# Patient Record
Sex: Female | Born: 1945 | Race: White | Hispanic: No | Smoking: Former smoker
Health system: Southern US, Community
[De-identification: ages and names within clinical notes are randomized; demographics above are authoritative.]

## PROBLEM LIST (undated history)

## (undated) DIAGNOSIS — M1990 Unspecified osteoarthritis, unspecified site: Secondary | ICD-10-CM

## (undated) DIAGNOSIS — I10 Essential (primary) hypertension: Secondary | ICD-10-CM

## (undated) DIAGNOSIS — K589 Irritable bowel syndrome without diarrhea: Secondary | ICD-10-CM

## (undated) DIAGNOSIS — K5790 Diverticulosis of intestine, part unspecified, without perforation or abscess without bleeding: Secondary | ICD-10-CM

## (undated) DIAGNOSIS — F32A Depression, unspecified: Secondary | ICD-10-CM

## (undated) DIAGNOSIS — D126 Benign neoplasm of colon, unspecified: Secondary | ICD-10-CM

## (undated) DIAGNOSIS — F419 Anxiety disorder, unspecified: Secondary | ICD-10-CM

## (undated) DIAGNOSIS — H269 Unspecified cataract: Secondary | ICD-10-CM

## (undated) DIAGNOSIS — E785 Hyperlipidemia, unspecified: Secondary | ICD-10-CM

## (undated) DIAGNOSIS — K219 Gastro-esophageal reflux disease without esophagitis: Secondary | ICD-10-CM

## (undated) DIAGNOSIS — E041 Nontoxic single thyroid nodule: Secondary | ICD-10-CM

## (undated) DIAGNOSIS — D649 Anemia, unspecified: Secondary | ICD-10-CM

## (undated) DIAGNOSIS — K760 Fatty (change of) liver, not elsewhere classified: Secondary | ICD-10-CM

## (undated) DIAGNOSIS — G4730 Sleep apnea, unspecified: Secondary | ICD-10-CM

## (undated) DIAGNOSIS — G2581 Restless legs syndrome: Secondary | ICD-10-CM

## (undated) DIAGNOSIS — R413 Other amnesia: Secondary | ICD-10-CM

## (undated) DIAGNOSIS — F329 Major depressive disorder, single episode, unspecified: Secondary | ICD-10-CM

## (undated) DIAGNOSIS — H18519 Endothelial corneal dystrophy, unspecified eye: Secondary | ICD-10-CM

## (undated) DIAGNOSIS — Z8719 Personal history of other diseases of the digestive system: Secondary | ICD-10-CM

## (undated) DIAGNOSIS — M8580 Other specified disorders of bone density and structure, unspecified site: Secondary | ICD-10-CM

## (undated) HISTORY — DX: Irritable bowel syndrome, unspecified: K58.9

## (undated) HISTORY — DX: Depression, unspecified: F32.A

## (undated) HISTORY — DX: Major depressive disorder, single episode, unspecified: F32.9

## (undated) HISTORY — DX: Nontoxic single thyroid nodule: E04.1

## (undated) HISTORY — PX: POLYPECTOMY: SHX149

## (undated) HISTORY — DX: Other amnesia: R41.3

## (undated) HISTORY — DX: Endothelial corneal dystrophy, unspecified eye: H18.519

## (undated) HISTORY — DX: Gastro-esophageal reflux disease without esophagitis: K21.9

## (undated) HISTORY — DX: Anxiety disorder, unspecified: F41.9

## (undated) HISTORY — DX: Hyperlipidemia, unspecified: E78.5

## (undated) HISTORY — DX: Anemia, unspecified: D64.9

## (undated) HISTORY — DX: Restless legs syndrome: G25.81

## (undated) HISTORY — PX: UTERINE FIBROID EMBOLIZATION: SHX825

## (undated) HISTORY — PX: JOINT REPLACEMENT: SHX530

## (undated) HISTORY — DX: Sleep apnea, unspecified: G47.30

## (undated) HISTORY — DX: Unspecified cataract: H26.9

## (undated) HISTORY — DX: Other specified disorders of bone density and structure, unspecified site: M85.80

## (undated) HISTORY — PX: TONSILLECTOMY: SUR1361

## (undated) HISTORY — DX: Diverticulosis of intestine, part unspecified, without perforation or abscess without bleeding: K57.90

## (undated) HISTORY — DX: Essential (primary) hypertension: I10

## (undated) HISTORY — DX: Fatty (change of) liver, not elsewhere classified: K76.0

## (undated) HISTORY — DX: Benign neoplasm of colon, unspecified: D12.6

---

## 1988-01-08 DIAGNOSIS — D126 Benign neoplasm of colon, unspecified: Secondary | ICD-10-CM

## 1988-01-08 HISTORY — DX: Benign neoplasm of colon, unspecified: D12.6

## 1997-11-03 ENCOUNTER — Encounter: Payer: Self-pay | Admitting: Emergency Medicine

## 1997-11-03 ENCOUNTER — Emergency Department (HOSPITAL_COMMUNITY): Admission: EM | Admit: 1997-11-03 | Discharge: 1997-11-03 | Payer: Self-pay | Admitting: Emergency Medicine

## 1998-08-18 ENCOUNTER — Ambulatory Visit (HOSPITAL_COMMUNITY): Admission: RE | Admit: 1998-08-18 | Discharge: 1998-08-18 | Payer: Self-pay | Admitting: Gastroenterology

## 1998-08-18 ENCOUNTER — Encounter: Payer: Self-pay | Admitting: Gastroenterology

## 1999-05-03 ENCOUNTER — Other Ambulatory Visit: Admission: RE | Admit: 1999-05-03 | Discharge: 1999-05-03 | Payer: Self-pay | Admitting: *Deleted

## 2000-11-04 ENCOUNTER — Encounter (INDEPENDENT_AMBULATORY_CARE_PROVIDER_SITE_OTHER): Payer: Self-pay | Admitting: Specialist

## 2000-11-04 ENCOUNTER — Other Ambulatory Visit: Admission: RE | Admit: 2000-11-04 | Discharge: 2000-11-04 | Payer: Self-pay | Admitting: Gastroenterology

## 2000-11-20 ENCOUNTER — Encounter: Payer: Self-pay | Admitting: Gastroenterology

## 2000-11-20 ENCOUNTER — Ambulatory Visit (HOSPITAL_COMMUNITY): Admission: RE | Admit: 2000-11-20 | Discharge: 2000-11-20 | Payer: Self-pay | Admitting: Gastroenterology

## 2002-03-25 ENCOUNTER — Other Ambulatory Visit: Admission: RE | Admit: 2002-03-25 | Discharge: 2002-03-25 | Payer: Self-pay | Admitting: *Deleted

## 2004-02-02 ENCOUNTER — Ambulatory Visit: Payer: Self-pay | Admitting: Cardiology

## 2004-02-14 ENCOUNTER — Ambulatory Visit: Payer: Self-pay

## 2004-02-14 ENCOUNTER — Ambulatory Visit: Payer: Self-pay | Admitting: Cardiology

## 2004-02-14 ENCOUNTER — Ambulatory Visit: Payer: Self-pay | Admitting: Gastroenterology

## 2004-03-14 ENCOUNTER — Ambulatory Visit: Payer: Self-pay | Admitting: Gastroenterology

## 2004-03-19 ENCOUNTER — Ambulatory Visit: Payer: Self-pay | Admitting: Cardiology

## 2004-03-23 ENCOUNTER — Ambulatory Visit: Payer: Self-pay | Admitting: Cardiology

## 2004-04-06 ENCOUNTER — Ambulatory Visit: Payer: Self-pay | Admitting: Cardiology

## 2004-05-22 ENCOUNTER — Ambulatory Visit: Payer: Self-pay | Admitting: Cardiology

## 2004-09-27 ENCOUNTER — Ambulatory Visit: Payer: Self-pay | Admitting: Pulmonary Disease

## 2004-09-27 ENCOUNTER — Ambulatory Visit: Payer: Self-pay | Admitting: Cardiology

## 2004-10-08 ENCOUNTER — Ambulatory Visit: Payer: Self-pay | Admitting: Cardiology

## 2004-11-03 ENCOUNTER — Ambulatory Visit (HOSPITAL_BASED_OUTPATIENT_CLINIC_OR_DEPARTMENT_OTHER): Admission: RE | Admit: 2004-11-03 | Discharge: 2004-11-03 | Payer: Self-pay | Admitting: Pulmonary Disease

## 2004-11-08 ENCOUNTER — Ambulatory Visit: Payer: Self-pay | Admitting: Pulmonary Disease

## 2004-11-21 ENCOUNTER — Ambulatory Visit: Payer: Self-pay | Admitting: Pulmonary Disease

## 2004-12-10 ENCOUNTER — Ambulatory Visit (HOSPITAL_BASED_OUTPATIENT_CLINIC_OR_DEPARTMENT_OTHER): Admission: RE | Admit: 2004-12-10 | Discharge: 2004-12-10 | Payer: Self-pay | Admitting: Surgery

## 2004-12-10 ENCOUNTER — Encounter (INDEPENDENT_AMBULATORY_CARE_PROVIDER_SITE_OTHER): Payer: Self-pay | Admitting: *Deleted

## 2005-07-04 ENCOUNTER — Ambulatory Visit: Payer: Self-pay | Admitting: Gastroenterology

## 2005-09-04 ENCOUNTER — Encounter: Admission: RE | Admit: 2005-09-04 | Discharge: 2005-09-04 | Payer: Self-pay | Admitting: Family Medicine

## 2005-09-30 ENCOUNTER — Ambulatory Visit: Payer: Self-pay | Admitting: Internal Medicine

## 2005-10-23 ENCOUNTER — Ambulatory Visit: Payer: Self-pay | Admitting: Gastroenterology

## 2005-11-04 ENCOUNTER — Ambulatory Visit: Payer: Self-pay | Admitting: Internal Medicine

## 2006-01-07 LAB — CONVERTED CEMR LAB: Pap Smear: NORMAL

## 2006-01-29 LAB — HM MAMMOGRAPHY: HM Mammogram: NORMAL

## 2006-01-31 ENCOUNTER — Ambulatory Visit: Payer: Self-pay | Admitting: Internal Medicine

## 2006-02-17 ENCOUNTER — Ambulatory Visit: Payer: Self-pay | Admitting: Internal Medicine

## 2006-02-17 LAB — CONVERTED CEMR LAB
ALT: 35 units/L (ref 0–40)
AST: 31 units/L (ref 0–37)
Albumin: 3.8 g/dL (ref 3.5–5.2)
Alkaline Phosphatase: 63 units/L (ref 39–117)
BUN: 7 mg/dL (ref 6–23)
Basophils Absolute: 0 10*3/uL (ref 0.0–0.1)
Basophils Relative: 0.5 % (ref 0.0–1.0)
CO2: 28 meq/L (ref 19–32)
Calcium: 9.1 mg/dL (ref 8.4–10.5)
Chloride: 101 meq/L (ref 96–112)
Cholesterol: 207 mg/dL (ref 0–200)
Creatinine, Ser: 0.7 mg/dL (ref 0.4–1.2)
Direct LDL: 134.8 mg/dL
Eosinophils Absolute: 0.2 10*3/uL (ref 0.0–0.6)
Eosinophils Relative: 3.4 % (ref 0.0–5.0)
GFR calc Af Amer: 109 mL/min
GFR calc non Af Amer: 90 mL/min
Glucose, Bld: 155 mg/dL — ABNORMAL HIGH (ref 70–99)
HCT: 39.7 % (ref 36.0–46.0)
HDL: 42.7 mg/dL (ref >39.0)
Hemoglobin: 13.7 g/dL (ref 12.0–15.0)
Lymphocytes Relative: 36.1 % (ref 12.0–46.0)
MCHC: 34.5 g/dL (ref 30.0–36.0)
MCV: 83 fL (ref 78.0–100.0)
Monocytes Absolute: 0.4 10*3/uL (ref 0.2–0.7)
Monocytes Relative: 5.6 % (ref 3.0–11.0)
Neutro Abs: 3.4 10*3/uL (ref 1.4–7.7)
Neutrophils Relative %: 54.4 % (ref 43.0–77.0)
Platelets: 228 10*3/uL (ref 150–400)
Potassium: 4.4 meq/L (ref 3.5–5.1)
RBC: 4.78 M/uL (ref 3.87–5.11)
RDW: 13.3 % (ref 11.5–14.6)
Sodium: 136 meq/L (ref 135–145)
Total Bilirubin: 0.7 mg/dL (ref 0.3–1.2)
Total CHOL/HDL Ratio: 4.8
Total Protein: 7.4 g/dL (ref 6.0–8.3)
Triglycerides: 196 mg/dL — ABNORMAL HIGH (ref 0–149)
VLDL: 39 mg/dL (ref 0–40)
WBC: 6.3 10*3/uL (ref 4.5–10.5)

## 2006-03-03 ENCOUNTER — Ambulatory Visit: Payer: Self-pay | Admitting: Internal Medicine

## 2006-03-03 LAB — CONVERTED CEMR LAB: Hgb A1c MFr Bld: 7.9 % — ABNORMAL HIGH (ref 4.6–6.0)

## 2006-05-01 ENCOUNTER — Ambulatory Visit: Payer: Self-pay | Admitting: Internal Medicine

## 2006-05-19 DIAGNOSIS — F32A Depression, unspecified: Secondary | ICD-10-CM | POA: Insufficient documentation

## 2006-05-19 DIAGNOSIS — E119 Type 2 diabetes mellitus without complications: Secondary | ICD-10-CM | POA: Insufficient documentation

## 2006-05-19 DIAGNOSIS — G2581 Restless legs syndrome: Secondary | ICD-10-CM | POA: Insufficient documentation

## 2006-05-19 DIAGNOSIS — I10 Essential (primary) hypertension: Secondary | ICD-10-CM | POA: Insufficient documentation

## 2006-05-19 DIAGNOSIS — F329 Major depressive disorder, single episode, unspecified: Secondary | ICD-10-CM

## 2006-05-19 DIAGNOSIS — E785 Hyperlipidemia, unspecified: Secondary | ICD-10-CM | POA: Insufficient documentation

## 2006-05-19 DIAGNOSIS — K589 Irritable bowel syndrome without diarrhea: Secondary | ICD-10-CM | POA: Insufficient documentation

## 2006-05-29 ENCOUNTER — Ambulatory Visit: Payer: Self-pay | Admitting: Internal Medicine

## 2006-05-29 LAB — CONVERTED CEMR LAB: Hgb A1c MFr Bld: 7.2 % — ABNORMAL HIGH (ref 4.6–6.0)

## 2006-06-04 ENCOUNTER — Encounter: Payer: Self-pay | Admitting: Internal Medicine

## 2006-08-22 ENCOUNTER — Ambulatory Visit: Payer: Self-pay | Admitting: Internal Medicine

## 2006-08-22 DIAGNOSIS — D126 Benign neoplasm of colon, unspecified: Secondary | ICD-10-CM | POA: Insufficient documentation

## 2006-10-16 ENCOUNTER — Telehealth (INDEPENDENT_AMBULATORY_CARE_PROVIDER_SITE_OTHER): Payer: Self-pay | Admitting: *Deleted

## 2006-10-17 ENCOUNTER — Ambulatory Visit: Payer: Self-pay | Admitting: Internal Medicine

## 2006-10-17 DIAGNOSIS — B9789 Other viral agents as the cause of diseases classified elsewhere: Secondary | ICD-10-CM | POA: Insufficient documentation

## 2006-10-17 LAB — CONVERTED CEMR LAB
Bilirubin Urine: NEGATIVE
Blood in Urine, dipstick: NEGATIVE
Glucose, Urine, Semiquant: NEGATIVE
Ketones, urine, test strip: NEGATIVE
Nitrite: NEGATIVE
Protein, U semiquant: NEGATIVE
Specific Gravity, Urine: 1.015
Urobilinogen, UA: NEGATIVE
WBC Urine, dipstick: NEGATIVE
pH: 5

## 2006-10-20 ENCOUNTER — Encounter (INDEPENDENT_AMBULATORY_CARE_PROVIDER_SITE_OTHER): Payer: Self-pay | Admitting: *Deleted

## 2006-10-21 ENCOUNTER — Telehealth (INDEPENDENT_AMBULATORY_CARE_PROVIDER_SITE_OTHER): Payer: Self-pay | Admitting: *Deleted

## 2006-10-24 ENCOUNTER — Ambulatory Visit: Payer: Self-pay | Admitting: Internal Medicine

## 2006-11-04 ENCOUNTER — Encounter: Payer: Self-pay | Admitting: Internal Medicine

## 2006-11-10 ENCOUNTER — Telehealth (INDEPENDENT_AMBULATORY_CARE_PROVIDER_SITE_OTHER): Payer: Self-pay | Admitting: *Deleted

## 2006-11-10 LAB — CONVERTED CEMR LAB
ALT: 39 units/L — ABNORMAL HIGH (ref 0–35)
AST: 29 units/L (ref 0–37)
Albumin: 4.5 g/dL (ref 3.5–5.2)
Alkaline Phosphatase: 59 units/L (ref 39–117)
BUN: 8 mg/dL (ref 6–23)
Basophils Absolute: 0 10*3/uL (ref 0.0–0.1)
Basophils Relative: 0 % (ref 0–1)
Bilirubin, Direct: 0.1 mg/dL (ref 0.0–0.3)
Creatinine, Ser: 0.73 mg/dL (ref 0.40–1.20)
Eosinophils Absolute: 0.2 10*3/uL (ref 0.0–0.7)
Eosinophils Relative: 3 % (ref 0–5)
HCT: 42 % (ref 36.0–46.0)
Hemoglobin: 14 g/dL (ref 12.0–15.0)
Hgb A1c MFr Bld: 6.6 % — ABNORMAL HIGH (ref 4.6–6.1)
Indirect Bilirubin: 0.4 mg/dL (ref 0.0–0.9)
Lymphocytes Relative: 40 % (ref 12–46)
Lymphs Abs: 2.8 10*3/uL (ref 0.7–3.3)
MCHC: 33.3 g/dL (ref 30.0–36.0)
MCV: 84.5 fL (ref 78.0–100.0)
Monocytes Absolute: 0.3 10*3/uL (ref 0.2–0.7)
Monocytes Relative: 5 % (ref 3–11)
Neutro Abs: 3.6 10*3/uL (ref 1.7–7.7)
Neutrophils Relative %: 52 % (ref 43–77)
Platelets: 252 10*3/uL (ref 150–400)
RBC: 4.97 M/uL (ref 3.87–5.11)
RDW: 13.5 % (ref 11.5–14.0)
Sed Rate: 22 mm/hr (ref 0–22)
Total Bilirubin: 0.5 mg/dL (ref 0.3–1.2)
Total CK: 55 units/L (ref 7–177)
Total Protein: 7.7 g/dL (ref 6.0–8.3)
WBC: 6.9 10*3/uL (ref 4.0–10.5)

## 2006-12-01 ENCOUNTER — Telehealth (INDEPENDENT_AMBULATORY_CARE_PROVIDER_SITE_OTHER): Payer: Self-pay | Admitting: *Deleted

## 2007-03-10 ENCOUNTER — Telehealth (INDEPENDENT_AMBULATORY_CARE_PROVIDER_SITE_OTHER): Payer: Self-pay | Admitting: *Deleted

## 2007-04-06 ENCOUNTER — Ambulatory Visit: Payer: Self-pay | Admitting: Internal Medicine

## 2007-04-13 ENCOUNTER — Encounter (INDEPENDENT_AMBULATORY_CARE_PROVIDER_SITE_OTHER): Payer: Self-pay | Admitting: *Deleted

## 2007-04-13 LAB — CONVERTED CEMR LAB
ALT: 45 units/L — ABNORMAL HIGH (ref 0–35)
AST: 35 units/L (ref 0–37)
BUN: 7 mg/dL (ref 6–23)
CO2: 29 meq/L (ref 19–32)
Calcium: 9.2 mg/dL (ref 8.4–10.5)
Chloride: 104 meq/L (ref 96–112)
Creatinine, Ser: 0.7 mg/dL (ref 0.4–1.2)
Creatinine,U: 32.6 mg/dL
GFR calc Af Amer: 109 mL/min
GFR calc non Af Amer: 90 mL/min
Glucose, Bld: 119 mg/dL — ABNORMAL HIGH (ref 70–99)
Hgb A1c MFr Bld: 7.2 % — ABNORMAL HIGH (ref 4.6–6.0)
Microalb Creat Ratio: 46 mg/g — ABNORMAL HIGH (ref 0.0–30.0)
Microalb, Ur: 1.5 mg/dL (ref 0.0–1.9)
Potassium: 4.2 meq/L (ref 3.5–5.1)
Sodium: 140 meq/L (ref 135–145)

## 2007-05-20 ENCOUNTER — Telehealth (INDEPENDENT_AMBULATORY_CARE_PROVIDER_SITE_OTHER): Payer: Self-pay | Admitting: *Deleted

## 2007-06-05 ENCOUNTER — Encounter: Payer: Self-pay | Admitting: Internal Medicine

## 2007-06-09 ENCOUNTER — Ambulatory Visit: Payer: Self-pay | Admitting: Internal Medicine

## 2007-06-10 ENCOUNTER — Telehealth (INDEPENDENT_AMBULATORY_CARE_PROVIDER_SITE_OTHER): Payer: Self-pay | Admitting: *Deleted

## 2007-06-11 ENCOUNTER — Encounter (INDEPENDENT_AMBULATORY_CARE_PROVIDER_SITE_OTHER): Payer: Self-pay | Admitting: *Deleted

## 2007-06-11 LAB — CONVERTED CEMR LAB
ALT: 37 units/L — ABNORMAL HIGH (ref 0–35)
AST: 34 units/L (ref 0–37)
Albumin: 3.8 g/dL (ref 3.5–5.2)
Alkaline Phosphatase: 58 units/L (ref 39–117)
Bilirubin, Direct: 0.1 mg/dL (ref 0.0–0.3)
Cholesterol: 215 mg/dL (ref 0–200)
Direct LDL: 132.3 mg/dL
HDL: 43.4 mg/dL (ref >39.0)
Hgb A1c MFr Bld: 6.8 % — ABNORMAL HIGH (ref 4.6–6.0)
TSH: 0.63 microintl units/mL (ref 0.35–5.50)
Total Bilirubin: 1.1 mg/dL (ref 0.3–1.2)
Total CHOL/HDL Ratio: 5
Total Protein: 7.5 g/dL (ref 6.0–8.3)
Triglycerides: 186 mg/dL — ABNORMAL HIGH (ref 0–149)
VLDL: 37 mg/dL (ref 0–40)

## 2007-06-26 ENCOUNTER — Ambulatory Visit: Payer: Self-pay | Admitting: Internal Medicine

## 2007-06-26 ENCOUNTER — Encounter: Payer: Self-pay | Admitting: Internal Medicine

## 2007-07-27 ENCOUNTER — Telehealth (INDEPENDENT_AMBULATORY_CARE_PROVIDER_SITE_OTHER): Payer: Self-pay | Admitting: *Deleted

## 2007-08-21 ENCOUNTER — Telehealth (INDEPENDENT_AMBULATORY_CARE_PROVIDER_SITE_OTHER): Payer: Self-pay | Admitting: *Deleted

## 2007-08-24 ENCOUNTER — Encounter: Payer: Self-pay | Admitting: Internal Medicine

## 2007-10-09 ENCOUNTER — Ambulatory Visit: Payer: Self-pay | Admitting: Internal Medicine

## 2007-10-09 DIAGNOSIS — M8580 Other specified disorders of bone density and structure, unspecified site: Secondary | ICD-10-CM | POA: Insufficient documentation

## 2007-10-15 ENCOUNTER — Encounter (INDEPENDENT_AMBULATORY_CARE_PROVIDER_SITE_OTHER): Payer: Self-pay | Admitting: *Deleted

## 2007-10-15 LAB — CONVERTED CEMR LAB
ALT: 42 units/L — ABNORMAL HIGH (ref 0–35)
AST: 36 units/L (ref 0–37)
BUN: 10 mg/dL (ref 6–23)
CO2: 29 meq/L (ref 19–32)
Calcium: 8.8 mg/dL (ref 8.4–10.5)
Chloride: 103 meq/L (ref 96–112)
Cholesterol: 135 mg/dL (ref 0–200)
Creatinine, Ser: 0.8 mg/dL (ref 0.4–1.2)
GFR calc Af Amer: 93 mL/min
GFR calc non Af Amer: 77 mL/min
Glucose, Bld: 135 mg/dL — ABNORMAL HIGH (ref 70–99)
HDL: 42 mg/dL (ref >39.0)
Hgb A1c MFr Bld: 7 % — ABNORMAL HIGH (ref 4.6–6.0)
LDL Cholesterol: 69 mg/dL (ref 0–99)
Potassium: 4.4 meq/L (ref 3.5–5.1)
Sodium: 139 meq/L (ref 135–145)
Total CHOL/HDL Ratio: 3.2
Triglycerides: 118 mg/dL (ref 0–149)
VLDL: 24 mg/dL (ref 0–40)

## 2007-11-24 ENCOUNTER — Ambulatory Visit: Payer: Self-pay | Admitting: Internal Medicine

## 2007-11-24 LAB — CONVERTED CEMR LAB
Bilirubin Urine: NEGATIVE
Glucose, Urine, Semiquant: NEGATIVE
Ketones, urine, test strip: NEGATIVE
Nitrite: NEGATIVE
Protein, U semiquant: NEGATIVE
Specific Gravity, Urine: 1.01
Urobilinogen, UA: 0.2
pH: 5

## 2007-11-25 ENCOUNTER — Encounter: Payer: Self-pay | Admitting: Internal Medicine

## 2007-11-25 LAB — CONVERTED CEMR LAB: RBC / HPF: NONE SEEN (ref <3)

## 2007-11-26 ENCOUNTER — Encounter: Payer: Self-pay | Admitting: Internal Medicine

## 2007-12-01 ENCOUNTER — Telehealth (INDEPENDENT_AMBULATORY_CARE_PROVIDER_SITE_OTHER): Payer: Self-pay | Admitting: *Deleted

## 2008-02-16 ENCOUNTER — Ambulatory Visit: Payer: Self-pay | Admitting: Internal Medicine

## 2008-02-19 ENCOUNTER — Telehealth (INDEPENDENT_AMBULATORY_CARE_PROVIDER_SITE_OTHER): Payer: Self-pay | Admitting: *Deleted

## 2008-02-19 LAB — CONVERTED CEMR LAB
ALT: 39 units/L — ABNORMAL HIGH (ref 0–35)
AST: 40 units/L — ABNORMAL HIGH (ref 0–37)
Albumin: 4 g/dL (ref 3.5–5.2)
Alkaline Phosphatase: 48 units/L (ref 39–117)
BUN: 8 mg/dL (ref 6–23)
Basophils Absolute: 0.1 10*3/uL (ref 0.0–0.1)
Basophils Relative: 0.9 % (ref 0.0–3.0)
Bilirubin, Direct: 0.1 mg/dL (ref 0.0–0.3)
CO2: 30 meq/L (ref 19–32)
Calcium: 9.5 mg/dL (ref 8.4–10.5)
Chloride: 101 meq/L (ref 96–112)
Creatinine, Ser: 0.8 mg/dL (ref 0.4–1.2)
Creatinine,U: 52.7 mg/dL
Eosinophils Absolute: 0.3 10*3/uL (ref 0.0–0.7)
Eosinophils Relative: 3.3 % (ref 0.0–5.0)
GFR calc Af Amer: 93 mL/min
GFR calc non Af Amer: 77 mL/min
Glucose, Bld: 132 mg/dL — ABNORMAL HIGH (ref 70–99)
HCT: 37.1 % (ref 36.0–46.0)
Hemoglobin: 12.7 g/dL (ref 12.0–15.0)
Hgb A1c MFr Bld: 7.2 % — ABNORMAL HIGH (ref 4.6–6.0)
Lymphocytes Relative: 31.9 % (ref 12.0–46.0)
MCHC: 34.1 g/dL (ref 30.0–36.0)
MCV: 84.1 fL (ref 78.0–100.0)
Microalb Creat Ratio: 7.6 mg/g (ref 0.0–30.0)
Microalb, Ur: 0.4 mg/dL (ref 0.0–1.9)
Monocytes Absolute: 0.4 10*3/uL (ref 0.1–1.0)
Monocytes Relative: 5 % (ref 3.0–12.0)
Neutro Abs: 4.5 10*3/uL (ref 1.4–7.7)
Neutrophils Relative %: 58.9 % (ref 43.0–77.0)
Platelets: 215 10*3/uL (ref 150–400)
Potassium: 4.7 meq/L (ref 3.5–5.1)
RBC: 4.41 M/uL (ref 3.87–5.11)
RDW: 13 % (ref 11.5–14.6)
Sodium: 136 meq/L (ref 135–145)
Total Bilirubin: 0.8 mg/dL (ref 0.3–1.2)
Total Protein: 7.6 g/dL (ref 6.0–8.3)
WBC: 7.8 10*3/uL (ref 4.5–10.5)

## 2008-03-07 ENCOUNTER — Telehealth (INDEPENDENT_AMBULATORY_CARE_PROVIDER_SITE_OTHER): Payer: Self-pay | Admitting: *Deleted

## 2008-03-24 ENCOUNTER — Ambulatory Visit: Payer: Self-pay | Admitting: Internal Medicine

## 2008-06-17 ENCOUNTER — Ambulatory Visit: Payer: Self-pay | Admitting: Internal Medicine

## 2008-06-23 ENCOUNTER — Encounter (INDEPENDENT_AMBULATORY_CARE_PROVIDER_SITE_OTHER): Payer: Self-pay | Admitting: *Deleted

## 2008-06-23 LAB — CONVERTED CEMR LAB
ALT: 36 units/L — ABNORMAL HIGH (ref 0–35)
AST: 30 units/L (ref 0–37)
Hgb A1c MFr Bld: 6.9 % — ABNORMAL HIGH (ref 4.6–6.1)

## 2008-08-25 ENCOUNTER — Encounter: Payer: Self-pay | Admitting: Internal Medicine

## 2008-09-05 ENCOUNTER — Telehealth (INDEPENDENT_AMBULATORY_CARE_PROVIDER_SITE_OTHER): Payer: Self-pay | Admitting: *Deleted

## 2008-10-03 ENCOUNTER — Encounter: Payer: Self-pay | Admitting: Internal Medicine

## 2008-10-21 ENCOUNTER — Ambulatory Visit: Payer: Self-pay | Admitting: Family Medicine

## 2008-11-28 ENCOUNTER — Encounter (INDEPENDENT_AMBULATORY_CARE_PROVIDER_SITE_OTHER): Payer: Self-pay | Admitting: *Deleted

## 2008-12-07 ENCOUNTER — Ambulatory Visit: Payer: Self-pay | Admitting: Internal Medicine

## 2008-12-07 LAB — CONVERTED CEMR LAB
Bilirubin Urine: NEGATIVE
Glucose, Urine, Semiquant: NEGATIVE
Ketones, urine, test strip: NEGATIVE
Nitrite: POSITIVE
Protein, U semiquant: NEGATIVE
Urobilinogen, UA: 0.2
pH: 7

## 2008-12-26 ENCOUNTER — Telehealth (INDEPENDENT_AMBULATORY_CARE_PROVIDER_SITE_OTHER): Payer: Self-pay | Admitting: *Deleted

## 2009-01-24 ENCOUNTER — Ambulatory Visit: Payer: Self-pay | Admitting: Internal Medicine

## 2009-02-01 ENCOUNTER — Ambulatory Visit: Payer: Self-pay | Admitting: Internal Medicine

## 2009-02-01 LAB — CONVERTED CEMR LAB
Bilirubin Urine: NEGATIVE
Blood in Urine, dipstick: NEGATIVE
Glucose, Urine, Semiquant: NEGATIVE
Ketones, urine, test strip: NEGATIVE
Nitrite: NEGATIVE
Protein, U semiquant: NEGATIVE
Specific Gravity, Urine: 1.01
Urobilinogen, UA: 0.2
WBC Urine, dipstick: NEGATIVE
pH: 6

## 2009-02-03 LAB — CONVERTED CEMR LAB
ALT: 29 units/L (ref 0–35)
AST: 27 units/L (ref 0–37)
BUN: 14 mg/dL (ref 6–23)
CO2: 27 meq/L (ref 19–32)
Calcium: 9.4 mg/dL (ref 8.4–10.5)
Chloride: 102 meq/L (ref 96–112)
Cholesterol: 145 mg/dL (ref 0–200)
Creatinine, Ser: 0.77 mg/dL (ref 0.40–1.20)
Glucose, Bld: 168 mg/dL — ABNORMAL HIGH (ref 70–99)
HDL: 47 mg/dL (ref >39)
Hgb A1c MFr Bld: 6.8 % — ABNORMAL HIGH (ref 4.6–6.1)
LDL Cholesterol: 62 mg/dL (ref 0–99)
Microalb, Ur: 0.5 mg/dL (ref 0.00–1.89)
Potassium: 4.7 meq/L (ref 3.5–5.3)
Sodium: 139 meq/L (ref 135–145)
Total CHOL/HDL Ratio: 3.1
Triglycerides: 182 mg/dL — ABNORMAL HIGH (ref <150)
VLDL: 36 mg/dL (ref 0–40)

## 2009-02-16 ENCOUNTER — Encounter (INDEPENDENT_AMBULATORY_CARE_PROVIDER_SITE_OTHER): Payer: Self-pay | Admitting: *Deleted

## 2009-04-03 ENCOUNTER — Encounter (INDEPENDENT_AMBULATORY_CARE_PROVIDER_SITE_OTHER): Payer: Self-pay | Admitting: *Deleted

## 2009-04-04 ENCOUNTER — Ambulatory Visit: Payer: Self-pay | Admitting: Gastroenterology

## 2009-04-04 ENCOUNTER — Encounter (INDEPENDENT_AMBULATORY_CARE_PROVIDER_SITE_OTHER): Payer: Self-pay | Admitting: *Deleted

## 2009-04-05 ENCOUNTER — Telehealth (INDEPENDENT_AMBULATORY_CARE_PROVIDER_SITE_OTHER): Payer: Self-pay | Admitting: *Deleted

## 2009-04-18 ENCOUNTER — Ambulatory Visit: Payer: Self-pay | Admitting: Gastroenterology

## 2009-04-18 LAB — HM COLONOSCOPY

## 2009-04-19 ENCOUNTER — Telehealth: Payer: Self-pay | Admitting: Internal Medicine

## 2009-04-20 ENCOUNTER — Encounter: Payer: Self-pay | Admitting: Gastroenterology

## 2009-05-24 ENCOUNTER — Ambulatory Visit: Payer: Self-pay | Admitting: Internal Medicine

## 2009-05-24 LAB — CONVERTED CEMR LAB: Vit D, 25-Hydroxy: 56 ng/mL (ref 30–89)

## 2009-05-26 LAB — CONVERTED CEMR LAB
ALT: 27 units/L (ref 0–35)
AST: 24 units/L (ref 0–37)
Albumin: 4.1 g/dL (ref 3.5–5.2)
Alkaline Phosphatase: 58 units/L (ref 39–117)
BUN: 15 mg/dL (ref 6–23)
Basophils Absolute: 0 10*3/uL (ref 0.0–0.1)
Basophils Relative: 0.3 % (ref 0.0–3.0)
Bilirubin, Direct: 0.1 mg/dL (ref 0.0–0.3)
CO2: 27 meq/L (ref 19–32)
Calcium: 9.3 mg/dL (ref 8.4–10.5)
Chloride: 103 meq/L (ref 96–112)
Creatinine, Ser: 0.8 mg/dL (ref 0.4–1.2)
Eosinophils Absolute: 0.2 10*3/uL (ref 0.0–0.7)
Eosinophils Relative: 2.5 % (ref 0.0–5.0)
GFR calc non Af Amer: 81.37 mL/min (ref >60)
Glucose, Bld: 163 mg/dL — ABNORMAL HIGH (ref 70–99)
HCT: 36.9 % (ref 36.0–46.0)
Hemoglobin: 12.7 g/dL (ref 12.0–15.0)
Hgb A1c MFr Bld: 6.9 % — ABNORMAL HIGH (ref 4.6–6.5)
Lymphocytes Relative: 23.1 % (ref 12.0–46.0)
Lymphs Abs: 1.9 10*3/uL (ref 0.7–4.0)
MCHC: 34.4 g/dL (ref 30.0–36.0)
MCV: 80.9 fL (ref 78.0–100.0)
Monocytes Absolute: 0.4 10*3/uL (ref 0.1–1.0)
Monocytes Relative: 4.4 % (ref 3.0–12.0)
Neutro Abs: 5.6 10*3/uL (ref 1.4–7.7)
Neutrophils Relative %: 69.7 % (ref 43.0–77.0)
Platelets: 225 10*3/uL (ref 150.0–400.0)
Potassium: 4.6 meq/L (ref 3.5–5.1)
RBC: 4.56 M/uL (ref 3.87–5.11)
RDW: 14.5 % (ref 11.5–14.6)
Sodium: 140 meq/L (ref 135–145)
TSH: 0.45 microintl units/mL (ref 0.35–5.50)
Total Bilirubin: 0.6 mg/dL (ref 0.3–1.2)
Total Protein: 7.6 g/dL (ref 6.0–8.3)
WBC: 8 10*3/uL (ref 4.5–10.5)

## 2009-09-25 ENCOUNTER — Ambulatory Visit: Payer: Self-pay | Admitting: Internal Medicine

## 2009-09-27 ENCOUNTER — Telehealth: Payer: Self-pay | Admitting: Internal Medicine

## 2009-09-28 LAB — CONVERTED CEMR LAB
BUN: 11 mg/dL (ref 6–23)
CO2: 28 meq/L (ref 19–32)
Calcium: 9 mg/dL (ref 8.4–10.5)
Chloride: 102 meq/L (ref 96–112)
Cholesterol: 138 mg/dL (ref 0–200)
Creatinine, Ser: 0.6 mg/dL (ref 0.4–1.2)
GFR calc non Af Amer: 100.92 mL/min (ref >60)
Glucose, Bld: 112 mg/dL — ABNORMAL HIGH (ref 70–99)
HDL: 42.7 mg/dL (ref >39.00)
Hemoglobin: 12.1 g/dL (ref 12.0–15.0)
Hgb A1c MFr Bld: 7.1 % — ABNORMAL HIGH (ref 4.6–6.5)
LDL Cholesterol: 76 mg/dL (ref 0–99)
Potassium: 4.7 meq/L (ref 3.5–5.1)
Sodium: 139 meq/L (ref 135–145)
Total CHOL/HDL Ratio: 3
Triglycerides: 95 mg/dL (ref 0.0–149.0)
VLDL: 19 mg/dL (ref 0.0–40.0)

## 2009-10-03 ENCOUNTER — Telehealth (INDEPENDENT_AMBULATORY_CARE_PROVIDER_SITE_OTHER): Payer: Self-pay | Admitting: *Deleted

## 2009-10-04 ENCOUNTER — Ambulatory Visit: Payer: Self-pay

## 2009-10-04 ENCOUNTER — Encounter (HOSPITAL_COMMUNITY): Admission: RE | Admit: 2009-10-04 | Discharge: 2009-10-13 | Payer: Self-pay | Admitting: Internal Medicine

## 2009-10-04 ENCOUNTER — Encounter: Payer: Self-pay | Admitting: Cardiology

## 2009-10-04 ENCOUNTER — Ambulatory Visit: Payer: Self-pay | Admitting: Cardiology

## 2009-10-19 ENCOUNTER — Encounter: Payer: Self-pay | Admitting: Internal Medicine

## 2009-10-19 ENCOUNTER — Ambulatory Visit: Payer: Self-pay | Admitting: Internal Medicine

## 2009-11-20 ENCOUNTER — Ambulatory Visit: Payer: Self-pay | Admitting: Internal Medicine

## 2010-01-23 ENCOUNTER — Other Ambulatory Visit: Payer: Self-pay | Admitting: Internal Medicine

## 2010-01-23 ENCOUNTER — Ambulatory Visit
Admission: RE | Admit: 2010-01-23 | Discharge: 2010-01-23 | Payer: Self-pay | Source: Home / Self Care | Attending: Internal Medicine | Admitting: Internal Medicine

## 2010-01-24 ENCOUNTER — Telehealth: Payer: Self-pay | Admitting: Internal Medicine

## 2010-01-24 LAB — HEMOGLOBIN A1C: Hgb A1c MFr Bld: 7.2 % — ABNORMAL HIGH (ref 4.6–6.5)

## 2010-02-06 NOTE — Assessment & Plan Note (Signed)
Summary: rto 4 months fasting/cbs   Vital Signs:  Patient profile:   65 year old female Weight:      201.13 pounds Pulse rate:   77 / minute Pulse rhythm:   regular BP sitting:   126 / 76  (left arm) Cuff size:   large  Vitals Entered By: Army Fossa CMA (September 25, 2009 9:17 AM) CC: 4 month f/u- fasting Comments no refills needed will get flu shot at work.   History of Present Illness: ROV CP reports that for the last few weeks  she is having episodes of chest pain,  located in the mid anterior chest, no radiation, at rest, described as pressure/ sharp, lasts few minutes  DIABETES -- no recent ambulatory CBGs , needs Rx for strips    HYPERLIPIDEMIA -- good medication compliance   HYPERTENSION -- no recent ambulatory BPs , but when checked was WNL   ROS No fever or cough No nausea, vomiting, diarrhea, abdominal pain No GERD symptoms No recent upper intrigues No leg pain or swelling reports a negative stress test and echocardiogram few  years ago when she was having palpitations.  Current Medications (verified): 1)  Zocor 20 Mg  Tabs (Simvastatin) .Marland Kitchen.. 1 By Mouth Once Daily 2)  Altace 5 Mg Caps (Ramipril) .... Take 1 Capsule By Mouth Once A Day 3)  Atenolol 50 Mg Tabs (Atenolol) .... Take 1 Tablet Once A Day 4)  Hydrochlorothiazide 25 Mg  Tabs (Hydrochlorothiazide) .... As Needed For Edema (Takes Rarely) 5)  Glucophage 500 Mg  Tabs (Metformin Hcl) .... 2 By Mouth With Breakfast; 2 Tabs By Mouth With Dinner 6)  Cymbalta 60 Mg Cpep (Duloxetine Hcl) .... Take 1 Capsule By Mouth Once A Day 7)  Concerta 54 Mg Cr-Tabs (Methylphenidate Hcl) .Marland Kitchen.. 1 By Mouth Once Daily (Prescribed By Psych) 8)  Requip 1 Mg  Tabs (Ropinirole Hcl) .Marland Kitchen.. 1 By Mouth Qhs 9)  Prilosec 20 Mg  Cpdr (Omeprazole) .Marland Kitchen.. 1 By Mouth Qd 10)  Onetouch Ultra Test  Strp (Glucose Blood) .... Use As Directed 11)  Hyoscyamine Sulfate 0.125 Mg Tbdp (Hyoscyamine Sulfate) .Marland Kitchen.. 1 By Mouth Q 4 Hours As  Needed  Allergies (verified): 1)  * Penicillins Group  Past History:  Past Medical History: Reviewed history from 05/24/2009 and no changes required. DIABETES  DEPRESSION -- sees Dr Benjie Karvonen  HYPERLIPIDEMIA  HYPERTENSION  IBS and dyspepsia SYNDROME, RESTLESS LEGS COLONIC POLYPS  Osteopenia DEXA 06-2007, Rx Ca Vit D  Past Surgical History: Reviewed history from 06/09/2007 and no changes required. fibroid removal (uterus) in the 90s Tonsillectomy  Social History: Reviewed history from 05/24/2009 and no changes required. 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   Chest Wall:  chest wall nontender to palpation Lungs:  normal respiratory effort, no intercostal retractions, no accessory muscle use, and normal breath sounds.   Heart:  normal rate, regular rhythm, no murmur, and no gallop.   Abdomen:  soft, non-tender, no distention, no masses, no guarding, and no rigidity.   Extremities:  no lower extremity edema calves  are symmetric Psych:  Oriented X3, memory intact for recent and remote, normally interactive, good eye contact, not anxious appearing, and not depressed appearing.     Impression & Recommendations:  Problem # 1:  CHEST PAIN (ICD-786.50) Assessment New new onset of chest  pain in a patient with diabetes, high cholesterol, family history heart disease EKG today within normal get a Chest x-ray order a Stress test ER if symptoms severe   Orders: TLB-Hemoglobin (Hgb) (85018-HGB) EKG w/ Interpretation (93000) Specimen Handling (41324) T-2 View CXR (71020TC) Cardiology Referral (Cardiology)  Problem # 2:  DIABETES MELLITUS, TYPE II (ICD-250.00) due for an eye checked  labs Rx for new strips provided  Her updated medication list for this problem includes:    Altace 5 Mg Caps  (Ramipril) .Marland Kitchen... Take 1 capsule by mouth once a day    Glucophage 500 Mg Tabs (Metformin hcl) .Marland Kitchen... 2 by mouth with breakfast; 2 tabs by mouth with dinner    Aspirin 81 Mg Tbec (Aspirin) ..... One daily  Orders: TLB-A1C / Hgb A1C (Glycohemoglobin) (83036-A1C) Specimen Handling (40102)  Problem # 3:  HYPERLIPIDEMIA (ICD-272.4) labs Her updated medication list for this problem includes:    Zocor 20 Mg Tabs (Simvastatin) .Marland Kitchen... 1 by mouth once daily    Labs Reviewed: SGOT: 24 (05/24/2009)   SGPT: 27 (05/24/2009)   HDL:47 (02/01/2009), 42.0 (10/09/2007)  LDL:62 (02/01/2009), 69 (10/09/2007)  Chol:145 (02/01/2009), 135 (10/09/2007)  Trig:182 (02/01/2009), 118 (10/09/2007)  Orders: TLB-Lipid Panel (80061-LIPID)  Problem # 4:  HYPERTENSION (ICD-401.9) at goal  Her updated medication list for this problem includes:    Altace 5 Mg Caps (Ramipril) .Marland Kitchen... Take 1 capsule by mouth once a day    Atenolol 50 Mg Tabs (Atenolol) .Marland Kitchen... Take 1 tablet once a day    Hydrochlorothiazide 25 Mg Tabs (Hydrochlorothiazide) .Marland Kitchen... As needed for edema (takes rarely)    BP today: 126/76 Prior BP: 110/60 (05/24/2009)  Labs Reviewed: K+: 4.6 (05/24/2009) Creat: : 0.8 (05/24/2009)   Chol: 145 (02/01/2009)   HDL: 47 (02/01/2009)   LDL: 62 (02/01/2009)   TG: 182 (02/01/2009)  Orders: Venipuncture (72536) TLB-BMP (Basic Metabolic Panel-BMET) (80048-METABOL) Specimen Handling (64403)  Complete Medication List: 1)  Zocor 20 Mg Tabs (Simvastatin) .Marland Kitchen.. 1 by mouth once daily 2)  Altace 5 Mg Caps (Ramipril) .... Take 1 capsule by mouth once a day 3)  Atenolol 50 Mg Tabs (Atenolol) .... Take 1 tablet once a day 4)  Hydrochlorothiazide 25 Mg Tabs (Hydrochlorothiazide) .... As needed for edema (takes rarely) 5)  Glucophage 500 Mg Tabs (Metformin hcl) .... 2 by mouth with breakfast; 2 tabs by mouth with dinner 6)  Cymbalta 60 Mg Cpep (Duloxetine hcl) .... Take 1 capsule by mouth once a day 7)  Concerta 54 Mg  Cr-tabs (Methylphenidate hcl) .Marland Kitchen.. 1 by mouth once daily (prescribed by psych) 8)  Requip 1 Mg Tabs (Ropinirole hcl) .Marland Kitchen.. 1 by mouth qhs 9)  Prilosec 20 Mg Cpdr (Omeprazole) .Marland Kitchen.. 1 by mouth qd 10)  Onetouch Ultra Test Strp (Glucose blood) .... Use as directed 11)  Hyoscyamine Sulfate 0.125 Mg Tbdp (Hyoscyamine sulfate) .Marland Kitchen.. 1 by mouth q 4 hours as needed 12)  Aspirin 81 Mg Tbec (Aspirin) .... One daily  Patient Instructions: 1)  if you have severe chest pain, go to the ER 2)  Aspirin daily 3)  Please schedule a follow-up appointment in 4 months .  Prescriptions: ONETOUCH ULTRA TEST  STRP (GLUCOSE BLOOD) use as directed  #100 x 2   Entered and Authorized by:   Elita Quick E. Paz MD   Signed by:   [name] MD on 09/25/2009   Method used:   Print then Give to Patient   RxID:   [id number]

## 2010-02-06 NOTE — Progress Notes (Signed)
Summary: Paz--leg crams and potassium/DR PAZ SEE PLEASE  Phone Note Call from Patient Call back at Work Phone [phone]   Caller: Patient Summary of Call: Pt is calling to say she forgot to tell the Dr. that she is having a lot of leg cramp and when they did bloodwork did they check for her potassiumwhen she was in the office yesterday. Initial call taken by: Freddy Jaksch,  June 10, 2007 3:20 PM  Follow-up for Phone Call        Spoke with pt  who had CPX yesterday 6/2/09informed her potassium was not checked with labwork, pt said she forgot to mention she has leg cramps at night occasionally, mentiond to pt can eat bananas, orange juice which are good things for potassium, but will let Dr Drue Novel know about leg cramps and if he has any additional recommendations?--please advise.Kandice Hams  June 10, 2007 3:49 PM  Follow-up by: Kandice Hams,  June 10, 2007 3:49 PM  Additional Follow-up for Phone Call Additional follow up Details #1::        if so desire can incrfease requip to 2 or 3 mgs at night...Marland KitchenMarland KitchenJose E. Paz MD  June 10, 2007 4:48 PM     Additional Follow-up for Phone Call Additional follow up Details #2::    Spoke with pt informed per Dr Drue Novel is she desires can icnreas requip to 2 or 3 mg at night pt says thanks she will try.Kandice Hams  June 10, 2007 5:06 PM  Follow-up by: Kandice Hams,  June 10, 2007 5:06 PM

## 2010-02-06 NOTE — Progress Notes (Signed)
  Phone Note Call from Patient   Caller: Patient Summary of Call: Pt called and left msg has a episode yesterday heart rate went up to 100 with some palpitations which lasted about an hour yesterday.  Feels better now only lasted an hour, pt has an appt sched for January.  Recommend to pt if signs recurs call office also keep a reading of her  blood sugars and bring wit her to appt pt agreed Initial call taken by: Kandice Hams,  December 26, 2008 3:39 PM

## 2010-02-06 NOTE — Progress Notes (Signed)
Summary: [name]  Phone Note Refill Request   Refills Requested: Medication #1:  ALTACE 5 MG CAPS Take 1 capsule by mouth once a day   Notes: Rampiril Generic  Medication #2:  ATENOLOL 50 MG TABS Take 1 tablet once a day [facility]--[id number]  Initial call taken by: [name],  March 07, 2008 8:23 AM      Prescriptions: ATENOLOL 50 MG TABS (ATENOLOL) Take 1 tablet once a day  #90 x 1   Entered by:   [name]   Authorized by:   [name] MD   Signed by:   [name] on 03/07/2008   Method used:   Electronically to        [facility]* (mail-order)             ,          Ph: [phone]       Fax: [phone]   RxID:   [id number] ALTACE 5 MG CAPS (RAMIPRIL) Take 1 capsule by mouth once a day  #90 x 1   Entered by:   [name]   Authorized by:   [name] MD   Signed by:   [name] on 03/07/2008   Method used:   Electronically to        [facility]* (mail-order)             ,          Ph: [phone]       Fax: [phone]   RxID:   [id number]

## 2010-02-06 NOTE — Progress Notes (Signed)
Summary: PAZ-NEW PRSCRIPT FOR PRILOSEC  Medications Added PRILOSEC 20 MG  CPDR (OMEPRAZOLE) 1 by mouth qd       Phone Note Refill Request   Refills Requested: Medication #1:  PRILOSEC 20MG  ONCE A DAY [facility]--[id number]  Initial call taken by: Freddy Jaksch,  December 01, 2006 4:04 PM  Follow-up for Phone Call        Left message with husband to have pt return call.  Prilosec is not on med list or in paper chart ..................................................................Marland KitchenShary Decamp  December 01, 2006 4:46 PM s/w with pt -- pt has been taking otc prilosec - now insurance will pay for rx ..................................................................Marland KitchenShary Decamp  December 01, 2006 4:53 PM     New/Updated Medications: PRILOSEC 20 MG  CPDR (OMEPRAZOLE) 1 by mouth qd   Prescriptions: PRILOSEC 20 MG  CPDR (OMEPRAZOLE) 1 by mouth qd  #30 x 5   Entered by:   Shary Decamp   Authorized by:   [name] MD   Signed by:   Shary Decamp on 12/01/2006   Method used:   Electronically sent to ...       [facility] [address]  [id number]       [address]       [place], [place]  [address]       Ph: [phone] or [phone]       Fax: [phone]   RxID:   [id number]

## 2010-02-06 NOTE — Assessment & Plan Note (Signed)
Summary: 4 MONTH ROA///SPH   Vital Signs:  Patient profile:   65 year old female Height:      66.75 inches Weight:      215.2 pounds BMI:     34.08 Pulse rate:   74 / minute Pulse rhythm:   regular BP sitting:   110 / 70  (left arm) Cuff size:   large  Vitals Entered By: Shary Decamp (June 17, 2008 3:25 PM) CC: ROV   History of Present Illness: ROV --still has a tremor on-off in the hands (R>L) x 1 year, at times interferes w/ her functions; denies > than 1 caffeine drink a day , no herbal suplements. No apparent time relationship w/ the multiple meds she uses  --has "a place"  in the back ( ++ itching) DIABETES -- ambulatory CBGs checked rarely, but usually betwen 100 to 150 HYPERTENSION -- ambulatory BPs  in the 140s    Habits & Providers     Psychiatrist: COTTLE  Current Medications (verified): 1)  Altace 5 Mg Caps (Ramipril) .... Take 1 Capsule By Mouth Once A Day 2)  Atenolol 50 Mg Tabs (Atenolol) .... Take 1 Tablet Once A Day 3)  Cymbalta 60 Mg Cpep (Duloxetine Hcl) .... Take 1 Capsule By Mouth Once A Day 4)  Hydrochlorothiazide 25 Mg  Tabs (Hydrochlorothiazide) .Marland Kitchen.. 1 By Mouth Once Daily 5)  Glucophage 500 Mg  Tabs (Metformin Hcl) .... 2 By Mouth With Breakfast; 2 Tabs By Mouth With Dinner 6)  Requip 1 Mg  Tabs (Ropinirole Hcl) .Marland Kitchen.. 1 By Mouth Qhs 7)  Levsin 0.125 Mg  Tabs (Hyoscyamine Sulfate) .... Prn 8)  Concerta 54 Mg Cr-Tabs (Methylphenidate Hcl) .Marland Kitchen.. 1 By Mouth Once Daily (Prescribed By Psych) 9)  Prilosec 20 Mg  Cpdr (Omeprazole) .Marland Kitchen.. 1 By Mouth Qd 10)  Abilify 15 Mg Tabs (Aripiprazole) .... 1/2 By Mouth Once Daily (Prescribed By Psych) 11)  Grape Seed Extract 100mg  .... Bid 12)  Zocor 20 Mg  Tabs (Simvastatin) .Marland Kitchen.. 1 By Mouth Once Daily 13)  Onetouch Ultra Test  Strp (Glucose Blood) .... Use As Directed 14)  Hyoscyamine Sulfate 0.125 Mg Tbdp (Hyoscyamine Sulfate) .Marland Kitchen.. 1 By Mouth Q 4 Hours As Needed 15)  Vicodin 5-500 Mg Tabs (Hydrocodone-Acetaminophen) .Marland Kitchen..  1 By Mouth Every 6 Hopurs As Needed Pain  Allergies (verified): 1)  * Penicillins Group  Past History:  Past Medical History: DIABETES MELLITUS, TYPE II (ICD-250.00) DEPRESSION -- sees Dr Benjie Karvonen  HYPERLIPIDEMIA  HYPERTENSION  IBS and dyspepsia SYNDROME, RESTLESS LEGS COLONIC POLYPS  Osteopenia DEXA 06-2007, Rx Ca Vit D  Past Surgical History: Reviewed history from 06/09/2007 and no changes required. fibroid removal (uterus) in the 90s Tonsillectomy  Social History: Reviewed history from 06/09/2007 and no changes required. 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   Neurologic:  alert & oriented X3.   no tremor on either hand  Skin:  area of itching in the L upper  back : no mole but  has a skin colored 2 mm elevated lesion (unchanged x years per patient)  Diabetes Management Exam:    Foot Exam (with socks and/or shoes not present):       Sensory-Pinprick/Light touch:  Left medial foot (L-4): normal          Left dorsal foot (L-5): normal          Left lateral foot (S-1): normal          Right medial foot (L-4): normal          Right dorsal foot (L-5): normal          Right lateral foot (S-1): normal       Sensory-Monofilament:          Left foot: normal          Right foot: normal       Inspection:          Left foot: normal          Right foot: normal       Nails:          Left foot: thickened          Right foot: thickened   Impression & Recommendations:  Problem # 1:  TREMOR (ICD-781.0) unclear eyiology, exam today (-) observe  Problem # 2:  skin lesion asked patient tomonitor area and call if changes   Problem # 3:  DIABETES MELLITUS, TYPE II (ICD-250.00)  Her updated medication list for this problem includes:    Altace 5 Mg  Caps (Ramipril) .Marland Kitchen... Take 1 capsule by mouth once a day    Glucophage 500 Mg Tabs (Metformin hcl) .Marland Kitchen... 2 by mouth with breakfast; 2 tabs by mouth with dinner  Orders: Venipuncture ([address])  Labs Reviewed: Creat: 0.8 (02/16/2008)    Reviewed HgBA1c results: 7.2 (02/16/2008)  7.0 (10/09/2007)  Problem # 4:  HYPERLIPIDEMIA (ICD-272.4)  Her updated medication list for this problem includes:    Zocor 20 Mg Tabs (Simvastatin) .Marland Kitchen... 1 by mouth once daily  Labs Reviewed: SGOT: 40 (02/16/2008)   SGPT: 39 (02/16/2008)   HDL:42.0 (10/09/2007), 43.4 (06/09/2007)  LDL:69 (10/09/2007), DEL (60/45/4098)  Chol:135 (10/09/2007), 215 (06/09/2007)  Trig:118 (10/09/2007), 186 (06/09/2007)  Problem # 5:  HYPERTENSION (ICD-401.9) at goal  Her updated medication list for this problem includes:    Altace 5 Mg Caps (Ramipril) .Marland Kitchen... Take 1 capsule by mouth once a day    Atenolol 50 Mg Tabs (Atenolol) .Marland Kitchen... Take 1 tablet once a day    Hydrochlorothiazide 25 Mg Tabs (Hydrochlorothiazide) .Marland Kitchen... As needed for edema (takes rarely)  BP today: 110/70 Prior BP: 120/82 (02/16/2008)  Labs Reviewed: K+: 4.7 (02/16/2008) Creat: : 0.8 (02/16/2008)   Chol: 135 (10/09/2007)   HDL: 42.0 (10/09/2007)   LDL: 69 (10/09/2007)   TG: 118 (10/09/2007)  Complete Medication List: 1)  Zocor 20 Mg Tabs (Simvastatin) .Marland Kitchen.. 1 by mouth once daily 2)  Altace 5 Mg Caps (Ramipril) .... Take 1 capsule by mouth once a day 3)  Atenolol 50 Mg Tabs (Atenolol) .... Take 1 tablet once a day 4)  Hydrochlorothiazide 25 Mg Tabs (Hydrochlorothiazide) .... As needed for edema (takes rarely) 5)  Glucophage 500 Mg Tabs (Metformin hcl) .... 2 by mouth with breakfast; 2 tabs by mouth with dinner 6)  Cymbalta 60 Mg Cpep (Duloxetine hcl) .... Take 1 capsule by mouth once a day 7)  Concerta 54 Mg Cr-tabs (Methylphenidate hcl) .Marland Kitchen.. 1 by mouth once daily (prescribed by psych) 8)  Abilify 15 Mg Tabs (Aripiprazole) .... 1/2 by mouth once daily (prescribed  by psych) 9)  Requip 1 Mg Tabs (Ropinirole hcl) .Marland Kitchen.. 1 by mouth qhs 10)  Levsin 0.125 Mg Tabs (Hyoscyamine sulfate) .Marland KitchenMarland KitchenMarland Kitchen  Prn 11)  Prilosec 20 Mg Cpdr (Omeprazole) .Marland Kitchen.. 1 by mouth qd 12)  Grape Seed Extract 100mg   .... Bid 13)  Onetouch Ultra Test Strp (Glucose blood) .... Use as directed 14)  Hyoscyamine Sulfate 0.125 Mg Tbdp (Hyoscyamine sulfate) .Marland Kitchen.. 1 by mouth q 4 hours as needed 15)  Vicodin 5-500 Mg Tabs (Hydrocodone-acetaminophen) .Marland Kitchen.. 1 by mouth every 6 hopurs as needed pain  Patient Instructions: 1)  Please schedule a follow-up appointment in 3 to 4  months .    Habits & Providers     Psychiatrist: COTTLE

## 2010-02-06 NOTE — Assessment & Plan Note (Signed)
Summary: MED REFILL/CBS   Vital Signs:  Patient Profile:   65 Years Old Female Weight:      208.4 pounds Pulse rate:   70 / minute BP sitting:   140 / 88  Vitals Entered By: Shary Decamp (April 06, 2007 11:46 AM)                 Chief Complaint:  rov .  History of Present Illness: ROV non fasting  DIABETES MELLITUS-- not checking CBGs lately DEPRESSION --stable, sees psych HYPERLIPIDEMIA--on no med HYPERTENSION--occ checks <140/80     Updated Prior Medication List: ALTACE 5 MG CAPS (RAMIPRIL) Take 1 capsule by mouth once a day ATENOLOL 50 MG TABS (ATENOLOL) Take 1 tablet once a day CYMBALTA 60 MG CPEP (DULOXETINE HCL) Take 1 capsule by mouth once a day HYDROCHLOROTHIAZIDE 25 MG  TABS (HYDROCHLOROTHIAZIDE) prn GLUCOPHAGE 500 MG  TABS (METFORMIN HCL) 1 by mouth with breakfast; 2 tabs by mouth with dinner REQUIP 1 MG  TABS (ROPINIROLE HCL) 1 by mouth qhs LEVSIN 0.125 MG  TABS (HYOSCYAMINE SULFATE) prn CONCERTA 36 MG  TBCR (METHYLPHENIDATE HCL) 1 by mouth qd PRILOSEC 20 MG  CPDR (OMEPRAZOLE) 1 by mouth qd ABILIFY 5 MG  TABS (ARIPIPRAZOLE) 1 by mouth qd * GRAPE SEED EXTRACT 100MG  bid  Current Allergies (reviewed today): * PENICILLINS GROUP  Past Medical History:        DIABETES MELLITUS, TYPE II (ICD-250.00)    DEPRESSION (ICD-311)    HYPERLIPIDEMIA (ICD-272.4)    HYPERTENSION (ICD-401.9)    IBS (ICD-564.1)    SYNDROME, RESTLESS LEGS (ICD-333.94)    DYSPEPSIA (ICD-536.8)    FAMILY HISTORY OF COLON CA 1ST DEGREE RELATIVE <60 (ICD-V16.0)    COLONIC POLYPS (ICD-211.3)       Family History:    mother had lung cancer.  She was a smoker.    + ovarian cancer.    Father had colon cancer    no history of breast cancer.    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      occ edema, takes HCTZ PRN  Resp      Denies shortness of breath.      occ cough   Physical Exam  General:     alert, well-developed, and well-nourished.   Lungs:     normal respiratory effort, no intercostal retractions, no accessory muscle use, and normal breath sounds.   Heart:     normal rate, regular rhythm, and no murmur.   Extremities:     no edema    Impression & Recommendations:  Problem # 1:  DIABETES MELLITUS, TYPE II (ICD-250.00)  Her updated medication list for this problem includes:    Altace 5 Mg Caps (Ramipril) .Marland Kitchen... Take 1 capsule by mouth once a day    Glucophage 500 Mg Tabs (Metformin hcl) .Marland Kitchen... 1 by mouth with breakfast; 2 tabs by mouth with dinner  Labs Reviewed: HgBA1c: 6.6 (10/24/2006)   Creat: 0.73 (10/24/2006)     Orders: TLB-A1C / Hgb A1C (Glycohemoglobin) (83036-A1C) TLB-Microalbumin/Creat Ratio, Urine (82043-MALB) TLB-ALT (SGPT) (84460-ALT) TLB-AST (SGOT) (84450-SGOT)   Problem # 2:  HYPERLIPIDEMIA (ICD-272.4) on no meds Labs Reviewed: Chol: 207 (02/17/2006)  HDL: 42.7 (02/17/2006)   LDL: DEL (02/17/2006)   TG: 196 (02/17/2006) SGOT: 29 (10/24/2006)   SGPT: 39 (10/24/2006)   Problem # 3:  HYPERTENSION (ICD-401.9) likes to change HCTZ to once daily --ok Her updated medication list for this problem includes:    Altace 5 Mg Caps (Ramipril) .Marland Kitchen... Take 1 capsule by mouth once a day    Atenolol 50 Mg Tabs (Atenolol) .Marland Kitchen... Take 1 tablet once a day    Hydrochlorothiazide 25 Mg Tabs (Hydrochlorothiazide) .Marland Kitchen... 1 by mouth once daily  Orders: TLB-BMP (Basic Metabolic Panel-BMET) (80048-METABOL) Venipuncture ([address])   Complete Medication List: 1)  Altace 5 Mg Caps (Ramipril) .... Take 1 capsule by mouth once a day 2)  Atenolol 50 Mg Tabs (Atenolol) .... Take 1 tablet once a day 3)  Cymbalta 60 Mg Cpep (Duloxetine hcl) .... Take 1 capsule by mouth once a day 4)  Hydrochlorothiazide 25 Mg Tabs  (Hydrochlorothiazide) .Marland Kitchen.. 1 by mouth once daily 5)  Glucophage 500 Mg Tabs (Metformin hcl) .Marland Kitchen.. 1 by mouth with breakfast; 2 tabs by mouth with dinner 6)  Requip 1 Mg Tabs (Ropinirole hcl) .Marland Kitchen.. 1 by mouth qhs 7)  Levsin 0.125 Mg Tabs (Hyoscyamine sulfate) .... Prn 8)  Concerta 36 Mg Tbcr (Methylphenidate hcl) .Marland Kitchen.. 1 by mouth qd 9)  Prilosec 20 Mg Cpdr (Omeprazole) .Marland Kitchen.. 1 by mouth qd 10)  Abilify 5 Mg Tabs (Aripiprazole) .Marland Kitchen.. 1 by mouth qd 11)  Grape Seed Extract 100mg   .... Bid   Patient Instructions: 1)  Please schedule a follow-up appointment in 3 to 4 months.Fasting for a yearly check    ]

## 2010-02-06 NOTE — Letter (Signed)
Summary: DM EYE CHECK  (-)    [facility]  [facility]   Imported By: Freddy Jaksch 11/21/2006 14:34:32  _____________________________________________________________________  External Attachment:    Type:   Image     Comment:   External Document

## 2010-02-06 NOTE — Assessment & Plan Note (Signed)
Summary: cpx/ns/kdc   Vital Signs:  Patient profile:   65 year old female Height:      66.75 inches Weight:      205.8 pounds Pulse rate:   70 / minute BP sitting:   110 / 60  Vitals Entered By: Shary Decamp (May 24, 2009 8:55 AM) CC: cpx, not fasting Is Patient Diabetic? Yes Comments  - pt has not seen gyn since 2008   - MMG up-to-date, per pt she had one this past year Shary Decamp  May 24, 2009 9:02 AM    History of Present Illness: CPX--- chart reviewed   additoinally, we are evaluating her chronic medical issues   DIABETES-- ambulatory CBGs < 150 most of the time  DEPRESSION -- sees Dr Benjie Karvonen routinely  HYPERLIPIDEMIA -- good medication compliance until recently , missed zocor x 2 last weeks  HYPERTENSION --  ambulatory BPs in the 140/78  Osteopenia -- exercising little x last month, plans to go back to exercise, + Ca and vit d   Current Medications (verified): 1)  Zocor 20 Mg  Tabs (Simvastatin) .Marland Kitchen.. 1 By Mouth Once Daily 2)  Altace 5 Mg Caps (Ramipril) .... Take 1 Capsule By Mouth Once A Day 3)  Atenolol 50 Mg Tabs (Atenolol) .... Take 1 Tablet Once A Day 4)  Hydrochlorothiazide 25 Mg  Tabs (Hydrochlorothiazide) .... As Needed For Edema (Takes Rarely) 5)  Glucophage 500 Mg  Tabs (Metformin Hcl) .... 2 By Mouth With Breakfast; 2 Tabs By Mouth With Dinner 6)  Cymbalta 60 Mg Cpep (Duloxetine Hcl) .... Take 1 Capsule By Mouth Once A Day 7)  Concerta 54 Mg Cr-Tabs (Methylphenidate Hcl) .Marland Kitchen.. 1 By Mouth Once Daily (Prescribed By Psych) 8)  Abilify 5 Mg Tabs (Aripiprazole) .Marland Kitchen.. 1 By Mouth Once Daily (Rx'd By Psych) 9)  Requip 1 Mg  Tabs (Ropinirole Hcl) .Marland Kitchen.. 1 By Mouth Qhs 10)  Prilosec 20 Mg  Cpdr (Omeprazole) .Marland Kitchen.. 1 By Mouth Qd 11)  Onetouch Ultra Test  Strp (Glucose Blood) .... Use As Directed 12)  Hyoscyamine Sulfate 0.125 Mg Tbdp (Hyoscyamine Sulfate) .Marland Kitchen.. 1 By Mouth Q 4 Hours As Needed 13)  Vicodin 5-500 Mg Tabs (Hydrocodone-Acetaminophen) .Marland Kitchen.. 1 By Mouth Every 6  Hopurs As Needed Pain  Allergies (verified): 1)  * Penicillins Group  Past History:  Past Medical History: DIABETES  DEPRESSION -- sees Dr Benjie Karvonen  HYPERLIPIDEMIA  HYPERTENSION  IBS and dyspepsia SYNDROME, RESTLESS LEGS COLONIC POLYPS  Osteopenia DEXA 06-2007, Rx Ca Vit D  Past Surgical History: Reviewed history from 06/09/2007 and no changes required. fibroid removal (uterus) in the 90s Tonsillectomy  Family History: Reviewed history from 06/09/2007 and no changes required. lung cancer--mother She was a smoker. + ovarian cancer ?paternal aunt breast cancer--no Diabetes runs in the family Colon CA --father MI- GM in her 48s  Social History: Reviewed history from 06/09/2007 and no changes required. Married three children patient is a retired Engineer, civil (consulting) husband was  ill, doing pretty well now (he is my patient) tobacco-- quit in 1977 ETOH-- rarely   Review of Systems CV:  Denies chest pain or discomfort and swelling of feet; occasionally palpitations without any associated symptoms. GI:  Denies bloody stools, diarrhea, nausea, and vomiting. GU:  Denies dysuria and hematuria. Neuro:  for the last few months, she has noted dizziness  from time to time, mostly associated when she stands  up quickly, sx  last few seconds No associated syncope, chest pain, palpitations. Denies a slurred speech or  focal deficits Sometimes she also feels dizzy when she goes up the stairs. Psych:  emotionally feeling well, recently she self discontinued Abilify because didn't like  the way she feels with that.  Physical Exam  General:  alert, well-nourished, and overweight-appearing.   Neck:  full ROM, no masses, no thyromegaly, and normal carotid upstroke.   Lungs:  normal respiratory effort, no intercostal retractions, no accessory muscle use, and normal breath sounds.   Heart:  normal rate, regular rhythm, no murmur, and no gallop.   Abdomen:  soft, non-tender, no distention, no masses, no  guarding, and no rigidity.   Extremities:  no pretibial edema bilaterally  Neurologic:  alert & oriented X3, cranial nerves II-XII intact, strength normal in all extremities, and gait normal.   Psych:  Cognition and judgment appear intact. Alert and cooperative with normal attention span and concentration.     Impression & Recommendations:  Problem # 1:  HEALTH SCREENING (ICD-V70.0)  Td? approximately 2005 the patient Pneumovax (06/09/2007) she had a shingles shot in the past     has not seen gynecology  in a  while-- encouraged to see gyn , needs a PAP, breast exam Pap Smear:  normal (01/07/2006) last mammogram 9- 2010, normal  + FH colon cancer Colonoscopy:  DONE (04/18/2009)--next 2016  printed material provided regards diet  and exercise  Orders: Venipuncture (60454) TLB-TSH (Thyroid Stimulating Hormone) (84443-TSH) TLB-CBC Platelet - w/Differential (85025-CBCD) T-Vitamin D (25-Hydroxy) (09811-91478) TLB-BMP (Basic Metabolic Panel-BMET) (80048-METABOL)  Problem # 2:  OSTEOPENIA (ICD-733.90) bone density test 06/2007 ---> osteopenia, T score was -1.1 continue with calcium, vitamin D Check a bone density this later on this year  Problem # 3:  DIZZINESS (ICD-780.4) Assessment: New dizziness as described in the review of systems Symptoms going on for few months, neurovascular exam negative. Related to Abilify? pt  just self discontinued it, we'll see how she does in the near future, she will let me know if symptoms increase  Problem # 4:  DIABETES MELLITUS, TYPE II (ICD-250.00)  labs Her updated medication list for this problem includes:    Altace 5 Mg Caps (Ramipril) .Marland Kitchen... Take 1 capsule by mouth once a day    Glucophage 500 Mg Tabs (Metformin hcl) .Marland Kitchen... 2 by mouth with breakfast; 2 tabs by mouth with dinner    Labs Reviewed: Creat: 0.77 (02/01/2009)     Last Eye Exam: normal (08/25/2008) Reviewed HgBA1c results: 6.8 (02/01/2009)  6.9  (06/17/2008)  Orders: TLB-A1C / Hgb A1C (Glycohemoglobin) (83036-A1C)  Problem # 5:  HYPERLIPIDEMIA (ICD-272.4)  off Zocor for a couple of weeks, encourage good compliance, FLP on return to the office Her updated medication list for this problem includes:    Zocor 20 Mg Tabs (Simvastatin) .Marland Kitchen... 1 by mouth once daily    Labs Reviewed: SGOT: 27 (02/01/2009)   SGPT: 29 (02/01/2009)   HDL:47 (02/01/2009), 42.0 (10/09/2007)  LDL:62 (02/01/2009), 69 (10/09/2007)  Chol:145 (02/01/2009), 135 (10/09/2007)  Trig:182 (02/01/2009), 118 (10/09/2007)  Orders: TLB-Hepatic/Liver Function Pnl (80076-HEPATIC)  Problem # 6:  DEPRESSION (ICD-311) symptoms well controlled, self discontinued Abilify Her updated medication list for this problem includes:    Cymbalta 60 Mg Cpep (Duloxetine hcl) .Marland Kitchen... Take 1 capsule by mouth once a day  Complete Medication List: 1)  Zocor 20 Mg Tabs (Simvastatin) .Marland Kitchen.. 1 by mouth once daily 2)  Altace 5 Mg Caps (Ramipril) .... Take 1 capsule by mouth once a day 3)  Atenolol 50 Mg Tabs (Atenolol) .... Take 1 tablet once  a day 4)  Hydrochlorothiazide 25 Mg Tabs (Hydrochlorothiazide) .... As needed for edema (takes rarely) 5)  Glucophage 500 Mg Tabs (Metformin hcl) .... 2 by mouth with breakfast; 2 tabs by mouth with dinner 6)  Cymbalta 60 Mg Cpep (Duloxetine hcl) .... Take 1 capsule by mouth once a day 7)  Concerta 54 Mg Cr-tabs (Methylphenidate hcl) .Marland Kitchen.. 1 by mouth once daily (prescribed by psych) 8)  Requip 1 Mg Tabs (Ropinirole hcl) .Marland Kitchen.. 1 by mouth qhs 9)  Prilosec 20 Mg Cpdr (Omeprazole) .Marland Kitchen.. 1 by mouth qd 10)  Onetouch Ultra Test Strp (Glucose blood) .... Use as directed 11)  Hyoscyamine Sulfate 0.125 Mg Tbdp (Hyoscyamine sulfate) .Marland Kitchen.. 1 by mouth q 4 hours as needed 12)  Vicodin 5-500 Mg Tabs (Hydrocodone-acetaminophen) .Marland Kitchen.. 1 by mouth every 6 hopurs as needed pain  Patient Instructions: 1)  Please schedule a follow-up appointment in 4 months , fasting for a routine  office visit   Preventive Care Screening  Prior Values:    Pap Smear:  normal (01/07/2006)    Mammogram:  Normal (01/29/2006)    Colonoscopy:  DONE (04/18/2009)    Last Flu Shot:  had flu shot at work (01/24/2009)    Last Pneumovax:  Pneumovax (06/09/2007)    Past Medical History:    Reviewed history from 01/24/2009 and no changes required:       DIABETES        DEPRESSION -- sees Dr Benjie Karvonen        HYPERLIPIDEMIA        HYPERTENSION        IBS and dyspepsia       SYNDROME, RESTLESS LEGS       COLONIC POLYPS        Osteopenia DEXA 06-2007, Rx Ca Vit D  Past Surgical History:    Reviewed history from 06/09/2007 and no changes required:       fibroid removal (uterus) in the 90s       Tonsillectomy

## 2010-02-06 NOTE — Assessment & Plan Note (Signed)
Summary: fever,shaky//tl   Vital Signs:  Patient Profile:   65 Years Old Female Weight:      209.6 pounds Temp:     98.6 degrees F oral BP sitting:   140 / 90  Vitals Entered By: Shary Decamp (October 24, 2006 1:08 PM)                 Chief Complaint:  still not feeling well, + fever, still with back pain (left) feels achy, and + cough x today.  History of Present Illness: since the last office visit she felt some better but still it is not completely well for the last 3 days it is running low grade fevers up to 99.8 she " feels bad" : weakness, fatigue, achiness mostly around her shoulders. Two days ago she got very anxious and she started crying, she feel better emotionally after the crying   still has occasionally low back pain with certain movements  Current Allergies (reviewed today): * PENICILLINS GROUP Updated/Current Medications (including changes made in today's visit):  ALTACE 5 MG CAPS (RAMIPRIL) Take 1 capsule by mouth once a day ATENOLOL 50 MG TABS (ATENOLOL) Take 1 tablet once a day HYOSCYAMINE SULFATE 0.125 MG SUBL (HYOSCYAMINE SULFATE)  CYMBALTA 60 MG CPEP (DULOXETINE HCL) Take 1 capsule by mouth once a day HYDROCHLOROTHIAZIDE 25 MG  TABS (HYDROCHLOROTHIAZIDE) prn GLUCOPHAGE 500 MG  TABS (METFORMIN HCL) 1 by mouth with breakfast; 2 tabs by mouth with dinner REQUIP 1 MG  TABS (ROPINIROLE HCL) 1 by mouth qhs LEVSIN 0.125 MG  TABS (HYOSCYAMINE SULFATE) prn ZANTAC 150 MG  CAPS (RANITIDINE HCL)  CONCERTA 54 MG  TBCR (METHYLPHENIDATE HCL) qd      Review of Systems  ENT      Denies sore throat.      sinus pain--no sinus congestion--no  Resp      Denies sputum productive.      cough little  GI      Denies bloody stools, nausea, and vomiting.      diarrhea today  GU      Denies dysuria, hematuria, and urinary frequency.  Psych      depression: well control in general   Physical Exam  General:     alert, well-developed, and well-nourished.    Head:     no tenderness in the sinus area Eyes:     nl. ,  no conjunctiva lesions Ears:     normal Nose:     normal Mouth:     no redness or discharge Lungs:     normal respiratory effort, no intercostal retractions, and no accessory muscle use.   Heart:     normal rate, regular rhythm, and no murmur.   Abdomen:     soft and non-tender.   Msk:     not tender in the spine Extremities:     no edema    Impression & Recommendations:  Problem # 1:  "not feeling well" unclear etiology she has low-grade fevers: this  still could be a viral syndrome, other etiol. have to  be a rule-out, PMR? labs, CXR,UCX  Problem # 2:  FEVER (ICD-780.6) see above Orders: Venipuncture (81191) CXR- 2view (CXR)   Problem # 3:  DIABETES MELLITUS, TYPE II (ICD-250.00)  Her updated medication list for this problem includes:    Altace 5 Mg Caps (Ramipril) .Marland Kitchen... Take 1 capsule by mouth once a day    Glucophage 500 Mg Tabs (Metformin hcl) .Marland Kitchen... 1 by mouth with breakfast;  2 tabs by mouth with dinner  Labs Reviewed: HgBA1c: 7.2 (05/29/2006)   Creat: 0.7 (02/17/2006)      Complete Medication List: 1)  Altace 5 Mg Caps (Ramipril) .... Take 1 capsule by mouth once a day 2)  Atenolol 50 Mg Tabs (Atenolol) .... Take 1 tablet once a day 3)  Hyoscyamine Sulfate 0.125 Mg Subl (Hyoscyamine sulfate) 4)  Cymbalta 60 Mg Cpep (Duloxetine hcl) .... Take 1 capsule by mouth once a day 5)  Hydrochlorothiazide 25 Mg Tabs (Hydrochlorothiazide) .... Prn 6)  Glucophage 500 Mg Tabs (Metformin hcl) .Marland Kitchen.. 1 by mouth with breakfast; 2 tabs by mouth with dinner 7)  Requip 1 Mg Tabs (Ropinirole hcl) .Marland Kitchen.. 1 by mouth qhs 8)  Levsin 0.125 Mg Tabs (Hyoscyamine sulfate) .... Prn 9)  Zantac 150 Mg Caps (Ranitidine hcl) 10)  Concerta 54 Mg Tbcr (Methylphenidate hcl) .... Qd  Other Orders: T-Culture, Urine ([id number])     ]

## 2010-02-06 NOTE — Progress Notes (Signed)
Summary: lab results  Phone Note Outgoing Call Call back at Work Phone [phone]   Details for Reason: urine culture results: advice patient, urine culture negative.   plan the same   Signed by [facility] E. Paz MD on 11/28/2007 at 12:32 PM Summary of Call: discussed with patient .......Marland KitchenShary Decamp  December 01, 2007 11:51 AM

## 2010-02-06 NOTE — Letter (Signed)
Summary: Primary Care Appointment Letter  [facility] [place]  [address] [place], [place] [address]   Phone: [phone]  Fax: [phone]    11/28/2008 MRN: [id number]  [name] [address] [place], [place]  [address]  Dear Ms. Clowdus,   Your Primary Care Physician [place] E. Paz MD has indicated that:    ____x___it is time to schedule an appointment.  Please call our office @ [phone] to schedule an office visit with Dr. Drue Novel.    _______you missed your appointment on______ and need to call and          reschedule.    _______you need to have lab work done.    _______you need to schedule an appointment discuss lab or test results.    _______you need to call to reschedule your appointment that is                       scheduled on _________.     Please call our office as soon as possible. Our phone number is [phone]          _________. Please press option 1. Our office is open 8a-12noon and 1p-5p, Monday through Friday.     Thank you,    [facility]

## 2010-02-06 NOTE — Progress Notes (Signed)
Summary: called pt re: ekg  Phone Note Outgoing Call   Summary of Call: CALLED PT....PER NURSE- EKG WAS NOT SCANNED.Marland KitchenMarland KitchenNEED PT TO REPEAT THE EKG...LEFT MESSAGE FOR PT TO CALL.Marland KitchenDaine Gip  August 21, 2007 1:05 PM Initial call taken by: Daine Gip,  August 21, 2007 1:05 PM  Follow-up for Phone Call        PT CAME FOR THE EKG....PER STACIA.Daine Gip  August 26, 2007 9:55 AM Follow-up by: Daine Gip,  August 26, 2007 9:55 AM

## 2010-02-06 NOTE — Letter (Signed)
Summary: Cancer Screening/[facility] Personalized Risk Profile  Cancer Screening/[facility] Personalized Risk Profile   Imported By: Lanelle Bal 05/31/2009 09:18:52  _____________________________________________________________________  External Attachment:    Type:   Image     Comment:   External Document

## 2010-02-06 NOTE — Progress Notes (Signed)
Summary: nuc pre procedure  Phone Note Outgoing Call Call back at [phone]   Call placed by: Cathlyn Parsons RN,  October 03, 2009 3:42 PM Call placed to: Patient Summary of Call: Reviewed information on Myoview Information Sheet (see scanned document for further details). Spoke with patient.     Nuclear Med Background Indications for Stress Test: Evaluation for Ischemia     Symptoms: Chest Pain, Chest Pressure  Symptoms Comments: Sharp CP   Nuclear Pre-Procedure Cardiac Risk Factors: History of Smoking, Hypertension, Lipids, NIDDM Height (in): 66.75

## 2010-02-06 NOTE — Assessment & Plan Note (Signed)
Summary: Cardiology Nuclear Testing  Nuclear Med Background Indications for Stress Test: Evaluation for Ischemia   History: Myocardial Perfusion Study  History Comments: '01 [id number]  Symptoms: Chest Pain, Chest Pressure, DOE, Fatigue, Palpitations  Symptoms Comments: Last episode of CP:2 days ago.   Nuclear Pre-Procedure Cardiac Risk Factors: History of Smoking, Hypertension, Lipids, NIDDM, Obesity Caffeine/Decaff Intake: none NPO After: 9:30 PM Lungs: Clear IV 0.9% NS with Angio Cath: 22g     IV Site: R Hand IV Started by: Cathlyn Parsons, RN Chest Size (in) 42     Cup Size C     Height (in): 66.75 Weight (lb): 196 BMI: 31.04 Tech Comments: Atenolol held x 36 hours.  Nuclear Med Study 1 or 2 day study:  1 day     Stress Test Type:  Stress Reading MD:  Willa Rough, MD     Referring MD:  Willow Ora, MD Resting Radionuclide:  Technetium 49m Tetrofosmin     Resting Radionuclide Dose:  11.0 mCi  Stress Radionuclide:  Technetium 35m Tetrofosmin     Stress Radionuclide Dose:  33.0 mCi   Stress Protocol Exercise Time (min):  5:01 min     Max HR:  162 bpm     Predicted Max HR:  156 bpm  Max Systolic BP: 188 mm Hg     Percent Max HR:  103.85 %     METS: 7.0 Rate Pressure Product:  10272    Stress Test Technologist:  [employer], CMA-N     Nuclear Technologist:  Domenic Polite, CNMT  Rest Procedure  Myocardial perfusion imaging was performed at rest 45 minutes following the intravenous administration of Technetium 51m Tetrofosmin.  Stress Procedure  The patient exercised for 5:01.  The patient stopped due to fatigue and denied any significant chest pain.  She did c/o a very brief episode of chest tightness 2-minutes into recovery.  There were no diagnostic ST-T wave changes with exercise, there were nonspecific T-wave changes in recovery with occasional PJC's and PAC's.  Technetium 87m Tetrofosmin was injected at peak exercise and myocardial perfusion imaging was performed  after a brief delay.  QPS Raw Data Images:  Patient motion noted; appropriate software correction applied. Stress Images:  Normal homogeneous uptake in all areas of the myocardium. Rest Images:  Normal homogeneous uptake in all areas of the myocardium. Subtraction (SDS):  No evidence of ischemia. Transient Ischemic Dilatation:  .89  (Normal <1.22)  Lung/Heart Ratio:  .43  (Normal <0.45)  Quantitative Gated Spect Images QGS EDV:  59 ml QGS ESV:  9 ml QGS EF:  84 % QGS cine images:  Normal motion  Findings Normal nuclear study      Overall Impression  Exercise Capacity: Fair exercise capacity. BP Response: Normal blood pressure response. Clinical Symptoms: Legs tired ECG Impression: No significant ST segment change suggestive of ischemia. Overall Impression: Normal stress nuclear study.  Appended Document: Cardiology Nuclear Testing advised patient Stress test negative To call if chest pain persists ER if symptoms severe    Appended Document: Cardiology Nuclear Testing Patient spouse notified.

## 2010-02-06 NOTE — Progress Notes (Signed)
Summary: MYOVIEW REFERRAL NEEDS PEER TO PEER  Phone Note Outgoing Call   Call placed by: Magdalen Spatz [facility],  September 27, 2009 9:19 AM Call placed to: Database administrator of Call: IN REFERENCE TO THE MYOVIEW STRESS TEST ORDERED, IT WAS NOT APPROVED BY [employer] AT THE CLINICAL LEVEL, DID NOT MEET REQUIREMENTS FOR APPROVAL.  CASE HAS BEEN SENT TO PHYSICIAN REVIEW.  IT WILL BE ON HOLDING STATUS FOR ONLY 3 DAYS.  THEY REQUEST THAT DR. PAZ CALL [phone], PRESS OPTION 4, AND GIVE CASE# [id number], FOR PEER TO PEER REVIEW.   Initial call taken by: Magdalen Spatz [facility],  September 27, 2009 9:19 AM  Follow-up for Phone Call        test approved, code: [id number] Follow-up by: [facility] E. Paz MD,  September 27, 2009 3:41 PM

## 2010-02-06 NOTE — Miscellaneous (Signed)
Summary: [facility]  Clinical Lists Changes  Medications: Added new medication of MOVIPREP 100 GM  SOLR (PEG-KCL-NACL-NASULF-NA ASC-C) As per prep instructions. - Signed Rx of MOVIPREP 100 GM  SOLR (PEG-KCL-NACL-NASULF-NA ASC-C) As per prep instructions.;  #1 x 0;  Signed;  Entered by: Ezra Sites RN;  Authorized by: Meryl Dare MD [facility];  Method used: Electronically to [id number] [address], [place], [place]  [address], Ph: [phone] or [phone], Fax: [phone] Observations: Added new observation of ALLERGY REV: Done (04/04/2009 16:26)    Prescriptions: MOVIPREP 100 GM  SOLR (PEG-KCL-NACL-NASULF-NA ASC-C) As per prep instructions.  #1 x 0   Entered by:   Ezra Sites RN   Authorized by:   Meryl Dare MD [facility]   Signed by:   Ezra Sites RN on 04/04/2009   Method used:   Electronically to        [id number] (retail)       [address]       [place], [place]  [address]       Ph: [phone] or [phone]       Fax: [phone]   RxID:   [id number]

## 2010-02-06 NOTE — Letter (Signed)
Summary: [facility] Instructions  [facility]  [address] [place], [place] [address]   Phone: [phone]  Fax: [phone]       [name]    16-Apr-1945    MRN: [id number]        Procedure Day /Date:  Tuesday 04/18/2009     Arrival Time: 8:30 am      Procedure Time: 9:30 am     Location of Procedure:                    _x _  [facility] (4th Floor)                        PREPARATION FOR COLONOSCOPY WITH MOVIPREP   Starting 5 days prior to your procedure Thursday 4/7 do not eat nuts, seeds, popcorn, corn, beans, peas,  salads, or any raw vegetables.  Do not take any fiber supplements (e.g. Metamucil, Citrucel, and Benefiber).  THE DAY BEFORE YOUR PROCEDURE         DATE: Monday 4/11  1.  Drink clear liquids the entire day-NO SOLID FOOD  2.  Do not drink anything colored red or purple.  Avoid juices with pulp.  No orange juice.  3.  Drink at least 64 oz. (8 glasses) of fluid/clear liquids during the day to prevent dehydration and help the prep work efficiently.  CLEAR LIQUIDS INCLUDE: Water Jello Ice Popsicles Tea (sugar ok, no milk/cream) Powdered fruit flavored drinks Coffee (sugar ok, no milk/cream) Gatorade Juice: apple, white grape, white cranberry  Lemonade Clear bullion, consomm, broth Carbonated beverages (any kind) Strained chicken noodle soup Hard Candy                             4.  In the morning, mix first dose of MoviPrep solution:    Empty 1 Pouch A and 1 Pouch B into the disposable container    Add lukewarm drinking water to the top line of the container. Mix to dissolve    Refrigerate (mixed solution should be used within 24 hrs)  5.  Begin drinking the prep at 5:00 p.m. The MoviPrep container is divided by 4 marks.   Every 15 minutes drink the solution down to the next mark (approximately 8 oz) until the full liter is complete.   6.  Follow completed prep with 16 oz of clear liquid of your choice (Nothing  red or purple).  Continue to drink clear liquids until bedtime.  7.  Before going to bed, mix second dose of MoviPrep solution:    Empty 1 Pouch A and 1 Pouch B into the disposable container    Add lukewarm drinking water to the top line of the container. Mix to dissolve    Refrigerate  THE DAY OF YOUR PROCEDURE      DATE: Tuesday 4/12  Beginning at 4:30 a.m. (5 hours before procedure):         1. Every 15 minutes, drink the solution down to the next mark (approx 8 oz) until the full liter is complete.  2. Follow completed prep with 16 oz. of clear liquid of your choice.    3. You may drink clear liquids until 7:30 am (2 HOURS BEFORE PROCEDURE).   MEDICATION INSTRUCTIONS  Unless otherwise instructed, you should take regular prescription medications with a small sip of water   as early as possible the morning of  your procedure.  Diabetic patients - see separate instructions.             OTHER INSTRUCTIONS  You will need a responsible adult at least 65 years of age to accompany you and drive you home.   This person must remain in the waiting room during your procedure.  Wear loose fitting clothing that is easily removed.  Leave jewelry and other valuables at home.  However, you may wish to bring a book to read or  an iPod/MP3 player to listen to music as you wait for your procedure to start.  Remove all body piercing jewelry and leave at home.  Total time from sign-in until discharge is approximately 2-3 hours.  You should go home directly after your procedure and rest.  You can resume normal activities the  day after your procedure.  The day of your procedure you should not:   Drive   Make legal decisions   Operate machinery   Drink alcohol   Return to work  You will receive specific instructions about eating, activities and medications before you leave.    The above instructions have been reviewed and explained to me by   Ezra Sites RN  April 04, 2009 4:52 PM     I fully understand and can verbalize these instructions _____________________________ Date _________

## 2010-02-06 NOTE — Progress Notes (Signed)
Summary: lab results  Phone Note Outgoing Call Call back at [facility] Phone [phone] Call back at Work Phone [phone]   Summary of Call: LAB RESULTS: (LFTs only minimally increased:observe) advise patient: diabetes used to be better, instead of increase her meds ask her to focus on diet and increase physical activity offer a nutritionist referal other labs ok Signed by [facility] E. Paz MD on 02/18/2008 at 6:07 PM  Follow-up for Phone Call        left message with husband to have patient return call ...Marland KitchenMarland KitchenShary Decamp  February 19, 2008 11:11 AM  discussed with patient - patient declined nutritionist referral  ..Marland KitchenMarland KitchenShary Decamp  February 23, 2008 9:57 AM

## 2010-02-06 NOTE — Progress Notes (Signed)
Summary: [name]--[facility] Refill   Phone Note Refill Request   Refills Requested: Medication #1:  ATENOLOL 50 MG TABS Take 1 tablet once a day  Medication #2:  ALTACE 5 MG CAPS Take 1 capsule by mouth once a day Rx received via fax from [facility]-[id number]  Initial call taken by: Freddy Jaksch,  March 10, 2007 9:58 AM      Prescriptions: ATENOLOL 50 MG TABS (ATENOLOL) Take 1 tablet once a day  #90 x 1   Entered and Authorized by:   Shary Decamp   Signed by:   Shary Decamp on 03/10/2007   Method used:   Elect[facility] to ...       [id number]       [address]       [place], [place]  [address]       Ph: [phone] or [phone]       Fax: [phone]   RxID:   [id number] ALTACE 5 MG CAPS (RAMIPRIL) Take 1 capsule by mouth once a day  #90 x 1   Entered and Authorized by:   Shary Decamp   Signed by:   Shary Decamp on 03/10/2007   Method used:   Elect[facility] to ...       [id number]       [address]       [place], [place]  [address]       Ph: [phone] or [phone]       Fax: [phone]   RxID:   [id number]     Appended Document: [name]--[facility] Refill correction -- prescription was faxed to [facility]

## 2010-02-06 NOTE — Miscellaneous (Signed)
Summary: BONE DENSITY  Clinical Lists Changes  Orders: Added new Test order of T-Bone Densitometry (77080) - Signed Added new Test order of T-Lumbar Vertebral Assessment (77082) - Signed 

## 2010-02-06 NOTE — Assessment & Plan Note (Signed)
Summary: ACUTE FOR UPPER RESPIRA///PH   Vital Signs:  Patient profile:   65 year old female Weight:      206 pounds Temp:     98.0 degrees F oral BP sitting:   126 / 80  (left arm)  Vitals Entered By: Doristine Devoid (October 21, 2008 9:06 AM) CC: cough x1 wk somewhat productive    History of Present Illness: 65 yo woman here today for cough.  sxs started last week w/ sore throat and progressed to diffuse myalgias over the weekend.  now w/ cough- productive of thick yellow sputum.  subjective fevers earlier in the week.  no ear pain, facial pain or pressure.  mild nasal congestion but severe PND.  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  Current Medications (verified): 1)  Zocor 20 Mg  Tabs (Simvastatin) .Marland Kitchen.. 1 By Mouth Once Daily 2)  Altace 5 Mg Caps (Ramipril) .... Take 1 Capsule By Mouth Once A Day 3)  Atenolol 50 Mg Tabs (Atenolol) .... Take 1 Tablet Once A Day 4)  Hydrochlorothiazide 25 Mg  Tabs (Hydrochlorothiazide) .... As Needed For Edema (Takes Rarely) 5)  Glucophage 500 Mg  Tabs (Metformin Hcl) .... 2 By Mouth With Breakfast; 2 Tabs By Mouth With Dinner 6)  Cymbalta 60 Mg Cpep (Duloxetine Hcl) .... Take 1 Capsule By Mouth Once A Day 7)  Concerta 54 Mg Cr-Tabs (Methylphenidate Hcl) .Marland Kitchen.. 1 By Mouth Once Daily (Prescribed By Psych) 8)  Abilify 15 Mg Tabs (Aripiprazole) .... 1/2 By Mouth Once Daily (Prescribed By Psych) 9)  Requip 1 Mg  Tabs (Ropinirole Hcl) .Marland Kitchen.. 1 By Mouth Qhs 10)  Levsin 0.125 Mg  Tabs (Hyoscyamine Sulfate) .... Prn 11)  Prilosec 20 Mg  Cpdr (Omeprazole) .Marland Kitchen.. 1 By Mouth Qd 12)  Grape Seed Extract 100mg  .... Bid 13)  Onetouch Ultra Test  Strp (Glucose Blood) .... Use As Directed 14)  Hyoscyamine Sulfate 0.125 Mg Tbdp (Hyoscyamine Sulfate) .Marland Kitchen.. 1 By Mouth Q 4 Hours As Needed 15)  Vicodin 5-500 Mg Tabs (Hydrocodone-Acetaminophen) .Marland Kitchen.. 1 By Mouth Every 6 Hopurs As Needed Pain 16)  Azithromycin 250 Mg  Tabs (Azithromycin) .... 2 By  Mouth Today and Then 1  Daily For 4 Days  Allergies (verified): 1)  * Penicillins Group  Review of Systems      See HPI  Physical Exam  General:  alert and well-developed.   Head:  NCAT, no TTP over sinuses Eyes:  no injxn or inflammation Ears:  External ear exam shows no significant lesions or deformities.  Otoscopic examination reveals clear canals, tympanic membranes are intact bilaterally without bulging, retraction, inflammation or discharge. Hearing is grossly normal bilaterally. Nose:  mild congestion Mouth:  Oral mucosa and oropharynx without lesions or exudates.  Teeth in good repair. Lungs:  Normal respiratory effort, chest expands symmetrically. Lungs are clear to auscultation, no crackles or wheezes.  + hacking cough throughout exam Heart:  reg S1/S2 Cervical Nodes:  No lymphadenopathy noted   Impression & Recommendations:  Problem # 1:  BRONCHITIS- ACUTE (ICD-466.0) Assessment New pt's hx and PE consistent w/ bronchitis.  start Azithro given pt's PCN allergy.  reviewed supportive care and red flags that should prompt return.  Pt expresses understanding and is in agreement w/ this plan. Her updated medication list for this problem includes:    Azithromycin 250 Mg Tabs (Azithromycin) .Marland Kitchen... 2 by  mouth today and then 1 daily for 4 days  Complete Medication List: 1)  Zocor 20 Mg Tabs (  Simvastatin) .Marland Kitchen.. 1 by mouth once daily 2)  Altace 5 Mg Caps (Ramipril) .... Take 1 capsule by mouth once a day 3)  Atenolol 50 Mg Tabs (Atenolol) .... Take 1 tablet once a day 4)  Hydrochlorothiazide 25 Mg Tabs (Hydrochlorothiazide) .... As needed for edema (takes rarely) 5)  Glucophage 500 Mg Tabs (Metformin hcl) .... 2 by mouth with breakfast; 2 tabs by mouth with dinner 6)  Cymbalta 60 Mg Cpep (Duloxetine hcl) .... Take 1 capsule by mouth once a day 7)  Concerta 54 Mg Cr-tabs (Methylphenidate hcl) .Marland Kitchen.. 1 by mouth once daily (prescribed by psych) 8)  Abilify 15 Mg Tabs (Aripiprazole) .... 1/2 by mouth once  daily (prescribed by psych) 9)  Requip 1 Mg Tabs (Ropinirole hcl) .Marland Kitchen.. 1 by mouth qhs 10)  Levsin 0.125 Mg Tabs (Hyoscyamine sulfate) .... Prn 11)  Prilosec 20 Mg Cpdr (Omeprazole) .Marland Kitchen.. 1 by mouth qd 12)  Grape Seed Extract 100mg   .... Bid 13)  Onetouch Ultra Test Strp (Glucose blood) .... Use as directed 14)  Hyoscyamine Sulfate 0.125 Mg Tbdp (Hyoscyamine sulfate) .Marland Kitchen.. 1 by mouth q 4 hours as needed 15)  Vicodin 5-500 Mg Tabs (Hydrocodone-acetaminophen) .Marland Kitchen.. 1 by mouth every 6 hopurs as needed pain 16)  Azithromycin 250 Mg Tabs (Azithromycin) .... 2 by  mouth today and then 1 daily for 4 days  Patient Instructions: 1)  Follow up in 1 week if no better- or sooner if worsening 2)  Drink plenty of fluids 3)  Mucinex to thin your congestion 4)  Coricidin as needed for congestion 5)  Robitussin DM or Delsym for cough 6)  Take the Azithromycin as directed 7)  Hang in there!!! Prescriptions: AZITHROMYCIN 250 MG  TABS (AZITHROMYCIN) 2 by  mouth today and then 1 daily for 4 days  #6 x 0   Entered and Authorized by:   Neena Rhymes MD   Signed by:   Neena Rhymes MD on 10/21/2008   Method used:   Electronically to        [id number]       [address]       [place], [place]  [address]       Ph: [phone] or [phone]       Fax: [phone]   RxID:   [id number]

## 2010-02-06 NOTE — Letter (Signed)
Summary: Patient Notice- Colon Biospy Results  [facility]  [address] [place], [place] [address]   Phone: [phone]  Fax: [phone]        April 20, 2009 MRN: [id number]    [facility] [address] [place], [place]  [address]    Dear [name],  I am pleased to inform you that the biopsies taken during your recent colonoscopy did not show any evidence of cancer upon pathologic examination. The biopsies show inflammatory polyps.  Continue with the treatment plan as outlined on the day of your      exam.  You should have a repeat colonoscopy examination in 5 years.  Please call us if you are having persistent problems or have questions about your condition that have not been fully answered at this time.  Sincerely,  Meryl Dare MD [facility]  This letter has been electronically signed by your physician.  Appended Document: Patient Notice- Colon Biospy Results letter mailed 4.20.11.

## 2010-02-06 NOTE — Progress Notes (Signed)
Summary: PAZ-UTI AND BACK PAIN lmom  Phone Note Call from Patient   Caller: Mom Summary of Call: PT CALLING WANTING TO BE SEEN TODAY FOR A UTI AND BACK PAIN THAT  SHE HAVE HAD FOR SERVE DAYS. CALL HER AT [phone] Initial call taken by: Freddy Jaksch,  October 16, 2006 12:49 PM  Follow-up for Phone Call        left msg at pt work # number given is not a working number left msg at number [phone] to call Follow-up by: Kandice Hams,  October 16, 2006 2:40 PM  Additional Follow-up for Phone Call Additional follow up Details #1::        SPOKE WITH PT C/O LOW BACK PAIN NO BLOOD IN URINE OV SCHEDULED IN AM Additional Follow-up by: Kandice Hams,  October 16, 2006 3:09 PM

## 2010-02-06 NOTE — Letter (Signed)
Summary: Colonoscopy Letter  [facility]  [address] [place], [place] [address]   Phone: [phone]  Fax: [phone]      February 16, 2009 MRN: [id number]   [facility] [address] [place], [place]  [address]   Dear Ms. Grennan,   According to your medical record, it is time for you to schedule a Colonoscopy. The American Cancer Society recommends this procedure as a method to detect early colon cancer. Patients with a family history of colon cancer, or a personal history of colon polyps or inflammatory bowel disease are at increased risk.  This letter has beeen generated based on the recommendations made at the time of your procedure. If you feel that in your particular situation this may no longer apply, please contact our office.  Please call our office at [phone] to schedule this appointment or to update your records at your earliest convenience.  Thank you for cooperating with [place] to provide you with the very best care possible.   Sincerely,  Judie Petit T. Russella Dar, M.D.  [facility] [phone]

## 2010-02-06 NOTE — Assessment & Plan Note (Signed)
Summary: CPX/CDJ   Vital Signs:  Patient Profile:   65 Years Old Female Height:     66.75 inches Weight:      206.8 pounds BMI:     32.75 Pulse rate:   66 / minute BP sitting:   140 / 80  Vitals Entered By: Shary Decamp (June 09, 2007 8:44 AM)             Comments -patient had mmg last week -- report pending -last bone density -- "long time ago" -patient has never had pneumovax -- not sure if she wants one -- would like to discuss with dr Drue Novel ..................Marland KitchenShary Decamp  June 09, 2007 8:49 AM      Chief Complaint:  CPX - fasting.  History of Present Illness: CPX -patient had mmg last week -- report pending -last bone density -- "long time ago" -patient has never had pneumovax -- not sure if she wants one; explained benefits -occ unsteady on her gait, tremorat handswhen attempts to use her hands    Updated Prior Medication List: ALTACE 5 MG CAPS (RAMIPRIL) Take 1 capsule by mouth once a day ATENOLOL 50 MG TABS (ATENOLOL) Take 1 tablet once a day CYMBALTA 60 MG CPEP (DULOXETINE HCL) Take 1 capsule by mouth once a day HYDROCHLOROTHIAZIDE 25 MG  TABS (HYDROCHLOROTHIAZIDE) 1 by mouth once daily GLUCOPHAGE 500 MG  TABS (METFORMIN HCL) 2 by mouth with breakfast; 2 tabs by mouth with dinner REQUIP 1 MG  TABS (ROPINIROLE HCL) 1 by mouth qhs LEVSIN 0.125 MG  TABS (HYOSCYAMINE SULFATE) prn CONCERTA 18 MG  TBCR (METHYLPHENIDATE HCL) 1 by mouth qd PRILOSEC 20 MG  CPDR (OMEPRAZOLE) 1 by mouth qd ABILIFY 5 MG  TABS (ARIPIPRAZOLE) 1 by mouth qd * GRAPE SEED EXTRACT 100MG  bid  Current Allergies (reviewed today): * PENICILLINS GROUP  Past Medical History:    DIABETES MELLITUS, TYPE II (ICD-250.00)    DEPRESSION     HYPERLIPIDEMIA (ICD-272.4)    HYPERTENSION (ICD-401.9)    IBS and dyspepsia    SYNDROME, RESTLESS LEGS    COLONIC POLYPS   Past Surgical History:    Reviewed history and no changes required:       fibroid removal (uterus) in the 90s        Tonsillectomy   Family History:    lung cancer--mother She was a smoker.    + ovarian cancer ?paternal aunt    breast cancer--no    Diabetes runs in the family    Colon CA --father    MI- GM in her 22s  Social History:    Reviewed history from 04/06/2007 and no changes required:       Married       three children       patient is a retired Engineer, civil (consulting)       husband ill, doing better, at home now   Risk Factors: Tobacco use:  quit    Year quit:  1977 Alcohol use:  yes  Mammogram History:    Date of Last Mammogram:  01/29/2006  PAP Smear History:     Date of Last PAP Smear:  01/07/2006    Results:  normal   Colonoscopy History:     Date of Last Colonoscopy:  03/14/2004    Results:  diverticulosis; polyps    Review of Systems  CV      Denies chest pain or discomfort.  Resp      Denies shortness of breath.  GI  Denies bloody stools.  GU      Denies hematuria.  Neuro      Denies brief paralysis, headaches, inability to speak, and visual disturbances.  Psych      Denies anxiety and depression.      sx well control abilify helped a lot  Endo      CBGs were better when she was doing  W W in AM 150s-120s   Physical Exam  General:     alert and well-developed.   Neck:     no thyromegaly and normal carotid upstroke.   Lungs:     normal respiratory effort, no intercostal retractions, no accessory muscle use, and normal breath sounds.   Heart:     normal rate, regular rhythm, and no murmur.   Abdomen:     soft and non-tender.  no hepatomegaly and no splenomegaly.   Extremities:     no pretibial edema bilaterally  Neurologic:     alert & oriented X3, strength normal in all extremities, and gait normal.  no tremor noted today at UE Psych:     Oriented X3, memory intact for recent and remote, normally interactive, good eye contact, not anxious appearing, and not depressed appearing.      Impression & Recommendations:  Problem # 1:  HEALTH SCREENING  (ICD-V70.0) female care per gyn Last Mammogram:  5-09 (results pending)  Last PAP Smear:  01/07/2006 normal  Last Colonoscopy:  03/14/2004,    Results:  diverticulosis; polyps ---was rec to redo 2011 DEXA--last was long ago. Order one Td aprox 2008 (at [facility]) Pneumonia shot today EKG--increased QT?  diet and exercise discussed Orders: TLB-Lipid Panel (80061-LIPID) TLB-Hepatic/Liver Function Pnl (80076-HEPATIC) TLB-TSH (Thyroid Stimulating Hormone) (84443-TSH) Venipuncture (62130) Radiology Referral (Radiology) EKG w/ Interpretation (93000)   Problem # 2:  DEPRESSION (ICD-311) sees Dr Jennelle Human Her updated medication list for this problem includes: Abilify and    Cymbalta 60 Mg Cpep (Duloxetine hcl) .Marland Kitchen... Take 1 capsule by mouth once a day  Her updated medication list for this problem includes:    Cymbalta 60 Mg Cpep (Duloxetine hcl) .Marland Kitchen... Take 1 capsule by mouth once a day   Problem # 3:  DIABETES MELLITUS, TYPE II (ICD-250.00)  Her updated medication list for this problem includes:    Altace 5 Mg Caps (Ramipril) .Marland Kitchen... Take 1 capsule by mouth once a day    Glucophage 500 Mg Tabs (Metformin hcl) .Marland Kitchen... 2 by mouth with breakfast; 2 tabs by mouth with dinner  Labs Reviewed: HgBA1c: 7.2 (04/06/2007)   Creat: 0.7 (04/06/2007)     Orders: TLB-A1C / Hgb A1C (Glycohemoglobin) (83036-A1C)   Problem # 4:  tremor observe related to meds?  Complete Medication List: 1)  Altace 5 Mg Caps (Ramipril) .... Take 1 capsule by mouth once a day 2)  Atenolol 50 Mg Tabs (Atenolol) .... Take 1 tablet once a day 3)  Cymbalta 60 Mg Cpep (Duloxetine hcl) .... Take 1 capsule by mouth once a day 4)  Hydrochlorothiazide 25 Mg Tabs (Hydrochlorothiazide) .Marland Kitchen.. 1 by mouth once daily 5)  Glucophage 500 Mg Tabs (Metformin hcl) .... 2 by mouth with breakfast; 2 tabs by mouth with dinner 6)  Requip 1 Mg Tabs (Ropinirole hcl) .Marland Kitchen.. 1 by mouth qhs 7)  Levsin 0.125 Mg Tabs (Hyoscyamine sulfate) .... Prn 8)   Concerta 18 Mg Tbcr (Methylphenidate hcl) .Marland Kitchen.. 1 by mouth qd 9)  Prilosec 20 Mg Cpdr (Omeprazole) .Marland Kitchen.. 1 by mouth qd 10)  Abilify 5 Mg Tabs (  Aripiprazole) .Marland Kitchen.. 1 by mouth qd 11)  Grape Seed Extract 100mg   .... Bid  Other Orders: Pneumococcal Vaccine (24401) Admin 1st Vaccine (02725)   Patient Instructions: 1)  Please schedule a follow-up appointment in 4 months.   ]  Preventive Care Screening  Colonoscopy:    Date:  03/14/2004    Next Due:  03/2009    Results:  diverticulosis; polyps   Pap Smear:    Date:  01/07/2006    Results:  normal   EGD:    Date:  03/14/2004    Results:  Normal     Data Entry:  Pap Smear: Date:  01/07/2006  normal   Colonoscopy: Date:  03/14/2004  diverticulosis; polyps    Pneumovax Vaccine    Vaccine Type: Pneumovax    Site: right deltoid    Mfr: Merck    Dose: 0.5 ml    Route: IM    Given by: Shary Decamp    Exp. Date: 06/12/2008    Lot #: [id number]      Appended Document: CPX/CDJ spoke w/ cards few days ago: EKG ok but they suggested to repeat it at some point

## 2010-02-06 NOTE — Assessment & Plan Note (Signed)
Summary: ROA 4 MONTH RECHECK//PH   Vital Signs:  Patient Profile:   65 Years Old Female Height:     66.75 inches Weight:      216.6 pounds Pulse rate:   80 / minute Pulse rhythm:   regular BP sitting:   120 / 82  (left arm) Cuff size:   large  Vitals Entered By: Shary Decamp (February 16, 2008 10:39 AM)                 Chief Complaint:  rov - not fasting.  History of Present Illness: ROV DIABETES-- ambulatory CBGs 160s in AM, occ 180s, c/o occ. tingling @ feet, no pain  HYPERLIPIDEMIA-- on meds , no s/e ; diet poor during Xmas, doing better now  HYPERTENSION -- occ ambulatory BPs (130s-140s/70-80) last OV c/o tremor,no better or worse     Updated Prior Medication List: ALTACE 5 MG CAPS (RAMIPRIL) Take 1 capsule by mouth once a day ATENOLOL 50 MG TABS (ATENOLOL) Take 1 tablet once a day CYMBALTA 60 MG CPEP (DULOXETINE HCL) Take 1 capsule by mouth once a day HYDROCHLOROTHIAZIDE 25 MG  TABS (HYDROCHLOROTHIAZIDE) 1 by mouth once daily GLUCOPHAGE 500 MG  TABS (METFORMIN HCL) 2 by mouth with breakfast; 2 tabs by mouth with dinner REQUIP 1 MG  TABS (ROPINIROLE HCL) 1 by mouth qhs LEVSIN 0.125 MG  TABS (HYOSCYAMINE SULFATE) prn CONCERTA 54 MG CR-TABS (METHYLPHENIDATE HCL) 1 by mouth once daily (prescribed by psych) PRILOSEC 20 MG  CPDR (OMEPRAZOLE) 1 by mouth qd ABILIFY 15 MG TABS (ARIPIPRAZOLE) 1/2 by mouth once daily (prescribed by psych) * GRAPE SEED EXTRACT 100MG  bid ZOCOR 20 MG  TABS (SIMVASTATIN) 1 by mouth once daily ONETOUCH ULTRA TEST  STRP (GLUCOSE BLOOD) use as directed HYOSCYAMINE SULFATE 0.125 MG TBDP (HYOSCYAMINE SULFATE) 1 by mouth q 4 hours as needed VICODIN 5-500 MG TABS (HYDROCODONE-ACETAMINOPHEN) 1 by mouth every 6 hopurs as needed pain  Current Allergies (reviewed today): * PENICILLINS GROUP  Past Medical History:    Reviewed history from 10/09/2007 and no changes required:       DIABETES MELLITUS, TYPE II (ICD-250.00)       DEPRESSION   HYPERLIPIDEMIA (ICD-272.4)       HYPERTENSION (ICD-401.9)       IBS and dyspepsia       SYNDROME, RESTLESS LEGS       COLONIC POLYPS        Osteopenia DEXA 06-2007, Rx Ca Vit D  Past Surgical History:    Reviewed history from 06/09/2007 and no changes required:       fibroid removal (uterus) in the 90s       Tonsillectomy   Social History:    Reviewed history from 06/09/2007 and no changes required:       Married       three children       patient is a retired Engineer, civil (consulting)       husband ill, doing better, at home now    Review of Systems  Eyes      vision ok, due to see her eye doctor, she will call  CV      Denies chest pain or discomfort and swelling of feet.  Resp      Denies cough, shortness of breath, and wheezing.  GI      Denies abdominal pain and bloody stools.   Physical Exam  General:     alert and well-developed.   Lungs:  normal respiratory effort, no intercostal retractions, no accessory muscle use, and normal breath sounds.   Heart:     normal rate, regular rhythm, and no murmur.   Pulses:     normal pedal pulses bilaterally  Extremities:     no pretibial edema bilaterally   Diabetes Management Exam:    Foot Exam (with socks and/or shoes not present):       Sensory-Pinprick/Light touch:          Left medial foot (L-4): normal          Left dorsal foot (L-5): normal          Left lateral foot (S-1): normal          Right medial foot (L-4): normal          Right dorsal foot (L-5): normal          Right lateral foot (S-1): normal       Sensory-Monofilament:          Left foot: normal          Right foot: normal       Inspection:          Left foot: abnormal             Comments: callous great toe           Right foot: abnormal             Comments: callous great toe        Nails:          Left foot: thickened          Right foot: thickened    Impression & Recommendations:  Problem # 1:  DIABETES MELLITUS, TYPE II (ICD-250.00) labs    c/o some tingling, early  neuropathy? Marland KitchenMarland KitchenMarland Kitchenfeet care discussed  Her updated medication list for this problem includes:    Altace 5 Mg Caps (Ramipril) .Marland Kitchen... Take 1 capsule by mouth once a day    Glucophage 500 Mg Tabs (Metformin hcl) .Marland Kitchen... 2 by mouth with breakfast; 2 tabs by mouth with dinner  Orders: Venipuncture (47829) TLB-A1C / Hgb A1C (Glycohemoglobin) (83036-A1C) TLB-Microalbumin/Creat Ratio, Urine (82043-MALB)  Labs Reviewed: HgBA1c: 7.0 (10/09/2007)   Creat: 0.8 (10/09/2007)   Microalbumin: 1.5 (04/06/2007)   Problem # 2:  HYPERLIPIDEMIA (ICD-272.4) labs  Her updated medication list for this problem includes:    Zocor 20 Mg Tabs (Simvastatin) .Marland Kitchen... 1 by mouth once daily  Orders: TLB-Hepatic/Liver Function Pnl (80076-HEPATIC)  Labs Reviewed: Chol: 135 (10/09/2007)   HDL: 42.0 (10/09/2007)   LDL: 69 (10/09/2007)   TG: 118 (10/09/2007) SGOT: 36 (10/09/2007)   SGPT: 42 (10/09/2007)   Problem # 3:  HYPERTENSION (ICD-401.9) at goal  Her updated medication list for this problem includes:    Altace 5 Mg Caps (Ramipril) .Marland Kitchen... Take 1 capsule by mouth once a day    Atenolol 50 Mg Tabs (Atenolol) .Marland Kitchen... Take 1 tablet once a day    Hydrochlorothiazide 25 Mg Tabs (Hydrochlorothiazide) .Marland Kitchen... 1 by mouth once daily  Orders: TLB-BMP (Basic Metabolic Panel-BMET) (80048-METABOL) TLB-CBC Platelet - w/Differential (85025-CBCD)  BP today: 120/82 Prior BP: 124/80 (11/24/2007)  Labs Reviewed: Creat: 0.8 (10/09/2007) Chol: 135 (10/09/2007)   HDL: 42.0 (10/09/2007)   LDL: 69 (10/09/2007)   TG: 118 (10/09/2007)   Problem # 4:  declined H1N1 , explained benefits did have the flu shot interested on the shingles shot, will call when ready  Complete Medication List: 1)  Altace 5 Mg  Caps (Ramipril) .... Take 1 capsule by mouth once a day 2)  Atenolol 50 Mg Tabs (Atenolol) .... Take 1 tablet once a day 3)  Cymbalta 60 Mg Cpep (Duloxetine hcl) .... Take 1 capsule by mouth once a day 4)   Hydrochlorothiazide 25 Mg Tabs (Hydrochlorothiazide) .Marland Kitchen.. 1 by mouth once daily 5)  Glucophage 500 Mg Tabs (Metformin hcl) .... 2 by mouth with breakfast; 2 tabs by mouth with dinner 6)  Requip 1 Mg Tabs (Ropinirole hcl) .Marland Kitchen.. 1 by mouth qhs 7)  Levsin 0.125 Mg Tabs (Hyoscyamine sulfate) .... Prn 8)  Concerta 54 Mg Cr-tabs (Methylphenidate hcl) .Marland Kitchen.. 1 by mouth once daily (prescribed by psych) 9)  Prilosec 20 Mg Cpdr (Omeprazole) .Marland Kitchen.. 1 by mouth qd 10)  Abilify 15 Mg Tabs (Aripiprazole) .... 1/2 by mouth once daily (prescribed by psych) 11)  Grape Seed Extract 100mg   .... Bid 12)  Zocor 20 Mg Tabs (Simvastatin) .Marland Kitchen.. 1 by mouth once daily 13)  Onetouch Ultra Test Strp (Glucose blood) .... Use as directed 14)  Hyoscyamine Sulfate 0.125 Mg Tbdp (Hyoscyamine sulfate) .Marland Kitchen.. 1 by mouth q 4 hours as needed 15)  Vicodin 5-500 Mg Tabs (Hydrocodone-acetaminophen) .Marland Kitchen.. 1 by mouth every 6 hopurs as needed pain   Patient Instructions: 1)  Please schedule a follow-up appointment in 4 months.

## 2010-02-06 NOTE — Letter (Signed)
Summary: Primary Care Consult Scheduled Letter  [facility]  [address] [place], [place] [address]   Phone: [phone]  Fax: [phone]      06/11/2007 MRN: [id number]  [name] [address] [place], [place]  [address]    Dear [name],      We have scheduled an appointment for you.  At the recommendation of Dr.Paz, we have scheduled you a bone density on June 18th at 11:30am.  Their address is [facility] [address] [place] in the basement. The office phone number is [phone].  If this appointment day and time is not convenient for you, please feel free to call out office  and reschedule the appointment.     It is important for you to keep your scheduled appointments. We are here to make sure you are given good patient care. If you have questions or you have made changes to your appointment, please notify us at  [phone], ask for Tiffany.    Thank you,  Patient Care Coordinator [facility] [facility]

## 2010-02-06 NOTE — Progress Notes (Signed)
Summary: RX--PAZ  Phone Note Refill Request   Refills Requested: Medication #1:  RAMIPRIL 5 MG  Medication #2:  ATENOLOL 50 MG TABS Take 1 tablet once a day  Medication #3:  FENIFIBRATE 160 MG  Medication #4:  CRESTOR 5 MG [facility]--[phone]  Initial call taken by: Freddy Jaksch,  September 05, 2008 10:43 AM  Follow-up for Phone Call        LEFT MSG RX FAXED TO [facility] FOR FOR ALTACE AND ATENOLOL, DO NOT SEE WHERE PT IS ON CRESTOR OR FENOFIBRATE CALL TO VERIFY Follow-up by: Kandice Hams,  September 06, 2008 4:48 PM    Prescriptions: ALTACE 5 MG CAPS (RAMIPRIL) Take 1 capsule by mouth once a day  #90 x 0   Entered by:   Kandice Hams   Authorized by:   [name] MD   Signed by:   Kandice Hams on 09/06/2008   Method used:   Faxed to ...       [facility]* (mail-order)             ,          Ph: [phone]       Fax: [phone]   RxID:   [id number] ATENOLOL 50 MG TABS (ATENOLOL) Take 1 tablet once a day  #90 x 0   Entered by:   Kandice Hams   Authorized by:   [name] MD   Signed by:   Kandice Hams on 09/06/2008   Method used:   Faxed to ...       [facility]* (mail-order)             ,          Ph: [phone]       Fax: [phone]   RxID:   [id number]

## 2010-02-06 NOTE — Letter (Signed)
Summary: Geralynn Rile  [facility]   Imported By: Lanelle Bal 09/05/2008 12:01:37  _____________________________________________________________________  External Attachment:    Type:   Image     Comment:   External Document

## 2010-02-06 NOTE — Assessment & Plan Note (Signed)
Summary: uti?/kdc   Vital Signs:  Patient profile:   65 year old female Weight:      211.4 pounds Temp:     98.6 degrees F oral Pulse rate:   76 / minute Resp:     15 per minute BP sitting:   122 / 70  (left arm) Cuff size:   large  Vitals Entered By: Shonna Chock (December 07, 2008 3:07 PM) CC: ? UTI: frequent urination, abdominal pain and discomfort when urinating x 2 days Comments REVIEWED MED LIST, PATIENT AGREED DOSE AND INSTRUCTION CORRECT    CC:  ? UTI: frequent urination and abdominal pain and discomfort when urinating x 2 days.  History of Present Illness: Pain @ terminal urination  & cloudy urine 12/04/2008 ; pelvic "floor aching with spasms", better with Pyridium.No PMH of recurrent UTI. 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 GI:  Denies bloody stools, change in bowel habits, constipation, dark tarry stools, and diarrhea; No flank pain. GU:  Complains of nocturia, urinary frequency, and urinary hesitancy; denies discharge, hematuria, and incontinence. MS:  Denies low back pain. Derm:  Denies lesion(s) and rash.  Physical Exam  General:  well-nourished,in no acute distress; alert,appropriate and cooperative throughout examination Abdomen:  Bowel sounds positive,abdomen soft and non-tender without masses, organomegaly or hernias noted. Extremities:  Neg SLR bilaterally Skin:  Intact without suspicious lesions or rashes Cervical Nodes:  No lymphadenopathy noted Axillary Nodes:  No palpable lymphadenopathy   Impression & Recommendations:  Problem # 1:  UTI (ICD-599.0)  Orders: T-Culture, Urine ([address]) UA Dipstick w/o Micro (manual) (09811)  Her updated medication list for this problem includes:    Ciprofloxacin Hcl 500 Mg Tabs (Ciprofloxacin hcl) .Marland Kitchen... 1 two times a day with 8 oz of water  Complete Medication List: 1)  Zocor 20 Mg Tabs  (Simvastatin) .Marland Kitchen.. 1 by mouth once daily 2)  Altace 5 Mg Caps (Ramipril) .... Take 1 capsule by mouth once a day 3)  Atenolol 50 Mg Tabs (Atenolol) .... Take 1 tablet once a day 4)  Hydrochlorothiazide 25 Mg Tabs (Hydrochlorothiazide) .... As needed for edema (takes rarely) 5)  Glucophage 500 Mg Tabs (Metformin hcl) .... 2 by mouth with breakfast; 2 tabs by mouth with dinner 6)  Cymbalta 60 Mg Cpep (Duloxetine hcl) .... Take 1 capsule by mouth once a day 7)  Concerta 54 Mg Cr-tabs (Methylphenidate hcl) .Marland Kitchen.. 1 by mouth once daily (prescribed by psych) 8)  Abilify 15 Mg Tabs (Aripiprazole) .... 1/2 by mouth once daily (prescribed by psych) 9)  Requip 1 Mg Tabs (Ropinirole hcl) .Marland Kitchen.. 1 by mouth qhs 10)  Levsin 0.125 Mg Tabs (Hyoscyamine sulfate) .... Prn 11)  Prilosec 20 Mg Cpdr (Omeprazole) .Marland Kitchen.. 1 by mouth qd 12)  Grape Seed Extract 100mg   .... Bid 13)  Onetouch Ultra Test Strp (Glucose blood) .... Use as directed 14)  Hyoscyamine Sulfate 0.125 Mg Tbdp (Hyoscyamine sulfate) .Marland Kitchen.. 1 by mouth q 4 hours as needed 15)  Vicodin 5-500 Mg Tabs (Hydrocodone-acetaminophen) .Marland Kitchen.. 1 by mouth every 6 hopurs as needed pain 16)  Ciprofloxacin Hcl 500 Mg Tabs (Ciprofloxacin hcl) .Marland Kitchen.. 1 two times a day with 8 oz of water  Patient Instructions: 1)  Drink as much fluid as you can tolerate for the next few days. Cell # [id number] Prescriptions: CIPROFLOXACIN HCL 500 MG TABS (CIPROFLOXACIN HCL) 1 two times a  day with 8 oz of water  #20 x 0   Entered and Authorized by:   Marga Melnick MD   Signed by:   Marga Melnick MD on 12/07/2008   Method used:   Faxed to ...       [id number]* (retail)       [address]       [place], [place]  [address]       Ph: [phone] or [phone]       Fax: [phone]   RxID:   [id number]   Laboratory Results   Urine Tests    Routine Urinalysis   Color: straw Appearance: Cloudy Glucose: negative   (Normal Range: Negative) Bilirubin: negative   (Normal Range:  Negative) Ketone: negative   (Normal Range: Negative) Blood: large   (Normal Range: Negative) pH: 7.0   (Normal Range: 5.0-8.0) Protein: negative   (Normal Range: Negative) Urobilinogen: 0.2   (Normal Range: 0-1) Nitrite: positive   (Normal Range: Negative) Leukocyte Esterace: moderate   (Normal Range: Negative)

## 2010-02-06 NOTE — Assessment & Plan Note (Signed)
Summary: fu/ns/kdc   Vital Signs:  Patient profile:   65 year old female Height:      66.75 inches Weight:      210.4 pounds BMI:     33.32 Pulse rate:   80 / minute BP sitting:   132 / 70  Vitals Entered By: Dena Billet CC: ROV Comments Blood sugar readings at home-99-180 Blood pressure readings at home 140/80 -RP   History of Present Illness: DIABETES ambulatory CBGs 150-180 fasting in AM, 118 in the afternoon " room for improvment in my diet" good medication compliance   HYPERLIPIDEMIA  good medication compliance  exercise sporadically  HYPERTENSION  ambulatory BPs 140s/70-80     Current Medications (verified): 1)  Zocor 20 Mg  Tabs (Simvastatin) .Marland Kitchen.. 1 By Mouth Once Daily 2)  Altace 5 Mg Caps (Ramipril) .... Take 1 Capsule By Mouth Once A Day 3)  Atenolol 50 Mg Tabs (Atenolol) .... Take 1 Tablet Once A Day 4)  Hydrochlorothiazide 25 Mg  Tabs (Hydrochlorothiazide) .... As Needed For Edema (Takes Rarely) 5)  Glucophage 500 Mg  Tabs (Metformin Hcl) .... 2 By Mouth With Breakfast; 2 Tabs By Mouth With Dinner 6)  Cymbalta 60 Mg Cpep (Duloxetine Hcl) .... Take 1 Capsule By Mouth Once A Day 7)  Concerta 54 Mg Cr-Tabs (Methylphenidate Hcl) .Marland Kitchen.. 1 By Mouth Once Daily (Prescribed By Psych) 8)  Abilify 15 Mg Tabs (Aripiprazole) .... 1/2 By Mouth Once Daily (Prescribed By Psych) 9)  Requip 1 Mg  Tabs (Ropinirole Hcl) .Marland Kitchen.. 1 By Mouth Qhs 10)  Prilosec 20 Mg  Cpdr (Omeprazole) .Marland Kitchen.. 1 By Mouth Qd 11)  Onetouch Ultra Test  Strp (Glucose Blood) .... Use As Directed 12)  Hyoscyamine Sulfate 0.125 Mg Tbdp (Hyoscyamine Sulfate) .Marland Kitchen.. 1 By Mouth Q 4 Hours As Needed 13)  Vicodin 5-500 Mg Tabs (Hydrocodone-Acetaminophen) .Marland Kitchen.. 1 By Mouth Every 6 Hopurs As Needed Pain  Allergies (verified): 1)  * Penicillins Group  Past History:  Past Medical History: DIABETES MELLITUS, TYPE II (ICD-250.00) DEPRESSION -- sees Dr Benjie Karvonen  HYPERLIPIDEMIA  HYPERTENSION  IBS and dyspepsia SYNDROME,  RESTLESS LEGS COLONIC POLYPS  Osteopenia DEXA 06-2007, Rx Ca Vit D  Past Surgical History: Reviewed history from 06/09/2007 and no changes required. fibroid removal (uterus) in the 90s Tonsillectomy  Social History: Reviewed history from 06/09/2007 and no changes required. 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 GI:  Denies bloody stools, diarrhea, nausea, and vomiting. GU:  Denies dysuria and hematuria. Psych:  symptoms well controlled .  Physical Exam  General:  alert, well-developed, and overweight-appearing.   Lungs:  normal respiratory effort, no intercostal retractions, and no accessory muscle use.   Heart:  normal rate, regular rhythm, no murmur, and no gallop.   Pulses:  normal pedal pulses bilaterally  Extremities:  no pretibial edema bilaterally   Diabetes Management Exam:    Foot Exam (with socks and/or shoes not present):       Sensory-Pinprick/Light touch:          Left medial foot (L-4): normal          Left dorsal foot (L-5): normal          Left lateral foot (S-1): normal          Right medial foot (L-4): normal          Right dorsal foot (  L-5): normal          Right lateral foot (S-1): normal       Sensory-Monofilament:          Left foot: normal          Right foot: normal       Inspection:          Left foot: normal          Right foot: normal       Nails:          Left foot: normal          Right foot: normal   Impression & Recommendations:  Problem # 1:  UTI (ICD-599.0) resolved, recheck urine, see instructions The following medications were removed from the medication list:    Ciprofloxacin Hcl 500 Mg Tabs (Ciprofloxacin hcl) .Marland Kitchen... 1 two times a day with 8 oz of water  Problem # 2:  DIABETES MELLITUS, TYPE II (ICD-250.00) encourage to improve diet and exercise has seen a nutritionist in the past, patient doesn't  feel it is worth  to go back to them at this point last eye exam 08/2008: Negative labs Her updated medication list for this problem includes:    Altace 5 Mg Caps (Ramipril) .Marland Kitchen... Take 1 capsule by mouth once a day    Glucophage 500 Mg Tabs (Metformin hcl) .Marland Kitchen... 2 by mouth with breakfast; 2 tabs by mouth with dinner  Problem # 3:  HYPERLIPIDEMIA (ICD-272.4) due for labs Her updated medication list for this problem includes:    Zocor 20 Mg Tabs (Simvastatin) .Marland Kitchen... 1 by mouth once daily  Labs Reviewed: SGOT: 30 (06/17/2008)   SGPT: 36 (06/17/2008)   HDL:42.0 (10/09/2007), 43.4 (06/09/2007)  LDL:69 (10/09/2007), DEL (16/60/6301)  Chol:135 (10/09/2007), 215 (06/09/2007)  Trig:118 (10/09/2007), 186 (06/09/2007)  Problem # 4:  HYPERTENSION (ICD-401.9) no change, labs Her updated medication list for this problem includes:    Altace 5 Mg Caps (Ramipril) .Marland Kitchen... Take 1 capsule by mouth once a day    Atenolol 50 Mg Tabs (Atenolol) .Marland Kitchen... Take 1 tablet once a day    Hydrochlorothiazide 25 Mg Tabs (Hydrochlorothiazide) .Marland Kitchen... As needed for edema (takes rarely)  BP today: 132/70 Prior BP: 122/70 (12/07/2008)  Labs Reviewed: K+: 4.7 (02/16/2008) Creat: : 0.8 (02/16/2008)   Chol: 135 (10/09/2007)   HDL: 42.0 (10/09/2007)   LDL: 69 (10/09/2007)   TG: 118 (10/09/2007)  Problem # 5:  HEALTH SCREENING (ICD-V70.0) due for a physical, see instructions she did have a shingles shot in the past  Complete Medication List: 1)  Zocor 20 Mg Tabs (Simvastatin) .Marland Kitchen.. 1 by mouth once daily 2)  Altace 5 Mg Caps (Ramipril) .... Take 1 capsule by mouth once a day 3)  Atenolol 50 Mg Tabs (Atenolol) .... Take 1 tablet once a day 4)  Hydrochlorothiazide 25 Mg Tabs (Hydrochlorothiazide) .... As needed for edema (takes rarely) 5)  Glucophage 500 Mg Tabs (Metformin hcl) .... 2 by mouth with breakfast; 2 tabs by mouth with dinner 6)  Cymbalta 60 Mg Cpep (Duloxetine hcl) .... Take 1 capsule by mouth once a day 7)  Concerta  54 Mg Cr-tabs (Methylphenidate hcl) .Marland Kitchen.. 1 by mouth once daily (prescribed by psych) 8)  Abilify 15 Mg Tabs (Aripiprazole) .... 1/2 by mouth once daily (prescribed by psych) 9)  Requip 1 Mg Tabs (Ropinirole hcl) .Marland Kitchen.. 1 by mouth qhs 10)  Prilosec 20 Mg Cpdr (Omeprazole) .Marland Kitchen.. 1 by mouth qd 11)  Onetouch Ultra Test Strp (  Glucose blood) .... Use as directed 12)  Hyoscyamine Sulfate 0.125 Mg Tbdp (Hyoscyamine sulfate) .Marland Kitchen.. 1 by mouth q 4 hours as needed 13)  Vicodin 5-500 Mg Tabs (Hydrocodone-acetaminophen) .Marland Kitchen.. 1 by mouth every 6 hopurs as needed pain  Patient Instructions: 1)  come back fasting: 2)  U dip , if + get a culture---  Dx UTI 3)  BMP----  dx hypertension 4)  FLP, AST, ALT----Dx  high cholesterol 5)  hemoglobin A1c, microalbumin ---- Dx diabetes 6)  Please schedule a follow-up appointment in 3-4 months  ( physical exam)   Influenza Immunization History:    Influenza # 1:  had flu shot at work (01/24/2009)

## 2010-02-06 NOTE — Assessment & Plan Note (Signed)
Summary: 4 MONTH FOLLOW-UP/SCM   Vital Signs:  Patient Profile:   65 Years Old Female Height:     66.75 inches Weight:      215.8 pounds Pulse rate:   68 / minute BP sitting:   128 / 80  (left arm)  Vitals Entered By: Doristine Devoid (October 09, 2007 8:13 AM)                 Chief Complaint:  4 month roa and check left ear has been hearing crackling sounds.  History of Present Illness: 4 month roa  c/o check left ear has been hearing crackling-pop sounds; no pain or tinnitus hand tremors x long time, slt worse x 2 months-- caffeine intake  x 1 or 2 a day , occ HA x 2 weeks, no nausea        DIABETES --CBGs AM 150s fasting       Osteopenia--on Ca and Vit D, exercise some       HYPERLIPIDEMIA-- on zocor , no s/e       HYPERTENSION--BPs 120s/70s, occ 140s     Current Allergies: * PENICILLINS GROUP  Past Medical History:    Reviewed history from 06/09/2007 and no changes required:       DIABETES MELLITUS, TYPE II (ICD-250.00)       DEPRESSION        HYPERLIPIDEMIA (ICD-272.4)       HYPERTENSION (ICD-401.9)       IBS and dyspepsia       SYNDROME, RESTLESS LEGS       COLONIC POLYPS        Osteopenia DEXA 06-2007, Rx Ca Vit D  Past Surgical History:    Reviewed history from 06/09/2007 and no changes required:       fibroid removal (uterus) in the 90s       Tonsillectomy     Review of Systems       diet is "reasonable"    CV      Denies chest pain or discomfort and swelling of feet.  Resp      Denies cough and shortness of breath.   Physical Exam  General:     alert and well-developed.   Ears:     R ear normal and L ear normal.   hearing normal to conversational speach Lungs:     normal respiratory effort, no intercostal retractions, no accessory muscle use, and normal breath sounds.   Heart:     normal rate, regular rhythm, and no murmur.   Extremities:     no pretibial edema bilaterally  Psych:     not anxious appearing and not depressed  appearing.      Impression & Recommendations:  Problem # 1:  OSTEOPENIA (ICD-733.90) encourage exercise!  Problem # 2:  DIABETES MELLITUS, TYPE II (ICD-250.00) rec eye check yearly Her updated medication list for this problem includes:    Altace 5 Mg Caps (Ramipril) .Marland Kitchen... Take 1 capsule by mouth once a day    Glucophage 500 Mg Tabs (Metformin hcl) .Marland Kitchen... 2 by mouth with breakfast; 2 tabs by mouth with dinner  Orders: TLB-A1C / Hgb A1C (Glycohemoglobin) (83036-A1C) Venipuncture ([address])  Labs Reviewed: HgBA1c: 6.8 (06/09/2007)   Creat: 0.7 (04/06/2007)   Microalbumin: 1.5 (04/06/2007)   Problem # 3:  HYPERLIPIDEMIA (ICD-272.4) tolerates meds well Her updated medication list for this problem includes:    Zocor 20 Mg Tabs (Simvastatin) .Marland Kitchen... 1 by mouth once daily  Orders: TLB-ALT (SGPT) (  84460-ALT) TLB-AST (SGOT) (84450-SGOT) TLB-Lipid Panel (80061-LIPID)  Labs Reviewed: Chol: 215 (06/09/2007)   HDL: 43.4 (06/09/2007)   LDL: DEL (06/09/2007)   TG: 186 (06/09/2007) SGOT: 34 (06/09/2007)   SGPT: 37 (06/09/2007)   Problem # 4:  HYPERTENSION (ICD-401.9) at goal Her updated medication list for this problem includes:    Altace 5 Mg Caps (Ramipril) .Marland Kitchen... Take 1 capsule by mouth once a day    Atenolol 50 Mg Tabs (Atenolol) .Marland Kitchen... Take 1 tablet once a day    Hydrochlorothiazide 25 Mg Tabs (Hydrochlorothiazide) .Marland Kitchen... 1 by mouth once daily  Orders: TLB-BMP (Basic Metabolic Panel-BMET) (80048-METABOL)  BP today: 128/80 Prior BP: 140/80 (06/09/2007)  Labs Reviewed: Creat: 0.7 (04/06/2007) Chol: 215 (06/09/2007)   HDL: 43.4 (06/09/2007)   LDL: DEL (06/09/2007)   TG: 186 (06/09/2007)   Problem # 5:  tremor on exam minimal if any hand tremor moderate caffeine, observe  Problem # 6:  needs hyosciamine , uses rarely prn: done  Complete Medication List: 1)  Altace 5 Mg Caps (Ramipril) .... Take 1 capsule by mouth once a day 2)  Atenolol 50 Mg Tabs (Atenolol) .... Take 1 tablet  once a day 3)  Cymbalta 60 Mg Cpep (Duloxetine hcl) .... Take 1 capsule by mouth once a day 4)  Hydrochlorothiazide 25 Mg Tabs (Hydrochlorothiazide) .Marland Kitchen.. 1 by mouth once daily 5)  Glucophage 500 Mg Tabs (Metformin hcl) .... 2 by mouth with breakfast; 2 tabs by mouth with dinner 6)  Requip 1 Mg Tabs (Ropinirole hcl) .Marland Kitchen.. 1 by mouth qhs 7)  Levsin 0.125 Mg Tabs (Hyoscyamine sulfate) .... Prn 8)  Concerta 18 Mg Tbcr (Methylphenidate hcl) .Marland Kitchen.. 1 by mouth qd 9)  Prilosec 20 Mg Cpdr (Omeprazole) .Marland Kitchen.. 1 by mouth qd 10)  Abilify 5 Mg Tabs (Aripiprazole) .Marland Kitchen.. 1 by mouth qd 11)  Grape Seed Extract 100mg   .... Bid 12)  Zocor 20 Mg Tabs (Simvastatin) .Marland Kitchen.. 1 by mouth once daily 13)  Onetouch Ultra Test Strp (Glucose blood) .... Use as directed 14)  Hyoscyamine Sulfate 0.125 Mg Tbdp (Hyoscyamine sulfate) .Marland Kitchen.. 1 by mouth q 4 hours as needed   Patient Instructions: 1)  Please schedule a follow-up appointment in 4 months.   Prescriptions: HYOSCYAMINE SULFATE 0.125 MG TBDP (HYOSCYAMINE SULFATE) 1 by mouth q 4 hours as needed  #90 x 0   Entered and Authorized by:   Nolon Rod. Jamauri Kruzel MD   Signed by:   Nolon Rod. Ryliee Figge MD on 10/09/2007   Method used:   Print then Give to Patient   RxID:   [id number]  ]

## 2010-02-06 NOTE — Miscellaneous (Signed)
  Clinical Lists Changes  Medications: Changed medication from GLUCOPHAGE 500 MG  TABS (METFORMIN HCL) bid to GLUCOPHAGE 500 MG  TABS (METFORMIN HCL) 1 by mouth with breakfast; 2 tabs by mouth with dinner

## 2010-02-06 NOTE — Progress Notes (Signed)
Summary: Medication List   Phone Note Call from Patient Call back at Home Phone [phone]   Caller: Patient Call For: Dr. Russella Dar Reason for Call: Talk to Nurse Summary of Call: Pt wants to make you aware she is taking Requip 1 mg at bedtime, Simzastin 20mg . Add to med. list Initial call taken by: Karna Christmas,  April 05, 2009 11:01 AM  Follow-up for Phone Call        pt instructed to write additional meds down and bring day of procedure.  Informed pt that medicicines were okay to take. Follow-up by: Ezra Sites RN,  April 05, 2009 1:03 PM

## 2010-02-06 NOTE — Assessment & Plan Note (Signed)
Summary: UTI/ALR   Vital Signs:  Patient Profile:   65 Years Old Female Weight:      209.2 pounds Temp:     99.3 degrees F BP sitting:   140 / 100  Vitals Entered By: Shary Decamp (October 17, 2006 10:13 AM)                 Chief Complaint:  ha, c/o back pain (worse on left side), feels bad, and +fever x 2-3 days.  History of Present Illness: not feeling well for 3 days mild headache "on top of the head", no worse of life mid-low back pain, mild increased temperature up to 99.1.  Current Allergies (reviewed today): * PENICILLINS GROUP Updated/Current Medications (including changes made in today's visit):  ALTACE 5 MG CAPS (RAMIPRIL) Take 1 capsule by mouth once a day ATENOLOL 50 MG TABS (ATENOLOL) Take 1 tablet once a day HYOSCYAMINE SULFATE 0.125 MG SUBL (HYOSCYAMINE SULFATE)  CYMBALTA 60 MG CPEP (DULOXETINE HCL) Take 1 capsule by mouth once a day RITALIN 10 MG TABS (METHYLPHENIDATE HCL) bid HYDROCHLOROTHIAZIDE 25 MG  TABS (HYDROCHLOROTHIAZIDE) prn GLUCOPHAGE 500 MG  TABS (METFORMIN HCL) bid REQUIP 1 MG  TABS (ROPINIROLE HCL) 1 by mouth qhs LEVSIN 0.125 MG  TABS (HYOSCYAMINE SULFATE) prn ZANTAC 150 MG  CAPS (RANITIDINE HCL)       Review of Systems       denies dysuria or hematuria no running nose sore throat or cough two weeks ago she had mild vaginal discharge that self resolve no changes in the skin consistent with cellulitis no abdominal pain vomiting.  Nausea x 1 yesterday   Physical Exam  General:     alert and well-developed.  no apparent distress Eyes:     no pedal or jaundice Ears:     normal Nose:     no congested Mouth:     not red or discharge Lungs:     normal respiratory effort, no intercostal retractions, and no accessory muscle use.   Heart:     normal rate, regular rhythm, and no murmur.   Abdomen:     soft, non-tender, no hepatomegaly, and no splenomegaly.   no CVA tenderness    Impression & Recommendations:  Problem # 1:   VIRAL INFECTION-UNSPEC (ICD-079.99) viral syndrome? UA (-) see instructions  Complete Medication List: 1)  Altace 5 Mg Caps (Ramipril) .... Take 1 capsule by mouth once a day 2)  Atenolol 50 Mg Tabs (Atenolol) .... Take 1 tablet once a day 3)  Hyoscyamine Sulfate 0.125 Mg Subl (Hyoscyamine sulfate) 4)  Cymbalta 60 Mg Cpep (Duloxetine hcl) .... Take 1 capsule by mouth once a day 5)  Ritalin 10 Mg Tabs (Methylphenidate hcl) .... Bid 6)  Hydrochlorothiazide 25 Mg Tabs (Hydrochlorothiazide) .... Prn 7)  Glucophage 500 Mg Tabs (Metformin hcl) .... Bid 8)  Requip 1 Mg Tabs (Ropinirole hcl) .Marland Kitchen.. 1 by mouth qhs 9)  Levsin 0.125 Mg Tabs (Hyoscyamine sulfate) .... Prn 10)  Zantac 150 Mg Caps (Ranitidine hcl)   Patient Instructions: 1)  Get plenty of rest, drink lots of clear liquids, and use tylenol  for fever and comfort. return in 7-10 days if you're not better:sooner if you're feeliong worse.    ] Laboratory Results   Urine Tests   Date/Time Reported: 10:16 AM    Routine Urinalysis   Glucose: negative   (Normal Range: Negative) Bilirubin: negative   (Normal Range: Negative) Ketone: negative   (Normal Range: Negative) Spec. Gravity: 1.015   (  Normal Range: 1.003-1.035) Blood: negative   (Normal Range: Negative) pH: 5.0   (Normal Range: 5.0-8.0) Protein: negative   (Normal Range: Negative) Urobilinogen: negative   (Normal Range: 0-1) Nitrite: negative   (Normal Range: Negative) Leukocyte Esterace: negative   (Normal Range: Negative)

## 2010-02-06 NOTE — Letter (Signed)
Summary: Results Follow up Letter  [facility]  [address] [place], [place] [address]   Phone: [phone]  Fax: [phone]    10/15/2007 MRN: [id number]  [name] [address] [place], [place]  [address]  Dear [name],  The following are the results of your recent test(s):  Test         Result    Pap Smear:        Normal _____  Not Normal _____ Comments: ______________________________________________________ Cholesterol: LDL(Bad cholesterol):         Your goal is less than:         HDL (Good cholesterol):       Your goal is more than: Comments:  ______________________________________________________ Mammogram:        Normal _____  Not Normal _____ Comments:  ___________________________________________________________________ Hemoccult:        Normal _____  Not normal _______ Comments:    _____________________________________________________________________   Attached is a copy of your lab results from 10/09/07.  All labs were normal.  Dr Drue Novel would like for you to work on your diet & exercise to help improve your diabetes.  Please call me if you have any questions. Alena Bills

## 2010-02-06 NOTE — Assessment & Plan Note (Signed)
Summary: 36MONTH/TL  Medications Added ALTACE 5 MG CAPS (RAMIPRIL) Take 1 capsule by mouth once a day ATENOLOL 50 MG TABS (ATENOLOL) Take 1 tablet once a day HYOSCYAMINE SULFATE 0.125 MG SUBL (HYOSCYAMINE SULFATE)  CYMBALTA 60 MG CPEP (DULOXETINE HCL) Take 1 capsule by mouth once a day RITALIN 10 MG TABS (METHYLPHENIDATE HCL) bid HYDROCHLOROTHIAZIDE 25 MG  TABS (HYDROCHLOROTHIAZIDE) prn GLUCOPHAGE 500 MG  TABS (METFORMIN HCL) bid REQUIP 1 MG  TABS (ROPINIROLE HCL) 1 by mouth qhs LEVSIN 0.125 MG  TABS (HYOSCYAMINE SULFATE) prn ZANTAC 150 MG  CAPS (RANITIDINE HCL)       Allergies Added: * PENICILLINS GROUP  Vital Signs:  Patient Profile:   65 Years Old Female Weight:      212.6 pounds Temp:     98.9 degrees F oral Pulse rate:   66 / minute Pulse rhythm:   regular BP sitting:   122 / 80  Vitals Entered By: Shary Decamp (August 22, 2006 12:46 PM)               Chief Complaint:  rov dm; c/o nausea off & on x 2-3 weeks- would feel better after eating; +loose stool.  History of Present Illness: routine office visit for three weeks she had some on-and-off nausea, diarrhea.  She also has some heartburn that decrease with over-the-counter Zantac. Overall feels better for the last two days.no further diarrhea  Current Allergies (reviewed today): * PENICILLINS GROUP Updated/Current Medications (including changes made in today's visit):  ALTACE 5 MG CAPS (RAMIPRIL) Take 1 capsule by mouth once a day ATENOLOL 50 MG TABS (ATENOLOL) Take 1 tablet once a day HYOSCYAMINE SULFATE 0.125 MG SUBL (HYOSCYAMINE SULFATE)  CYMBALTA 60 MG CPEP (DULOXETINE HCL) Take 1 capsule by mouth once a day RITALIN 10 MG TABS (METHYLPHENIDATE HCL) bid HYDROCHLOROTHIAZIDE 25 MG  TABS (HYDROCHLOROTHIAZIDE) prn GLUCOPHAGE 500 MG  TABS (METFORMIN HCL) bid REQUIP 1 MG  TABS (ROPINIROLE HCL) 1 by mouth qhs LEVSIN 0.125 MG  TABS (HYOSCYAMINE SULFATE) prn ZANTAC 150 MG  CAPS (RANITIDINE HCL)     Family  History:    mother had lung cancer.  She was a smoker.    A constant had ovarian cancer.    Father had colon cancer    no history of breast cancer.    Diabetes runs in the family    Family History of Colon CA 1st degree relative <60  Social History:    Married    three children    patient is a retired Engineer, civil (consulting)   Risk Factors:  Tobacco use:  quit    Year quit:  1977 Alcohol use:  yes    Comments:  seldom   Review of Systems       denies fever, both in the stools, abdominal pain. Her CBGs are in the range of the low 100s   Physical Exam  General:     alert, well-developed, and well-nourished.   Lungs:     normal respiratory effort, no intercostal retractions, and no accessory muscle use.   Heart:     normal rate, regular rhythm, and no murmur.   Abdomen:     soft, no hepatomegaly, and no splenomegaly.  not distended.  Slight tenderness at the epigastric area without mass or rebound Pulses:     normal pedal pulses Extremities:     no edema  Diabetes Management Exam:    Foot Exam (with socks and/or shoes not present):       Sensory-Monofilament:  Left foot: normal          Right foot: normal       Inspection:          Left foot: abnormal             Comments: callous @ both great toes    Impression & Recommendations:  Problem # 1:  DIABETES MELLITUS, TYPE II (ICD-250.00) status post diabetes teaching. She is advised to do eye checkup yearly  Her updated medication list for this problem includes:    Altace 5 Mg Caps (Ramipril) .Marland Kitchen... Take 1 capsule by mouth once a day    Glucophage 500 Mg Tabs (Metformin hcl) ..... Bid  Labs Reviewed: HgBA1c: 7.2 (05/29/2006)   Creat: 0.7 (02/17/2006)      Problem # 2:  HYPERTENSION (ICD-401.9)  Her updated medication list for this problem includes:    Altace 5 Mg Caps (Ramipril) .Marland Kitchen... Take 1 capsule by mouth once a day    Atenolol 50 Mg Tabs (Atenolol) .Marland Kitchen... Take 1 tablet once a day    Hydrochlorothiazide 25 Mg  Tabs (Hydrochlorothiazide) .Marland Kitchen... Prn  BP today: 122/80  Labs Reviewed: Creat: 0.7 (02/17/2006) Chol: 207 (02/17/2006)   HDL: 42.7 (02/17/2006)   LDL: DEL (02/17/2006)   TG: 196 (02/17/2006)   Problem # 3:  HYPERLIPIDEMIA (ICD-272.4) on diet only Labs Reviewed: Chol: 207 (02/17/2006)   HDL: 42.7 (02/17/2006)   LDL: DEL (02/17/2006)   TG: 196 (02/17/2006) SGOT: 31 (02/17/2006)   SGPT: 35 (02/17/2006)   Problem # 4:  primary care issues. recommend to have gynecological exam, she will look for a gynecologist at her convenience. Had a tetanus shot in 2007.   Problem # 5:  DYSPEPSIA (ICD-536.8) see instructions  Complete Medication List: 1)  Altace 5 Mg Caps (Ramipril) .... Take 1 capsule by mouth once a day 2)  Atenolol 50 Mg Tabs (Atenolol) .... Take 1 tablet once a day 3)  Hyoscyamine Sulfate 0.125 Mg Subl (Hyoscyamine sulfate) 4)  Cymbalta 60 Mg Cpep (Duloxetine hcl) .... Take 1 capsule by mouth once a day 5)  Ritalin 10 Mg Tabs (Methylphenidate hcl) .... Bid 6)  Hydrochlorothiazide 25 Mg Tabs (Hydrochlorothiazide) .... Prn 7)  Glucophage 500 Mg Tabs (Metformin hcl) .... Bid 8)  Requip 1 Mg Tabs (Ropinirole hcl) .Marland Kitchen.. 1 by mouth qhs 9)  Levsin 0.125 Mg Tabs (Hyoscyamine sulfate) .... Prn 10)  Zantac 150 Mg Caps (Ranitidine hcl)   Patient Instructions: 1)  continue with you're healthy lifestyle. 2)  recommend Prilosec over-the-counter one tablet every morning before breakfast.  If you continue having stomach problems for the next two weeks, let me know.  Also let me know if the symptoms increase, you have fever, blood in the stools or more diarrhea. 3)  Please schedule a follow-up appointment in 3 months. please come back fasting

## 2010-02-06 NOTE — Procedures (Signed)
Summary: Colonoscopy  Patient: [name] Note: All result statuses are Final unless otherwise noted.  Tests: (1) Colonoscopy (COL)   COL Colonoscopy           DONE     [facility]     [address]     [place], [place]  [address]           COLONOSCOPY PROCEDURE REPORT           PATIENT:  [name], [name]  MR#:  [id number]     BIRTHDATE:  01/05/1946, 64 yrs. old  GENDER:  female           ENDOSCOPIST:  Judie Petit T. Russella Dar, MD, [facility]           PROCEDURE DATE:  04/18/2009     PROCEDURE:  Colonoscopy with biopsy and snare polypectomy     ASA CLASS:  Class II     INDICATIONS:  1) follow-up of polyp, adenomatous polyp, 03/1988.           MEDICATIONS:   Fentanyl 75 mcg IV, Versed 8 mg IV           DESCRIPTION OF PROCEDURE:   After the risks benefits and     alternatives of the procedure were thoroughly explained, informed     consent was obtained.  Digital rectal exam was performed and     revealed no abnormalities.   The LB [id number] endoscope     was introduced through the anus and advanced to the cecum, which     was identified by both the appendix and ileocecal valve, without     limitations.  The quality of the prep was excellent, using     MoviPrep.  The instrument was then slowly withdrawn as the colon     was fully examined.     <<PROCEDUREIMAGES>>           FINDINGS:  A sessile polyp was found in the sigmoid colon. It was     4 mm in size. The polyp was removed using cold biopsy forceps.  A     sessile polyp was found in the rectum. It was 5 mm in size. Polyp     was snared without cautery. Retrieval was successful. This was     otherwise a normal examination of the colon. Retroflexed views in     the rectum revealed no abnormalities. The time to cecum =  3.75     minutes. The scope was then withdrawn (time =  12.5  min) from the     patient and the procedure completed.           COMPLICATIONS:  None           ENDOSCOPIC IMPRESSION:     1) 4 mm sessile  polyp in the sigmoid colon     2) 5 mm sessile polyp in the rectum           RECOMMENDATIONS:     1) Await pathology results     2) Repeat Colonoscopy in 5 years.           Venita Lick. Russella Dar, MD, [facility]           CC: Willow Ora, MD           n.     Rosalie DoctorVenita Lick. Almarie Kurdziel at 04/18/2009 09:49 AM           [place], [place], [place] [id number]  Note: An exclamation mark Marland Kitchen)  indicates a result that was not dispersed into the flowsheet. Document Creation Date: 04/18/2009 9:49 AM _______________________________________________________________________  (1) Order result status: Final Collection or observation date-time: 04/18/2009 09:42 Requested date-time:  Receipt date-time:  Reported date-time:  Referring Physician:   Ordering Physician: Claudette Head [phone]) Specimen Source:  Source: Launa Grill Order Number: [id number] Lab site:   Appended Document: Colonoscopy     Procedures Next Due Date:    Colonoscopy: 04/2014

## 2010-02-06 NOTE — Progress Notes (Signed)
Summary: ?magnesium  Phone Note Call from Patient Call back at Work Phone [phone]   Summary of Call: PATIENT LEFT MESSAGE ON VM: ok to take magnesium 500mg  daily? Shary Decamp  April 19, 2009 2:20 PM I see no contraindication to take a single over-the-counter magnesium tablet daily we can check a MG level  on return to the office Ihsan Nomura E. Alisia Vanengen MD  April 19, 2009 4:23 PM  discussed with pt Shary Decamp  April 19, 2009 4:27 PM

## 2010-02-06 NOTE — Progress Notes (Signed)
Summary: bone density results  Phone Note Outgoing Call   Details for Reason: **BONE DENSITY RESULTS** advise patient has osteopenia recommend daily exercise, calcium and vitamin D. Medication? we could start a medication like Actonel particularly if she is unable to exercise, let me know recheck a bone density test in  two years Signed by [facility] E. Paz MD on 07/24/2007 at 5:33 PM Summary of Call: DISCUSSED WITH PT PT OPTED NOT TO TRY MEDICATION @ THIS TIME -- SHE WILL DO EXERCISE, CAL, VITAMIN D ........Marland KitchenShary Decamp  July 27, 2007 3:56 PM

## 2010-02-06 NOTE — Letter (Signed)
Summary: Diabetic Instructions  [facility]  [address] [place], [place] [address]   Phone: [phone]  Fax: [phone]    [name] [birth year] MRN: [id number]   _  _   ORAL DIABETIC MEDICATION INSTRUCTIONS  The day before your procedure:   Take your diabetic pill as you do normally  The day of your procedure:   Do not take your diabetic pill    We will check your blood sugar levels during the admission process and again in Recovery before discharging you home  ________________________________________________________________________

## 2010-02-06 NOTE — Progress Notes (Signed)
Summary: refill - dr Drue Novel  Phone Note Refill Request   Refills Requested: Medication #1:  HYDROCHLOROTHIAZIDE 25 MG  TABS 1 by mouth once daily [facility] - [address] - fax [phone]--- phone [phone]  Initial call taken by: Okey Regal [place],  May 20, 2007 3:22 PM      Prescriptions: HYDROCHLOROTHIAZIDE 25 MG  TABS (HYDROCHLOROTHIAZIDE) 1 by mouth once daily  #30 x 3   Entered by:   Shary Decamp   Authorized by:   [name] MD   Signed by:   Shary Decamp on 05/20/2007   Method used:   Electronically sent to ...       [facility] [address]  [id number]       [address]       [place], [place]  [address]       Ph: [phone] or [phone]       Fax: [phone]   RxID:   [id number]

## 2010-02-06 NOTE — Assessment & Plan Note (Signed)
Summary: for shingle shot--ph   Nurse Visit     Allergies: 1)  * Penicillins Group    Immunizations Administered:  Zostavax # 1:    Vaccine Type: Zostavax    Site: left deltoid    Mfr: Merck    Dose: 0.51mL    Route: IM    Given by: Floydene Flock CMA    Exp. Date: 03/05/2009    Lot #: [id number]   Orders Added: 1)  Zoster (Shingles) Vaccine Live [90736] 2)  Admin 1st Vaccine [email or web address]    ]

## 2010-02-06 NOTE — Assessment & Plan Note (Signed)
Summary: ACUTE ONLY FOR BACK PAIN//PH   Vital Signs:  Patient Profile:   65 Years Old Female Height:     66.75 inches Weight:      212.8 pounds Temp:     97.4 degrees F oral BP sitting:   124 / 80  Vitals Entered By: Shary Decamp (November 24, 2007 3:20 PM)                 Chief Complaint:  low back pain x 3 weeks.  History of Present Illness: started w/ pain around the R hip several weeks ago after the "crop walk", then the pain moved to the lower back, no radiation, no injury or fall (although she miss step x 2 and came down hard on the R leg)    Current Allergies: * PENICILLINS GROUP  Past Medical History:    Reviewed history from 10/09/2007 and no changes required:       DIABETES MELLITUS, TYPE II (ICD-250.00)       DEPRESSION        HYPERLIPIDEMIA (ICD-272.4)       HYPERTENSION (ICD-401.9)       IBS and dyspepsia       SYNDROME, RESTLESS LEGS       COLONIC POLYPS        Osteopenia DEXA 06-2007, Rx Ca Vit D  Past Surgical History:    Reviewed history from 06/09/2007 and no changes required:       fibroid removal (uterus) in the 90s       Tonsillectomy     Review of Systems  GI      Denies abdominal pain.  GU      Denies dysuria and hematuria.  Neuro      no LE paresthesias   Physical Exam  General:     alert and well-developed.   Abdomen:     soft, non-tender, no distention, and no masses.   Msk:     no tender over spine  Extremities:     no pretibial edema bilaterally  hip rotation: minimal if any pain @ the R Neurologic:     alert & oriented X3, strength normal in all extremities, and gait normal.  (-) streight leg test Reflexes normal except for L ankle (slightly  diminish)    Impression & Recommendations:  Problem # 1:  BACK PAIN (ICD-724.5) likely mechanical pain, no red flags that indicate a serious etiology UCX pending see instructions  Her updated medication list for this problem includes:    Vicodin 5-500 Mg Tabs  (Hydrocodone-acetaminophen) .Marland Kitchen... 1 by mouth every 6 hopurs as needed pain   Complete Medication List: 1)  Altace 5 Mg Caps (Ramipril) .... Take 1 capsule by mouth once a day 2)  Atenolol 50 Mg Tabs (Atenolol) .... Take 1 tablet once a day 3)  Cymbalta 60 Mg Cpep (Duloxetine hcl) .... Take 1 capsule by mouth once a day 4)  Hydrochlorothiazide 25 Mg Tabs (Hydrochlorothiazide) .Marland Kitchen.. 1 by mouth once daily 5)  Glucophage 500 Mg Tabs (Metformin hcl) .... 2 by mouth with breakfast; 2 tabs by mouth with dinner 6)  Requip 1 Mg Tabs (Ropinirole hcl) .Marland Kitchen.. 1 by mouth qhs 7)  Levsin 0.125 Mg Tabs (Hyoscyamine sulfate) .... Prn 8)  Concerta 18 Mg Tbcr (Methylphenidate hcl) .Marland Kitchen.. 1 by mouth qd 9)  Prilosec 20 Mg Cpdr (Omeprazole) .Marland Kitchen.. 1 by mouth qd 10)  Abilify 5 Mg Tabs (Aripiprazole) .Marland Kitchen.. 1 by mouth qd 11)  Grape Seed Extract 100mg   .Marland KitchenMarland KitchenMarland Kitchen  Bid 12)  Zocor 20 Mg Tabs (Simvastatin) .Marland Kitchen.. 1 by mouth once daily 13)  Onetouch Ultra Test Strp (Glucose blood) .... Use as directed 14)  Hyoscyamine Sulfate 0.125 Mg Tbdp (Hyoscyamine sulfate) .Marland Kitchen.. 1 by mouth q 4 hours as needed 15)  Vicodin 5-500 Mg Tabs (Hydrocodone-acetaminophen) .Marland Kitchen.. 1 by mouth every 6 hopurs as needed pain  Other Orders: UA Dipstick w/o Micro (manual) (10932) T-Urine Culture (Spectrum Order) [id number]) T-Urine Microscopic [id number])   Patient Instructions: 1)  rest 2)  warm compress  3)  Take 400 mg of Ibuprofen (Advil, Motrin) with food every  6 hours as needed for relief of pain, stop ibuprofen if it irritate your stomach 4)  if the pain continue: vicodin as needed  (drowsiness) 5)  call if no better in 3 weeks    Prescriptions: VICODIN 5-500 MG TABS (HYDROCODONE-ACETAMINOPHEN) 1 by mouth every 6 hopurs as needed pain  #30 x 0   Entered and Authorized by:   Nolon Rod. Jeremaine Maraj MD   Signed by:   Nolon Rod. Vannia Pola MD on 11/24/2007   Method used:   Print then Give to Patient   RxID:   [id number]  ] Laboratory Results   Urine  Tests    Routine Urinalysis   Glucose: negative   (Normal Range: Negative) Bilirubin: negative   (Normal Range: Negative) Ketone: negative   (Normal Range: Negative) Spec. Gravity: 1.010   (Normal Range: 1.003-1.035) Blood: trace   (Normal Range: Negative) pH: 5.0   (Normal Range: 5.0-8.0) Protein: negative   (Normal Range: Negative) Urobilinogen: 0.2   (Normal Range: 0-1) Nitrite: negative   (Normal Range: Negative) Leukocyte Esterace: small   (Normal Range: Negative)

## 2010-02-06 NOTE — Miscellaneous (Signed)
Summary: neg diabetic eye exam, + early cataracts    Clinical Lists Changes  Observations: Added new observation of DMEYEEXAMNXT: 09/2009 (08/25/2008 15:15) Added new observation of DMEYEEXMRES: normal (08/25/2008 15:15) Added new observation of EYE EXAM BY: shapiro (08/25/2008 15:15) Added new observation of DIAB EYE EX: normal (08/25/2008 15:15)       Diabetes Management Exam:    Eye Exam:       Eye Exam done elsewhere          Date: 08/25/2008          Results: normal          Done by: Nile Riggs

## 2010-02-06 NOTE — Assessment & Plan Note (Signed)
Summary: congested,cough/cbs   Vital Signs:  Patient profile:   65 year old female Weight:      198.38 pounds O2 Sat:      97 % on Room air Temp:     98.1 degrees F oral Pulse rate:   83 / minute Pulse rhythm:   regular BP sitting:   126 / 86  (left arm) Cuff size:   large  Vitals Entered By: Army Fossa CMA (November 20, 2009 2:19 PM)  O2 Flow:  Room air CC: Pt here c/o chest congestion. Comments x 1 week [facility] [address]    History of Present Illness: developed  a cold 8 days ago, initially had sore throat, then she developed a cough. Taking coricidn  and Mucinex over-the-counter  ROS No fevers No wheezing or shortness of breath No nausea, vomiting, myalgias she has developed some sputum, beige in  color  Current Medications (verified): 1)  Zocor 20 Mg  Tabs (Simvastatin) .Marland Kitchen.. 1 By Mouth Once Daily 2)  Altace 5 Mg Caps (Ramipril) .... Take 1 Capsule By Mouth Once A Day 3)  Atenolol 50 Mg Tabs (Atenolol) .... Take 1 Tablet Once A Day 4)  Hydrochlorothiazide 25 Mg  Tabs (Hydrochlorothiazide) .... As Needed For Edema (Takes Rarely) 5)  Glucophage 500 Mg  Tabs (Metformin Hcl) .... 2 By Mouth With Breakfast; 2 Tabs By Mouth With Dinner 6)  Cymbalta 60 Mg Cpep (Duloxetine Hcl) .... Take 1 Capsule By Mouth Once A Day 7)  Concerta 54 Mg Cr-Tabs (Methylphenidate Hcl) .Marland Kitchen.. 1 By Mouth Once Daily (Prescribed By Psych) 8)  Requip 1 Mg  Tabs (Ropinirole Hcl) .Marland Kitchen.. 1 By Mouth Qhs 9)  Prilosec 20 Mg  Cpdr (Omeprazole) .Marland Kitchen.. 1 By Mouth Qd 10)  Onetouch Ultra Test  Strp (Glucose Blood) .... Use As Directed  Allergies (verified): 1)  * Penicillins Group  Past History:  Past Medical History: Reviewed history from 05/24/2009 and no changes required. DIABETES  DEPRESSION -- sees Dr Benjie Karvonen  HYPERLIPIDEMIA  HYPERTENSION  IBS and dyspepsia SYNDROME, RESTLESS LEGS COLONIC POLYPS  Osteopenia DEXA 06-2007, Rx Ca Vit D  Past Surgical History: Reviewed history from 06/09/2007 and no  changes required. fibroid removal (uterus) in the 90s Tonsillectomy  Social History: Reviewed history from 05/24/2009 and no changes required. 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  no distress Head:  face symmetric Ears:  R ear normal and L ear normal.   Nose:  congested Mouth:  no redness or discharge Lungs:  normal respiratory effort, no intercostal retractions, no accessory muscle use, and normal breath sounds.   Heart:  normal rate, regular rhythm, and no murmur.     Impression & Recommendations:  Problem # 1:  VIRAL INFECTION-UNSPEC (ICD-079.99) likely a viral URI, see  instructions Her updated medication list for this problem includes:    Aspirin 81 Mg Tbec (Aspirin) ..... One daily  Complete Medication List: 1)  Zocor 20 Mg Tabs (Simvastatin) .Marland Kitchen.. 1 by mouth once daily 2)  Altace 5 Mg Caps (Ramipril) .... Take 1 capsule by mouth once a day 3)  Atenolol 50 Mg Tabs (Atenolol) .... Take 1 tablet once a day 4)  Hydrochlorothiazide 25 Mg Tabs (Hydrochlorothiazide) .... As needed for edema (takes rarely) 5)  Glucophage 500 Mg Tabs (Metformin hcl) .... 2 by mouth with breakfast;  2 tabs by mouth with dinner 6)  Cymbalta 60 Mg Cpep (Duloxetine hcl) .... Take 1 capsule by mouth once a day 7)  Concerta 54 Mg Cr-tabs (Methylphenidate hcl) .Marland Kitchen.. 1 by mouth once daily (prescribed by psych) 8)  Requip 1 Mg Tabs (Ropinirole hcl) .Marland Kitchen.. 1 by mouth qhs 9)  Prilosec 20 Mg Cpdr (Omeprazole) .Marland Kitchen.. 1 by mouth qd 10)  Onetouch Ultra Test Strp (Glucose blood) .... Use as directed 11)  Aspirin 81 Mg Tbec (Aspirin) .... One daily 12)  Zithromax Z-pak 250 Mg Tabs (Azithromycin) .... As direceted  Patient Instructions: 1)  rest, fluids, tylenol 2)  continue with OTCs for cough 3)  if no better in 3 to 4 days start zpack 4)  call any  time if symptoms severe or no better in 2 weeks  Prescriptions: ZITHROMAX Z-PAK 250 MG TABS (AZITHROMYCIN) as direceted  #1 x 0   Entered and Authorized by:   [name] MD   Signed by:   [name] MD on 11/20/2009   Method used:   Print then Give to Patient   RxID:   [id number]    Orders Added: 1)  Est. Patient Level III [14782]   Immunization History:  Influenza Immunization History:    Influenza:  had at work few weeks ago (11/20/2009)   Immunization History:  Influenza Immunization History:    Influenza:  had at work few weeks ago (11/20/2009)

## 2010-02-06 NOTE — Letter (Signed)
Summary: DM teaching  External Correspondence--[facility]   Imported By: Freddy Jaksch 10/02/2006 15:38:09  _____________________________________________________________________  External Attachment:    Type:   Image     Comment:   External Document

## 2010-02-06 NOTE — Progress Notes (Signed)
Summary: checking on pt  Phone Note Outgoing Call   Call placed by: Shary Decamp,  October 21, 2006 11:01 AM Summary of Call: Called to check on pt -- states she is feeling much better; advised to call if she has any additional problems/concerns ..................................................................Marland KitchenShary Decamp  October 21, 2006 11:01 AM

## 2010-02-06 NOTE — Miscellaneous (Signed)
Summary: new glucophage rx - pt aware  Clinical Lists Changes  Medications: Changed medication from GLUCOPHAGE 500 MG  TABS (METFORMIN HCL) 1 by mouth with breakfast; 2 tabs by mouth with dinner to GLUCOPHAGE 500 MG  TABS (METFORMIN HCL) 2 by mouth with breakfast; 2 tabs by mouth with dinner - Signed Rx of GLUCOPHAGE 500 MG  TABS (METFORMIN HCL) 2 by mouth with breakfast; 2 tabs by mouth with dinner;  #120 x 6;  Signed;  Entered by: Shary Decamp;  Authorized by: Nolon Rod Paz MD;  Method used: Electronic    Prescriptions: GLUCOPHAGE 500 MG  TABS (METFORMIN HCL) 2 by mouth with breakfast; 2 tabs by mouth with dinner  #120 x 6   Entered by:   Shary Decamp   Authorized by:   [name] MD   Signed by:   Shary Decamp on 04/13/2007   Method used:   Electronically sent to ...       [facility]  [address]  [id number]       [address]       [place], [place]  [address]       Ph: [phone] or [phone]       Fax: [phone]   RxID:   [id number]

## 2010-02-08 NOTE — Progress Notes (Signed)
Summary: rx request  Phone Note Call from Patient Call back at Work Phone [phone]   Summary of Call: Patient left message on triage that she was seen yesterday and her cold has developed into a sinus infection and down in her chest more. She notes that she feels terrible and had to take another day off work.  She is requesting prescription be sent to her pharmacy. Please advise. Initial call taken by: Lucious Groves CMA,  January 24, 2010 11:19 AM  Follow-up for Phone Call        ok to call additional work excuse if no better , please call a zpack Nelly Scriven E. Ikey Omary MD  January 24, 2010 6:54 PM   Additional Follow-up for Phone Call Additional follow up Details #1::        Patient notes that she feels a little better today, but would still like ABX. [facility] [address] Additional Follow-up by: Lucious Groves CMA,  January 25, 2010 9:33 AM    New/Updated Medications: ZITHROMAX Z-PAK 250 MG TABS (AZITHROMYCIN) as directed. Prescriptions: ZITHROMAX Z-PAK 250 MG TABS (AZITHROMYCIN) as directed.  #1 x 0   Entered by:   Lucious Groves CMA   Authorized by:   Nolon Rod. Demarius Archila MD   Signed by:   Lucious Groves CMA on 01/25/2010   Method used:   Electronically to        [id number] (retail)       [address]       [place], [place]  [address]       Ph: [phone] or [phone]       Fax: [phone]   RxID:   [id number]

## 2010-02-08 NOTE — Assessment & Plan Note (Signed)
Summary: rto 4 months/cbs   Vital Signs:  Patient profile:   65 year old female Weight:      200.38 pounds Temp:     98.8 degrees F oral Pulse rate:   78 / minute Pulse rhythm:   regular BP sitting:   134 / 86  (left arm) Cuff size:   large  Vitals Entered By: Army Fossa CMA (January 23, 2010 3:39 PM) CC: Follow up meds. Comments also chest/head congestion started saturday [facility] [address]    History of Present Illness: followup from previous visit, she had chest pain, stress test negative Incidentally, she developed a cold a few days ago, she's having sinus congestion and clear nasal discharge.  Review of systems Her diet has not been healthy for the last few months, no amb. CBGs good medication compliance with all meds No ambulatory BPs Denies fevers Admits to  mild cough and chest congestion, some sore throat   Current Medications (verified): 1)  Zocor 20 Mg  Tabs (Simvastatin) .Marland Kitchen.. 1 By Mouth Once Daily 2)  Altace 5 Mg Caps (Ramipril) .... Take 1 Capsule By Mouth Once A Day 3)  Atenolol 50 Mg Tabs (Atenolol) .... Take 1 Tablet Once A Day 4)  Glucophage 500 Mg  Tabs (Metformin Hcl) .... 2 By Mouth With Breakfast; 2 Tabs By Mouth With Dinner 5)  Cymbalta 60 Mg Cpep (Duloxetine Hcl) .... Take 1 Capsule By Mouth Once A Day 6)  Concerta 54 Mg Cr-Tabs (Methylphenidate Hcl) .Marland Kitchen.. 1 By Mouth Once Daily (Prescribed By Psych) 7)  Requip 1 Mg  Tabs (Ropinirole Hcl) .Marland Kitchen.. 1 By Mouth Qhs 8)  Prilosec 20 Mg  Cpdr (Omeprazole) .Marland Kitchen.. 1 By Mouth Qd 9)  Onetouch Ultra Test  Strp (Glucose Blood) .... Use As Directed 10)  Aspirin 81 Mg Tbec (Aspirin) .... One Daily  Allergies (verified): 1)  * Penicillins Group  Past History:  Past Medical History: DIABETES  DEPRESSION -- sees Dr Benjie Karvonen  HYPERLIPIDEMIA  HYPERTENSION  IBS and dyspepsia SYNDROME, RESTLESS LEGS COLONIC POLYPS  Osteopenia DEXA 06-2007, and 10-11,  Rx Ca Vit D CP x 08 September 2009: stres test neg   Social  History: Reviewed history from 05/24/2009 and no changes required. 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   Head:  face symmetric, minimal tenderness in the left maxillary sinuses Ears:  R ear normal and L ear normal.   Nose:  slightly congested Mouth:  no redness or discharge Lungs:  normal respiratory effort, no intercostal retractions, no accessory muscle use, and normal breath sounds.   Heart:  normal rate, regular rhythm, and no murmur.     Impression & Recommendations:  Problem # 1:  CHEST PAIN (ICD-786.50) resolved, will call if symptoms resurface  Problem # 2:  VIRAL INFECTION-UNSPEC (ICD-079.99) see instructions  Her updated medication list for this problem includes:    Aspirin 81 Mg Tbec (Aspirin) ..... One daily  Problem # 3:  DIABETES MELLITUS, TYPE II (ICD-250.00) diet has been the best in the last few weeks, check an A1c, we'll adjust the medication if the A1c is more than 7.5. Otherwise we'll leave the medications as they are and work on diet. Her updated medication list for this problem includes:    Altace 5 Mg Caps (Ramipril) .Marland Kitchen... Take 1 capsule by mouth once a  day    Glucophage 500 Mg Tabs (Metformin hcl) .Marland Kitchen... 2 by mouth with breakfast; 2 tabs by mouth with dinner    Aspirin 81 Mg Tbec (Aspirin) ..... One daily  Orders: Venipuncture (16109) TLB-A1C / Hgb A1C (Glycohemoglobin) (83036-A1C)  Labs Reviewed: Creat: 0.6 (09/25/2009)     Last Eye Exam: normal (08/25/2008) Reviewed HgBA1c results: 7.1 (09/25/2009)  6.9 (05/24/2009)  Problem # 4:  HYPERTENSION (ICD-401.9) no change The following medications were removed from the medication list:    Hydrochlorothiazide 25 Mg Tabs (Hydrochlorothiazide) .Marland Kitchen... As needed for edema (takes rarely) Her updated medication list for this problem includes:     Altace 5 Mg Caps (Ramipril) .Marland Kitchen... Take 1 capsule by mouth once a day    Atenolol 50 Mg Tabs (Atenolol) .Marland Kitchen... Take 1 tablet once a day  BP today: 134/86 Prior BP: 126/86 (11/20/2009)  Labs Reviewed: K+: 4.7 (09/25/2009) Creat: : 0.6 (09/25/2009)   Chol: 138 (09/25/2009)   HDL: 42.70 (09/25/2009)   LDL: 76 (09/25/2009)   TG: 95.0 (09/25/2009)  Complete Medication List: 1)  Zocor 20 Mg Tabs (Simvastatin) .Marland Kitchen.. 1 by mouth once daily 2)  Altace 5 Mg Caps (Ramipril) .... Take 1 capsule by mouth once a day 3)  Atenolol 50 Mg Tabs (Atenolol) .... Take 1 tablet once a day 4)  Glucophage 500 Mg Tabs (Metformin hcl) .... 2 by mouth with breakfast; 2 tabs by mouth with dinner 5)  Cymbalta 60 Mg Cpep (Duloxetine hcl) .... Take 1 capsule by mouth once a day 6)  Concerta 54 Mg Cr-tabs (Methylphenidate hcl) .Marland Kitchen.. 1 by mouth once daily (prescribed by psych) 7)  Requip 1 Mg Tabs (Ropinirole hcl) .Marland Kitchen.. 1 by mouth qhs 8)  Prilosec 20 Mg Cpdr (Omeprazole) .Marland Kitchen.. 1 by mouth qd 9)  Onetouch Ultra Test Strp (Glucose blood) .... Use as directed 10)  Aspirin 81 Mg Tbec (Aspirin) .... One daily  Other Orders: Specimen Handling (60454)  Patient Instructions: 1)  rest, fluids, tylenol, robitussin DM as needed for cough 2)  call if no better in few days 3)  Diet! 4)  Please schedule a follow-up appointment in 3 months .    Orders Added: 1)  Venipuncture [36415] 2)  TLB-A1C / Hgb A1C (Glycohemoglobin) [83036-A1C] 3)  Specimen Handling [99000] 4)  Est. Patient Level III [09811]

## 2010-02-10 ENCOUNTER — Encounter: Payer: Self-pay | Admitting: Internal Medicine

## 2010-03-28 LAB — GLUCOSE, CAPILLARY
Glucose-Capillary: 119 mg/dL — ABNORMAL HIGH (ref 70–99)
Glucose-Capillary: 138 mg/dL — ABNORMAL HIGH (ref 70–99)

## 2010-04-26 ENCOUNTER — Other Ambulatory Visit: Payer: Self-pay | Admitting: Internal Medicine

## 2010-05-01 ENCOUNTER — Encounter: Payer: Self-pay | Admitting: Internal Medicine

## 2010-05-01 ENCOUNTER — Ambulatory Visit (INDEPENDENT_AMBULATORY_CARE_PROVIDER_SITE_OTHER): Payer: Medicare Other | Admitting: Internal Medicine

## 2010-05-01 VITALS — BP 140/82 | HR 68 | Wt 203.2 lb

## 2010-05-01 DIAGNOSIS — I10 Essential (primary) hypertension: Secondary | ICD-10-CM

## 2010-05-01 DIAGNOSIS — E119 Type 2 diabetes mellitus without complications: Secondary | ICD-10-CM

## 2010-05-01 LAB — BASIC METABOLIC PANEL
BUN: 12 mg/dL (ref 6–23)
CO2: 28 mEq/L (ref 19–32)
Calcium: 9.6 mg/dL (ref 8.4–10.5)
Chloride: 103 mEq/L (ref 96–112)
Creatinine, Ser: 0.7 mg/dL (ref 0.4–1.2)
GFR: 84.99 mL/min (ref >60.00)
Glucose, Bld: 99 mg/dL (ref 70–99)
Potassium: 4.9 mEq/L (ref 3.5–5.1)
Sodium: 139 mEq/L (ref 135–145)

## 2010-05-01 LAB — HEMOGLOBIN A1C: Hgb A1c MFr Bld: 7.3 % — ABNORMAL HIGH (ref 4.6–6.5)

## 2010-05-01 NOTE — Progress Notes (Signed)
  Subjective:    Patient ID: [name], female    DOB: Oct 29, 1945, 65 y.o.   MRN: [id number]  HPI ROV DM-- AM CBGs ~ 150 C/o feet numbness at night sometimes > day time   Past Medical History  Diagnosis Date  . Diabetes mellitus   . Depression     sees Dr.Cotle  . Hyperlipemia   . Hypertension   . IBS (irritable bowel syndrome)     and dyspepsia  . RLS (restless legs syndrome)   . Colonic polyp   . Osteopenia     dexa 06/2007 and 10/11 Rx CA vitamin d  . Chest pain     x 2 Sept 2011: stress test neg    Review of Systems  Constitutional:       No low sugar sx   Cardiovascular: Negative for chest pain, palpitations and leg swelling.       BP at home rarely checked but usually ok  Psychiatric/Behavioral:       Sx well controlled, back on Abilify       Objective:   Physical Exam  Constitutional: She appears well-developed and well-nourished.  Cardiovascular: Normal rate, regular rhythm and normal heart sounds.   No murmur heard. Pulmonary/Chest: Effort normal and breath sounds normal. No respiratory distress. She has no wheezes. She has no rales.  Musculoskeletal: She exhibits no edema.       Diabetic foot exam: No edema normal vascular Skin dry, no lesion except for callous. Pinprick exam neg          Assessment & Plan:

## 2010-05-01 NOTE — Assessment & Plan Note (Addendum)
No change, labs Recommend to check her blood pressure routinely and call if is consistently more than 140/85

## 2010-05-01 NOTE — Assessment & Plan Note (Addendum)
Not at goal, has mild peripheral neuropathy, see HPI (sx are different from RLS) Issue discussed w/ pt : Diet! Exercise! Feet care Plan is to add a medication if the A1c is > 8, otherwise she will try to work on her lifestyle

## 2010-05-03 ENCOUNTER — Telehealth: Payer: Self-pay | Admitting: *Deleted

## 2010-05-03 NOTE — Telephone Encounter (Signed)
I spoke w/ pts husband he is aware. Mailed copy to pt as well.

## 2010-05-03 NOTE — Telephone Encounter (Signed)
Message copied by Army Fossa on Thu May 03, 2010  2:53 PM ------      Message from: Willow Ora      Created: Thu May 03, 2010  2:43 PM       Advisepatient,       --potassium is normal.      --A1c is 7.3, slt > than goal; continue with same medicines and work on a healthier diet and exercise

## 2010-05-16 ENCOUNTER — Other Ambulatory Visit: Payer: Self-pay | Admitting: Internal Medicine

## 2010-05-25 NOTE — Assessment & Plan Note (Signed)
[facility]                          [facility] OFFICE NOTE   NAME:[name], [name]                      MRN:          [id number]  DATE:09/30/2005                            DOB:          09/06/1945    CHIEF COMPLAINT:  Here to establish.   HISTORY OF PRESENT ILLNESS:  [name] is a 65 year old white female who  is a Engineer, civil (consulting), who came to the office for the first time to get established.  A  few months ago she had what at that time, she thought was a left ear  infection.  She has decreased hearing.  She went to see ENT who prescribed  some antibiotics.  Shortly after, she developed some left eyelid swelling.  She developed discomfort on the left side of the upper face.  A CT of the  sinuses was done and they were negative.  Her eyes were checked by the eye  doctor and they were okay.  In retrospective, she believed that she had some  welts on the left temple,severalsmall lesions. She does not recall having  any blisters.  She is much better but is still concerned about this episode.  Also she is having some right shoulder issues for a few weeks, mostly pain.   PAST MEDICAL HISTORY:  1. Hypertension.  2. Restless leg syndrome.  3. Depression.  Followup with Dr. Freddrick March who Abilify for depression which      helped a lot.  She wasnot diagnosed with bipolar disorder.  4. She had a colonoscopy that show polyps.  She saw Dr. Severiano Gilbert that.      There was a question about IBA since she was prescribed Levsin.  5. History of palpitations.  She saw cardiology.  Her blood pressure was      switched to atenolol with resolution of her symptoms.  6. No surgeries.   FAMILY HISTORY:  1. Grandmother had an AMI.  2. Aunts and uncles have diabetes.  3. Father had colon cancer.  4. No history of breast cancer but she recalls somebody having ovarian      cancer.  5. Mother who was a smoker, died from lung cancer.   SOCIAL HISTORY:  She quit tobacco in  1977.  She drinks alcohol very seldom.  She is married, has 3 children.   MEDICATIONS:  1. Altace 5 one p.o. q. day.  2. Atenolol 50 one p.o. q. day.  3. Cymbalta 60 one p.o. q. day.  4. Abilify 5 mg one p.o. q day.  5. Levsin p.r.n.  6. Klonopin 0.5 p.r.n.  7. Requip 1 mg p.r.n.   REVIEW OF SYSTEMS:  Ambulatory.  Her blood pressure is in the 130s to 140s  over 70 to 80.  Denies any chest pain, shortness of breath.  Admits to  occasional wheezing.  No nausea, diarrhea or blood in the stools.   ALLERGIES:  PENICILLIN.   PHYSICAL EXAMINATION:  The patient is alert, oriented and in no apparent  distress.  She is 5 feet 6-1/2 inches tall.  She weighs 224 pounds.  Blood pressure  130/90.  Pulse  80.  Respirations 16.  __________ regular rate and rhythm without murmur.  EXTREMITIES:  No edema.  No rectal exam.  Speech, gait and motor are intact.  HEENT:  Ear, nose and throat are normal.  Eyes are normal.   ASSESSMENT/PLAN:  1. I wonder if the episode that she had a few months ago represented      atypical shingles.  At this point I recommend observation only.  2. Hypertension.  This seems to be stable.  3. Depression.  This seems to be stable.  4. She had blood work recently at the gastrointestinal office.  We will      try to get the records from them.  5. The patient will also try to obtain the records from her previous      primary care doctor.  6. I remind her to get a flu shot this season and she will get hers at the      [facility].  7. She is to come back in 3 or 4 months to have a complete physical exam.      In the meantime she would like to see a gynecologist.  Several phone      numbers of local gynecologists were provided to the patient.  8. Come back sooner than her complete physical for any problems.                                   Willow Ora, MD   JP/MedQ  DD:  09/30/2005  DT:  10/02/2005  Job #:  [id number]

## 2010-05-25 NOTE — Op Note (Signed)
[name], RHUE               ACCOUNT NO.:  [id number]   MEDICAL RECORD NO.:  [id number]          PATIENT TYPE:  AMB   LOCATION:  DSC                          FACILITY:  [facility]   PHYSICIAN:  Sandria Bales. Ezzard Standing, M.D.  DATE OF BIRTH:  1945/02/03   DATE OF PROCEDURE:  12/10/2004  DATE OF DISCHARGE:                                 OPERATIVE REPORT   PREOPERATIVE DIAGNOSIS:  Mass, left chest wall, and upper outer breast below  the clavicle.   POSTOPERATIVE DIAGNOSIS:  Mass, left chest wall, and upper outer breast  below the clavicle, about 3 cm mass.   PROCEDURE:  Excision of left chest wall mass.   SURGEON:  Sandria Bales. Ezzard Standing, M.D.   ANESTHESIA:  10 mL of 1% Xylocaine.   COMPLICATIONS:  None.   INDICATIONS FOR PROCEDURE:  [name] is a 65 year old white female who  has a mass in the upper outer quadrant of her left breast below her  clavicle.  This mass feels rubbery and by ultrasound appears to be a lipoma,  but discussed with her because of some change in size and concern, we are  going to remove it.   DESCRIPTION OF PROCEDURE:  With the patient in a supine position, her left  arm is out to her side, her upper chest was prepped with Betadine solution  and sterilely draped.  I used 10 mL of 1% Xylocaine, infiltrated in the skin  around the mass and then excised what looks like about a 3 cm lipoma.  This  was sent to pathology.   Hemostasis was controlled with 3-0 Vicryl sutures.  The skin was closed with  a 5-0 Vicryl suture, painted with tincture of Benzoin and Steri-Strips.  The  patient tolerated the procedure well and was transported to the recovery  room in good condition.  Sponge and needle counts were correct at the end of  the case.      Sandria Bales. Ezzard Standing, M.D.  Electronically Signed     DHN/MEDQ  D:  12/10/2004  T:  12/10/2004  Job:  [id number]   cc:   Vale Haven. Andrey Campanile, M.D.  Fax: [phone]

## 2010-05-25 NOTE — Assessment & Plan Note (Signed)
[facility]                           GASTROENTEROLOGY OFFICE NOTE   NAME:[name], [name]                      MRN:          [id number]  DATE:10/23/2005                            DOB:          05-15-1945    This is a return office visit for right upper quadrant and right flank pain  that is intermittent, mild and easily relieved with p.r.n. use of Levsin.  She has no reflux symptoms at this time and is not taking any medications  for it.  She has no dysphasia, odynophagia, melena, hematochezia or change  in bowel habits.  Medications listed on the chart are being reviewed.   MEDICATION ALLERGIES:  PENICILLIN.   EXAMINATION:  GENERAL:  No acute distress.  VITAL SIGNS:  Weight 226 pounds.  Blood pressure is 132/70, pulse 72 and  regular.  HEENT:  Anicteric sclerae, oropharynx clear.  CHEST:  Clear to auscultation bilaterally.  CARDIAC:  Regular rate and rhythm without murmurs.  ABDOMEN:  Soft, with minimal upper right flank tenderness to deep palpation.  No distention, no rebound, no guarding, no palpable organomegaly, masses or  hernias, normoactive bowel sounds.   ASSESSMENT/PLAN:  1. Right flank and right upper quadrant pain.  Continue Levsin      sublingually p.r.n.  Refill for one year supplied to the patient.  2. Gastroesophageal reflux disease.  Symptoms inactive.  May resume OTC      Ranitidine or OTC Prilosec and anti-reflux measures as needed.  3. Personal history of adenomatous colon polyps.  Recall colonoscopy      planned for March 2011.       Venita Lick. Russella Dar, MD, [facility]      MTS/MedQ  DD:  10/23/2005  DT:  10/24/2005  Job #:  [id number]

## 2010-05-25 NOTE — Procedures (Signed)
NAMEMEILIN, [name]               ACCOUNT NO.:  [id number]   MEDICAL RECORD NO.:  [id number]          PATIENT TYPE:  OUT   LOCATION:  SLEEP CENTER                 FACILITY:  [facility]   PHYSICIAN:  Marcelyn Bruins, M.D. [facility] DATE OF BIRTH:  05-23-1945   DATE OF STUDY:  11/03/2004                              NOCTURNAL POLYSOMNOGRAM   REFERRING PHYSICIAN:  Dr. Marcelyn Bruins.   DATE OF STUDY:  November 02, 2004.   INDICATION FOR STUDY:  Hypersomnia with sleep apnea.   EPWORTH SCORE:  13.   SLEEP ARCHITECTURE:  The patient had total sleep time of 344 minutes with  adequate slow wave sleep but decreased REM. Sleep onset latency was  prolonged at 52 minutes and REM onset was at the upper limits of normal.  Sleep efficiency was decreased to 80%.   RESPIRATORY DATA:  The patient was found to have 32 hypopneas and 5 apneas  for a respiratory disturbance index of 6 events per hour. These occurred  primarily during REM and events were not positional. There was moderate to  severe snoring noted.   OXYGEN DATA:  Lowest O2 saturation during her obstructive events was 83%.   CARDIAC DATA:  Not no clinically significant cardiac arrhythmias.   MOVEMENT/PARASOMNIAS:  The patient was found to have 169 leg jerks with 6  per hour resulting in arousal or awakening.   IMPRESSION/RECOMMENDATIONS:  1.  Very mild obstructive sleep apnea, however most of these occur during      REM and therefore may have a more significant clinical impact in the      actual number of events. Treatment for this degree of sleep apnea may      include weight loss alone if applicable, oral appliance, or possibly      oral upper airway surgery. Clinical      correlation is suggested.  2.  Significant numbers of leg jerks with significant sleep disruption.      Clinical correlation is suggested.           ______________________________  Marcelyn Bruins, M.D. [facility]  Diplomate, American Board of Sleep  Medicine     KC/MEDQ  D:   11/08/2004 17:08:55  T:  11/09/2004 00:51:13  Job:  [id number]

## 2010-06-13 ENCOUNTER — Other Ambulatory Visit: Payer: Self-pay | Admitting: Internal Medicine

## 2010-07-14 ENCOUNTER — Other Ambulatory Visit: Payer: Self-pay | Admitting: Internal Medicine

## 2010-07-26 ENCOUNTER — Other Ambulatory Visit: Payer: Self-pay | Admitting: Internal Medicine

## 2010-08-02 ENCOUNTER — Other Ambulatory Visit: Payer: Self-pay | Admitting: Internal Medicine

## 2010-09-27 ENCOUNTER — Other Ambulatory Visit: Payer: Self-pay | Admitting: Internal Medicine

## 2010-11-26 ENCOUNTER — Ambulatory Visit (INDEPENDENT_AMBULATORY_CARE_PROVIDER_SITE_OTHER): Payer: Medicare Other | Admitting: Internal Medicine

## 2010-11-26 ENCOUNTER — Encounter: Payer: Self-pay | Admitting: Internal Medicine

## 2010-11-26 DIAGNOSIS — Z0000 Encounter for general adult medical examination without abnormal findings: Secondary | ICD-10-CM

## 2010-11-26 DIAGNOSIS — E785 Hyperlipidemia, unspecified: Secondary | ICD-10-CM

## 2010-11-26 DIAGNOSIS — I10 Essential (primary) hypertension: Secondary | ICD-10-CM

## 2010-11-26 DIAGNOSIS — R413 Other amnesia: Secondary | ICD-10-CM

## 2010-11-26 DIAGNOSIS — Z23 Encounter for immunization: Secondary | ICD-10-CM

## 2010-11-26 DIAGNOSIS — M899 Disorder of bone, unspecified: Secondary | ICD-10-CM

## 2010-11-26 DIAGNOSIS — E119 Type 2 diabetes mellitus without complications: Secondary | ICD-10-CM

## 2010-11-26 NOTE — Assessment & Plan Note (Addendum)
Self d/c zocor , no s/e but she self d/c it b/c things she heard about it , likes to see if she can keep cholesterol controlled w/ life style Plan: labs

## 2010-11-26 NOTE — Assessment & Plan Note (Addendum)
Td? approximately 2005 the patient Pneumovax (06/09/2007), next 2014  she had a shingles shot in the past Flu shot today     has not seen gynecology  in a  while----->  Will refer to a a female MD Pap Smear:  normal (01/07/2006) last mammogram 2-42012, normal + FH colon cancer Colonoscopy:  04/18/2009--next 2016 Diet-exercise discussed

## 2010-11-26 NOTE — Assessment & Plan Note (Signed)
ambulatory BPs in the 140s, would like to see it in the 130s, see instructions

## 2010-11-26 NOTE — Assessment & Plan Note (Signed)
Due for labs

## 2010-11-26 NOTE — Progress Notes (Signed)
Subjective:    Patient ID: [name], female    DOB: 06/03/1945, 65 y.o.   MRN: [id number]  HPI Here for Medicare AWV: 1. Risk factors based on Past M, S, F history: reviewed 2. Physical Activities: active, no routine    3. Depression/mood:  Sx well controlled  4. Hearing: No problems noted or reported  5. ADL's:  Independent  6. Fall Risk: no recent falls , counseled  7. home Safety: does feel safe at home  8. Height, weight, &visual acuity: see VS, vision well corrected  9. Counseling: provided 10. Labs ordered based on risk factors: if needed  11. Referral Coordination: if needed 12.  Care Plan, see assessment and plan  13.   Cognitive Assessment: Motor skills and cognition seem appropriate  In addition, today we discussed the following: Diabetes--needs improvement on her diet and exercise more, blood sugar is around 150 before meals Hypertension--good medication compliance, ambulatory BPs checked rarely but usually 140/70 High cholesterol--self discontinue Zocor ~6 w ago because she read about certain adverse effects. Likes to recheck her cholesterol and see where she is at. No GERD symptoms, well-controlled with medication.  Past Medical History  Diagnosis Date  . Diabetes mellitus   . Depression     sees Dr.Cotle  . Hyperlipemia   . Hypertension   . IBS (irritable bowel syndrome)     and dyspepsia  . RLS (restless legs syndrome)   . Colonic polyp   . Osteopenia     dexa 06/2007 and 10/11 Rx CA vitamin d  . Chest pain     x 2 Sept 2011: stress test neg   Past Surgical History  Procedure Date  . Uterine fibroid embolization     90s  . Tonsillectomy    History   Social History  . Marital Status: Married    Spouse Name: N/A    Number of Children: 3  . Years of Education: N/A   Occupational History  . retired     Charity fundraiser   Social History Main Topics  . Smoking status: Former Smoker    Quit date: 11/26/1975  . Smokeless tobacco: Never Used  . Alcohol  Use: Yes     rarely  . Drug Use: No  . Sexually Active: Not on file   Other Topics Concern  . Not on file   Social History Narrative   Husband was ill, doing pretty well now (he is my patient) --- 1 g-child   Family History  Problem Relation Age of Onset  . Lung cancer Mother     smoker  . Ovarian cancer Paternal Aunt     ?  Marland Kitchen Breast cancer Neg Hx   . Diabetes      aunts-uncles   . Colon cancer      F dx in his 42s  . Heart attack      GM in her 60s    Review of Systems No chest pain or shortness or breath No nausea, vomiting, diarrhea or blood in the stools. No dysuria gross hematuria. No vaginal bleeding or discharge Her daughters have noted to her some issues with short-term memory, no major problems, able to balance her finances etc.    Objective:   Physical Exam  Constitutional: She is oriented to person, place, and time. She appears well-developed and well-nourished. No distress.  HENT:  Head: Normocephalic and atraumatic.  Neck: No thyromegaly present.       Normal carotid pulses  Cardiovascular: Normal rate,  regular rhythm and normal heart sounds.   No murmur heard. Pulmonary/Chest: Effort normal and breath sounds normal. No respiratory distress. She has no wheezes. She has no rales.  Abdominal: Soft. Bowel sounds are normal. She exhibits no distension. There is no tenderness. There is no rebound.  Musculoskeletal:       DIABETIC FEET EXAM: No lower extremity edema Normal pedal pulses bilaterally Skin and nails are normal except for few calluses Pinprick examination of the feet normal.   Neurological: She is alert and oriented to person, place, and time.  Skin: Skin is warm and dry. She is not diaphoretic.  Psychiatric: She has a normal mood and affect. Her behavior is normal. Judgment and thought content normal.       Assessment & Plan:

## 2010-11-26 NOTE — Assessment & Plan Note (Signed)
Mild memory difficulty by report  but no obvious deficits; rec observation, also to stay active, physically and  mentaly

## 2010-11-26 NOTE — Assessment & Plan Note (Signed)
bone density test 06/2007 ---> osteopenia, T score was -1.1 DEXA 10- 2011 T score -1.2 Plan: continue with calcium, vitamin D

## 2010-11-26 NOTE — Patient Instructions (Signed)
Check the  blood pressure 2 or 3 times a week, be sure it is less than 135/85. If it is consistently higher, let me know Came back fasting: FLP--- dx hyperlipdemia A1C--dx DM CMP, CBC  --dx HTN

## 2010-12-05 ENCOUNTER — Other Ambulatory Visit: Payer: Self-pay | Admitting: Family Medicine

## 2010-12-05 DIAGNOSIS — E785 Hyperlipidemia, unspecified: Secondary | ICD-10-CM

## 2010-12-05 DIAGNOSIS — I10 Essential (primary) hypertension: Secondary | ICD-10-CM

## 2010-12-05 DIAGNOSIS — E119 Type 2 diabetes mellitus without complications: Secondary | ICD-10-CM

## 2010-12-06 ENCOUNTER — Other Ambulatory Visit (INDEPENDENT_AMBULATORY_CARE_PROVIDER_SITE_OTHER): Payer: Medicare Other

## 2010-12-06 DIAGNOSIS — E785 Hyperlipidemia, unspecified: Secondary | ICD-10-CM

## 2010-12-06 DIAGNOSIS — I10 Essential (primary) hypertension: Secondary | ICD-10-CM

## 2010-12-06 DIAGNOSIS — E119 Type 2 diabetes mellitus without complications: Secondary | ICD-10-CM

## 2010-12-06 LAB — CBC
HCT: 35.5 % — ABNORMAL LOW (ref 36.0–46.0)
Hemoglobin: 11.8 g/dL — ABNORMAL LOW (ref 12.0–15.0)
MCHC: 33.2 g/dL (ref 30.0–36.0)
MCV: 78.2 fl (ref 78.0–100.0)
Platelets: 229 10*3/uL (ref 150.0–400.0)
RBC: 4.54 Mil/uL (ref 3.87–5.11)
RDW: 15.1 % — ABNORMAL HIGH (ref 11.5–14.6)
WBC: 6.4 10*3/uL (ref 4.5–10.5)

## 2010-12-06 LAB — COMPREHENSIVE METABOLIC PANEL
ALT: 68 U/L — ABNORMAL HIGH (ref 0–35)
AST: 64 U/L — ABNORMAL HIGH (ref 0–37)
Albumin: 4 g/dL (ref 3.5–5.2)
Alkaline Phosphatase: 61 U/L (ref 39–117)
BUN: 11 mg/dL (ref 6–23)
CO2: 27 mEq/L (ref 19–32)
Calcium: 9 mg/dL (ref 8.4–10.5)
Chloride: 103 mEq/L (ref 96–112)
Creatinine, Ser: 0.7 mg/dL (ref 0.4–1.2)
GFR: 89.04 mL/min (ref >60.00)
Glucose, Bld: 159 mg/dL — ABNORMAL HIGH (ref 70–99)
Potassium: 4.3 mEq/L (ref 3.5–5.1)
Sodium: 138 mEq/L (ref 135–145)
Total Bilirubin: 0.4 mg/dL (ref 0.3–1.2)
Total Protein: 7.7 g/dL (ref 6.0–8.3)

## 2010-12-06 LAB — LIPID PANEL
Cholesterol: 223 mg/dL — ABNORMAL HIGH (ref 0–200)
HDL: 52.9 mg/dL (ref >39.00)
Total CHOL/HDL Ratio: 4
Triglycerides: 218 mg/dL — ABNORMAL HIGH (ref 0.0–149.0)
VLDL: 43.6 mg/dL — ABNORMAL HIGH (ref 0.0–40.0)

## 2010-12-06 LAB — LDL CHOLESTEROL, DIRECT: Direct LDL: 134.7 mg/dL

## 2010-12-06 LAB — HEMOGLOBIN A1C: Hgb A1c MFr Bld: 8 % — ABNORMAL HIGH (ref 4.6–6.5)

## 2010-12-06 NOTE — Progress Notes (Signed)
12  

## 2010-12-11 ENCOUNTER — Telehealth: Payer: Self-pay

## 2010-12-11 MED ORDER — SITAGLIPTIN PHOSPHATE 100 MG PO TABS: 100.0000 mg | ORAL_TABLET | Freq: Every day | ORAL | Status: DC

## 2010-12-11 NOTE — Telephone Encounter (Signed)
Message copied by Beverely Low on Tue Dec 11, 2010  1:41 PM ------      Message from: Wanda Plump      Created: Tue Dec 11, 2010  8:07 AM       Advise patient:      Diabetes needs better control, in addition  to work on diet and exercise needs to start Januvia 100 mg 1 by mouth daily #30, 6 refills      Also cholesterol needs  treatment however her LFTs are elevated (they have been elevated on and off) . For now work on diet and exercise.      Needs a fasting office visit in 3 months

## 2010-12-11 NOTE — Telephone Encounter (Signed)
Pt aware of results and verbalized understanding. Rx sent to pharmacy 

## 2010-12-17 ENCOUNTER — Other Ambulatory Visit: Payer: Self-pay | Admitting: Internal Medicine

## 2011-01-21 ENCOUNTER — Other Ambulatory Visit: Payer: Self-pay | Admitting: Internal Medicine

## 2011-02-19 ENCOUNTER — Ambulatory Visit (INDEPENDENT_AMBULATORY_CARE_PROVIDER_SITE_OTHER): Payer: Medicare Other | Admitting: Internal Medicine

## 2011-02-19 VITALS — BP 132/84 | HR 79 | Temp 98.1°F | Wt 214.0 lb

## 2011-02-19 DIAGNOSIS — K589 Irritable bowel syndrome without diarrhea: Secondary | ICD-10-CM

## 2011-02-19 LAB — POCT URINALYSIS DIPSTICK
Blood, UA: NEGATIVE
Glucose, UA: NEGATIVE
Ketones, UA: NEGATIVE
Leukocytes, UA: NEGATIVE
Nitrite, UA: NEGATIVE
Protein, UA: NEGATIVE
Spec Grav, UA: 1.01
Urobilinogen, UA: 0.2
pH, UA: 5

## 2011-02-19 MED ORDER — GLUCOSE BLOOD VI STRP: ORAL_STRIP | Status: DC

## 2011-02-19 MED ORDER — HYOSCYAMINE SULFATE 0.125 MG SL SUBL: 0.1250 mg | SUBLINGUAL_TABLET | Freq: Four times a day (QID) | SUBLINGUAL | Status: AC | PRN

## 2011-02-19 NOTE — Assessment & Plan Note (Addendum)
Patient presents with right flank pain, denies any dysuria, hematuria, rash or fever. Symptoms remind her of IBS. I reviewed the chart, I found a note from GI from few years ago that describes the same symptoms, previous w/u was negative. With this in mind, the most likely etiology of her pain is IBS. udip negative  Plan:  levsin as needed. She will come back next week for a routine checkup and if she is not better then we will do further  evaluation. She knows to call if symptoms get worse.

## 2011-02-19 NOTE — Progress Notes (Signed)
  Subjective:    Patient ID: [name], female    DOB: [birth year], 66 y.o.   MRN: [id number]  HPI Acute visit  5 days ago developed a pain in the right flank, it was very intense initially described as an ache. Since then she has been left with a dull, persistent ache in the area. This reminds her to her previous IBS episodes so she started to take levsin SL with some relief. She is also feeling slightly weak and fatigued.   Past Medical History  Diagnosis Date  . Diabetes mellitus   . Depression     sees Dr.Cotle  . Hyperlipemia   . Hypertension   . IBS (irritable bowel syndrome)     and dyspepsia  . RLS (restless legs syndrome)   . Colonic polyp   . Osteopenia     dexa 06/2007 and 10/11 Rx CA vitamin d  . Chest pain     x 2 Sept 2011: stress test neg   Past Surgical History  Procedure Date  . Uterine fibroid embolization     90s  . Tonsillectomy      Review of Systems No fever or chills No nausea, vomiting. No diarrhea but has noted an increased frequency in bowel movements. No blood in the stools. No anterior abdominal pain per se  No rash on the flank or abdomen. No chest pain or shortness of breath Blood sugars are running slightly high than usual for the last 2 or 3 days, he is up to 170.     Objective:   Physical Exam  Constitutional: She is oriented to person, place, and time. She appears well-developed. No distress.  Cardiovascular: Normal rate, regular rhythm and normal heart sounds.   No murmur heard. Pulmonary/Chest: Effort normal and breath sounds normal. No respiratory distress. She has no wheezes. She has no rales.  Abdominal: Soft. Bowel sounds are normal. She exhibits no distension. There is no tenderness. There is no rebound and no guarding.    Genitourinary:       No CVA tenderness   Musculoskeletal: She exhibits no edema.  Neurological: She is alert and oriented to person, place, and time.  Skin: She is not diaphoretic.       No rash at  back or abdomen  Psychiatric: She has a normal mood and affect. Her behavior is normal. Judgment and thought content normal.      Assessment & Plan:

## 2011-02-19 NOTE — Patient Instructions (Signed)
Use hyoscyamine as needed Call if symptoms increase

## 2011-02-20 ENCOUNTER — Encounter: Payer: Self-pay | Admitting: Internal Medicine

## 2011-02-26 ENCOUNTER — Ambulatory Visit: Payer: 59 | Admitting: Internal Medicine

## 2011-02-27 ENCOUNTER — Encounter: Payer: Self-pay | Admitting: Internal Medicine

## 2011-02-27 ENCOUNTER — Ambulatory Visit (INDEPENDENT_AMBULATORY_CARE_PROVIDER_SITE_OTHER): Payer: Medicare Other | Admitting: Internal Medicine

## 2011-02-27 ENCOUNTER — Ambulatory Visit: Payer: 59 | Admitting: Internal Medicine

## 2011-02-27 VITALS — BP 138/86 | HR 78 | Temp 98.1°F | Wt 214.0 lb

## 2011-02-27 DIAGNOSIS — E785 Hyperlipidemia, unspecified: Secondary | ICD-10-CM

## 2011-02-27 DIAGNOSIS — R5381 Other malaise: Secondary | ICD-10-CM

## 2011-02-27 DIAGNOSIS — R5383 Other fatigue: Secondary | ICD-10-CM

## 2011-02-27 DIAGNOSIS — K760 Fatty (change of) liver, not elsewhere classified: Secondary | ICD-10-CM | POA: Insufficient documentation

## 2011-02-27 DIAGNOSIS — F329 Major depressive disorder, single episode, unspecified: Secondary | ICD-10-CM

## 2011-02-27 DIAGNOSIS — K589 Irritable bowel syndrome without diarrhea: Secondary | ICD-10-CM

## 2011-02-27 DIAGNOSIS — R7989 Other specified abnormal findings of blood chemistry: Secondary | ICD-10-CM

## 2011-02-27 DIAGNOSIS — F3289 Other specified depressive episodes: Secondary | ICD-10-CM

## 2011-02-27 DIAGNOSIS — E119 Type 2 diabetes mellitus without complications: Secondary | ICD-10-CM

## 2011-02-27 MED ORDER — ATORVASTATIN CALCIUM 10 MG PO TABS: 10.0000 mg | ORAL_TABLET | Freq: Every day | ORAL | Status: DC

## 2011-02-27 NOTE — Assessment & Plan Note (Addendum)
Not "feeling well" since psych d/c abilify-cymbalta and started vibryd; I wonder if that is also affecting her GI sx. Will re asses on RTC check a TSH Plans to see psych this week

## 2011-02-27 NOTE — Assessment & Plan Note (Signed)
Started [place] base on last a1c Good tolerance Labs

## 2011-02-27 NOTE — Assessment & Plan Note (Signed)
Per psych, see fatigue

## 2011-02-27 NOTE — Assessment & Plan Note (Signed)
Repeat LFTs, i don't see a previous w/u, may need a hep panel, u/s, etc

## 2011-02-27 NOTE — Assessment & Plan Note (Addendum)
Not well controlled, not enthusiastic about simvastatin, last LFTs slt elevated Discussed other options like lipitor-- she agreed to star a low dose

## 2011-02-27 NOTE — Progress Notes (Signed)
  Subjective:    Patient ID: [name], female    DOB: 06-28-1945, 66 y.o.   MRN: [id number]  HPI Routine office visit IBS, continue with symptoms ---> frequent bowel movements, occasional diarrhea. Levsin does seem to help with abdominal pain. Diabetes, started Januvia, CBGs last week were elevated but this week are better. Good compliance and no apparent side effects. High cholesterol, off medications due to patient preference. Complaints of fatigue for several weeks, simply not feeling well. On further questioning, her psychiatrist discontinued Abilify on Cymbalta and start her on vibryd. See review of systems.  Past Medical History: DIABETES  DEPRESSION -- sees Dr Benjie Karvonen  HYPERLIPIDEMIA  HYPERTENSION  IBS and dyspepsia SYNDROME, RESTLESS LEGS COLONIC POLYPS  Osteopenia DEXA 06-2007, and 10-11,  Rx Ca Vit D CP x 08 September 2009: stres test neg   Past surgical history Tonsillectomy fibroid embolization in the 90s.  Social History: Reviewed history from 05/24/2009 and no changes required. 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 No orthopnea. No dysuria gross hematuria. No blood in the stools.     Objective:   Physical Exam  Alert, oriented x3. No apparent distress. Lungs, clear to auscultation bilaterally Cardiovascular, regular rate and rhythm, no murmur. Lower extremities without edema. Psychiatry, in no distress, does not appear anxious or particularly depressed. She does look slightly tired.      Assessment & Plan:

## 2011-02-27 NOTE — Assessment & Plan Note (Signed)
Still symptomatic, respond well to prn levsin Labs, if no anemia will observe. Sx may be exacerbated by changes in psych meds , see fatigue

## 2011-02-28 LAB — CBC WITH DIFFERENTIAL/PLATELET
Basophils Absolute: 0 10*3/uL (ref 0.0–0.1)
Basophils Relative: 0.8 % (ref 0.0–3.0)
Eosinophils Absolute: 0.1 10*3/uL (ref 0.0–0.7)
Eosinophils Relative: 1.8 % (ref 0.0–5.0)
HCT: 36.2 % (ref 36.0–46.0)
Hemoglobin: 11.8 g/dL — ABNORMAL LOW (ref 12.0–15.0)
Lymphocytes Relative: 63.7 % — ABNORMAL HIGH (ref 12.0–46.0)
Lymphs Abs: 3.6 10*3/uL (ref 0.7–4.0)
MCHC: 32.5 g/dL (ref 30.0–36.0)
MCV: 76.6 fl — ABNORMAL LOW (ref 78.0–100.0)
Monocytes Absolute: 0.3 10*3/uL (ref 0.1–1.0)
Monocytes Relative: 6 % (ref 3.0–12.0)
Neutro Abs: 1.6 10*3/uL (ref 1.4–7.7)
Neutrophils Relative %: 27.7 % — ABNORMAL LOW (ref 43.0–77.0)
Platelets: 210 10*3/uL (ref 150.0–400.0)
RBC: 4.73 Mil/uL (ref 3.87–5.11)
RDW: 16.5 % — ABNORMAL HIGH (ref 11.5–14.6)
WBC: 5.6 10*3/uL (ref 4.5–10.5)

## 2011-02-28 LAB — HEPATIC FUNCTION PANEL
ALT: 72 U/L — ABNORMAL HIGH (ref 0–35)
AST: 63 U/L — ABNORMAL HIGH (ref 0–37)
Albumin: 3.9 g/dL (ref 3.5–5.2)
Alkaline Phosphatase: 62 U/L (ref 39–117)
Bilirubin, Direct: 0.1 mg/dL (ref 0.0–0.3)
Total Bilirubin: 0.4 mg/dL (ref 0.3–1.2)
Total Protein: 7.5 g/dL (ref 6.0–8.3)

## 2011-02-28 LAB — HEMOGLOBIN A1C: Hgb A1c MFr Bld: 8.1 % — ABNORMAL HIGH (ref 4.6–6.5)

## 2011-02-28 LAB — TSH: TSH: 0.41 u[IU]/mL (ref 0.35–5.50)

## 2011-03-05 ENCOUNTER — Other Ambulatory Visit: Payer: Self-pay

## 2011-03-05 MED ORDER — GLIMEPIRIDE 4 MG PO TABS: 4.0000 mg | ORAL_TABLET | Freq: Every day | ORAL | Status: DC

## 2011-03-30 ENCOUNTER — Other Ambulatory Visit: Payer: Self-pay | Admitting: Internal Medicine

## 2011-04-01 NOTE — Telephone Encounter (Signed)
Refill done.  

## 2011-05-03 ENCOUNTER — Ambulatory Visit: Payer: Medicare Other | Admitting: Internal Medicine

## 2011-05-15 ENCOUNTER — Ambulatory Visit: Payer: Medicare Other | Admitting: Internal Medicine

## 2011-05-20 ENCOUNTER — Other Ambulatory Visit: Payer: Self-pay | Admitting: Internal Medicine

## 2011-05-20 NOTE — Telephone Encounter (Signed)
Refill done.  

## 2011-05-29 ENCOUNTER — Ambulatory Visit (INDEPENDENT_AMBULATORY_CARE_PROVIDER_SITE_OTHER): Payer: Medicare Other | Admitting: Internal Medicine

## 2011-05-29 VITALS — BP 118/78 | HR 66 | Temp 97.8°F | Wt 212.0 lb

## 2011-05-29 DIAGNOSIS — F329 Major depressive disorder, single episode, unspecified: Secondary | ICD-10-CM

## 2011-05-29 DIAGNOSIS — E785 Hyperlipidemia, unspecified: Secondary | ICD-10-CM

## 2011-05-29 DIAGNOSIS — E119 Type 2 diabetes mellitus without complications: Secondary | ICD-10-CM

## 2011-05-29 DIAGNOSIS — R413 Other amnesia: Secondary | ICD-10-CM

## 2011-05-29 DIAGNOSIS — R7989 Other specified abnormal findings of blood chemistry: Secondary | ICD-10-CM

## 2011-05-29 DIAGNOSIS — K7689 Other specified diseases of liver: Secondary | ICD-10-CM

## 2011-05-29 DIAGNOSIS — R5381 Other malaise: Secondary | ICD-10-CM

## 2011-05-29 DIAGNOSIS — K589 Irritable bowel syndrome without diarrhea: Secondary | ICD-10-CM

## 2011-05-29 DIAGNOSIS — F3289 Other specified depressive episodes: Secondary | ICD-10-CM

## 2011-05-29 DIAGNOSIS — R5383 Other fatigue: Secondary | ICD-10-CM

## 2011-05-29 DIAGNOSIS — G3184 Mild cognitive impairment, so stated: Secondary | ICD-10-CM | POA: Insufficient documentation

## 2011-05-29 DIAGNOSIS — F0390 Unspecified dementia without behavioral disturbance: Secondary | ICD-10-CM | POA: Insufficient documentation

## 2011-05-29 DIAGNOSIS — K760 Fatty (change of) liver, not elsewhere classified: Secondary | ICD-10-CM

## 2011-05-29 DIAGNOSIS — IMO0001 Reserved for inherently not codable concepts without codable children: Secondary | ICD-10-CM

## 2011-05-29 LAB — HEPATIC FUNCTION PANEL
ALT: 61 U/L — ABNORMAL HIGH (ref 0–35)
AST: 49 U/L — ABNORMAL HIGH (ref 0–37)
Albumin: 3.9 g/dL (ref 3.5–5.2)
Alkaline Phosphatase: 80 U/L (ref 39–117)
Bilirubin, Direct: 0.1 mg/dL (ref 0.0–0.3)
Total Bilirubin: 0.6 mg/dL (ref 0.3–1.2)
Total Protein: 7.7 g/dL (ref 6.0–8.3)

## 2011-05-29 LAB — BASIC METABOLIC PANEL WITH GFR
BUN: 12 mg/dL (ref 6–23)
CO2: 25 meq/L (ref 19–32)
Calcium: 9 mg/dL (ref 8.4–10.5)
Chloride: 102 meq/L (ref 96–112)
Creatinine, Ser: 0.7 mg/dL (ref 0.4–1.2)
GFR: 84.7 mL/min (ref >60.00)
Glucose, Bld: 156 mg/dL — ABNORMAL HIGH (ref 70–99)
Potassium: 4 meq/L (ref 3.5–5.1)
Sodium: 136 meq/L (ref 135–145)

## 2011-05-29 LAB — HEMOGLOBIN A1C: Hgb A1c MFr Bld: 9.3 % — ABNORMAL HIGH (ref 4.6–6.5)

## 2011-05-29 NOTE — Assessment & Plan Note (Signed)
Per psychiatry, they discontinued vibrid, she is back on Abilify and Cymbalta and doing great

## 2011-05-29 NOTE — Assessment & Plan Note (Addendum)
See history of present illness, at some point she was prevented from donating blood because a hepatitis B titer however she had a hepatitis B vaccination. Plan: Serology markers Ultrasound, suspect possible fatty liver Recheck LFTs, noting that since the last time we checked we discontinue metformin and is on Lipitor since 02/2011

## 2011-05-29 NOTE — Progress Notes (Signed)
  Subjective:    Patient ID: [name], female    DOB: [birth year], 66 y.o.   MRN: [id number]  HPI Followup from previous visit. She complained of increased IBS symptoms and fatigue thought to be due to vibryd, she went to see psychiatry, vibryd  was discontinued and she is back on Cymbalta and Abilify. Feels much better. Diabetes, metformin was discontinued and she was started on glimepiride, ambulatory blood sugars around 180. History of increased LFTs, today she recall that at some point a few years ago she was prevented from donate blood due to  a hepatitis B titer, however she recalls having been vaccinated against hepatitis B. Memory problems? The patient's daughter thinks that she is having some memory issues, she simply doesn't recall certain conversations but no major problems.   Past Medical History: DIABETES   DEPRESSION -- sees Dr Benjie Karvonen   HYPERLIPIDEMIA   HYPERTENSION   IBS and dyspepsia SYNDROME, RESTLESS LEGS COLONIC POLYPS   Osteopenia DEXA 06-2007, and 10-11,  Rx Ca Vit D CP x 08 September 2009: stres test neg   Past surgical history Tonsillectomy fibroid embolization in the 90s.  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     Objective:   Physical Exam  General -- alert, well-developed, and overweight appearing. No apparent distress.  Neurologic-- MMSE score  30 Psych-- Cognition and judgment appear intact. Alert and cooperative with normal attention span and concentration.  not anxious appearing and not depressed appearing.        Assessment & Plan:

## 2011-05-29 NOTE — Assessment & Plan Note (Signed)
Improve after vibrid was discontinued

## 2011-05-29 NOTE — Assessment & Plan Note (Addendum)
Needs better control, recently we had to hold metformin due to increased LFTs and start glimepiride. If not goal, will consider increase glimepiride Labs.

## 2011-05-29 NOTE — Assessment & Plan Note (Signed)
Started Lipitor at the time of her last office visit, no apparent side effects, we'll check LFTs

## 2011-05-29 NOTE — Assessment & Plan Note (Signed)
Patient's daughter thinks she has some memory issues, MMSE is 30. No objective evidence of memory problems

## 2011-05-29 NOTE — Assessment & Plan Note (Signed)
Resolved after vibrid  was discontinue

## 2011-05-30 ENCOUNTER — Encounter: Payer: Self-pay | Admitting: Internal Medicine

## 2011-05-30 LAB — HEPATITIS B CORE ANTIBODY, TOTAL: Hep B Core Total Ab: POSITIVE — AB

## 2011-05-30 LAB — HEPATITIS B SURFACE ANTIGEN: Hepatitis B Surface Ag: NEGATIVE

## 2011-05-30 LAB — HEPATITIS B SURFACE ANTIBODY,QUALITATIVE: Hep B S Ab: POSITIVE — AB

## 2011-05-30 LAB — HEPATITIS C ANTIBODY: HCV Ab: NEGATIVE

## 2011-06-04 NOTE — Progress Notes (Signed)
Addended by: Edwena Felty T on: 06/04/2011 04:55 PM   Modules accepted: Orders

## 2011-06-05 ENCOUNTER — Encounter: Payer: Self-pay | Admitting: Gastroenterology

## 2011-06-07 ENCOUNTER — Ambulatory Visit
Admission: RE | Admit: 2011-06-07 | Discharge: 2011-06-07 | Disposition: A | Discharge status: Home / Self Care | Payer: Medicare Other | Source: Ambulatory Visit | Attending: Internal Medicine | Admitting: Internal Medicine

## 2011-06-07 DIAGNOSIS — K760 Fatty (change of) liver, not elsewhere classified: Secondary | ICD-10-CM

## 2011-06-08 ENCOUNTER — Other Ambulatory Visit: Payer: Self-pay | Admitting: Internal Medicine

## 2011-06-10 NOTE — Telephone Encounter (Signed)
Refill done.  

## 2011-06-11 ENCOUNTER — Other Ambulatory Visit: Payer: Self-pay | Admitting: Internal Medicine

## 2011-06-11 ENCOUNTER — Telehealth: Payer: Self-pay | Admitting: *Deleted

## 2011-06-11 NOTE — Telephone Encounter (Signed)
Pt would like for you to call her back to discuss her ultrasound.

## 2011-06-12 NOTE — Telephone Encounter (Signed)
Discuss with patient ultrasound findings, they are benign, she does need a followup ultrasound to check her kidney lesion

## 2011-06-13 NOTE — Telephone Encounter (Signed)
Refill done.  

## 2011-07-01 ENCOUNTER — Other Ambulatory Visit: Payer: Self-pay | Admitting: *Deleted

## 2011-07-01 MED ORDER — GLIMEPIRIDE 4 MG PO TABS: ORAL_TABLET | ORAL | Status: DC

## 2011-07-02 ENCOUNTER — Ambulatory Visit (INDEPENDENT_AMBULATORY_CARE_PROVIDER_SITE_OTHER): Payer: Medicare Other | Admitting: Gastroenterology

## 2011-07-02 ENCOUNTER — Other Ambulatory Visit (INDEPENDENT_AMBULATORY_CARE_PROVIDER_SITE_OTHER): Payer: Medicare Other

## 2011-07-02 ENCOUNTER — Other Ambulatory Visit: Payer: Self-pay

## 2011-07-02 ENCOUNTER — Encounter: Payer: Self-pay | Admitting: Gastroenterology

## 2011-07-02 VITALS — BP 110/68 | HR 64 | Ht 66.5 in | Wt 214.4 lb

## 2011-07-02 DIAGNOSIS — R7402 Elevation of levels of lactic acid dehydrogenase (LDH): Secondary | ICD-10-CM

## 2011-07-02 DIAGNOSIS — R7401 Elevation of levels of liver transaminase levels: Secondary | ICD-10-CM

## 2011-07-02 DIAGNOSIS — R109 Unspecified abdominal pain: Secondary | ICD-10-CM

## 2011-07-02 DIAGNOSIS — E611 Iron deficiency: Secondary | ICD-10-CM

## 2011-07-02 LAB — IBC PANEL
Iron: 47 ug/dL (ref 42–145)
Saturation Ratios: 10.3 % — ABNORMAL LOW (ref 20.0–50.0)
Transferrin: 327 mg/dL (ref 212.0–360.0)

## 2011-07-02 LAB — PROTIME-INR
INR: 1 ratio (ref 0.8–1.0)
Prothrombin Time: 11.5 s (ref 10.2–12.4)

## 2011-07-02 LAB — FERRITIN: Ferritin: 13.7 ng/mL (ref 10.0–291.0)

## 2011-07-02 MED ORDER — FERROUS SULFATE 325 (65 FE) MG PO TABS: ORAL_TABLET | ORAL | Status: DC

## 2011-07-02 NOTE — Progress Notes (Signed)
History of Present Illness: This is a 66 year old female who has had mildly elevated transaminases generally in the 2 times normal range since 2010. Hepatitis C serology negative hepatitis B serology revealed a positive core antibody and positive surface antibody. Abdominal ultrasound imaging findings as below. She has diabetes mellitus, hyperlipidemia and obesity. She relates no prior history of liver disease or jaundice. She has occasional right flank pain described as mild, intermittent and unrelated to meals, bowel function or movement. The symptoms have been present for many years and have not changed. Denies weight loss, constipation, diarrhea, change in stool caliber, melena, hematochezia, nausea, vomiting, dysphagia, reflux symptoms, chest pain.  1. Diffusely echogenic hepatic parenchyma is suggestive of hepatic  steatosis.  2. 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 This  could be better characterized with contrast enhanced MRI if  clinically indicated.  Current Medications, Allergies, Past Medical History, Past Surgical History, Family History and Social History were reviewed in [employer] record.  Physical Exam: General: Well developed , well nourished, no acute distress Head: Normocephalic and atraumatic Eyes:  sclerae anicteric, EOMI Ears: Normal auditory acuity Mouth: No deformity or lesions Lungs: Clear throughout to auscultation Heart: Regular rate and rhythm; no murmurs, rubs or bruits Abdomen: Soft, non tender and non distended. No masses, hepatosplenomegaly or hernias noted. Normal Bowel sounds Musculoskeletal: Symmetrical with no gross deformities  Pulses:  Normal pulses noted Extremities: No clubbing, cyanosis, edema or deformities noted Neurological: Alert oriented x 4, grossly nonfocal Psychological:  Alert and cooperative. Normal mood and affect  Assessment  and Recommendations:  1. Mildly elevated transaminases very likely secondary to fatty liver. Unlikely related to medications however she has been off Lipitor for about 4-6 weeks and we will recheck her liver function tests today. Prior exposure to hepatitis B. A long-term weight loss program on a low-fat diet optimal control of lipids and diabetes mellitus. Will obtain serologies to evaluate for other causes of liver disease.  2. Right flank pain. Nonspecific mild and intermittent. This does not appear to have a gastrointestinal etiology.  3. Personal history of adenomatous colon polyps. Surveillance colonoscopy April 2016.

## 2011-07-02 NOTE — Patient Instructions (Addendum)
Your physician has requested that you go to the basement for the following lab work before leaving today: cc: Willow Ora, MD

## 2011-07-03 ENCOUNTER — Other Ambulatory Visit (INDEPENDENT_AMBULATORY_CARE_PROVIDER_SITE_OTHER): Payer: Medicare Other

## 2011-07-03 ENCOUNTER — Telehealth: Payer: Self-pay | Admitting: Gastroenterology

## 2011-07-03 DIAGNOSIS — D509 Iron deficiency anemia, unspecified: Secondary | ICD-10-CM

## 2011-07-03 DIAGNOSIS — E611 Iron deficiency: Secondary | ICD-10-CM

## 2011-07-03 LAB — CBC WITH DIFFERENTIAL/PLATELET
Basophils Absolute: 0 10*3/uL (ref 0.0–0.1)
Basophils Relative: 0.6 % (ref 0.0–3.0)
Eosinophils Absolute: 0.2 10*3/uL (ref 0.0–0.7)
Eosinophils Relative: 2.5 % (ref 0.0–5.0)
HCT: 35.9 % — ABNORMAL LOW (ref 36.0–46.0)
Hemoglobin: 11.7 g/dL — ABNORMAL LOW (ref 12.0–15.0)
Lymphocytes Relative: 31.8 % (ref 12.0–46.0)
Lymphs Abs: 2.8 10*3/uL (ref 0.7–4.0)
MCHC: 32.5 g/dL (ref 30.0–36.0)
MCV: 73.6 fl — ABNORMAL LOW (ref 78.0–100.0)
Monocytes Absolute: 0.5 10*3/uL (ref 0.1–1.0)
Monocytes Relative: 6 % (ref 3.0–12.0)
Neutro Abs: 5.2 10*3/uL (ref 1.4–7.7)
Neutrophils Relative %: 59.1 % (ref 43.0–77.0)
Platelets: 243 10*3/uL (ref 150.0–400.0)
RBC: 4.88 Mil/uL (ref 3.87–5.11)
RDW: 16 % — ABNORMAL HIGH (ref 11.5–14.6)
WBC: 8.9 10*3/uL (ref 4.5–10.5)

## 2011-07-03 LAB — ALPHA-1-ANTITRYPSIN: A-1 Antitrypsin, Ser: 118 mg/dL (ref 90–200)

## 2011-07-03 LAB — ANA: Anti Nuclear Antibody(ANA): NEGATIVE

## 2011-07-03 LAB — CERULOPLASMIN: Ceruloplasmin: 37 mg/dL (ref 20–60)

## 2011-07-03 NOTE — Telephone Encounter (Signed)
Reviewed lab results.  She asked that I mail her a copy.

## 2011-07-04 LAB — HM MAMMOGRAPHY: HM Mammogram: NORMAL

## 2011-07-04 LAB — MITOCHONDRIAL ANTIBODIES: Mitochondrial M2 Ab, IgG: 0.49 (ref <0.91)

## 2011-07-04 LAB — ANTI-SMOOTH MUSCLE ANTIBODY, IGG: Smooth Muscle Ab: 6 U (ref <20)

## 2011-07-09 ENCOUNTER — Ambulatory Visit (INDEPENDENT_AMBULATORY_CARE_PROVIDER_SITE_OTHER): Payer: Medicare Other | Admitting: Internal Medicine

## 2011-07-09 ENCOUNTER — Encounter: Payer: Self-pay | Admitting: *Deleted

## 2011-07-09 VITALS — BP 136/82 | HR 84 | Temp 98.2°F | Wt 214.0 lb

## 2011-07-09 DIAGNOSIS — R7989 Other specified abnormal findings of blood chemistry: Secondary | ICD-10-CM

## 2011-07-09 DIAGNOSIS — J069 Acute upper respiratory infection, unspecified: Secondary | ICD-10-CM | POA: Insufficient documentation

## 2011-07-09 DIAGNOSIS — I10 Essential (primary) hypertension: Secondary | ICD-10-CM

## 2011-07-09 DIAGNOSIS — E785 Hyperlipidemia, unspecified: Secondary | ICD-10-CM

## 2011-07-09 DIAGNOSIS — E119 Type 2 diabetes mellitus without complications: Secondary | ICD-10-CM

## 2011-07-09 MED ORDER — AZITHROMYCIN 250 MG PO TABS: ORAL_TABLET | ORAL | Status: AC

## 2011-07-09 MED ORDER — HYDROCODONE-HOMATROPINE 5-1.5 MG/5ML PO SYRP: 5.0000 mL | ORAL_SOLUTION | Freq: Three times a day (TID) | ORAL | Status: AC | PRN

## 2011-07-09 NOTE — Patient Instructions (Signed)
Rest, fluids , tylenol For cough, take Mucinex DM twice a day as needed  If the cough continue, take a hydrocodone cough syrup. Will make you sleepy. Take the antibiotic as prescribed  (Zithromax) only if you are not improving in the next few days Call if no better in few days Call anytime if the symptoms are severe

## 2011-07-09 NOTE — Assessment & Plan Note (Signed)
Has not taken Lipitor lately, plans to restart today

## 2011-07-09 NOTE — Assessment & Plan Note (Signed)
Status post GI evaluation, they suspect fatty liver. Further blood work was negative except for iron deficiency.

## 2011-07-09 NOTE — Progress Notes (Signed)
  Subjective:    Patient ID: [name], female    DOB: [birth year], 66 y.o.   MRN: [id number]  HPI Acute visit 5 days history of respiratory symptoms: Hoarseness, mild sore throat yesterday, cough with white sputum. Last night cough was severe on off , she couldn't sleep well. Since she is here she would like to go ahead and be checked for her routine medical issues. Diabetes, good medication compliance, ambulatory blood sugars around 140. High cholesterol, was supposed to be on Lipitor but not taking it. She simply overlook that. Plans to go back on it. Hypertension, good medication compliance, ambulatory BPs 130/70  Past Medical History: Diabetes Hyperlipidemia Hypertension Increased LFTs, saw GI 06/2011, likely from fatty liver Depression -- sees Dr Benjie Karvonen   IBS and dyspepsia RLS COLONIC POLYPS   Osteopenia DEXA 06-2007, and 10-11,  Rx Ca Vit D CP x 08 September 2009: stres test neg   Past surgical history Tonsillectomy fibroid embolization in the 90s.  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 No nausea, vomiting, diarrhea. No chest pain or shortness of breath.     Objective:   Physical Exam General -- alert, well-developed, and overweight appearing. No apparent distress.  HEENT -- TMs normal, throat w/o redness, face symmetric and not tender to palpation, nose slt congested, voice slt hoarse Lungs -- normal respiratory effort, no intercostal retractions, no accessory muscle use, and normal breath sounds except for a few rhonchi.   Heart-- normal rate, regular rhythm, no murmur, and no gallop.   neurologic-- alert & oriented X3 and strength normal in all extremities. Psych-- Cognition and judgment appear intact. Alert and cooperative with normal attention  span and concentration.  not anxious appearing and not depressed appearing.       Assessment & Plan:

## 2011-07-09 NOTE — Assessment & Plan Note (Signed)
Symptoms consistent with URI/laryngitis. See instructions

## 2011-07-09 NOTE — Assessment & Plan Note (Addendum)
Not well-controlled per last A1c. continue Januvia and glimepiride 4 mg twice a day. Labs

## 2011-07-09 NOTE — Assessment & Plan Note (Signed)
Well controlled 

## 2011-07-10 ENCOUNTER — Ambulatory Visit: Payer: Medicare Other | Admitting: Internal Medicine

## 2011-07-10 ENCOUNTER — Encounter: Payer: Self-pay | Admitting: Internal Medicine

## 2011-07-10 LAB — HEMOGLOBIN A1C: Hgb A1c MFr Bld: 9.3 % — ABNORMAL HIGH (ref 4.6–6.5)

## 2011-07-15 ENCOUNTER — Other Ambulatory Visit: Payer: Self-pay | Admitting: Internal Medicine

## 2011-07-15 ENCOUNTER — Encounter: Payer: Self-pay | Admitting: Internal Medicine

## 2011-07-16 NOTE — Telephone Encounter (Signed)
Ok to refill? Med looks like it has been discontinued. Please advise,

## 2011-07-17 NOTE — Telephone Encounter (Signed)
Done

## 2011-07-17 NOTE — Telephone Encounter (Signed)
Correct, she is off simvastatin, on Lipitor

## 2011-07-22 ENCOUNTER — Ambulatory Visit: Payer: Medicare Other | Admitting: Internal Medicine

## 2011-08-05 ENCOUNTER — Ambulatory Visit: Payer: Medicare Other | Admitting: Internal Medicine

## 2011-08-08 ENCOUNTER — Encounter: Payer: Self-pay | Admitting: Internal Medicine

## 2011-08-08 ENCOUNTER — Ambulatory Visit (INDEPENDENT_AMBULATORY_CARE_PROVIDER_SITE_OTHER): Payer: Medicare Other | Admitting: Internal Medicine

## 2011-08-08 VITALS — BP 138/88 | HR 73 | Temp 98.1°F | Wt 211.0 lb

## 2011-08-08 DIAGNOSIS — E119 Type 2 diabetes mellitus without complications: Secondary | ICD-10-CM

## 2011-08-08 DIAGNOSIS — E785 Hyperlipidemia, unspecified: Secondary | ICD-10-CM

## 2011-08-08 DIAGNOSIS — R7989 Other specified abnormal findings of blood chemistry: Secondary | ICD-10-CM

## 2011-08-08 MED ORDER — ATORVASTATIN CALCIUM 10 MG PO TABS: 10.0000 mg | ORAL_TABLET | Freq: Every day | ORAL | Status: DC

## 2011-08-08 NOTE — Assessment & Plan Note (Addendum)
Her diabetes has been poorly controlled for the last 2 years, implications discussed: Increase risk of heart attacks, strokes, blindness, premature death. Admits there is "a lot of room for improvement" on her diet and exercise. Encouraged to start taking steps to have a healthy lifestyle. She was here to discuss insulin versus victoza base on her last A1C  however she likes to enroll on a pharmaceutical drug study. She thinks that way she will get  more motivation. The study involves a new insulin. If she feels she will benefit from the study I don't disagree as long as they let her know if she is at goal within few months. If she's not well-controlled, she needs to come back.

## 2011-08-08 NOTE — Assessment & Plan Note (Signed)
Saw GI: "Mildly elevated transaminases very likely secondary to fatty liver. Unlikely related to medications however she has been off Lipitor for about 4-6 weeks and we will recheck her liver function tests today. Prior exposure to hepatitis B. A long-term weight loss program on a low-fat diet optimal control of lipids and diabetes mellitus. Will obtain serologies to evaluate for other causes of liver disease."

## 2011-08-08 NOTE — Patient Instructions (Addendum)
For cough, use Mucinex DM over-the-counter as needed, also use Nasonex 2 sprays in each side of the nose every day until the sample is gone. If the cough continue, let me know. Next office visit, by November 2013 for a complete physical exam, fasting.

## 2011-08-08 NOTE — Progress Notes (Signed)
  Subjective:    Patient ID: [name], female    DOB: March 09, 1945, 66 y.o.   MRN: [id number]  HPI Routine office visit Diabetes, good medication compliance, fasting CBGs around 160. See assessment and plan  Hypertension, good medication compliance, check her BP sometimes ~ 130/80s High cholesterol, compliance with medicine poor Increased LFTs, status post GI is now, see assessment and plan.  Past Medical History: Diabetes Hyperlipidemia Hypertension Increased LFTs, saw GI 06/2011, likely from fatty liver Depression -- sees Dr Benjie Karvonen   IBS and dyspepsia RLS COLONIC POLYPS   Osteopenia DEXA 06-2007, and 10-11,  Rx Ca Vit D CP x 08 September 2009: stres test neg   Past surgical history Tonsillectomy fibroid embolization in the 90s.  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 Had a URI 3 weeks ago, feels much better except for a persistent cough, mostly dry, occasionally brings up clear sputum. Admits to some postnasal dripping. No fever chills.     Objective:   Physical Exam  General -- alert, well-developed, and overweight appearing. No apparent distress.  HEENT -- TMs normal, throat w/o redness, face symmetric and not tender to palpation, nose not congested   Lungs -- normal respiratory effort, no intercostal retractions, no accessory muscle use, and normal breath sounds.   Heart-- normal rate, regular rhythm, no murmur, and no gallop.   Psych-- Cognition and judgment appear intact. Alert and cooperative with normal attention span and concentration.  not anxious appearing and not depressed appearing.       Assessment & Plan:

## 2011-08-08 NOTE — Assessment & Plan Note (Signed)
Encouraged good compliance with Lipitor, prescription sent.

## 2011-08-29 ENCOUNTER — Ambulatory Visit: Payer: Medicare Other | Admitting: Internal Medicine

## 2011-11-19 ENCOUNTER — Other Ambulatory Visit: Payer: Self-pay | Admitting: Internal Medicine

## 2011-11-19 NOTE — Telephone Encounter (Signed)
Refill done.  

## 2011-11-28 ENCOUNTER — Encounter: Payer: Self-pay | Admitting: Internal Medicine

## 2011-11-28 ENCOUNTER — Ambulatory Visit (INDEPENDENT_AMBULATORY_CARE_PROVIDER_SITE_OTHER): Payer: Medicare Other | Admitting: Internal Medicine

## 2011-11-28 VITALS — BP 128/82 | HR 67 | Temp 98.0°F | Ht 67.0 in | Wt 218.0 lb

## 2011-11-28 DIAGNOSIS — E785 Hyperlipidemia, unspecified: Secondary | ICD-10-CM

## 2011-11-28 DIAGNOSIS — I10 Essential (primary) hypertension: Secondary | ICD-10-CM

## 2011-11-28 DIAGNOSIS — Z23 Encounter for immunization: Secondary | ICD-10-CM

## 2011-11-28 DIAGNOSIS — D649 Anemia, unspecified: Secondary | ICD-10-CM

## 2011-11-28 DIAGNOSIS — M25539 Pain in unspecified wrist: Secondary | ICD-10-CM

## 2011-11-28 DIAGNOSIS — Z0000 Encounter for general adult medical examination without abnormal findings: Secondary | ICD-10-CM

## 2011-11-28 DIAGNOSIS — E119 Type 2 diabetes mellitus without complications: Secondary | ICD-10-CM

## 2011-11-28 DIAGNOSIS — R7989 Other specified abnormal findings of blood chemistry: Secondary | ICD-10-CM

## 2011-11-28 DIAGNOSIS — M949 Disorder of cartilage, unspecified: Secondary | ICD-10-CM

## 2011-11-28 DIAGNOSIS — M899 Disorder of bone, unspecified: Secondary | ICD-10-CM

## 2011-11-28 LAB — LIPID PANEL
Cholesterol: 197 mg/dL (ref 0–200)
HDL: 44.3 mg/dL (ref >39.00)
LDL Cholesterol: 123 mg/dL — ABNORMAL HIGH (ref 0–99)
Total CHOL/HDL Ratio: 4
Triglycerides: 150 mg/dL — ABNORMAL HIGH (ref 0.0–149.0)
VLDL: 30 mg/dL (ref 0.0–40.0)

## 2011-11-28 LAB — COMPREHENSIVE METABOLIC PANEL
ALT: 37 U/L — ABNORMAL HIGH (ref 0–35)
AST: 33 U/L (ref 0–37)
Albumin: 4 g/dL (ref 3.5–5.2)
Alkaline Phosphatase: 72 U/L (ref 39–117)
BUN: 10 mg/dL (ref 6–23)
CO2: 26 mEq/L (ref 19–32)
Calcium: 9.1 mg/dL (ref 8.4–10.5)
Chloride: 101 mEq/L (ref 96–112)
Creatinine, Ser: 0.7 mg/dL (ref 0.4–1.2)
GFR: 93.37 mL/min (ref >60.00)
Glucose, Bld: 104 mg/dL — ABNORMAL HIGH (ref 70–99)
Potassium: 3.8 mEq/L (ref 3.5–5.1)
Sodium: 136 mEq/L (ref 135–145)
Total Bilirubin: 0.7 mg/dL (ref 0.3–1.2)
Total Protein: 8.1 g/dL (ref 6.0–8.3)

## 2011-11-28 LAB — IRON: Iron: 66 ug/dL (ref 42–145)

## 2011-11-28 LAB — FERRITIN: Ferritin: 53.2 ng/mL (ref 10.0–291.0)

## 2011-11-28 LAB — HEMOGLOBIN: Hemoglobin: 13.2 g/dL (ref 12.0–15.0)

## 2011-11-28 MED ORDER — ATENOLOL 50 MG PO TABS: 50.0000 mg | ORAL_TABLET | Freq: Every day | ORAL | Status: DC

## 2011-11-28 MED ORDER — RAMIPRIL 5 MG PO CAPS: 5.0000 mg | ORAL_CAPSULE | Freq: Every day | ORAL | Status: DC

## 2011-11-28 NOTE — Progress Notes (Signed)
Subjective:    Patient ID: [name], female    DOB: 1945-12-29, 66 y.o.   MRN: [id number]  HPI Here for Medicare AWV: 1. Risk factors based on Past M, S, F history: reviewed 2. Physical Activities: active, no routine     3. Depression/mood:  Sx well controlled, screening neg   4. Hearing: No problems noted or reported  5. ADL's:  Independent   6. Fall Risk: no recent falls , counseled, see instructions 7. home Safety: does feel safe at home   8. Height, weight, &visual acuity: see VS, vision well corrected   9. Counseling: provided 10. Labs ordered based on risk factors: if needed   11. Referral Coordination: if needed 12.  Care Plan, see assessment and plan   13.   Cognitive Assessment: Motor skills and cognition seem appropriate  In addition, today we discussed the following: Diabetes, continue on a research study, see assessment and plan High cholesterol, good medication compliance, no apparent side effects. Hypertension, good medication compliance, BP is always less than 140/70, often times in the 120s. Recently diagnosed with mild iron deficiency, took iron for a while. See assessment and plan. One-month history of left wrist pain, no injury, wrist looks slightly puffy.  Past Medical History:  Diabetes  Hyperlipidemia  Hypertension  Increased LFTs, saw GI 06/2011, likely from fatty liver  Depression -- sees Dr Benjie Karvonen  IBS and dyspepsia  RLS  COLONIC POLYPS  Osteopenia DEXA 06-2007, and 10-11, Rx Ca Vit D  CP x 08 September 2009: stres test neg    Past Surgical History  Procedure Date  . Uterine fibroid embolization     90s  . Tonsillectomy    History   Social History  . Marital Status: Married    Spouse Name: N/A    Number of Children: 3  . Years of Education: N/A   Occupational History  . retired, was a Psychologist, counselling)     RN   Social History Main Topics  . Smoking status: Former Smoker    Quit date: 11/26/1975  . Smokeless tobacco: Never  Used  . Alcohol Use: Yes     Comment: rarely  . Drug Use: No  . Sexually Active: Not on file   Other Topics Concern  . Not on file   Social History Narrative   1 g-child   Family History  Problem Relation Age of Onset  . Lung cancer Mother     smoker  . Ovarian cancer Paternal Aunt     ?  Marland Kitchen Breast cancer Neg Hx   . Diabetes      aunts-uncles   . Colon cancer Father     F dx in his 60s  . Heart attack      GM in her 60s  . Stroke      aunts-uncles     Review of Systems No chest pain or shortness or breath No nausea, vomiting, diarrhea. No heartburn or abdominal pain. No change in the color of the stools. No dysuria or gross hematuria.     Objective:   Physical Exam General -- alert, well-developed  Neck --no thyromegaly , normal carotid pulse Lungs -- normal respiratory effort, no intercostal retractions, no accessory muscle use, and normal breath sounds.   Heart-- normal rate, regular rhythm, no murmur, and no gallop.   Abdomen--soft, slt tender, no distention, no masses, no HSM, no guarding, and no rigidity.   Extremities-- no pretibial edema bilaterally. Right wrist normal,  left wrist slightly puffy in the dorsum, no deformities, range of motion normal. Neurologic-- alert & oriented X3 and strength normal in all extremities. Psych-- Cognition and judgment appear intact. Alert and cooperative with normal attention span and concentration.  not anxious appearing and not depressed appearing.       Assessment & Plan:

## 2011-11-28 NOTE — Assessment & Plan Note (Signed)
Long history of increased LFTs, status post GI eval, felt to be due to fatty liver. She has a history of hepatitis B exposure. Ultrasound 06/2011 showed fatty liver and a kidney abnormality that needs to be rechecked with another ultrasound 06-2011. Plan: LFTs, weight loss.

## 2011-11-28 NOTE — Assessment & Plan Note (Signed)
Seems well controlled, no change, labs 

## 2011-11-28 NOTE — Assessment & Plan Note (Addendum)
Recently detected to have mild anemia, iron deficiency. Took iron supplements temporarily. GI review of systems negative, she is up to date on  screening colonoscopies Plan: Check iron, ferritin, hemoglobin

## 2011-11-28 NOTE — Assessment & Plan Note (Signed)
See previous entry, continue calcium and vitamin D, DEXA next year at some point

## 2011-11-28 NOTE — Assessment & Plan Note (Signed)
Wrist pain-- refer  to Dr Charlett Blake who knows her already

## 2011-11-28 NOTE — Patient Instructions (Addendum)

## 2011-11-28 NOTE — Assessment & Plan Note (Addendum)
Currently under the care of a research study. On insulin. Diet is about the same, not exercising much.Has gained weight. Plan is to come back here as soon as the researcher study ends which would be around March 2014

## 2011-11-28 NOTE — Assessment & Plan Note (Signed)
Td? approximately 2005 the patient  Pneumovax (06/09/2007), next 2014  she had a shingles shot in the past  Had a Flu shot   saw gynecology 2012, reports a PAP then  last mammogram [id number], normal  + FH colon cancer ----> Colonoscopy: 04/18/2009--next 2016  Diet-exercise discussed

## 2011-11-28 NOTE — Assessment & Plan Note (Signed)
Due for labs

## 2011-12-03 ENCOUNTER — Encounter: Payer: Self-pay | Admitting: *Deleted

## 2012-01-16 ENCOUNTER — Emergency Department (HOSPITAL_BASED_OUTPATIENT_CLINIC_OR_DEPARTMENT_OTHER): Payer: Medicare Other

## 2012-01-16 ENCOUNTER — Telehealth: Payer: Self-pay | Admitting: *Deleted

## 2012-01-16 ENCOUNTER — Encounter (HOSPITAL_BASED_OUTPATIENT_CLINIC_OR_DEPARTMENT_OTHER): Payer: Self-pay

## 2012-01-16 ENCOUNTER — Emergency Department (HOSPITAL_BASED_OUTPATIENT_CLINIC_OR_DEPARTMENT_OTHER)
Admission: EM | Admit: 2012-01-16 | Discharge: 2012-01-16 | Disposition: A | Discharge status: Home / Self Care | Payer: Medicare Other | Attending: Emergency Medicine | Admitting: Emergency Medicine

## 2012-01-16 DIAGNOSIS — R000 Tachycardia, unspecified: Secondary | ICD-10-CM | POA: Insufficient documentation

## 2012-01-16 DIAGNOSIS — Z794 Long term (current) use of insulin: Secondary | ICD-10-CM | POA: Insufficient documentation

## 2012-01-16 DIAGNOSIS — Z8719 Personal history of other diseases of the digestive system: Secondary | ICD-10-CM | POA: Insufficient documentation

## 2012-01-16 DIAGNOSIS — E119 Type 2 diabetes mellitus without complications: Secondary | ICD-10-CM | POA: Insufficient documentation

## 2012-01-16 DIAGNOSIS — Z79899 Other long term (current) drug therapy: Secondary | ICD-10-CM | POA: Insufficient documentation

## 2012-01-16 DIAGNOSIS — Z87891 Personal history of nicotine dependence: Secondary | ICD-10-CM | POA: Insufficient documentation

## 2012-01-16 DIAGNOSIS — E785 Hyperlipidemia, unspecified: Secondary | ICD-10-CM | POA: Insufficient documentation

## 2012-01-16 DIAGNOSIS — F3289 Other specified depressive episodes: Secondary | ICD-10-CM | POA: Insufficient documentation

## 2012-01-16 DIAGNOSIS — Z85038 Personal history of other malignant neoplasm of large intestine: Secondary | ICD-10-CM | POA: Insufficient documentation

## 2012-01-16 DIAGNOSIS — I10 Essential (primary) hypertension: Secondary | ICD-10-CM | POA: Insufficient documentation

## 2012-01-16 DIAGNOSIS — R911 Solitary pulmonary nodule: Secondary | ICD-10-CM | POA: Insufficient documentation

## 2012-01-16 DIAGNOSIS — F329 Major depressive disorder, single episode, unspecified: Secondary | ICD-10-CM | POA: Insufficient documentation

## 2012-01-16 LAB — CBC WITH DIFFERENTIAL/PLATELET
Basophils Absolute: 0 10*3/uL (ref 0.0–0.1)
Basophils Relative: 0 % (ref 0–1)
Eosinophils Absolute: 0.3 10*3/uL (ref 0.0–0.7)
Eosinophils Relative: 3 % (ref 0–5)
HCT: 39.6 % (ref 36.0–46.0)
Hemoglobin: 13.4 g/dL (ref 12.0–15.0)
Lymphocytes Relative: 28 % (ref 12–46)
Lymphs Abs: 2.7 10*3/uL (ref 0.7–4.0)
MCH: 27.3 pg (ref 26.0–34.0)
MCHC: 33.8 g/dL (ref 30.0–36.0)
MCV: 80.7 fL (ref 78.0–100.0)
Monocytes Absolute: 0.7 10*3/uL (ref 0.1–1.0)
Monocytes Relative: 7 % (ref 3–12)
Neutro Abs: 6 10*3/uL (ref 1.7–7.7)
Neutrophils Relative %: 62 % (ref 43–77)
Platelets: 219 10*3/uL (ref 150–400)
RBC: 4.91 MIL/uL (ref 3.87–5.11)
RDW: 13.9 % (ref 11.5–15.5)
WBC: 9.7 10*3/uL (ref 4.0–10.5)

## 2012-01-16 LAB — D-DIMER, QUANTITATIVE: D-Dimer, Quant: 1.92 ug/mL-FEU — ABNORMAL HIGH (ref 0.00–0.48)

## 2012-01-16 LAB — BASIC METABOLIC PANEL
BUN: 12 mg/dL (ref 6–23)
CO2: 24 mEq/L (ref 19–32)
Calcium: 10.2 mg/dL (ref 8.4–10.5)
Chloride: 96 mEq/L (ref 96–112)
Creatinine, Ser: 0.8 mg/dL (ref 0.50–1.10)
GFR calc Af Amer: 87 mL/min — ABNORMAL LOW (ref >90)
GFR calc non Af Amer: 75 mL/min — ABNORMAL LOW (ref >90)
Glucose, Bld: 275 mg/dL — ABNORMAL HIGH (ref 70–99)
Potassium: 4.6 mEq/L (ref 3.5–5.1)
Sodium: 133 mEq/L — ABNORMAL LOW (ref 135–145)

## 2012-01-16 LAB — TROPONIN I: Troponin I: <0.3 ng/mL (ref <0.30)

## 2012-01-16 MED ORDER — ATENOLOL 25 MG PO TABS
50.0000 mg | ORAL_TABLET | Freq: Every day | ORAL | Status: DC
  Administered 2012-01-16: 50 mg via ORAL
  Filled 2012-01-16: qty 2

## 2012-01-16 MED ORDER — IOHEXOL 350 MG/ML SOLN
100.0000 mL | Freq: Once | INTRAVENOUS | Status: AC | PRN
  Administered 2012-01-16: 100 mL via INTRAVENOUS

## 2012-01-16 NOTE — Telephone Encounter (Signed)
Patients daughter came to office for her scheduled appt and made [place] aware that her mother who is also a patient here had been trying to reach [place]. I called the patient, she stated she had been traveling recently and had ran out of her Atenolol and Altace. She felt like she was having heart palpitations and wanted to know what she she do. She stated that once she got home she took the Altace but accidentally dropped the Atenolol on the floor and was not sure if she took another one. She stated that her heart rate was in the 120 range, I advised patient to go to the UC and be evaluated due to the lateness of the hour and there being no available appointments.

## 2012-01-16 NOTE — ED Notes (Signed)
MD at bedside. 

## 2012-01-16 NOTE — ED Provider Notes (Signed)
History     CSN: [id number] Arrival date & time 01/16/12  1645 First MD Initiated Contact with Patient 01/16/12 1701      Chief Complaint  Patient presents with  . Palpitations     HPI Patient presents emergency room with complaints of palpitations for the last 2 days.  The patient had been traveling and there was a delay in her travel plans and she ran out of her blood pressure medications. When she got home this morning she went to take her medications but one of her pills dropped. She was not sure which one it was and could not find it but later in the day she found a metoprolol tablet on the counter. Patient was not sure if that was the pill that she dropped this morning and was just left there from prior to her vacation. The patient has continued to notice that her heart is racing. She denies any chest pain or shortness of breath at this time. She has not had any trouble with leg swelling. She denies history of DVT, PE or a more heart troubles. Past Medical History  Diagnosis Date  . Diabetes mellitus   . Depression     sees Dr.Cotle  . Hyperlipemia   . Hypertension   . IBS (irritable bowel syndrome)     and dyspepsia  . RLS (restless legs syndrome)   . Adenomatous colon polyp 03/1988  . Osteopenia     dexa 06/2007 and 10/11 Rx CA vitamin d  . Chest pain     x 2 Sept 2011: stress test neg  . Diverticulosis     Past Surgical History  Procedure Date  . Uterine fibroid embolization     90s  . Tonsillectomy     Family History  Problem Relation Age of Onset  . Lung cancer Mother     smoker  . Ovarian cancer Paternal Aunt     ?  Marland Kitchen Breast cancer Neg Hx   . Diabetes      aunts-uncles   . Colon cancer Father     F dx in his 46s  . Heart attack      GM in her 60s  . Stroke      aunts-uncles     History  Substance Use Topics  . Smoking status: Former Smoker    Quit date: 11/26/1975  . Smokeless tobacco: Never Used  . Alcohol Use: Yes     Comment: rarely    OB  History    Grav Para Term Preterm Abortions TAB SAB Ect Mult Living                  Review of Systems  All other systems reviewed and are negative.    Allergies  Penicillins  Home Medications   Current Outpatient Rx  Name  Route  Sig  Dispense  Refill  . ARIPIPRAZOLE 5 MG PO TABS   Oral   Take 2.5 mg by mouth daily.         . ATENOLOL 50 MG PO TABS   Oral   Take 1 tablet (50 mg total) by mouth daily.   90 tablet   3   . ATORVASTATIN CALCIUM 10 MG PO TABS   Oral   Take 1 tablet (10 mg total) by mouth daily.   90 tablet   1   . DULOXETINE HCL 60 MG PO CPEP   Oral   Take 60 mg by mouth 2 (two) times daily.          Marland Kitchen  FERROUS SULFATE 325 (65 FE) MG PO TABS      Take 1 tablet by mouth 2 times a day for 2 months   30 tablet   1   . GLIMEPIRIDE 4 MG PO TABS   Oral   Take 4 mg by mouth. TAKE 1 TABLET (4 MG TOTAL) BY MOUTH TWO TIMES A DAY.         Marland Kitchen GLUCOSE BLOOD VI STRP      Check twice a day   100 each   6   . INSULIN ISOPHANE HUMAN 100 UNIT/ML [place] SUSP   Subcutaneous   Inject 54 Units into the skin at bedtime.         Marland Kitchen JANUVIA 100 MG PO TABS      TAKE 1 TABLET (100 MG TOTAL) BY MOUTH DAILY.   30 tablet   5   . OMEPRAZOLE 20 MG PO CPDR      TAKE 1 CAPSULE EVERY DAY   30 capsule   1   . RAMIPRIL 5 MG PO CAPS   Oral   Take 1 capsule (5 mg total) by mouth daily.   90 capsule   3   . ROPINIROLE HCL 1 MG PO TABS   Oral   Take 1 mg by mouth at bedtime.             BP 157/81  Pulse 120  Temp 98.3 F (36.8 C) (Oral)  Resp 26  SpO2 98%  Physical Exam  Nursing note and vitals reviewed. Constitutional: No distress.       Obese  HENT:  Head: Normocephalic and atraumatic.  Right Ear: External ear normal.  Left Ear: External ear normal.  Eyes: Conjunctivae normal are normal. Right eye exhibits no discharge. Left eye exhibits no discharge. No scleral icterus.  Neck: Neck supple. No tracheal deviation present.  Cardiovascular:  Regular rhythm and intact distal pulses.  Tachycardia present.   Pulmonary/Chest: Effort normal and breath sounds normal. No stridor. No respiratory distress. She has no wheezes. She has no rales.  Abdominal: Soft. Bowel sounds are normal. She exhibits no distension. There is no tenderness. There is no rebound and no guarding.  Musculoskeletal: She exhibits no edema and no tenderness.  Neurological: She is alert. She has normal strength. No sensory deficit. Cranial nerve deficit:  no gross defecits noted. She exhibits normal muscle tone. She displays no seizure activity. Coordination normal.  Skin: Skin is warm and dry. No rash noted.  Psychiatric: She has a normal mood and affect.    ED Course  Procedures (including critical care time)  Rate: 118 Rhythm: Sinus tachycardia  QRS Axis: normal  Intervals: normal  ST/T Wave abnormalities: normal  Conduction Disutrbances:none  Narrative Interpretation:   Old EKG Reviewed: none available Labs Reviewed  BASIC METABOLIC PANEL - Abnormal; Notable for the following:    Sodium 133 (*)     Glucose, Bld 275 (*)     GFR calc non Af Amer 75 (*)     GFR calc Af Amer 87 (*)     All other components within normal limits  D-DIMER, QUANTITATIVE - Abnormal; Notable for the following:    D-Dimer, Quant 1.92 (*)     All other components within normal limits  CBC WITH DIFFERENTIAL  TROPONIN I   Dg Chest 2 View  01/16/2012  *RADIOLOGY REPORT*  Clinical Data: Palpitations.  Hypertension and diabetes.  CHEST - 2 VIEW  Comparison: 09/25/2009.  Findings: Mild right basilar atelectasis  is present.  No airspace disease.  No effusion. Monitoring leads are projected over the chest.  The cardiopericardial silhouette appears within normal limits.  IMPRESSION: No active cardiopulmonary disease.   Original Report Authenticated By: Andreas Newport, M.D.    Ct Angio Chest Pe W/cm &/or Wo Cm  01/16/2012  *RADIOLOGY REPORT*  Clinical Data: Pulmonary embolism.  Chest pain.   Palpitations. History of diabetes and hypertension.  CT ANGIOGRAPHY CHEST  Technique:  Multidetector CT imaging of the chest using the standard protocol during bolus administration of intravenous contrast. Multiplanar reconstructed images including MIPs were obtained and reviewed to evaluate the vascular anatomy.  Contrast: OMNIPAQUE IOHEXOL 350 MG/ML SOLN  Comparison: chest radiograph today.  Chest radiograph 09/25/2009.  Findings: Technically adequate study.  No pulmonary embolism.  No acute aortic or branch vessel abnormality.  No pericardial or pleural effusion.  No mediastinal, hilar, or axillary lymphadenopathy.  Thoracic esophagus appears normal.  Central airways patent.  6 mm x 6 mm pulmonary nodule is present in the superior segment of the right lower lobe.  Mild right middle lobe and lingular scarring.  No aggressive osseous lesions are present. Striated pattern of the vertebral bodies, raising the possibility of osteopenia.  Incidental imaging of the upper abdomen demonstrates mild enlargement of the caudate lobe with low attenuation.  Prior CT report from 11/20/2000 noted hepatomegaly with fatty liver. Similar changes were seen on ultrasound 06/07/2011.  IMPRESSION: 1.  No acute abnormality.  No pulmonary embolus. 2.  6 mm x 6 mm right upper lobe pulmonary nodule (image number 39 series 6).If the patient is at high risk for bronchogenic carcinoma, follow-up chest CT at 6-12 months is recommended.  If the patient is at low risk for bronchogenic carcinoma, follow-up chest CT at 12 months is recommended.  This recommendation follows the consensus statement: Guidelines for Management of Small Pulmonary Nodules Detected on CT Scans: A Statement from the Fleischner Society as published in Radiology 2005; 237:395-400. 3.  Low attenuation of the caudate lobe of the liver is compatible with focal fatty infiltration.  This was previously noted on prior ultrasound and CT.   Original Report Authenticated By:  Andreas Newport, M.D.      1. Tachycardia       MDM  Tachycardia Suspect the symptoms related to the absence of the patient's beta blocker medication. She forgot to take it for a few days. She was given a dose of metoprolol here in the emergency department and the tachycardia resolved. She's not having any complaints of chest pain or shortness of breath. She does not have any signs of infection. CT scan does not show any evidence of pulmonary embolism.  Discuss with the patient the pulmonary nodule noted and recommend followup CT scanning and 12 months.       Celene Kras, MD 01/16/12 [phone]

## 2012-01-16 NOTE — ED Notes (Signed)
Pt c/o "heart racing" x days-missed x 2-3 doses of BP med due to out of town-denies pain

## 2012-01-28 ENCOUNTER — Ambulatory Visit (INDEPENDENT_AMBULATORY_CARE_PROVIDER_SITE_OTHER): Payer: Medicare Other | Admitting: Internal Medicine

## 2012-01-28 VITALS — BP 148/88 | HR 93 | Temp 98.0°F | Wt 224.0 lb

## 2012-01-28 DIAGNOSIS — D649 Anemia, unspecified: Secondary | ICD-10-CM

## 2012-01-28 DIAGNOSIS — R5381 Other malaise: Secondary | ICD-10-CM

## 2012-01-28 DIAGNOSIS — R5383 Other fatigue: Secondary | ICD-10-CM

## 2012-01-28 DIAGNOSIS — R911 Solitary pulmonary nodule: Secondary | ICD-10-CM

## 2012-01-28 DIAGNOSIS — I10 Essential (primary) hypertension: Secondary | ICD-10-CM

## 2012-01-28 NOTE — Assessment & Plan Note (Addendum)
Today complaining again about fatigue, worse than before. Admits to feeling sleepy frequently but no actual falling asleep On further questioning, she reports extremely loud snoring, had a test years ago, dx RLS, OSA ? Plan-- referral for further eval, has seen Dr Shelle Iron before

## 2012-01-28 NOTE — Progress Notes (Signed)
  Subjective:    Patient ID: [name], female    DOB: 1945/08/20, 67 y.o.   MRN: [id number]  HPI ER followup The patient went to the ER 01-16-12 due to palpitations in the context of being out of town and ran out of Altace and beta blockers. At the ER, EKG  showed a heart rate of 118. D-dimer was positive at 1.9, creatinine was 0.8, sodium was 133, CBC was normal. Chest x-ray was unremarkable CT angiography showed no pulmonary emboli but she had a 6x6 mm RUL nodularity. Here for followup.   Past Medical History:   Diabetes   Hyperlipidemia   Hypertension   Increased LFTs, saw GI 06/2011, likely from fatty liver   Depression -- sees Dr Benjie Karvonen   IBS and dyspepsia   RLS   COLONIC POLYPS   Osteopenia DEXA 06-2007, and 10-11, Rx Ca Vit D   CP x 08 September 2009: stres test neg   Past Surgical History  Procedure Date  . Uterine fibroid embolization     90s  . Tonsillectomy    History   Social History  . Marital Status: Married    Spouse Name: N/A    Number of Children: 3  . Years of Education: N/A   Occupational History  . retired, was a Psychologist, counselling)     RN   Social History Main Topics  . Smoking status: Former Smoker    Quit date: 11/26/1975  . Smokeless tobacco: Never Used  . Alcohol Use: Yes     Comment: rarely  . Drug Use: No  . Sexually Active: Not on file   Other Topics Concern  . Not on file   Social History Narrative   1 g-child    Review of Systems Since she left the ER, she is feeling well, she is back on her medications. Today he reports that she is feeling quite fatigued, more than usual. On further questioning, she admits to snoring and a recent increasing her weight; thinks wt gain is related to insulin which she is taking for a research study. BP today is a slightly elevated but ambulatory BPs are normal at 130/80 most of the time. Denies any chest pain, shortness of breath. No lower extremity edema or orthopnea. No blood in the  stools, not tarry stools, some constipation. She is concerned about the\a pulmonary nodule.     Objective:   Physical Exam General -- alert, well-developed, and overweight appearing. No apparent distress.  Lungs -- normal respiratory effort, no intercostal retractions, no accessory muscle use, and normal breath sounds.   Heart-- normal rate, regular rhythm, no murmur, and no gallop.   Extremities-- no pretibial edema bilaterally  Neurologic-- alert & oriented X3 and strength normal in all extremities. Psych-- Cognition and judgment appear intact. Alert and cooperative with normal attention span and concentration.  not anxious appearing and not depressed appearing.     Assessment & Plan:

## 2012-01-28 NOTE — Assessment & Plan Note (Signed)
Recently seen at the ER after she ran out of BP meds, she was tachycardic, she is back on medications and feeling well. Good ambulatory BPs Plan: No change

## 2012-01-28 NOTE — Assessment & Plan Note (Signed)
Recent CBC normal. Problem resolved

## 2012-01-28 NOTE — Assessment & Plan Note (Addendum)
Found to have a pulmonary nodule S/p tobacco , 2 ppd , quit in the 57s Mother had lung cancer, she was a smoker, dx at age 67 Plan: CT in 6 months

## 2012-01-30 ENCOUNTER — Encounter: Payer: Self-pay | Admitting: Internal Medicine

## 2012-02-04 ENCOUNTER — Ambulatory Visit (INDEPENDENT_AMBULATORY_CARE_PROVIDER_SITE_OTHER): Payer: Medicare Other | Admitting: Internal Medicine

## 2012-02-04 VITALS — BP 136/78 | HR 83 | Temp 98.1°F | Wt 222.0 lb

## 2012-02-04 DIAGNOSIS — N390 Urinary tract infection, site not specified: Secondary | ICD-10-CM

## 2012-02-04 LAB — POCT URINALYSIS DIPSTICK
Bilirubin, UA: NEGATIVE
Glucose, UA: NEGATIVE
Ketones, UA: NEGATIVE
Nitrite, UA: NEGATIVE
Protein, UA: NEGATIVE
Spec Grav, UA: <=1.005
Urobilinogen, UA: 0.2
pH, UA: 6

## 2012-02-04 MED ORDER — SULFAMETHOXAZOLE-TRIMETHOPRIM 800-160 MG PO TABS: 1.0000 | ORAL_TABLET | Freq: Two times a day (BID) | ORAL | Status: DC

## 2012-02-04 NOTE — Progress Notes (Signed)
  Subjective:    Patient ID: [name], female    DOB: 04-03-1945, 67 y.o.   MRN: [id number]  HPI Acute visit Yesterday he developed dysuria, urinary urgency, saw some blood when she wiped after urination. Mild low back ache. Today she feels better Past Medical History  Diagnosis Date  . Diabetes mellitus   . Depression     sees Dr.Cotle  . Hyperlipemia   . Hypertension   . IBS (irritable bowel syndrome)     and dyspepsia  . RLS (restless legs syndrome)   . Adenomatous colon polyp 03/1988  . Osteopenia     dexa 06/2007 and 10/11 Rx CA vitamin d  . Chest pain      Sept 2011: stress test neg  . Diverticulosis   . Fatty liver     Increased LFTs, saw GI 06/2011, likely from fatty liver    Past Surgical History  Procedure Date  . Uterine fibroid embolization     90s  . Tonsillectomy     Review of Systems No fever chills No nausea, vomiting, diarrhea. No suprapubic discomfort , no vaginal discharge    Objective:   Physical Exam  General -- alert, well-developed, no apparent distress.   Abdomen-- not distended, soft, minimal tenderness at the lower abdomen without mass or rebound. Very mild CVA tenderness symmetrically.  Psych-- Cognition and judgment appear intact. Alert and cooperative with normal attention span and concentration.  not anxious appearing and not depressed appearing.      Assessment & Plan:  UTI, Symptoms consistent with a UTI, she is off it afebrile, GI review of systems negative. Urine dip did show some infection Plan: Urine culture, Bactrim, drink plenty of fluids and come back to the office if not better or if symptoms increase

## 2012-02-04 NOTE — Patient Instructions (Addendum)
Urinary Tract Infection Urinary tract infections (UTIs) can develop anywhere along your urinary tract. Your urinary tract is your body's drainage system for removing wastes and extra water. Your urinary tract includes two kidneys, two ureters, a bladder, and a urethra. Your kidneys are a pair of bean-shaped organs. Each kidney is about the size of your fist. They are located below your ribs, one on each side of your spine. CAUSES Infections are caused by microbes, which are microscopic organisms, including fungi, viruses, and bacteria. These organisms are so small that they can only be seen through a microscope. Bacteria are the microbes that most commonly cause UTIs. SYMPTOMS  Symptoms of UTIs may vary by age and gender of the patient and by the location of the infection. Symptoms in young women typically include a frequent and intense urge to urinate and a painful, burning feeling in the bladder or urethra during urination. Older women and men are more likely to be tired, shaky, and weak and have muscle aches and abdominal pain. A fever may mean the infection is in your kidneys. Other symptoms of a kidney infection include pain in your back or sides below the ribs, nausea, and vomiting. DIAGNOSIS To diagnose a UTI, your caregiver will ask you about your symptoms. Your caregiver also will ask to provide a urine sample. The urine sample will be tested for bacteria and white blood cells. White blood cells are made by your body to help fight infection. TREATMENT  Typically, UTIs can be treated with medication. Because most UTIs are caused by a bacterial infection, they usually can be treated with the use of antibiotics. The choice of antibiotic and length of treatment depend on your symptoms and the type of bacteria causing your infection. HOME CARE INSTRUCTIONS  If you were prescribed antibiotics, take them exactly as your caregiver instructs you. Finish the medication even if you feel better after you  have only taken some of the medication.  Drink enough water and fluids to keep your urine clear or pale yellow.  Avoid caffeine, tea, and carbonated beverages. They tend to irritate your bladder.  Empty your bladder often. Avoid holding urine for long periods of time.  Empty your bladder before and after sexual intercourse.  After a bowel movement, women should cleanse from front to back. Use each tissue only once. SEEK MEDICAL CARE IF:   You have back pain.  You develop a fever.  Your symptoms do not begin to resolve within 3 days. SEEK IMMEDIATE MEDICAL CARE IF:   You have severe back pain or lower abdominal pain.  You develop chills.  You have nausea or vomiting.  You have continued burning or discomfort with urination. MAKE SURE YOU:   Understand these instructions.  Will watch your condition.  Will get help right away if you are not doing well or get worse. Document Released: 10/03/2004 Document Revised: 06/25/2011 Document Reviewed: 02/01/2011 [employer] Patient Information 2013 [employer].  

## 2012-02-05 ENCOUNTER — Encounter: Payer: Self-pay | Admitting: Internal Medicine

## 2012-02-06 LAB — URINE CULTURE: Colony Count: >=100000

## 2012-02-11 ENCOUNTER — Telehealth: Payer: Self-pay | Admitting: *Deleted

## 2012-02-11 NOTE — Telephone Encounter (Signed)
Pt left VM that she was returning a call to check status of UTI. Pt return call stating that the med help and she is no longer having any symptom of UTI.

## 2012-02-11 NOTE — Telephone Encounter (Signed)
Thx, no further eval needed

## 2012-02-13 ENCOUNTER — Ambulatory Visit (INDEPENDENT_AMBULATORY_CARE_PROVIDER_SITE_OTHER): Payer: Medicare Other | Admitting: Pulmonary Disease

## 2012-02-13 ENCOUNTER — Encounter: Payer: Self-pay | Admitting: Pulmonary Disease

## 2012-02-13 ENCOUNTER — Other Ambulatory Visit: Payer: Self-pay | Admitting: Internal Medicine

## 2012-02-13 VITALS — BP 140/90 | HR 88 | Temp 97.4°F | Ht 66.0 in | Wt 227.2 lb

## 2012-02-13 DIAGNOSIS — G2581 Restless legs syndrome: Secondary | ICD-10-CM

## 2012-02-13 DIAGNOSIS — G4733 Obstructive sleep apnea (adult) (pediatric): Secondary | ICD-10-CM

## 2012-02-13 NOTE — Assessment & Plan Note (Signed)
The patient has a history of very mild obstructive sleep apnea in the past, and does not feel that she has had an increase in symptoms since her initial sleep study.  She is concerned about her snoring, and an upcoming trip where she will have to share a room with a friend.  Based on her history, she does not have significant sleepiness issues during the day.  This is unlikely to be a significant cardiovascular risk for her.  Therefore, the decision to treat this more aggressively should be based upon its impact to her quality of life.  I have discussed with her the options of weight loss alone, upper airway surgery, dental appliance, and CPAP.  After a long discussion, she would like to consider a dental appliance, and therefore I will refer her for consultation.  I've also encouraged her to work aggressively on weight loss.

## 2012-02-13 NOTE — Assessment & Plan Note (Signed)
The patient is on a dopamine agonist that she thinks has helped her sleep.  I have asked her to continue on this if this is the case.

## 2012-02-13 NOTE — Progress Notes (Signed)
  Subjective:    Patient ID: [name], female    DOB: 1945-02-05, 67 y.o.   MRN: [id number]  HPI The patient is a 67 year old female who I've been asked to see for management of obstructive sleep apnea.  She was diagnosed with very mild OSA in 2006, with an AHI of 6 events per hour.  Her sleep study also showed large numbers of periodic limb movements with sleep disruption.  She was tried on Requip to see if this would help her sleep, and I apparently had not seen her back since, her old chart is unavailable for my review.  The patient continues to have snoring issues, but no one has ever commented on an abnormal breathing pattern during sleep.  History is difficult because the patient sleeps alone.  She awakens 1-2 times a night, and feels rested only about 50% of the mornings.  She denies any significant daytime sleepiness except with reading.  She has no issues in the evenings watching television or movies.  She has rare sleepiness with driving.  The patient's weight is up about 14 pounds since 2008, and her Epworth score today is mildly abnormal at 11.  Sleep Questionnaire: What time do you typically go to bed?( Between what hours) 9-11p How long does it take you to fall asleep? 10-28mins How many times during the night do you wake up? 1 What time do you get out of bed to start your day? 0700 Do you drive or operate heavy machinery in your occupation? No How much has your weight changed (up or down) over the past two years? (In pounds) 12 lb (5.443 kg) Have you ever had a sleep study before? Yes If yes, location of study? [facility] If yes, date of study? 11/2004 Do you currently use CPAP? No Do you wear oxygen at any time? No    Review of Systems  Constitutional: Negative for fever and unexpected weight change.  HENT: Positive for rhinorrhea and postnasal drip. Negative for ear pain, nosebleeds, congestion, sore throat, sneezing, trouble swallowing, dental problem and sinus pressure.   Eyes:  Negative for redness and itching.  Respiratory: Positive for cough. Negative for chest tightness, shortness of breath and wheezing.   Cardiovascular: Negative for palpitations and leg swelling.  Gastrointestinal: Negative for nausea and vomiting.  Genitourinary: Negative for dysuria.  Musculoskeletal: Negative for joint swelling.  Skin: Negative for rash.  Neurological: Negative for headaches.  Hematological: Does not bruise/bleed easily.  Psychiatric/Behavioral: Positive for dysphoric mood. The patient is not nervous/anxious.        Objective:   Physical Exam Constitutional:  Well developed, no acute distress  HENT:  Nares with septal deviation to left with near obstruction.   Oropharynx without exudate, palate and uvula are mildly elongated.   Eyes:  Perrla, eomi, no scleral icterus  Neck:  No JVD, no TMG  Cardiovascular:  Normal rate, regular rhythm, no rubs or gallops.  No murmurs        Intact distal pulses  Pulmonary :  Normal breath sounds, no stridor or respiratory distress   No rales, rhonchi, or wheezing  Abdominal:  Soft, nondistended, bowel sounds present.  No tenderness noted.   Musculoskeletal:  No lower extremity edema noted.  Lymph Nodes:  No cervical lymphadenopathy noted  Skin:  No cyanosis noted  Neurologic:  Alert, appropriate, moves all 4 extremities without obvious deficit.         Assessment & Plan:

## 2012-02-13 NOTE — Patient Instructions (Addendum)
Will refer you to Dr. Althea Grimmer for possible dental appliance. Work on weight loss Please call if you would like to consider cpap as a possible treatment option.

## 2012-02-13 NOTE — Telephone Encounter (Signed)
Refill done.  

## 2012-02-25 ENCOUNTER — Telehealth: Payer: Self-pay | Admitting: *Deleted

## 2012-02-25 NOTE — Telephone Encounter (Signed)
Called pt to let her know that we did receive the paperwork & Dr. Drue Novel has it. I advised pt I will give her a call as soon as Dr. Drue Novel has a chance to review it.

## 2012-02-25 NOTE — Telephone Encounter (Signed)
Pt left VM that she is currently participating in a drug study and they faxed over some paper work today. Pt indicated that paper work was to discuss which insulin Dr Drue Novel would like for her to be on. Pt would like to know if this info has been received.

## 2012-02-26 NOTE — Telephone Encounter (Signed)
Letter from date research study reviewed: She started a trial August 2013, she was randomized to NPH insulin. Current dose 58 units daily. Initial A1c was 8.0, recent A1c 12/18/2011 was 7.8.  Please reschedule the office visit to discuss diabetes.

## 2012-02-26 NOTE — Telephone Encounter (Signed)
Discussed with pt, schedule appt 2.24.14

## 2012-03-02 ENCOUNTER — Ambulatory Visit (INDEPENDENT_AMBULATORY_CARE_PROVIDER_SITE_OTHER): Payer: Medicare Other | Admitting: Internal Medicine

## 2012-03-02 ENCOUNTER — Encounter: Payer: Self-pay | Admitting: Internal Medicine

## 2012-03-02 VITALS — BP 122/76 | HR 80 | Temp 98.1°F | Wt 223.0 lb

## 2012-03-02 DIAGNOSIS — J020 Streptococcal pharyngitis: Secondary | ICD-10-CM

## 2012-03-02 DIAGNOSIS — E785 Hyperlipidemia, unspecified: Secondary | ICD-10-CM

## 2012-03-02 DIAGNOSIS — E119 Type 2 diabetes mellitus without complications: Secondary | ICD-10-CM

## 2012-03-02 MED ORDER — INSULIN GLARGINE 100 UNIT/ML ~~LOC~~ SOLN: SUBCUTANEOUS | Status: DC

## 2012-03-02 MED ORDER — "PEN NEEDLES 5/16"" 31G X 8 MM MISC": Status: DC

## 2012-03-02 MED ORDER — ATORVASTATIN CALCIUM 20 MG PO TABS: 20.0000 mg | ORAL_TABLET | Freq: Every day | ORAL | Status: DC

## 2012-03-02 NOTE — Progress Notes (Signed)
  Subjective:    Patient ID: [name], female    DOB: 1945-02-28, 67 y.o.   MRN: [id number]  HPI Routine office visit Diabetes, she was taking NPH 58 units every night up to a week ago whenthe research study she was participating ended and she ran out of insulin. Since then, her CBGs have increase, currently around 187, 217 in the mornings. Prior to stopping NPH, her blood sugars were much better although admits that for the last 3 weeks even with NPH, CBGs were not as well-controlled as before. Depression, self discontinued Abilify because she is feeling well. See a/p. High cholesterol, was recommended to increase Lipitor to 20 mg daily, still taking 10 mg daily. Also complains of sore throat for one day. See review of systems  Past Medical History  Diagnosis Date  . Diabetes mellitus   . Depression     sees Dr.Cotle  . Hyperlipemia   . Hypertension   . IBS (irritable bowel syndrome)     and dyspepsia  . RLS (restless legs syndrome)   . Adenomatous colon polyp 03/1988  . Osteopenia     dexa 06/2007 and 10/11 Rx CA vitamin d  . Chest pain      Sept 2011: stress test neg  . Diverticulosis   . Fatty liver     Increased LFTs, saw GI 06/2011, likely from fatty liver    Past Surgical History  Procedure Laterality Date  . Uterine fibroid embolization      90s  . Tonsillectomy      Review of Systems No fever chills, no runny nose, mild sinus congestion. Denies chest pain or shortness or breath.  No dysuria or gross hematuria. Diet has improved, definitely exercising better.      Objective:   Physical Exam General -- alert, well-developed Neck --  no LADs HEENT -- TMs normal, throat w/o redness, face symmetric and not tender to palpation,nose not congested Lungs -- normal respiratory effort, no intercostal retractions, no accessory muscle use, and normal breath sounds.   Heart-- normal rate, regular rhythm, no murmur, and no gallop.   Extremities-- no pretibial edema  bilaterally Neurologic-- alert & oriented X3 and strength normal in all extremities. Psych-- Cognition and judgment appear intact. Alert and cooperative with normal attention span and concentration.  not anxious appearing and not depressed appearing.      Assessment & Plan:   Sore throat, conservative treatment, see instructions

## 2012-03-02 NOTE — Patient Instructions (Addendum)
Start Lantus 40 units once every night. Watch for low blood sugar symptoms. Call with morning blood sugars in 10 days, goal is to have your blood sugars between 90 and 110. ----- Rest, fluids , tylenol if cough, take Mucinex DM twice a day as needed   Call if no better in few days Call anytime if the symptoms are severe, you have high fever, short of breath, chest pain --- Next visit in 6 weeks ,fasting

## 2012-03-02 NOTE — Assessment & Plan Note (Addendum)
Based on her last cholesterol panel, were increased Lipitor from 10-20 mg, still taking 10. Plan: Start Lipitor 20

## 2012-03-02 NOTE — Assessment & Plan Note (Signed)
Self discontinued Abilify because she is doing great. Denies previous diagnosis of bipolar Plan:  We'll see Mrs [name], [place] at Dr. Runell Gess office to see how she's doing and if she needs to go back to Abilify.Also planning to see if she could go back to see Dr. Jennelle Human. At some point she would like me to take over her depression care.

## 2012-03-02 NOTE — Assessment & Plan Note (Addendum)
Was on a research study, on NPH 58 units nightly up until a week ago when she ran out. See assessment and plan. Last A1c 7.8, 2 months ago. Plan: Start Lantus, 40 units each bedtime, watch for low sugar symptoms. See instructions.  Continue with her healthier diet and exercise. CBGs slt higher even before she ran out of insulin, udip (-), no UTI

## 2012-03-16 ENCOUNTER — Telehealth: Payer: Self-pay

## 2012-03-16 NOTE — Telephone Encounter (Signed)
Pt called to inform PCP of her CBG readings (prior to breakfast); 03/03 132  03/04 136  03/05 195 (had a last supper) 03/06 163  03/07 171 03/08 168  03/10 203 (did not take insulin night prior)

## 2012-03-16 NOTE — Telephone Encounter (Signed)
Confirm she is on lantus 40 u qhs. Increase to lantus 50 u qhs Watch for low sugars, call in 2 weeks w/ readings

## 2012-03-16 NOTE — Telephone Encounter (Signed)
Left message to call office

## 2012-03-17 NOTE — Telephone Encounter (Signed)
Discuss with patient daughter will email Pt and give [place] a call back with correct dosing of lantus.

## 2012-03-17 NOTE — Telephone Encounter (Signed)
Msg left on [facility] triage returning call back on her mom. She is out of the country and she is on her file to received msg...[name]

## 2012-03-25 ENCOUNTER — Encounter: Payer: Self-pay | Admitting: Internal Medicine

## 2012-04-03 ENCOUNTER — Ambulatory Visit: Payer: Medicare Other | Admitting: Internal Medicine

## 2012-04-13 ENCOUNTER — Encounter (INDEPENDENT_AMBULATORY_CARE_PROVIDER_SITE_OTHER): Payer: Medicare Other

## 2012-04-13 ENCOUNTER — Encounter: Payer: Self-pay | Admitting: Internal Medicine

## 2012-04-13 ENCOUNTER — Ambulatory Visit (INDEPENDENT_AMBULATORY_CARE_PROVIDER_SITE_OTHER): Payer: Medicare Other | Admitting: Internal Medicine

## 2012-04-13 ENCOUNTER — Telehealth: Payer: Self-pay | Admitting: *Deleted

## 2012-04-13 VITALS — BP 122/74 | HR 67 | Temp 98.3°F | Wt 220.0 lb

## 2012-04-13 DIAGNOSIS — R5383 Other fatigue: Secondary | ICD-10-CM

## 2012-04-13 DIAGNOSIS — I10 Essential (primary) hypertension: Secondary | ICD-10-CM

## 2012-04-13 DIAGNOSIS — M7989 Other specified soft tissue disorders: Secondary | ICD-10-CM

## 2012-04-13 DIAGNOSIS — E119 Type 2 diabetes mellitus without complications: Secondary | ICD-10-CM

## 2012-04-13 DIAGNOSIS — R5381 Other malaise: Secondary | ICD-10-CM

## 2012-04-13 MED ORDER — INSULIN DETEMIR 100 UNIT/ML ~~LOC~~ SOLN: 15.0000 [IU] | Freq: Every day | SUBCUTANEOUS | Status: DC

## 2012-04-13 MED ORDER — INSULIN DETEMIR 100 UNIT/ML ~~LOC~~ SOLN: 50.0000 [IU] | Freq: Every day | SUBCUTANEOUS | Status: DC

## 2012-04-13 NOTE — Telephone Encounter (Signed)
Discussed with pt

## 2012-04-13 NOTE — Assessment & Plan Note (Signed)
On Lantus 50 units daily, Amaryl and Januvia. CBGs in the morning range from 90-123, denies symptoms consistent with low sugars although she complains of fatigue in general today. Cost of Lantus is an issue, will switch to Levemir 50 units. Check the A1c Reassess in 6 weeks

## 2012-04-13 NOTE — Patient Instructions (Addendum)
Please get the ultrasound of your leg to rule out a clot. ---- Come back fasting this week:  FLP, AST, ALT --- dx high cholesterol Hemoglobin A1c --- dx diabetes --- Switch Lantus to Levemir, call with your blood sugar readings in 4 weeks

## 2012-04-13 NOTE — Assessment & Plan Note (Signed)
Normal BP today!

## 2012-04-13 NOTE — Progress Notes (Signed)
  Subjective:    Patient ID: [name], female    DOB: 06-01-1945, 67 y.o.   MRN: [id number]  HPI Routine office visit Diabetes, good compliance with Lantus 50 units, cost is an issue, would like to try levemir, see assessment and plan. High cholesterol, started Lipitor, no apparent side effects. Fatigue, she went to [place] last month, came back around 3/21/ 2014, while she was there she got sick: Cough, sore throat, postnasal dripping and feeling tired. She continue to feeling extremely tired, more so since the URI type of sx ,  had to take 2 naps yesterday.  Past Medical History  Diagnosis Date  . Diabetes mellitus   . Depression     sees Dr.Cotle  . Hyperlipemia   . Hypertension   . IBS (irritable bowel syndrome)     and dyspepsia  . RLS (restless legs syndrome)   . Adenomatous colon polyp 03/1988  . Osteopenia     dexa 06/2007 and 10/11 Rx CA vitamin d  . Chest pain      Sept 2011: stress test neg  . Diverticulosis   . Fatty liver     Increased LFTs, saw GI 06/2011, likely from fatty liver    Past Surgical History  Procedure Laterality Date  . Uterine fibroid embolization      90s  . Tonsillectomy      Review of Systems Currently without fever or chills. Mild cough, no shortness or breath , no dyspnea on exertion. No chest pain. Denies any pain or swelling in her calves although left calf today was slightly swollen, she doesn't know when it becomes swollen    Objective:   Physical Exam BP 122/74  Pulse 67  Temp(Src) 98.3 F (36.8 C) (Oral)  Wt 220 lb (99.791 kg)  BMI 35.53 kg/m2  SpO2 98%  General -- alert, well-developed, no apparent distress    HEENT -- not pale or jaundice Tympanic membranes normal, nose not congested, throat normal. Lungs -- normal respiratory effort, no intercostal retractions, no accessory muscle use, and normal breath sounds.   Heart-- normal rate, regular rhythm, no murmur, and no gallop.  \ Extremities-- Left calf is 1.5 cm  larger than the right, not tender, warm or red. Neurologic-- alert & oriented X3 and strength normal in all extremities. Psych-- Cognition and judgment appear intact. Alert and cooperative with normal attention span and concentration.  not anxious appearing and not depressed appearing.       Assessment & Plan:    Left calf larger than the right, onset unclear, recent airplane trip. Plan: Check a ultrasound to rule out DVT Addendum----- u/s (-), will notify py

## 2012-04-13 NOTE — Telephone Encounter (Signed)
Message copied by Nada Maclachlan on Mon Apr 13, 2012  4:53 PM ------      Message from: Willow Ora E      Created: Mon Apr 13, 2012  4:51 PM      Regarding: FW: Prelim Venous       Could you call her, ultrasound negative      ----- Message -----         From: Milbert Coulter         Sent: 04/13/2012   1:50 PM           To: Wanda Plump, MD      Subject: Prelim Venous                                            Venous duplex negative for DVT.                   ------

## 2012-04-13 NOTE — Assessment & Plan Note (Signed)
Complains of fatigue again, symptoms worse since she had what seems to be a viral URI all in his right a few months ago. Recommend observation.

## 2012-04-14 ENCOUNTER — Other Ambulatory Visit (INDEPENDENT_AMBULATORY_CARE_PROVIDER_SITE_OTHER): Payer: Medicare Other

## 2012-04-14 DIAGNOSIS — I10 Essential (primary) hypertension: Secondary | ICD-10-CM

## 2012-04-14 DIAGNOSIS — E119 Type 2 diabetes mellitus without complications: Secondary | ICD-10-CM

## 2012-04-14 LAB — LIPID PANEL
Cholesterol: 138 mg/dL (ref 0–200)
HDL: 39.4 mg/dL (ref >39.00)
LDL Cholesterol: 73 mg/dL (ref 0–99)
Total CHOL/HDL Ratio: 4
Triglycerides: 128 mg/dL (ref 0.0–149.0)
VLDL: 25.6 mg/dL (ref 0.0–40.0)

## 2012-04-14 LAB — AST: AST: 24 U/L (ref 0–37)

## 2012-04-14 LAB — ALT: ALT: 28 U/L (ref 0–35)

## 2012-04-20 ENCOUNTER — Other Ambulatory Visit (INDEPENDENT_AMBULATORY_CARE_PROVIDER_SITE_OTHER): Payer: Medicare Other

## 2012-04-20 DIAGNOSIS — E119 Type 2 diabetes mellitus without complications: Secondary | ICD-10-CM

## 2012-04-20 LAB — HEMOGLOBIN A1C: Hgb A1c MFr Bld: 8.1 % — ABNORMAL HIGH (ref 4.6–6.5)

## 2012-04-22 ENCOUNTER — Telehealth: Payer: Self-pay | Admitting: *Deleted

## 2012-04-22 DIAGNOSIS — E119 Type 2 diabetes mellitus without complications: Secondary | ICD-10-CM

## 2012-04-22 NOTE — Telephone Encounter (Signed)
Message copied by [facility], Everardo All on Wed Apr 22, 2012 10:12 AM ------      Message from: PAZ, Elita Quick E      Created: Tue Apr 21, 2012 10:32 AM       Advise patient,      A1c has decreased to 8.1, Better but not at goal. Please arrange a referral  to endocrinology, I suspect she will need a more complex diabetes regimen. ------

## 2012-04-22 NOTE — Telephone Encounter (Signed)
Discuss with patient, referral placed 

## 2012-05-11 ENCOUNTER — Ambulatory Visit: Payer: Medicare Other | Admitting: Internal Medicine

## 2012-05-18 ENCOUNTER — Ambulatory Visit (INDEPENDENT_AMBULATORY_CARE_PROVIDER_SITE_OTHER): Payer: Medicare Other | Admitting: Internal Medicine

## 2012-05-18 ENCOUNTER — Encounter: Payer: Self-pay | Admitting: Internal Medicine

## 2012-05-18 VITALS — BP 138/80 | HR 73 | Temp 97.6°F | Resp 12 | Ht 66.0 in | Wt 229.0 lb

## 2012-05-18 DIAGNOSIS — E119 Type 2 diabetes mellitus without complications: Secondary | ICD-10-CM

## 2012-05-18 MED ORDER — METFORMIN HCL 500 MG PO TABS: 500.0000 mg | ORAL_TABLET | Freq: Two times a day (BID) | ORAL | Status: DC

## 2012-05-18 NOTE — Patient Instructions (Addendum)
Please return in 2 months (after 06/20/12) with your sugar log.  Please start Metformin 500 mg with dinner x 5 days. If you tolerate this well, add another Metformin tablet (500 mg) with breakfast x 5 days. If you tolerate this well, at another metformin tablet with dinner (1000 mg) x 5 days. If you tolerate this well, had another metformin tablets with breakfast (1000 mg). Continue with 1000 mg of metformin twice a day with breakfast and dinner.   PATIENT INSTRUCTIONS FOR TYPE 2 DIABETES:  **Please join MyChart!** - see attached instructions about how to join   DIET AND EXERCISE Diet and exercise is an important part of diabetic treatment.  We recommended aerobic exercise in the form of brisk walking (working between 40-60% of maximal aerobic capacity, similar to brisk walking) for 150 minutes per week (such as 30 minutes five days per week) along with 3 times per week performing 'resistance' training (using various gauge rubber tubes with handles) 5-10 exercises involving the major muscle groups (upper body, lower body and core) performing 10-15 repetitions (or near fatigue) each exercise. Start at half the above goal but build slowly to reach the above goals. If limited by weight, joint pain, or disability, we recommend daily walking in a swimming pool with water up to waist to reduce pressure from joints while allow for adequate exercise.    BLOOD GLUCOSES Monitoring your blood glucoses is important for continued management of your diabetes. Please check your blood glucoses 2-4 times a day: fasting, before meals and at bedtime (you can rotate these measurements - e.g. one day check before the 3 meals, the next day check before 2 of the meals and before bedtime, etc.   HYPOGLYCEMIA (low blood sugar) Hypoglycemia is usually a reaction to not eating, exercising, or taking too much insulin/ other diabetes drugs.  Symptoms include tremors, sweating, hunger, confusion, headache, etc. Treat  IMMEDIATELY with 15 grams of Carbs:   4 glucose tablets    cup regular juice/soda   2 tablespoons raisins   4 teaspoons sugar   1 tablespoon honey Recheck blood glucose in 15 mins and repeat above if still symptomatic/blood glucose <100. Please contact our office at [phone] if you have questions about how to next handle your insulin.  RECOMMENDATIONS TO REDUCE YOUR RISK OF DIABETIC COMPLICATIONS: * Take your prescribed MEDICATION(S). * Follow a DIABETIC diet: Complex carbs, fiber rich foods, heart healthy fish twice weekly, (monounsaturated and polyunsaturated) fats * AVOID saturated/trans fats, high fat foods, >2,300 mg salt per day. * EXERCISE at least 5 times a week for 30 minutes or preferably daily.  * DO NOT SMOKE OR DRINK more than 1 drink a day. * Check your FEET every day. Do not wear tightfitting shoes. Contact us if you develop an ulcer * See your EYE doctor once a year or more if needed * Get a FLU shot once a year * Get a PNEUMONIA vaccine once before and once after age 87 years  GOALS:  * Your Hemoglobin A1c of <7%  * Your Systolic BP should be 140 or lower  * Your Diastolic BP should be 80 or lower  * Your HDL (Good Cholesterol) should be 40 or higher  * Your LDL (Bad Cholesterol) should be 100 or lower  * Your Triglycerides should be 150 or lower  * Your Urine microalbumin (kidney function) should be <30 * Your Body Mass Index should be 25 or lower   We will be glad to help you  achieve these goals. Our telephone number is: [phone].

## 2012-05-18 NOTE — Progress Notes (Signed)
Patient ID: [name], female   DOB: Oct 06, 1945, 67 y.o.   MRN: [id number]  HPI: [name] is a 67 y.o.-year-old female, referred by her PCP, Dr. Drue Novel, for management of DM2, insulin-dependent, uncontrolled, without complications.  Patient has been diagnosed with diabetes in ~2006; she started insulin in last few month. Last hemoglobin A1c was: Lab Results  Component Value Date   HGBA1C 8.1* 04/20/2012  Previously 9.3%.  Pt is on a regimen of: - Januvia 100 - Levemir 50 units qhs - Amaryl 4 She was on Metformin in the past but she was taken off - cannot remember why.  She was in a drug study for DM, she was on NPH - finished it ~1 mo ago. She cannot remember the doses of insulin that she took, and also he she took it twice a day or once a day. She believes she only took it at night.  Pt does not check her sugars. When she was checking them, up to 2 mo ago, they were: - am: 150-160 Had 2 lows in last few months, likely from skipping meals, but unsure. Lowest sugar was in the 60s; she has hypoglycemia awareness at 70. Highest sugar was 200s.  Pt's meals are: - Breakfast: English muffin or toast - Lunch: sandwich - Dinner: meat + vegetables - Snacks: 1-2  Pt does not have chronic kidney disease, last BUN/creatinine was:  Lab Results  Component Value Date   BUN 12 01/16/2012   CREATININE 0.80 01/16/2012  She is on the ramipril.  Last set of lipids: Lab Results  Component Value Date   CHOL 138 04/14/2012   HDL 39.40 04/14/2012   LDLCALC 73 04/14/2012   LDLDIRECT 134.7 12/06/2010   TRIG 128.0 04/14/2012   CHOLHDL 4 04/14/2012   Pt's last eye exam was in the last year. No DR. Occasionally numbness and tingling in her toes.  PMH: I reviewed her chart and she also has a history of HL - started Lipitor, hypertension, depression, IBS, restless leg syndrome, osteopenia (DEXA scan in 2009 in 2011, on calcium and vitamin D), chest pain with negative stress test 09/2009, fatty liver and  diverticulosis.  Pt has FH of DM on her mother's side. Not in her parents or sisters.   ROS: Constitutional: no weight gain/loss, no fatigue, no subjective hyperthermia/hypothermia Eyes: no blurry vision, no xerophthalmia ENT: no sore throat, no nodules palpated in throat, no dysphagia/odynophagia, no hoarseness Cardiovascular: no CP/SOB/palpitations/leg swelling Respiratory: + cough/no SOB Gastrointestinal: no N/V/D/+ C Musculoskeletal: no muscle/joint aches Skin: no rashes, but has itching in the last approximately 2 months on arms and legs Neurological: + tremors - hands/numbness/tingling/dizziness Psychiatric: + depression/+ anxiety  Past Surgical History  Procedure Laterality Date  . Uterine fibroid embolization      90s  . Tonsillectomy     History   Social History  . Marital Status: Married    Spouse Name: N/A    Number of Children: 3   Occupational History  . retired, was a Psychologist, counselling)     RN   Social History Main Topics  . Smoking status: Former Smoker -- 2.00 packs/day for 10 years    Types: Cigarettes    Quit date: 11/26/1975  . Smokeless tobacco: Never Used  . Alcohol Use: Yes     Comment: rarely  . Drug Use: No   Social History Narrative   1 grandchild    Medication Sig  . atenolol (TENORMIN) 50 MG tablet Take 1  tablet (50 mg total) by mouth daily.  Marland Kitchen atorvastatin (LIPITOR) 20 MG tablet Take 1 tablet (20 mg total) by mouth daily.  . DULoxetine (CYMBALTA) 60 MG capsule Take 60 mg by mouth daily.   Marland Kitchen glimepiride (AMARYL) 4 MG tablet Take 4 mg by mouth. TAKE 1 TABLET (4 MG TOTAL) BY MOUTH TWO TIMES A DAY.  Marland Kitchen glucose blood (ONE TOUCH ULTRA TEST) test strip Check twice a day  . insulin detemir (LEVEMIR FLEXPEN) 100 UNIT/ML injection Inject 0.5 mLs (50 Units total) into the skin daily.  . Insulin Pen Needle (PEN NEEDLES 31GX5/16") 31G X 8 MM MISC Lantus solostar pen.  Marland Kitchen JANUVIA 100 MG tablet TAKE 1 TABLET (100 MG TOTAL) BY MOUTH DAILY.  Marland Kitchen  omeprazole (PRILOSEC) 20 MG capsule TAKE 1 CAPSULE EVERY DAY  . ramipril (ALTACE) 5 MG capsule Take 1 capsule (5 mg total) by mouth daily.  Marland Kitchen rOPINIRole (REQUIP) 1 MG tablet Take 1 mg by mouth at bedtime.     No current facility-administered medications on file prior to visit.   Allergies  Allergen Reactions  . Penicillins     REACTION: unspecified   Family History  Problem Relation Age of Onset  . Lung cancer Mother     smoker  . Ovarian cancer Paternal Aunt     ?  Marland Kitchen Breast cancer Neg Hx   . Diabetes      aunts-uncles   . Colon cancer Father     F dx in his 56s  . Heart attack      GM in her 60s  . Stroke      aunts-uncles    PE: BP 138/80  Pulse 73  Temp(Src) 97.6 F (36.4 C) (Oral)  Resp 12  Ht 5\' 6"  (1.676 m)  Wt 229 lb (103.874 kg)  BMI 36.98 kg/m2  SpO2 97% Wt Readings from Last 3 Encounters:  05/18/12 229 lb (103.874 kg)  04/13/12 220 lb (99.791 kg)  03/02/12 223 lb (101.152 kg)  Constitutional: overweight, in NAD Eyes: PERRLA, EOMI, no exophthalmos ENT: moist mucous membranes, no thyromegaly, no cervical lymphadenopathy Cardiovascular: RRR, No MRG Respiratory: CTA B Gastrointestinal: abdomen soft, NT, ND, BS+ Musculoskeletal: no deformities, strength intact in all 4 Skin: moist, warm, no rashes Neurological: mild tremor with outstretched hands, DTR normal in all 4  ASSESSMENT: 1. DM2, insulin-dependent, uncontrolled, without complications  PLAN:  1. Patient with poorly controlled diabetes, recently slightly improved, with a last hemoglobin A1c of 8.1. She is on a regimen of basal insulin 50 units recently switched to Levemir which is better covered by her insurance, and is also on Januvia and Amaryl. but not metformin anymore. She does not have chronic kidney disease, and she tolerated the metformin well in the past. - we discussed about the importance of checking her sugars and writing them down - given sugar log and advised how to fill it and to  bring it at next appt - given foot care handout and explained the principles - given instructions for hypoglycemia management "15-15 rule" - given general instructions about diabetes - given brochure describing injection site rotation - For now, will only add metformin, advancing from 500 mg daily to target dose of 1000 mg bid.  - since I do not have sugars to go by, I would maintain the rest of her regimen as it is, and I would see the patient back in 2 months, at that time, we'll see what type of additional changes we need to make -  No labs for today - we'll do a foot exam and will check a hemoglobin A1c at next visit - Return to clinic in 2 months with her sugar log

## 2012-05-19 ENCOUNTER — Encounter: Payer: Self-pay | Admitting: Internal Medicine

## 2012-05-19 DIAGNOSIS — E119 Type 2 diabetes mellitus without complications: Secondary | ICD-10-CM

## 2012-05-19 NOTE — Progress Notes (Signed)
Received message from PCP that the metformin was stopped for elevated liver enzymes. Will continue with it for now (she does have a h/o fatty liver) and will need LFTs check in 2 months, when she returns.

## 2012-05-20 MED ORDER — GLUCOSE BLOOD VI STRP: ORAL_STRIP | Status: DC

## 2012-05-31 ENCOUNTER — Other Ambulatory Visit: Payer: Self-pay | Admitting: Internal Medicine

## 2012-06-02 NOTE — Telephone Encounter (Signed)
Pt is now seeing endocrinology. OK to refill? Or send to Dr. Elvera Lennox?

## 2012-06-02 NOTE — Telephone Encounter (Signed)
Per endocrinology 

## 2012-06-17 ENCOUNTER — Other Ambulatory Visit: Payer: Self-pay | Admitting: Internal Medicine

## 2012-06-29 ENCOUNTER — Telehealth: Payer: Self-pay | Admitting: Internal Medicine

## 2012-06-29 DIAGNOSIS — R911 Solitary pulmonary nodule: Secondary | ICD-10-CM

## 2012-06-29 NOTE — Telephone Encounter (Signed)
Pt states she was suppose to have a CT scan of her chest follow-up in 6 months which would be in July but pt states she would like to have one sooner because she has been experiencing fatigue, lack of motivation, wheezing, a dry cough & she did cough up a little blood one time. Please advise.

## 2012-06-29 NOTE — Telephone Encounter (Signed)
Patient is calling to inquire about if Dr. Drue Novel can order a CT of her chest. States that she is having some symptoms and thinks she needs one.

## 2012-06-30 ENCOUNTER — Ambulatory Visit (INDEPENDENT_AMBULATORY_CARE_PROVIDER_SITE_OTHER): Payer: Medicare Other | Admitting: Internal Medicine

## 2012-06-30 ENCOUNTER — Encounter: Payer: Self-pay | Admitting: Internal Medicine

## 2012-06-30 VITALS — BP 142/92 | HR 93 | Temp 97.6°F | Wt 218.8 lb

## 2012-06-30 DIAGNOSIS — R059 Cough, unspecified: Secondary | ICD-10-CM

## 2012-06-30 DIAGNOSIS — R5381 Other malaise: Secondary | ICD-10-CM

## 2012-06-30 DIAGNOSIS — I10 Essential (primary) hypertension: Secondary | ICD-10-CM

## 2012-06-30 DIAGNOSIS — R05 Cough: Secondary | ICD-10-CM

## 2012-06-30 DIAGNOSIS — R5383 Other fatigue: Secondary | ICD-10-CM

## 2012-06-30 MED ORDER — LOSARTAN POTASSIUM 50 MG PO TABS: 50.0000 mg | ORAL_TABLET | Freq: Every day | ORAL | Status: DC

## 2012-06-30 MED ORDER — DOXYCYCLINE HYCLATE 100 MG PO TABS: 100.0000 mg | ORAL_TABLET | Freq: Two times a day (BID) | ORAL | Status: DC

## 2012-06-30 MED ORDER — ALBUTEROL SULFATE HFA 108 (90 BASE) MCG/ACT IN AERS: 2.0000 | INHALATION_SPRAY | Freq: Four times a day (QID) | RESPIRATORY_TRACT | Status: DC | PRN

## 2012-06-30 NOTE — Telephone Encounter (Signed)
1. she needs to be seen, bronchitis? Please arrange if patient is willing to come, I wion't be able to see her but she could be seen by another practitioner or at the [facility] 2. Will schedule a CT

## 2012-06-30 NOTE — Assessment & Plan Note (Signed)
Severe fatigue, see history of present illness.  No evidence of CHF on clinical grounds, depression seems well controlled. Plan: Will check a CBC, BMP and TSH. If labs normal, I will recommend to go back to pulmonary for  possibly a CPAP, she is currently using a dental appliance.

## 2012-06-30 NOTE — Telephone Encounter (Signed)
Discussed with pt & scheduled appt w/ Dr. Drue Novel today @ 345p.

## 2012-06-30 NOTE — Assessment & Plan Note (Addendum)
Several months history of frequent cough, one-week history of increasing. She is a former heavy smoker, 2 pack a day. She is on Altace, has some postnasal dripping. DDX includes ACEi , Postnasal dripping, Atypical infection and COPD Plan: Discontinue Altace, start losartan Albuterol prn PFTs if no better doxycycline We are scheduling a CT chest for  f/u of pulmonary nodule Reassess in one month

## 2012-06-30 NOTE — Progress Notes (Signed)
  Subjective:    Patient ID: [name], female    DOB: 1945/10/01, 67 y.o.   MRN: [id number]  HPI Acute visit On and off cough  X 3 months, sometimes she is able to bring up beige sputum. Last 3 days she also has noted occasional wheezing.  Continue with fatigue, needs to take a nap once or twice a day.  She has a history of a sleep apnea, mild, decided not to use CPAP and is rather using a dental appliance, apparently while she uses the dental appliance she snores less.  Past Medical History  Diagnosis Date  . Diabetes mellitus   . Depression     sees Dr.Cotle  . Hyperlipemia   . Hypertension   . IBS (irritable bowel syndrome)     and dyspepsia  . RLS (restless legs syndrome)   . Adenomatous colon polyp 03/1988  . Osteopenia     dexa 06/2007 and 10/11 Rx CA vitamin d  . Chest pain      Sept 2011: stress test neg  . Diverticulosis   . Fatty liver     Increased LFTs, saw GI 06/2011, likely from fatty liver    Past Surgical History  Procedure Laterality Date  . Uterine fibroid embolization      90s  . Tonsillectomy      Review of Systems Depression currently well controlled No actual dyspnea on exertion. No GERD symptoms Mild postnasal dripping. No chest pain, palpitation or lower extremity edema.     Objective:   Physical Exam BP 142/92  Pulse 93  Temp(Src) 97.6 F (36.4 C) (Oral)  Wt 218 lb 12.8 oz (99.247 kg)  BMI 35.33 kg/m2  SpO2 99%  General -- alert, well-developed, NAD .   Neck --no JVD at 45 HEENT -- TMs normal, throat w/o redness, face symmetric and not tender to palpation, Nose with minimal congestion  Lungs -- normal respiratory effort, no intercostal retractions, no accessory muscle use, and normal breath sounds.   Heart-- normal rate, regular rhythm, no murmur, and no gallop.   Extremities-- no pretibial edema bilaterally Neurologic-- alert & oriented X3 and strength normal in all extremities. Psych-- Cognition and judgment appear intact.  Alert and cooperative with normal attention span and concentration.  not anxious appearing and not depressed appearing.       Assessment & Plan:

## 2012-06-30 NOTE — Assessment & Plan Note (Signed)
Discontinue altace d/t cough, start losartan, follow up in one month

## 2012-06-30 NOTE — Patient Instructions (Signed)
Stop Altace, take losartan. Doxycycline 100 mg one tablet twice a day for one week. Mucinex DM twice a day as needed for cough Albuterol if wheezing Come back in one month, call soon if problems.

## 2012-07-01 ENCOUNTER — Telehealth: Payer: Self-pay | Admitting: Internal Medicine

## 2012-07-01 LAB — BASIC METABOLIC PANEL
BUN: 8 mg/dL (ref 6–23)
CO2: 21 mEq/L (ref 19–32)
Calcium: 9.7 mg/dL (ref 8.4–10.5)
Chloride: 101 mEq/L (ref 96–112)
Creatinine, Ser: 0.8 mg/dL (ref 0.4–1.2)
GFR: 73.82 mL/min (ref >60.00)
Glucose, Bld: 140 mg/dL — ABNORMAL HIGH (ref 70–99)
Potassium: 3.9 mEq/L (ref 3.5–5.1)
Sodium: 134 mEq/L — ABNORMAL LOW (ref 135–145)

## 2012-07-01 LAB — CBC WITH DIFFERENTIAL/PLATELET
Basophils Absolute: 0.1 10*3/uL (ref 0.0–0.1)
Basophils Relative: 0.6 % (ref 0.0–3.0)
Eosinophils Absolute: 0.4 10*3/uL (ref 0.0–0.7)
Eosinophils Relative: 3.5 % (ref 0.0–5.0)
HCT: 39.8 % (ref 36.0–46.0)
Hemoglobin: 13.6 g/dL (ref 12.0–15.0)
Lymphocytes Relative: 30.7 % (ref 12.0–46.0)
Lymphs Abs: 3.3 10*3/uL (ref 0.7–4.0)
MCHC: 34.2 g/dL (ref 30.0–36.0)
MCV: 79.3 fl (ref 78.0–100.0)
Monocytes Absolute: 0.4 10*3/uL (ref 0.1–1.0)
Monocytes Relative: 3.3 % (ref 3.0–12.0)
Neutro Abs: 6.8 10*3/uL (ref 1.4–7.7)
Neutrophils Relative %: 61.9 % (ref 43.0–77.0)
Platelets: 246 10*3/uL (ref 150.0–400.0)
RBC: 5.02 Mil/uL (ref 3.87–5.11)
RDW: 14.9 % — ABNORMAL HIGH (ref 11.5–14.6)
WBC: 10.9 10*3/uL — ABNORMAL HIGH (ref 4.5–10.5)

## 2012-07-01 LAB — TSH: TSH: 0.6 u[IU]/mL (ref 0.35–5.50)

## 2012-07-01 NOTE — Telephone Encounter (Signed)
NEEDING TO SPEAK WITH NURSE ABOUT RX DOSAGE ON A COUPLE DIFFERENT MEDICATIONS.

## 2012-07-02 ENCOUNTER — Telehealth: Payer: Self-pay | Admitting: Internal Medicine

## 2012-07-02 ENCOUNTER — Ambulatory Visit (INDEPENDENT_AMBULATORY_CARE_PROVIDER_SITE_OTHER)
Admission: RE | Admit: 2012-07-02 | Discharge: 2012-07-02 | Disposition: A | Discharge status: Home / Self Care | Payer: Medicare Other | Source: Ambulatory Visit | Attending: Internal Medicine | Admitting: Internal Medicine

## 2012-07-02 ENCOUNTER — Other Ambulatory Visit: Payer: Self-pay | Admitting: *Deleted

## 2012-07-02 DIAGNOSIS — R911 Solitary pulmonary nodule: Secondary | ICD-10-CM

## 2012-07-02 MED ORDER — METFORMIN HCL 500 MG PO TABS: 1000.0000 mg | ORAL_TABLET | Freq: Two times a day (BID) | ORAL | Status: DC

## 2012-07-02 NOTE — Telephone Encounter (Signed)
Patient called stating she is on metformin and is having a CT scan today at 3. She is calling to find out if there is an issue with her being on this medication and having the CT scan w/contrast. Please advise.

## 2012-07-02 NOTE — Telephone Encounter (Signed)
Pt called and stated she is doing well on the 1000mg  of metformin. Pt needs a new rx with the exact directions sent to her pharmacy. Advised pt we would do that.

## 2012-07-02 NOTE — Telephone Encounter (Signed)
Discuss with patient  

## 2012-07-02 NOTE — Telephone Encounter (Signed)
Should hold Metformin today and for next 2 days and then restart normally on Sunday

## 2012-07-03 ENCOUNTER — Telehealth: Payer: Self-pay | Admitting: Internal Medicine

## 2012-07-03 NOTE — Telephone Encounter (Signed)
lmovm for pt to return call.  

## 2012-07-03 NOTE — Telephone Encounter (Signed)
Patient is calling back for her results 

## 2012-07-03 NOTE — Telephone Encounter (Signed)
Discussed CT results w/ pt. Please advise on pt's recent lab results.

## 2012-07-03 NOTE — Telephone Encounter (Signed)
Message copied by Nada Maclachlan on Fri Jul 03, 2012  3:56 PM ------      Message from: Pecola Lawless      Created: Fri Jul 03, 2012  2:38 PM       Needs A1c next month ; A1c was 8.1 in 4/14      The white count is slightly elevated; please report any signs or symptoms of fever such as fever, cough, nasal discharge, etc. No infection seen on CT scan.      RDW measures size of red cells ; it is not important unless anemia is present.      Decreased sodium is not significant; high glucose can decrease this. ------

## 2012-07-03 NOTE — Telephone Encounter (Signed)
Patient is calling to request results from her CT from yesterday and labs from Tuesday, 6/24.

## 2012-07-06 NOTE — Telephone Encounter (Signed)
Discussed with pt

## 2012-07-12 ENCOUNTER — Encounter: Payer: Self-pay | Admitting: Internal Medicine

## 2012-07-13 ENCOUNTER — Encounter: Payer: Self-pay | Admitting: Internal Medicine

## 2012-07-20 ENCOUNTER — Encounter: Payer: Self-pay | Admitting: Internal Medicine

## 2012-07-20 ENCOUNTER — Telehealth: Payer: Self-pay | Admitting: Internal Medicine

## 2012-07-20 ENCOUNTER — Ambulatory Visit (INDEPENDENT_AMBULATORY_CARE_PROVIDER_SITE_OTHER): Payer: Medicare Other | Admitting: Internal Medicine

## 2012-07-20 VITALS — BP 118/62 | HR 81 | Temp 98.1°F | Resp 12 | Ht 66.0 in | Wt 222.0 lb

## 2012-07-20 DIAGNOSIS — K760 Fatty (change of) liver, not elsewhere classified: Secondary | ICD-10-CM

## 2012-07-20 DIAGNOSIS — K7689 Other specified diseases of liver: Secondary | ICD-10-CM

## 2012-07-20 DIAGNOSIS — N289 Disorder of kidney and ureter, unspecified: Secondary | ICD-10-CM

## 2012-07-20 DIAGNOSIS — E119 Type 2 diabetes mellitus without complications: Secondary | ICD-10-CM

## 2012-07-20 LAB — HEPATIC FUNCTION PANEL
ALT: 61 U/L — ABNORMAL HIGH (ref 0–35)
AST: 51 U/L — ABNORMAL HIGH (ref 0–37)
Albumin: 4 g/dL (ref 3.5–5.2)
Alkaline Phosphatase: 64 U/L (ref 39–117)
Bilirubin, Direct: 0 mg/dL (ref 0.0–0.3)
Total Bilirubin: 0.5 mg/dL (ref 0.3–1.2)
Total Protein: 7.8 g/dL (ref 6.0–8.3)

## 2012-07-20 LAB — HM DIABETES EYE EXAM

## 2012-07-20 LAB — HEMOGLOBIN A1C: Hgb A1c MFr Bld: 8.8 % — ABNORMAL HIGH (ref 4.6–6.5)

## 2012-07-20 MED ORDER — CANAGLIFLOZIN 300 MG PO TABS: 1.0000 | ORAL_TABLET | Freq: Every day | ORAL | Status: DC

## 2012-07-20 NOTE — Telephone Encounter (Signed)
Arrange an abdominal US. Dx fatty liver and kidney lesion

## 2012-07-20 NOTE — Patient Instructions (Addendum)
Please return in 1 month. Take Invokana 100 mg in am before breakfast for 10 days and then, if sugars still 180 later in the day, switch to 300 mg daily. Stop Amaryl for now.

## 2012-07-20 NOTE — Progress Notes (Signed)
Patient ID: [name], female   DOB: Mar 13, 1945, 67 y.o.   MRN: [id number]  HPI: [name] is a 67 y.o.-year-old female, returning for f/u for DM2, dx 2006, insulin-dependent, uncontrolled, without complications. Last visit 2 mo ago.  Last hemoglobin A1c was: Lab Results  Component Value Date   HGBA1C 8.1* 04/20/2012  Previously 9.3%.  Pt is on a regimen of: - Januvia 100 - Levemir 50 units qhs - pens - Amaryl 4 bid - we added Metformin 1000 bid at last visit. Since she had transaminitis in the past 2/2 fatty liver, we will need to check LFTs at this visit. She was on Metformin in the past but she was taken off for the above reason.  Pt checks her sugars now (1-2 x a day) and brings a log - am: 150-160 >> 119-165 in last 2 weeks - before lunch: 129, one check - bedtime not checked lately, before: 200s No lows; she has hypoglycemia awareness at 70. Highest sugar was 1 mo ago, in the 300s, at bedtime, close to her snack  Pt does not have chronic kidney disease, last BUN/creatinine was:  Lab Results  Component Value Date   BUN 8 06/30/2012   CREATININE 0.8 06/30/2012  She is on the Losartan.  Last set of lipids reviewed: Lab Results  Component Value Date   CHOL 138 04/14/2012   HDL 39.40 04/14/2012   LDLCALC 73 04/14/2012   LDLDIRECT 134.7 12/06/2010   TRIG 128.0 04/14/2012   CHOLHDL 4 04/14/2012  She is on Lipitor 20.  Pt's last eye exam was in the last year. No DR. Occasionally numbness and tingling in her toes.  PMH: She also has a history of HL - started Lipitor, hypertension, depression, IBS, restless leg syndrome, osteopenia (DEXA scan in 2009 in 2011, on calcium and vitamin D), chest pain with negative stress test 09/2009, fatty liver, and diverticulosis.  ROS: Constitutional: no weight gain/loss, + fatigue, no subjective hyperthermia/hypothermia Eyes: no blurry vision, no xerophthalmia ENT: no sore throat, no nodules palpated in throat, no dysphagia/odynophagia, no  hoarseness Cardiovascular: no CP/SOB/palpitations/leg swelling Respiratory: no cough/SOB Gastrointestinal: no N/V/D/C Musculoskeletal: no muscle/joint aches Skin: no rashes Neurological: no tremors/numbness/tingling/dizziness  I reviewed pt's medications, allergies, PMH, social hx, family hx and no changes required, except she stopped Altace and started Losartan instead.  PE: BP 118/62  Pulse 81  Temp(Src) 98.1 F (36.7 C) (Oral)  Resp 12  Ht 5\' 6"  (1.676 m)  Wt 222 lb (100.699 kg)  BMI 35.85 kg/m2  SpO2 96% Wt Readings from Last 3 Encounters:  07/20/12 222 lb (100.699 kg)  06/30/12 218 lb 12.8 oz (99.247 kg)  05/18/12 229 lb (103.874 kg)  Constitutional: overweight, in NAD Eyes: PERRLA, EOMI, no exophthalmos ENT: moist mucous membranes, no thyromegaly, no cervical lymphadenopathy Cardiovascular: RRR, No MRG Respiratory: CTA B Gastrointestinal: abdomen soft, NT, ND, BS+ Musculoskeletal: no deformities, strength intact in all 4 Skin: moist, warm, no rashes Neurological: mild tremor with outstretched hands, DTR normal in all 4  I performed a foot exam today  ASSESSMENT: 1. DM2, insulin-dependent, uncontrolled, without complications  PLAN:  1. Patient with poorly controlled diabetes, recently improved, with a last hemoglobin A1c of 8.1. She is on a regimen of basal insulin 50 unitsis also on Januvia and Amaryl. but not metformin anymore. She does not have chronic kidney disease, and she tolerated the metformin well in the past, but had elevated LFTs in the context of fatty liver.  - We  will check LFTs again today, after 2 mo of Metformin. - For now, will continue metformin 1000 mg bid - pt did check sugars but only in am lately. Looking back in her log, her sugars at night are in the 200s-few300s (bedtime), with a staircase effect.  - in this case, I believe we need more help with mealtime sugars >> I suggested we try Invokana, starting at 100 mg in am and increasing to 300 mg  if needed (I advised her to increase if sugars later in the day >180) - I gave her samples of both 100 and 300 mg - advised her to continue Januvia 100 mg - will stop Amaryl for now - will check a HbA1C and LFTs today - I performed a foot exam today - Return to clinic in 1 month with her sugar log  Office Visit on 07/20/2012  Component Date Value Range Status  . Hemoglobin A1C 07/20/2012 8.8* 4.6 - 6.5 % Final   Glycemic Control Guidelines for People with Diabetes:Non Diabetic:  <6%Goal of Therapy: <7%Additional Action Suggested:  >8%   . Total Bilirubin 07/20/2012 0.5  0.3 - 1.2 mg/dL Final  . Bilirubin, Direct 07/20/2012 0.0  0.0 - 0.3 mg/dL Final  . Alkaline Phosphatase 07/20/2012 64  39 - 117 U/L Final  . AST 07/20/2012 51* 0 - 37 U/L Final  . ALT 07/20/2012 61* 0 - 35 U/L Final  . Total Protein 07/20/2012 7.8  6.0 - 8.3 g/dL Final  . Albumin 16/10/9602 4.0  3.5 - 5.2 g/dL Final   LFTs increased >> will decrease Metformin to half (only with dinner). Will recheck LFTs at next visit.

## 2012-07-21 MED ORDER — METFORMIN HCL 500 MG PO TABS: 1000.0000 mg | ORAL_TABLET | Freq: Every day | ORAL | Status: DC

## 2012-07-21 NOTE — Telephone Encounter (Signed)
Discussed with patient, verbalized understanding. Orders placed.

## 2012-07-23 ENCOUNTER — Encounter: Payer: Self-pay | Admitting: Internal Medicine

## 2012-07-23 ENCOUNTER — Ambulatory Visit (INDEPENDENT_AMBULATORY_CARE_PROVIDER_SITE_OTHER): Payer: Medicare Other | Admitting: Internal Medicine

## 2012-07-23 VITALS — BP 120/80 | HR 78 | Temp 98.3°F | Wt 218.8 lb

## 2012-07-23 DIAGNOSIS — R5381 Other malaise: Secondary | ICD-10-CM

## 2012-07-23 DIAGNOSIS — K7689 Other specified diseases of liver: Secondary | ICD-10-CM

## 2012-07-23 DIAGNOSIS — R05 Cough: Secondary | ICD-10-CM

## 2012-07-23 DIAGNOSIS — K760 Fatty (change of) liver, not elsewhere classified: Secondary | ICD-10-CM

## 2012-07-23 DIAGNOSIS — R5383 Other fatigue: Secondary | ICD-10-CM

## 2012-07-23 DIAGNOSIS — I10 Essential (primary) hypertension: Secondary | ICD-10-CM

## 2012-07-23 DIAGNOSIS — R911 Solitary pulmonary nodule: Secondary | ICD-10-CM

## 2012-07-23 DIAGNOSIS — R059 Cough, unspecified: Secondary | ICD-10-CM

## 2012-07-23 DIAGNOSIS — E119 Type 2 diabetes mellitus without complications: Secondary | ICD-10-CM

## 2012-07-23 LAB — BASIC METABOLIC PANEL
BUN: 10 mg/dL (ref 6–23)
CO2: 24 mEq/L (ref 19–32)
Calcium: 10 mg/dL (ref 8.4–10.5)
Chloride: 98 mEq/L (ref 96–112)
Creatinine, Ser: 0.8 mg/dL (ref 0.4–1.2)
GFR: 71.79 mL/min (ref >60.00)
Glucose, Bld: 140 mg/dL — ABNORMAL HIGH (ref 70–99)
Potassium: 3.9 mEq/L (ref 3.5–5.1)
Sodium: 134 mEq/L — ABNORMAL LOW (ref 135–145)

## 2012-07-23 NOTE — Patient Instructions (Addendum)
Use Flonase consistently Schedule a visit in 4 months, yearly checkup

## 2012-07-23 NOTE — Assessment & Plan Note (Signed)
CT done 06/2012, nodule stable next 06/2013

## 2012-07-23 NOTE — Progress Notes (Signed)
  Subjective:    Patient ID: [name], female    DOB: [birth year], 67 y.o.   MRN: [id number]  HPI Here for a follow up regards  cough, fatigue and recent CT of chest . ACEi were d/c, cough slt better, losartan is working well as far as controlling her blood pressure. Fatigue has decreased a little. CT of the chest was stable.  Past Medical History  Diagnosis Date  . Diabetes mellitus   . Depression     sees Dr.Cotle  . Hyperlipemia   . Hypertension   . IBS (irritable bowel syndrome)     and dyspepsia  . RLS (restless legs syndrome)   . Adenomatous colon polyp 03/1988  . Osteopenia     dexa 06/2007 and 10/11 Rx CA vitamin d  . Chest pain      Sept 2011: stress test neg  . Diverticulosis   . Fatty liver     Increased LFTs, saw GI 06/2011, likely from fatty liver    Past Surgical History  Procedure Laterality Date  . Uterine fibroid embolization      90s  . Tonsillectomy      Review of Systems Reports postnasal dripping, normal sore throat, wheezing, very mild sputum production. She saw endocrinology,invokana was added.    Objective:   Physical Exam BP 120/80  Pulse 78  Temp(Src) 98.3 F (36.8 C) (Oral)  Wt 218 lb 12.8 oz (99.247 kg)  BMI 35.33 kg/m2  SpO2 96%  General -- alert, well-developed, NAD .   HEENT -- Nose is slightly congested, face symmetric, mild tenderness at the right sinus.  Lungs -- normal respiratory effort, no intercostal retractions, no accessory muscle use, and normal breath sounds.   Heart-- normal rate, regular rhythm, no murmur, and no gallop.    Psych-- Cognition and judgment appear intact. Alert and cooperative with normal attention span and concentration.  not anxious appearing and not depressed appearing.      Assessment & Plan:

## 2012-07-23 NOTE — Assessment & Plan Note (Signed)
Well-controlled with losartan, check a BMP

## 2012-07-23 NOTE — Assessment & Plan Note (Signed)
Improve after we discontinue ACE inhibitors. She did report postnasal dripping consequently I recommend to use Flonase consistently.

## 2012-07-23 NOTE — Assessment & Plan Note (Signed)
Now under the care of  endocrinology

## 2012-07-23 NOTE — Assessment & Plan Note (Addendum)
Continue with fatigue, slightly better?. Labs were essentially normal except for slightly elevated white count. Again we discussed possibly going back to pulmonary (?CPAP), at this point patient is reluctant, fatigue she said it is not affecting her quality of life. She prefers to continue using a dental appliance.

## 2012-07-23 NOTE — Assessment & Plan Note (Addendum)
History of fatty liver and abnormal ultrasound of the abdomen, see report below. Plan: Repeat ultrasound to be done tomorrow ------- Ultrasound of the abdomen 05/2011: 1. Diffusely echogenic hepatic parenchyma is suggestive of hepatic  steatosis.  2. 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 This  could be better characterized with contrast enhanced MRI if  clinically indicated.

## 2012-07-24 ENCOUNTER — Ambulatory Visit
Admission: RE | Admit: 2012-07-24 | Discharge: 2012-07-24 | Disposition: A | Discharge status: Home / Self Care | Payer: Medicare Other | Source: Ambulatory Visit | Attending: Internal Medicine | Admitting: Internal Medicine

## 2012-07-24 DIAGNOSIS — K760 Fatty (change of) liver, not elsewhere classified: Secondary | ICD-10-CM

## 2012-07-24 DIAGNOSIS — N289 Disorder of kidney and ureter, unspecified: Secondary | ICD-10-CM

## 2012-07-27 ENCOUNTER — Telehealth: Payer: Self-pay | Admitting: *Deleted

## 2012-07-27 NOTE — Telephone Encounter (Signed)
Patient reports given samples of Invokana 100 mg, for 10 days, going out of town and is out of samples to use with planned travel. Requesting more samples/SLS Please advise.

## 2012-07-27 NOTE — Telephone Encounter (Signed)
LMOM with contact name and number for return call RE: No requested samples available at this time & clarification of date when leaving to go out of town, as we could possibly be receiving samples in before her departure date/SLS

## 2012-07-27 NOTE — Telephone Encounter (Signed)
Sure, can come and pick up if we have them. Thank you!

## 2012-07-28 ENCOUNTER — Telehealth: Payer: Self-pay | Admitting: *Deleted

## 2012-07-28 MED ORDER — CANAGLIFLOZIN 300 MG PO TABS: 1.0000 | ORAL_TABLET | Freq: Every day | ORAL | Status: DC

## 2012-07-28 NOTE — Telephone Encounter (Signed)
Rx sent and patient is aware. 

## 2012-07-28 NOTE — Telephone Encounter (Signed)
Yes, please. Thank you.

## 2012-07-28 NOTE — Telephone Encounter (Signed)
Patient is in [facility] [place] and requesting a prescription for Invokana 100mg .  The phone number for the pharmacy is [phone].  Okay to sent Rx?

## 2012-07-29 ENCOUNTER — Telehealth: Payer: Self-pay | Admitting: *Deleted

## 2012-07-29 NOTE — Telephone Encounter (Signed)
Pt called to request an rx be sent to a [facility] store in [facility] [place] for her Invokana 100 MG, she said she is on vacation there and is almost out of the samples, the store is on [address] or [address].

## 2012-07-29 NOTE — Telephone Encounter (Signed)
Lelan Pons sent the Rx yesterday, I thought.Marland KitchenMarland Kitchen

## 2012-07-30 MED ORDER — CANAGLIFLOZIN 300 MG PO TABS: 1.0000 | ORAL_TABLET | Freq: Every day | ORAL | Status: DC

## 2012-07-30 NOTE — Telephone Encounter (Signed)
Pt said it was for Janumet and it was sent to a local pharmacy here.

## 2012-07-30 NOTE — Telephone Encounter (Signed)
[name], can you send the Invokana? Thank you.

## 2012-07-30 NOTE — Telephone Encounter (Signed)
Rx resent.

## 2012-08-06 LAB — HM DIABETES EYE EXAM

## 2012-08-12 ENCOUNTER — Other Ambulatory Visit: Payer: Self-pay

## 2012-08-20 ENCOUNTER — Encounter: Payer: Self-pay | Admitting: Internal Medicine

## 2012-08-20 ENCOUNTER — Ambulatory Visit (INDEPENDENT_AMBULATORY_CARE_PROVIDER_SITE_OTHER): Payer: Medicare Other | Admitting: Internal Medicine

## 2012-08-20 VITALS — BP 122/68 | HR 86 | Temp 97.6°F | Resp 12 | Ht 66.0 in | Wt 216.0 lb

## 2012-08-20 DIAGNOSIS — K760 Fatty (change of) liver, not elsewhere classified: Secondary | ICD-10-CM

## 2012-08-20 DIAGNOSIS — E119 Type 2 diabetes mellitus without complications: Secondary | ICD-10-CM

## 2012-08-20 DIAGNOSIS — R7401 Elevation of levels of liver transaminase levels: Secondary | ICD-10-CM

## 2012-08-20 DIAGNOSIS — R7402 Elevation of levels of lactic acid dehydrogenase (LDH): Secondary | ICD-10-CM

## 2012-08-20 DIAGNOSIS — K7689 Other specified diseases of liver: Secondary | ICD-10-CM

## 2012-08-20 LAB — URINALYSIS
Bilirubin Urine: NEGATIVE
Hgb urine dipstick: NEGATIVE
Ketones, ur: NEGATIVE
Leukocytes, UA: NEGATIVE
Nitrite: NEGATIVE
Specific Gravity, Urine: 1.01 (ref 1.000–1.030)
Total Protein, Urine: NEGATIVE
Urine Glucose: >=1000
Urobilinogen, UA: 0.2 (ref 0.0–1.0)
pH: 5.5 (ref 5.0–8.0)

## 2012-08-20 LAB — COMPREHENSIVE METABOLIC PANEL
ALT: 96 U/L — ABNORMAL HIGH (ref 0–35)
AST: 70 U/L — ABNORMAL HIGH (ref 0–37)
Albumin: 4.1 g/dL (ref 3.5–5.2)
Alkaline Phosphatase: 69 U/L (ref 39–117)
BUN: 11 mg/dL (ref 6–23)
CO2: 25 mEq/L (ref 19–32)
Calcium: 9.3 mg/dL (ref 8.4–10.5)
Chloride: 101 mEq/L (ref 96–112)
Creatinine, Ser: 0.8 mg/dL (ref 0.4–1.2)
GFR: 72.77 mL/min (ref >60.00)
Glucose, Bld: 221 mg/dL — ABNORMAL HIGH (ref 70–99)
Potassium: 4 mEq/L (ref 3.5–5.1)
Sodium: 134 mEq/L — ABNORMAL LOW (ref 135–145)
Total Bilirubin: 0.7 mg/dL (ref 0.3–1.2)
Total Protein: 8.2 g/dL (ref 6.0–8.3)

## 2012-08-20 NOTE — Patient Instructions (Signed)
Decrease the metformin to 1000 mg once daily. Try decreasing the Levemir to 30 units at night, but go back to 50 units if sugars increase to >140. Continue Januvia 100 mg. Continue Invokana 300 mg. Please stop at the lab.  Re-join MyChart.

## 2012-08-20 NOTE — Progress Notes (Signed)
Patient ID: [name], female   DOB: [birth year], 67 y.o.   MRN: [id number]  HPI: [name] is a 67 y.o.-year-old female, returning for f/u for DM2, dx 2006, insulin-dependent, uncontrolled, without complications. Last visit 1 mo ago.  Last hemoglobin A1c was: Lab Results  Component Value Date   HGBA1C 8.8* 07/20/2012  Increased from 8.1%.  Pt is on a regimen of: - Januvia 100 - Levemir 50 units qhs - pens - we stopped Amaryl 4 bid at last visit - we added Metformin 500 bid 3 mo ago, at last visit I advised her to decrease the dose to 1000 mg at night, since her LFTs increased, but she did not do this (forgot) - added Invokana 100 >> 300 for last 3 weeks Since she had transaminitis in the past 2/2 fatty liver. She was on Metformin in the past but she was taken off for the above reason.  Pt checks her sugars now (1-2 x a day) - does not bring a log, but per download of her meter: - am: 150-160 >> 119-165 in last 2 weeks >> 130-150 - before lunch: 129, one check >> 110 - before dinner: 149 and 183 - bedtime not checked lately, before: 200s >> 137 and 220 Mean 153. She tells me she missed Levemir 2 nights and sugars were the same in am. No lows; she has hypoglycemia awareness at 70. Highest sugar was 220 at bedtime x1.  Pt does not have chronic kidney disease, last BUN/creatinine was:  Lab Results  Component Value Date   BUN 10 07/23/2012   CREATININE 0.8 07/23/2012  She is on the Losartan.  Last set of lipids reviewed: Lab Results  Component Value Date   CHOL 138 04/14/2012   HDL 39.40 04/14/2012   LDLCALC 73 04/14/2012   LDLDIRECT 134.7 12/06/2010   TRIG 128.0 04/14/2012   CHOLHDL 4 04/14/2012  She is on Lipitor 20.  Pt's last eye exam was 1 mo ago >> no DR. No DR. Occasionally numbness and tingling in her toes.  PMH: She also has a history of HL - started Lipitor, hypertension, depression, IBS, restless leg syndrome, osteopenia (DEXA scan in 2009 in 2011, on calcium and  vitamin D), chest pain with negative stress test 09/2009, fatty liver, and diverticulosis.  ROS: Constitutional: no weight gain/loss, + fatigue, no subjective hyperthermia/hypothermia Eyes: no blurry vision, no xerophthalmia ENT: no sore throat, no nodules palpated in throat, no dysphagia/odynophagia, no hoarseness Cardiovascular: no CP/SOB/palpitations/leg swelling Respiratory: +cough/no SOB Gastrointestinal: no N/V/D/C Musculoskeletal: no muscle/joint aches Skin: no rashes Neurological: no tremors/numbness/tingling/+ mild orthostatic dizziness (even before starting Invokana) Has a little burning with urination in last 2 days.  I reviewed pt's medications, allergies, PMH, social hx, family hx and no changes required.  PE: Pulse 86  Temp(Src) 97.6 F (36.4 C) (Oral)  Resp 12  Wt 216 lb (97.977 kg)  BMI 34.88 kg/m2  SpO2 96% Wt Readings from Last 3 Encounters:  08/20/12 216 lb (97.977 kg)  07/23/12 218 lb 12.8 oz (99.247 kg)  07/20/12 222 lb (100.699 kg)  Constitutional: overweight, in NAD Eyes: PERRLA, EOMI, no exophthalmos ENT: moist mucous membranes, no thyromegaly, no cervical lymphadenopathy Cardiovascular: RRR, No MRG Respiratory: CTA B Gastrointestinal: abdomen soft, NT, ND, BS+ Musculoskeletal: no deformities, strength intact in all 4 Skin: moist, warm, no rashes  ASSESSMENT: 1. DM2, insulin-dependent, uncontrolled, without complications  PLAN:  1. Patient with long standing, recently worsened, DM2, with the last hemoglobin A1c of 8.8,  increased from 8.1%. She is on a regimen of basal insulin 50 units + also Januvia, full dose metformin (despite advise to decrease the dose to half after her LFTs returned more elevated at the last visit, 2 months ago - she does not remember reading the my chart message that I sent her, however she did read it 2 days after I sent it) and we added Invokana 1 mo ago. She does not have chronic kidney disease, and she tolerated the metformin  well in the past, but had elevated LFTs in the context of fatty liver.  - We will check LFTs again today, after one more mo of full dose Metformin  - advised to decrease metformin to 1000 mg at night - advised her to continue Januvia 100 mg - continue Invokana, 300 mg daily ; she has some dizziness, not new - advised her to drink plenty of water. Will check a U/A since has mild burning with urination. - will check BUN/Cr, K, LFTs today - Return to clinic in 3 months with her sugar log  Office Visit on 08/20/2012  Component Date Value Range Status  . Sodium 08/20/2012 134* 135 - 145 mEq/L Final  . Potassium 08/20/2012 4.0  3.5 - 5.1 mEq/L Final  . Chloride 08/20/2012 101  96 - 112 mEq/L Final  . CO2 08/20/2012 25  19 - 32 mEq/L Final  . Glucose, Bld 08/20/2012 221* 70 - 99 mg/dL Final  . BUN 16/10/9602 11  6 - 23 mg/dL Final  . Creatinine, Ser 08/20/2012 0.8  0.4 - 1.2 mg/dL Final  . Total Bilirubin 08/20/2012 0.7  0.3 - 1.2 mg/dL Final  . Alkaline Phosphatase 08/20/2012 69  39 - 117 U/L Final  . AST 08/20/2012 70* 0 - 37 U/L Final  . ALT 08/20/2012 96* 0 - 35 U/L Final  . Total Protein 08/20/2012 8.2  6.0 - 8.3 g/dL Final  . Albumin 54/09/8117 4.1  3.5 - 5.2 g/dL Final  . Calcium 14/78/2956 9.3  8.4 - 10.5 mg/dL Final  . GFR 21/30/8657 72.77  >60.00 mL/min Final  . Color, Urine 08/20/2012 LT. YELLOW  Yellow;Lt. Yellow Final  . APPearance 08/20/2012 CLEAR  Clear Final  . Specific Gravity, Urine 08/20/2012 1.010  1.000-1.030 Final  . pH 08/20/2012 5.5  5.0 - 8.0 Final  . Total Protein, Urine 08/20/2012 NEGATIVE  Negative Final  . Urine Glucose 08/20/2012 >=1000  Negative Final  . Ketones, ur 08/20/2012 NEGATIVE  Negative Final  . Bilirubin Urine 08/20/2012 NEGATIVE  Negative Final  . Hgb urine dipstick 08/20/2012 NEGATIVE  Negative Final  . Urobilinogen, UA 08/20/2012 0.2  0.0 - 1.0 Final  . Leukocytes, UA 08/20/2012 NEGATIVE  Negative Final  . Nitrite 08/20/2012 NEGATIVE  Negative  Final  . HM Diabetic Eye Exam 07/20/2012 no DR reportedly   Final   No UTI. Glucosuria expected with Invokana. Unfortunately, LFTs increased with continuing metformin, I will advise the patient to come back in a month for repeat labs on the lower dose of metformin, and if still high, we will need to stop it.

## 2012-08-23 ENCOUNTER — Encounter: Payer: Self-pay | Admitting: Internal Medicine

## 2012-08-26 ENCOUNTER — Encounter: Payer: Self-pay | Admitting: Internal Medicine

## 2012-08-26 NOTE — Telephone Encounter (Signed)
Please advise on course of action.

## 2012-09-01 ENCOUNTER — Telehealth: Payer: Self-pay | Admitting: *Deleted

## 2012-09-01 ENCOUNTER — Encounter: Payer: Self-pay | Admitting: Internal Medicine

## 2012-09-01 ENCOUNTER — Encounter: Payer: Self-pay | Admitting: *Deleted

## 2012-09-02 NOTE — Telephone Encounter (Signed)
error 

## 2012-09-17 ENCOUNTER — Telehealth: Payer: Self-pay | Admitting: Internal Medicine

## 2012-09-17 ENCOUNTER — Other Ambulatory Visit: Payer: Self-pay | Admitting: Internal Medicine

## 2012-09-17 MED ORDER — CANAGLIFLOZIN 300 MG PO TABS: 1.0000 | ORAL_TABLET | Freq: Every day | ORAL | Status: DC

## 2012-10-27 ENCOUNTER — Ambulatory Visit (INDEPENDENT_AMBULATORY_CARE_PROVIDER_SITE_OTHER): Payer: Medicare Other | Admitting: Internal Medicine

## 2012-10-27 ENCOUNTER — Encounter: Payer: Self-pay | Admitting: Internal Medicine

## 2012-10-27 VITALS — BP 118/70 | HR 68 | Temp 97.6°F | Resp 12 | Wt 216.6 lb

## 2012-10-27 DIAGNOSIS — Z23 Encounter for immunization: Secondary | ICD-10-CM

## 2012-10-27 DIAGNOSIS — E119 Type 2 diabetes mellitus without complications: Secondary | ICD-10-CM

## 2012-10-27 LAB — HEPATIC FUNCTION PANEL
ALT: 59 U/L — ABNORMAL HIGH (ref 0–35)
AST: 54 U/L — ABNORMAL HIGH (ref 0–37)
Albumin: 4.3 g/dL (ref 3.5–5.2)
Alkaline Phosphatase: 72 U/L (ref 39–117)
Bilirubin, Direct: 0 mg/dL (ref 0.0–0.3)
Total Bilirubin: 0.9 mg/dL (ref 0.3–1.2)
Total Protein: 8.4 g/dL — ABNORMAL HIGH (ref 6.0–8.3)

## 2012-10-27 LAB — HEMOGLOBIN A1C: Hgb A1c MFr Bld: 8.9 % — ABNORMAL HIGH (ref 4.6–6.5)

## 2012-10-27 MED ORDER — GLIMEPIRIDE 2 MG PO TABS: 2.0000 mg | ORAL_TABLET | Freq: Every day | ORAL | Status: DC

## 2012-10-27 NOTE — Patient Instructions (Signed)
-   continue Levemir 50 units at night - continue metformin 1000 mg at night - continue Januvia 100 mg - continue Invokana, 300 mg daily - add 2 mg Amaryl before dinner  Please stop at the lab.

## 2012-10-27 NOTE — Progress Notes (Signed)
Patient ID: [name], female   DOB: 05/16/1945, 67 y.o.   MRN: [id number]  HPI: [name] is a 67 y.o.-year-old female, returning for f/u for DM2, dx 2006, insulin-dependent, uncontrolled, without complications. Last visit 2 mo ago.  3 weeks ago, she developed a fungal UTI >> tx by ObGyn >> sxs resolved.   Last hemoglobin A1c was: Lab Results  Component Value Date   HGBA1C 8.8* 07/20/2012  Increased from 8.1%.  Pt is on a regimen of: - Januvia 100 - Levemir 50 units qhs - pens We had to decrease the dose to 1000 mg at night, since her LFTs increased. - added Invokana 100 >> 300 Since she had transaminitis in the past 2/2 fatty liver. She was on Metformin in the past but she was taken off for the above reason. We stopped Amaryl 4 bid when started Januvia.  Pt checks her sugars now (1-2 x a day) - does bring a log: - am: 150-160 >> 119-165 in last 2 weeks >> 130-150 >> 140-180 - before lunch: 129, one check >> 110 >> not checking - before dinner: 149 and 183 >> not checking - bedtime not checked lately, before: 200s >> 137 and 220 >> 190-217 No lows; she has hypoglycemia awareness at 70.   She is not walking anymore on the treadmill. She will go to [employer].  Pt does not have chronic kidney disease, last BUN/creatinine was:  Lab Results  Component Value Date   BUN 11 08/20/2012   CREATININE 0.8 08/20/2012  She is on Losartan.  Last set of lipids reviewed: Lab Results  Component Value Date   CHOL 138 04/14/2012   HDL 39.40 04/14/2012   LDLCALC 73 04/14/2012   LDLDIRECT 134.7 12/06/2010   TRIG 128.0 04/14/2012   CHOLHDL 4 04/14/2012  She is on Lipitor 20.  Pt's last eye exam was ~3 mo ago >> no DR. No DR.  Occasionally numbness and tingling in her toes.  PMH: She also has a history of HL - started Lipitor, hypertension, depression, IBS, restless leg syndrome, osteopenia (DEXA scan in 2009 in 2011, on calcium and vitamin D), chest pain with negative stress test  09/2009, fatty liver, and diverticulosis.  ROS: Constitutional: no weight gain/loss, no fatigue, no subjective hyperthermia/hypothermia Eyes: no blurry vision, no xerophthalmia ENT: no sore throat, no nodules palpated in throat, no dysphagia/odynophagia, no hoarseness Cardiovascular: no CP/SOB/palpitations/leg swelling Respiratory: no cough/no SOB Gastrointestinal: no N/V/D/C Musculoskeletal: no muscle/joint aches Skin: no rashes Neurological: no tremors/numbness/tingling/+ mild orthostatic dizziness (even before starting Invokana)  I reviewed pt's medications, allergies, PMH, social hx, family hx and no changes required.  PE: BP 118/70  Pulse 68  Temp(Src) 97.6 F (36.4 C) (Oral)  Resp 12  Wt 216 lb 9.6 oz (98.249 kg)  BMI 34.98 kg/m2  SpO2 97% Wt Readings from Last 3 Encounters:  10/27/12 216 lb 9.6 oz (98.249 kg)  08/20/12 216 lb (97.977 kg)  07/23/12 218 lb 12.8 oz (99.247 kg)  Constitutional: overweight, in NAD Eyes: PERRLA, EOMI, no exophthalmos ENT: moist mucous membranes, no thyromegaly, no cervical lymphadenopathy Cardiovascular: RRR, No MRG Respiratory: CTA B Gastrointestinal: abdomen soft, NT, ND, BS+ Musculoskeletal: no deformities, strength intact in all 4 Skin: moist, warm, no rashes  ASSESSMENT: 1. DM2, insulin-dependent, uncontrolled, without complications  PLAN:  1. Patient with long standing, recently worsened, DM2, with the last hemoglobin A1c of 8.8, increased from 8.1%. She is on a regimen of basal insulin 50 units + also  Januvia, metformin and Invokana.  - continue Levemir 50 units at night - continue metformin 1000 mg at night - continue Januvia 100 mg - continue Invokana, 300 mg daily ; she continues to have some dizziness, not new - advised her to drink plenty of water.  - since high sugars at night, after dinner >> add 2 mg Amaryl before dinner - will give flu vaccine today - check Hba1c and LFTs - had increased LFTs - and we decreased  Metformin dose - Return to clinic in 3 months with her sugar log  Office Visit on 10/27/2012  Component Date Value Range Status  . Hemoglobin A1C 10/27/2012 8.9* 4.6 - 6.5 % Final   Glycemic Control Guidelines for People with Diabetes:Non Diabetic:  <6%Goal of Therapy: <7%Additional Action Suggested:  >8%   . Total Bilirubin 10/27/2012 0.9  0.3 - 1.2 mg/dL Final  . Bilirubin, Direct 10/27/2012 0.0  0.0 - 0.3 mg/dL Final  . Alkaline Phosphatase 10/27/2012 72  39 - 117 U/L Final  . AST 10/27/2012 54* 0 - 37 U/L Final  . ALT 10/27/2012 59* 0 - 35 U/L Final  . Total Protein 10/27/2012 8.4* 6.0 - 8.3 g/dL Final  . Albumin 96/04/5407 4.3  3.5 - 5.2 g/dL Final   LFTs improved >> continue current dose of Metformin. HbA1C still high, if not better at next check, might need to start mealtime insulin.

## 2012-10-30 ENCOUNTER — Other Ambulatory Visit: Payer: Self-pay | Admitting: Internal Medicine

## 2012-11-02 NOTE — Telephone Encounter (Signed)
rx refilled per protocol. DJR  

## 2012-11-12 ENCOUNTER — Other Ambulatory Visit: Payer: Self-pay

## 2012-11-13 ENCOUNTER — Other Ambulatory Visit: Payer: Self-pay | Admitting: Internal Medicine

## 2012-11-13 NOTE — Telephone Encounter (Signed)
Atorvastatin refill sent to pharmacy 

## 2012-11-19 NOTE — Telephone Encounter (Signed)
Phone note completed ° °

## 2012-11-23 ENCOUNTER — Ambulatory Visit: Payer: Medicare Other | Admitting: Internal Medicine

## 2012-11-27 ENCOUNTER — Other Ambulatory Visit: Payer: Self-pay | Admitting: Internal Medicine

## 2012-11-27 ENCOUNTER — Telehealth: Payer: Self-pay

## 2012-11-27 DIAGNOSIS — I10 Essential (primary) hypertension: Secondary | ICD-10-CM

## 2012-11-27 MED ORDER — ATENOLOL 50 MG PO TABS: ORAL_TABLET | ORAL | Status: DC

## 2012-11-27 NOTE — Telephone Encounter (Signed)
Left message for call back  identifiable     

## 2012-11-27 NOTE — Telephone Encounter (Addendum)
Medication and allergies: updated and reviewed  90 day supply/mail order: will not be using [facility] any longer (removed from list) Local pharmacy: [facility] [address]   Immunizations due: Tdap   A/P:   No changes to FH or PSH Health Maintenance  Topic Date Due  . Foot Exam  02/17/1955  . Tetanus/tdap  02/17/1964  . Urine Microalbumin  02/01/2010  . Pneumococcal Polysaccharide Vaccine Age 67 And Over  02/16/2010  . Hemoglobin A1c  04/27/2013  . Mammogram  07/03/2013  . Ophthalmology Exam  08/06/2013  . Influenza Vaccine  08/07/2013  . Colonoscopy  04/19/2019  . Zostavax  Completed  Sees Endocrinology Sent #[facility]  To Discuss with Provider: Not at this time

## 2012-11-27 NOTE — Telephone Encounter (Signed)
Atenolol refilled per protocol  

## 2012-11-30 ENCOUNTER — Ambulatory Visit (INDEPENDENT_AMBULATORY_CARE_PROVIDER_SITE_OTHER): Payer: Medicare Other | Admitting: Internal Medicine

## 2012-11-30 ENCOUNTER — Encounter: Payer: Self-pay | Admitting: Internal Medicine

## 2012-11-30 VITALS — BP 131/80 | HR 92 | Temp 97.9°F | Ht 67.0 in | Wt 215.0 lb

## 2012-11-30 DIAGNOSIS — E785 Hyperlipidemia, unspecified: Secondary | ICD-10-CM

## 2012-11-30 DIAGNOSIS — I10 Essential (primary) hypertension: Secondary | ICD-10-CM

## 2012-11-30 DIAGNOSIS — Z23 Encounter for immunization: Secondary | ICD-10-CM

## 2012-11-30 DIAGNOSIS — Z0000 Encounter for general adult medical examination without abnormal findings: Secondary | ICD-10-CM

## 2012-11-30 DIAGNOSIS — M899 Disorder of bone, unspecified: Secondary | ICD-10-CM

## 2012-11-30 LAB — LIPID PANEL
Cholesterol: 145 mg/dL (ref 0–200)
HDL: 40.5 mg/dL (ref >39.00)
LDL Cholesterol: 68 mg/dL (ref 0–99)
Total CHOL/HDL Ratio: 4
Triglycerides: 182 mg/dL — ABNORMAL HIGH (ref 0.0–149.0)
VLDL: 36.4 mg/dL (ref 0.0–40.0)

## 2012-11-30 NOTE — Assessment & Plan Note (Addendum)
Vitamin D checked in the past and was normal Check a bone density test Not on  calcium and vitamin D supplements--- encouraged daily supplements

## 2012-11-30 NOTE — Patient Instructions (Signed)
Get your blood work before you leave  Next visit in 6 months  for a  follow up . No Fasting Please make an appointment    Fall Prevention and Home Safety Falls cause injuries and can affect all age groups. It is possible to use preventive measures to significantly decrease the likelihood of falls. There are many simple measures which can make your home safer and prevent falls. OUTDOORS  Repair cracks and edges of walkways and driveways.  Remove high doorway thresholds.  Trim shrubbery on the main path into your home.  Have good outside lighting.  Clear walkways of tools, rocks, debris, and clutter.  Check that handrails are not broken and are securely fastened. Both sides of steps should have handrails.  Have leaves, snow, and ice cleared regularly.  Use sand or salt on walkways during winter months.  In the garage, clean up grease or oil spills. BATHROOM  Install night lights.  Install grab bars by the toilet and in the tub and shower.  Use non-skid mats or decals in the tub or shower.  Place a plastic non-slip stool in the shower to sit on, if needed.  Keep floors dry and clean up all water on the floor immediately.  Remove soap buildup in the tub or shower on a regular basis.  Secure bath mats with non-slip, double-sided rug tape.  Remove throw rugs and tripping hazards from the floors. BEDROOMS  Install night lights.  Make sure a bedside light is easy to reach.  Do not use oversized bedding.  Keep a telephone by your bedside.  Have a firm chair with side arms to use for getting dressed.  Remove throw rugs and tripping hazards from the floor. KITCHEN  Keep handles on pots and pans turned toward the center of the stove. Use back burners when possible.  Clean up spills quickly and allow time for drying.  Avoid walking on wet floors.  Avoid hot utensils and knives.  Position shelves so they are not too high or low.  Place commonly used objects within  easy reach.  If necessary, use a sturdy step stool with a grab bar when reaching.  Keep electrical cables out of the way.  Do not use floor polish or wax that makes floors slippery. If you must use wax, use non-skid floor wax.  Remove throw rugs and tripping hazards from the floor. STAIRWAYS  Never leave objects on stairs.  Place handrails on both sides of stairways and use them. Fix any loose handrails. Make sure handrails on both sides of the stairways are as long as the stairs.  Check carpeting to make sure it is firmly attached along stairs. Make repairs to worn or loose carpet promptly.  Avoid placing throw rugs at the top or bottom of stairways, or properly secure the rug with carpet tape to prevent slippage. Get rid of throw rugs, if possible.  Have an electrician put in a light switch at the top and bottom of the stairs. OTHER FALL PREVENTION TIPS  Wear low-heel or rubber-soled shoes that are supportive and fit well. Wear closed toe shoes.  When using a stepladder, make sure it is fully opened and both spreaders are firmly locked. Do not climb a closed stepladder.  Add color or contrast paint or tape to grab bars and handrails in your home. Place contrasting color strips on first and last steps.  Learn and use mobility aids as needed. Install an electrical emergency response system.  Turn on lights  to avoid dark areas. Replace light bulbs that burn out immediately. Get light switches that glow.  Arrange furniture to create clear pathways. Keep furniture in the same place.  Firmly attach carpet with non-skid or double-sided tape.  Eliminate uneven floor surfaces.  Select a carpet pattern that does not visually hide the edge of steps.  Be aware of all pets. OTHER HOME SAFETY TIPS  Set the water temperature for 120 F (48.8 C).  Keep emergency numbers on or near the telephone.  Keep smoke detectors on every level of the home and near sleeping areas. Document  Released: 12/14/2001 Document Revised: 06/25/2011 Document Reviewed: 03/15/2011 [facility] Patient Information 2014 [place], [place].

## 2012-11-30 NOTE — Assessment & Plan Note (Signed)
Well-controlled, recent BMP okay

## 2012-11-30 NOTE — Progress Notes (Signed)
Subjective:    Patient ID: [name], female    DOB: 1945-05-15, 67 y.o.   MRN: [id number]  HPI HPI Here for Medicare AWV: 1. Risk factors based on Past M, S, F history: reviewed 2. Physical Activities: active, no routine     3. Depression/mood:  Sx well controlled, screening neg   4. Hearing: No problems noted or reported  5. ADL's:  Independent   6. Fall Risk: no recent falls ,  see instructions 7. home Safety: does feel safe at home   8. Height, weight, &visual acuity: see VS, vision well corrected, sees eye doctor 9. Counseling: provided 10. Labs ordered based on risk factors: if needed   11. Referral Coordination: if needed 12.  Care Plan, see assessment and plan   13.   Cognitive Assessment: Motor skills and cognition seem appropriate  In addition, today we discussed the following: Diabetes--under the care of endocrinology, good compliance with multiple meds HTN-- good medication compliance, no apparent side effects, ambulatory BPs within normal. History of GERD--good compliance with meds, good symptom control Osteopenia-- due for a dexa per chart review   Past Medical History  Diagnosis Date  . Diabetes mellitus   . Depression     sees Dr.Cotle  . Hyperlipemia   . Hypertension   . IBS (irritable bowel syndrome)     and dyspepsia  . RLS (restless legs syndrome)   . Adenomatous colon polyp 03/1988  . Osteopenia     dexa 06/2007 and 10/11 Rx CA vitamin d  . Chest pain      Sept 2011: stress test neg  . Diverticulosis   . Fatty liver     Increased LFTs, saw GI 06/2011, likely from fatty liver    Past Surgical History  Procedure Laterality Date  . Uterine fibroid embolization      90s  . Tonsillectomy     History   Social History  . Marital Status: Married    Spouse Name: Merton Border    Number of Children: 3  . Years of Education: N/A   Occupational History  . retired, was a Psychologist, counselling)     Charity fundraiser  .     Social History Main Topics  . Smoking  status: Former Smoker -- 2.00 packs/day for 10 years    Types: Cigarettes    Quit date: 11/26/1975  . Smokeless tobacco: Never Used     Comment: used to smoke 2 ppd  . Alcohol Use: Yes     Comment: rarely  . Drug Use: No  . Sexual Activity: Not on file   Other Topics Concern  . Not on file   Social History Narrative   2 g-child   Family History  Problem Relation Age of Onset  . Lung cancer Mother     smoker  . Ovarian cancer Paternal Aunt     ?  Marland Kitchen Breast cancer Neg Hx   . Diabetes Other     aunts-uncles   . Colon cancer Father     F dx in his 67s  . Heart attack Other     GM in her 60s  . Stroke Other     aunts-uncles     Review of Systems  No  CP, SOB, lower extremity edema Denies  nausea, vomiting diarrhea Denies  blood in the stools no dysphagia or odynophagia occ cough thinks d/t post nasal drip,  (-) wheezing, chest congestion No dysuria, gross hematuria, difficulty urinating   No anxiety,  depression      Objective:   Physical Exam BP 131/80  Pulse 92  Temp(Src) 97.9 F (36.6 C)  Ht 5\' 7"  (1.702 m)  Wt 215 lb (97.523 kg)  BMI 33.67 kg/m2  SpO2 97% General -- alert, well-developed, NAD.  Neck --no thyromegaly , normal carotid pulse Lungs -- normal respiratory effort, no intercostal retractions, no accessory muscle use, and normal breath sounds.  Heart-- normal rate, regular rhythm, no murmur.  Abdomen-- Not distended, good bowel sounds,soft, non-tender. Extremities-- no pretibial edema bilaterally  Neurologic--  alert & oriented X3. Speech normal, gait normal, strength normal in all extremities.  Psych-- Cognition and judgment appear intact. Cooperative with normal attention span and concentration. No anxious appearing , no depressed appearing.      Assessment & Plan:

## 2012-11-30 NOTE — Progress Notes (Signed)
Pre visit review using our clinic review tool, if applicable. No additional management support is needed unless otherwise documented below in the visit note. 

## 2012-11-30 NOTE — Assessment & Plan Note (Signed)
On Lipitor, LFTs   Stable, check a FLP

## 2012-11-30 NOTE — Assessment & Plan Note (Signed)
Td-- today  Pneumovax (06/09/2007), and today  shingles shot 2010 Had a Flu shot    saw gynecology recently d/t a yeats infection, next PAP per gyn last mammogram [id number], normal  + FH colon cancer ----> Colonoscopy: 04/18/2009--next 2016  Diet-exercise discussed

## 2012-12-09 ENCOUNTER — Encounter: Payer: Self-pay | Admitting: Internal Medicine

## 2012-12-09 ENCOUNTER — Ambulatory Visit (INDEPENDENT_AMBULATORY_CARE_PROVIDER_SITE_OTHER)
Admission: RE | Admit: 2012-12-09 | Discharge: 2012-12-09 | Disposition: A | Discharge status: Home / Self Care | Payer: Medicare Other | Source: Ambulatory Visit | Attending: Internal Medicine | Admitting: Internal Medicine

## 2012-12-09 ENCOUNTER — Ambulatory Visit (INDEPENDENT_AMBULATORY_CARE_PROVIDER_SITE_OTHER): Payer: Medicare Other | Admitting: Internal Medicine

## 2012-12-09 VITALS — BP 98/54 | HR 76 | Temp 98.3°F | Wt 214.0 lb

## 2012-12-09 DIAGNOSIS — R079 Chest pain, unspecified: Secondary | ICD-10-CM

## 2012-12-09 DIAGNOSIS — M899 Disorder of bone, unspecified: Secondary | ICD-10-CM

## 2012-12-09 DIAGNOSIS — J069 Acute upper respiratory infection, unspecified: Secondary | ICD-10-CM

## 2012-12-09 MED ORDER — AZELASTINE HCL 0.1 % NA SOLN: 2.0000 | Freq: Two times a day (BID) | NASAL | Status: DC

## 2012-12-09 MED ORDER — AZITHROMYCIN 250 MG PO TABS: ORAL_TABLET | ORAL | Status: DC

## 2012-12-09 NOTE — Patient Instructions (Addendum)
Rest, fluids , tylenol For cough, take Mucinex DM twice a day as needed  For congestion use astelin nasal spray in addition to your regular spray Take the antibiotic as prescribed  (zithrmax) if no better in 3-4 days  Call if no better by next week Call anytime if the symptoms are severe

## 2012-12-09 NOTE — Progress Notes (Signed)
   Subjective:    Patient ID: [name], female    DOB: Mar 30, 1945, 67 y.o.   MRN: [id number]  HPI Visit Symptoms started 2 days ago with nose congestion, feeling fatigued and quite weak. This morning symptoms "went to the chest", when asked about that she said she is having a heaviness or pressure in the anterior chest without radiation, sx  steady since the morning. BP was noted to be slightly low today, good compliance of medication, good po intake. Taking Mucinex OTC   Past Medical History  Diagnosis Date  . Diabetes mellitus   . Depression     sees Dr.Cotle  . Hyperlipemia   . Hypertension   . IBS (irritable bowel syndrome)     and dyspepsia  . RLS (restless legs syndrome)   . Adenomatous colon polyp 03/1988  . Osteopenia     dexa 06/2007 and 10/11 Rx CA vitamin d  . Chest pain      Sept 2011: stress test neg  . Diverticulosis   . Fatty liver     Increased LFTs, saw GI 06/2011, likely from fatty liver    Past Surgical History  Procedure Laterality Date  . Uterine fibroid embolization      90s  . Tonsillectomy     History  Substance Use Topics  . Smoking status: Former Smoker -- 2.00 packs/day for 10 years    Types: Cigarettes    Quit date: 11/26/1975  . Smokeless tobacco: Never Used     Comment: used to smoke 2 ppd  . Alcohol Use: Yes     Comment: rarely    Review of Systems No fever or chills Has a dry cough, very mild wheezing. No nausea, vomiting, diarrhea. Mild postnasal dripping. Slightly dizzy when she stands up, that is not a new occurrence, slightly worse in the last 2 days?    Objective:   Physical Exam BP 98/54  Pulse 76  Temp(Src) 98.3 F (36.8 C)  Wt 214 lb (97.07 kg)  SpO2 96% General -- alert, well-developed, NAD.  HEENT-- Not pale. TMs normal, throat symmetric, no redness or discharge. Face symmetric, sinuses not tender to palpation. Nose slt  congested. Lungs -- normal respiratory effort, no intercostal retractions, no accessory  muscle use, and normal breath sounds.  Heart-- normal rate, regular rhythm, no murmur.  Extremities-- no pretibial edema bilaterally  Neurologic--  alert & oriented X3. Speech normal, gait normal, strength normal in all extremities.  Psych-- Cognition and judgment appear intact. Cooperative with normal attention span and concentration. No anxious appearing , no depressed appearing.     Assessment & Plan:  URI--  see instructions . BPs are slightly low, she has occasional dizziness when stands but that's not a new symptom. Asked to check BP bid and call if BP > 110/70  CP--  I am somehow concerned about the chest pain as described above with, EKG without ischemic changes; in setting of a URI and with a benign EKG is very unlikely  the CP is a serious condition

## 2012-12-09 NOTE — Progress Notes (Signed)
Pre visit review using our clinic review tool, if applicable. No additional management support is needed unless otherwise documented below in the visit note. 

## 2012-12-14 ENCOUNTER — Other Ambulatory Visit: Payer: Self-pay | Admitting: Internal Medicine

## 2012-12-15 ENCOUNTER — Encounter: Payer: Self-pay | Admitting: Internal Medicine

## 2012-12-28 ENCOUNTER — Other Ambulatory Visit: Payer: Self-pay | Admitting: Internal Medicine

## 2012-12-30 ENCOUNTER — Other Ambulatory Visit: Payer: Self-pay | Admitting: Internal Medicine

## 2013-01-01 ENCOUNTER — Other Ambulatory Visit: Payer: Self-pay | Admitting: Internal Medicine

## 2013-01-05 ENCOUNTER — Other Ambulatory Visit: Payer: Self-pay | Admitting: Internal Medicine

## 2013-01-05 NOTE — Telephone Encounter (Signed)
Omeprazole refilled per protocol. JG//CMA 

## 2013-01-12 ENCOUNTER — Other Ambulatory Visit: Payer: Self-pay | Admitting: Internal Medicine

## 2013-01-19 ENCOUNTER — Telehealth: Payer: Self-pay | Admitting: *Deleted

## 2013-01-19 NOTE — Telephone Encounter (Signed)
Will need to stop Invokana >> I need to see her back to see what else we need to add, if anything. Please ask her to stop Invokana ASAP and stay off it for a week and I will see her with her log after 1 week.

## 2013-01-19 NOTE — Telephone Encounter (Signed)
Pt called stating that she has had repeated yeast infections (4 in a short period of time) and feels that she has another one starting. Pt wants to know if one of her diabetes medications is causing this. [phone]) Please advise.

## 2013-01-20 ENCOUNTER — Other Ambulatory Visit: Payer: Self-pay | Admitting: Internal Medicine

## 2013-01-20 NOTE — Telephone Encounter (Signed)
Atenolol refilled per protocol. JG//CMA 

## 2013-01-20 NOTE — Telephone Encounter (Signed)
Called pt and advised her to stop Invokana per Dr Cruzita Lederer and to stay off it for a week and then come in with her sugar logs. Pt is scheduled for next Thurs, Jan 22nd. Be advised.

## 2013-01-28 ENCOUNTER — Encounter: Payer: Self-pay | Admitting: Internal Medicine

## 2013-01-28 ENCOUNTER — Ambulatory Visit (INDEPENDENT_AMBULATORY_CARE_PROVIDER_SITE_OTHER): Payer: Medicare Other | Admitting: Internal Medicine

## 2013-01-28 VITALS — BP 112/70 | HR 74 | Temp 97.5°F | Resp 12 | Wt 216.8 lb

## 2013-01-28 DIAGNOSIS — E119 Type 2 diabetes mellitus without complications: Secondary | ICD-10-CM

## 2013-01-28 LAB — HEMOGLOBIN A1C: Hgb A1c MFr Bld: 8.4 % — ABNORMAL HIGH (ref 4.6–6.5)

## 2013-01-28 MED ORDER — GLIMEPIRIDE 2 MG PO TABS: 4.0000 mg | ORAL_TABLET | Freq: Two times a day (BID) | ORAL | Status: DC

## 2013-01-28 NOTE — Patient Instructions (Signed)
Please make the following change in your diabetes regimen: - continue Januvia 100 mg in am - continue Levemir 50 units at bedtime - continue Metformin 1000 mg at dinnertime - increase Amaryl to 2 mg 2x a day for 3 days, then to 4 mg 2x a day Please return in 1 month with your sugar log.  Please stop at the lab.

## 2013-01-28 NOTE — Progress Notes (Signed)
Patient ID: [name], female   DOB: [birth year], 68 y.o.   MRN: [id number]  HPI: [name] is a 68 y.o.-year-old female, returning for f/u for DM2, dx 2006, insulin-dependent, uncontrolled, without complications. Last visit 3 mo ago.  Last hemoglobin A1c was: Lab Results  Component Value Date   HGBA1C 8.9* 10/27/2012   HGBA1C 8.8* 07/20/2012   HGBA1C 8.1* 04/20/2012   Pt is on a regimen of: - Januvia 100 mg - Levemir 50 units qhs - pens - Metformin 1000 mg at night - we had to decrease the dose to 1000 mg at night, since her LFTs increased. - 2 g Amaryl before dinner >> added at last visit We tried Invokana 300 >> she developed 3 fungal UTIs >> tx by ObGyn >> stopped Invokana 01/20/2013 She was on Metformin in the past but she was taken off for the above reason. She had transaminitis in the past 2/2 fatty liver.  We stopped Amaryl 4 bid when started Januvia.  Pt checks her sugars now (1-2 x a day) - does bring a log: - am: 150-160 >> 119-165 in last 2 weeks >> 130-150 >> 140-180 >> 129-152 - before lunch: 129, one check >> 110 >> not checking >> n/c - before dinner: 149 and 183 >> not checking >> 148, 283 - bedtime not checked lately, before: 200s >> 137 and 220 >> 190-217 >> 174, 223, 320 No lows; she has hypoglycemia awareness at 70.   She is not walking anymore on the treadmill. She did not go to Silver sneakers yet, but plans to do so soon.  Pt does not have chronic kidney disease, last BUN/creatinine was:  Lab Results  Component Value Date   BUN 11 08/20/2012   CREATININE 0.8 08/20/2012  She is on Losartan.  Last set of lipids reviewed: Lab Results  Component Value Date   CHOL 145 11/30/2012   HDL 40.50 11/30/2012   LDLCALC 68 11/30/2012   LDLDIRECT 134.7 12/06/2010   TRIG 182.0* 11/30/2012   CHOLHDL 4 11/30/2012  She is on Lipitor 20.  Pt's last eye exam was ~6 mo ago >> no DR.  Occasionally numbness and tingling in her toes.  PMH: She also has a  history of HL - started Lipitor, hypertension, depression, IBS, restless leg syndrome, osteopenia (DEXA scan in 2009 in 2011, on calcium and vitamin D), chest pain with negative stress test 09/2009, fatty liver, and diverticulosis.  ROS: Constitutional: no weight gain/loss, no fatigue, no subjective hyperthermia/hypothermia Eyes: no blurry vision, no xerophthalmia ENT: no sore throat, no nodules palpated in throat, no dysphagia/odynophagia, no hoarseness Cardiovascular: no CP/SOB/palpitations/leg swelling Respiratory: no cough/no SOB Gastrointestinal: no N/V/D/C Musculoskeletal: no muscle/joint aches Skin: no rashes Neurological: no tremors/numbness/tingling/+ mild orthostatic dizziness (even before starting Invokana)  I reviewed pt's medications, allergies, PMH, social hx, family hx and no changes required.  PE: BP 112/70  Pulse 74  Temp(Src) 97.5 F (36.4 C) (Oral)  Resp 12  Wt 216 lb 12.8 oz (98.34 kg)  SpO2 98% Wt Readings from Last 3 Encounters:  01/28/13 216 lb 12.8 oz (98.34 kg)  12/09/12 214 lb (97.07 kg)  11/30/12 215 lb (97.523 kg)  Constitutional: overweight, in NAD Eyes: PERRLA, EOMI, no exophthalmos ENT: moist mucous membranes, no thyromegaly, no cervical lymphadenopathy Cardiovascular: RRR, No MRG Respiratory: CTA B Gastrointestinal: abdomen soft, NT, ND, BS+ Musculoskeletal: no deformities, strength intact in all 4 Skin: moist, warm, no rashes  ASSESSMENT: 1. DM2, insulin-dependent, uncontrolled, without complications  PLAN:  1. Patient with long standing, recently worsened, DM2, with the last hemoglobin A1c of 8.8. She could not tolerate Invokana.  She is on a regimen of basal insulin 50 units + also Januvia, metformin and low dose Amaryl at dinnertime. Sugars higher at bedtime.  - I advised her to: Patient Instructions  Please make the following change in your diabetes regimen: - continue Januvia 100 mg in am - continue Levemir 50 units at bedtime -  continue Metformin 1000 mg at dinnertime - increase Amaryl to 2 mg 2x a day for 3 days, then to 4 mg 2x a day Please return in 1 month with your sugar log.  Please stop at the lab. - given more logs and advised to check at # times of the day - given flu vaccine at last visit - check Hba1c today - Return to clinic in 1 month with her sugar log  Office Visit on 01/28/2013  Component Date Value Range Status  . Hemoglobin A1C 01/28/2013 8.4* 4.6 - 6.5 % Final   Glycemic Control Guidelines for People with Diabetes:Non Diabetic:  <6%Goal of Therapy: <7%Additional Action Suggested:  >8%    HbA1c a little improved.

## 2013-02-01 ENCOUNTER — Ambulatory Visit (INDEPENDENT_AMBULATORY_CARE_PROVIDER_SITE_OTHER): Payer: Medicare Other | Admitting: Internal Medicine

## 2013-02-01 ENCOUNTER — Encounter: Payer: Self-pay | Admitting: Internal Medicine

## 2013-02-01 VITALS — BP 122/80 | HR 90 | Temp 98.4°F | Wt 215.0 lb

## 2013-02-01 DIAGNOSIS — J209 Acute bronchitis, unspecified: Secondary | ICD-10-CM

## 2013-02-01 DIAGNOSIS — J069 Acute upper respiratory infection, unspecified: Secondary | ICD-10-CM

## 2013-02-01 MED ORDER — AZITHROMYCIN 250 MG PO TABS: ORAL_TABLET | ORAL | Status: DC

## 2013-02-01 MED ORDER — AZELASTINE HCL 0.1 % NA SOLN: 2.0000 | Freq: Two times a day (BID) | NASAL | Status: DC

## 2013-02-01 MED ORDER — FLUTICASONE PROPIONATE 50 MCG/ACT NA SUSP: 2.0000 | Freq: Every day | NASAL | Status: DC | PRN

## 2013-02-01 MED ORDER — HYDROCODONE-HOMATROPINE 5-1.5 MG/5ML PO SYRP: 5.0000 mL | ORAL_SOLUTION | Freq: Every evening | ORAL | Status: DC | PRN

## 2013-02-01 NOTE — Progress Notes (Signed)
   Subjective:    Patient ID: [name], female    DOB: Apr 28, 1945, 68 y.o.   MRN: [id number]  HPI Acute visit, Symptoms started 4 days ago with cough, occasional production of beige sputum, no hemoptysis. + Low-grade fever. Last night had a hard time sleeping due to cough  Past Medical History  Diagnosis Date  . Diabetes mellitus   . Depression     sees Dr.Cotle  . Hyperlipemia   . Hypertension   . IBS (irritable bowel syndrome)     and dyspepsia  . RLS (restless legs syndrome)   . Adenomatous colon polyp 03/1988  . Osteopenia     dexa 06/2007 and 10/11 Rx CA vitamin d  . Chest pain      Sept 2011: stress test neg  . Diverticulosis   . Fatty liver     Increased LFTs, saw GI 06/2011, likely from fatty liver    Past Surgical History  Procedure Laterality Date  . Uterine fibroid embolization      90s  . Tonsillectomy     History  Substance Use Topics  . Smoking status: Former Smoker -- 2.00 packs/day for 10 years    Types: Cigarettes    Quit date: 11/26/1975  . Smokeless tobacco: Never Used     Comment: used to smoke 2 ppd  . Alcohol Use: Yes     Comment: rarely     Review of Systems Denies chest pain or difficulty breathing No nausea, vomiting, diarrhea. mild myalgias. Sinuses slightly congested for the last 2 days, occasionally blowing nasal discharge    Objective:   Physical Exam BP 122/80  Pulse 90  Temp(Src) 98.4 F (36.9 C)  Wt 215 lb (97.523 kg)  SpO2 97% General -- alert, well-developed, NAD.  HEENT-- Not pale. TMs normal, throat symmetric, no redness or discharge. Face symmetric, sinuses not tender to palpation. Nose slt  congested.  Lungs -- normal respiratory effort, no intercostal retractions, no accessory muscle use, and normal breath sounds.  Heart-- normal rate, regular rhythm, no murmur.   Neurologic--  alert & oriented X3. Speech normal, gait normal, strength normal in all extremities.  Psych-- Cognition and judgment appear intact.  Cooperative with normal attention span and concentration. No anxious or depressed appearing.     Assessment & Plan:  Bronchitis , Symptoms consistent with early bronchitis, see instructions. (Was prescribed Zithromax last month, she never needed it)

## 2013-02-01 NOTE — Patient Instructions (Signed)
For cough, take Mucinex DM twice a day as needed  If the cough is severe at night use hydrocodone, watch for somnolence  For congestion use flonase -astelin as prescribed until you feel better   Take the antibiotic as prescribed  (zithromax) if no better in 2-3 days  Call if no better in few days Call anytime if the symptoms are severe

## 2013-02-01 NOTE — Progress Notes (Signed)
Pre visit review using our clinic review tool, if applicable. No additional management support is needed unless otherwise documented below in the visit note. 

## 2013-02-04 ENCOUNTER — Other Ambulatory Visit: Payer: Self-pay | Admitting: Internal Medicine

## 2013-02-12 ENCOUNTER — Other Ambulatory Visit: Payer: Self-pay | Admitting: Internal Medicine

## 2013-02-18 ENCOUNTER — Other Ambulatory Visit: Payer: Self-pay | Admitting: Internal Medicine

## 2013-02-19 ENCOUNTER — Encounter: Payer: Self-pay | Admitting: Internal Medicine

## 2013-02-19 ENCOUNTER — Ambulatory Visit (INDEPENDENT_AMBULATORY_CARE_PROVIDER_SITE_OTHER): Payer: Medicare Other | Admitting: Internal Medicine

## 2013-02-19 VITALS — BP 121/78 | HR 81 | Temp 97.9°F | Wt 218.0 lb

## 2013-02-19 DIAGNOSIS — R413 Other amnesia: Secondary | ICD-10-CM

## 2013-02-19 LAB — VITAMIN B12: Vitamin B-12: 197 pg/mL — ABNORMAL LOW (ref 211–911)

## 2013-02-19 LAB — FOLATE: Folate: 14.3 ng/mL (ref >5.9)

## 2013-02-19 NOTE — Assessment & Plan Note (Addendum)
Mild memory deficit per daughter's report, gradually getting slightly worse. MMSE 05-2011 was 30, today 28 to 29. Depression well controlled , she is completely functional . Denies hypoglycemia events. She does take a number of psych medications that could affect her memory. In summary,some memory deficits by family reports, MMSE essentially at baseline. We agreed to check labs today, I also encouraged her to stay mentally and physically active. We discussed possibly a neurology referral for  Medications. Neurology could do a more detailed neurological exam and possibly   be more aggressive and try Aricept or Namenda but she already takes a number of medications. The daughter wonders about ADHD as a cause for forgetfulness, even if that is the case, she is retired and very functional, I don't believe adding a ADHD medication to her regimen is in her best benefit. We agreed to reassess her memory at least yearly.

## 2013-02-19 NOTE — Patient Instructions (Addendum)
Get your blood work before you leave   Calcium, vitamin D, one multivitamin daily  Next visit is for routine check up    in 3 months  No need to come back fasting Please make an appointment

## 2013-02-19 NOTE — Progress Notes (Signed)
Pre visit review using our clinic review tool, if applicable. No additional management support is needed unless otherwise documented below in the visit note. 

## 2013-02-19 NOTE — Progress Notes (Signed)
   Subjective:    Patient ID: [name], female    DOB: Aug 21, 1945, 68 y.o.   MRN: [id number]  DOS:  02/19/2013 Acute visit, Here with her daughter [name] who is concerned about some memory loss; reports that she sometimes forgets conversations, repeat the same questions many times. Denies any angry outbursts, has not wandered or gotten lost  . Meds list review it and updated. She has a sedentary lifestyle and does not take vitamins or calcium supplementation    Past Medical History  Diagnosis Date  . Diabetes mellitus   . Depression     sees Dr.Cotle  . Hyperlipemia   . Hypertension   . IBS (irritable bowel syndrome)     and dyspepsia  . RLS (restless legs syndrome)   . Adenomatous colon polyp 03/1988  . Osteopenia     dexa 06/2007 and 10/11 Rx CA vitamin d  . Chest pain      Sept 2011: stress test neg  . Diverticulosis   . Fatty liver     Increased LFTs, saw GI 06/2011, likely from fatty liver     Past Surgical History  Procedure Laterality Date  . Uterine fibroid embolization      90s  . Tonsillectomy      History   Social History  . Marital Status: Married    Spouse Name: [name]    Number of Children: 3  . Years of Education: N/A   Occupational History  . retired, was a Leisure centre manager)     Therapist, sports  .     Social History Main Topics  . Smoking status: Former Smoker -- 2.00 packs/day for 10 years    Types: Cigarettes    Quit date: 11/26/1975  . Smokeless tobacco: Never Used     Comment: used to smoke 2 ppd  . Alcohol Use: Yes     Comment: rarely  . Drug Use: No  . Sexual Activity: Not on file   Other Topics Concern  . Not on file   Social History Narrative   2 g-child    ROS      Objective:   Physical Exam BP 121/78  Pulse 81  Temp(Src) 97.9 F (36.6 C)  Wt 218 lb (98.884 kg)  SpO2 98% General -- alert, well-developed, NAD.   Neurologic--  MMSE 28 to 29 (i didn't ask her to write) Psych-- Cognition and judgment appear intact.  Cooperative with normal attention span and concentration. No anxious or depressed appearing.      Assessment & Plan:   Today , I spent more than 30 min with the patient, >50% of the time counseling

## 2013-02-20 LAB — VITAMIN D 25 HYDROXY (VIT D DEFICIENCY, FRACTURES): Vit D, 25-Hydroxy: 44 ng/mL (ref 30–89)

## 2013-02-21 ENCOUNTER — Other Ambulatory Visit: Payer: Self-pay | Admitting: Internal Medicine

## 2013-02-25 ENCOUNTER — Ambulatory Visit (INDEPENDENT_AMBULATORY_CARE_PROVIDER_SITE_OTHER): Payer: Medicare Other

## 2013-02-25 DIAGNOSIS — E538 Deficiency of other specified B group vitamins: Secondary | ICD-10-CM

## 2013-02-25 MED ORDER — CYANOCOBALAMIN 1000 MCG/ML IJ SOLN
1000.0000 ug | Freq: Once | INTRAMUSCULAR | Status: AC
  Administered 2013-02-25: 1000 ug via INTRAMUSCULAR

## 2013-03-01 ENCOUNTER — Encounter: Payer: Self-pay | Admitting: Internal Medicine

## 2013-03-01 ENCOUNTER — Ambulatory Visit (INDEPENDENT_AMBULATORY_CARE_PROVIDER_SITE_OTHER): Payer: Medicare Other | Admitting: Internal Medicine

## 2013-03-01 VITALS — BP 116/76 | HR 84 | Temp 97.5°F | Resp 12 | Wt 219.0 lb

## 2013-03-01 DIAGNOSIS — E119 Type 2 diabetes mellitus without complications: Secondary | ICD-10-CM

## 2013-03-01 NOTE — Patient Instructions (Signed)
Please start the low fat diet!  Please come back for a follow-up appointment in 3 months with your sugar log.

## 2013-03-01 NOTE — Progress Notes (Signed)
Patient ID: [name], female   DOB: [birth year], 68 y.o.   MRN: [id number]  HPI: [name] is a 68 y.o.-year-old female, returning for f/u for DM2, dx 2006, insulin-dependent, uncontrolled, without complications. Last visit 1 mo ago.  Last hemoglobin A1c was: Lab Results  Component Value Date   HGBA1C 8.4* 01/28/2013   HGBA1C 8.9* 10/27/2012   HGBA1C 8.8* 07/20/2012   Pt is on a regimen of: - Januvia 100 mg - Levemir 50 units qhs - pens - Metformin 1000 mg at night - we had to decrease the dose to 1000 mg at night, since her LFTs increased. - 4 mg bid Amaryl (<< 2 mg daily) We tried Invokana 300 >> she developed 3 fungal UTIs >> tx by ObGyn >> stopped Invokana 01/20/2013 She had transaminitis in the past 2/2 fatty liver.  We stopped Amaryl 4 bid in the past when started Januvia.  Pt checks her sugars now (2-3 x a day) - does bring a log: - am: 150-160 >> 119-165 in last 2 weeks >> 130-150 >> 140-180 >> 129-152 >> 119-194 - before lunch: 129, one check >> 110 >> not checking >> n/c  >> 142-175 (256 x1) - before dinner: 149 and 183 >> not checking >> 148, 283 >> 151-258  - bedtime not checked lately, before: 200s >> 137 and 220 >> 190-217 >> 174, 223, 320 >> 151-221 No lows; she has hypoglycemia awareness at 70.   Started Silver sneakers 2 weeks ago.   Pt does not have chronic kidney disease, last BUN/creatinine was:  Lab Results  Component Value Date   BUN 11 08/20/2012   CREATININE 0.8 08/20/2012  She is on Losartan.  Last set of lipids reviewed: Lab Results  Component Value Date   CHOL 145 11/30/2012   HDL 40.50 11/30/2012   LDLCALC 68 11/30/2012   LDLDIRECT 134.7 12/06/2010   TRIG 182.0* 11/30/2012   CHOLHDL 4 11/30/2012  She is on Lipitor 20.  Pt's last eye exam was ~7 mo ago >> no DR.  Occasionally numbness and tingling in her toes.  PMH: She also has a history of HL - started Lipitor, hypertension, depression, IBS, restless leg syndrome, osteopenia (DEXA  scan in 2009 in 2011, on calcium and vitamin D), chest pain with negative stress test 09/2009, fatty liver, and diverticulosis.  ROS: Constitutional: no weight gain/loss, no fatigue, no subjective hyperthermia/hypothermia Eyes: no blurry vision, no xerophthalmia ENT: no sore throat, no nodules palpated in throat, no dysphagia/odynophagia, no hoarseness Cardiovascular: no CP/SOB/palpitations/leg swelling Respiratory: no cough/no SOB Gastrointestinal: no N/V/D/C Musculoskeletal: no muscle/joint aches Skin: no rashes Neurological: no tremors/numbness/tingling/+ mild orthostatic dizziness (even before starting Invokana)  I reviewed pt's medications, allergies, PMH, social hx, family hx and no changes required. Except she started B12 inj for low B12 vit.  PE: BP 116/76  Pulse 84  Temp(Src) 97.5 F (36.4 C) (Oral)  Resp 12  Wt 219 lb (99.338 kg)  SpO2 97% Wt Readings from Last 3 Encounters:  03/01/13 219 lb (99.338 kg)  02/19/13 218 lb (98.884 kg)  02/01/13 215 lb (97.523 kg)  Constitutional: overweight, in NAD Eyes: PERRLA, EOMI, no exophthalmos ENT: moist mucous membranes, no thyromegaly, no cervical lymphadenopathy Cardiovascular: RRR, No MRG Respiratory: CTA B Gastrointestinal: abdomen soft, NT, ND, BS+ Musculoskeletal: no deformities, strength intact in all 4 Skin: moist, warm, no rashes  ASSESSMENT: 1. DM2, insulin-dependent, uncontrolled, without complications - started D78 inj >> feels better  PLAN:  1. Patient with long  standing, uncontrolled, DM2, with the last hemoglobin A1c of 8.4. She could not tolerate Invokana.  She is on a regimen of basal insulin 50 units + also Januvia, metformin and Amaryl. Her sugars have not improved since last visit. Sugars higher at bedtime. We had a long discussion about ways to improve her sugars and she tells me that her husband is on a low fat diet and he lost 100 lbs in the last 6-7 months. He uses [employer]  App to calculate his fat  and calorie intake.She would like to try his diet.  - I advised her to: - continue Januvia 100 mg in am - continue Levemir 50 units at bedtime - continue Metformin 1000 mg at dinnertime - continue Amaryl 4 mg 2x a day - Start the low fat diet - given flu vaccine this season - check Hba1c at next visit - Return to clinic in 3 months with her sugar log

## 2013-03-04 ENCOUNTER — Ambulatory Visit: Payer: Medicare Other

## 2013-03-05 ENCOUNTER — Ambulatory Visit (INDEPENDENT_AMBULATORY_CARE_PROVIDER_SITE_OTHER): Payer: Medicare Other | Admitting: *Deleted

## 2013-03-05 DIAGNOSIS — E538 Deficiency of other specified B group vitamins: Secondary | ICD-10-CM

## 2013-03-05 MED ORDER — CYANOCOBALAMIN 1000 MCG/ML IJ SOLN
1000.0000 ug | Freq: Once | INTRAMUSCULAR | Status: AC
  Administered 2013-03-05: 1000 ug via INTRAMUSCULAR

## 2013-03-12 ENCOUNTER — Ambulatory Visit (INDEPENDENT_AMBULATORY_CARE_PROVIDER_SITE_OTHER): Payer: Medicare Other

## 2013-03-12 DIAGNOSIS — E538 Deficiency of other specified B group vitamins: Secondary | ICD-10-CM

## 2013-03-12 MED ORDER — CYANOCOBALAMIN 1000 MCG/ML IJ SOLN
1000.0000 ug | Freq: Once | INTRAMUSCULAR | Status: AC
  Administered 2013-03-12: 1000 ug via INTRAMUSCULAR

## 2013-03-19 ENCOUNTER — Ambulatory Visit: Payer: Medicare Other

## 2013-03-19 ENCOUNTER — Ambulatory Visit (INDEPENDENT_AMBULATORY_CARE_PROVIDER_SITE_OTHER): Payer: Medicare Other

## 2013-03-19 DIAGNOSIS — E538 Deficiency of other specified B group vitamins: Secondary | ICD-10-CM

## 2013-03-19 MED ORDER — CYANOCOBALAMIN 1000 MCG/ML IJ SOLN
1000.0000 ug | Freq: Once | INTRAMUSCULAR | Status: AC
  Administered 2013-03-19: 1000 ug via INTRAMUSCULAR

## 2013-04-02 ENCOUNTER — Other Ambulatory Visit: Payer: Self-pay | Admitting: *Deleted

## 2013-04-02 ENCOUNTER — Other Ambulatory Visit: Payer: Self-pay | Admitting: Internal Medicine

## 2013-04-02 MED ORDER — GLIMEPIRIDE 2 MG PO TABS: 4.0000 mg | ORAL_TABLET | Freq: Two times a day (BID) | ORAL | Status: DC

## 2013-04-09 ENCOUNTER — Other Ambulatory Visit: Payer: Self-pay | Admitting: Internal Medicine

## 2013-04-19 ENCOUNTER — Ambulatory Visit (INDEPENDENT_AMBULATORY_CARE_PROVIDER_SITE_OTHER): Payer: Medicare Other

## 2013-04-19 DIAGNOSIS — E538 Deficiency of other specified B group vitamins: Secondary | ICD-10-CM

## 2013-04-19 MED ORDER — CYANOCOBALAMIN 1000 MCG/ML IJ SOLN
1000.0000 ug | Freq: Once | INTRAMUSCULAR | Status: AC
  Administered 2013-04-19: 1000 ug via INTRAMUSCULAR

## 2013-04-28 ENCOUNTER — Other Ambulatory Visit: Payer: Self-pay | Admitting: Internal Medicine

## 2013-05-12 LAB — HM DEXA SCAN

## 2013-05-12 LAB — HM MAMMOGRAPHY: HM Mammogram: NEGATIVE

## 2013-05-15 ENCOUNTER — Other Ambulatory Visit: Payer: Self-pay | Admitting: Internal Medicine

## 2013-05-17 NOTE — Telephone Encounter (Signed)
Atorvastatin refilled per protocol. JG//CMA 

## 2013-05-19 ENCOUNTER — Ambulatory Visit: Payer: Medicare Other | Admitting: Internal Medicine

## 2013-05-19 ENCOUNTER — Ambulatory Visit: Payer: Medicare Other

## 2013-05-20 ENCOUNTER — Ambulatory Visit (INDEPENDENT_AMBULATORY_CARE_PROVIDER_SITE_OTHER): Payer: Medicare Other | Admitting: Internal Medicine

## 2013-05-20 ENCOUNTER — Encounter: Payer: Self-pay | Admitting: Internal Medicine

## 2013-05-20 VITALS — BP 118/75 | HR 72 | Temp 98.2°F | Wt 221.0 lb

## 2013-05-20 DIAGNOSIS — F3289 Other specified depressive episodes: Secondary | ICD-10-CM

## 2013-05-20 DIAGNOSIS — I10 Essential (primary) hypertension: Secondary | ICD-10-CM

## 2013-05-20 DIAGNOSIS — Z7189 Other specified counseling: Secondary | ICD-10-CM

## 2013-05-20 DIAGNOSIS — Z7184 Encounter for health counseling related to travel: Secondary | ICD-10-CM | POA: Insufficient documentation

## 2013-05-20 DIAGNOSIS — F329 Major depressive disorder, single episode, unspecified: Secondary | ICD-10-CM

## 2013-05-20 DIAGNOSIS — E538 Deficiency of other specified B group vitamins: Secondary | ICD-10-CM

## 2013-05-20 LAB — COMPREHENSIVE METABOLIC PANEL
ALT: 38 U/L — ABNORMAL HIGH (ref 0–35)
AST: 36 U/L (ref 0–37)
Albumin: 3.7 g/dL (ref 3.5–5.2)
Alkaline Phosphatase: 65 U/L (ref 39–117)
BUN: 12 mg/dL (ref 6–23)
CO2: 25 mEq/L (ref 19–32)
Calcium: 9.1 mg/dL (ref 8.4–10.5)
Chloride: 101 mEq/L (ref 96–112)
Creatinine, Ser: 0.8 mg/dL (ref 0.4–1.2)
GFR: 73.63 mL/min (ref >60.00)
Glucose, Bld: 242 mg/dL — ABNORMAL HIGH (ref 70–99)
Potassium: 3.9 mEq/L (ref 3.5–5.1)
Sodium: 134 mEq/L — ABNORMAL LOW (ref 135–145)
Total Bilirubin: 0.5 mg/dL (ref 0.2–1.2)
Total Protein: 7.4 g/dL (ref 6.0–8.3)

## 2013-05-20 LAB — VITAMIN B12: Vitamin B-12: >1500 pg/mL — ABNORMAL HIGH (ref 211–911)

## 2013-05-20 LAB — FOLATE: Folate: 22.3 ng/mL (ref >5.9)

## 2013-05-20 MED ORDER — CYANOCOBALAMIN 1000 MCG/ML IJ SOLN
1000.0000 ug | Freq: Once | INTRAMUSCULAR | Status: AC
  Administered 2013-05-20: 1000 ug via INTRAMUSCULAR

## 2013-05-20 NOTE — Progress Notes (Signed)
Subjective:    Patient ID: [name], female    DOB: 02/15/1945, 68 y.o.   MRN: [id number]  DOS:  05/20/2013 Type of  visit: Routine office visit hypertension, good medication compliance, ambulatory BPs in the 110/70 range,  Since the last time she was here she is exercising more and has seen a positive impact on her BPs and blood sugars. B12 deficiency, needs any injection and labs. High cholesterol, good compliance of medication. Needs travel advice, going to [place] June 16, [place]. She wonders about what shots she needs although her teammates usually don't take extra immunizations   ROS Denies chest pain, difficulty breathing No nausea, vomiting, diarrhea  Past Medical History  Diagnosis Date  . Diabetes mellitus   . Depression     sees Dr.Cotle  . Hyperlipemia   . Hypertension   . IBS (irritable bowel syndrome)     and dyspepsia  . RLS (restless legs syndrome)   . Adenomatous colon polyp 03/1988  . Osteopenia     dexa 06/2007 and 10/11 Rx CA vitamin d  . Chest pain      Sept 2011: stress test neg  . Diverticulosis   . Fatty liver     Increased LFTs, saw GI 06/2011, likely from fatty liver     Past Surgical History  Procedure Laterality Date  . Uterine fibroid embolization      90s  . Tonsillectomy      History   Social History  . Marital Status: Married    Spouse Name: [name]    Number of Children: 3  . Years of Education: N/A   Occupational History  . retired, was a Leisure centre manager)     Therapist, sports  .     Social History Main Topics  . Smoking status: Former Smoker -- 2.00 packs/day for 10 years    Types: Cigarettes    Quit date: 11/26/1975  . Smokeless tobacco: Never Used     Comment: used to smoke 2 ppd  . Alcohol Use: Yes     Comment: rarely  . Drug Use: No  . Sexual Activity: Not on file   Other Topics Concern  . Not on file   Social History Narrative   2 g-child        Medication List       This  list is accurate as of: 05/20/13 10:48 AM.  Always use your most recent med list.               ABILIFY 5 MG tablet  Generic drug:  ARIPiprazole  Take 2.5 mg by mouth daily.     atenolol 50 MG tablet  Commonly known as:  TENORMIN  TAKE 1 TABLET (50 MG TOTAL) BY MOUTH DAILY.     atorvastatin 20 MG tablet  Commonly known as:  LIPITOR  TAKE 1 TABLET BY MOUTH ONCE DAILY     clonazePAM 0.5 MG tablet  Commonly known as:  KLONOPIN  Take by mouth. As needed     DULoxetine 60 MG capsule  Commonly known as:  CYMBALTA  Take 60 mg by mouth daily.     glimepiride 4 MG tablet  Commonly known as:  AMARYL  TAKE 1 TABLET (4 MG TOTAL) BY MOUTH 2 (TWO) TIMES DAILY BEFORE A MEAL.     glucose blood test strip  Commonly known as:  ONE TOUCH ULTRA TEST  Check twice a day     JANUVIA  100 MG tablet  Generic drug:  sitaGLIPtin  TAKE 1 TABLET (100 MG TOTAL) BY MOUTH DAILY.     LEVEMIR FLEXTOUCH 100 UNIT/ML Pen  Generic drug:  Insulin Detemir  INJECT 0.5ML INTO SKIN DAILY     losartan 50 MG tablet  Commonly known as:  COZAAR  TAKE 1 TABLET (50 MG TOTAL) BY MOUTH DAILY.     metFORMIN 500 MG tablet  Commonly known as:  GLUCOPHAGE  TAKE 2 TABLETS (1,000 MG TOTAL) BY MOUTH DAILY WITH SUPPER.     omeprazole 20 MG capsule  Commonly known as:  PRILOSEC  TAKE 1 CAPSULE EVERY DAY     PEN NEEDLES 31GX5/16" 31G X 8 MM Misc  Lantus solostar pen.     rOPINIRole 1 MG tablet  Commonly known as:  REQUIP  Take 1 mg by mouth at bedtime. 1 1/2 at bedtime           Objective:   Physical Exam BP 118/75  Pulse 72  Temp(Src) 98.2 F (36.8 C) (Oral)  Wt 221 lb (100.245 kg)  SpO2 99% General -- alert, well-developed, NAD.   HEENT-- Not pale.   Lungs -- normal respiratory effort, no intercostal retractions, no accessory muscle use, and normal breath sounds.  Heart-- normal rate, regular rhythm, no murmur.  Extremities-- no pretibial edema bilaterally  Neurologic--  alert & oriented X3. Speech  normal, gait normal, strength normal in all extremities.  Psych-- Cognition and judgment appear intact. Cooperative with normal attention span and concentration. No anxious or depressed appearing. She seems to be doing great emotionally       Assessment & Plan:

## 2013-05-20 NOTE — Assessment & Plan Note (Signed)
Seems to be doing great, I think that the increase in exercise has help her both physically and mentally. Patient aware encouraged to continue exercising

## 2013-05-20 NOTE — Assessment & Plan Note (Signed)
On monthly shot, labs . Injection provided today

## 2013-05-20 NOTE — Assessment & Plan Note (Signed)
Controlled, labs 

## 2013-05-20 NOTE — Progress Notes (Signed)
Pre visit review using our clinic review tool, if applicable. No additional management support is needed unless otherwise documented below in the visit note. 

## 2013-05-20 NOTE — Patient Instructions (Signed)
Get your blood work before you leave   Next visit is for a physical exam by 11-2013, fasting Please make an appointment    

## 2013-05-21 ENCOUNTER — Telehealth: Payer: Self-pay | Admitting: Internal Medicine

## 2013-05-21 NOTE — Telephone Encounter (Signed)
LM on voice mail of information.

## 2013-05-21 NOTE — Assessment & Plan Note (Signed)
going to [place] June 16, [place].. CDC  webpage reviewed, they do recommend hepatitis A, B. And thyphoid immunizations, will let pt know

## 2013-05-21 NOTE — Telephone Encounter (Signed)
Advise patient, she is going to [place], I review the CDC website and they do recommend hepatitis A, B and typhoid immunizations.  We can  Provide most of  those shots if she is agreeable.

## 2013-05-21 NOTE — Telephone Encounter (Signed)
Relevant patient education assigned to patient using Emmi. ° °

## 2013-06-08 ENCOUNTER — Ambulatory Visit (INDEPENDENT_AMBULATORY_CARE_PROVIDER_SITE_OTHER): Payer: Medicare Other | Admitting: Internal Medicine

## 2013-06-08 ENCOUNTER — Encounter: Payer: Self-pay | Admitting: Internal Medicine

## 2013-06-08 VITALS — BP 110/64 | HR 89 | Temp 98.3°F | Resp 12 | Wt 220.0 lb

## 2013-06-08 DIAGNOSIS — E119 Type 2 diabetes mellitus without complications: Secondary | ICD-10-CM

## 2013-06-08 LAB — HEMOGLOBIN A1C: Hgb A1c MFr Bld: 8.1 % — ABNORMAL HIGH (ref 4.6–6.5)

## 2013-06-08 NOTE — Patient Instructions (Signed)
Please continue: - Januvia 100 mg in am - Levemir 50 units at bedtime - Metformin 1000 mg at night - Amaryl 4 mg 2x a day  Please stop at the lab.  Please come back for a follow-up appointment in 3 months with your sugar log.

## 2013-06-08 NOTE — Progress Notes (Signed)
Patient ID: [name], female   DOB: 1945-07-16, 68 y.o.   MRN: [id number]  HPI: [name] is a 68 y.o.-year-old female, returning for f/u for DM2, dx 2006, insulin-dependent, uncontrolled, without complications. Last visit 3.5 mo ago.  Last hemoglobin A1c was: Lab Results  Component Value Date   HGBA1C 8.4* 01/28/2013   HGBA1C 8.9* 10/27/2012   HGBA1C 8.8* 07/20/2012   Pt is on a regimen of: - Januvia 100 mg - Levemir 50 units qhs - pens - Metformin 1000 mg at night - we had to decrease the dose to 1000 mg at night, since her LFTs increased. - Amaryl 4 mg bid We tried Invokana 300 >> she developed 3 fungal UTIs >> tx by ObGyn >> stopped Invokana 01/20/2013 She had transaminitis in the past 2/2 fatty liver.  We stopped Amaryl 4 bid in the past when started Januvia.  Pt checks her sugars now (2-3 x a day) - does bring a log: - am: 150-160 >> 119-165 in last 2 weeks >> 130-150 >> 140-180 >> 129-152 >> 119-194 >> 98-140 (170s) - before lunch: 129, one check >> 110 >> not checking >> n/c  >> 142-175 (256 x1) >> 91-190 (280 if forgot meds) - 2h after lunch: 125 - before dinner: 149 and 183 >> not checking >> 148, 283 >> 151-258 >> [phone] (200) - bedtime not checked lately, before: 200s >> 137 and 220 >> 190-217 >> 174, 223, 320 >> 151-221 >> n/c No lows; she has hypoglycemia awareness at 70.   Started Silver sneakers >> sugars improved.   Pt does not have chronic kidney disease, last BUN/creatinine was:  Lab Results  Component Value Date   BUN 12 05/20/2013   CREATININE 0.8 05/20/2013  She is on Losartan.  Last set of lipids reviewed: Lab Results  Component Value Date   CHOL 145 11/30/2012   HDL 40.50 11/30/2012   LDLCALC 68 11/30/2012   LDLDIRECT 134.7 12/06/2010   TRIG 182.0* 11/30/2012   CHOLHDL 4 11/30/2012  She is on Lipitor 20. Her LFS are better: Lab Results  Component Value Date   ALT 38* 05/20/2013   AST 36 05/20/2013   ALKPHOS 65 05/20/2013   BILITOT  0.5 05/20/2013   Pt's last eye exam was 10 mo ago >> no DR.  Occasionally numbness and tingling in her toes.  PMH: She also has a history of HL - started Lipitor, hypertension, depression, IBS, restless leg syndrome, osteopenia (DEXA scan in 2009 in 2011, on calcium and vitamin D), chest pain with negative stress test 09/2009, fatty liver, and diverticulosis.  ROS: Constitutional: no weight gain/loss, no fatigue, no subjective hyperthermia/hypothermia Eyes: no blurry vision, no xerophthalmia ENT: no sore throat, no nodules palpated in throat, no dysphagia/odynophagia, no hoarseness Cardiovascular: no CP/SOB/palpitations/leg swelling Respiratory: no cough/no SOB Gastrointestinal: no N/V/D/C Musculoskeletal: no muscle/joint aches Skin: no rashes Neurological: no tremors/numbness/tingling/+ mild orthostatic dizziness (even before starting Invokana)  I reviewed pt's medications, allergies, PMH, social hx, family hx and no changes required. PE: BP 110/64  Pulse 89  Temp(Src) 98.3 F (36.8 C) (Oral)  Resp 12  Wt 220 lb (99.791 kg)  SpO2 95% Wt Readings from Last 3 Encounters:  06/08/13 220 lb (99.791 kg)  05/20/13 221 lb (100.245 kg)  03/01/13 219 lb (99.338 kg)  Constitutional: overweight, in NAD Eyes: PERRLA, EOMI, no exophthalmos ENT: moist mucous membranes, no thyromegaly, no cervical lymphadenopathy Cardiovascular: RRR, No MRG Respiratory: CTA B Gastrointestinal: abdomen soft, NT, ND, BS+  Musculoskeletal: no deformities, strength intact in all 4 Skin: moist, warm, no rashes  ASSESSMENT: 1. DM2, insulin-dependent, uncontrolled, without complications - started U44 inj >> feels better  PLAN:  1. Patient with long standing, uncontrolled, DM2, with the last hemoglobin A1c of 8.4. She is on a regimen of basal insulin 50 units + also Januvia, metformin and Amaryl. Her sugars have improved since last visit after starting exercise. She has occasional higher sugars if she does not  exercise or if forgets to take her medicines - I advised her to: - continue Januvia 100 mg in am - continue Levemir 50 units at bedtime - continue Metformin 1000 mg at dinnertime - continue Amaryl 4 mg 2x a day - check Hba1c today - her LFTs are greatly improved so we can increase the Metformin to 1000 mg bid in the near future if we need to - Return to clinic in 3 months with her sugar log  Office Visit on 06/08/2013  Component Date Value Ref Range Status  . Hemoglobin A1C 06/08/2013 8.1* 4.6 - 6.5 % Final   Glycemic Control Guidelines for People with Diabetes:Non Diabetic:  <6%Goal of Therapy: <7%Additional Action Suggested:  >8%    HbA1c better!

## 2013-06-14 ENCOUNTER — Telehealth: Payer: Self-pay | Admitting: Internal Medicine

## 2013-06-14 NOTE — Telephone Encounter (Signed)
Caller name: [name] Relation to pt: patient Call back number: [phone] Pharmacy:  Reason for call: patient would like to come in and get a Hep A shot because she is going to [place]. Please advise.

## 2013-06-14 NOTE — Telephone Encounter (Signed)
Patient is leaving next week for [place]. Advised that it would not have time to build up immunity.   Agree?

## 2013-06-15 ENCOUNTER — Encounter: Payer: Self-pay | Admitting: Podiatry

## 2013-06-15 ENCOUNTER — Ambulatory Visit (INDEPENDENT_AMBULATORY_CARE_PROVIDER_SITE_OTHER): Payer: Medicare Other | Admitting: *Deleted

## 2013-06-15 ENCOUNTER — Ambulatory Visit (INDEPENDENT_AMBULATORY_CARE_PROVIDER_SITE_OTHER): Payer: Medicare Other | Admitting: Podiatry

## 2013-06-15 ENCOUNTER — Encounter: Payer: Self-pay | Admitting: Internal Medicine

## 2013-06-15 VITALS — BP 147/76 | HR 82 | Resp 16 | Ht 66.0 in | Wt 220.0 lb

## 2013-06-15 DIAGNOSIS — E538 Deficiency of other specified B group vitamins: Secondary | ICD-10-CM

## 2013-06-15 DIAGNOSIS — L03039 Cellulitis of unspecified toe: Secondary | ICD-10-CM

## 2013-06-15 DIAGNOSIS — M2022 Hallux rigidus, left foot: Secondary | ICD-10-CM

## 2013-06-15 DIAGNOSIS — E1149 Type 2 diabetes mellitus with other diabetic neurological complication: Secondary | ICD-10-CM

## 2013-06-15 DIAGNOSIS — M2020 Hallux rigidus, unspecified foot: Secondary | ICD-10-CM

## 2013-06-15 MED ORDER — CYANOCOBALAMIN 1000 MCG/ML IJ SOLN
1000.0000 ug | Freq: Once | INTRAMUSCULAR | Status: AC
  Administered 2013-06-15: 1000 ug via INTRAMUSCULAR

## 2013-06-15 NOTE — Progress Notes (Signed)
   Subjective:    Patient ID: Marijean Niemann, female    DOB: 05-Apr-1945, 68 y.o.   MRN: [id number]  HPI Comments: "I have an ingrown, I think"  Patient c/o aching 1st toe left for about 1-2 weeks. The toenail is thick and discolored. The nail is very incurvated into the skin. She tries to trim with no relief.  Toe Pain       Review of Systems  Skin:       Change in nails  All other systems reviewed and are negative.      Objective:   Physical Exam: I have reviewed her past medical history medications allergies surgeries social history and review of systems. Recent hemoglobin A1c 8.4. Pulses are strongly palpable bilateral. Neurologic sensorium is intact per since once the monofilament. Deep tendon reflexes are intact bilateral. Muscle strength was 5 over 5 dorsiflexors plantar flexors inverters everters all intrinsic musculature appears to be intact. Cutaneous evaluation demonstrates severe onychocryptosis with abscess hallux left.        Assessment & Plan:  Assessment: Onychocryptosis with paronychia and ingrown nail hallux left.  Plan: Discussed etiology pathology conservative versus surgical therapies. We debrided nails 1 through 5 bilateral is a covered service. At this time we also performed a I&D which she tolerated well under local anesthetic. She was given both oral and written home-going instructions for the care of of the toe. She will followup with me when she returns from [place]. We will consider a permanent removal 1 she has better control of her diabetes.

## 2013-06-15 NOTE — Telephone Encounter (Signed)
Agree, needs to be started at least 2 weeks before traveling If she likes to start the hepatitis A series for future travel that is ok at her convenience

## 2013-06-15 NOTE — Patient Instructions (Signed)

## 2013-06-18 ENCOUNTER — Encounter: Payer: Self-pay | Admitting: Internal Medicine

## 2013-06-22 ENCOUNTER — Ambulatory Visit: Payer: Medicare Other

## 2013-06-29 ENCOUNTER — Ambulatory Visit (INDEPENDENT_AMBULATORY_CARE_PROVIDER_SITE_OTHER): Payer: Medicare Other | Admitting: Podiatry

## 2013-06-29 ENCOUNTER — Encounter: Payer: Self-pay | Admitting: Internal Medicine

## 2013-06-29 ENCOUNTER — Encounter: Payer: Self-pay | Admitting: Podiatry

## 2013-06-29 VITALS — BP 124/58 | HR 82 | Resp 16

## 2013-06-29 DIAGNOSIS — L03039 Cellulitis of unspecified toe: Secondary | ICD-10-CM

## 2013-06-29 DIAGNOSIS — Z9889 Other specified postprocedural states: Secondary | ICD-10-CM

## 2013-06-29 DIAGNOSIS — E1149 Type 2 diabetes mellitus with other diabetic neurological complication: Secondary | ICD-10-CM

## 2013-06-29 NOTE — Progress Notes (Signed)
She presents today for a followup visit status post I&D hallux left. She denies fever chills nausea vomiting states it is healed 100% and has discontinued soaking.  Objective: Vital signs are stable she is alert and oriented x3. Pulses are palpable bilateral. There is no erythema edema saline is drainage or odor to the hallux nail plate left area  Assessment: Well-healing surgical toe left.  Plan: Followup with me as needed discontinue soaking.

## 2013-07-11 ENCOUNTER — Telehealth: Payer: Self-pay | Admitting: Internal Medicine

## 2013-07-11 DIAGNOSIS — R911 Solitary pulmonary nodule: Secondary | ICD-10-CM

## 2013-07-11 NOTE — Telephone Encounter (Signed)
Advise pt : due for CT , order already enter

## 2013-07-14 ENCOUNTER — Telehealth: Payer: Self-pay | Admitting: Internal Medicine

## 2013-07-14 NOTE — Telephone Encounter (Signed)
Caller name: Dejanique   Call back number:[phone]   Reason for call:   Pt returned call; CT scan is actually on 7/9 instead of 7/8.  Just FYI

## 2013-07-14 NOTE — Telephone Encounter (Signed)
Spoke wit patient who stated that follow up CT is scheduled for today.

## 2013-07-15 ENCOUNTER — Ambulatory Visit
Admission: RE | Admit: 2013-07-15 | Discharge: 2013-07-15 | Disposition: A | Discharge status: Home / Self Care | Payer: Medicare Other | Source: Ambulatory Visit | Attending: Internal Medicine | Admitting: Internal Medicine

## 2013-07-15 DIAGNOSIS — R911 Solitary pulmonary nodule: Secondary | ICD-10-CM

## 2013-07-27 ENCOUNTER — Encounter: Payer: Self-pay | Admitting: Internal Medicine

## 2013-07-27 ENCOUNTER — Encounter: Payer: Self-pay | Admitting: Podiatry

## 2013-07-27 ENCOUNTER — Ambulatory Visit (INDEPENDENT_AMBULATORY_CARE_PROVIDER_SITE_OTHER): Payer: Medicare Other | Admitting: Internal Medicine

## 2013-07-27 ENCOUNTER — Ambulatory Visit (INDEPENDENT_AMBULATORY_CARE_PROVIDER_SITE_OTHER): Payer: Medicare Other | Admitting: Podiatry

## 2013-07-27 VITALS — BP 114/74 | HR 88 | Temp 97.5°F | Resp 12 | Wt 224.8 lb

## 2013-07-27 DIAGNOSIS — L600 Ingrowing nail: Secondary | ICD-10-CM

## 2013-07-27 DIAGNOSIS — E119 Type 2 diabetes mellitus without complications: Secondary | ICD-10-CM

## 2013-07-27 DIAGNOSIS — E1149 Type 2 diabetes mellitus with other diabetic neurological complication: Secondary | ICD-10-CM

## 2013-07-27 MED ORDER — METFORMIN HCL 1000 MG PO TABS
1000.0000 mg | ORAL_TABLET | Freq: Two times a day (BID) | ORAL | Status: DC
Start: 2013-07-27 — End: 2016-12-05

## 2013-07-27 MED ORDER — GLUCOSE BLOOD VI STRP: ORAL_STRIP | Status: DC

## 2013-07-27 NOTE — Progress Notes (Signed)
Patient ID: [name], female   DOB: Dec 03, 1945, 68 y.o.   MRN: [id number]  HPI: [name] is a 68 y.o.-year-old female, returning for f/u for DM2, dx 2006, insulin-dependent, uncontrolled, without complications. Last visit 1.5 mo ago.  Last hemoglobin A1c was: Lab Results  Component Value Date   HGBA1C 8.1* 06/08/2013   HGBA1C 8.4* 01/28/2013   HGBA1C 8.9* 10/27/2012   Pt is on a regimen of: - Januvia 100 mg - Levemir 50 units qhs - pens - Metformin 1000 mg at night - we had to decrease the dose to 1000 mg at night, since her LFTs increased. - Amaryl 4 mg bid We tried Invokana 300 >> she developed 3 fungal UTIs >> tx by ObGyn >> stopped Invokana 01/20/2013 She had transaminitis in the past 2/2 fatty liver.  We stopped Amaryl 4 bid in the past when started Januvia.  Pt checks her sugars now (2-3 x a day) - brings a log for last 3 days! >> CBGs much increased: - am: 150-160 >> 119-165 in last 2 weeks >> 130-150 >> 140-180 >> 129-152 >> 119-194 >> 98-140 (170s) >> 198, 238 - before lunch: 129, one check >> 110 >> not checking >> n/c  >> 142-175 (256 x1) >> 91-190 (280 if forgot meds) >> n/c - 2h after lunch: 125 >> n/c - before dinner: 149 and 183 >> not checking >> 148, 283 >> 151-258 >> [phone] (200) >> 386! - bedtime not checked lately, before: 200s >> 137 and 220 >> 190-217 >> 174, 223, 320 >> 151-221 >> n/c >> 225 No lows; she has hypoglycemia awareness at 70.   When she started Silver sneakers, her sugars improved, but now she is not going and also does not watch her diet >> sugars much higher.  Pt does not have chronic kidney disease, last BUN/creatinine was:  Lab Results  Component Value Date   BUN 12 05/20/2013   CREATININE 0.8 05/20/2013  She is on Losartan.  Last set of lipids reviewed: Lab Results  Component Value Date   CHOL 145 11/30/2012   HDL 40.50 11/30/2012   LDLCALC 68 11/30/2012   LDLDIRECT 134.7 12/06/2010   TRIG 182.0* 11/30/2012   CHOLHDL 4  11/30/2012  She is on Lipitor 20. Her LFTs are better: Lab Results  Component Value Date   ALT 38* 05/20/2013   AST 36 05/20/2013   ALKPHOS 65 05/20/2013   BILITOT 0.5 05/20/2013   Pt's last eye exam was this year >> no DR.  Occasionally numbness and tingling in her toes.  PMH: She also has a history of HL - started Lipitor, hypertension, depression, IBS, restless leg syndrome, osteopenia (DEXA scan in 2009 in 2011, on calcium and vitamin D), chest pain with negative stress test 09/2009, fatty liver, and diverticulosis.  ROS: Constitutional: no weight gain/loss, no fatigue, no subjective hyperthermia/hypothermia Eyes: no blurry vision, no xerophthalmia ENT: no sore throat, no nodules palpated in throat, no dysphagia/odynophagia, no hoarseness Cardiovascular: no CP/SOB/palpitations/leg swelling Respiratory: no cough/no SOB Gastrointestinal: no N/V/D/C Musculoskeletal: no muscle/joint aches Skin: no rashes Neurological: no tremors/numbness/tingling  I reviewed pt's medications, allergies, PMH, social hx, family hx and no changes required. PE: BP 114/74  Pulse 88  Temp(Src) 97.5 F (36.4 C) (Oral)  Resp 12  Wt 224 lb 12.8 oz (101.969 kg)  SpO2 97% Wt Readings from Last 3 Encounters:  07/27/13 224 lb 12.8 oz (101.969 kg)  06/15/13 220 lb (99.791 kg)  06/08/13 220 lb (99.791 kg)  Constitutional: overweight, in NAD Eyes: PERRLA, EOMI, no exophthalmos ENT: moist mucous membranes, no thyromegaly, no cervical lymphadenopathy Cardiovascular: RRR, No MRG Respiratory: CTA B Gastrointestinal: abdomen soft, NT, ND, BS+ Musculoskeletal: no deformities, strength intact in all 4 Skin: moist, warm, no rashes  ASSESSMENT: 1. DM2, insulin-dependent, uncontrolled, without complications  PLAN:  1. Patient with long standing, uncontrolled, DM2, worsened after stopping exercise and having dietary indiscretions She is on a regimen of basal insulin 50 units + also Januvia, metformin and  Amaryl.  - I advised her to: Patient Instructions  Please: - continue Januvia 100 mg in am  - continue Levemir 50 units at bedtime   - continue Amaryl 4 mg 2x a day Increase Metformin to 1000 mg 2x a day with meals. Please return in 1.5 months with your sugar log.  - she agrees to start going back to Silver sneakers and also to limit sweets - CBG in the office: 300s - her LFTs are greatly improved so we can increase the Metformin to 1000 mg bid >> check LFTs at next visit - refilled strips - Return to clinic in 1.5 months with her sugar log

## 2013-07-27 NOTE — Progress Notes (Signed)
She presents today chief complaint of painful hallux nails bilateral. She denies fever chills nausea vomiting muscle aches and pains. She does state that her blood sugars have been running very high lately.  Objective: Vital signs are stable she is alert and oriented x3 her nails are thick yellow dystrophic onychomycotic and demonstrates distal onychocryptosis.  Assessment: Pain in limb secondary to onychocryptosis and onychomycosis.  Plan: We did perform a total nail avulsion with matrixectomy today however her blood sugars are very high so at this point we're discolored trim the nails and way for her to get her blood sugar under control and then we will remove the nail plates and total.

## 2013-07-27 NOTE — Patient Instructions (Signed)
Please: - continue Januvia 100 mg in am  - continue Levemir 50 units at bedtime   - continue Amaryl 4 mg 2x a day Increase Metformin to 1000 mg 2x a day with meals.  Please return in 1.5 months with your sugar log.

## 2013-08-05 ENCOUNTER — Other Ambulatory Visit: Payer: Self-pay | Admitting: Internal Medicine

## 2013-08-06 ENCOUNTER — Encounter: Payer: Self-pay | Admitting: Internal Medicine

## 2013-08-12 ENCOUNTER — Encounter: Payer: Self-pay | Admitting: Internal Medicine

## 2013-08-12 ENCOUNTER — Ambulatory Visit (INDEPENDENT_AMBULATORY_CARE_PROVIDER_SITE_OTHER): Payer: Medicare Other | Admitting: Internal Medicine

## 2013-08-12 ENCOUNTER — Ambulatory Visit (INDEPENDENT_AMBULATORY_CARE_PROVIDER_SITE_OTHER)
Admission: RE | Admit: 2013-08-12 | Discharge: 2013-08-12 | Disposition: A | Discharge status: Home / Self Care | Payer: Medicare Other | Source: Ambulatory Visit | Attending: Internal Medicine | Admitting: Internal Medicine

## 2013-08-12 VITALS — BP 152/87 | HR 74 | Temp 97.7°F | Wt 223.4 lb

## 2013-08-12 DIAGNOSIS — M25569 Pain in unspecified knee: Secondary | ICD-10-CM

## 2013-08-12 DIAGNOSIS — M25561 Pain in right knee: Secondary | ICD-10-CM

## 2013-08-12 DIAGNOSIS — M25519 Pain in unspecified shoulder: Secondary | ICD-10-CM

## 2013-08-12 DIAGNOSIS — M25511 Pain in right shoulder: Secondary | ICD-10-CM

## 2013-08-12 NOTE — Progress Notes (Signed)
Subjective:    Patient ID: [name], female    DOB: 23-Oct-1945, 68 y.o.   MRN: [id number]  DOS:  08/12/2013 Type of visit - description: acute History: Several years history of right shoulder discomfort, worse for last week. Pain is described as a dull ache, worse with certain movements. Is taking Aleve one tablet daily which seems to help. Also a right knee pain anteriorly for 2 days, sharp, worse with weightbearing  ROS Denies any recent injuries. Knee is not red or swollen. No fever or chills. No neck pain or upper extremity paresthesias  Past Medical History  Diagnosis Date  . Diabetes mellitus   . Depression     sees Dr.Cotle  . Hyperlipemia   . Hypertension   . IBS (irritable bowel syndrome)     and dyspepsia  . RLS (restless legs syndrome)   . Adenomatous colon polyp 03/1988  . Osteopenia     dexa 06/2007 and 10/11 Rx CA vitamin d  . Chest pain      Sept 2011: stress test neg  . Diverticulosis   . Fatty liver     Increased LFTs, saw GI 06/2011, likely from fatty liver     Past Surgical History  Procedure Laterality Date  . Uterine fibroid embolization      90s  . Tonsillectomy      History   Social History  . Marital Status: Married    Spouse Name: [name]    Number of Children: 3  . Years of Education: N/A   Occupational History  . retired, was a Leisure centre manager)     Therapist, sports  .     Social History Main Topics  . Smoking status: Former Smoker -- 2.00 packs/day for 10 years    Types: Cigarettes    Quit date: 11/26/1975  . Smokeless tobacco: Never Used     Comment: used to smoke 2 ppd  . Alcohol Use: Yes     Comment: rarely  . Drug Use: No  . Sexual Activity: Not on file   Other Topics Concern  . Not on file   Social History Narrative   2 g-child        Medication List       This list is accurate as of: 08/12/13  5:45 PM.  Always use your most recent med list.               ABILIFY 5 MG tablet  Generic drug:  ARIPiprazole    Take 2.5 mg by mouth daily.     atenolol 50 MG tablet  Commonly known as:  TENORMIN  TAKE 1 TABLET DAILY     atorvastatin 20 MG tablet  Commonly known as:  LIPITOR  TAKE 1 TABLET BY MOUTH ONCE DAILY     clonazePAM 0.5 MG tablet  Commonly known as:  KLONOPIN  Take by mouth. As needed     DULoxetine 60 MG capsule  Commonly known as:  CYMBALTA  Take 60 mg by mouth daily.     glimepiride 4 MG tablet  Commonly known as:  AMARYL  TAKE 1 TABLET (4 MG TOTAL) BY MOUTH 2 (TWO) TIMES DAILY BEFORE A MEAL.     glucose blood test strip  Commonly known as:  ONE TOUCH ULTRA TEST  Check twice a day     JANUVIA 100 MG tablet  Generic drug:  sitaGLIPtin  TAKE 1 TABLET (100 MG TOTAL) BY MOUTH DAILY.     LEVEMIR FLEXTOUCH  100 UNIT/ML Pen  Generic drug:  Insulin Detemir  INJECT 0.5ML INTO SKIN DAILY     losartan 50 MG tablet  Commonly known as:  COZAAR  TAKE 1 TABLET (50 MG TOTAL) BY MOUTH DAILY.     metFORMIN 1000 MG tablet  Commonly known as:  GLUCOPHAGE  Take 1 tablet (1,000 mg total) by mouth 2 (two) times daily with a meal.     omeprazole 20 MG capsule  Commonly known as:  PRILOSEC  TAKE 1 CAPSULE EVERY DAY     PEN NEEDLES 31GX5/16" 31G X 8 MM Misc  Lantus solostar pen.     rOPINIRole 1 MG tablet  Commonly known as:  REQUIP  Take 1 mg by mouth at bedtime. 1 1/2 at bedtime           Objective:   Physical Exam BP 152/87  Pulse 74  Temp(Src) 97.7 F (36.5 C) (Oral)  Wt 223 lb 6 oz (101.322 kg)  SpO2 96% General -- alert, well-developed, NAD.  Extremities--  Left shoulder normal Right shoulder slightly tender anteriorly at bicipital and deltoid area, no deformities, range of motion decrease when she tries to reach back. Knees symmetric, no effusion or warmness Neurologic--  alert & oriented X3. Speech normal, gait appropriate for age, strength symmetric and appropriate for age.  Psych-- Cognition and judgment appear intact. Cooperative with normal attention span  and concentration. No anxious or depressed appearing.      Assessment & Plan:    Shoulder pain, right side.  Chronic, worse for the past week. Rotator cuff? DJD? Plan:  X-ray, refer to sports medicine I recommend Tylenol instead of Aleve although if aleve is helping ok to take  1 tablet a day with food.  R knee pain- XR , sports med

## 2013-08-12 NOTE — Patient Instructions (Signed)
Please get your x-ray at the other [facility]  office located at: [address], across from [facility].  Please go to the basement, this is a walk-in facility, they are open from 8:30 to 5:30 PM. Phone number [phone].   Tylenol  500 mg OTC 2 tabs a day every 8 hours as needed for pain  Ok aleve if tylenol not helping. Always take it with food because may cause gastritis and ulcers. If you notice nausea, stomach pain, change in the color of stools --->  Stop the medicine and let us know

## 2013-08-19 ENCOUNTER — Ambulatory Visit (INDEPENDENT_AMBULATORY_CARE_PROVIDER_SITE_OTHER): Payer: Medicare Other | Admitting: Family Medicine

## 2013-08-19 ENCOUNTER — Encounter: Payer: Self-pay | Admitting: Family Medicine

## 2013-08-19 ENCOUNTER — Encounter: Payer: Self-pay | Admitting: Internal Medicine

## 2013-08-19 ENCOUNTER — Other Ambulatory Visit (INDEPENDENT_AMBULATORY_CARE_PROVIDER_SITE_OTHER): Payer: Medicare Other

## 2013-08-19 VITALS — BP 136/84 | HR 73 | Ht 66.5 in | Wt 221.0 lb

## 2013-08-19 DIAGNOSIS — M1710 Unilateral primary osteoarthritis, unspecified knee: Secondary | ICD-10-CM

## 2013-08-19 DIAGNOSIS — M25519 Pain in unspecified shoulder: Secondary | ICD-10-CM

## 2013-08-19 DIAGNOSIS — M25511 Pain in right shoulder: Secondary | ICD-10-CM

## 2013-08-19 DIAGNOSIS — M67919 Unspecified disorder of synovium and tendon, unspecified shoulder: Secondary | ICD-10-CM

## 2013-08-19 DIAGNOSIS — M1990 Unspecified osteoarthritis, unspecified site: Secondary | ICD-10-CM | POA: Insufficient documentation

## 2013-08-19 DIAGNOSIS — M1711 Unilateral primary osteoarthritis, right knee: Secondary | ICD-10-CM

## 2013-08-19 DIAGNOSIS — M719 Bursopathy, unspecified: Secondary | ICD-10-CM

## 2013-08-19 DIAGNOSIS — M7551 Bursitis of right shoulder: Secondary | ICD-10-CM

## 2013-08-19 MED ORDER — DICLOFENAC SODIUM 2 % TD SOLN: TRANSDERMAL | Status: DC

## 2013-08-19 NOTE — Progress Notes (Signed)
[name] [address], [name] Phone: [phone] Subjective:    I'm seeing this patient by the request  of:  Lindsay November, MD   CC: Right shoulder and right knee pain  [id number] [name] [name] is a 68 y.o. female coming in with complaint of right shoulder and right knee pain  Regarding patient's right shoulder pain she states that she has had this pain for multiple months if not years. It seemed to be more intermittent but now seems to be coming daily. Patient describes it as a dull aching sensation with a sharp pain with certain movements. Patient states it is very rare that she has ever pain free now. Patient denies any radiation down her arm or any numbness. Denies any neck pain it is associated with. Patient is a severity is 951 at 8/84 with certain movements. Patient states that it does not wake her up at night. Still able to do daily activities.  Patient states that her right knee pain has been hurting for approximately 1 month. Mostly over the medial aspect of the knee. Denies any pain with twisting but states that going up or downstairs seems to be most severe. Patient has had x-rays of this knee previously. X-rays the patient's knee show moderate osteoarthritic changes. These were nonweightbearing. Denies any radiation down the leg or any clicking popping or giving out on her. Has not tried any hope modalities at this time other than Aleve.    Past medical history, social, surgical and family history all reviewed in electronic medical record.   Review of Systems: No headache, visual changes, nausea, vomiting, diarrhea, constipation, dizziness, abdominal pain, skin rash, fevers, chills, night sweats, weight loss, swollen lymph nodes, body aches, joint swelling, muscle aches, chest pain, shortness of breath, mood changes.   Objective Blood pressure 136/84, pulse 73, height 5' 6.5" (1.689 m), weight 221 lb (100.245 kg), SpO2 98.00%.    General: No apparent distress alert and oriented x3 mood and affect normal, dressed appropriately.  HEENT: Pupils equal, extraocular movements intact  Respiratory: Patient's speak in full sentences and does not appear short of breath  Cardiovascular: No lower extremity edema, non tender, no erythema  Skin: Warm dry intact with no signs of infection or rash on extremities or onTry the pennsaid  axial skeleton.  Abdomen: Soft nontender  Neuro: Cranial nerves II through XII are intact, neurovascularly intact in all extremities with 2+ DTRs and 2+ pulses.  Lymph: No lymphadenopathy of posterior or anterior cervical chain or axillae bilaterally.  Gait normal with good balance and coordination.  MSK:  Non tender with full range of motion and good stability and symmetric strength and tone of  elbows, wrist, hip, knee and ankles bilaterally.  Knee: Right Normal to inspection with no erythema or effusion or obvious bony abnormalities. Moderate tenderness to palpation ROM full in flexion and extension and lower leg rotation. Ligaments with solid consistent endpoints including ACL, PCL, LCL, MCL. Negative Mcmurray's, Apley's, and Thessalonian tests. Mild painful patellar compression. Patellar glide is minimal crepitus. Patellar and quadriceps tendons unremarkable. Hamstring and quadriceps strength is normal.  Contralateral knee mild tenderness to palpation   Shoulder: Right Inspection reveals no abnormalities, atrophy or asymmetry. Palpation is normal with no tenderness over AC joint or bicipital groove. ROM is full in all planes passively. Rotator cuff strength normal throughout. signs of impingement with positive Neer and Hawkin's tests, but negative empty can sign. Speeds and Yergason's tests normal.  No labral pathology noted with negative Obrien's, negative clunk and good stability. Normal scapular function observed. No painful arc and no drop arm sign. No apprehension sign Mild crepitus  on range of motion testing Contralateral shoulder unremarkable  MSK US performed of: Right This study was ordered, performed, and interpreted by Charlann Boxer D.O.  Shoulder:   Supraspinatus:  Appears normal on long and transverse views, Bursal bulge seen with shoulder abduction on impingement view. Mild underlying arthritis Infraspinatus:  Appears normal on long and transverse views. Significant increase in Doppler flow Subscapularis:  Appears normal on long and transverse views. Positive bursa Teres Minor:  Appears normal on long and transverse views. AC joint:  Capsule undistended, no geyser sign. Glenohumeral Joint:  Moderate arthritis Glenoid Labrum:  Intact without visualized tears. Biceps Tendon:  Appears normal on long and transverse views, no fraying of tendon, tendon located in intertubercular groove, no subluxation with shoulder internal or external rotation.  Impression: Subacromial bursitis with underlying arthritis  Procedure: Real-time Ultrasound Guided Injection of right glenohumeral joint Device: GE Logiq E  Ultrasound guided injection is preferred based studies that show increased duration, increased effect, greater accuracy, decreased procedural pain, increased response rate with ultrasound guided versus blind injection.  Verbal informed consent obtained.  Time-out conducted.  Noted no overlying erythema, induration, or other signs of local infection.  Skin prepped in a sterile fashion.  Local anesthesia: Topical Ethyl chloride.  With sterile technique and under real time ultrasound guidance:  Joint visualized.  23g 1  inch needle inserted posterior approach. Pictures taken for needle placement. Patient did have injection of 2 cc of 1% lidocaine, 2 cc of 0.5% Marcaine, and 1.0 cc of Kenalog 40 mg/dL. Completed without difficulty  Pain immediately resolved suggesting accurate placement of the medication.  Advised to call if fevers/chills, erythema, induration, drainage,  or persistent bleeding.  Images permanently stored and available for review in the ultrasound unit.  Impression: Technically successful ultrasound guided injection.        Impression and Recommendations:     This case required medical decision making of moderate complexity.

## 2013-08-19 NOTE — Patient Instructions (Signed)
Good to meet you Ice 20 minutes 2 times daily. Usually after activity and before bed. Exercises 3 times a week. Alternate for the knee and the shoulder Try the pennsaid twice daily to knee if needed.  Stop if hurts your stomach.  Only wear the brace during the day. Vitamin D 2000 IU daily.  Come back in 3- weeks.

## 2013-08-19 NOTE — Assessment & Plan Note (Signed)
Patient does have moderate osteoarthritis previous x-rays. We discussed icing, patient was given a medial unloader brace today and was given home exercise program. Patient will try this conservative therapy and come back in 3-4 weeks for further evaluation. She continues to have pain I would consider an injection. Did not want to do 2 corticosteroid injections in a diabetic today.

## 2013-08-19 NOTE — Assessment & Plan Note (Signed)
Patient was given injection is described above. Patient tolerated the procedure very well and was completely pain-free at rest. Patient was given home exercise program we discussed topical anti-inflammatories over-the-counter medications that could be beneficial. Patient was also given an icing protocol. Patient will try these differences and come back and see me again in 3-4 weeks for further evaluation and treatment.

## 2013-08-20 ENCOUNTER — Encounter: Payer: Self-pay | Admitting: Internal Medicine

## 2013-08-20 NOTE — Telephone Encounter (Signed)
Caller name:Jeroline Relation to pt: Call back number: Pharmacy:  Reason for call: Marvelyn called to make an appointment, since we did not have anything today she is going to go to [facility]

## 2013-08-21 ENCOUNTER — Other Ambulatory Visit: Payer: Self-pay | Admitting: Internal Medicine

## 2013-08-25 ENCOUNTER — Other Ambulatory Visit: Payer: Self-pay | Admitting: Internal Medicine

## 2013-08-26 ENCOUNTER — Ambulatory Visit (INDEPENDENT_AMBULATORY_CARE_PROVIDER_SITE_OTHER): Payer: Medicare Other | Admitting: Internal Medicine

## 2013-08-26 ENCOUNTER — Encounter: Payer: Self-pay | Admitting: Internal Medicine

## 2013-08-26 VITALS — BP 139/79 | HR 72 | Temp 98.2°F | Wt 215.4 lb

## 2013-08-26 DIAGNOSIS — R413 Other amnesia: Secondary | ICD-10-CM

## 2013-08-26 NOTE — Assessment & Plan Note (Signed)
Memory issues? See previous entry, nothing has changed since my last evaluation, will refer to neurology

## 2013-08-26 NOTE — Progress Notes (Signed)
Subjective:    Patient ID: [name], female    DOB: December 27, 1945, 68 y.o.   MRN: [id number]  DOS:  08/26/2013 Type of visit - description: acute, here by herself History: Her family continue complaining of the patient forgetting conversations from the previous day. Otherwise she feels well, good compliance with medications, depression is well-controlled    Past Medical History  Diagnosis Date  . Diabetes mellitus   . Depression     sees Dr.Cotle  . Hyperlipemia   . Hypertension   . IBS (irritable bowel syndrome)     and dyspepsia  . RLS (restless legs syndrome)   . Adenomatous colon polyp 03/1988  . Osteopenia     dexa 06/2007 and 10/11 Rx CA vitamin d  . Chest pain      Sept 2011: stress test neg  . Diverticulosis   . Fatty liver     Increased LFTs, saw GI 06/2011, likely from fatty liver     Past Surgical History  Procedure Laterality Date  . Uterine fibroid embolization      90s  . Tonsillectomy      History   Social History  . Marital Status: Married    Spouse Name: [name]    Number of Children: 3  . Years of Education: N/A   Occupational History  . retired, was a Leisure centre manager)     Therapist, sports  .     Social History Main Topics  . Smoking status: Former Smoker -- 2.00 packs/day for 10 years    Types: Cigarettes    Quit date: 11/26/1975  . Smokeless tobacco: Never Used     Comment: used to smoke 2 ppd  . Alcohol Use: Yes     Comment: rarely  . Drug Use: No  . Sexual Activity: Not on file   Other Topics Concern  . Not on file   Social History Narrative   2 g-child        Medication List       This list is accurate as of: 08/26/13  3:39 PM.  Always use your most recent med list.               ABILIFY 5 MG tablet  Generic drug:  ARIPiprazole  Take 2.5 mg by mouth daily.     atenolol 50 MG tablet  Commonly known as:  TENORMIN  TAKE 1 TABLET DAILY     atorvastatin 20 MG tablet  Commonly known as:  LIPITOR  TAKE 1 TABLET BY MOUTH  ONCE DAILY     B-D ULTRAFINE III SHORT PEN 31G X 8 MM Misc  Generic drug:  Insulin Pen Needle  USE AS DIRECTED     clonazePAM 0.5 MG tablet  Commonly known as:  KLONOPIN  Take by mouth. As needed     Diclofenac Sodium 2 % Soln  Apply twice daily.     DULoxetine 60 MG capsule  Commonly known as:  CYMBALTA  Take 60 mg by mouth daily.     glimepiride 4 MG tablet  Commonly known as:  AMARYL  TAKE 1 TABLET (4 MG TOTAL) BY MOUTH 2 (TWO) TIMES DAILY BEFORE A MEAL.     glucose blood test strip  Commonly known as:  ONE TOUCH ULTRA TEST  Check twice a day     JANUVIA 100 MG tablet  Generic drug:  sitaGLIPtin  TAKE 1 TABLET (100 MG TOTAL) BY MOUTH DAILY.     LEVEMIR FLEXTOUCH 100 UNIT/ML  Pen  Generic drug:  Insulin Detemir  INJECT 0.5ML INTO SKIN DAILY     losartan 50 MG tablet  Commonly known as:  COZAAR  TAKE 1 TABLET (50 MG TOTAL) BY MOUTH DAILY.     metFORMIN 1000 MG tablet  Commonly known as:  GLUCOPHAGE  Take 1 tablet (1,000 mg total) by mouth 2 (two) times daily with a meal.     omeprazole 20 MG capsule  Commonly known as:  PRILOSEC  TAKE 1 CAPSULE EVERY DAY     rOPINIRole 1 MG tablet  Commonly known as:  REQUIP  Take 1 mg by mouth at bedtime. 1 1/2 at bedtime           Objective:   Physical Exam BP 139/79  Pulse 72  Temp(Src) 98.2 F (36.8 C) (Oral)  Wt 215 lb 6 oz (97.693 kg)  SpO2 96%  General -- alert, well-developed, NAD.  Neurologic--  alert & oriented X3. Speech normal, gait appropriate for age, strength symmetric and appropriate for age.  Psych-- Cognition and judgment appear intact. Cooperative with normal attention span and concentration. No anxious or depressed appearing.      Assessment & Plan:

## 2013-08-26 NOTE — Progress Notes (Signed)
Pre-visit discussion using our clinic review tool. No additional management support is needed unless otherwise documented below in the visit note.  

## 2013-08-26 NOTE — Patient Instructions (Signed)
Will refer you to neurology 

## 2013-09-02 ENCOUNTER — Telehealth: Payer: Self-pay | Admitting: *Deleted

## 2013-09-02 NOTE — Telephone Encounter (Signed)
Patient cancelled going to see a Dr close to home

## 2013-09-08 ENCOUNTER — Encounter: Payer: Self-pay | Admitting: Internal Medicine

## 2013-09-08 ENCOUNTER — Ambulatory Visit: Payer: Medicare Other | Admitting: Internal Medicine

## 2013-09-08 ENCOUNTER — Other Ambulatory Visit: Payer: Self-pay | Admitting: *Deleted

## 2013-09-08 ENCOUNTER — Ambulatory Visit (INDEPENDENT_AMBULATORY_CARE_PROVIDER_SITE_OTHER): Payer: Medicare Other | Admitting: Internal Medicine

## 2013-09-08 VITALS — BP 112/76 | HR 80 | Temp 98.1°F | Resp 12 | Wt 218.0 lb

## 2013-09-08 DIAGNOSIS — E119 Type 2 diabetes mellitus without complications: Secondary | ICD-10-CM

## 2013-09-08 LAB — HEMOGLOBIN A1C: Hgb A1c MFr Bld: 9.7 % — ABNORMAL HIGH (ref 4.6–6.5)

## 2013-09-08 LAB — MICROALBUMIN / CREATININE URINE RATIO
Creatinine,U: 81.9 mg/dL
Microalb Creat Ratio: 1.2 mg/g (ref 0.0–30.0)
Microalb, Ur: 1 mg/dL (ref 0.0–1.9)

## 2013-09-08 MED ORDER — INSULIN DETEMIR 100 UNIT/ML FLEXPEN: 60.0000 [IU] | PEN_INJECTOR | Freq: Every day | SUBCUTANEOUS | Status: DC

## 2013-09-08 NOTE — Patient Instructions (Signed)
Please continue: - Levemir 60 units at bedtime - Januvia 100 mg in am - Metformin 1000 mg at night  - Amaryl 4 mg 2x a day  Please restart the Silver Sneakers program.  Please stop at the lab.  Please return in 3 months with your sugar log.

## 2013-09-08 NOTE — Progress Notes (Signed)
Patient ID: [name], female   DOB: 1945/03/17, 68 y.o.   MRN: [id number]  HPI: [name] is a 68 y.o.-year-old female, returning for f/u for DM2, dx 2006, insulin-dependent, uncontrolled, without complications. Last visit 1.5 mo ago.  She got a steroid inj (for R shoulder bursitis) 2 weeks ago >> sugars higher  Last hemoglobin A1c was: Lab Results  Component Value Date   HGBA1C 8.1* 06/08/2013   HGBA1C 8.4* 01/28/2013   HGBA1C 8.9* 10/27/2012   Pt is on a regimen of: - Januvia 100 mg - Levemir 50 >> 60 units qhs - pens - Metformin 1000 mg at night  - Amaryl 4 mg bid We tried Invokana 300 >> she developed 3 fungal UTIs >> tx by ObGyn >> stopped Invokana 01/20/2013 She had transaminitis in the past 2/2 fatty liver.  We stopped Amaryl 4 bid in the past when started Januvia.  Pt checks her sugars 2 x a day >> CBGs better in ;last 2 weeks, after the initial increase after steroid: - am: 150-160 >> 119-165 in last 2 weeks >> 130-150 >> 140-180 >> 129-152 >> 119-194 >> 98-140 (170s) >> 198, 238 >> 115-164 - before lunch: 129, one check >> 110 >> not checking >> n/c  >> 142-175 (256 x1) >> 91-190 (280 if forgot meds) >> n/c >> 209, 268 - 2h after lunch: 125 >> n/c - before dinner: 149 and 183 >> not checking >> 148, 283 >> 151-258 >> [phone] (200) >> 386! >> 179 - bedtime not checked lately, before: 200s >> 137 and 220 >> 190-217 >> 174, 223, 320 >> 151-221 >> n/c >> 225 >> 114, 162 No lows; she has hypoglycemia awareness at 70.   When she started Silver sneakers, her sugars improved, but now she is not going and also does not watch her diet >> sugars much higher. She started to improve her diet and will soon restart Silver sneakers.  Pt does not have chronic kidney disease, last BUN/creatinine was:  Lab Results  Component Value Date   BUN 12 05/20/2013   CREATININE 0.8 05/20/2013  She is on Losartan.  Last set of lipids reviewed: Lab Results  Component Value Date   CHOL  145 11/30/2012   HDL 40.50 11/30/2012   LDLCALC 68 11/30/2012   LDLDIRECT 134.7 12/06/2010   TRIG 182.0* 11/30/2012   CHOLHDL 4 11/30/2012  She is on Lipitor 20. Her LFTs are better: Lab Results  Component Value Date   ALT 38* 05/20/2013   AST 36 05/20/2013   ALKPHOS 65 05/20/2013   BILITOT 0.5 05/20/2013   Pt's last eye exam was in 2015 >> no DR.  Occasionally numbness and tingling in her toes.  PMH: She also has a history of HL - started Lipitor, hypertension, depression, IBS, restless leg syndrome, osteopenia (DEXA scan in 2009 in 2011, on calcium and vitamin D), chest pain with negative stress test 09/2009, fatty liver, and diverticulosis.  ROS: Constitutional: no weight gain/loss, no fatigue, no subjective hyperthermia/hypothermia, + excessive urination Eyes: no blurry vision, no xerophthalmia ENT: no sore throat, no nodules palpated in throat, no dysphagia/odynophagia, no hoarseness Cardiovascular: no CP/SOB/palpitations/leg swelling Respiratory: no cough/no SOB Gastrointestinal: no N/V/D/C Musculoskeletal: no muscle/joint aches Skin: no rashes Neurological: no tremors/numbness/tingling  I reviewed pt's medications, allergies, PMH, social hx, family hx and no changes required. PE: BP 112/76  Pulse 80  Temp(Src) 98.1 F (36.7 C) (Oral)  Resp 12  Wt 218 lb (98.884 kg)  SpO2 95%  Wt Readings from Last 3 Encounters:  09/08/13 218 lb (98.884 kg)  08/26/13 215 lb 6 oz (97.693 kg)  08/19/13 221 lb (100.245 kg)  Constitutional: overweight, in NAD Eyes: PERRLA, EOMI, no exophthalmos ENT: moist mucous membranes, no thyromegaly, no cervical lymphadenopathy Cardiovascular: RRR, No MRG Respiratory: CTA B Gastrointestinal: abdomen soft, NT, ND, BS+ Musculoskeletal: no deformities, strength intact in all 4 Skin: moist, warm, no rashes  ASSESSMENT: 1. DM2, insulin-dependent, uncontrolled, without complications  PLAN:  1. Patient with long standing, uncontrolled, DM2, now  improved after changing her diet. She will start exercise soon so I expect the premeal sugars to improve after this. She is on a regimen of basal insulin 60 units + also Januvia, metformin and Amaryl. Continue this. - I advised her to: Patient Instructions  Please continue: - Levemir 60 units at bedtime - Januvia 100 mg in am - Metformin 1000 mg at night  - Amaryl 4 mg 2x a day Please restart the Silver Sneakers program. Please stop at the lab. Please return in 3 months with your sugar log.  - refilled Levemir - check hbA1c today - Return to clinic in 3 months with her sugar log  Office Visit on 09/08/2013  Component Date Value Ref Range Status  . Microalb, Ur 09/08/2013 1.0  0.0 - 1.9 mg/dL Final  . Creatinine,U 09/08/2013 81.9   Final  . Microalb Creat Ratio 09/08/2013 1.2  0.0 - 30.0 mg/g Final  . Hemoglobin A1C 09/08/2013 9.7* 4.6 - 6.5 % Final   Glycemic Control Guidelines for People with Diabetes:Non Diabetic:  <6%Goal of Therapy: <7%Additional Action Suggested:  >8%   ACR normal  HbA1c much higher. If this does not significantly improve at next visit >> will start mealtime insulin.

## 2013-09-09 ENCOUNTER — Ambulatory Visit (INDEPENDENT_AMBULATORY_CARE_PROVIDER_SITE_OTHER): Payer: Medicare Other | Admitting: Family Medicine

## 2013-09-09 ENCOUNTER — Encounter: Payer: Self-pay | Admitting: Family Medicine

## 2013-09-09 VITALS — BP 122/72 | HR 82 | Ht 66.5 in | Wt 219.0 lb

## 2013-09-09 DIAGNOSIS — M7551 Bursitis of right shoulder: Secondary | ICD-10-CM

## 2013-09-09 DIAGNOSIS — M25511 Pain in right shoulder: Secondary | ICD-10-CM

## 2013-09-09 DIAGNOSIS — M1711 Unilateral primary osteoarthritis, right knee: Secondary | ICD-10-CM

## 2013-09-09 DIAGNOSIS — M25519 Pain in unspecified shoulder: Secondary | ICD-10-CM

## 2013-09-09 DIAGNOSIS — M67919 Unspecified disorder of synovium and tendon, unspecified shoulder: Secondary | ICD-10-CM

## 2013-09-09 DIAGNOSIS — M1710 Unilateral primary osteoarthritis, unspecified knee: Secondary | ICD-10-CM

## 2013-09-09 DIAGNOSIS — M719 Bursopathy, unspecified: Secondary | ICD-10-CM

## 2013-09-09 NOTE — Assessment & Plan Note (Signed)
The patient's knee pain is completely improved at this time. We will continue to monitor.

## 2013-09-09 NOTE — Progress Notes (Signed)
  [name] [address] [place], [place] [address] Phone: [phone] Subjective:     CC: Right shoulder and right knee pain follow up  [id number] [name] is a 68 y.o. female coming in with complaint of right shoulder and right knee pain  Regarding patient's right shoulder pain . Patient was seen previously and was diagnosed with subacromial bursitis as well as underlying osteophytic changes. Patient was given injection states that 70-75% of the pain is improved. Patient states that from time to time she has a dull aching pain with certain movements. Denies any radiation really down her arm but can feel some discomfort in her humerus. Denies any weakness. Not waking her up at night. Significantly better than prior to the injection..  Patient states that her right knee pain has completely resolved at this time.    Past medical history, social, surgical and family history all reviewed in electronic medical record.   Review of Systems: No headache, visual changes, nausea, vomiting, diarrhea, constipation, dizziness, abdominal pain, skin rash, fevers, chills, night sweats, weight loss, swollen lymph nodes, body aches, joint swelling, muscle aches, chest pain, shortness of breath, mood changes.   Objective Blood pressure 122/72, pulse 82, height 5' 6.5" (1.689 m), weight 219 lb (99.338 kg), SpO2 97.00%.  General: No apparent distress alert and oriented x3 mood and affect normal, dressed appropriately.  HEENT: Pupils equal, extraocular movements intact  Respiratory: Patient's speak in full sentences and does not appear short of breath  Cardiovascular: No lower extremity edema, non tender, no erythema  Skin: Warm dry intact with no signs of infection or rash on extremities or onTry the pennsaid  axial skeleton.  Abdomen: Soft nontender  Neuro: Cranial nerves II through XII are intact, neurovascularly intact in all extremities with 2+ DTRs and 2+  pulses.  Lymph: No lymphadenopathy of posterior or anterior cervical chain or axillae bilaterally.  Gait normal with good balance and coordination.  MSK:  Non tender with full range of motion and good stability and symmetric strength and tone of  elbows, wrist, hip, knee and ankles bilaterally.  Knee: Right Normal to inspection with no erythema or effusion or obvious bony abnormalities. Palpation normal with no warmth, joint line tenderness, patellar tenderness, or condyle tenderness. ROM full in flexion and extension and lower leg rotation. Ligaments with solid consistent endpoints including ACL, PCL, LCL, MCL. Negative Mcmurray's, Apley's, and Thessalonian tests. Non painful patellar compression. Patellar glide without crepitus. Patellar and quadriceps tendons unremarkable. Hamstring and quadriceps strength is normal.  Contralateral knee unremarkable    Shoulder: Right Inspection reveals no abnormalities, atrophy or asymmetry. Palpation is normal with no tenderness over AC joint or bicipital groove. ROM is full in all planes passively. Rotator cuff strength normal throughout. signs of impingement with positive Neer and Hawkin's tests, but negative empty can sign. Improved from previous exam Speeds and Yergason's tests normal. No labral pathology noted with negative Obrien's, negative clunk and good stability. Normal scapular function observed. No painful arc and no drop arm sign. No apprehension sign Mild crepitus on range of motion testing Contralateral shoulder unremarkable          Impression and Recommendations:     This case required medical decision making of moderate complexity.

## 2013-09-09 NOTE — Patient Instructions (Signed)
Good to see you You are doing great Ice is your friend Physical therapy will be calling you Exercises for your knee 2 times a week Take tylenol 325 mg three times a day is the best evidence based medicine we have for arthritis.  Glucosamine sulfate 750mg  twice a day is a supplement that has been shown to help moderate to severe arthritis. Vitamin D 2000 IU daily Fish oil 2 grams daily.  Tumeric 500mg  twice daily.  Come back in 4 weeks to make sure you continue to improve.

## 2013-09-09 NOTE — Assessment & Plan Note (Signed)
Patient likely has some mild bursitis still left but also has underlying arthritis. I believe patient is not doing the exercises on a regular basis and patient does confirm that. Patient is to be sent to formal physical therapy where I think this will be beneficial. Patient was given a prescription for a topical anti-inflammatory which I think would be helpful. Patient was shown phase II exercises that she will do at home until she is seen by formal physical therapy. Patient and will follow up with me again in 4-6 weeks for further evaluation and treatment.  Spent greater than 25 minutes with patient face-to-face and had greater than 50% of counseling including as described above in assessment and plan.

## 2013-09-14 LAB — HM DIABETES EYE EXAM

## 2013-09-20 ENCOUNTER — Encounter: Payer: Self-pay | Admitting: Internal Medicine

## 2013-09-20 ENCOUNTER — Ambulatory Visit: Payer: Medicare Other | Attending: Family Medicine | Admitting: Physical Therapy

## 2013-09-20 DIAGNOSIS — IMO0001 Reserved for inherently not codable concepts without codable children: Secondary | ICD-10-CM | POA: Insufficient documentation

## 2013-09-20 DIAGNOSIS — M25619 Stiffness of unspecified shoulder, not elsewhere classified: Secondary | ICD-10-CM | POA: Diagnosis not present

## 2013-09-20 DIAGNOSIS — M25519 Pain in unspecified shoulder: Secondary | ICD-10-CM | POA: Diagnosis not present

## 2013-09-21 ENCOUNTER — Other Ambulatory Visit: Payer: Self-pay | Admitting: Internal Medicine

## 2013-09-22 ENCOUNTER — Ambulatory Visit: Payer: Medicare Other | Admitting: Physical Therapy

## 2013-09-22 DIAGNOSIS — IMO0001 Reserved for inherently not codable concepts without codable children: Secondary | ICD-10-CM | POA: Diagnosis not present

## 2013-10-04 ENCOUNTER — Other Ambulatory Visit: Payer: Self-pay

## 2013-10-04 ENCOUNTER — Ambulatory Visit: Payer: Medicare Other | Admitting: Physical Therapy

## 2013-10-04 ENCOUNTER — Ambulatory Visit: Payer: Medicare Other | Admitting: Neurology

## 2013-10-04 ENCOUNTER — Telehealth: Payer: Self-pay | Admitting: Neurology

## 2013-10-04 DIAGNOSIS — IMO0001 Reserved for inherently not codable concepts without codable children: Secondary | ICD-10-CM | POA: Diagnosis not present

## 2013-10-04 MED ORDER — OMEPRAZOLE 20 MG PO CPDR: DELAYED_RELEASE_CAPSULE | ORAL | Status: DC

## 2013-10-07 ENCOUNTER — Encounter: Payer: Self-pay | Admitting: Family Medicine

## 2013-10-07 ENCOUNTER — Ambulatory Visit (INDEPENDENT_AMBULATORY_CARE_PROVIDER_SITE_OTHER)
Admission: RE | Admit: 2013-10-07 | Discharge: 2013-10-07 | Disposition: A | Discharge status: Home / Self Care | Payer: Medicare Other | Source: Ambulatory Visit | Attending: Family Medicine | Admitting: Family Medicine

## 2013-10-07 ENCOUNTER — Ambulatory Visit (INDEPENDENT_AMBULATORY_CARE_PROVIDER_SITE_OTHER): Payer: Medicare Other | Admitting: Family Medicine

## 2013-10-07 ENCOUNTER — Other Ambulatory Visit (INDEPENDENT_AMBULATORY_CARE_PROVIDER_SITE_OTHER): Payer: Medicare Other

## 2013-10-07 ENCOUNTER — Ambulatory Visit: Payer: Medicare Other | Attending: Family Medicine | Admitting: Physical Therapy

## 2013-10-07 VITALS — BP 132/82 | HR 84 | Ht 66.0 in | Wt 219.0 lb

## 2013-10-07 DIAGNOSIS — M25511 Pain in right shoulder: Secondary | ICD-10-CM

## 2013-10-07 DIAGNOSIS — M7501 Adhesive capsulitis of right shoulder: Secondary | ICD-10-CM

## 2013-10-07 DIAGNOSIS — Z5189 Encounter for other specified aftercare: Secondary | ICD-10-CM | POA: Insufficient documentation

## 2013-10-07 DIAGNOSIS — M25611 Stiffness of right shoulder, not elsewhere classified: Secondary | ICD-10-CM | POA: Insufficient documentation

## 2013-10-07 NOTE — Patient Instructions (Signed)
Good to see you Ice after exercises  A little concern you are getting frozen shoulder and we will watch Xray downstairs today Aleve when you need it.  See me again in 1 month to make sure you are doing well.

## 2013-10-07 NOTE — Progress Notes (Signed)
[name] [address] [place], [place] [address] Phone: [phone] Subjective:     CC: Right shoulder and right knee pain follow up  [id number] [name] is a 68 y.o. female coming in with complaint of right shoulder and right knee pain  Regarding patient's right shoulder pain . Patient was seen previously and was diagnosed with subacromial bursitis as well as underlying osteophytic changes. Patient had an injection in her shoulder greater than 6 weeks ago. Patient was only 70% better at that time so we did send patient to formal physical therapy. Patient continued to do conservative therapy including home exercises, icing protocol and over-the-counter medications. Patient states She is going to formal physical therapy and seems to be helping. Patient though still has a dull aching sensation and has noticed that she has lost some range of motion. Patient has been working on it diligently but continues to have some difficulty. Patient does have a past medical history significant for diabetes. Patient denies any radiation of pain or any numbness or tingling or any weakness of the arm to.   Past medical history, social, surgical and family history all reviewed in electronic medical record.   Review of Systems: No headache, visual changes, nausea, vomiting, diarrhea, constipation, dizziness, abdominal pain, skin rash, fevers, chills, night sweats, weight loss, swollen lymph nodes, body aches, joint swelling, muscle aches, chest pain, shortness of breath, mood changes.   Objective Blood pressure 132/82, pulse 84, height 5\' 6"  (1.676 m), weight 219 lb (99.338 kg), SpO2 96.00%.  General: No apparent distress alert and oriented x3 mood and affect normal, dressed appropriately.  HEENT: Pupils equal, extraocular movements intact  Respiratory: Patient's speak in full sentences and does not appear short of breath  Cardiovascular: No lower extremity edema, non  tender, no erythema  Skin: Warm dry intact with no signs of infection or rash on extremities or onTry the pennsaid  axial skeleton.  Abdomen: Soft nontender  Neuro: Cranial nerves II through XII are intact, neurovascularly intact in all extremities with 2+ DTRs and 2+ pulses.  Lymph: No lymphadenopathy of posterior or anterior cervical chain or axillae bilaterally.  Gait normal with good balance and coordination.  MSK:  Non tender with full range of motion and good stability and symmetric strength and tone of  elbows, wrist, hip, knee and ankles bilaterally.  Knee: Right Normal to inspection with no erythema or effusion or obvious bony abnormalities. Palpation normal with no warmth, joint line tenderness, patellar tenderness, or condyle tenderness. ROM full in flexion and extension and lower leg rotation. Ligaments with solid consistent endpoints including ACL, PCL, LCL, MCL. Negative Mcmurray's, Apley's, and Thessalonian tests. Non painful patellar compression. Patellar glide without crepitus. Patellar and quadriceps tendons unremarkable. Hamstring and quadriceps strength is normal.  Contralateral knee unremarkable    Shoulder: Right Inspection reveals no abnormalities, atrophy or asymmetry. Palpation is normal with no tenderness over AC joint or bicipital groove. ROM is just moderately restricted in forward flexion for proximal right 10. Patient also has internal rotation only to sacrum. This is decreased from previous exam. Rotator cuff strength normal throughout. signs of impingement with positive Neer and Hawkin's tests, but negative empty can sign. No change from previous exam Speeds and Yergason's tests normal. No labral pathology noted with negative Obrien's, negative clunk and good stability. Normal scapular function observed. No painful arc and no drop arm sign. No apprehension sign Mild crepitus on range of motion testing Contralateral shoulder unremarkable  Procedure:  Real-time Ultrasound Guided Injection of right glenohumeral joint Device: GE Logiq E  Ultrasound guided injection is preferred based studies that show increased duration, increased effect, greater accuracy, decreased procedural pain, increased response rate with ultrasound guided versus blind injection.  Verbal informed consent obtained.  Time-out conducted.  Noted no overlying erythema, induration, or other signs of local infection.  Skin prepped in a sterile fashion.  Local anesthesia: Topical Ethyl chloride.  With sterile technique and under real time ultrasound guidance:  Joint visualized.  23g 1  inch needle inserted posterior approach. Pictures taken for needle placement. Patient did have injection of 2 cc of 1% lidocaine, 2 cc of 0.5% Marcaine, and 1.0 cc of Kenalog 40 mg/dL. Completed without difficulty  Pain immediately resolved suggesting accurate placement of the medication.  Advised to call if fevers/chills, erythema, induration, drainage, or persistent bleeding.  Images permanently stored and available for review in the ultrasound unit.  Impression: Technically successful ultrasound guided injection.    Impression and Recommendations:     This case required medical decision making of moderate complexity.

## 2013-10-07 NOTE — Assessment & Plan Note (Signed)
The patient did have an injection today with increased range of motion. We discussed continuing to formal physical therapy for another 3 weeks. Patient will continue the icing protocol as well as a home exercises. Patient will come back again in 3-4 weeks for further evaluation. X-rays were ordered today. Patient continues to have pain we will consider further imaging such as an MRI. Patient agreed with plan and all questions were answered.  Spent greater than 25 minutes with patient face-to-face and had greater than 50% of counseling including as described above in assessment and plan.

## 2013-10-11 ENCOUNTER — Ambulatory Visit: Payer: Medicare Other | Admitting: Physical Therapy

## 2013-10-11 ENCOUNTER — Other Ambulatory Visit: Payer: Medicare Other

## 2013-10-11 MED ORDER — INSULIN DETEMIR 100 UNIT/ML FLEXPEN: PEN_INJECTOR | SUBCUTANEOUS | Status: DC

## 2013-10-13 ENCOUNTER — Ambulatory Visit: Payer: Medicare Other | Admitting: Rehabilitation

## 2013-10-13 DIAGNOSIS — Z5189 Encounter for other specified aftercare: Secondary | ICD-10-CM | POA: Diagnosis present

## 2013-10-13 DIAGNOSIS — M25611 Stiffness of right shoulder, not elsewhere classified: Secondary | ICD-10-CM | POA: Diagnosis not present

## 2013-10-13 DIAGNOSIS — M25511 Pain in right shoulder: Secondary | ICD-10-CM | POA: Diagnosis not present

## 2013-10-14 ENCOUNTER — Ambulatory Visit: Payer: Medicare Other | Admitting: Rehabilitation

## 2013-10-18 ENCOUNTER — Ambulatory Visit: Payer: Medicare Other | Admitting: Physical Therapy

## 2013-10-18 DIAGNOSIS — Z5189 Encounter for other specified aftercare: Secondary | ICD-10-CM | POA: Diagnosis not present

## 2013-10-21 ENCOUNTER — Ambulatory Visit: Payer: Medicare Other | Admitting: Rehabilitation

## 2013-10-25 ENCOUNTER — Ambulatory Visit: Payer: Medicare Other

## 2013-10-25 DIAGNOSIS — Z5189 Encounter for other specified aftercare: Secondary | ICD-10-CM | POA: Diagnosis not present

## 2013-10-28 ENCOUNTER — Ambulatory Visit: Payer: Medicare Other | Admitting: Rehabilitation

## 2013-10-28 DIAGNOSIS — Z5189 Encounter for other specified aftercare: Secondary | ICD-10-CM | POA: Diagnosis not present

## 2013-10-29 ENCOUNTER — Telehealth: Payer: Self-pay | Admitting: Internal Medicine

## 2013-10-29 NOTE — Telephone Encounter (Signed)
Tell her to try PT one more time and if pain then stop.  Take a break with the home exercises for the weekend and then start again on Monday.

## 2013-10-29 NOTE — Telephone Encounter (Signed)
Discussed with pt

## 2013-10-29 NOTE — Telephone Encounter (Signed)
Pt having pain following PT yesterday. Pt states it now hurts to exercise. Pt wants to know if she should continue with exercises, change exercises or discontinue? Pt is does have appt 11/10. Please advise. [phone]

## 2013-10-31 ENCOUNTER — Encounter: Payer: Self-pay | Admitting: Family Medicine

## 2013-11-01 ENCOUNTER — Telehealth: Payer: Self-pay | Admitting: Internal Medicine

## 2013-11-01 ENCOUNTER — Ambulatory Visit (INDEPENDENT_AMBULATORY_CARE_PROVIDER_SITE_OTHER): Payer: Medicare Other | Admitting: Family Medicine

## 2013-11-01 ENCOUNTER — Encounter: Payer: Self-pay | Admitting: Family Medicine

## 2013-11-01 VITALS — BP 138/86 | HR 78 | Ht 66.5 in | Wt 218.0 lb

## 2013-11-01 DIAGNOSIS — M7501 Adhesive capsulitis of right shoulder: Secondary | ICD-10-CM

## 2013-11-01 DIAGNOSIS — M25511 Pain in right shoulder: Secondary | ICD-10-CM

## 2013-11-01 NOTE — Patient Instructions (Signed)
Good to see you Ice 20 minutes 2 times a day Hold on exercises and physical therapy We will get MRI  Come 1-2 days after MRI and we will discuss it.

## 2013-11-01 NOTE — Assessment & Plan Note (Signed)
Patient was given another corticosteroid injection quick succession at this time. Patient's does have some underlying moderate osteophytic changes of the shoulder as well. This might be contributing to some of her pain the patient does not have any significant weakness of the rotator cuff itself. Patient though is having enough problems and this is affecting her daily activities. Patient is going out of town but I do want to schedule an MRI for further evaluation. Patient is going to have the MRI and come back 1-2 days afterwards. In the interim patient will hold on formal physical therapy until she returns. Patient will then continue the home exercises starting in 48 hours. We will see patient back after imaging studies.  Spent greater than 25 minutes with patient face-to-face and had greater than 50% of counseling including as described above in assessment and plan.

## 2013-11-01 NOTE — Telephone Encounter (Signed)
Yes she can, ask if she is having the throbbing.

## 2013-11-01 NOTE — Telephone Encounter (Signed)
Spoke to pt & told her she could take aleve, she stated that her arm was feeling better.

## 2013-11-01 NOTE — Progress Notes (Signed)
[name] [address] [place], [place] [address] Phone: [phone] Subjective:     CC: Right shoulder follow up  [id number] [place] is a 68 y.o. female coming in with complaint of right shoulder and right knee pain  Regarding patient's right shoulder pain . Patient was seen previously and was diagnosed with subacromial bursitis as well as underlying osteophytic changes. 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 Patient was given an injection and states that she had about 2 weeks of improvement and then started having worsening pain again. Patient is also noticed some mild decrease in range of motion as well. States that the pain is waking her up at night. Mild radiation down half of her humerus but not past the elbow. Finding it somewhat difficult to do some daily activities. Patient states he over-the-counter medicines do not help the pain.   Past medical history, social, surgical and family history all reviewed in electronic medical record.   Review of Systems: No headache, visual changes, nausea, vomiting, diarrhea, constipation, dizziness, abdominal pain, skin rash, fevers, chills, night sweats, weight loss, swollen lymph nodes, body aches, joint swelling, muscle aches, chest pain, shortness of breath, mood changes.   Objective Blood pressure 138/86, pulse 78, height 5' 6.5" (1.689 m), weight 218 lb (98.884 kg), SpO2 95.00%.  General: No apparent distress alert and oriented x3 mood and affect normal, dressed appropriately.  HEENT: Pupils equal, extraocular movements intact  Respiratory: Patient's speak in full sentences and does not appear short of breath  Cardiovascular: No lower extremity edema, non tender, no erythema  Skin: Warm dry intact with no signs of infection or rash on extremities or onTry the pennsaid  axial skeleton.  Abdomen: Soft nontender  Neuro: Cranial nerves II through XII are intact,  neurovascularly intact in all extremities with 2+ DTRs and 2+ pulses.  Lymph: No lymphadenopathy of posterior or anterior cervical chain or axillae bilaterally.  Gait normal with good balance and coordination.  MSK:  Non tender with full range of motion and good stability and symmetric strength and tone of  elbows, wrist, hip, knee and ankles bilaterally.    Shoulder: Right Inspection reveals no abnormalities, atrophy or asymmetry. Palpation is normal with no tenderness over AC joint or bicipital groove. ROM is just moderately restricted in forward flexion for lacking the last 10 of forward flexion. Patient also has internal rotation only to sacrum. Patient also has a lacking the last 5 of external range of motion which is new.  Rotator cuff strength normal throughout. signs of impingement with positive Neer and Hawkin's tests, but negative empty can sign. No change from previous exam Speeds and Yergason's tests normal. No labral pathology noted with negative Obrien's, negative clunk and good stability. Normal scapular function observed. No painful arc and no drop arm sign. No apprehension sign Mild crepitus on range of motion testing Contralateral shoulder unremarkable  Procedure: Real-time Ultrasound Guided Injection of right glenohumeral joint Device: GE Logiq E  Ultrasound guided injection is preferred based studies that show increased duration, increased effect, greater accuracy, decreased procedural pain, increased response rate with ultrasound guided versus blind injection.  Verbal informed consent obtained.  Time-out conducted.  Noted no overlying erythema, induration, or other signs of local infection.  Skin prepped in a sterile fashion.  Local anesthesia: Topical Ethyl chloride.  With sterile technique and under real time ultrasound guidance:  Joint visualized.  23g 1  inch  needle inserted posterior approach. Pictures taken for needle placement. Patient did have injection of  2 cc of 1% lidocaine, 2 cc of 0.5% Marcaine, and 1.0 cc of Kenalog 40 mg/dL. Completed without difficulty  Pain immediately resolved suggesting accurate placement of the medication.  Advised to call if fevers/chills, erythema, induration, drainage, or persistent bleeding.  Images permanently stored and available for review in the ultrasound unit.  Impression: Technically successful ultrasound guided injection.    Impression and Recommendations:     This case required medical decision making of moderate complexity.

## 2013-11-01 NOTE — Telephone Encounter (Signed)
Pt had steroid infection, can she take aleve for the throbbing in her upper arm? Please advise

## 2013-11-02 ENCOUNTER — Telehealth: Payer: Self-pay | Admitting: Internal Medicine

## 2013-11-02 ENCOUNTER — Ambulatory Visit: Payer: Medicare Other | Admitting: Rehabilitation

## 2013-11-02 NOTE — Telephone Encounter (Signed)
Spoke to pt, scheduled appt with dr. Tamala Julian tomorrow @ 11:45a.

## 2013-11-02 NOTE — Telephone Encounter (Signed)
Yes take aleve again If opening she can come in tomorrow but likely will improve.

## 2013-11-02 NOTE — Telephone Encounter (Signed)
Pt states R arm is back to throbbing again. The last aleve she took was this am about 8. Pt wants to know if she needs to come see him for this throbbing or continue with the aleve? Please advise

## 2013-11-03 ENCOUNTER — Encounter: Payer: Self-pay | Admitting: Family Medicine

## 2013-11-03 ENCOUNTER — Ambulatory Visit (INDEPENDENT_AMBULATORY_CARE_PROVIDER_SITE_OTHER): Payer: Medicare Other | Admitting: Family Medicine

## 2013-11-03 ENCOUNTER — Ambulatory Visit (INDEPENDENT_AMBULATORY_CARE_PROVIDER_SITE_OTHER)
Admission: RE | Admit: 2013-11-03 | Discharge: 2013-11-03 | Disposition: A | Discharge status: Home / Self Care | Payer: Medicare Other | Source: Ambulatory Visit | Attending: Family Medicine | Admitting: Family Medicine

## 2013-11-03 VITALS — BP 112/70 | HR 75 | Temp 98.4°F | Ht 66.5 in | Wt 218.0 lb

## 2013-11-03 DIAGNOSIS — M7501 Adhesive capsulitis of right shoulder: Secondary | ICD-10-CM

## 2013-11-03 DIAGNOSIS — M542 Cervicalgia: Secondary | ICD-10-CM

## 2013-11-03 MED ORDER — DOXYCYCLINE HYCLATE 100 MG PO TABS: 100.0000 mg | ORAL_TABLET | Freq: Two times a day (BID) | ORAL | Status: AC

## 2013-11-03 MED ORDER — KETOROLAC TROMETHAMINE 60 MG/2ML IM SOLN
60.0000 mg | Freq: Once | INTRAMUSCULAR | Status: AC
  Administered 2013-11-03: 60 mg via INTRAMUSCULAR

## 2013-11-03 MED ORDER — PREDNISONE 50 MG PO TABS: 50.0000 mg | ORAL_TABLET | Freq: Every day | ORAL | Status: DC

## 2013-11-03 NOTE — Assessment & Plan Note (Signed)
Patient continues to have difficulty and I am thinking patient may have a labral tear. We are did have an MRI pending at this time. Patient is also going to go to formal physical therapy but unfortunate she is going out of town for the next week. I do feel that patient is having a flare and we will put her on prednisone daily for the next 5 days. Patient was warned that this can increase her blood sugar and we will adjust accordingly. In addition to this patient was given doxycycline if there is any signs of infection but patient's temperature is normal and no redness of the overlying skin. Patient will take these medications as well as do icing and come back in 1 week. X-rays of neck ordered today as well.

## 2013-11-03 NOTE — Patient Instructions (Addendum)
Good to see you Prednisone daily for next 5 days, stop the aleve while on the predniosne.  Levemir 3-5 units more daily while on the levemir.  Ice still is your friend.  Doxycycline for next week twice daily.   Xray downstairs today.  See me again late next week to make sure you are better or after MRI.

## 2013-11-03 NOTE — Addendum Note (Signed)
Addended by: Douglass Rivers T on: 11/03/2013 01:51 PM   Modules accepted: Orders

## 2013-11-03 NOTE — Progress Notes (Signed)
  [name] [place], [place] [address] Phone: [phone] Subjective:     CC: Right shoulder follow up  [id number] Jackpot is a 68 y.o. female coming in with complaint of right shoulder and right knee pain  Regarding patient's right shoulder pain . Patient was seen previously and was diagnosed with subacromial bursitis as well as underlying osteophytic changes. 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 Patient was just seen last week and had another crit a steroid injection. Unfortunate patient continues to have a flare even though patient is now 4 days out from previous injection. Patient discusses it as a dull throbbing aching sensation. States that it feels very similar to what it did prior to this injection and a little bit worse. Patient states that sometimes she can Feeling that is going down into her hand. Patient describes his pain as 9 out of 10 it is waking her up at night.   Past medical history, social, surgical and family history all reviewed in electronic medical record.   Review of Systems: No headache, visual changes, nausea, vomiting, diarrhea, constipation, dizziness, abdominal pain, skin rash, fevers, chills, night sweats, weight loss, swollen lymph nodes, body aches, joint swelling, muscle aches, chest pain, shortness of breath, mood changes.   Objective Blood pressure 112/70, pulse 75, temperature 98.4 F (36.9 C), temperature source Oral, height 5' 6.5" (1.689 m), weight 218 lb (98.884 kg), SpO2 94.00%.  General: No apparent distress alert and oriented x3 mood and affect normal, dressed appropriately.  HEENT: Pupils equal, extraocular movements intact  Respiratory: Patient's speak in full sentences and does not appear short of breath  Cardiovascular: No lower extremity edema, non tender, no erythema  Skin: Warm dry intact with no signs of infection or rash on extremities or onTry  the pennsaid  axial skeleton.  Abdomen: Soft nontender  Neuro: Cranial nerves II through XII are intact, neurovascularly intact in all extremities with 2+ DTRs and 2+ pulses.  Lymph: No lymphadenopathy of posterior or anterior cervical chain or axillae bilaterally.  Gait normal with good balance and coordination.  MSK:  Non tender with full range of motion and good stability and symmetric strength and tone of  elbows, wrist, hip, knee and ankles bilaterally.  Neck exam shows the patient does have some discomfort but no radicular symptoms with a negative Spurling's. Mild limitation in range of motion especially with side bending bilaterally right greater than left.   Shoulder: Right Inspection reveals no abnormalities, atrophy or asymmetry. No signs of infection. Palpation is normal with no tenderness over AC joint or bicipital groove. ROM is just moderately restricted in forward flexion for lacking the last 10 of forward flexion. Patient also has internal rotation only to sacrum. Patient also has a lacking the last 5 of external range of motion which is new.  Rotator cuff strength normal throughout. signs of impingement with positive Neer and Hawkin's tests, but negative empty can sign. No change from previous exam Speeds and Yergason's tests normal. Questionable early positive O'Brien's. Normal scapular function observed. No painful arc and no drop arm sign. No apprehension sign Mild crepitus on range of motion testing, patient is more in pain than previous exam. Contralateral shoulder unremarkable    Impression and Recommendations:     This case required medical decision making of moderate complexity.

## 2013-11-08 ENCOUNTER — Ambulatory Visit: Payer: Medicare Other

## 2013-11-08 ENCOUNTER — Ambulatory Visit: Payer: Medicare Other | Admitting: Family Medicine

## 2013-11-09 ENCOUNTER — Encounter: Payer: Self-pay | Admitting: Family Medicine

## 2013-11-10 ENCOUNTER — Ambulatory Visit (INDEPENDENT_AMBULATORY_CARE_PROVIDER_SITE_OTHER): Payer: Medicare Other | Admitting: Family Medicine

## 2013-11-10 ENCOUNTER — Encounter: Payer: Self-pay | Admitting: Family Medicine

## 2013-11-10 ENCOUNTER — Other Ambulatory Visit (INDEPENDENT_AMBULATORY_CARE_PROVIDER_SITE_OTHER): Payer: Medicare Other

## 2013-11-10 VITALS — BP 132/72 | HR 115 | Ht 66.5 in | Wt 217.0 lb

## 2013-11-10 DIAGNOSIS — M25511 Pain in right shoulder: Secondary | ICD-10-CM

## 2013-11-10 DIAGNOSIS — M5412 Radiculopathy, cervical region: Secondary | ICD-10-CM

## 2013-11-10 DIAGNOSIS — M7501 Adhesive capsulitis of right shoulder: Secondary | ICD-10-CM

## 2013-11-10 MED ORDER — HYDROCODONE-ACETAMINOPHEN 7.5-325 MG PO TABS: 1.0000 | ORAL_TABLET | Freq: Three times a day (TID) | ORAL | Status: DC | PRN

## 2013-11-10 MED ORDER — GABAPENTIN 100 MG PO CAPS: 100.0000 mg | ORAL_CAPSULE | Freq: Every day | ORAL | Status: DC

## 2013-11-10 NOTE — Assessment & Plan Note (Signed)
Patient has had frozen shoulder previously and did have to steroid injections but now is unfortunately having worsening pain. Patient does give a history of having pain after a physical therapy incident then made the pain significantly worse. Patient does have significant osteophytic changes on x-ray that is also contributing. On ultrasound today specifically new findings noted such as a rotator cuff tear but I do feel that MRI is necessary with her having more of a weakness as well as a positive O'Briens that could be concerning for labral tear. Order is placed. At this time I do think that the differential also includes cervical radiculopathy with a positive Spurling's test. Patient is having some weakness of the upper extremity but reflexes are normal at this time.we will have patient come back after the MRI to discuss findings with treatment options are available.  Spent greater than 25 minutes with patient face-to-face and had greater than 50% of counseling including as described above in assessment and plan.

## 2013-11-10 NOTE — Patient Instructions (Addendum)
I am glad you had a great trip.  Gabapentin 100mg  at night We will get MRI of neck and shoulder, come back 1-2 days after MRI and we will discuss. Continue the ice, stop exercises.  Cancel PT until we see what going on.

## 2013-11-10 NOTE — Assessment & Plan Note (Signed)
Patient is having pain with radiation down her arm as well as weakness of the upper extremity. We did notice some weakness of the rotator cuff the patient does have a positive Spurling's which does keep nerve root impingement especially at C4 or C5 level as a possibility. Patient was started on gabapentin but I do feel that further imaging is necessary due to the chronicity of the problem in the worsening of the pain. Patient will continue all other medications in patient was given range of motion exercises. Patient come back after MRI and we'll discuss findings.

## 2013-11-10 NOTE — Progress Notes (Signed)
[name] [place], [place] [address] Phone: [phone] Subjective:     CC: Right shoulder follow up  [id number] [name] is a 68 y.o. female coming in with complaint of right shoulder and right knee pain  Regarding patient's right shoulder pain . Patient was seen previously and was diagnosed with subacromial bursitis as well as underlying osteophytic changes. 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 Patient was just seen last week and had another steroid injection without any improvement. Patient continues to have an aching pain that is now radiating down her arm. We did get x-rays of her neck that does show diffuse osteoarthritic changes. We are awaiting approval for MRI of cervical spine as well as shoulder for further evaluation. Patient has noticed some mild weakness of the upper extremity. Patient states that it is affecting all daily activities as well as waking her up at night. Patient is  having to take pain medication to help her sleep at night.   Past medical history, social, surgical and family history all reviewed in electronic medical record.   Review of Systems: No headache, visual changes, nausea, vomiting, diarrhea, constipation, dizziness, abdominal pain, skin rash, fevers, chills, night sweats, weight loss, swollen lymph nodes, body aches, joint swelling, muscle aches, chest pain, shortness of breath, mood changes.   Objective Blood pressure 132/72, pulse 115, height 5' 6.5" (1.689 m), weight 217 lb (98.431 kg), SpO2 99 %.  General: No apparent distress alert and oriented x3 mood and affect normal, dressed appropriately.  HEENT: Pupils equal, extraocular movements intact  Respiratory: Patient's speak in full sentences and does not appear short of breath  Cardiovascular: No lower extremity edema, non tender, no erythema  Skin: Warm dry intact with no signs of infection or rash on  extremities or onTry the pennsaid  axial skeleton.  Abdomen: Soft nontender  Neuro: Cranial nerves II through XII are intact, neurovascularly intact in all extremities with 2+ DTRs and 2+ pulses.  Lymph: No lymphadenopathy of posterior or anterior cervical chain or axillae bilaterally.  Gait normal with good balance and coordination.  MSK:  Non tender with full range of motion and good stability and symmetric strength and tone of  elbows, wrist, hip, knee and ankles bilaterally.   Neck: Inspection unremarkable. No palpable stepoffs. Positive Spurling's maneuver. Full neck range of motion Grip strength and sensation normal in bilateral hands Strength good C4 to T1 distribution No sensory change to C4 to T1 Negative Hoffman sign bilaterally Reflexes normal  Previous surgery she cervical degenerative disc disease with bilateral neural foraminal stenosis most severe at C4-5, C5-6.  MSK US performed of: right shoulder This study was ordered, performed, and interpreted by Charlann Boxer D.O.  Shoulder:   Supraspinatus:  Appears normal on long and transverse views, no bursal bulge seen with shoulder abduction on impingement view.patient does have underlying chronic degenerative changes Infraspinatus:  Appears normal on long and transverse views.chronic degenerative changes noted with questionable early tear but no significant hypoechoic changes. No retraction noted. Subscapularis:  Appears normal on long and transverse views.resolved bursitis improving capsulitis Teres Minor:  Appears normal on long and transverse views. AC joint:  Capsule undistended, no geyser sign. Glenohumeral Joint:  Moderate to severe osteophytic changes. Glenoid Labrum:  Intact without visualized tears. Biceps Tendon:  Appears normal on long and transverse views, no fraying of tendon, tendon located in intertubercular groove, no subluxation with shoulder internal  or external rotation. No increased power doppler  signal. Impression: Resolved bursitis with continued osteophytic changes with no significant changes in the rotator cuff itself.  Shoulder: Right Inspection reveals no abnormalities, atrophy or asymmetry. No signs of infection. Palpation is normal with no tenderness over AC joint or bicipital groove. ROM is just moderately restricted in forward flexion for lacking the last 10 of forward flexion. Patient also has internal rotation only to sacrum. Patient also has a lacking the last 5 of external range of motion which is new.  Rotator cuff strength is now 4-5 which is a new finding signs of impingement with positive Neer and Hawkin's tests, but negative empty can sign. No change from previous exam Speeds and Yergason's tests normal. positive O'Brien's. Normal scapular function observed. Positive painful arc No apprehension sign Mild crepitus on range of motion testing, patient is more in pain than previous exam. Contralateral shoulder unremarkable    Impression and Recommendations:     This case required medical decision making of moderate complexity.

## 2013-11-11 ENCOUNTER — Ambulatory Visit: Payer: Medicare Other

## 2013-11-11 ENCOUNTER — Encounter: Payer: Self-pay | Admitting: Family Medicine

## 2013-11-12 ENCOUNTER — Other Ambulatory Visit: Payer: Self-pay | Admitting: Neurology

## 2013-11-12 DIAGNOSIS — R413 Other amnesia: Secondary | ICD-10-CM

## 2013-11-15 ENCOUNTER — Ambulatory Visit: Payer: Medicare Other

## 2013-11-15 ENCOUNTER — Telehealth: Payer: Self-pay | Admitting: *Deleted

## 2013-11-15 NOTE — Telephone Encounter (Signed)
Received medical record request received via fax from [facility]. Forms forwarded to health port. JG//CMA

## 2013-11-16 ENCOUNTER — Ambulatory Visit: Payer: Medicare Other | Admitting: Family Medicine

## 2013-11-18 ENCOUNTER — Telehealth: Payer: Self-pay | Admitting: *Deleted

## 2013-11-18 ENCOUNTER — Ambulatory Visit: Payer: Medicare Other | Admitting: Rehabilitation

## 2013-11-18 NOTE — Telephone Encounter (Signed)
Received medical record request via fax from [facility]. Form forwarded to [facility]. JG//CMA

## 2013-11-19 ENCOUNTER — Ambulatory Visit
Admission: RE | Admit: 2013-11-19 | Discharge: 2013-11-19 | Disposition: A | Discharge status: Home / Self Care | Payer: Medicare Other | Source: Ambulatory Visit | Attending: Family Medicine | Admitting: Family Medicine

## 2013-11-19 DIAGNOSIS — M5412 Radiculopathy, cervical region: Secondary | ICD-10-CM

## 2013-11-19 DIAGNOSIS — M25511 Pain in right shoulder: Secondary | ICD-10-CM

## 2013-11-19 LAB — BASIC METABOLIC PANEL
BUN: 7 mg/dL (ref 4–21)
Creatinine: 0.8 mg/dL (ref <=1.1)
Glucose: 138 mg/dL
Potassium: 4.5 mmol/L (ref 3.4–5.3)
Sodium: 135 mmol/L — AB (ref 137–147)

## 2013-11-19 LAB — LIPID PANEL
Cholesterol: 157 mg/dL (ref 0–200)
HDL: 56 mg/dL (ref 35–70)
LDL Cholesterol: 69 mg/dL
LDl/HDL Ratio: 2.8
Triglycerides: 159 mg/dL (ref 40–160)

## 2013-11-19 LAB — HEPATIC FUNCTION PANEL
ALT: 33 U/L (ref 7–35)
AST: 23 U/L (ref 13–35)
Alkaline Phosphatase: 64 U/L (ref 25–125)
Bilirubin, Total: 0.8 mg/dL

## 2013-11-21 ENCOUNTER — Other Ambulatory Visit: Payer: Self-pay | Admitting: Internal Medicine

## 2013-11-22 ENCOUNTER — Other Ambulatory Visit: Payer: Medicare Other

## 2013-11-22 ENCOUNTER — Ambulatory Visit: Payer: Medicare Other

## 2013-11-22 ENCOUNTER — Ambulatory Visit
Admission: RE | Admit: 2013-11-22 | Discharge: 2013-11-22 | Disposition: A | Discharge status: Home / Self Care | Payer: Medicare Other | Source: Ambulatory Visit | Attending: Family Medicine | Admitting: Family Medicine

## 2013-11-24 ENCOUNTER — Ambulatory Visit (INDEPENDENT_AMBULATORY_CARE_PROVIDER_SITE_OTHER): Payer: Medicare Other | Admitting: Family Medicine

## 2013-11-24 ENCOUNTER — Encounter: Payer: Self-pay | Admitting: Family Medicine

## 2013-11-24 VITALS — BP 110/80 | HR 87 | Ht 66.5 in | Wt 218.0 lb

## 2013-11-24 DIAGNOSIS — M5412 Radiculopathy, cervical region: Secondary | ICD-10-CM

## 2013-11-24 DIAGNOSIS — M25511 Pain in right shoulder: Secondary | ICD-10-CM

## 2013-11-24 DIAGNOSIS — M19011 Primary osteoarthritis, right shoulder: Secondary | ICD-10-CM

## 2013-11-24 MED ORDER — TRAMADOL HCL 50 MG PO TABS: 50.0000 mg | ORAL_TABLET | Freq: Every evening | ORAL | Status: DC | PRN

## 2013-11-24 NOTE — Progress Notes (Signed)
[name] [place], [name] [address] Phone: [phone] Subjective:     CC: Right shoulder follow up  [id number] [name] is a 68 y.o. female coming in with complaint of right shoulder and right knee pain  Regarding patient's right shoulder pain . Patient was seen previously and was diagnosed with subacromial bursitis as well as underlying osteophytic changes degenerative tear of rotator cuff on ultrasound but no true full thickness tear.   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 Patient was sent to formal physical therapy and did not make any significant improvement. Patient unfortunately continued to have pain even after that actually had some new weakness. We attempted another cortical steroid injection with no significant resolution of pain. Further imaging was ordered. MRI patient's shoulder and neck were done and patient is here to go over the findings and further evaluation and treatment options. Patient at this time is taking hydrocodone 1-2 times daily, topical anti-inflammatories, and is no longer taking tramadol. States that it is sore and does stop her from some activity. States that it can come and go though. MRI of right shoulder shows.  1. Severe glenohumeral osteoarthritis. 2. Near complete SUPRASPINATUS tendon tear at the insertion. A fewcentral and posterior bursal surface fibers remain intact. Maximal retraction is between 3 cm and 5 cm. 3. Large glenohumeral effusion with debris/synovitis in the superior SUBSCAPULARIS recess. 4. Mild SUPRASPINATUS muscular atrophy. 5. Abnormal marrow signal in the proximal humerus which may represent an atypical appearance of reactivation red marrow however a hematologic malignancy cannot be excluded. 6. Thickening and edema of the axillary pouch which could beassociated with adhesive capsulitis in the appropriate clinicalsetting.  MRI of neck shows  patient does have right-sided neuronal impingement at C4-C7.    Past medical history, social, surgical and family history all reviewed in electronic medical record.   Review of Systems: No headache, visual changes, nausea, vomiting, diarrhea, constipation, dizziness, abdominal pain, skin rash, fevers, chills, night sweats, weight loss, swollen lymph nodes, body aches, joint swelling, muscle aches, chest pain, shortness of breath, mood changes.   Objective Blood pressure 110/80, pulse 87, height 5' 6.5" (1.689 m), weight 218 lb (98.884 kg), SpO2 95 %.  General: No apparent distress alert and oriented x3 mood and affect normal, dressed appropriately.  HEENT: Pupils equal, extraocular movements intact  Respiratory: Patient's speak in full sentences and does not appear short of breath  Cardiovascular: No lower extremity edema, non tender, no erythema  Skin: Warm dry intact with no signs of infection or rash on extremities or onTry the pennsaid  axial skeleton.  Abdomen: Soft nontender  Neuro: Cranial nerves II through XII are intact, neurovascularly intact in all extremities with 2+ DTRs and 2+ pulses.  Lymph: No lymphadenopathy of posterior or anterior cervical chain or axillae bilaterally.  Gait normal with good balance and coordination.  MSK:  Non tender with full range of motion and good stability and symmetric strength and tone of  elbows, wrist, hip, knee and ankles bilaterally.   Neck: Inspection unremarkable. No palpable stepoffs. Negative Spurling's today Full neck range of motion Grip strength and sensation normal in bilateral hands Strength good C4 to T1 distribution No sensory change to C4 to T1 Negative Hoffman sign bilaterally Reflexes normal    Shoulder: Right Inspection reveals no abnormalities, atrophy or asymmetry. No signs of infection. Palpation is normal with no tenderness over AC joint or bicipital  groove. ROM is just moderately restricted in forward flexion for  lacking the last 8 of forward flexion. Patient also has internal rotation only to sacrum. Patient also has a lacking the last 5 of external range of motion   Rotator cuff strength is now 4/5  Speeds and Yergason's tests normal. positive O'Brien's. Normal scapular function observed. Positive painful arc No apprehension sign Mild crepitus on range of motion testing, patient is more in pain than previous exam still Contralateral shoulder unremarkable    Impression and Recommendations:     This case required medical decision making of moderate complexity.

## 2013-11-24 NOTE — Assessment & Plan Note (Signed)
Patient's MRI shows that cervical radiculopathy could be contributing to some of patient's right shoulder pain. We discussed the possibility of an epidural steroid injection to rule out any cervical pathology. Patient would like to discuss with the orthopedic surgeon about the shoulder first and if anything can be done. Depending on those findings patient did well either elect to have the epidural steroid injection or continue with the conservative therapy. Patient will follow-up if I can be of any help.

## 2013-11-24 NOTE — Assessment & Plan Note (Signed)
Patient has had significant and dense been of the arthritis of the shoulder since previous imaging. I do believe the patient's other comorbidities can be contributing. Patient also has a new rotator cuff tear with some mild retraction. Due to the amount of arthritis though patient is aware that rotator cuff repair is likely not possible. Patient would like to talk to a specialist about what her options are with the findings on MRI. Discussed with patient I will refer her to an orthopedic surgeon. In the interim have patient continue the icing, home exercises and the medications we've discussed previously. Patient knows I'm here for any questions.

## 2013-11-24 NOTE — Patient Instructions (Signed)
Good to see you Ice is your friend after activity and heat before activity.  Continue the pennsaid topically as needed Tramadol for breakthrough pain Tylenol 500-650mg  3 times daily is best evidence for pain.  Aleve up to 2 times daily.  Continue the vitamin D  Dr. Tamera Punt will be calling you

## 2013-11-25 ENCOUNTER — Ambulatory Visit: Payer: Medicare Other

## 2013-11-25 ENCOUNTER — Ambulatory Visit
Admission: RE | Admit: 2013-11-25 | Discharge: 2013-11-25 | Disposition: A | Discharge status: Home / Self Care | Payer: Medicare Other | Source: Ambulatory Visit | Attending: Neurology | Admitting: Neurology

## 2013-11-25 DIAGNOSIS — R413 Other amnesia: Secondary | ICD-10-CM

## 2013-11-26 ENCOUNTER — Ambulatory Visit (INDEPENDENT_AMBULATORY_CARE_PROVIDER_SITE_OTHER): Payer: Medicare Other | Admitting: Internal Medicine

## 2013-11-26 ENCOUNTER — Encounter: Payer: Self-pay | Admitting: Internal Medicine

## 2013-11-26 VITALS — BP 132/68 | HR 84 | Temp 98.3°F | Wt 217.5 lb

## 2013-11-26 DIAGNOSIS — R936 Abnormal findings on diagnostic imaging of limbs: Secondary | ICD-10-CM

## 2013-11-26 DIAGNOSIS — R938 Abnormal findings on diagnostic imaging of other specified body structures: Secondary | ICD-10-CM

## 2013-11-26 DIAGNOSIS — M19011 Primary osteoarthritis, right shoulder: Secondary | ICD-10-CM

## 2013-11-26 NOTE — Progress Notes (Signed)
Pre visit review using our clinic review tool, if applicable. No additional management support is needed unless otherwise documented below in the visit note. 

## 2013-11-26 NOTE — Patient Instructions (Addendum)
See you next month!

## 2013-11-26 NOTE — Progress Notes (Signed)
Subjective:    Patient ID: [name], female    DOB: February 11, 1945, 68 y.o.   MRN: [id number]  DOS:  11/26/2013 Type of visit - description : Here to discuss the recent MRI Interval history: Shoulder DJD, had a MRI, it was abnormal, patient concerned. Wonders if is a good idea having a R shoulder replacement , an option per her  orthopedic surgeon. Med list reviewed, good compliance.    ROS Denies fever, chills, night sweats or weight loss. No nausea, vomiting, diarrhea. Still having fatigue.   Past Medical History  Diagnosis Date  . Diabetes mellitus   . Depression     sees Dr.Cotle  . Hyperlipemia   . Hypertension   . IBS (irritable bowel syndrome)     and dyspepsia  . RLS (restless legs syndrome)   . Adenomatous colon polyp 03/1988  . Osteopenia     dexa 06/2007 and 10/11 Rx CA vitamin d  . Chest pain      Sept 2011: stress test neg  . Diverticulosis   . Fatty liver     Increased LFTs, saw GI 06/2011, likely from fatty liver     Past Surgical History  Procedure Laterality Date  . Uterine fibroid embolization      90s  . Tonsillectomy      History   Social History  . Marital Status: Married    Spouse Name: [name]    Number of Children: 3  . Years of Education: N/A   Occupational History  . retired, was a Leisure centre manager)     Therapist, sports  .     Social History Main Topics  . Smoking status: Former Smoker -- 2.00 packs/day for 10 years    Types: Cigarettes    Quit date: 11/26/1975  . Smokeless tobacco: Never Used     Comment: used to smoke 2 ppd  . Alcohol Use: Yes     Comment: rarely  . Drug Use: No  . Sexual Activity: Not on file   Other Topics Concern  . Not on file   Social History Narrative   2 g-child        Medication List       This list is accurate as of: 11/26/13 11:59 PM.  Always use your most recent med list.               atenolol 50 MG tablet  Commonly known as:  TENORMIN  TAKE 1 TABLET DAILY     atorvastatin 40  MG tablet  Commonly known as:  LIPITOR  Take 40 mg by mouth daily.     B-D ULTRAFINE III SHORT PEN 31G X 8 MM Misc  Generic drug:  Insulin Pen Needle  USE AS DIRECTED     clonazePAM 0.5 MG tablet  Commonly known as:  KLONOPIN  Take by mouth. As needed     Diclofenac Sodium 2 % Soln  Apply twice daily.     DULoxetine 60 MG capsule  Commonly known as:  CYMBALTA  Take 60 mg by mouth daily.     gabapentin 100 MG capsule  Commonly known as:  NEURONTIN  Take 1 capsule (100 mg total) by mouth at bedtime.     glimepiride 4 MG tablet  Commonly known as:  AMARYL  TAKE 1 TABLET (4 MG TOTAL) BY MOUTH 2 (TWO) TIMES DAILY BEFORE A MEAL.     glucose blood test strip  Commonly known as:  ONE TOUCH ULTRA TEST  Check twice a day     HYDROcodone-acetaminophen 7.5-325 MG per tablet  Commonly known as:  NORCO  Take 1 tablet by mouth every 8 (eight) hours as needed for moderate pain (cough).     Insulin Detemir 100 UNIT/ML Pen  Commonly known as:  LEVEMIR FLEXTOUCH  INJECT 0.5 ML INTO SKIN DAILY AS DIRECTED     JANUVIA 100 MG tablet  Generic drug:  sitaGLIPtin  TAKE 1 TABLET (100 MG TOTAL) BY MOUTH DAILY.     losartan 50 MG tablet  Commonly known as:  COZAAR  TAKE 1 TABLET (50 MG TOTAL) BY MOUTH DAILY.     metFORMIN 1000 MG tablet  Commonly known as:  GLUCOPHAGE  Take 1 tablet (1,000 mg total) by mouth 2 (two) times daily with a meal.     omeprazole 20 MG capsule  Commonly known as:  PRILOSEC  TAKE 1 CAPSULE EVERY DAY     rOPINIRole 1 MG tablet  Commonly known as:  REQUIP  Take 1 mg by mouth at bedtime. 1 1/2 at bedtime     traMADol 50 MG tablet  Commonly known as:  ULTRAM  Take 1 tablet (50 mg total) by mouth at bedtime as needed.     TRULICITY [place]  Inject into the skin. ONE INJECTION WEEKLY           Objective:   Physical Exam BP 132/68 mmHg  Pulse 84  Temp(Src) 98.3 F (36.8 C) (Oral)  Wt 217 lb 8 oz (98.657 kg)  SpO2 97% General -- alert, well-developed,  NAD.  Lungs -- normal respiratory effort, no intercostal retractions, no accessory muscle use, and normal breath sounds.  Heart-- normal rate, regular rhythm, no murmur.   Extremities-- no pretibial edema bilaterally  Neurologic--  alert & oriented X3. Speech normal, gait appropriate for age, strength symmetric and appropriate for age.  Psych-- Cognition and judgment appear intact. Cooperative with normal attention span and concentration. No anxious or depressed appearing.        Assessment & Plan:    Fatigue, ongoing issue.  most recent CBC showed no anemia, last vitamin D and B12 normal  Today , I spent more than 25   min with the patient: >50% of the time counseling regards MRI of the shoulder and  coordinating his care

## 2013-12-01 DIAGNOSIS — R936 Abnormal findings on diagnostic imaging of limbs: Secondary | ICD-10-CM | POA: Insufficient documentation

## 2013-12-01 NOTE — Assessment & Plan Note (Addendum)
Abnormal MRI of the shoulder, see report below,   Discussed results with radiology: The bone marrow has some atypical/nonspecific appearance, could be normal or a hematological malignancy. Given the lack of fevers, normal CBC, is likely an atypical but otherwise normal marrow. Plan: Repeat MRI in 2 months for stability, patient agreeable with plan   MRI shoulder 11-2013 report Abnormal marrow signal in the proximal humerus which may represent an atypical appearance of reactivation red marrow however a hematologic malignancy cannot be excluded.

## 2013-12-01 NOTE — Assessment & Plan Note (Signed)
  Severe shoulder DJD, has the option of a shoulder replacement. Reports that she is still able to do her activities of daily living without too many problems. I think that if she is functional and the pain is managed with a small amount of medications she is probably okay otherwise she should consider the replacement understanding that there is no guarantees that the surgery will take care of the pain and decreased range of motion

## 2013-12-03 ENCOUNTER — Encounter: Payer: Medicare Other | Admitting: Internal Medicine

## 2013-12-06 ENCOUNTER — Other Ambulatory Visit: Payer: Self-pay

## 2013-12-08 ENCOUNTER — Ambulatory Visit: Payer: Medicare Other | Admitting: Internal Medicine

## 2013-12-10 ENCOUNTER — Telehealth: Payer: Self-pay | Admitting: *Deleted

## 2013-12-10 NOTE — Telephone Encounter (Signed)
Received fax from [facility] requesting medical records for the year of 2015. Form forwarded to [facility]. JG//CMA

## 2013-12-24 ENCOUNTER — Encounter: Payer: Self-pay | Admitting: Internal Medicine

## 2013-12-24 ENCOUNTER — Ambulatory Visit (INDEPENDENT_AMBULATORY_CARE_PROVIDER_SITE_OTHER): Payer: Medicare Other | Admitting: Internal Medicine

## 2013-12-24 VITALS — BP 128/78 | HR 71 | Temp 97.6°F | Wt 221.2 lb

## 2013-12-24 DIAGNOSIS — G2581 Restless legs syndrome: Secondary | ICD-10-CM

## 2013-12-24 DIAGNOSIS — M7551 Bursitis of right shoulder: Secondary | ICD-10-CM

## 2013-12-24 DIAGNOSIS — E119 Type 2 diabetes mellitus without complications: Secondary | ICD-10-CM

## 2013-12-24 DIAGNOSIS — Z23 Encounter for immunization: Secondary | ICD-10-CM

## 2013-12-24 DIAGNOSIS — Z0000 Encounter for general adult medical examination without abnormal findings: Secondary | ICD-10-CM

## 2013-12-24 DIAGNOSIS — I10 Essential (primary) hypertension: Secondary | ICD-10-CM

## 2013-12-24 DIAGNOSIS — M8580 Other specified disorders of bone density and structure, unspecified site: Secondary | ICD-10-CM

## 2013-12-24 DIAGNOSIS — E785 Hyperlipidemia, unspecified: Secondary | ICD-10-CM

## 2013-12-24 NOTE — Telephone Encounter (Signed)
error 

## 2013-12-24 NOTE — Patient Instructions (Signed)
Stop by the front desk and schedule labs to be done within few days (fasting)  Please come back to the office in 6 months  for a routine check up  No fasting     Fall Prevention and Home Safety Falls cause injuries and can affect all age groups. It is possible to use preventive measures to significantly decrease the likelihood of falls. There are many simple measures which can make your home safer and prevent falls. OUTDOORS  Repair cracks and edges of walkways and driveways.  Remove high doorway thresholds.  Trim shrubbery on the main path into your home.  Have good outside lighting.  Clear walkways of tools, rocks, debris, and clutter.  Check that handrails are not broken and are securely fastened. Both sides of steps should have handrails.  Have leaves, snow, and ice cleared regularly.  Use sand or salt on walkways during winter months.  In the garage, clean up grease or oil spills. BATHROOM  Install night lights.  Install grab bars by the toilet and in the tub and shower.  Use non-skid mats or decals in the tub or shower.  Place a plastic non-slip stool in the shower to sit on, if needed.  Keep floors dry and clean up all water on the floor immediately.  Remove soap buildup in the tub or shower on a regular basis.  Secure bath mats with non-slip, double-sided rug tape.  Remove throw rugs and tripping hazards from the floors. BEDROOMS  Install night lights.  Make sure a bedside light is easy to reach.  Do not use oversized bedding.  Keep a telephone by your bedside.  Have a firm chair with side arms to use for getting dressed.  Remove throw rugs and tripping hazards from the floor. KITCHEN  Keep handles on pots and pans turned toward the center of the stove. Use back burners when possible.  Clean up spills quickly and allow time for drying.  Avoid walking on wet floors.  Avoid hot utensils and knives.  Position shelves so they are not too high  or low.  Place commonly used objects within easy reach.  If necessary, use a sturdy step stool with a grab bar when reaching.  Keep electrical cables out of the way.  Do not use floor polish or wax that makes floors slippery. If you must use wax, use non-skid floor wax.  Remove throw rugs and tripping hazards from the floor. STAIRWAYS  Never leave objects on stairs.  Place handrails on both sides of stairways and use them. Fix any loose handrails. Make sure handrails on both sides of the stairways are as long as the stairs.  Check carpeting to make sure it is firmly attached along stairs. Make repairs to worn or loose carpet promptly.  Avoid placing throw rugs at the top or bottom of stairways, or properly secure the rug with carpet tape to prevent slippage. Get rid of throw rugs, if possible.  Have an electrician put in a light switch at the top and bottom of the stairs. OTHER FALL PREVENTION TIPS  Wear low-heel or rubber-soled shoes that are supportive and fit well. Wear closed toe shoes.  When using a stepladder, make sure it is fully opened and both spreaders are firmly locked. Do not climb a closed stepladder.  Add color or contrast paint or tape to grab bars and handrails in your home. Place contrasting color strips on first and last steps.  Learn and use mobility aids as needed. Install  an electrical emergency response system.  Turn on lights to avoid dark areas. Replace light bulbs that burn out immediately. Get light switches that glow.  Arrange furniture to create clear pathways. Keep furniture in the same place.  Firmly attach carpet with non-skid or double-sided tape.  Eliminate uneven floor surfaces.  Select a carpet pattern that does not visually hide the edge of steps.  Be aware of all pets. OTHER HOME SAFETY TIPS  Set the water temperature for 120 F (48.8 C).  Keep emergency numbers on or near the telephone.  Keep smoke detectors on every level of  the home and near sleeping areas. Document Released: 12/14/2001 Document Revised: 06/25/2011 Document Reviewed: 03/15/2011 [facility] Patient Information 2015 [place], [place]. This information is not intended to replace advice given to you by your health care provider. Make sure you discuss any questions you have with your health care provider.   Preventive Care for Adults Ages 7 years and over  Blood pressure check.** / Every 1 to 2 years.  Lipid and cholesterol check.** / Every 5 years beginning at age 67 years.  Lung cancer screening. / Every year if you are aged 71-80 years and have a 30-pack-year history of smoking and currently smoke or have quit within the past 15 years. Yearly screening is stopped once you have quit smoking for at least 15 years or develop a health problem that would prevent you from having lung cancer treatment.  Clinical breast exam.** / Every year after age 25 years.  BRCA-related cancer risk assessment.** / For women who have family members with a BRCA-related cancer (breast, ovarian, tubal, or peritoneal cancers).  Mammogram.** / Every year beginning at age 34 years and continuing for as long as you are in good health. Consult with your health care provider.  Pap test.** / Every 3 years starting at age 17 years through age 38 or 38 years with 3 consecutive normal Pap tests. Testing can be stopped between 65 and 70 years with 3 consecutive normal Pap tests and no abnormal Pap or HPV tests in the past 10 years.  HPV screening.** / Every 3 years from ages 71 years through ages 53 or 51 years with a history of 3 consecutive normal Pap tests. Testing can be stopped between 65 and 70 years with 3 consecutive normal Pap tests and no abnormal Pap or HPV tests in the past 10 years.  Fecal occult blood test (FOBT) of stool. / Every year beginning at age 5 years and continuing until age 83 years. You may not need to do this test if you get a colonoscopy every 10  years.  Flexible sigmoidoscopy or colonoscopy.** / Every 5 years for a flexible sigmoidoscopy or every 10 years for a colonoscopy beginning at age 33 years and continuing until age 17 years.  Hepatitis C blood test.** / For all people born from [birth year] through 1965 and any individual with known risks for hepatitis C.  Osteoporosis screening.** / A one-time screening for women ages 9 years and over and women at risk for fractures or osteoporosis.  Skin self-exam. / Monthly.  Influenza vaccine. / Every year.  Tetanus, diphtheria, and acellular pertussis (Tdap/Td) vaccine.** / 1 dose of Td every 10 years.  Varicella vaccine.** / Consult your health care provider.  Zoster vaccine.** / 1 dose for adults aged 49 years or older.  Pneumococcal 13-valent conjugate (PCV13) vaccine.** / Consult your health care provider.  Pneumococcal polysaccharide (PPSV23) vaccine.** / 1 dose for all adults aged  55 years and older.  Meningococcal vaccine.** / Consult your health care provider.  Hepatitis A vaccine.** / Consult your health care provider.  Hepatitis B vaccine.** / Consult your health care provider.  Haemophilus influenzae type b (Hib) vaccine.** / Consult your health care provider. ** Family history and personal history of risk and conditions may change your health care provider's recommendations. Document Released: 02/19/2001 Document Revised: 05/10/2013 Document Reviewed: 05/21/2010 [facility] Patient Information 2015 [place], [place]. This information is not intended to replace advice given to you by your health care provider. Make sure you discuss any questions you have with your health care provider.

## 2013-12-24 NOTE — Assessment & Plan Note (Addendum)
Td-- 2014 Pneumovax 2009-2014 prevnar-- 12-2013 shingles shot 2010 Had a Flu shot    Due to see  gynecology 2016 last mammogram 05-2013 , normal  + FH colon cancer ----> Colonoscopy: 04/18/2009--next 2016  Diet-exercise discussed

## 2013-12-24 NOTE — Assessment & Plan Note (Addendum)
Continue losartan and atenolol, check a BMP Addendum, BMP 11/19/2013 at endocrinology: Creatinine 0.7, potassium  4.5.

## 2013-12-24 NOTE — Assessment & Plan Note (Addendum)
On Lipitor, check FLP Addendum, FLP 11/19/2013 at endocrinology: Cholesterol 157, LDL 69

## 2013-12-24 NOTE — Assessment & Plan Note (Signed)
Currently doing great and not needing requip. Recommend to restart Requip if necessary

## 2013-12-24 NOTE — Assessment & Plan Note (Signed)
Bone density  05-2013 showed osteopenia, was prescribed calcium, vitamin D and exercise

## 2013-12-24 NOTE — Assessment & Plan Note (Signed)
on gabapentin, very rarely use hydrocodone, not using tramadol.

## 2013-12-24 NOTE — Assessment & Plan Note (Addendum)
Now under the care of Dr.Kerr currently on insulin, trulicity, metformin. Amaryl was discontinued

## 2013-12-24 NOTE — Progress Notes (Signed)
Subjective:    Patient ID: [name], female    DOB: 1945/05/24, 68 y.o.   MRN: [id number]  DOS:  12/24/2013 Type of visit - description :   HPI Here for Medicare AWV: 1. Risk factors based on Past M, S, F history: reviewed 2. Physical Activities: active, plans to go back to more routine exercise      3. Depression/mood:  Sx well controlled, screening neg   4. Hearing: No problems noted or reported  5. ADL's:  Independent   6. Fall Risk: no recent falls ,  see instructions 7. home Safety: does feel safe at home   8. Height, weight, &visual acuity: see VS, vision well corrected, sees eye doctor 9. Counseling: provided 10. Labs ordered based on risk factors: if needed   11. Referral Coordination: if needed 12.  Care Plan, see assessment and plan   13.   Cognitive Assessment: Motor skills and cognition seem appropriate for age  45. Care team-- Dr Creig Hines, Dr Buddy Duty, Dr Tamera Punt, Dr Clovis Pu  15. Written plan provided  In addition, today we discussed the following: High cholesterol, good compliance with Lipitor High blood pressure, good compliance with medications, ambulatory BPs 120, 130. RLS, not needing requip at this point. Pain management: Takes hydrocodone rarely, gabapentin seems to be helping the shoulder pain Depression well-controlled, was referred for memory testing by her psychiatrist, results pending  ROS Denies chest pain difficulty breathing. No nausea, vomiting, diarrhea. Denies dysuria, gross hematuria difficulty urinating. No vaginal discharge or bleeding.  Past Medical History  Diagnosis Date  . Diabetes mellitus     dr Buddy Duty  . Depression     sees Dr.Cotle  . Hyperlipemia   . Hypertension   . IBS (irritable bowel syndrome)     and dyspepsia  . RLS (restless legs syndrome)   . Adenomatous colon polyp 03/1988  . Osteopenia     dexa 06/2007 and 10/11 Rx CA vitamin d  . Chest pain      Sept 2011: stress test neg  . Diverticulosis   . Fatty liver    Increased LFTs, saw GI 06/2011, likely from fatty liver     Past Surgical History  Procedure Laterality Date  . Uterine fibroid embolization      90s  . Tonsillectomy      History   Social History  . Marital Status: Married    Spouse Name: [name]    Number of Children: 3  . Years of Education: N/A   Occupational History  . retired, was a Leisure centre manager)     Therapist, sports  .     Social History Main Topics  . Smoking status: Former Smoker -- 2.00 packs/day for 10 years    Types: Cigarettes    Quit date: 11/26/1975  . Smokeless tobacco: Never Used     Comment: used to smoke 2 ppd  . Alcohol Use: Yes     Comment: rarely  . Drug Use: No  . Sexual Activity: Not on file   Other Topics Concern  . Not on file   Social History Narrative   Lives w/  [name]   2 g-child, twins on the way     Family History  Problem Relation Age of Onset  . Lung cancer Mother     smoker  . Ovarian cancer Paternal Aunt     ?  Marland Kitchen Breast cancer Neg Hx   . Diabetes Other     aunts-uncles   .  Colon cancer Father     F dx in his 50s  . Heart attack Other     GM in her 22s  . Stroke Other     aunts-uncles       Medication List       This list is accurate as of: 12/24/13 11:59 PM.  Always use your most recent med list.               aspirin 81 MG tablet  Take 81 mg by mouth daily.     atenolol 50 MG tablet  Commonly known as:  TENORMIN  TAKE 1 TABLET DAILY     atorvastatin 40 MG tablet  Commonly known as:  LIPITOR  Take 40 mg by mouth daily.     B-D ULTRAFINE III SHORT PEN 31G X 8 MM Misc  Generic drug:  Insulin Pen Needle  USE AS DIRECTED     clonazePAM 0.5 MG tablet  Commonly known as:  KLONOPIN  Take by mouth. As needed     Diclofenac Sodium 2 % Soln  Apply twice daily.     DULoxetine 60 MG capsule  Commonly known as:  CYMBALTA  Take 60 mg by mouth daily.     gabapentin 100 MG capsule  Commonly known as:  NEURONTIN  Take 1 capsule (100 mg total) by mouth at  bedtime.     Glucosamine 500 MG Caps  Take 4 capsules by mouth daily.     glucose blood test strip  Commonly known as:  ONE TOUCH ULTRA TEST  Check twice a day     HYDROcodone-acetaminophen 7.5-325 MG per tablet  Commonly known as:  NORCO  Take 1 tablet by mouth every 8 (eight) hours as needed for moderate pain (cough).     Insulin Detemir 100 UNIT/ML Pen  Commonly known as:  LEVEMIR FLEXTOUCH  INJECT 0.5 ML INTO SKIN DAILY AS DIRECTED     losartan 50 MG tablet  Commonly known as:  COZAAR  TAKE 1 TABLET (50 MG TOTAL) BY MOUTH DAILY.     metFORMIN 1000 MG tablet  Commonly known as:  GLUCOPHAGE  Take 1 tablet (1,000 mg total) by mouth 2 (two) times daily with a meal.     omega-3 fish oil 1000 MG Caps capsule  Commonly known as:  MAXEPA  Take 1 capsule by mouth daily.     omeprazole 20 MG capsule  Commonly known as:  PRILOSEC  TAKE 1 CAPSULE EVERY DAY     rOPINIRole 1 MG tablet  Commonly known as:  REQUIP  Take 1 mg by mouth at bedtime. 1 1/2 at bedtime     traMADol 50 MG tablet  Commonly known as:  ULTRAM  Take 1 tablet (50 mg total) by mouth at bedtime as needed.     TRULICITY [place]  Inject into the skin. ONE INJECTION WEEKLY     TURMERIC PO  Take 1 tablet by mouth daily.     vitamin B-12 1000 MCG tablet  Commonly known as:  CYANOCOBALAMIN  Take 1,000 mcg by mouth daily.     Vitamin D-3 1000 UNITS Caps  Take 1 capsule by mouth daily.           Objective:   Physical Exam BP 128/78 mmHg  Pulse 71  Temp(Src) 97.6 F (36.4 C) (Oral)  Wt 221 lb 4 oz (100.358 kg)  SpO2 96% General -- alert, well-developed, NAD.  Neck --no thyromegaly , normal carotid pulse  HEENT-- Not pale.  Lungs -- normal respiratory effort, no intercostal retractions, no accessory muscle use, and normal breath sounds.  Heart-- normal rate, regular rhythm, no murmur.  Abdomen-- Not distended, good bowel sounds,soft, non-tender.  Extremities-- no pretibial edema bilaterally    Neurologic--  alert & oriented X3. Speech normal, gait appropriate for age, strength symmetric and appropriate for age.  Psych-- Cognition and judgment appear intact. Cooperative with normal attention span and concentration. No anxious or depressed appearing.     Assessment & Plan:

## 2013-12-24 NOTE — Progress Notes (Signed)
Pre visit review using our clinic review tool, if applicable. No additional management support is needed unless otherwise documented below in the visit note. 

## 2013-12-27 ENCOUNTER — Other Ambulatory Visit (INDEPENDENT_AMBULATORY_CARE_PROVIDER_SITE_OTHER): Payer: Medicare Other

## 2013-12-27 DIAGNOSIS — E785 Hyperlipidemia, unspecified: Secondary | ICD-10-CM

## 2013-12-27 DIAGNOSIS — E119 Type 2 diabetes mellitus without complications: Secondary | ICD-10-CM

## 2013-12-27 DIAGNOSIS — I10 Essential (primary) hypertension: Secondary | ICD-10-CM

## 2013-12-27 LAB — CBC WITH DIFFERENTIAL/PLATELET
Basophils Absolute: 0 10*3/uL (ref 0.0–0.1)
Basophils Relative: 0.5 % (ref 0.0–3.0)
Eosinophils Absolute: 0.3 10*3/uL (ref 0.0–0.7)
Eosinophils Relative: 4.4 % (ref 0.0–5.0)
HCT: 36.9 % (ref 36.0–46.0)
Hemoglobin: 12 g/dL (ref 12.0–15.0)
Lymphocytes Relative: 38.1 % (ref 12.0–46.0)
Lymphs Abs: 2.7 10*3/uL (ref 0.7–4.0)
MCHC: 32.4 g/dL (ref 30.0–36.0)
MCV: 81.2 fl (ref 78.0–100.0)
Monocytes Absolute: 0.4 10*3/uL (ref 0.1–1.0)
Monocytes Relative: 5.1 % (ref 3.0–12.0)
Neutro Abs: 3.7 10*3/uL (ref 1.4–7.7)
Neutrophils Relative %: 51.9 % (ref 43.0–77.0)
Platelets: 218 10*3/uL (ref 150.0–400.0)
RBC: 4.54 Mil/uL (ref 3.87–5.11)
RDW: 15.9 % — ABNORMAL HIGH (ref 11.5–15.5)
WBC: 7.2 10*3/uL (ref 4.0–10.5)

## 2013-12-28 NOTE — Addendum Note (Signed)
Addended by: Kathlene November E on: 12/28/2013 10:09 AM   Modules accepted: SmartSet

## 2014-01-06 ENCOUNTER — Other Ambulatory Visit: Payer: Medicare Other

## 2014-01-06 LAB — BASIC METABOLIC PANEL
BUN: 8 mg/dL (ref 6–23)
CO2: 27 mEq/L (ref 19–32)
Calcium: 9.2 mg/dL (ref 8.4–10.5)
Chloride: 103 mEq/L (ref 96–112)
Creatinine, Ser: 0.8 mg/dL (ref 0.4–1.2)
GFR: 75.61 mL/min (ref >60.00)
Glucose, Bld: 124 mg/dL — ABNORMAL HIGH (ref 70–99)
Potassium: 3.8 mEq/L (ref 3.5–5.1)
Sodium: 136 mEq/L (ref 135–145)

## 2014-01-06 LAB — LIPID PANEL
Cholesterol: 149 mg/dL (ref 0–200)
HDL: 38.9 mg/dL — ABNORMAL LOW (ref >39.00)
LDL Cholesterol: 78 mg/dL (ref 0–99)
NonHDL: 110.1
Total CHOL/HDL Ratio: 4
Triglycerides: 159 mg/dL — ABNORMAL HIGH (ref 0.0–149.0)
VLDL: 31.8 mg/dL (ref 0.0–40.0)

## 2014-01-06 NOTE — Addendum Note (Signed)
Addended by: Modena Morrow D on: 01/06/2014 08:50 AM   Modules accepted: Orders

## 2014-01-13 ENCOUNTER — Ambulatory Visit: Payer: Medicare Other | Admitting: Psychology

## 2014-01-21 ENCOUNTER — Other Ambulatory Visit: Payer: Self-pay | Admitting: Physician Assistant

## 2014-01-21 NOTE — Telephone Encounter (Signed)
Rx request to pharmacy/SLS  

## 2014-01-26 ENCOUNTER — Telehealth: Payer: Self-pay | Admitting: Internal Medicine

## 2014-01-26 NOTE — Telephone Encounter (Signed)
Discussed the possibility of stem cell therapy and how this could be very painful. Patient has elected to go ahead with the surgery.

## 2014-01-26 NOTE — Telephone Encounter (Signed)
Pt called to say she saw the surgeon. She was wondering stem cell therapy would be an option as opposed to surgery?  [phone]

## 2014-01-27 ENCOUNTER — Ambulatory Visit (INDEPENDENT_AMBULATORY_CARE_PROVIDER_SITE_OTHER): Payer: 59 | Admitting: Psychology

## 2014-01-27 DIAGNOSIS — F332 Major depressive disorder, recurrent severe without psychotic features: Secondary | ICD-10-CM

## 2014-02-03 ENCOUNTER — Ambulatory Visit: Payer: Self-pay | Admitting: Gastroenterology

## 2014-02-07 ENCOUNTER — Ambulatory Visit: Payer: Self-pay | Admitting: Internal Medicine

## 2014-02-08 ENCOUNTER — Ambulatory Visit (INDEPENDENT_AMBULATORY_CARE_PROVIDER_SITE_OTHER): Payer: 59 | Admitting: Psychology

## 2014-02-08 DIAGNOSIS — F332 Major depressive disorder, recurrent severe without psychotic features: Secondary | ICD-10-CM

## 2014-02-09 ENCOUNTER — Telehealth: Payer: Self-pay | Admitting: *Deleted

## 2014-02-09 ENCOUNTER — Telehealth: Payer: Self-pay | Admitting: Internal Medicine

## 2014-02-09 NOTE — Telephone Encounter (Signed)
Pt has an appointment with Dr. Larose Kells scheduled for Friday, 02/11/14 @ 9:30 am.

## 2014-02-09 NOTE — Telephone Encounter (Signed)
Patient Name: [name]  DOB: [birth year]    Initial Comment Caller states may have blood in her stool. Loose.    Nurse Assessment  Nurse: Mallie Mussel, RN, Alveta Heimlich Date/Time Eilene Ghazi Time): 02/09/2014 9:44:25 AM  Confirm and document reason for call. If symptomatic, describe symptoms. ---Caller states that her stools have been loose with an orange color for about 2-3 weeks. She thinks it may be blood. She had made an appointment, it got better, she cancelled the appointment. It has come back again. She denies pain. Denies red blood and black stools.  Has the patient traveled out of the country within the last 30 days? ---No  Does the patient require triage? ---Yes  Related visit to physician within the last 2 weeks? ---No  Does the PT have any chronic conditions? (i.e. diabetes, asthma, etc.) ---Yes  List chronic conditions. ---Diabetes, HTN, Hypercholesterolemia. She has a hx of colon polyps.     Guidelines    Guideline Title Affirmed Question Affirmed Notes  Stools - Unusual Color [1] Abnormal color is unexplained AND [2] persists > 24 hours    Final Disposition User   See PCP When Office is Open (within 3 days) Mallie Mussel, Therapist, sports, Alveta Heimlich

## 2014-02-09 NOTE — Telephone Encounter (Signed)
Opened in error

## 2014-02-11 ENCOUNTER — Ambulatory Visit (INDEPENDENT_AMBULATORY_CARE_PROVIDER_SITE_OTHER): Payer: Medicare Other | Admitting: Internal Medicine

## 2014-02-11 ENCOUNTER — Encounter: Payer: Self-pay | Admitting: Internal Medicine

## 2014-02-11 VITALS — BP 152/76 | HR 70 | Temp 98.1°F | Wt 211.1 lb

## 2014-02-11 DIAGNOSIS — R195 Other fecal abnormalities: Secondary | ICD-10-CM

## 2014-02-11 LAB — CBC WITH DIFFERENTIAL/PLATELET
Basophils Absolute: 0 10*3/uL (ref 0.0–0.1)
Basophils Relative: 0.5 % (ref 0.0–3.0)
Eosinophils Absolute: 0.3 10*3/uL (ref 0.0–0.7)
Eosinophils Relative: 4 % (ref 0.0–5.0)
HCT: 38.5 % (ref 36.0–46.0)
Hemoglobin: 12.9 g/dL (ref 12.0–15.0)
Lymphocytes Relative: 30.4 % (ref 12.0–46.0)
Lymphs Abs: 2.4 10*3/uL (ref 0.7–4.0)
MCHC: 33.4 g/dL (ref 30.0–36.0)
MCV: 80 fl (ref 78.0–100.0)
Monocytes Absolute: 0.4 10*3/uL (ref 0.1–1.0)
Monocytes Relative: 5.2 % (ref 3.0–12.0)
Neutro Abs: 4.7 10*3/uL (ref 1.4–7.7)
Neutrophils Relative %: 59.9 % (ref 43.0–77.0)
Platelets: 221 10*3/uL (ref 150.0–400.0)
RBC: 4.81 Mil/uL (ref 3.87–5.11)
RDW: 14.3 % (ref 11.5–15.5)
WBC: 7.8 10*3/uL (ref 4.0–10.5)

## 2014-02-11 LAB — HEMOCCULT GUIAC POC 1CARD (OFFICE): Fecal Occult Blood, POC: NEGATIVE

## 2014-02-11 NOTE — Progress Notes (Signed)
Subjective:    Patient ID: [name], female    DOB: 07/11/1945, 69 y.o.   MRN: [id number]  DOS:  02/11/2014 Type of visit - description : Acute Interval history: 2 or 3 weeks ago noted that her stools were decrease in amount, very soft and noted a orange tint in the water around them when she looked at the commode. Not taking any new OTCs or herbal medications. Has been taking turmeric for a while She took one dose of Trulicity  4 weeks ago and discontinued it due to nausea which is now resolved.  Review of Systems  No fever chills or unexplained weight loss No actual blood in the stools, no abdominal pain. Other than the acute complaints she is feeling well.   Past Medical History  Diagnosis Date  . Diabetes mellitus     dr Buddy Duty  . Depression     sees Dr.Cotle  . Hyperlipemia   . Hypertension   . IBS (irritable bowel syndrome)     and dyspepsia  . RLS (restless legs syndrome)   . Adenomatous colon polyp 03/1988  . Osteopenia     dexa 06/2007 and 10/11 Rx CA vitamin d  . Chest pain      Sept 2011: stress test neg  . Diverticulosis   . Fatty liver     Increased LFTs, saw GI 06/2011, likely from fatty liver   . Adenomatous colon polyp 1990    Past Surgical History  Procedure Laterality Date  . Uterine fibroid embolization      90s  . Tonsillectomy      History   Social History  . Marital Status: Married    Spouse Name: [name]    Number of Children: 3  . Years of Education: N/A   Occupational History  . retired, was a Leisure centre manager)     Therapist, sports  .     Social History Main Topics  . Smoking status: Former Smoker -- 2.00 packs/day for 10 years    Types: Cigarettes    Quit date: 11/26/1975  . Smokeless tobacco: Never Used     Comment: used to smoke 2 ppd  . Alcohol Use: Yes     Comment: rarely  . Drug Use: No  . Sexual Activity: Not on file   Other Topics Concern  . Not on file   Social History Narrative   Lives w/  [name]   2 g-child,  twins on the way        Medication List       This list is accurate as of: 02/11/14 11:59 PM.  Always use your most recent med list.               aspirin 81 MG tablet  Take 81 mg by mouth daily.     atenolol 50 MG tablet  Commonly known as:  TENORMIN  TAKE 1 TABLET DAILY     atorvastatin 40 MG tablet  Commonly known as:  LIPITOR  Take 40 mg by mouth daily.     B-D ULTRAFINE III SHORT PEN 31G X 8 MM Misc  Generic drug:  Insulin Pen Needle  USE AS DIRECTED     clonazePAM 0.5 MG tablet  Commonly known as:  KLONOPIN  Take by mouth. As needed     Diclofenac Sodium 2 % Soln  Apply twice daily.     DULoxetine 60 MG capsule  Commonly known as:  CYMBALTA  Take 60 mg by mouth  daily.     gabapentin 100 MG capsule  Commonly known as:  NEURONTIN  Take 1 capsule (100 mg total) by mouth at bedtime.     Glucosamine 500 MG Caps  Take 4 capsules by mouth daily.     glucose blood test strip  Commonly known as:  ONE TOUCH ULTRA TEST  Check twice a day     HYDROcodone-acetaminophen 7.5-325 MG per tablet  Commonly known as:  NORCO  Take 1 tablet by mouth every 8 (eight) hours as needed for moderate pain (cough).     Insulin Detemir 100 UNIT/ML Pen  Commonly known as:  LEVEMIR FLEXTOUCH  INJECT 0.5 ML INTO SKIN DAILY AS DIRECTED     losartan 50 MG tablet  Commonly known as:  COZAAR  TAKE 1 TABLET (50 MG TOTAL) BY MOUTH DAILY.     metFORMIN 1000 MG tablet  Commonly known as:  GLUCOPHAGE  Take 1 tablet (1,000 mg total) by mouth 2 (two) times daily with a meal.     omega-3 fish oil 1000 MG Caps capsule  Commonly known as:  MAXEPA  Take 1 capsule by mouth daily.     omeprazole 20 MG capsule  Commonly known as:  PRILOSEC  TAKE 1 CAPSULE EVERY DAY     rOPINIRole 1 MG tablet  Commonly known as:  REQUIP  Take 1 mg by mouth at bedtime. 1 1/2 at bedtime     traMADol 50 MG tablet  Commonly known as:  ULTRAM  Take 1 tablet (50 mg total) by mouth at bedtime as needed.       TRULICITY [place]  Inject into the skin. ONE INJECTION WEEKLY     TURMERIC PO  Take 1 tablet by mouth daily.     vitamin B-12 1000 MCG tablet  Commonly known as:  CYANOCOBALAMIN  Take 1,000 mcg by mouth daily.     Vitamin D-3 1000 UNITS Caps  Take 1 capsule by mouth daily.           Objective:   Physical Exam  Constitutional: She is oriented to person, place, and time. She appears well-developed. No distress.  HENT:  Head: Normocephalic and atraumatic.  Cardiovascular:  RRR, no murmur , rub or gallop  Pulmonary/Chest: Effort normal. No respiratory distress.  CTA B  Abdominal: Soft. Bowel sounds are normal. She exhibits no distension and no mass. There is no tenderness. There is no rebound and no guarding.  No organomegaly  Genitourinary:  DRE: Rectum normal, stools brown and Hemoccult negative  Musculoskeletal: She exhibits no edema or tenderness.  Neurological: She is alert and oriented to person, place, and time. No cranial nerve deficit. She exhibits normal muscle tone. Coordination normal.  Speech normal, gait unassisted and normal for age, motor strength appropriate for age   Skin: Skin is warm and dry. No pallor.  No jaundice  Psychiatric: She has a normal mood and affect. Her behavior is normal. Judgment and thought content normal.  Vitals reviewed.        Assessment & Plan:   Problem List Items Addressed This Visit      Other   Stool color abnormal - Primary    Stools softer than usual and has seen a orange tint color  in the water around them. DRE normal, Hemoccult negative. She is otherwise asymptomatic.  last colonoscopy in 2011 Last CBC show a hemoglobin of 12. Doubt she has a major problem going on, will check a CBC, otherwise recommend observation  Relevant Orders   POCT Occult Blood Stool (Completed)   CBC with Differential/Platelet (Completed)

## 2014-02-11 NOTE — Assessment & Plan Note (Signed)
Stools softer than usual and has seen a orange tint color  in the water around them. DRE normal, Hemoccult negative. She is otherwise asymptomatic.  last colonoscopy in 2011 Last CBC show a hemoglobin of 12. Doubt she has a major problem going on, will check a CBC, otherwise recommend observation

## 2014-02-11 NOTE — Patient Instructions (Addendum)
Get your blood work before you leave    Call if things are not back to normal in a month

## 2014-02-11 NOTE — Progress Notes (Signed)
Pre visit review using our clinic review tool, if applicable. No additional management support is needed unless otherwise documented below in the visit note. 

## 2014-02-14 ENCOUNTER — Encounter: Payer: Self-pay | Admitting: Internal Medicine

## 2014-02-14 ENCOUNTER — Telehealth: Payer: Self-pay | Admitting: Internal Medicine

## 2014-02-14 NOTE — Telephone Encounter (Signed)
Caller name: Delma Relation to pt: self Call back number: [phone] Pharmacy:  Reason for call:   Requesting last lab results.

## 2014-02-14 NOTE — Telephone Encounter (Signed)
Spoke with Pt informed her CBCs normal. Pt verbalized understanding.

## 2014-02-14 NOTE — Telephone Encounter (Signed)
Advise patient, CBC normal. Good results

## 2014-02-14 NOTE — Telephone Encounter (Signed)
Please advise 

## 2014-02-21 ENCOUNTER — Telehealth: Payer: Self-pay

## 2014-02-21 NOTE — Telephone Encounter (Signed)
UDS: 02/11/2014  Negative for Norco: PRN Negative for Ultram: PRN   Low risk per Dr. Larose Kells 02/21/2014

## 2014-02-22 ENCOUNTER — Ambulatory Visit: Payer: 59 | Admitting: Psychology

## 2014-02-25 LAB — HEMOGLOBIN A1C: Hgb A1c MFr Bld: 8 % — AB (ref 4.0–6.0)

## 2014-02-28 ENCOUNTER — Telehealth: Payer: Self-pay | Admitting: Family Medicine

## 2014-02-28 NOTE — Telephone Encounter (Signed)
Patient wanted to get second opinion for surgery on her shoulder.  She would like to know who Dr. Tamala Julian would recommend.

## 2014-02-28 NOTE — Telephone Encounter (Signed)
Discussed with pt

## 2014-02-28 NOTE — Telephone Encounter (Signed)
Usually J[facility] Reas  And then  [employer].

## 2014-03-01 ENCOUNTER — Telehealth: Payer: Self-pay | Admitting: Family Medicine

## 2014-03-01 NOTE — Telephone Encounter (Signed)
Patient forgot which doctor to call for her shoulder surgery, please advise

## 2014-03-01 NOTE — Telephone Encounter (Signed)
Discussed with pt

## 2014-03-02 ENCOUNTER — Encounter: Payer: Self-pay | Admitting: Internal Medicine

## 2014-03-03 ENCOUNTER — Ambulatory Visit (INDEPENDENT_AMBULATORY_CARE_PROVIDER_SITE_OTHER): Payer: 59 | Admitting: Psychology

## 2014-03-03 DIAGNOSIS — F332 Major depressive disorder, recurrent severe without psychotic features: Secondary | ICD-10-CM

## 2014-03-04 ENCOUNTER — Telehealth: Payer: Self-pay | Admitting: Internal Medicine

## 2014-03-04 DIAGNOSIS — R197 Diarrhea, unspecified: Secondary | ICD-10-CM

## 2014-03-04 NOTE — Addendum Note (Signed)
Addended by: Wilfrid Lund on: 03/04/2014 04:18 PM   Modules accepted: Orders

## 2014-03-04 NOTE — Telephone Encounter (Signed)
Spoke with Pt, she informed me she is not having fever, chills, N/V. Informed her that she may come by office on Monday or at her earliest convenience to pick up stool containers to check for C. Diff, culture, WBCs. Informed Pt that Saturday Clinic will be at [facility] tomorrow, if she needs to be seen sooner. Gave Pt Elam's number in case she would like to schedule an appointment. Pt verbalized understanding.

## 2014-03-04 NOTE — Telephone Encounter (Signed)
Please advise. Pt was seen 02/11/2014 with stool color differences.

## 2014-03-04 NOTE — Telephone Encounter (Signed)
Caller name:Kana Natoshia Relation to NA:TFTD Call back number:[phone] Pharmacy:  Reason for call: pt would like for you to call her states she has had trouble with stomach for 2weeks, states she is having loose stool and has only had about 3 regular bowel movement in 6 weeks, just wanted to know if it is something to be concerned about.

## 2014-03-04 NOTE — Telephone Encounter (Signed)
If she is feeling poorly (fever, chills, nausea, stomach pain) needs to be seen. If she is feeling okay, please arrange for the following stool tests: C. difficile, culture, WBCs, DX   diarrhea

## 2014-03-10 ENCOUNTER — Encounter: Payer: Self-pay | Admitting: Gastroenterology

## 2014-03-10 ENCOUNTER — Other Ambulatory Visit: Payer: Self-pay | Admitting: Internal Medicine

## 2014-03-10 ENCOUNTER — Other Ambulatory Visit: Payer: Medicare Other

## 2014-03-10 DIAGNOSIS — R197 Diarrhea, unspecified: Secondary | ICD-10-CM

## 2014-03-11 ENCOUNTER — Telehealth: Payer: Self-pay

## 2014-03-11 LAB — C. DIFFICILE GDH AND TOXIN A/B
C. difficile GDH: NOT DETECTED
C. difficile Toxin A/B: NOT DETECTED

## 2014-03-11 LAB — FECAL LACTOFERRIN, QUANT: Lactoferrin: NEGATIVE

## 2014-03-11 NOTE — Telephone Encounter (Signed)
C/o:  OVERDOSE took too much medication at once  Initial comment:  "Caller states think s took too much of diabetic meds, has question, is it ok?  1000 mg each."  Reason for call:  "Caller states thinks took to much of diabetic meds, has question, is it ok? Caller stated that she took glucophage has taken possibly 2000 mg at the present time and she has not checked her blood sugar at present."  Disposition:  Call [facility] Now  Nurse:  Charmayne Sheer, RN

## 2014-03-13 ENCOUNTER — Other Ambulatory Visit: Payer: Self-pay | Admitting: Internal Medicine

## 2014-03-14 LAB — STOOL CULTURE

## 2014-03-16 ENCOUNTER — Telehealth: Payer: Self-pay | Admitting: Internal Medicine

## 2014-03-16 DIAGNOSIS — R195 Other fecal abnormalities: Secondary | ICD-10-CM

## 2014-03-16 NOTE — Telephone Encounter (Signed)
Caller name: Daisie Relation to pt: self Call back number: [phone] Pharmacy:  Reason for call:   Patient states that she does want to see Dr. Fuller Plan regarding her stool samples?

## 2014-03-16 NOTE — Telephone Encounter (Signed)
Referral placed to Dr. Justin Mend for changes in stool.

## 2014-03-16 NOTE — Telephone Encounter (Signed)
Patient states that she is still having runny stools. Changed color and consistency.

## 2014-03-17 ENCOUNTER — Ambulatory Visit (INDEPENDENT_AMBULATORY_CARE_PROVIDER_SITE_OTHER): Payer: 59 | Admitting: Psychology

## 2014-03-17 DIAGNOSIS — F4323 Adjustment disorder with mixed anxiety and depressed mood: Secondary | ICD-10-CM | POA: Diagnosis not present

## 2014-03-18 ENCOUNTER — Telehealth: Payer: Self-pay | Admitting: Family Medicine

## 2014-03-18 NOTE — Telephone Encounter (Signed)
Have you gotten MRI results back and would like to know the results.

## 2014-03-18 NOTE — Telephone Encounter (Signed)
Discussed results with pt. She has rotator cuff tear. Pt is coming in next week to discuss her options.

## 2014-03-22 ENCOUNTER — Encounter: Payer: Self-pay | Admitting: Family Medicine

## 2014-03-22 ENCOUNTER — Ambulatory Visit (INDEPENDENT_AMBULATORY_CARE_PROVIDER_SITE_OTHER): Payer: Medicare Other | Admitting: Family Medicine

## 2014-03-22 ENCOUNTER — Other Ambulatory Visit: Payer: Self-pay | Admitting: Internal Medicine

## 2014-03-22 VITALS — BP 126/80 | HR 78 | Ht 66.0 in | Wt 211.0 lb

## 2014-03-22 DIAGNOSIS — M19011 Primary osteoarthritis, right shoulder: Secondary | ICD-10-CM

## 2014-03-22 MED ORDER — HYDROCODONE-ACETAMINOPHEN 7.5-325 MG PO TABS: 1.0000 | ORAL_TABLET | Freq: Three times a day (TID) | ORAL | Status: DC | PRN

## 2014-03-22 NOTE — Patient Instructions (Signed)
Verbal instructions given

## 2014-03-22 NOTE — Assessment & Plan Note (Signed)
To do patient's significant tear as well as significant amount of osteophytic changes patient was given a suggestion that possibly shoulder replacement surgery would be more beneficial when looking at the evidence. We discussed that it would not be wrong to try to repair the rotator cuff but there is a good chance the patient will have a recurrent tear some time within the next 2-5 years. Patient will discuss this with her husband and likely proceed with shoulder replacement surgery. Patient knows that I'm here for any other questions or concerns.  Spent  25 minutes with patient face-to-face and had greater than 50% of counseling including as described above in assessment and plan.

## 2014-03-22 NOTE — Progress Notes (Signed)
[name] [address] [place], [place] [address] Phone: [phone] Subjective:     CC: Right shoulder follow up  DUK:[id number] [name] is a 69 y.o. female coming in with complaint of right shoulderpain  Regarding patient's right shoulder pain . Patient was seen previously and was diagnosed with subacromial bursitis as well as underlying osteophytic changes degenerative tear of rotator cuff on ultrasound but no true full thickness tear.   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 Patient was sent to formal physical therapy and did not make any significant improvement. Patient unfortunately continued to have pain even after that actually had some new weakness. We attempted another cortical steroid injection with no significant resolution of pain. Further imaging was ordered. MRI patient's shoulder and neck were done and patient is here to go over the findings and further evaluation and treatment options. Patient at this time is taking hydrocodone 1-2 times daily, topical anti-inflammatories, and is no longer taking tramadol. States that it is sore and does stop her from some activity. States that it can come and go though. MRI of right shoulder shows.  1. Severe glenohumeral osteoarthritis. 2. Near complete SUPRASPINATUS tendon tear at the insertion. A fewcentral and posterior bursal surface fibers remain intact. Maximal retraction is between 3 cm and 5 cm. 3. Large glenohumeral effusion with debris/synovitis in the superior SUBSCAPULARIS recess. 4. Mild SUPRASPINATUS muscular atrophy. 5. Abnormal marrow signal in the proximal humerus which may represent an atypical appearance of reactivation red marrow however a hematologic malignancy cannot be excluded. 6. Thickening and edema of the axillary pouch which could beassociated with adhesive capsulitis in the appropriate clinicalsetting.  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  Patient is here to discuss these findings again. Patient was seen previously and was referred to an orthopedic surgeon. 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 Patient has seemed to different orthopedic surgeons at this time. 1, Dr. Tamera Punt recommended shoulder replacement and the other one recommended rotator cuff repair.    Past medical history, social, surgical and family history all reviewed in electronic medical record.   Review of Systems: No headache, visual changes, nausea, vomiting, diarrhea, constipation, dizziness, abdominal pain, skin rash, fevers, chills, night sweats, weight loss, swollen lymph nodes, body aches, joint swelling, muscle aches, chest pain, shortness of breath, mood changes.   Objective Blood pressure 126/80, pulse 78, height 5\' 6"  (1.676 m), weight 211 lb (95.709 kg), SpO2 97 %.  General: No apparent distress alert and oriented x3 mood and affect normal, dressed appropriately.  HEENT: Pupils equal, extraocular movements intact  Respiratory: Patient's speak in full sentences and does not appear short of breath  Cardiovascular: No lower extremity edema, non tender, no erythema  Skin: Warm dry intact with no signs of infection or rash on extremities or onTry the pennsaid  axial skeleton.  Abdomen: Soft nontender  Neuro: Cranial nerves II through XII are intact, neurovascularly intact in all extremities with 2+ DTRs and 2+ pulses.  Lymph: No lymphadenopathy of posterior or anterior cervical chain or axillae bilaterally.  Gait normal with good balance and coordination.  MSK:  Non tender with full range of motion and good stability and symmetric strength and tone of  elbows, wrist, hip, knee and ankles bilaterally.   Neck: Inspection unremarkable. No palpable stepoffs.  Negative Spurling's today Full neck range of  motion Grip strength and sensation normal in bilateral hands Strength good C4 to T1 distribution No sensory change to C4 to T1 Negative Hoffman sign bilaterally Reflexes normal    Shoulder: Right Inspection reveals no abnormalities, atrophy or asymmetry. No signs of infection. Palpation is normal with no tenderness over AC joint or bicipital groove. ROM is just moderately restricted in forward flexion for lacking the last 8 of forward flexion. Patient also has internal rotation only to sacrum. Patient also has a lacking the last 5 of external range of motion   Rotator cuff strength is now 3/5  Speeds and Yergason's tests normal. positive O'Brien's. Normal scapular function observed. Positive painful arc No apprehension sign Mild crepitus on range of motion testing, Contralateral shoulder unremarkable    Impression and Recommendations:     This case required medical decision making of moderate complexity.

## 2014-03-22 NOTE — Progress Notes (Signed)
Pre visit review using our clinic review tool, if applicable. No additional management support is needed unless otherwise documented below in the visit note. 

## 2014-03-31 ENCOUNTER — Ambulatory Visit: Payer: 59 | Admitting: Psychology

## 2014-04-03 ENCOUNTER — Encounter: Payer: Self-pay | Admitting: Internal Medicine

## 2014-04-07 ENCOUNTER — Ambulatory Visit (INDEPENDENT_AMBULATORY_CARE_PROVIDER_SITE_OTHER): Payer: 59 | Admitting: Psychology

## 2014-04-07 DIAGNOSIS — F4323 Adjustment disorder with mixed anxiety and depressed mood: Secondary | ICD-10-CM

## 2014-04-08 ENCOUNTER — Other Ambulatory Visit: Payer: Self-pay | Admitting: Orthopedic Surgery

## 2014-04-11 ENCOUNTER — Telehealth: Payer: Self-pay | Admitting: *Deleted

## 2014-04-11 ENCOUNTER — Encounter (HOSPITAL_COMMUNITY): Payer: Self-pay

## 2014-04-11 ENCOUNTER — Encounter (HOSPITAL_COMMUNITY)
Admission: RE | Admit: 2014-04-11 | Discharge: 2014-04-11 | Disposition: A | Discharge status: Home / Self Care | Payer: Medicare Other | Source: Ambulatory Visit | Attending: Orthopedic Surgery | Admitting: Orthopedic Surgery

## 2014-04-11 ENCOUNTER — Other Ambulatory Visit: Payer: Self-pay | Admitting: Orthopedic Surgery

## 2014-04-11 ENCOUNTER — Ambulatory Visit (HOSPITAL_COMMUNITY)
Admission: RE | Admit: 2014-04-11 | Discharge: 2014-04-11 | Disposition: A | Discharge status: Home / Self Care | Payer: Medicare Other | Source: Ambulatory Visit | Attending: Orthopedic Surgery | Admitting: Orthopedic Surgery

## 2014-04-11 ENCOUNTER — Encounter: Payer: Self-pay | Admitting: *Deleted

## 2014-04-11 ENCOUNTER — Ambulatory Visit (AMBULATORY_SURGERY_CENTER): Payer: Self-pay | Admitting: *Deleted

## 2014-04-11 VITALS — Ht 66.5 in | Wt 213.6 lb

## 2014-04-11 DIAGNOSIS — K219 Gastro-esophageal reflux disease without esophagitis: Secondary | ICD-10-CM | POA: Diagnosis not present

## 2014-04-11 DIAGNOSIS — E119 Type 2 diabetes mellitus without complications: Secondary | ICD-10-CM | POA: Insufficient documentation

## 2014-04-11 DIAGNOSIS — Z794 Long term (current) use of insulin: Secondary | ICD-10-CM | POA: Insufficient documentation

## 2014-04-11 DIAGNOSIS — M25511 Pain in right shoulder: Secondary | ICD-10-CM

## 2014-04-11 DIAGNOSIS — Z01818 Encounter for other preprocedural examination: Secondary | ICD-10-CM | POA: Insufficient documentation

## 2014-04-11 DIAGNOSIS — Z860100 Personal history of colon polyps, unspecified: Secondary | ICD-10-CM

## 2014-04-11 DIAGNOSIS — Z800 Family history of malignant neoplasm of digestive organs: Secondary | ICD-10-CM

## 2014-04-11 DIAGNOSIS — I10 Essential (primary) hypertension: Secondary | ICD-10-CM | POA: Insufficient documentation

## 2014-04-11 DIAGNOSIS — Z87891 Personal history of nicotine dependence: Secondary | ICD-10-CM | POA: Insufficient documentation

## 2014-04-11 HISTORY — DX: Personal history of other diseases of the digestive system: Z87.19

## 2014-04-11 HISTORY — DX: Unspecified osteoarthritis, unspecified site: M19.90

## 2014-04-11 LAB — URINALYSIS, ROUTINE W REFLEX MICROSCOPIC
Bilirubin Urine: NEGATIVE
Glucose, UA: 250 mg/dL — AB
Hgb urine dipstick: NEGATIVE
Ketones, ur: NEGATIVE mg/dL
Leukocytes, UA: NEGATIVE
Nitrite: NEGATIVE
Protein, ur: NEGATIVE mg/dL
Specific Gravity, Urine: 1.006 (ref 1.005–1.030)
Urobilinogen, UA: 0.2 mg/dL (ref 0.0–1.0)
pH: 5 (ref 5.0–8.0)

## 2014-04-11 LAB — PROTIME-INR
INR: 0.97 (ref 0.00–1.49)
Prothrombin Time: 13 seconds (ref 11.6–15.2)

## 2014-04-11 LAB — SURGICAL PCR SCREEN
MRSA, PCR: NEGATIVE
Staphylococcus aureus: NEGATIVE

## 2014-04-11 LAB — COMPREHENSIVE METABOLIC PANEL
ALT: 51 U/L — ABNORMAL HIGH (ref 0–35)
AST: 44 U/L — ABNORMAL HIGH (ref 0–37)
Albumin: 4.2 g/dL (ref 3.5–5.2)
Alkaline Phosphatase: 67 U/L (ref 39–117)
Anion gap: 13 (ref 5–15)
BUN: 9 mg/dL (ref 6–23)
CO2: 23 mmol/L (ref 19–32)
Calcium: 9.8 mg/dL (ref 8.4–10.5)
Chloride: 99 mmol/L (ref 96–112)
Creatinine, Ser: 0.88 mg/dL (ref 0.50–1.10)
GFR calc Af Amer: 76 mL/min — ABNORMAL LOW (ref >90)
GFR calc non Af Amer: 66 mL/min — ABNORMAL LOW (ref >90)
Glucose, Bld: 199 mg/dL — ABNORMAL HIGH (ref 70–99)
Potassium: 3.8 mmol/L (ref 3.5–5.1)
Sodium: 135 mmol/L (ref 135–145)
Total Bilirubin: 0.8 mg/dL (ref 0.3–1.2)
Total Protein: 7.4 g/dL (ref 6.0–8.3)

## 2014-04-11 LAB — CBC WITH DIFFERENTIAL/PLATELET
Basophils Absolute: 0 10*3/uL (ref 0.0–0.1)
Basophils Relative: 0 % (ref 0–1)
Eosinophils Absolute: 0.3 10*3/uL (ref 0.0–0.7)
Eosinophils Relative: 3 % (ref 0–5)
HCT: 39.3 % (ref 36.0–46.0)
Hemoglobin: 13 g/dL (ref 12.0–15.0)
Lymphocytes Relative: 33 % (ref 12–46)
Lymphs Abs: 3 10*3/uL (ref 0.7–4.0)
MCH: 26.2 pg (ref 26.0–34.0)
MCHC: 33.1 g/dL (ref 30.0–36.0)
MCV: 79.1 fL (ref 78.0–100.0)
Monocytes Absolute: 0.4 10*3/uL (ref 0.1–1.0)
Monocytes Relative: 5 % (ref 3–12)
Neutro Abs: 5.3 10*3/uL (ref 1.7–7.7)
Neutrophils Relative %: 59 % (ref 43–77)
Platelets: 235 10*3/uL (ref 150–400)
RBC: 4.97 MIL/uL (ref 3.87–5.11)
RDW: 14 % (ref 11.5–15.5)
WBC: 9 10*3/uL (ref 4.0–10.5)

## 2014-04-11 LAB — TYPE AND SCREEN
ABO/RH(D): A POS
Antibody Screen: NEGATIVE

## 2014-04-11 LAB — APTT: aPTT: 33 seconds (ref 24–37)

## 2014-04-11 MED ORDER — NA SULFATE-K SULFATE-MG SULF 17.5-3.13-1.6 GM/177ML PO SOLN: 1.0000 | Freq: Once | ORAL | Status: DC

## 2014-04-11 NOTE — Telephone Encounter (Signed)
Dr Tamera Punt, Mrs Girgenti is scheduled for a colonoscopy for 05-04-14 Wednesday with Dr Lucio Edward.  She states she is having a shoulder replacement 4-14 and we wanted to be sure her colon this soon post op would be appropriate.  If you think this is too soon we will be happy to reschedule her.  Please advise and thank you for your time.  Lenard Galloway RN, Previsit [facility]

## 2014-04-11 NOTE — Telephone Encounter (Signed)
cahnged colon to 04-15-14 before the shoulder replacement.  Lelan Pons PV

## 2014-04-11 NOTE — Progress Notes (Signed)
Call to Dr. Tamera Punt, spoke with Joellen Jersey, requested that he sign his orders.

## 2014-04-11 NOTE — Progress Notes (Signed)
No egg or soy allergy No diet pill no home 02 use No issues with  past sedation emmi declined Pt has shoulder replacement surgery for 4-14 and her colon was for 4-27 so we rescheduled colon for before the shoulder to 4-8 Friday as pt's request.  pts tates she has had some change in BM's as well. Having lighter looser stools no blood noted in stools , hx of IBS

## 2014-04-11 NOTE — Progress Notes (Signed)
Pt. Reports that she rec's care /w Dr. Larose Kells . She reports at one time she was referred to cardiac for a stress test, > 10 yrs. Ago. She reports it was wnl & there was no need for followup.

## 2014-04-11 NOTE — Pre-Procedure Instructions (Signed)
[name]  04/11/2014   Your procedure is scheduled on:  04/21/2014  Report to [facility] Admitting at 5:30 AM.  Call this number if you have problems the morning of surgery: [phone]   Remember:   Do not eat food or drink liquids after midnight.  On Wednesday   Take these medicines the morning of surgery with A SIP OF WATER: ABILIFY, ATENOLOL, Duloxetine ,  ( you can take Tylenol if you need it)    Do not wear jewelry, make-up or nail polish.   Do not wear lotions, powders, or perfumes. You may wear deodorant.   Do not shave 48 hours prior to surgery.   Do not bring valuables to the hospital.  [facility] is not responsible    for any belongings or valuables.               Contacts, dentures or bridgework may not be worn into surgery.   Leave suitcase in the car. After surgery it may be brought to your room.   For patients admitted to the hospital, discharge time is determined by your                treatment team.               Patients discharged the day of surgery will not be allowed to drive  home.  Name and phone number of your driver: with family  Special Instructions: Special Instructions: [facility] - Preparing for Surgery  Before surgery, you can play an important role.  Because skin is not sterile, your skin needs to be as free of germs as possible.  You can reduce the number of germs on you skin by washing with CHG (chlorahexidine gluconate) soap before surgery.  CHG is an antiseptic cleaner which kills germs and bonds with the skin to continue killing germs even after washing.  Please DO NOT use if you have an allergy to CHG or antibacterial soaps.  If your skin becomes reddened/irritated stop using the CHG and inform your nurse when you arrive at Short Stay.  Do not shave (including legs and underarms) for at least 48 hours prior to the first CHG shower.  You may shave your face.  Please follow these instructions carefully:   1.  Shower with  CHG Soap the night before surgery and the  morning of Surgery.  2.  If you choose to wash your hair, wash your hair first as usual with your  normal shampoo.  3.  After you shampoo, rinse your hair and body thoroughly to remove the  Shampoo.  4.  Use CHG as you would any other liquid soap.  You can apply chg directly to the skin and wash gently with scrungie or a clean washcloth.  5.  Apply the CHG Soap to your body ONLY FROM THE NECK DOWN.    Do not use on open wounds or open sores.  Avoid contact with your eyes, ears, mouth and genitals (private parts).  Wash genitals (private parts)   with your normal soap.  6.  Wash thoroughly, paying special attention to the area where your surgery will be performed.  7.  Thoroughly rinse your body with warm water from the neck down.  8.  DO NOT shower/wash with your normal soap after using and rinsing off   the CHG Soap.  9.  Pat yourself dry with a clean towel.  10.  Wear clean pajamas.            11.  Place clean sheets on your bed the night of your first shower and do not sleep with pets.  Day of Surgery  Do not apply any lotions/deodorants the morning of surgery.  Please wear clean clothes to the hospital/surgery center.   Please read over the following fact sheets that you were given: Pain Booklet, Coughing and Deep Breathing, MRSA Information and Surgical Site Infection Prevention

## 2014-04-12 ENCOUNTER — Ambulatory Visit
Admission: RE | Admit: 2014-04-12 | Discharge: 2014-04-12 | Disposition: A | Discharge status: Home / Self Care | Payer: Medicare Other | Source: Ambulatory Visit | Attending: Orthopedic Surgery | Admitting: Orthopedic Surgery

## 2014-04-12 DIAGNOSIS — M25511 Pain in right shoulder: Secondary | ICD-10-CM

## 2014-04-12 LAB — ABO/RH: ABO/RH(D): A POS

## 2014-04-15 ENCOUNTER — Ambulatory Visit (AMBULATORY_SURGERY_CENTER): Payer: Medicare Other | Admitting: Gastroenterology

## 2014-04-15 ENCOUNTER — Encounter: Payer: Self-pay | Admitting: Gastroenterology

## 2014-04-15 VITALS — BP 118/86 | HR 61 | Temp 98.7°F | Resp 17 | Ht 66.5 in | Wt 213.0 lb

## 2014-04-15 DIAGNOSIS — Z86010 Personal history of colonic polyps: Secondary | ICD-10-CM

## 2014-04-15 DIAGNOSIS — D124 Benign neoplasm of descending colon: Secondary | ICD-10-CM

## 2014-04-15 HISTORY — PX: COLONOSCOPY: SHX174

## 2014-04-15 LAB — GLUCOSE, CAPILLARY
Glucose-Capillary: 176 mg/dL — ABNORMAL HIGH (ref 70–99)
Glucose-Capillary: 156 mg/dL — ABNORMAL HIGH (ref 70–99)

## 2014-04-15 MED ORDER — SODIUM CHLORIDE 0.9 % IV SOLN: 500.0000 mL | INTRAVENOUS | Status: DC

## 2014-04-15 NOTE — Progress Notes (Signed)
Pt has a blue bracelet on her right wrist.  Per the pt this needs to stay on her wrist.  She is having surgery next week. maw

## 2014-04-15 NOTE — Patient Instructions (Signed)
YOU HAD AN ENDOSCOPIC PROCEDURE TODAY AT [facility]:   Refer to the procedure report that was given to you for any specific questions about what was found during the examination.  If the procedure report does not answer your questions, please call your gastroenterologist to clarify.  If you requested that your care partner not be given the details of your procedure findings, then the procedure report has been included in a sealed envelope for you to review at your convenience later.  YOU SHOULD EXPECT: Some feelings of bloating in the abdomen. Passage of more gas than usual.  Walking can help get rid of the air that was put into your GI tract during the procedure and reduce the bloating. If you had a lower endoscopy (such as a colonoscopy or flexible sigmoidoscopy) you may notice spotting of blood in your stool or on the toilet paper. If you underwent a bowel prep for your procedure, you may not have a normal bowel movement for a few days.  Please Note:  You might notice some irritation and congestion in your nose or some drainage.  This is from the oxygen used during your procedure.  There is no need for concern and it should clear up in a day or so.  SYMPTOMS TO REPORT IMMEDIATELY:   Following lower endoscopy (colonoscopy or flexible sigmoidoscopy):  Excessive amounts of blood in the stool  Significant tenderness or worsening of abdominal pains  Swelling of the abdomen that is new, acute  Fever of 100F or higher   For urgent or emergent issues, a gastroenterologist can be reached at any hour by calling [phone].   DIET: Your first meal following the procedure should be a small meal and then it is ok to progress to your normal diet. Heavy or fried foods are harder to digest and may make you feel nauseous or bloated.  Likewise, meals heavy in dairy and vegetables can increase bloating.  Drink plenty of fluids but you should avoid alcoholic beverages for 24  hours.  ACTIVITY:  You should plan to take it easy for the rest of today and you should NOT DRIVE or use heavy machinery until tomorrow (because of the sedation medicines used during the test).    FOLLOW UP: Our staff will call the number listed on your records the next business day following your procedure to check on you and address any questions or concerns that you may have regarding the information given to you following your procedure. If we do not reach you, we will leave a message.  However, if you are feeling well and you are not experiencing any problems, there is no need to return our call.  We will assume that you have returned to your regular daily activities without incident.  If any biopsies were taken you will be contacted by phone or by letter within the next 1-3 weeks.  Please call us at [phone] if you have not heard about the biopsies in 3 weeks.    SIGNATURES/CONFIDENTIALITY: You and/or your care partner have signed paperwork which will be entered into your electronic medical record.  These signatures attest to the fact that that the information above on your After Visit Summary has been reviewed and is understood.  Full responsibility of the confidentiality of this discharge information lies with you and/or your care-partner.  Recommendations Discharge instructions given to patient and/or care partner. Next colonoscopy in 5 years. Polyp and hemorrhoid handouts provided.

## 2014-04-15 NOTE — Progress Notes (Signed)
Called to room to assist during endoscopic procedure.  Patient ID and intended procedure confirmed with present staff. Received instructions for my participation in the procedure from the performing physician.  

## 2014-04-15 NOTE — Op Note (Signed)
[place]  [address] [place], [id number]   COLONOSCOPY PROCEDURE REPORT  PATIENT: [name], [name]  MR#: [id number] BIRTHDATE: 17-Aug-1945 , 13  yrs. old GENDER: female ENDOSCOPIST: Ladene Artist, MD, [facility] PROCEDURE DATE:  04/15/2014 PROCEDURE:   Colonoscopy, surveillance and Colonoscopy with snare polypectomy First Screening Colonoscopy - Avg.  risk and is 50 yrs.  old or older - No.  Prior Negative Screening - Now for repeat screening. N/A  History of Adenoma - Now for follow-up colonoscopy & has been > or = to 3 yrs.  Yes hx of adenoma.  Has been 3 or more years since last colonoscopy.  History of Adenoma - Now for follow-up colonoscopy & has been > or = to 3 yrs. ASA CLASS:   Class III INDICATIONS:Surveillance due to prior colonic neoplasia and PH Colon Adenoma. MEDICATIONS: Monitored anesthesia care and Propofol 200 mg IV DESCRIPTION OF PROCEDURE:   After the risks benefits and alternatives of the procedure were thoroughly explained, informed consent was obtained.  The digital rectal exam revealed no abnormalities of the rectum.   The LB CF-H180AL Loaner [id number] endoscope was introduced through the anus and advanced to the cecum, which was identified by both the appendix and ileocecal valve. No adverse events experienced.   The quality of the prep was excellent.  (MoviPrep was used)  The instrument was then slowly withdrawn as the colon was fully examined.    COLON FINDINGS: Two sessile polyps measuring 5-6 mm in size were found in the descending colon.  A polypectomy was performed. Polypectomies were performed with a cold snare.  The resection was complete, the polyp tissue was completely retrieved and sent to histology.   The examination was otherwise normal.  Retroflexed views revealed internal Grade I hemorrhoids. The time to cecum = 2.4 Withdrawal time = 11.9   The scope was withdrawn and the procedure completed. COMPLICATIONS: There were no  immediate complications.  ENDOSCOPIC IMPRESSION: 1.   Two sessile polyps in the descending colon; polypectomies performed with a cold snare 2.   Grade l internal hemorrhoids  RECOMMENDATIONS: 1.  Await pathology results 2.  Repeat Colonoscopy in 5 years  eSigned:  Ladene Artist, MD, [facility] 04/15/2014 1:40 PM

## 2014-04-15 NOTE — Progress Notes (Signed)
Report to PACU, RN, vss, BBS= Clear.  

## 2014-04-18 ENCOUNTER — Telehealth: Payer: Self-pay

## 2014-04-18 NOTE — Telephone Encounter (Signed)
  Follow up Call-  Call back number 04/15/2014  Post procedure Call Back phone  # [phone] cell  Permission to leave phone message Yes     Patient questions:  Do you have a fever, pain , or abdominal swelling? No. Pain Score  0 *  Have you tolerated food without any problems? Yes.    Have you been able to return to your normal activities? Yes.    Do you have any questions about your discharge instructions: Diet   No. Medications  No. Follow up visit  No.  Do you have questions or concerns about your Care? No.  Actions: * If pain score is 4 or above: No action needed, pain <4.

## 2014-04-20 MED ORDER — CLINDAMYCIN PHOSPHATE 900 MG/50ML IV SOLN
900.0000 mg | INTRAVENOUS | Status: AC
  Administered 2014-04-21: 900 mg via INTRAVENOUS
  Filled 2014-04-20: qty 50

## 2014-04-20 MED ORDER — POVIDONE-IODINE 7.5 % EX SOLN
Freq: Once | CUTANEOUS | Status: DC
  Filled 2014-04-20: qty 118

## 2014-04-21 ENCOUNTER — Encounter (HOSPITAL_COMMUNITY)
Admission: RE | Disposition: A | Discharge status: Home / Self Care | Payer: Self-pay | Source: Ambulatory Visit | Attending: Orthopedic Surgery

## 2014-04-21 ENCOUNTER — Encounter (HOSPITAL_COMMUNITY): Payer: Self-pay | Admitting: *Deleted

## 2014-04-21 ENCOUNTER — Inpatient Hospital Stay (HOSPITAL_COMMUNITY): Payer: Medicare Other | Admitting: Anesthesiology

## 2014-04-21 ENCOUNTER — Inpatient Hospital Stay (HOSPITAL_COMMUNITY): Payer: Medicare Other

## 2014-04-21 ENCOUNTER — Inpatient Hospital Stay (HOSPITAL_COMMUNITY)
Admission: RE | Admit: 2014-04-21 | Discharge: 2014-04-23 | DRG: 483 | Disposition: A | Discharge status: Home / Self Care | Payer: Medicare Other | Source: Ambulatory Visit | Attending: Orthopedic Surgery | Admitting: Orthopedic Surgery

## 2014-04-21 DIAGNOSIS — Z86010 Personal history of colonic polyps: Secondary | ICD-10-CM

## 2014-04-21 DIAGNOSIS — E785 Hyperlipidemia, unspecified: Secondary | ICD-10-CM | POA: Diagnosis present

## 2014-04-21 DIAGNOSIS — Z881 Allergy status to other antibiotic agents status: Secondary | ICD-10-CM | POA: Diagnosis not present

## 2014-04-21 DIAGNOSIS — Z79899 Other long term (current) drug therapy: Secondary | ICD-10-CM | POA: Diagnosis not present

## 2014-04-21 DIAGNOSIS — Z96611 Presence of right artificial shoulder joint: Secondary | ICD-10-CM

## 2014-04-21 DIAGNOSIS — F329 Major depressive disorder, single episode, unspecified: Secondary | ICD-10-CM | POA: Diagnosis present

## 2014-04-21 DIAGNOSIS — M75101 Unspecified rotator cuff tear or rupture of right shoulder, not specified as traumatic: Secondary | ICD-10-CM | POA: Diagnosis not present

## 2014-04-21 DIAGNOSIS — K760 Fatty (change of) liver, not elsewhere classified: Secondary | ICD-10-CM | POA: Diagnosis not present

## 2014-04-21 DIAGNOSIS — Z888 Allergy status to other drugs, medicaments and biological substances status: Secondary | ICD-10-CM

## 2014-04-21 DIAGNOSIS — E119 Type 2 diabetes mellitus without complications: Secondary | ICD-10-CM | POA: Diagnosis present

## 2014-04-21 DIAGNOSIS — K219 Gastro-esophageal reflux disease without esophagitis: Secondary | ICD-10-CM | POA: Diagnosis present

## 2014-04-21 DIAGNOSIS — M19011 Primary osteoarthritis, right shoulder: Principal | ICD-10-CM | POA: Diagnosis present

## 2014-04-21 DIAGNOSIS — Z87891 Personal history of nicotine dependence: Secondary | ICD-10-CM | POA: Diagnosis not present

## 2014-04-21 DIAGNOSIS — I10 Essential (primary) hypertension: Secondary | ICD-10-CM | POA: Diagnosis present

## 2014-04-21 DIAGNOSIS — M25511 Pain in right shoulder: Secondary | ICD-10-CM | POA: Diagnosis present

## 2014-04-21 DIAGNOSIS — M12819 Other specific arthropathies, not elsewhere classified, unspecified shoulder: Secondary | ICD-10-CM | POA: Diagnosis present

## 2014-04-21 DIAGNOSIS — M75100 Unspecified rotator cuff tear or rupture of unspecified shoulder, not specified as traumatic: Secondary | ICD-10-CM | POA: Diagnosis present

## 2014-04-21 HISTORY — PX: REVERSE SHOULDER ARTHROPLASTY: SHX5054

## 2014-04-21 LAB — GLUCOSE, CAPILLARY
Glucose-Capillary: 169 mg/dL — ABNORMAL HIGH (ref 70–99)
Glucose-Capillary: 134 mg/dL — ABNORMAL HIGH (ref 70–99)
Glucose-Capillary: 146 mg/dL — ABNORMAL HIGH (ref 70–99)
Glucose-Capillary: 129 mg/dL — ABNORMAL HIGH (ref 70–99)

## 2014-04-21 SURGERY — ARTHROPLASTY, SHOULDER, TOTAL, REVERSE
Anesthesia: General | Site: Shoulder | Laterality: Right

## 2014-04-21 MED ORDER — FENTANYL CITRATE 0.05 MG/ML IJ SOLN
INTRAMUSCULAR | Status: DC | PRN
  Administered 2014-04-21: 100 ug via INTRAVENOUS

## 2014-04-21 MED ORDER — HYDROMORPHONE HCL 1 MG/ML IJ SOLN
INTRAMUSCULAR | Status: AC
  Administered 2014-04-21: 0.25 mg via INTRAVENOUS
  Filled 2014-04-21: qty 1

## 2014-04-21 MED ORDER — LOSARTAN POTASSIUM 50 MG PO TABS
50.0000 mg | ORAL_TABLET | Freq: Every day | ORAL | Status: DC
  Administered 2014-04-23: 50 mg via ORAL
  Filled 2014-04-21 (×2): qty 1

## 2014-04-21 MED ORDER — DEXTROSE 5 % IV SOLN
10.0000 mg | INTRAVENOUS | Status: DC | PRN
  Administered 2014-04-21: 10 ug/min via INTRAVENOUS

## 2014-04-21 MED ORDER — METOCLOPRAMIDE HCL 5 MG/ML IJ SOLN: 5.0000 mg | Freq: Three times a day (TID) | INTRAMUSCULAR | Status: DC | PRN

## 2014-04-21 MED ORDER — METFORMIN HCL 500 MG PO TABS
1000.0000 mg | ORAL_TABLET | Freq: Two times a day (BID) | ORAL | Status: DC
  Administered 2014-04-21 – 2014-04-23 (×4): 1000 mg via ORAL
  Filled 2014-04-21 (×4): qty 2

## 2014-04-21 MED ORDER — DOCUSATE SODIUM 100 MG PO CAPS
100.0000 mg | ORAL_CAPSULE | Freq: Two times a day (BID) | ORAL | Status: DC
  Administered 2014-04-21 – 2014-04-23 (×5): 100 mg via ORAL
  Filled 2014-04-21 (×5): qty 1

## 2014-04-21 MED ORDER — ONDANSETRON HCL 4 MG/2ML IJ SOLN
4.0000 mg | Freq: Four times a day (QID) | INTRAMUSCULAR | Status: DC | PRN
  Administered 2014-04-22: 4 mg via INTRAVENOUS
  Filled 2014-04-21: qty 2

## 2014-04-21 MED ORDER — PHENOL 1.4 % MT LIQD: 1.0000 | OROMUCOSAL | Status: DC | PRN

## 2014-04-21 MED ORDER — ATORVASTATIN CALCIUM 40 MG PO TABS
40.0000 mg | ORAL_TABLET | Freq: Every day | ORAL | Status: DC
  Administered 2014-04-21 – 2014-04-22 (×2): 40 mg via ORAL
  Filled 2014-04-21 (×2): qty 1

## 2014-04-21 MED ORDER — SUCCINYLCHOLINE CHLORIDE 20 MG/ML IJ SOLN
INTRAMUSCULAR | Status: AC
  Filled 2014-04-21: qty 1

## 2014-04-21 MED ORDER — OXYCODONE HCL 5 MG PO TABS
5.0000 mg | ORAL_TABLET | ORAL | Status: DC | PRN
  Administered 2014-04-21 (×3): 5 mg via ORAL
  Administered 2014-04-22 (×4): 10 mg via ORAL
  Administered 2014-04-22: 5 mg via ORAL
  Administered 2014-04-22 – 2014-04-23 (×3): 10 mg via ORAL
  Filled 2014-04-21 (×4): qty 2
  Filled 2014-04-21: qty 1
  Filled 2014-04-21 (×3): qty 2
  Filled 2014-04-21 (×2): qty 1
  Filled 2014-04-21: qty 2

## 2014-04-21 MED ORDER — DIPHENHYDRAMINE HCL 12.5 MG/5ML PO ELIX: 12.5000 mg | ORAL_SOLUTION | ORAL | Status: DC | PRN

## 2014-04-21 MED ORDER — VECURONIUM BROMIDE 10 MG IV SOLR
INTRAVENOUS | Status: DC | PRN
  Administered 2014-04-21: 3 mg via INTRAVENOUS
  Administered 2014-04-21: 2 mg via INTRAVENOUS

## 2014-04-21 MED ORDER — CLONAZEPAM 0.5 MG PO TABS
0.5000 mg | ORAL_TABLET | Freq: Two times a day (BID) | ORAL | Status: DC | PRN
  Administered 2014-04-21: 0.5 mg via ORAL
  Filled 2014-04-21: qty 1

## 2014-04-21 MED ORDER — CEFAZOLIN SODIUM-DEXTROSE 2-3 GM-% IV SOLR
INTRAVENOUS | Status: DC | PRN
  Administered 2014-04-21: 2 g via INTRAVENOUS

## 2014-04-21 MED ORDER — CEFAZOLIN SODIUM-DEXTROSE 2-3 GM-% IV SOLR
2.0000 g | Freq: Four times a day (QID) | INTRAVENOUS | Status: AC
  Administered 2014-04-21 – 2014-04-22 (×3): 2 g via INTRAVENOUS
  Filled 2014-04-21 (×3): qty 50

## 2014-04-21 MED ORDER — MIDAZOLAM HCL 2 MG/2ML IJ SOLN
INTRAMUSCULAR | Status: AC
  Filled 2014-04-21: qty 2

## 2014-04-21 MED ORDER — VECURONIUM BROMIDE 10 MG IV SOLR
INTRAVENOUS | Status: AC
  Filled 2014-04-21: qty 10

## 2014-04-21 MED ORDER — ONDANSETRON HCL 4 MG PO TABS: 4.0000 mg | ORAL_TABLET | Freq: Four times a day (QID) | ORAL | Status: DC | PRN

## 2014-04-21 MED ORDER — NEOSTIGMINE METHYLSULFATE 10 MG/10ML IV SOLN
INTRAVENOUS | Status: AC
  Filled 2014-04-21: qty 1

## 2014-04-21 MED ORDER — ONDANSETRON HCL 4 MG/2ML IJ SOLN
INTRAMUSCULAR | Status: AC
  Filled 2014-04-21: qty 2

## 2014-04-21 MED ORDER — MENTHOL 3 MG MT LOZG: 1.0000 | LOZENGE | OROMUCOSAL | Status: DC | PRN

## 2014-04-21 MED ORDER — SODIUM CHLORIDE 0.9 % IV SOLN
INTRAVENOUS | Status: DC
  Administered 2014-04-21: 12:00:00 via INTRAVENOUS

## 2014-04-21 MED ORDER — GLYCOPYRROLATE 0.2 MG/ML IJ SOLN
INTRAMUSCULAR | Status: AC
  Filled 2014-04-21: qty 3

## 2014-04-21 MED ORDER — INSULIN ASPART 100 UNIT/ML ~~LOC~~ SOLN
0.0000 [IU] | Freq: Three times a day (TID) | SUBCUTANEOUS | Status: DC
  Administered 2014-04-21 – 2014-04-22 (×2): 2 [IU] via SUBCUTANEOUS
  Administered 2014-04-22 – 2014-04-23 (×3): 3 [IU] via SUBCUTANEOUS

## 2014-04-21 MED ORDER — OXYCODONE HCL 5 MG/5ML PO SOLN: 5.0000 mg | Freq: Once | ORAL | Status: DC | PRN

## 2014-04-21 MED ORDER — ONDANSETRON HCL 4 MG/2ML IJ SOLN: 4.0000 mg | Freq: Once | INTRAMUSCULAR | Status: DC | PRN

## 2014-04-21 MED ORDER — HYDROMORPHONE HCL 1 MG/ML IJ SOLN
0.2500 mg | INTRAMUSCULAR | Status: DC | PRN
  Administered 2014-04-21: 0.25 mg via INTRAVENOUS
  Administered 2014-04-21: 0.5 mg via INTRAVENOUS

## 2014-04-21 MED ORDER — ALUMINUM HYDROXIDE GEL 320 MG/5ML PO SUSP: 15.0000 mL | ORAL | Status: DC | PRN

## 2014-04-21 MED ORDER — SODIUM CHLORIDE 0.9 % IR SOLN
Status: DC | PRN
  Administered 2014-04-21: 3000 mL
  Administered 2014-04-21: 1000 mL

## 2014-04-21 MED ORDER — ONDANSETRON HCL 4 MG/2ML IJ SOLN
INTRAMUSCULAR | Status: DC | PRN
  Administered 2014-04-21: 4 mg via INTRAVENOUS

## 2014-04-21 MED ORDER — FLEET ENEMA 7-19 GM/118ML RE ENEM: 1.0000 | ENEMA | Freq: Once | RECTAL | Status: AC | PRN

## 2014-04-21 MED ORDER — ALBUMIN HUMAN 5 % IV SOLN
INTRAVENOUS | Status: DC | PRN
  Administered 2014-04-21: 08:00:00 via INTRAVENOUS

## 2014-04-21 MED ORDER — ARIPIPRAZOLE 5 MG PO TABS
2.5000 mg | ORAL_TABLET | Freq: Every day | ORAL | Status: DC
  Administered 2014-04-22 – 2014-04-23 (×2): 2.5 mg via ORAL
  Filled 2014-04-21 (×2): qty 1

## 2014-04-21 MED ORDER — LIDOCAINE HCL (CARDIAC) 20 MG/ML IV SOLN
INTRAVENOUS | Status: AC
  Filled 2014-04-21: qty 10

## 2014-04-21 MED ORDER — ACETAMINOPHEN 650 MG RE SUPP: 650.0000 mg | Freq: Four times a day (QID) | RECTAL | Status: DC | PRN

## 2014-04-21 MED ORDER — OXYCODONE HCL 5 MG PO TABS: 5.0000 mg | ORAL_TABLET | Freq: Once | ORAL | Status: DC | PRN

## 2014-04-21 MED ORDER — ZOLPIDEM TARTRATE 5 MG PO TABS: 5.0000 mg | ORAL_TABLET | Freq: Every evening | ORAL | Status: DC | PRN

## 2014-04-21 MED ORDER — PHENYLEPHRINE 40 MCG/ML (10ML) SYRINGE FOR IV PUSH (FOR BLOOD PRESSURE SUPPORT)
PREFILLED_SYRINGE | INTRAVENOUS | Status: AC
  Filled 2014-04-21: qty 10

## 2014-04-21 MED ORDER — NEOSTIGMINE METHYLSULFATE 10 MG/10ML IV SOLN
INTRAVENOUS | Status: DC | PRN
Start: 2014-04-21 — End: 2014-04-21
  Administered 2014-04-21: 4 mg via INTRAVENOUS

## 2014-04-21 MED ORDER — GABAPENTIN 100 MG PO CAPS
100.0000 mg | ORAL_CAPSULE | Freq: Every day | ORAL | Status: DC
  Administered 2014-04-21 – 2014-04-22 (×2): 100 mg via ORAL
  Filled 2014-04-21 (×2): qty 1

## 2014-04-21 MED ORDER — FENTANYL CITRATE 0.05 MG/ML IJ SOLN
INTRAMUSCULAR | Status: AC
  Filled 2014-04-21: qty 5

## 2014-04-21 MED ORDER — DULOXETINE HCL 60 MG PO CPEP
60.0000 mg | ORAL_CAPSULE | Freq: Every day | ORAL | Status: DC
  Administered 2014-04-22 – 2014-04-23 (×2): 60 mg via ORAL
  Filled 2014-04-21 (×2): qty 1

## 2014-04-21 MED ORDER — BISACODYL 10 MG RE SUPP: 10.0000 mg | Freq: Every day | RECTAL | Status: DC | PRN

## 2014-04-21 MED ORDER — EPHEDRINE SULFATE 50 MG/ML IJ SOLN
INTRAMUSCULAR | Status: AC
  Filled 2014-04-21: qty 1

## 2014-04-21 MED ORDER — ACETAMINOPHEN 325 MG PO TABS: 650.0000 mg | ORAL_TABLET | Freq: Four times a day (QID) | ORAL | Status: DC | PRN

## 2014-04-21 MED ORDER — GLYCOPYRROLATE 0.2 MG/ML IJ SOLN
INTRAMUSCULAR | Status: DC | PRN
  Administered 2014-04-21: .7 mg via INTRAVENOUS

## 2014-04-21 MED ORDER — ATENOLOL 50 MG PO TABS
50.0000 mg | ORAL_TABLET | Freq: Every day | ORAL | Status: DC
  Administered 2014-04-23: 50 mg via ORAL
  Filled 2014-04-21 (×2): qty 1

## 2014-04-21 MED ORDER — STERILE WATER FOR INJECTION IJ SOLN
INTRAMUSCULAR | Status: AC
  Filled 2014-04-21: qty 10

## 2014-04-21 MED ORDER — METOCLOPRAMIDE HCL 5 MG PO TABS
5.0000 mg | ORAL_TABLET | Freq: Three times a day (TID) | ORAL | Status: DC | PRN
Start: 2014-04-21 — End: 2014-04-23

## 2014-04-21 MED ORDER — SUCCINYLCHOLINE CHLORIDE 20 MG/ML IJ SOLN
INTRAMUSCULAR | Status: DC | PRN
  Administered 2014-04-21: 120 mg via INTRAVENOUS

## 2014-04-21 MED ORDER — ASPIRIN EC 325 MG PO TBEC
325.0000 mg | DELAYED_RELEASE_TABLET | Freq: Two times a day (BID) | ORAL | Status: DC
  Administered 2014-04-21 – 2014-04-23 (×5): 325 mg via ORAL
  Filled 2014-04-21 (×5): qty 1

## 2014-04-21 MED ORDER — POLYETHYLENE GLYCOL 3350 17 G PO PACK: 17.0000 g | PACK | Freq: Every day | ORAL | Status: DC | PRN

## 2014-04-21 MED ORDER — LIDOCAINE HCL (CARDIAC) 20 MG/ML IV SOLN
INTRAVENOUS | Status: DC | PRN
  Administered 2014-04-21: 75 mg via INTRAVENOUS

## 2014-04-21 MED ORDER — MIDAZOLAM HCL 5 MG/5ML IJ SOLN
INTRAMUSCULAR | Status: DC | PRN
  Administered 2014-04-21: 2 mg via INTRAVENOUS

## 2014-04-21 MED ORDER — PROPOFOL 10 MG/ML IV BOLUS
INTRAVENOUS | Status: DC | PRN
  Administered 2014-04-21: 160 mg via INTRAVENOUS

## 2014-04-21 MED ORDER — ACETAMINOPHEN 500 MG PO TABS
1000.0000 mg | ORAL_TABLET | Freq: Four times a day (QID) | ORAL | Status: AC
  Administered 2014-04-21 – 2014-04-22 (×4): 1000 mg via ORAL
  Filled 2014-04-21 (×6): qty 2

## 2014-04-21 MED ORDER — HYDROCODONE-ACETAMINOPHEN 7.5-325 MG PO TABS
1.0000 | ORAL_TABLET | Freq: Three times a day (TID) | ORAL | Status: DC | PRN
Start: 2014-04-21 — End: 2014-04-23

## 2014-04-21 MED ORDER — MORPHINE SULFATE 2 MG/ML IJ SOLN
1.0000 mg | INTRAMUSCULAR | Status: DC | PRN
  Administered 2014-04-22 (×4): 2 mg via INTRAVENOUS
  Filled 2014-04-21 (×4): qty 1

## 2014-04-21 MED ORDER — LACTATED RINGERS IV SOLN
INTRAVENOUS | Status: DC | PRN
  Administered 2014-04-21 (×2): via INTRAVENOUS

## 2014-04-21 MED ORDER — PROPOFOL 10 MG/ML IV BOLUS
INTRAVENOUS | Status: AC
  Filled 2014-04-21: qty 20

## 2014-04-21 SURGICAL SUPPLY — 72 items
BLADE SAW SAG 73X25 THK (BLADE) ×1
BLADE SAW SGTL 73X25 THK (BLADE) ×1 IMPLANT
BLADE SURG 15 STRL LF DISP TIS (BLADE) ×1 IMPLANT
BLADE SURG 15 STRL SS (BLADE) ×1
BOWL SMART MIX CTS (DISPOSABLE) IMPLANT
CAPT SHLDR REVTOTAL 2 ALTIVATE ×2 IMPLANT
CHLORAPREP W/TINT 26ML (MISCELLANEOUS) ×2 IMPLANT
CLSR STERI-STRIP ANTIMIC 1/2X4 (GAUZE/BANDAGES/DRESSINGS) ×2 IMPLANT
COVER MAYO STAND STRL (DRAPES) IMPLANT
COVER SURGICAL LIGHT HANDLE (MISCELLANEOUS) ×2 IMPLANT
DRAPE INCISE IOBAN 66X45 STRL (DRAPES) ×4 IMPLANT
DRAPE ORTHO SPLIT 77X108 STRL (DRAPES) ×2
DRAPE SURG 17X23 STRL (DRAPES) ×2 IMPLANT
DRAPE SURG ORHT 6 SPLT 77X108 (DRAPES) ×2 IMPLANT
DRAPE U-SHAPE 47X51 STRL (DRAPES) ×2 IMPLANT
DRILL BIT 5/64 (BIT) ×2 IMPLANT
DRSG AQUACEL AG ADV 3.5X10 (GAUZE/BANDAGES/DRESSINGS) ×2 IMPLANT
ELECT BLADE 4.0 EZ CLEAN MEGAD (MISCELLANEOUS) ×2
ELECT REM PT RETURN 9FT ADLT (ELECTROSURGICAL) ×2
ELECTRODE BLDE 4.0 EZ CLN MEGD (MISCELLANEOUS) ×1 IMPLANT
ELECTRODE REM PT RTRN 9FT ADLT (ELECTROSURGICAL) ×1 IMPLANT
EVACUATOR 1/8 PVC DRAIN (DRAIN) IMPLANT
GLOVE BIO SURGEON STRL SZ7 (GLOVE) ×2 IMPLANT
GLOVE BIO SURGEON STRL SZ7.5 (GLOVE) ×2 IMPLANT
GLOVE BIOGEL PI IND STRL 7.0 (GLOVE) ×1 IMPLANT
GLOVE BIOGEL PI IND STRL 8 (GLOVE) ×1 IMPLANT
GLOVE BIOGEL PI INDICATOR 7.0 (GLOVE) ×1
GLOVE BIOGEL PI INDICATOR 8 (GLOVE) ×1
GLOVE SS BIOGEL STRL SZ 7.5 (GLOVE) ×1 IMPLANT
GLOVE SUPERSENSE BIOGEL SZ 7.5 (GLOVE) ×1
GOWN STRL REUS W/ TWL LRG LVL3 (GOWN DISPOSABLE) ×3 IMPLANT
GOWN STRL REUS W/ TWL XL LVL3 (GOWN DISPOSABLE) ×1 IMPLANT
GOWN STRL REUS W/TWL LRG LVL3 (GOWN DISPOSABLE) ×3
GOWN STRL REUS W/TWL XL LVL3 (GOWN DISPOSABLE) ×1
HANDPIECE INTERPULSE COAX TIP (DISPOSABLE) ×1
HOOD PEEL AWAY FACE SHEILD DIS (HOOD) ×6 IMPLANT
KIT BASIN OR (CUSTOM PROCEDURE TRAY) ×2 IMPLANT
KIT ROOM TURNOVER OR (KITS) ×2 IMPLANT
MANIFOLD NEPTUNE II (INSTRUMENTS) ×2 IMPLANT
NEEDLE HYPO 25GX1X1/2 BEV (NEEDLE) ×4 IMPLANT
NEEDLE MAYO TROCAR (NEEDLE) IMPLANT
NOZZLE PRISM 8.5MM (MISCELLANEOUS) IMPLANT
NS IRRIG 1000ML POUR BTL (IV SOLUTION) ×2 IMPLANT
PACK SHOULDER (CUSTOM PROCEDURE TRAY) ×2 IMPLANT
PAD ARMBOARD 7.5X6 YLW CONV (MISCELLANEOUS) ×4 IMPLANT
RETRIEVER SUT HEWSON (MISCELLANEOUS) ×2 IMPLANT
SET HNDPC FAN SPRY TIP SCT (DISPOSABLE) ×1 IMPLANT
SLING ARM FOAM STRAP LRG (SOFTGOODS) ×2 IMPLANT
SLING ARM IMMOBILIZER LRG (SOFTGOODS) ×2 IMPLANT
SLING ARM IMMOBILIZER MED (SOFTGOODS) ×2 IMPLANT
SPONGE LAP 18X18 X RAY DECT (DISPOSABLE) ×2 IMPLANT
SPONGE LAP 4X18 X RAY DECT (DISPOSABLE) ×2 IMPLANT
STRIP CLOSURE SKIN 1/2X4 (GAUZE/BANDAGES/DRESSINGS) ×2 IMPLANT
SUCTION FRAZIER TIP 10 FR DISP (SUCTIONS) ×2 IMPLANT
SUPPORT WRAP ARM LG (MISCELLANEOUS) ×2 IMPLANT
SUT ETHIBOND NAB CT1 #1 30IN (SUTURE) IMPLANT
SUT FIBERWIRE #2 38 T-5 BLUE (SUTURE)
SUT MNCRL AB 4-0 PS2 18 (SUTURE) ×2 IMPLANT
SUT SILK 2 0 TIES 17X18 (SUTURE)
SUT SILK 2-0 18XBRD TIE BLK (SUTURE) IMPLANT
SUT VIC AB 0 CTB1 27 (SUTURE) ×2 IMPLANT
SUT VIC AB 2-0 CT1 27 (SUTURE) ×1
SUT VIC AB 2-0 CT1 TAPERPNT 27 (SUTURE) ×1 IMPLANT
SUTURE FIBERWR #2 38 T-5 BLUE (SUTURE) IMPLANT
SYR CONTROL 10ML LL (SYRINGE) IMPLANT
SYRINGE TOOMEY DISP (SYRINGE) IMPLANT
SYS SHLDR CAPITATED REV 2 ×1 IMPLANT
TAPE FIBER 2MM 7IN #2 BLUE (SUTURE) IMPLANT
TOWEL OR 17X24 6PK STRL BLUE (TOWEL DISPOSABLE) ×2 IMPLANT
TOWEL OR 17X26 10 PK STRL BLUE (TOWEL DISPOSABLE) ×2 IMPLANT
WATER STERILE IRR 1000ML POUR (IV SOLUTION) ×2 IMPLANT
YANKAUER SUCT BULB TIP NO VENT (SUCTIONS) IMPLANT

## 2014-04-21 NOTE — Transfer of Care (Signed)
Immediate Anesthesia Transfer of Care Note  Patient: [name]  Procedure(s) Performed: Procedure(s) with comments: REVERSE SHOULDER ARTHROPLASTY (Right) - Right reverse total shoulder replacement  Patient Location: PACU  Anesthesia Type:GA combined with regional for post-op pain  Level of Consciousness: awake and alert   Airway & Oxygen Therapy: Patient Spontanous Breathing and Patient connected to nasal cannula oxygen  Post-op Assessment: Report given to RN and Post -op Vital signs reviewed and stable  Post vital signs: Reviewed and stable  Last Vitals:  Filed Vitals:   04/21/14 0708  BP: 140/67  Pulse: 64  Temp: 36.9 C  Resp: 17    Complications: No apparent anesthesia complications

## 2014-04-21 NOTE — H&P (Signed)
[name] is an 69 y.o. female.   Chief Complaint: R shoulder pain and dysfunction HPI: H/o R shoulder pain and dysfunction with severe arthritis and associated RCT.  Failed conservative management with activity modification, NSAIDS, injections.  Past Medical History  Diagnosis Date  . Diabetes mellitus     dr Buddy Duty  . Depression     sees Dr.Cotle  . Hyperlipemia   . Hypertension   . IBS (irritable bowel syndrome)     and dyspepsia  . RLS (restless legs syndrome)     h/o- no longer using Requip  . Adenomatous colon polyp 03/1988  . Osteopenia     dexa 06/2007 and 10/11 Rx CA vitamin d  . Chest pain      Sept 2011: stress test neg  . Diverticulosis   . Fatty liver     Increased LFTs, saw GI 06/2011, likely from fatty liver   . Adenomatous colon polyp 1990  . Anemia     borderline  . Cataract     no surgery  . GERD (gastroesophageal reflux disease)   . History of hiatal hernia   . Arthritis     R shoulder - degenerative     Past Surgical History  Procedure Laterality Date  . Uterine fibroid embolization      90s  . Tonsillectomy    . Polypectomy    . Colonoscopy      Family History  Problem Relation Age of Onset  . Lung cancer Mother     smoker  . Ovarian cancer Paternal Aunt     ?  Marland Kitchen Breast cancer Neg Hx   . Rectal cancer Neg Hx   . Stomach cancer Neg Hx   . Esophageal cancer Neg Hx   . Diabetes Other     aunts-uncles   . Colon cancer Father     F dx in his 26s  . Heart attack Other     GM in her 77s  . Stroke Other     aunts-uncles    Social History:  reports that she quit smoking about 38 years ago. Her smoking use included Cigarettes. She has a 20 pack-year smoking history. She has never used smokeless tobacco. She reports that she drinks alcohol. She reports that she does not use illicit drugs.  Allergies:  Allergies  Allergen Reactions  . Penicillins     REACTION: unspecified RASH  . Sulfa Antibiotics Nausea Only  . Trulicity [Dulaglutide]  Nausea Only    Medications Prior to Admission  Medication Sig Dispense Refill  . acetaminophen (TYLENOL) 500 MG tablet Take 500 mg by mouth every 6 (six) hours as needed.    . ARIPiprazole (ABILIFY) 5 MG tablet Take 2.5 mg by mouth daily before breakfast.     . aspirin 81 MG tablet Take 81 mg by mouth daily.    Marland Kitchen atenolol (TENORMIN) 50 MG tablet TAKE 1 TABLET DAILY (Patient taking differently: TAKE 1 TABLET DAILY    in the a.m.) 30 tablet 5  . atorvastatin (LIPITOR) 40 MG tablet Take 40 mg by mouth daily at 6 PM.     . B-D ULTRAFINE III SHORT PEN 31G X 8 MM MISC USE AS DIRECTED 100 each 2  . DULoxetine (CYMBALTA) 60 MG capsule Take 60 mg by mouth daily before breakfast.     . gabapentin (NEURONTIN) 100 MG capsule Take 1 capsule (100 mg total) by mouth at bedtime. 30 capsule 3  . glucose blood (ONE TOUCH ULTRA TEST)  test strip Check twice a day 200 each 3  . HYDROcodone-acetaminophen (NORCO) 7.5-325 MG per tablet Take 1 tablet by mouth every 8 (eight) hours as needed for moderate pain (cough). 30 tablet 0  . Insulin Detemir (LEVEMIR FLEXTOUCH) 100 UNIT/ML Pen INJECT 0.5 ML INTO SKIN DAILY AS DIRECTED (Patient taking differently: Inject 80 Units into the skin daily at 10 pm. INJECT 0.5 ML INTO SKIN DAILY AS DIRECTED) 15 pen 3  . losartan (COZAAR) 50 MG tablet TAKE 1 TABLET (50 MG TOTAL) BY MOUTH DAILY. (Patient taking differently: TAKE 1 TABLET (50 MG TOTAL) BY MOUTH DAILY.  (after dinner)) 30 tablet 5  . metFORMIN (GLUCOPHAGE) 1000 MG tablet Take 1 tablet (1,000 mg total) by mouth 2 (two) times daily with a meal. 180 tablet 3  . omeprazole (PRILOSEC) 20 MG capsule TAKE ONE CAPSULE BY MOUTH EVERY DAY (Patient taking differently: TAKE ONE CAPSULE BY MOUTH EVERY DAY   in the evening) 30 capsule 5  . traMADol (ULTRAM) 50 MG tablet Take 1 tablet (50 mg total) by mouth at bedtime as needed. 30 tablet 0  . Diclofenac Sodium 2 % SOLN Apply twice daily. 112 g 1    No results found for this or any  previous visit (from the past 48 hour(s)). No results found.  Review of Systems  All other systems reviewed and are negative.   Blood pressure 140/67, pulse 64, temperature 98.5 F (36.9 C), temperature source Oral, resp. rate 17, height 5' 6.5" (1.689 m), weight 96.616 kg (213 lb), SpO2 95 %. Physical Exam  Constitutional: She is oriented to person, place, and time. She appears well-developed and well-nourished.  HENT:  Head: Atraumatic.  Eyes: EOM are normal.  Cardiovascular: Intact distal pulses.   Respiratory: Effort normal.  Musculoskeletal:  R shoulder pain with limited ROM.  Neurological: She is alert and oriented to person, place, and time.  Skin: Skin is warm and dry.  Psychiatric: She has a normal mood and affect.     Assessment/Plan R shoulder pain and dysfunction with severe arthritis and associated RCT Plan R shoulder reverse TSA Risks / benefits of surgery discussed Consent on chart  NPO for OR Preop antibiotics   [name] 04/21/2014, 7:15 AM

## 2014-04-21 NOTE — Progress Notes (Signed)
Utilization review completed.  

## 2014-04-21 NOTE — Plan of Care (Signed)
Problem: Consults Goal: Diagnosis - Shoulder Surgery Reverse Total Shoulder Arthroplasty Right

## 2014-04-21 NOTE — Anesthesia Procedure Notes (Addendum)
Procedure Name: Intubation Date/Time: 04/21/2014 7:51 AM Performed by: Eligha Bridegroom Pre-anesthesia Checklist: Patient identified, Timeout performed, Emergency Drugs available, Suction available and Patient being monitored Patient Re-evaluated:Patient Re-evaluated prior to inductionOxygen Delivery Method: Circle system utilized Preoxygenation: Pre-oxygenation with 100% oxygen Intubation Type: IV induction and Cricoid Pressure applied Ventilation: Mask ventilation without difficulty Laryngoscope Size: Mac and 4 Grade View: Grade II Tube type: Oral Tube size: 7.0 mm Number of attempts: 1 Airway Equipment and Method: Stylet and LTA kit utilized Placement Confirmation: ETT inserted through vocal cords under direct vision,  breath sounds checked- equal and bilateral and positive ETCO2 Secured at: 21 cm Tube secured with: Tape Dental Injury: Teeth and Oropharynx as per pre-operative assessment    Anesthesia Regional Block:  Interscalene brachial plexus block  Pre-Anesthetic Checklist: ,, timeout performed, Correct Patient, Correct Site, Correct Laterality, Correct Procedure, Correct Position, site marked, Risks and benefits discussed,  Surgical consent,  Pre-op evaluation,  At surgeon's request and post-op pain management  Laterality: Right  Prep: chloraprep       Needles:  Injection technique: Single-shot  Needle Type: Echogenic Stimulator Needle     Needle Length: 9cm 9 cm Needle Gauge: 22 and 22 G    Additional Needles: Interscalene brachial plexus block Narrative:  Start time: 04/21/2014 7:20 AM End time: 04/21/2014 7:25 AM Injection made incrementally with aspirations every 5 mL.  Performed by: Personally   Additional Notes: 25 cc 0.5% Marcaine with 1:200 epi injected easily

## 2014-04-21 NOTE — Op Note (Signed)
Procedure(s): REVERSE SHOULDER ARTHROPLASTY Procedure Note  [name] female 69 y.o. 04/21/2014  Procedure(s) and Anesthesia Type:    * RIGHT REVERSE SHOULDER ARTHROPLASTY - General   Indications:  69 y.o. 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 Pain and dysfunction interfered with quality of life and nonoperative treatment with activity modification, NSAIDS and injections failed.     Surgeon: Nita Sells   Assistants: Jeanmarie Hubert PA-C [facility] was present and scrubbed throughout the procedure and was essential in positioning, retraction, exposure, and closure)  Anesthesia: General endotracheal anesthesia with preoperative interscalene block given by the attending anesthesiologist    Procedure Detail  REVERSE SHOULDER ARTHROPLASTY   Estimated Blood Loss:  200 mL         Drains: none  Blood Given: none          Specimens: none        Complications:  * No complications entered in OR log *         Disposition: PACU - hemodynamically stable.         Condition: stable      OPERATIVE FINDINGS:  A DJO pressfit reverse total shoulder arthroplasty was placed with a  size 10 stem, a 32-4 glenosphere, and a standard poly insert. The base plate  fixation was excellent. Subscapularis was repaired. She was noted to have a small rotator cuff tear but the anterior supraspinatus was actually still intact.  PROCEDURE: The patient was identified in the preoperative holding area  where I personally marked the operative site after verifying site, side,  and procedure with the patient. An interscalene block given by  the attending anesthesiologist in the holding area and the patient was taken back to the operating room where all extremities were  carefully padded in position after general anesthesia was induced. She  was placed in a beach-chair position and the operative upper extremity was  prepped and draped in a  standard sterile fashion. An approximately 10-  cm incision was made from the tip of the coracoid process to the center  point of the humerus at the level of the axilla. Dissection was carried  down through subcutaneous tissues to the level of the cephalic vein  which was taken laterally with the deltoid. The pectoralis major was  retracted medially. The subdeltoid space was developed and the lateral  edge of the conjoined tendon was identified. The undersurface of  conjoined tendon was palpated and the musculocutaneous nerve was not in  the field. Retractor was placed underneath the conjoined and second  retractor was placed lateral into the deltoid. The circumflex humeral  artery and vessels were identified and clamped and coagulated. The  biceps tendon was released from the superior labrum and then resected.  The subscapularis was taken down with a peel from the lesser tuberosity and one sheath with the underlying capsule. It was tagged for later repair..  The  joint was then gently externally rotated while the capsule was released  from the humeral neck around to just beyond the 6 o'clock position. At  this point, the joint was dislocated and the humeral head was presented  into the wound. The excessive osteophyte formation was removed with a  large rongeur.  The cutting guide was used to make the appropriate  head cut and the head was saved for potentially bone grafting.  The glenoid was exposed with the arm in an  abducted extended position. The anterior and posterior labrum were  completely excised and the capsule was released circumferentially to  allow for exposure of the glenoid for preparation. The guide was used to place the 2.5 drill In 10 inferior angulation followed by the which was left in place. The 2 reamers were used to ream down to concentric surface. The peripheral border was cleared. The screw in glenoid base plate was then screwed in with excellent purchase. The drill  guide was then placed and all 4 peripheral holes were drilled and measured. Locking screws were then placed through the guide. A 32-4 trial head was placed. The humerus was then again exposed and the metaphysis was reamed followed by diaphyseal reamers up to a size 10. The size 8 followed by the size 10 broach were placed with good press fit. The trial tray was then placed and the poly trials were tested and the above implant was felt to be the most appropriate soft tissue tensioning with excellent stability and  excellent range of motion. Therefore, the glenoid was again exposed and the final 32-4 head ball was impacted and secured with the screw. The proximal humerus was then again exposed and the final size 10 press-fit implant was placed with excellent press-fit and 30 of retroversion. The standard poly-trial was again placed and taken through range of motion with excellent stability and range of motion. The final poly-, standard, was placed and impacted. The joint was then reduced and had appropriate range of motion and stability.  Soft tissue tension was excellent. The joint was then copiously irrigated with pulse  lavage and the wound was then closed. The subscapularis was repaired through bone tunnels in the lesser tuberosity with FiberWire suture.  One additional Ethibond suture was placed in the rotator interval. Skin was closed with 2-0 Vicryl in a deep dermal layer and 4-0  Monocryl for skin closure. Steri-Strips were applied. Sterile  dressings were then applied as well as a sling. The patient was allowed  to awaken from general anesthesia, transferred to stretcher, and taken  to recovery room in stable condition.   POSTOPERATIVE PLAN: The patient will be kept in the hospital postoperatively  for pain control and therapy.

## 2014-04-21 NOTE — Anesthesia Postprocedure Evaluation (Signed)
  Anesthesia Post-op Note  Patient: [name]  Procedure(s) Performed: Procedure(s) with comments: REVERSE SHOULDER ARTHROPLASTY (Right) - Right reverse total shoulder replacement  Patient Location: PACU  Anesthesia Type:General and GA combined with regional for post-op pain  Level of Consciousness: awake, alert  and oriented  Airway and Oxygen Therapy: Patient Spontanous Breathing and Patient connected to nasal cannula oxygen  Post-op Pain: none  Post-op Assessment: Post-op Vital signs reviewed, Patient's Cardiovascular Status Stable, Respiratory Function Stable and Pain level controlled  Post-op Vital Signs: stable  Last Vitals:  Filed Vitals:   04/21/14 1015  BP: 116/57  Pulse: 72  Temp: 36.7 C  Resp: 14    Complications: No apparent anesthesia complications

## 2014-04-21 NOTE — Anesthesia Preprocedure Evaluation (Signed)
Anesthesia Evaluation  Patient identified by MRN, date of birth, ID band Patient awake    Reviewed: Allergy & Precautions, NPO status , Patient's Chart, lab work & pertinent test results  Airway Mallampati: II  TM Distance: >3 FB     Dental  (+) Teeth Intact   Pulmonary former smoker,  breath sounds clear to auscultation        Cardiovascular hypertension, Rhythm:Regular     Neuro/Psych    GI/Hepatic   Endo/Other  diabetes  Renal/GU      Musculoskeletal   Abdominal   Peds  Hematology   Anesthesia Other Findings   Reproductive/Obstetrics                             Anesthesia Physical Anesthesia Plan  ASA: III  Anesthesia Plan: General   Post-op Pain Management: MAC Combined w/ Regional for Post-op pain   Induction: Intravenous  Airway Management Planned: Oral ETT  Additional Equipment:   Intra-op Plan:   Post-operative Plan: Extubation in OR  Informed Consent: I have reviewed the patients History and Physical, chart, labs and discussed the procedure including the risks, benefits and alternatives for the proposed anesthesia with the patient or authorized representative who has indicated his/her understanding and acceptance.   Dental advisory given  Plan Discussed with: CRNA and Anesthesiologist  Anesthesia Plan Comments:         Anesthesia Quick Evaluation

## 2014-04-22 LAB — BASIC METABOLIC PANEL
Anion gap: 11 (ref 5–15)
BUN: 7 mg/dL (ref 6–23)
CO2: 23 mmol/L (ref 19–32)
Calcium: 8.1 mg/dL — ABNORMAL LOW (ref 8.4–10.5)
Chloride: 97 mmol/L (ref 96–112)
Creatinine, Ser: 0.75 mg/dL (ref 0.50–1.10)
GFR calc Af Amer: >90 mL/min (ref >90)
GFR calc non Af Amer: 84 mL/min — ABNORMAL LOW (ref >90)
Glucose, Bld: 188 mg/dL — ABNORMAL HIGH (ref 70–99)
Potassium: 3.8 mmol/L (ref 3.5–5.1)
Sodium: 131 mmol/L — ABNORMAL LOW (ref 135–145)

## 2014-04-22 LAB — CBC
HCT: 30.8 % — ABNORMAL LOW (ref 36.0–46.0)
Hemoglobin: 10 g/dL — ABNORMAL LOW (ref 12.0–15.0)
MCH: 26.3 pg (ref 26.0–34.0)
MCHC: 32.5 g/dL (ref 30.0–36.0)
MCV: 81.1 fL (ref 78.0–100.0)
Platelets: 155 10*3/uL (ref 150–400)
RBC: 3.8 MIL/uL — ABNORMAL LOW (ref 3.87–5.11)
RDW: 14.5 % (ref 11.5–15.5)
WBC: 9.9 10*3/uL (ref 4.0–10.5)

## 2014-04-22 LAB — GLUCOSE, CAPILLARY
Glucose-Capillary: 176 mg/dL — ABNORMAL HIGH (ref 70–99)
Glucose-Capillary: 187 mg/dL — ABNORMAL HIGH (ref 70–99)
Glucose-Capillary: 145 mg/dL — ABNORMAL HIGH (ref 70–99)
Glucose-Capillary: 163 mg/dL — ABNORMAL HIGH (ref 70–99)

## 2014-04-22 MED ORDER — DOCUSATE SODIUM 100 MG PO CAPS: 100.0000 mg | ORAL_CAPSULE | Freq: Three times a day (TID) | ORAL | Status: DC | PRN

## 2014-04-22 MED ORDER — OXYCODONE-ACETAMINOPHEN 5-325 MG PO TABS: 1.0000 | ORAL_TABLET | ORAL | Status: DC | PRN

## 2014-04-22 NOTE — Discharge Instructions (Signed)

## 2014-04-22 NOTE — Progress Notes (Signed)
   PATIENT ID: [name]   1 Day Post-Op Procedure(s) (LRB): REVERSE SHOULDER ARTHROPLASTY (Right)  Subjective: Right shoulder with pain overnight when block wore off but catching up and more tolerable now. No other problems or concerns.   Objective:  Filed Vitals:   04/22/14 0513  BP: 112/58  Pulse: 89  Temp: 99.3 F (37.4 C)  Resp: 16     R UE dressing c/d/i Sling Wiggles fingers, mod swelling in hand, distally NVI  Labs:   Recent Labs  04/22/14 0549  HGB 10.0*   Recent Labs  04/22/14 0549  WBC 9.9  RBC 3.80*  HCT 30.8*  PLT 155   Recent Labs  04/22/14 0549  NA 131*  K 3.8  CL 97  CO2 23  BUN 7  CREATININE 0.75  GLUCOSE 188*  CALCIUM 8.1*    Assessment and Plan: 1 day s/p right reverse TSA OT today, hand wrist elbow only D/c today when pain controlled Percocet for home rx, in chart Fu Dr. Tamera Punt in 2 weeks   VTE proph: ASA 325mg  BID, SCDs

## 2014-04-22 NOTE — Discharge Summary (Addendum)
Patient ID: [name] MRN: [id number] DOB/AGE: 03-19-1945 69 y.o.  Admit date: 04/21/2014 Discharge date: 04/23/14  Admission Diagnoses:  Active Problems:   Rotator cuff tear arthropathy   Discharge Diagnoses:  Same  Past Medical History  Diagnosis Date  . Diabetes mellitus     dr Buddy Duty  . Depression     sees Dr.Cotle  . Hyperlipemia   . Hypertension   . IBS (irritable bowel syndrome)     and dyspepsia  . RLS (restless legs syndrome)     h/o- no longer using Requip  . Adenomatous colon polyp 03/1988  . Osteopenia     dexa 06/2007 and 10/11 Rx CA vitamin d  . Chest pain      Sept 2011: stress test neg  . Diverticulosis   . Fatty liver     Increased LFTs, saw GI 06/2011, likely from fatty liver   . Adenomatous colon polyp 1990  . Anemia     borderline  . Cataract     no surgery  . GERD (gastroesophageal reflux disease)   . History of hiatal hernia   . Arthritis     R shoulder - degenerative     Surgeries: Procedure(s): REVERSE SHOULDER ARTHROPLASTY on 04/21/2014   Consultants:    Discharged Condition: Improved  Hospital Course: [name] is an 69 y.o. 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 Patient has severe unremitting pain that affects sleep, daily activities, and work/hobbies. After pre-op clearance the patient was taken to the operating room on 04/21/2014 and underwent  Procedure(s): [place].    Patient was given perioperative antibiotics: Anti-infectives    Start     Dose/Rate Route Frequency Ordered Stop   04/21/14 1400  ceFAZolin (ANCEF) IVPB 2 g/50 mL premix     2 g 100 mL/hr over 30 Minutes Intravenous Every 6 hours 04/21/14 1155 04/22/14 0205   04/21/14 0600  clindamycin (CLEOCIN) IVPB 900 mg     900 mg 100 mL/hr over 30 Minutes Intravenous On call to O.R. 04/20/14 1343 04/21/14 0800       Patient was given sequential compression  devices, early ambulation, and  ASA 325mg  BID to prevent DVT.  Patient benefited maximally from hospital stay and there were no complications.    Recent vital signs: Patient Vitals for the past 24 hrs:  BP Temp Temp src Pulse Resp SpO2  04/22/14 0513 (!) 112/58 mmHg 99.3 F (37.4 C) - 89 16 95 %  04/21/14 1957 114/62 mmHg 98.2 F (36.8 C) - 70 18 92 %  04/21/14 1400 109/67 mmHg 98.1 F (36.7 C) Oral 68 18 100 %  04/21/14 1215 (!) 96/57 mmHg 98 F (36.7 C) Oral 63 16 100 %  04/21/14 1145 106/60 mmHg - - 63 13 97 %  04/21/14 1130 - - - 62 14 97 %  04/21/14 1116 102/61 mmHg - - 63 14 98 %  04/21/14 1115 - - - 61 18 98 %  04/21/14 1103 (!) 50/37 mmHg - - 63 18 99 %  04/21/14 1100 - - - 62 17 99 %  04/21/14 1048 (!) 112/58 mmHg - - 65 17 100 %  04/21/14 1045 - - - 65 16 100 %  04/21/14 1033 119/63 mmHg - - 64 18 100 %  04/21/14 1030 - - - 66 16 100 %  04/21/14 1015 (!) 116/57 mmHg 98.1 F (36.7 C) - 72 14 100 %  Recent laboratory studies:  Recent Labs  04/22/14 0549  WBC 9.9  HGB 10.0*  HCT 30.8*  PLT 155  NA 131*  K 3.8  CL 97  CO2 23  BUN 7  CREATININE 0.75  GLUCOSE 188*  CALCIUM 8.1*     Discharge Medications:     Medication List    STOP taking these medications        HYDROcodone-acetaminophen 7.5-325 MG per tablet  Commonly known as:  NORCO      TAKE these medications        acetaminophen 500 MG tablet  Commonly known as:  TYLENOL  Take 500 mg by mouth every 6 (six) hours as needed.     ARIPiprazole 5 MG tablet  Commonly known as:  ABILIFY  Take 2.5 mg by mouth daily before breakfast.     aspirin 81 MG tablet  Take 81 mg by mouth daily.     atenolol 50 MG tablet  Commonly known as:  TENORMIN  TAKE 1 TABLET DAILY     atorvastatin 40 MG tablet  Commonly known as:  LIPITOR  Take 40 mg by mouth daily at 6 PM.     B-D ULTRAFINE III SHORT PEN 31G X 8 MM Misc  Generic drug:  Insulin Pen Needle  USE AS DIRECTED     Diclofenac Sodium 2 %  Soln  Apply twice daily.     docusate sodium 100 MG capsule  Commonly known as:  COLACE  Take 1 capsule (100 mg total) by mouth 3 (three) times daily as needed.     DULoxetine 60 MG capsule  Commonly known as:  CYMBALTA  Take 60 mg by mouth daily before breakfast.     gabapentin 100 MG capsule  Commonly known as:  NEURONTIN  Take 1 capsule (100 mg total) by mouth at bedtime.     glucose blood test strip  Commonly known as:  ONE TOUCH ULTRA TEST  Check twice a day     Insulin Detemir 100 UNIT/ML Pen  Commonly known as:  LEVEMIR FLEXTOUCH  INJECT 0.5 ML INTO SKIN DAILY AS DIRECTED     losartan 50 MG tablet  Commonly known as:  COZAAR  TAKE 1 TABLET (50 MG TOTAL) BY MOUTH DAILY.     metFORMIN 1000 MG tablet  Commonly known as:  GLUCOPHAGE  Take 1 tablet (1,000 mg total) by mouth 2 (two) times daily with a meal.     omeprazole 20 MG capsule  Commonly known as:  PRILOSEC  TAKE ONE CAPSULE BY MOUTH EVERY DAY     oxyCODONE-acetaminophen 5-325 MG per tablet  Commonly known as:  ROXICET  Take 1-2 tablets by mouth every 4 (four) hours as needed for severe pain.     traMADol 50 MG tablet  Commonly known as:  ULTRAM  Take 1 tablet (50 mg total) by mouth at bedtime as needed.        Diagnostic Studies: Dg Chest 2 View  04/12/2014   CLINICAL DATA:  Preoperative evaluation for shoulder replacement surgery, history diabetes, hypertension, GERD  EXAM: CHEST  2 VIEW  COMPARISON:  01/16/2012; correlation CT chest 07/15/2013  FINDINGS: Upper normal heart size.  Mediastinal contours and pulmonary vascularity normal.  Lungs appear mildly hyperinflated but clear.  No pleural effusion or pneumothorax.  6 mm superior segment RIGHT lower lobe nodule identified on prior CT is not radiographically apparent.  Bones demineralized.  IMPRESSION: No acute abnormalities.   Electronically Signed   By: Elta Gu[place]oupe  Thornton Papas M.D.   On: 04/12/2014 04:44   Ct Shoulder Right Wo Contrast  04/12/2014   CLINICAL DATA:   Preoperative examination. Patient for right shoulder replacement 04/21/2014. Chronic right shoulder pain and limited range of motion.  EXAM: CT OF THE RIGHT SHOULDER WITHOUT CONTRAST  TECHNIQUE: Multidetector CT imaging was performed according to the standard protocol. Multiplanar CT image reconstructions were also generated.  COMPARISON:  MRI right shoulder 11/19/2013.  FINDINGS: As on the patient's MRI, there is marked glenohumeral osteoarthritis with joint space narrowing and a small osteophyte off the humeral head. Subchondral sclerosis in the glenoid is identified. Small subchondral cysts are also seen. The largest is in the anterior glenoid in the superior pole and measures 0.5 cm AP by 0.5 cm transverse by up to 0.9 cm craniocaudal. The glenoid is slightly mid mildly remodeled. A small sclerotic lesion in the humeral head is consistent with a bone island. Also seen is a focus of flattening of subchondral bone in the superior, posterior aspect of the articular surface of the humeral head measuring 1.3 cm craniocaudal by 1.4 cm transverse. 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 Surrounding sclerosis is noted.  There is mild to moderate acromioclavicular degenerative change. A small subacromial spur is noted. The supraspinatus tendon tear seen on the prior MRI is not as well demonstrated today. Musculature about the shoulder appears intact without atrophy or focal lesion. Fluid is identified in the subscapularis recess Imaged lung parenchyma is clear.  IMPRESSION: Glenohumeral osteoarthritis with associated subchondral sclerosis and small subchondral cysts.  Flattening of subchondral bone in the posterior, superior aspect of the articular surface of the humeral head is most consistent with avascular necrosis.  Supraspinatus tendon tear seen on comparison MRI is not well demonstrated today.  Mild to moderate acromioclavicular osteoarthritis. Subacromial spurring is  also identified.  Fluid in the subcoracoid bursa with small loose bodies consistent with bursitis.   Electronically Signed   By: Inge Rise M.D.   On: 04/12/2014 11:44   Dg Shoulder Right Port  04/21/2014   CLINICAL DATA:  Status post right shoulder replacement  EXAM: PORTABLE RIGHT SHOULDER - 2+ VIEW  COMPARISON:  04/12/2014  FINDINGS: Postsurgical changes are now seen consistent with the shoulder joint replacement. No acute bony abnormality is noted.  IMPRESSION: Status post right shoulder arthroplasty.   Electronically Signed   By: Inez Catalina M.D.   On: 04/21/2014 10:47    Disposition: 01-Home or Self Care      Discharge Instructions    Call MD / Call 911    Complete by:  As directed   If you experience chest pain or shortness of breath, CALL 911 and be transported to the hospital emergency room.  If you develope a fever above 101 F, pus (white drainage) or increased drainage or redness at the wound, or calf pain, call your surgeon's office.     Constipation Prevention    Complete by:  As directed   Drink plenty of fluids.  Prune juice may be helpful.  You may use a stool softener, such as Colace (over the counter) 100 mg twice a day.  Use MiraLax (over the counter) for constipation as needed.     Diet - low sodium heart healthy    Complete by:  As directed      Increase activity slowly as tolerated    Complete by:  As directed  Follow-up Information    Follow up with Nita Sells, MD. Schedule an appointment as soon as possible for a visit in 2 weeks.   Specialty:  Orthopedic Surgery   Contact information:   [place]  [phone]        Signed: Grier Mitts 04/22/2014, 8:10 AM

## 2014-04-22 NOTE — Progress Notes (Signed)
Occupational Therapy Evaluation Patient Details Name: [name] MRN: [id number] DOB: [birth year] Today's Date: 04/22/2014    History of Present Illness s/p R reverse TSA   Clinical Impression   PTA, pt independent with mobility and ADL. Pt making slow progress this am. Eval limited by pain and lethargy, although began education on conservative shoulder protocol with pt/family. Pt states she did not sleep last night and is having difficulty controlling her pain. At this time, feel pt would benefit from staying tonight and D/C in the am after OT. Pt will have more support tomorrow after her sister arrives. At this time, pt only has her husband's support who is morbidly obese and has difficulty with mobility himself. PA and nurse notified. Will follow up in the am.    Follow Up Recommendations  No OT follow up;Supervision/Assistance - 24 hour    Equipment Recommendations  None recommended by OT    Recommendations for Other Services       Precautions / Restrictions Precautions Precautions: Shoulder Type of Shoulder Precautions: conservative - no shoulder ROM; elbow/wrist/hand ROM only Precaution Booklet Issued: Yes (comment) Precaution Comments: reviewed with family Required Braces or Orthoses: Sling Restrictions Weight Bearing Restrictions: Yes RUE Weight Bearing: Non weight bearing      Mobility Bed Mobility Overal bed mobility: Needs Assistance Bed Mobility: Supine to Sit     Supine to sit: Mod assist;HOB elevated     General bed mobility comments: increased time. cues to open eyes and continue  Transfers Overall transfer level: Needs assistance Equipment used: 1 person hand held assist Transfers: Sit to/from [facility] Sit to Stand: Min assist Stand pivot transfers: Min assist            Balance Overall balance assessment: Needs assistance Sitting-balance support: Feet supported Sitting balance-Leahy Scale: Good     Standing balance  support: During functional activity;Single extremity supported Standing balance-Leahy Scale: Poor                              ADL Overall ADL's : Needs assistance/impaired     Grooming: Set up;Standing   Upper Body Bathing: Moderate assistance;Sitting   Lower Body Bathing: Sit to/from stand;Moderate assistance   Upper Body Dressing : Maximal assistance;Sitting   Lower Body Dressing: Maximal assistance;Sit to/from stand   Toilet Transfer: Minimal assistance;Ambulation;Comfort height toilet   Toileting- Clothing Manipulation and Hygiene: Minimal assistance;Sit to/from stand       Functional mobility during ADLs: Minimal assistance General ADL Comments: Began education regarding compensatory techniques for bathing and dressing. Educated on donning/doffing sling and positioning of RUE iin sitting/supine to support RUE. rec for pt to sleep in recliner if possible until she is able to get out of bed easier. Only has husband to assist tonight who has difficulty with ambulation himself.      Vision     Perception     Praxis      Pertinent Vitals/Pain Pain Assessment: 0-10 Pain Score: 6  Pain Location: R shoulder Pain Descriptors / Indicators: Aching;Grimacing;Discomfort;Pressure Pain Intervention(s): Limited activity within patient's tolerance;Monitored during session;Premedicated before session;Repositioned;Ice applied     Hand Dominance Right   Extremity/Trunk Assessment Upper Extremity Assessment Upper Extremity Assessment: RUE deficits/detail RUE Deficits / Details: RUE edematous and very painful with elbow ROM. Elbow/wrist/hand ROM WFL RUE Coordination: decreased gross motor;decreased fine motor   Lower Extremity Assessment Lower Extremity Assessment: Overall WFL for tasks assessed   Cervical /  Trunk Assessment Cervical / Trunk Assessment: Normal   Communication Communication Communication: No difficulties   Cognition Arousal/Alertness:  Lethargic;Suspect due to medications Behavior During Therapy: [facility] for tasks assessed/performed Overall Cognitive Status: Within Functional Limits for tasks assessed                     General Comments       Exercises Exercises: Shoulder     Shoulder Instructions Shoulder Instructions Donning/doffing shirt without moving shoulder: Maximal assistance Method for sponge bathing under operated UE: Moderate assistance Donning/doffing sling/immobilizer: Moderate assistance Correct positioning of sling/immobilizer: Moderate assistance ROM for elbow, wrist and digits of operated UE: Minimal assistance Sling wearing schedule (on at all times/off for ADL's): Minimal assistance Proper positioning of operated UE when showering: Minimal assistance Positioning of UE while sleeping: Minimal assistance    Home Living Family/patient expects to be discharged to:: Private residence Living Arrangements: Spouse/significant other Available Help at Discharge: Available 24 hours/day Type of Home: House Home Access: Stairs to enter Technical brewer of Steps: 4 Entrance Stairs-Rails: Right Home Layout: One level     Bathroom Shower/Tub: Occupational psychologist: Handicapped height Bathroom Accessibility: Yes How Accessible: Accessible via walker Home Equipment: Shower seat          Prior Functioning/Environment Level of Independence: Independent             OT Diagnosis: Generalized weakness;Acute pain   OT Problem List: Decreased strength;Decreased range of motion;Decreased activity tolerance;Impaired balance (sitting and/or standing);Decreased knowledge of precautions;Obesity;Impaired UE functional use;Pain;Increased edema   OT Treatment/Interventions: Self-care/ADL training;Therapeutic exercise;DME and/or AE instruction;Therapeutic activities;Patient/family education    OT Goals(Current goals can be found in the care plan section) Acute Rehab OT Goals Patient  Stated Goal: to use omy arm OT Goal Formulation: With patient Time For Goal Achievement: 04/29/14 Potential to Achieve Goals: Good ADL Goals Pt Will Perform Upper Body Bathing: with caregiver independent in assisting;sitting;with set-up Pt Will Perform Lower Body Bathing: with set-up;with caregiver independent in assisting;sit to/from stand Pt Will Perform Upper Body Dressing: with set-up;with caregiver independent in assisting;sitting Pt Will Perform Lower Body Dressing: with set-up;with caregiver independent in assisting;sit to/from stand Pt Will Transfer to Toilet: with min guard assist;ambulating (with caregiver assisting) Pt/caregiver will Perform Home Exercise Program: Increased ROM;Right Upper extremity;With written HEP provided;Independently (elbow.wrist.hand A/AAROM only) Additional ADL Goal #1: pt/family will demonstrate understanding of shoulde protocol and precautions  OT Frequency: Min 3X/week   Barriers to D/C:    only husband able to help tonight who has mobility issues himself       Co-evaluation              End of Session Equipment Utilized During Treatment: Gait belt Nurse Communication: Mobility status;Precautions;Weight bearing status;Other (comment) (concerns regading D/C)  Activity Tolerance: No increased pain;Patient limited by lethargy Patient left: in chair;with call bell/phone within reach;with family/visitor present   Time: 0906-0950 OT Time Calculation (min): 44 min Charges:  OT General Charges $OT Visit: 1 Procedure OT Evaluation $Initial OT Evaluation Tier I: 1 Procedure OT Treatments $Self Care/Home Management : 23-37 mins G-Codes:    Kirat Mezquita,HILLARY 2014/05/05, 10:18 AM   Maurie Boettcher, OTR/L  [phone] May 05, 2014

## 2014-04-23 ENCOUNTER — Encounter: Payer: Self-pay | Admitting: Gastroenterology

## 2014-04-23 ENCOUNTER — Encounter (HOSPITAL_COMMUNITY): Payer: Self-pay | Admitting: Orthopedic Surgery

## 2014-04-23 LAB — GLUCOSE, CAPILLARY: Glucose-Capillary: 169 mg/dL — ABNORMAL HIGH (ref 70–99)

## 2014-04-23 NOTE — Progress Notes (Signed)
Nurse mistakenly wasted dilaudid under patient's name. Dilaudid was not taken under patient's name. Dilaudid was taken  under different patient. Charge nurse notified and Dilaudid was wasted under the right patient.

## 2014-04-23 NOTE — Progress Notes (Signed)
Subjective: 2 Days Post-Op Procedure(s) (LRB): REVERSE SHOULDER ARTHROPLASTY (Right)  Patients shoulder is doing much better this morning. She is ready to go home.   Objective: Vital signs in last 24 hours: Temp:  [98.2 F (36.8 C)-98.6 F (37 C)] 98.2 F (36.8 C) (04/16 0450) Pulse Rate:  [76-108] 106 (04/16 0450) Resp:  [18] 18 (04/16 0450) BP: (111-148)/(54-76) 112/59 mmHg (04/16 0450) SpO2:  [90 %-98 %] 90 % (04/16 0450)  Labs:  Recent Labs  04/22/14 0549  HGB 10.0*    Recent Labs  04/22/14 0549  WBC 9.9  RBC 3.80*  HCT 30.8*  PLT 155    Recent Labs  04/22/14 0549  NA 131*  K 3.8  CL 97  CO2 23  BUN 7  CREATININE 0.75  GLUCOSE 188*  CALCIUM 8.1*   No results for input(s): LABPT, INR in the last 72 hours.  Physical Exam:  Neurologically intact ABD soft Neurovascular intact Sensation intact distally Intact pulses distally Incision: dressing C/D/I No cellulitis present Compartment soft  Assessment/Plan:  2 Days Post-Op Procedure(s) (LRB): REVERSE SHOULDER ARTHROPLASTY (Right) OT today, hand wrist elbow only D/c today to home Percocet for home rx, in chart Fu Dr. Tamera Punt in 2 weeks   Nolita Kutter, Larwance Sachs 04/23/2014, 8:11 AM

## 2014-04-23 NOTE — Progress Notes (Signed)
Occupational Therapy Treatment and Discharge Patient Details Name: [name] MRN: [id number] DOB: 1946/01/05 Today's Date: 04/23/2014    History of present illness s/p R reverse TSA   OT comments  This 69 yo female s/p above presents to acute OT with all education completed with her and her husband. We will D/C from acute OT.  Follow Up Recommendations  No OT follow up;Supervision/Assistance - 24 hour    Equipment Recommendations  None recommended by OT       Precautions / Restrictions Precautions Precautions: Shoulder Type of Shoulder Precautions: conservative - no shoulder ROM; elbow/wrist/hand ROM only Precaution Booklet Issued: Yes (comment) Precaution Comments: reviewed with family Required Braces or Orthoses: Sling Restrictions Weight Bearing Restrictions: Yes RUE Weight Bearing: Non weight bearing       Mobility Bed Mobility Overal bed mobility: Needs Assistance Bed Mobility: Supine to Sit     Supine to sit: Min assist;HOB elevated        Transfers Overall transfer level: Needs assistance Equipment used: 1 person hand held assist Transfers: Sit to/from Stand Sit to Stand: Min assist         General transfer comment: Made husband aware that he needs to be with her anytime she is up on her feet    Balance Overall balance assessment: Needs assistance Sitting-balance support: Feet supported;Single extremity supported Sitting balance-Leahy Scale: Fair     Standing balance support: During functional activity;Single extremity supported Standing balance-Leahy Scale: Poor                     ADL Overall ADL's : Needs assistance/impaired                         Toilet Transfer: Minimal assistance;Ambulation;Comfort height toilet;Grab bars   Toileting- Clothing Manipulation and Hygiene: Minimal assistance;Sit to/from stand                          Cognition   Behavior During Therapy: [facility] for tasks  assessed/performed Overall Cognitive Status: Within Functional Limits for tasks assessed                         Exercises Other Exercises Other Exercises: elbow, wrist and hand exercises Donning/doffing shirt without moving shoulder: Caregiver independent with task Method for sponge bathing under operated UE: Caregiver independent with task Donning/doffing sling/immobilizer: Caregiver independent with task Correct positioning of sling/immobilizer: Caregiver independent with task Pendulum exercises (written home exercise program):  (NA) ROM for elbow, wrist and digits of operated UE: Supervision/safety Sling wearing schedule (on at all times/off for ADL's): Independent Dressing change:  (NA) Positioning of UE while sleeping: Caregiver independent with task   Shoulder Instructions Shoulder Instructions Donning/doffing shirt without moving shoulder: Caregiver independent with task Method for sponge bathing under operated UE: Caregiver independent with task Donning/doffing sling/immobilizer: Caregiver independent with task Correct positioning of sling/immobilizer: Caregiver independent with task Pendulum exercises (written home exercise program):  (NA) ROM for elbow, wrist and digits of operated UE: Supervision/safety Sling wearing schedule (on at all times/off for ADL's): Independent Dressing change:  (NA) Positioning of UE while sleeping: Caregiver independent with task          Pertinent Vitals/ Pain       Pain Assessment: 0-10 Pain Score: 7  Pain Location: right shoulder Pain Descriptors / Indicators: Aching;Sore;Discomfort Pain Intervention(s): Monitored during session;Patient requesting pain meds-RN notified;RN gave pain  meds during session         Frequency Min 3X/week     Progress Toward Goals  OT Goals(current goals can now be found in the care plan section)  Progress towards OT goals:  (all education completed)     Plan Discharge plan remains  appropriate       End of Session Equipment Utilized During Treatment:  (sling)   Activity Tolerance Patient tolerated treatment well   Patient Left in chair;with call bell/phone within reach;with family/visitor present   Nurse Communication  (Pt ready to go from therapy standpoint)        Time: 0076-2263 OT Time Calculation (min): 47 min  Charges: OT General Charges $OT Visit: 1 Procedure OT Treatments $Self Care/Home Management : 23-37 mins $Therapeutic Exercise: 8-22 mins  Almon Register [phone] 04/23/2014, 10:41 AM

## 2014-04-25 ENCOUNTER — Telehealth: Payer: Self-pay | Admitting: *Deleted

## 2014-04-25 NOTE — Telephone Encounter (Signed)
Patient admitted to hospital for reverse shoulder arthroplasty.  D/c instructions to follow-up with surgery, would you like for her to follow-up here as well?

## 2014-04-25 NOTE — Telephone Encounter (Signed)
No unless she has any problems related to my care.

## 2014-04-26 NOTE — Telephone Encounter (Signed)
FYI

## 2014-04-28 ENCOUNTER — Encounter: Payer: Self-pay | Admitting: Gastroenterology

## 2014-04-29 ENCOUNTER — Encounter: Payer: Self-pay | Admitting: Medical

## 2014-04-29 ENCOUNTER — Ambulatory Visit (INDEPENDENT_AMBULATORY_CARE_PROVIDER_SITE_OTHER): Payer: Medicare Other | Admitting: Medical

## 2014-04-29 VITALS — HR 75 | Temp 98.2°F | Ht 66.0 in | Wt 211.6 lb

## 2014-04-29 DIAGNOSIS — R82998 Other abnormal findings in urine: Secondary | ICD-10-CM

## 2014-04-29 DIAGNOSIS — N390 Urinary tract infection, site not specified: Secondary | ICD-10-CM

## 2014-04-29 LAB — POCT URINALYSIS DIPSTICK
Bilirubin, UA: NEGATIVE
Glucose, UA: NEGATIVE
Ketones, UA: NEGATIVE
Nitrite, UA: NEGATIVE
Protein, UA: NEGATIVE
Spec Grav, UA: <=1.005
Urobilinogen, UA: 0.2
pH, UA: 5

## 2014-04-29 MED ORDER — PHENAZOPYRIDINE HCL 100 MG PO TABS: 100.0000 mg | ORAL_TABLET | Freq: Three times a day (TID) | ORAL | Status: DC | PRN

## 2014-04-29 MED ORDER — CIPROFLOXACIN HCL 500 MG PO TABS: 500.0000 mg | ORAL_TABLET | Freq: Two times a day (BID) | ORAL | Status: DC

## 2014-04-29 NOTE — Progress Notes (Signed)
Pre visit review using our clinic review tool, if applicable. No additional management support is needed unless otherwise documented below in the visit note. 

## 2014-04-29 NOTE — Patient Instructions (Addendum)
UTI (urinary tract infection) Your appear to have a urinary tract infection by symptom description. I am prescribing  cipro antibiotic for the probable infection. Hydrate well. I am sending out a urine culture. During the interim if your signs and symptoms worsen rather than improving please notify us. We will notify your when the culture results are back.  Follow up in 7 days or as needed.

## 2014-04-29 NOTE — Assessment & Plan Note (Signed)
Your appear to have a urinary tract infection by symptom description. I am prescribing  cipro antibiotic for the probable infection. Hydrate well. I am sending out a urine culture. During the interim if your signs and symptoms worsen rather than improving please notify us. We will notify your when the culture results are back.  Follow up in 7 days or as needed.

## 2014-04-29 NOTE — Progress Notes (Signed)
Subjective:    Patient ID: [name], female    DOB: August 06, 1945, 69 y.o.   MRN: [id number]  HPI  Pt in today reporting urinary symptoms for 1 day. Pt on 14th surgery for her rt shoulder under general anesthesia.  Dysuria- some burning each time she went. Frequent urination- 5-6 times today. Hesitancy-mild Suprapubic pressure-No Fever-no chills-no Nausea-no Vomiting-no CVA pain-No History of UTI-1 year or so ago. But not chronic. Gross hematuria-no     Review of Systems  Constitutional: Negative for fever, chills and fatigue.  Cardiovascular: Negative for chest pain and palpitations.  Gastrointestinal: Negative for abdominal pain and abdominal distention.  Genitourinary: Positive for dysuria, urgency and frequency. Negative for flank pain, difficulty urinating and vaginal pain.  Musculoskeletal: Negative for back pain.     Past Medical History  Diagnosis Date  . Diabetes mellitus     dr Buddy Duty  . Depression     sees Dr.Cotle  . Hyperlipemia   . Hypertension   . IBS (irritable bowel syndrome)     and dyspepsia  . RLS (restless legs syndrome)     h/o- no longer using Requip  . Adenomatous colon polyp 03/1988  . Osteopenia     dexa 06/2007 and 10/11 Rx CA vitamin d  . Chest pain      Sept 2011: stress test neg  . Diverticulosis   . Fatty liver     Increased LFTs, saw GI 06/2011, likely from fatty liver   . Adenomatous colon polyp 1990  . Anemia     borderline  . Cataract     no surgery  . GERD (gastroesophageal reflux disease)   . History of hiatal hernia   . Arthritis     R shoulder - degenerative     History   Social History  . Marital Status: Married    Spouse Name: [name]  . Number of Children: 3  . Years of Education: N/A   Occupational History  . retired, was a Leisure centre manager)     Therapist, sports  .     Social History Main Topics  . Smoking status: Former Smoker -- 2.00 packs/day for 10 years    Types: Cigarettes    Quit date: 11/26/1975    . Smokeless tobacco: Never Used     Comment: used to smoke 2 ppd  . Alcohol Use: Yes     Comment: rarely  . Drug Use: No  . Sexual Activity: Not on file   Other Topics Concern  . Not on file   Social History Narrative   Lives w/  [name]   2 g-child, twins on the way    Past Surgical History  Procedure Laterality Date  . Uterine fibroid embolization      90s  . Tonsillectomy    . Polypectomy    . Colonoscopy    . Reverse shoulder arthroplasty Right 04/21/2014    Procedure: REVERSE SHOULDER ARTHROPLASTY;  Surgeon: Tania Ade, MD;  Location: [facility];  Service: Orthopedics;  Laterality: Right;  Right reverse total shoulder replacement    Family History  Problem Relation Age of Onset  . Lung cancer Mother     smoker  . Ovarian cancer Paternal Aunt     ?  Marland Kitchen Breast cancer Neg Hx   . Rectal cancer Neg Hx   . Stomach cancer Neg Hx   . Esophageal cancer Neg Hx   . Diabetes Other     aunts-uncles   . Colon cancer  Father     F dx in his 35s  . Heart attack Other     GM in her 46s  . Stroke Other     aunts-uncles     Allergies  Allergen Reactions  . Penicillins     REACTION: unspecified RASH  . Sulfa Antibiotics Nausea Only  . Trulicity [Dulaglutide] Nausea Only    Current Outpatient Prescriptions on File Prior to Visit  Medication Sig Dispense Refill  . acetaminophen (TYLENOL) 500 MG tablet Take 500 mg by mouth every 6 (six) hours as needed.    . ARIPiprazole (ABILIFY) 5 MG tablet Take 2.5 mg by mouth daily before breakfast.     . aspirin 81 MG tablet Take 81 mg by mouth daily.    Marland Kitchen atenolol (TENORMIN) 50 MG tablet TAKE 1 TABLET DAILY (Patient taking differently: TAKE 1 TABLET DAILY    in the a.m.) 30 tablet 5  . atorvastatin (LIPITOR) 40 MG tablet Take 40 mg by mouth daily at 6 PM.     . B-D ULTRAFINE III SHORT PEN 31G X 8 MM MISC USE AS DIRECTED 100 each 2  . docusate sodium (COLACE) 100 MG capsule Take 1 capsule (100 mg total) by mouth 3 (three) times daily  as needed. 20 capsule 0  . DULoxetine (CYMBALTA) 60 MG capsule Take 60 mg by mouth daily before breakfast.     . gabapentin (NEURONTIN) 100 MG capsule Take 1 capsule (100 mg total) by mouth at bedtime. 30 capsule 3  . glucose blood (ONE TOUCH ULTRA TEST) test strip Check twice a day 200 each 3  . Insulin Detemir (LEVEMIR FLEXTOUCH) 100 UNIT/ML Pen INJECT 0.5 ML INTO SKIN DAILY AS DIRECTED (Patient taking differently: Inject 80 Units into the skin daily at 10 pm. INJECT 0.5 ML INTO SKIN DAILY AS DIRECTED) 15 pen 3  . losartan (COZAAR) 50 MG tablet TAKE 1 TABLET (50 MG TOTAL) BY MOUTH DAILY. (Patient taking differently: TAKE 1 TABLET (50 MG TOTAL) BY MOUTH DAILY.  (after dinner)) 30 tablet 5  . metFORMIN (GLUCOPHAGE) 1000 MG tablet Take 1 tablet (1,000 mg total) by mouth 2 (two) times daily with a meal. 180 tablet 3  . omeprazole (PRILOSEC) 20 MG capsule TAKE ONE CAPSULE BY MOUTH EVERY DAY (Patient taking differently: TAKE ONE CAPSULE BY MOUTH EVERY DAY   in the evening) 30 capsule 5  . oxyCODONE-acetaminophen (ROXICET) 5-325 MG per tablet Take 1-2 tablets by mouth every 4 (four) hours as needed for severe pain. 60 tablet 0  . traMADol (ULTRAM) 50 MG tablet Take 1 tablet (50 mg total) by mouth at bedtime as needed. (Patient not taking: Reported on 04/29/2014) 30 tablet 0   No current facility-administered medications on file prior to visit.    Pulse 75  Temp(Src) 98.2 F (36.8 C) (Oral)  Ht 5\' 6"  (1.676 m)  Wt 211 lb 9.6 oz (95.981 kg)  BMI 34.17 kg/m2  SpO2 98%      Objective:   Physical Exam  General  Mental Status- Alert. Orientation- Orientation x 4.   Skin General:- Normal. Moisture- Dry. Temperature- Warm.   Heart Ausculation-RRR  Lungs Ausculation- Clear, even, unlabored bilaterlly.    Abdomen Palpation/Percussion: Palpation and Percussion of the abdomen reveal-  None Tender, No Rebound tenderness, No Rigidity(guarding), No Palpable abdominal masses and No jar  tenderness. Faint  suprapubic tenderness. Liver:-Normal. Spleen:- Normal. Other Characteristics- No Costovertebral angle tenderness- Left or Costovertebral angle tenderness- Right.  Auscultation: Auscultation of the abdomen reveals- Bowel  Sounds normal.      Assessment & Plan:

## 2014-05-01 LAB — URINE CULTURE
Colony Count: NO GROWTH
Organism ID, Bacteria: NO GROWTH

## 2014-05-04 ENCOUNTER — Encounter: Payer: Self-pay | Admitting: Gastroenterology

## 2014-05-04 ENCOUNTER — Telehealth: Payer: Self-pay | Admitting: Internal Medicine

## 2014-05-04 NOTE — Telephone Encounter (Signed)
Relation to pt: self  Call back number: [phone]   Reason for call:  Pt wanted to inform MD she was discharged from the hospital 04/23/14 for pain in right shoulder and pt followed up with orthopedic and all is well. Pt has a CPE for 06/21/2014

## 2014-05-04 NOTE — Telephone Encounter (Signed)
Thanks, no need for hospital f/u in our office

## 2014-05-04 NOTE — Telephone Encounter (Signed)
FYI

## 2014-05-05 ENCOUNTER — Other Ambulatory Visit: Payer: Self-pay

## 2014-05-06 ENCOUNTER — Ambulatory Visit (HOSPITAL_BASED_OUTPATIENT_CLINIC_OR_DEPARTMENT_OTHER): Admission: RE | Admit: 2014-05-06 | Payer: Medicare Other | Source: Ambulatory Visit | Admitting: Orthopedic Surgery

## 2014-05-06 ENCOUNTER — Encounter (HOSPITAL_BASED_OUTPATIENT_CLINIC_OR_DEPARTMENT_OTHER): Admission: RE | Payer: Self-pay | Source: Ambulatory Visit

## 2014-05-06 SURGERY — ARTHROSCOPY, SHOULDER, WITH ROTATOR CUFF REPAIR
Anesthesia: General | Laterality: Right

## 2014-05-11 ENCOUNTER — Encounter: Payer: Self-pay | Admitting: Gastroenterology

## 2014-05-23 ENCOUNTER — Ambulatory Visit (INDEPENDENT_AMBULATORY_CARE_PROVIDER_SITE_OTHER): Payer: Medicare Other | Admitting: Medical

## 2014-05-23 ENCOUNTER — Encounter: Payer: Self-pay | Admitting: Medical

## 2014-05-23 VITALS — BP 121/86 | HR 77 | Temp 98.6°F | Ht 66.0 in | Wt 200.0 lb

## 2014-05-23 DIAGNOSIS — H6503 Acute serous otitis media, bilateral: Secondary | ICD-10-CM | POA: Diagnosis not present

## 2014-05-23 MED ORDER — FLUTICASONE PROPIONATE 50 MCG/ACT NA SUSP: 2.0000 | Freq: Every day | NASAL | Status: DC

## 2014-05-23 MED ORDER — AZITHROMYCIN 250 MG PO TABS: ORAL_TABLET | ORAL | Status: DC

## 2014-05-23 MED ORDER — BENZONATATE 100 MG PO CAPS: 100.0000 mg | ORAL_CAPSULE | Freq: Three times a day (TID) | ORAL | Status: DC | PRN

## 2014-05-23 NOTE — Progress Notes (Signed)
Pre visit review using our clinic review tool, if applicable. No additional management support is needed unless otherwise documented below in the visit note. 

## 2014-05-23 NOTE — Assessment & Plan Note (Signed)
Rx azithromycin antibiotic. This OM preceded by nasal congestion. Rx flonase. Recent cough so rx benzonatate.   Note azithromycin should cover bronchitis as well since you note chest congestion.  If chest congestion does persist would recommend cxr.

## 2014-05-23 NOTE — Progress Notes (Signed)
   Subjective:    Patient ID: [name], female    DOB: 11/25/1945, 69 y.o.   MRN: [id number]  HPI   Pt has some nasal congestion and some left ear pain. Some chest congestion. This has been going on since Saturday. No sneezing or itching eyes. Pt has some some productive cough. Pt is not wheezing. Pt not smoker. Pt used to smoke but stopped more thatn 30 years ago.   Review of Systems  Constitutional: Negative for fever, chills, diaphoresis, activity change and fatigue.  HENT: Positive for congestion and ear pain.   Respiratory: Positive for cough. Negative for chest tightness and shortness of breath.        Productive cough.  Cardiovascular: Negative for chest pain, palpitations and leg swelling.  Gastrointestinal: Negative for nausea, vomiting and abdominal pain.  Musculoskeletal: Negative for neck pain and neck stiffness.  Neurological: Negative for dizziness, tremors, seizures, syncope, facial asymmetry, speech difficulty, weakness, light-headedness, numbness and headaches.  Psychiatric/Behavioral: Negative for behavioral problems, confusion and agitation. The patient is not nervous/anxious.        Objective:   Physical Exam   General  Mental Status - Alert. General Appearance - Well groomed. Not in acute distress.  Skin Rashes- No Rashes.  HEENT Head- Normal. Ear Auditory Canal - Left- Normal. Right - Normal.Tympanic Membrane- Left- mild . Right- moderate red. Eye Sclera/Conjunctiva- Left- Normal. Right- Normal. Nose & Sinuses Nasal Mucosa- Left-  Mild boggy or Congested. Right-  Mild boggy +  Congested. No sinus pressure. Mouth & Throat Lips: Upper Lip- Normal: no dryness, cracking, pallor, cyanosis, or vesicular eruption. Lower Lip-Normal: no dryness, cracking, pallor, cyanosis or vesicular eruption. Buccal Mucosa- Bilateral- No Aphthous ulcers. Oropharynx- No Discharge or Erythema. Tonsils: Characteristics- Bilateral- No Erythema or Congestion.  Size/Enlargement- Bilateral- No enlargement. Discharge- bilateral-None.  Neck Neck- Supple. No Masses.no lymphadenopathy.   Chest and Lung Exam Auscultation: Breath Sounds:- even and unlabored  Cardiovascular Auscultation:Rythm- Regular, rate and rhythm. Murmurs & Other Heart Sounds:Ausculatation of the heart reveal- No Murmurs.  Lymphatic Head & Neck General Head & Neck Lymphatics: Bilateral: Description- No Localized lymphadenopathy.      Assessment & Plan:  Note nasal congestion may have preceded om. Also with hx of smoking concern may have early bronchitis. See AVS for plan.

## 2014-05-23 NOTE — Patient Instructions (Signed)
Otitis media Rx azithromycin antibiotic. This OM preceded by nasal congestion. Rx flonase. Recent cough so rx benzonatate.   Note azithromycin should cover bronchitis as well since you note chest congestion.  If chest congestion does persist would recommend cxr.    Follow up 7 days or as needed.

## 2014-05-26 ENCOUNTER — Other Ambulatory Visit: Payer: Self-pay

## 2014-06-07 ENCOUNTER — Encounter: Payer: Self-pay | Admitting: Gastroenterology

## 2014-06-13 LAB — HEMOGLOBIN A1C: Hgb A1c MFr Bld: 7.3 % — AB (ref 4.0–6.0)

## 2014-06-13 LAB — HM DIABETES FOOT EXAM: HM Diabetic Foot Exam: ABNORMAL

## 2014-06-21 ENCOUNTER — Encounter: Payer: Self-pay | Admitting: Internal Medicine

## 2014-06-27 ENCOUNTER — Ambulatory Visit: Payer: Medicare Other | Admitting: Internal Medicine

## 2014-07-08 LAB — HM MAMMOGRAPHY: HM Mammogram: NORMAL

## 2014-07-24 ENCOUNTER — Telehealth: Payer: Self-pay | Admitting: Internal Medicine

## 2014-07-24 DIAGNOSIS — R911 Solitary pulmonary nodule: Secondary | ICD-10-CM

## 2014-07-24 NOTE — Telephone Encounter (Signed)
Advise patient, she is due for a CT chest, follow-up a nodule. Order is already in

## 2014-07-25 NOTE — Telephone Encounter (Signed)
thx

## 2014-07-25 NOTE — Telephone Encounter (Signed)
Pt has CT scheduled for 07/26/2014.

## 2014-07-26 ENCOUNTER — Ambulatory Visit (HOSPITAL_BASED_OUTPATIENT_CLINIC_OR_DEPARTMENT_OTHER)
Admission: RE | Admit: 2014-07-26 | Discharge: 2014-07-26 | Disposition: A | Discharge status: Home / Self Care | Payer: Medicare Other | Source: Ambulatory Visit | Attending: Internal Medicine | Admitting: Internal Medicine

## 2014-07-26 DIAGNOSIS — R911 Solitary pulmonary nodule: Secondary | ICD-10-CM

## 2014-07-26 DIAGNOSIS — Z87891 Personal history of nicotine dependence: Secondary | ICD-10-CM | POA: Diagnosis not present

## 2014-08-01 ENCOUNTER — Encounter: Payer: Self-pay | Admitting: Internal Medicine

## 2014-08-08 DIAGNOSIS — R413 Other amnesia: Secondary | ICD-10-CM

## 2014-08-08 HISTORY — DX: Other amnesia: R41.3

## 2014-08-26 LAB — HM DIABETES FOOT EXAM: HM Diabetic Foot Exam: ABNORMAL

## 2014-08-30 ENCOUNTER — Other Ambulatory Visit: Payer: Self-pay

## 2014-08-31 ENCOUNTER — Other Ambulatory Visit: Payer: Self-pay

## 2014-09-19 ENCOUNTER — Emergency Department (HOSPITAL_COMMUNITY)
Admission: EM | Admit: 2014-09-19 | Discharge: 2014-09-19 | Disposition: A | Discharge status: Home / Self Care | Payer: Medicare Other | Attending: Emergency Medicine | Admitting: Emergency Medicine

## 2014-09-19 ENCOUNTER — Telehealth: Payer: Self-pay | Admitting: Internal Medicine

## 2014-09-19 ENCOUNTER — Encounter (HOSPITAL_COMMUNITY): Payer: Self-pay | Admitting: Emergency Medicine

## 2014-09-19 DIAGNOSIS — E119 Type 2 diabetes mellitus without complications: Secondary | ICD-10-CM | POA: Insufficient documentation

## 2014-09-19 DIAGNOSIS — I10 Essential (primary) hypertension: Secondary | ICD-10-CM | POA: Diagnosis not present

## 2014-09-19 DIAGNOSIS — Z7951 Long term (current) use of inhaled steroids: Secondary | ICD-10-CM | POA: Diagnosis not present

## 2014-09-19 DIAGNOSIS — M1990 Unspecified osteoarthritis, unspecified site: Secondary | ICD-10-CM | POA: Diagnosis not present

## 2014-09-19 DIAGNOSIS — Z79899 Other long term (current) drug therapy: Secondary | ICD-10-CM | POA: Diagnosis not present

## 2014-09-19 DIAGNOSIS — Z880 Allergy status to penicillin: Secondary | ICD-10-CM | POA: Diagnosis not present

## 2014-09-19 DIAGNOSIS — E785 Hyperlipidemia, unspecified: Secondary | ICD-10-CM | POA: Insufficient documentation

## 2014-09-19 DIAGNOSIS — R55 Syncope and collapse: Secondary | ICD-10-CM | POA: Diagnosis not present

## 2014-09-19 DIAGNOSIS — G2581 Restless legs syndrome: Secondary | ICD-10-CM | POA: Insufficient documentation

## 2014-09-19 DIAGNOSIS — Z794 Long term (current) use of insulin: Secondary | ICD-10-CM | POA: Diagnosis not present

## 2014-09-19 DIAGNOSIS — I959 Hypotension, unspecified: Secondary | ICD-10-CM | POA: Diagnosis present

## 2014-09-19 DIAGNOSIS — Z87891 Personal history of nicotine dependence: Secondary | ICD-10-CM | POA: Insufficient documentation

## 2014-09-19 DIAGNOSIS — F329 Major depressive disorder, single episode, unspecified: Secondary | ICD-10-CM | POA: Diagnosis not present

## 2014-09-19 DIAGNOSIS — K219 Gastro-esophageal reflux disease without esophagitis: Secondary | ICD-10-CM | POA: Diagnosis not present

## 2014-09-19 DIAGNOSIS — Z86018 Personal history of other benign neoplasm: Secondary | ICD-10-CM | POA: Diagnosis not present

## 2014-09-19 DIAGNOSIS — Z7982 Long term (current) use of aspirin: Secondary | ICD-10-CM | POA: Insufficient documentation

## 2014-09-19 LAB — CBC WITH DIFFERENTIAL/PLATELET
Basophils Absolute: 0 10*3/uL (ref 0.0–0.1)
Basophils Relative: 0 % (ref 0–1)
Eosinophils Absolute: 0.2 10*3/uL (ref 0.0–0.7)
Eosinophils Relative: 3 % (ref 0–5)
HCT: 33.3 % — ABNORMAL LOW (ref 36.0–46.0)
Hemoglobin: 10.8 g/dL — ABNORMAL LOW (ref 12.0–15.0)
Lymphocytes Relative: 32 % (ref 12–46)
Lymphs Abs: 2.3 10*3/uL (ref 0.7–4.0)
MCH: 25.2 pg — ABNORMAL LOW (ref 26.0–34.0)
MCHC: 32.4 g/dL (ref 30.0–36.0)
MCV: 77.6 fL — ABNORMAL LOW (ref 78.0–100.0)
Monocytes Absolute: 0.3 10*3/uL (ref 0.1–1.0)
Monocytes Relative: 4 % (ref 3–12)
Neutro Abs: 4.3 10*3/uL (ref 1.7–7.7)
Neutrophils Relative %: 61 % (ref 43–77)
Platelets: 171 10*3/uL (ref 150–400)
RBC: 4.29 MIL/uL (ref 3.87–5.11)
RDW: 14.7 % (ref 11.5–15.5)
WBC: 7 10*3/uL (ref 4.0–10.5)

## 2014-09-19 LAB — URINALYSIS, ROUTINE W REFLEX MICROSCOPIC
Bilirubin Urine: NEGATIVE
Glucose, UA: NEGATIVE mg/dL
Hgb urine dipstick: NEGATIVE
Ketones, ur: NEGATIVE mg/dL
Leukocytes, UA: NEGATIVE
Nitrite: NEGATIVE
Protein, ur: NEGATIVE mg/dL
Specific Gravity, Urine: 1.006 (ref 1.005–1.030)
Urobilinogen, UA: 0.2 mg/dL (ref 0.0–1.0)
pH: 5.5 (ref 5.0–8.0)

## 2014-09-19 LAB — COMPREHENSIVE METABOLIC PANEL
ALT: 36 U/L (ref 14–54)
AST: 41 U/L (ref 15–41)
Albumin: 3.5 g/dL (ref 3.5–5.0)
Alkaline Phosphatase: 63 U/L (ref 38–126)
Anion gap: 10 (ref 5–15)
BUN: 6 mg/dL (ref 6–20)
CO2: 24 mmol/L (ref 22–32)
Calcium: 8.9 mg/dL (ref 8.9–10.3)
Chloride: 103 mmol/L (ref 101–111)
Creatinine, Ser: 0.65 mg/dL (ref 0.44–1.00)
GFR calc Af Amer: >60 mL/min (ref >60)
GFR calc non Af Amer: >60 mL/min (ref >60)
Glucose, Bld: 159 mg/dL — ABNORMAL HIGH (ref 65–99)
Potassium: 3.9 mmol/L (ref 3.5–5.1)
Sodium: 137 mmol/L (ref 135–145)
Total Bilirubin: 0.5 mg/dL (ref 0.3–1.2)
Total Protein: 6.4 g/dL — ABNORMAL LOW (ref 6.5–8.1)

## 2014-09-19 LAB — BASIC METABOLIC PANEL: Glucose: 234 mg/dL

## 2014-09-19 MED ORDER — SODIUM CHLORIDE 0.9 % IV BOLUS (SEPSIS)
1000.0000 mL | Freq: Once | INTRAVENOUS | Status: AC
  Administered 2014-09-19: 1000 mL via INTRAVENOUS

## 2014-09-19 NOTE — ED Notes (Signed)
Walked patient to the bathroom patient did well pat was not dizzy anymore oxygen at 100 room air heart rate great at 67 to 80's

## 2014-09-19 NOTE — Telephone Encounter (Signed)
Received a phone call from Dr. Buddy Duty endocrinology, patient is at his office, she is feeling quite lightheaded and can barely walk. She was orthostatic , EKG seems to be at baseline. We agreed that she will need to be seen at the ER for further eval. I can see her here at the office on follow-up whenever the ER feels is necessary. Please call the patient tomorrow 09/20/2014 and check on her.

## 2014-09-19 NOTE — ED Notes (Signed)
To ED via [facility] with c/o dizziness and low blood pressure episode today at the doctor's office-- was at her diabetes doctor this morning. Pt states "I may have accidentally taken 2 losarten this morning, I haven't ever done that, but I am not sure. "

## 2014-09-19 NOTE — ED Provider Notes (Signed)
Medical screening examination/treatment/procedure(s) were conducted as a shared visit with non-physician practitioner(s) and myself.  I personally evaluated the patient during the encounter.   EKG Interpretation   Date/Time:  Monday September 19 2014 14:06:26 EDT Ventricular Rate:  79 PR Interval:  164 QRS Duration: 89 QT Interval:  443 QTC Calculation: 508 R Axis:   -56 Text Interpretation:  Sinus rhythm Left anterior fascicular block  Prolonged QT interval no acute sichemia No significant change since last  tracing Confirmed by Gerald Leitz ([id number]) on 09/19/2014 3:24:39 PM      Patient seen by me. Patient with a hypotensive episode at her diabetic doctor's office this morning where systolic blood pressure dropped to 85 and did not recover immediately. However upon arrival here patient's blood pressure was over 993 systolic. She is normally around 716 and 967 systolic. Patient completely asymptomatic. Patient did not have a syncopal episode. Patient denies chest pain shortness of breath or diaphoresis or nausea or vomiting that occurred at the doctor's office. Patient states she may have accidentally taken one of her blood pressure medicines in excess thinks that it could've been the atenolol from last night. This certainly could've had an effective would've been worn off by now. Patient's EKG from the doctor's office without any acute findings. EKG here with no significant change. Will go and check basic labs and continue to monitor patient. We did do orthostatic blood pressures and she did fine. Basic labs are normal and patient can now be discharged home.  On physical exam patient's alert and oriented 3. Skin color is good. Heart regular rate and rhythm without murmurs lungs are clear bilaterally. Abdomen soft nontender with normal bowel sounds. Neurologically intact.  Fredia Sorrow, MD 09/19/14 1555

## 2014-09-19 NOTE — ED Provider Notes (Signed)
CSN: [id number]     Arrival date & time 09/19/14  1358 History   First MD Initiated Contact with Patient 09/19/14 1509     Chief Complaint  Patient presents with  . Hypotension     (Consider location/radiation/quality/duration/timing/severity/associated sxs/prior Treatment) HPI Patient presents to the emergency department with dizziness and low blood pressure at her doctor's office.  The patient states that she was seen by her primary care doctor and she got dizzy and lightheaded.  She was sent here to the emergency department for further evaluation.  Patient states she may have taken an extra atenolol this morning.  She is unsure.  Patient states that she does not have any chest pain, nausea, vomiting, weakness, abdominal pain, back pain, neck pain, fever, incontinence, dysuria, or syncope.  The patient states that nothing seems make her condition at or worse.  Patient states this time she is feeling no symptoms Past Medical History  Diagnosis Date  . Diabetes mellitus     dr Buddy Duty  . Depression     sees Dr.Cotle  . Hyperlipemia   . Hypertension   . IBS (irritable bowel syndrome)     and dyspepsia  . RLS (restless legs syndrome)     h/o- no longer using Requip  . Adenomatous colon polyp 03/1988  . Osteopenia     dexa 06/2007 and 10/11 Rx CA vitamin d  . Chest pain      Sept 2011: stress test neg  . Diverticulosis   . Fatty liver     Increased LFTs, saw GI 06/2011, likely from fatty liver   . Adenomatous colon polyp 1990  . Anemia     borderline  . Cataract     no surgery  . GERD (gastroesophageal reflux disease)   . History of hiatal hernia   . Arthritis     R shoulder - degenerative   . Memory impairment 08/2014    mild cognitive impairment vs. mild dementia, reommended reinstating of cholinesterase inhibitor    Past Surgical History  Procedure Laterality Date  . Uterine fibroid embolization      90s  . Tonsillectomy    . Polypectomy    . Colonoscopy    . Reverse  shoulder arthroplasty Right 04/21/2014    Procedure: REVERSE SHOULDER ARTHROPLASTY;  Surgeon: Tania Ade, MD;  Location: [facility];  Service: Orthopedics;  Laterality: Right;  Right reverse total shoulder replacement   Family History  Problem Relation Age of Onset  . Lung cancer Mother     smoker  . Ovarian cancer Paternal Aunt     ?  Marland Kitchen Breast cancer Neg Hx   . Rectal cancer Neg Hx   . Stomach cancer Neg Hx   . Esophageal cancer Neg Hx   . Diabetes Other     aunts-uncles   . Colon cancer Father     F dx in his 71s  . Heart attack Other     GM in her 51s  . Stroke Other     aunts-uncles    Social History  Substance Use Topics  . Smoking status: Former Smoker -- 2.00 packs/day for 10 years    Types: Cigarettes    Quit date: 11/26/1975  . Smokeless tobacco: Never Used     Comment: used to smoke 2 ppd  . Alcohol Use: Yes     Comment: rarely   OB History    No data available     Review of Systems   All other systems  negative except as documented in the HPI. All pertinent positives and negatives as reviewed in the HPI.  Allergies  Penicillins; Sulfa antibiotics; and Trulicity  Home Medications   Prior to Admission medications   Medication Sig Start Date End Date Taking? Authorizing Provider  acetaminophen (TYLENOL) 500 MG tablet Take 500 mg by mouth every 6 (six) hours as needed.    Historical Provider, MD  ARIPiprazole (ABILIFY) 5 MG tablet Take 2.5 mg by mouth daily before breakfast.     Historical Provider, MD  aspirin 81 MG tablet Take 81 mg by mouth daily.    Historical Provider, MD  atenolol (TENORMIN) 50 MG tablet TAKE 1 TABLET DAILY Patient taking differently: TAKE 1 TABLET DAILY    in the a.m. 03/22/14   Colon Branch, MD  atorvastatin (LIPITOR) 40 MG tablet Take 40 mg by mouth daily at 6 PM.     Historical Provider, MD  B-D ULTRAFINE III SHORT PEN 31G X 8 MM MISC USE AS DIRECTED 08/23/13   Philemon Kingdom, MD  docusate sodium (COLACE) 100 MG capsule Take 1  capsule (100 mg total) by mouth 3 (three) times daily as needed. 04/22/14   Grier Mitts, PA-C  donepezil (ARICEPT) 5 MG tablet Take 1 tablet (5 mg total) by mouth at bedtime. 08/30/14   Colon Branch, MD  DULoxetine (CYMBALTA) 60 MG capsule Take 60 mg by mouth daily before breakfast.     Historical Provider, MD  fluticasone (FLONASE) 50 MCG/ACT nasal spray Place 2 sprays into both nostrils daily. 05/23/14   Percell Miller Saguier, PA-C  gabapentin (NEURONTIN) 100 MG capsule Take 1 capsule (100 mg total) by mouth at bedtime. 11/10/13   Lyndal Pulley, DO  glucose blood (ONE TOUCH ULTRA TEST) test strip Check twice a day 07/27/13   Philemon Kingdom, MD  Insulin Detemir (LEVEMIR FLEXTOUCH) 100 UNIT/ML Pen Inject 90 Units into the skin daily. 08/31/14   Delrae Rend, MD  losartan (COZAAR) 50 MG tablet TAKE 1 TABLET (50 MG TOTAL) BY MOUTH DAILY. Patient taking differently: TAKE 1 TABLET (50 MG TOTAL) BY MOUTH DAILY.  (after dinner) 03/14/14   Colon Branch, MD  metFORMIN (GLUCOPHAGE) 1000 MG tablet Take 1 tablet (1,000 mg total) by mouth 2 (two) times daily with a meal. 07/27/13   Philemon Kingdom, MD  omeprazole (PRILOSEC) 20 MG capsule TAKE ONE CAPSULE BY MOUTH EVERY DAY Patient taking differently: TAKE ONE CAPSULE BY MOUTH EVERY DAY   in the evening 03/14/14   Colon Branch, MD   BP 108/44 mmHg  Pulse 63  Temp(Src) 98 F (36.7 C) (Oral)  Resp 18  SpO2 95% Physical Exam  Constitutional: She is oriented to person, place, and time. She appears well-developed and well-nourished. No distress.  HENT:  Head: Normocephalic and atraumatic.  Mouth/Throat: Oropharynx is clear and moist.  Eyes: Pupils are equal, round, and reactive to light.  Neck: Normal range of motion. Neck supple.  Cardiovascular: Normal rate, regular rhythm and normal heart sounds.  Exam reveals no gallop and no friction rub.   No murmur heard. Pulmonary/Chest: Effort normal and breath sounds normal. No respiratory distress.  Musculoskeletal: She  exhibits no edema.  Neurological: She is alert and oriented to person, place, and time. She exhibits normal muscle tone. Coordination normal.  Skin: Skin is warm and dry. No rash noted. No erythema.  Psychiatric: She has a normal mood and affect. Her behavior is normal.  Nursing note and vitals reviewed.   ED Course  Procedures (including critical care time) Labs Review Labs Reviewed  COMPREHENSIVE METABOLIC PANEL - Abnormal; Notable for the following:    Glucose, Bld 159 (*)    Total Protein 6.4 (*)    All other components within normal limits  CBC WITH DIFFERENTIAL/PLATELET - Abnormal; Notable for the following:    Hemoglobin 10.8 (*)    HCT 33.3 (*)    MCV 77.6 (*)    MCH 25.2 (*)    All other components within normal limits  URINALYSIS, ROUTINE W REFLEX MICROSCOPIC (NOT AT [facility])    Imaging Review No results found. I have personally reviewed and evaluated these images and lab results as part of my medical decision-making.   EKG Interpretation   Date/Time:  Monday September 19 2014 14:06:26 EDT Ventricular Rate:  79 PR Interval:  164 QRS Duration: 89 QT Interval:  443 QTC Calculation: 508 R Axis:   -56 Text Interpretation:  Sinus rhythm Left anterior fascicular block  Prolonged QT interval no acute sichemia No significant change since last  tracing Confirmed by Gerald Leitz ([id number]) on 09/19/2014 3:24:39 PM        Last here in the emergency department.  She will be advised follow-up with her primary care doctor.  She ambulates without difficulty.  Patient is advised return here as needed  Dalia Heading, PA-C 09/24/14 [place], PA-C 09/24/14 2332  Fredia Sorrow, MD 10/07/14 [phone]

## 2014-09-19 NOTE — ED Notes (Signed)
Pt left with all belongings and ambulated out of treatment area with family.  

## 2014-09-19 NOTE — Discharge Instructions (Signed)
Return here as needed.  Follow-up with your primary care doctor, increase your fluid intake °

## 2014-09-20 NOTE — Telephone Encounter (Signed)
Spoke with Pt, she informed me that her BPs are back up around 120s/70s and is feeling much better. She is no longer having lightheadedness or dizziness. Informed her that we normally recommend a ED F/U with PCPs after ED visits, but since she is feeling better that she can make the determination of whether or not to have ED F/U visit. Pt verbalized understanding and declined to schedule an appt at this time. I instructed her to call if she begins experiencing low BP symptoms again and if her BPs become consistently lower. Pt again verbalized understanding.

## 2014-09-20 NOTE — Telephone Encounter (Signed)
ER visit reviewed: Hemoglobin is lower than baseline, she is due for a routine checkup, she has a office visit is scheduled for next month, we will recheck her labs at that time.

## 2014-09-21 ENCOUNTER — Ambulatory Visit (INDEPENDENT_AMBULATORY_CARE_PROVIDER_SITE_OTHER): Payer: Medicare Other | Admitting: Internal Medicine

## 2014-09-21 ENCOUNTER — Encounter: Payer: Self-pay | Admitting: Internal Medicine

## 2014-09-21 VITALS — BP 122/78 | HR 69 | Temp 97.8°F | Ht 66.0 in | Wt 207.4 lb

## 2014-09-21 DIAGNOSIS — Z23 Encounter for immunization: Secondary | ICD-10-CM

## 2014-09-21 DIAGNOSIS — E119 Type 2 diabetes mellitus without complications: Secondary | ICD-10-CM | POA: Diagnosis not present

## 2014-09-21 DIAGNOSIS — D649 Anemia, unspecified: Secondary | ICD-10-CM

## 2014-09-21 DIAGNOSIS — I10 Essential (primary) hypertension: Secondary | ICD-10-CM

## 2014-09-21 DIAGNOSIS — Z09 Encounter for follow-up examination after completed treatment for conditions other than malignant neoplasm: Secondary | ICD-10-CM | POA: Insufficient documentation

## 2014-09-21 LAB — FERRITIN: Ferritin: 10.6 ng/mL (ref 10.0–291.0)

## 2014-09-21 LAB — HEMOGLOBIN: Hemoglobin: 11.6 g/dL — ABNORMAL LOW (ref 12.0–15.0)

## 2014-09-21 LAB — IRON: Iron: 45 ug/dL (ref 42–145)

## 2014-09-21 NOTE — Progress Notes (Signed)
Pre visit review using our clinic review tool, if applicable. No additional management support is needed unless otherwise documented below in the visit note. 

## 2014-09-21 NOTE — Progress Notes (Signed)
Subjective:    Patient ID: [name], female    DOB: 08-11-1945, 69 y.o.   MRN: [id number]  DOS:  09/21/2014 Type of visit - description : ER follow-up Interval history: 2 days ago, the patient was at her endocrinologist office, was noted to be hypotensive w/ a SBP ~85, she was asymptomatic, EKG was nonacute. Was sent to the emergency room, upon arrival SBP was 100. BMP, LFTs were normal. UA negative Hemoglobin 10.8, 5 months ago was 10.0 Since she left the ER, she is holding the atenolol, no recent ambulatory BPs, she remains asymptomatic.    Review of Systems Denies chest pain or difficulty breathing No nausea, vomiting, diarrhea or blood in the stools. Occasionally is constipated. Also, sometimes her stools look "narrow".  Past Medical History  Diagnosis Date  . Diabetes mellitus     dr Buddy Duty  . Depression     sees Dr.Cotle  . Hyperlipemia   . Hypertension   . IBS (irritable bowel syndrome)     and dyspepsia  . RLS (restless legs syndrome)     h/o- no longer using Requip  . Adenomatous colon polyp 03/1988  . Osteopenia     dexa 06/2007 and 10/11 Rx CA vitamin d  . Chest pain      Sept 2011: stress test neg  . Diverticulosis   . Fatty liver     Increased LFTs, saw GI 06/2011, likely from fatty liver   . Adenomatous colon polyp 1990  . Anemia     borderline  . Cataract     no surgery  . GERD (gastroesophageal reflux disease)   . History of hiatal hernia   . Arthritis     R shoulder - degenerative   . Memory impairment 08/2014    mild cognitive impairment vs. mild dementia, reommended reinstating of cholinesterase inhibitor     Past Surgical History  Procedure Laterality Date  . Uterine fibroid embolization      90s  . Tonsillectomy    . Polypectomy    . Colonoscopy    . Reverse shoulder arthroplasty Right 04/21/2014    Procedure: REVERSE SHOULDER ARTHROPLASTY;  Surgeon: Tania Ade, MD;  Location: [facility];  Service: Orthopedics;  Laterality: Right;   Right reverse total shoulder replacement    Social History   Social History  . Marital Status: Married    Spouse Name: [name]  . Number of Children: 3  . Years of Education: N/A   Occupational History  . retired, was a Leisure centre manager)     Therapist, sports  .     Social History Main Topics  . Smoking status: Former Smoker -- 2.00 packs/day for 10 years    Types: Cigarettes    Quit date: 11/26/1975  . Smokeless tobacco: Never Used     Comment: used to smoke 2 ppd  . Alcohol Use: Yes     Comment: rarely  . Drug Use: No  . Sexual Activity: Not on file   Other Topics Concern  . Not on file   Social History Narrative   Lives w/  [name]   2 g-child, twins on the way        Medication List       This list is accurate as of: 09/21/14  6:54 PM.  Always use your most recent med list.               acetaminophen 500 MG tablet  Commonly known as:  TYLENOL  Take  500 mg by mouth every 6 (six) hours as needed.     ARICEPT 5 MG tablet  Generic drug:  donepezil  Take 1 tablet (5 mg total) by mouth at bedtime.     ARIPiprazole 5 MG tablet  Commonly known as:  ABILIFY  Take 2.5 mg by mouth daily before breakfast.     aspirin 81 MG tablet  Take 81 mg by mouth daily.     atenolol 50 MG tablet  Commonly known as:  TENORMIN  Take 25 mg by mouth daily.     atorvastatin 40 MG tablet  Commonly known as:  LIPITOR  Take 40 mg by mouth daily at 6 PM.     B-D ULTRAFINE III SHORT PEN 31G X 8 MM Misc  Generic drug:  Insulin Pen Needle  USE AS DIRECTED     docusate sodium 100 MG capsule  Commonly known as:  COLACE  Take 1 capsule (100 mg total) by mouth 3 (three) times daily as needed.     DULoxetine 60 MG capsule  Commonly known as:  CYMBALTA  Take 60 mg by mouth daily before breakfast.     fluticasone 50 MCG/ACT nasal spray  Commonly known as:  FLONASE  Place 2 sprays into both nostrils daily.     glucose blood test strip  Commonly known as:  ONE TOUCH ULTRA TEST  Check  twice a day     LEVEMIR FLEXTOUCH 100 UNIT/ML Pen  Generic drug:  Insulin Detemir  Inject 90 Units into the skin daily.     losartan 50 MG tablet  Commonly known as:  COZAAR  TAKE 1 TABLET (50 MG TOTAL) BY MOUTH DAILY.     metFORMIN 1000 MG tablet  Commonly known as:  GLUCOPHAGE  Take 1 tablet (1,000 mg total) by mouth 2 (two) times daily with a meal.     omeprazole 20 MG capsule  Commonly known as:  PRILOSEC  TAKE ONE CAPSULE BY MOUTH EVERY DAY           Objective:   Physical Exam BP 122/78 mmHg  Pulse 69  Temp(Src) 97.8 F (36.6 C) (Oral)  Ht 5\' 6"  (1.676 m)  Wt 207 lb 6 oz (94.065 kg)  BMI 33.49 kg/m2  SpO2 96% General:   Well developed, well nourished . NAD.  HEENT:  Normocephalic . Face symmetric, atraumatic Lungs:  CTA B Normal respiratory effort, no intercostal retractions, no accessory muscle use. Heart: RRR,  no murmur.  no pretibial edema bilaterally  Abdomen:  Not distended, soft, non-tender. No rebound or rigidity. No mass,organomegaly Skin: Not pale. Not jaundice Neurologic:  alert & oriented X3.  Speech normal, gait appropriate for age and unassisted Psych--  Cognition and judgment appear intact.  Cooperative with normal attention span and concentration.  Behavior appropriate. No anxious or depressed appearing.    Assessment & Plan:   A/P Hypertension: Episode of low BP, status post ER eval, workup negative, got IV fluids, BP now within normal. Recommend to go back on atenolol, only half dose, monitor her BP is and let me know if BP is low again. Diabetes: Per endocrinology Anemia: Hemoglobin has been low since shoulder surgery 04-2014, she has not bounced back. GI review systems is negative except for occasional "narrow"  stools. She has a family history of colon cancer and she is due for a colonoscopy Plan: Hemoglobin, iron, ferritin, GI referral Primary care: High dose shot today Follow-up December for a physical

## 2014-09-21 NOTE — Assessment & Plan Note (Signed)
Hypertension: Episode of low BP, status post ER eval, workup negative, got IV fluids, BP now within normal. Recommend to go back on atenolol, only half dose, monitor her BP is and let me know if BP is low again. Diabetes: Per endocrinology Anemia: Hemoglobin has been low since shoulder surgery 04-2014, she has not bounced back. GI review systems is negative except for occasional "narrow"  stools. She has a family history of colon cancer and she is due for a colonoscopy Plan: Hemoglobin, iron, ferritin, GI referral Primary care: High dose shot today

## 2014-09-21 NOTE — Patient Instructions (Signed)
Get your blood work before you leave   Go back on atenolol, take only half tablet daily  Check the  blood pressure 3 or 4  times a week Be sure your blood pressure is between 110/65 and  145/85.  if it is consistently higher or lower, let me know   Next visit  for a   physical exam by December 2016    Please schedule an appointment at the front desk Please come back fasting

## 2014-09-26 ENCOUNTER — Other Ambulatory Visit: Payer: Self-pay | Admitting: Internal Medicine

## 2014-09-28 ENCOUNTER — Telehealth: Payer: Self-pay | Admitting: Internal Medicine

## 2014-09-28 NOTE — Telephone Encounter (Signed)
Pt states she had a GI episode last night and almost fainted. Please return her call. Best# [phone].

## 2014-09-28 NOTE — Telephone Encounter (Signed)
Noted  

## 2014-09-28 NOTE — Telephone Encounter (Signed)
Spoke with Pt, she informed me that she has been constipated for 5 days and woke up last night to go to the bathroom, when she stood she felt lightheaded and she laid down on the floor, Pt denies falling but that she simply laid down on the floor. Pt did not check her blood pressure during that time, and denies any "straining" prior to lightheaded episode. Pt denies any headaches or visual disturbances. She was finally able to get up and go back to bed. This morning her BP was 133/78, not having dizziness/lightheadedness, still no headaches or visual disturbances but she does feel drained and fatigued. She informed me she was finally able to have a bowel movement which was fairly loose, denies any blood but noticed some mucus in stools. Pt also denies any fever, chills, N/V. She has acute visit scheduled tomorrow 09/29/2014 for 0930. Informed her I would send message to Dr. Larose Kells and if any further recommendations from him I would call. Also informed Pt that if she becomes worse, starts running a fever, blood in stools to go to urgent care or ED. Pt verbalized understanding.

## 2014-09-28 NOTE — Telephone Encounter (Signed)
Agree w/ your recommendation, if she feels worse she needs to go to the  ER.

## 2014-09-29 ENCOUNTER — Encounter: Payer: Self-pay | Admitting: Internal Medicine

## 2014-09-29 ENCOUNTER — Ambulatory Visit (INDEPENDENT_AMBULATORY_CARE_PROVIDER_SITE_OTHER): Payer: Medicare Other | Admitting: Internal Medicine

## 2014-09-29 VITALS — BP 128/84 | HR 71 | Temp 97.7°F | Ht 66.0 in | Wt 204.5 lb

## 2014-09-29 DIAGNOSIS — Z09 Encounter for follow-up examination after completed treatment for conditions other than malignant neoplasm: Secondary | ICD-10-CM

## 2014-09-29 DIAGNOSIS — R55 Syncope and collapse: Secondary | ICD-10-CM

## 2014-09-29 DIAGNOSIS — K5900 Constipation, unspecified: Secondary | ICD-10-CM | POA: Diagnosis not present

## 2014-09-29 NOTE — Assessment & Plan Note (Signed)
Vaso vagal event: sx as described likely due to a vasovagal event. No worrisome features, recommend observation Chronic constipation: Recommend daily MiraLAX and Metamucil. She has already been refer to GI, she is due for a colonoscopy.

## 2014-09-29 NOTE — Progress Notes (Signed)
Subjective:    Patient ID: [name], female    DOB: 1945-09-27, 69 y.o.   MRN: [id number]  DOS:  09/29/2014 Type of visit - description : Acute Interval history: 2 nights ago she went to the bathroom for a  BM, she felt lightheaded and had to lay down on the floor, shortly after she was able to go back to her bed and slept well. The next morning she felt well. See phone note for detailed description of sx On further questioning, she reports chronic constipation, able to go to the bathroom only every 5-6 days and is not uncommon to feel lightheadedness after a  BM. Last episode  was more significant the previous ones and that is why she got concerned.     Review of Systems Denies chest pain, difficulty breathing or palpitations. No headache or head injury No fever chills No nausea vomiting She sees red blood per rectum sometimes but not recently.   Past Medical History  Diagnosis Date  . Diabetes mellitus     dr Buddy Duty  . Depression     sees Dr.Cotle  . Hyperlipemia   . Hypertension   . IBS (irritable bowel syndrome)     and dyspepsia  . RLS (restless legs syndrome)     h/o- no longer using Requip  . Adenomatous colon polyp 03/1988  . Osteopenia     dexa 06/2007 and 10/11 Rx CA vitamin d  . Chest pain      Sept 2011: stress test neg  . Diverticulosis   . Fatty liver     Increased LFTs, saw GI 06/2011, likely from fatty liver   . Adenomatous colon polyp 1990  . Anemia     borderline  . Cataract     no surgery  . GERD (gastroesophageal reflux disease)   . History of hiatal hernia   . Arthritis     R shoulder - degenerative   . Memory impairment 08/2014    mild cognitive impairment vs. mild dementia, reommended reinstating of cholinesterase inhibitor     Past Surgical History  Procedure Laterality Date  . Uterine fibroid embolization      90s  . Tonsillectomy    . Polypectomy    . Colonoscopy    . Reverse shoulder arthroplasty Right 04/21/2014    Procedure:  REVERSE SHOULDER ARTHROPLASTY;  Surgeon: Tania Ade, MD;  Location: [facility];  Service: Orthopedics;  Laterality: Right;  Right reverse total shoulder replacement    Social History   Social History  . Marital Status: Married    Spouse Name: [name]  . Number of Children: 3  . Years of Education: N/A   Occupational History  . retired, was a Leisure centre manager)     Therapist, sports  .     Social History Main Topics  . Smoking status: Former Smoker -- 2.00 packs/day for 10 years    Types: Cigarettes    Quit date: 11/26/1975  . Smokeless tobacco: Never Used     Comment: used to smoke 2 ppd  . Alcohol Use: Yes     Comment: rarely  . Drug Use: No  . Sexual Activity: Not on file   Other Topics Concern  . Not on file   Social History Narrative   Lives w/  [name]   2 g-child, twins on the way        Medication List       This list is accurate as of: 09/29/14  8:31 PM.  Always use your most recent med list.               acetaminophen 500 MG tablet  Commonly known as:  TYLENOL  Take 500 mg by mouth every 6 (six) hours as needed.     ARICEPT 5 MG tablet  Generic drug:  donepezil  Take 1 tablet (5 mg total) by mouth at bedtime.     ARIPiprazole 5 MG tablet  Commonly known as:  ABILIFY  Take 2.5 mg by mouth daily before breakfast.     aspirin 81 MG tablet  Take 81 mg by mouth daily.     atenolol 50 MG tablet  Commonly known as:  TENORMIN  Take 0.5 tablets (25 mg total) by mouth daily.     atorvastatin 40 MG tablet  Commonly known as:  LIPITOR  Take 40 mg by mouth daily at 6 PM.     B-D ULTRAFINE III SHORT PEN 31G X 8 MM Misc  Generic drug:  Insulin Pen Needle  USE AS DIRECTED     docusate sodium 100 MG capsule  Commonly known as:  COLACE  Take 1 capsule (100 mg total) by mouth 3 (three) times daily as needed.     DULoxetine 60 MG capsule  Commonly known as:  CYMBALTA  Take 60 mg by mouth daily before breakfast.     fluticasone 50 MCG/ACT nasal spray  Commonly  known as:  FLONASE  Place 2 sprays into both nostrils daily.     glucose blood test strip  Commonly known as:  ONE TOUCH ULTRA TEST  Check twice a day     LEVEMIR FLEXTOUCH 100 UNIT/ML Pen  Generic drug:  Insulin Detemir  Inject 90 Units into the skin daily.     losartan 50 MG tablet  Commonly known as:  COZAAR  TAKE 1 TABLET (50 MG TOTAL) BY MOUTH DAILY.     metFORMIN 1000 MG tablet  Commonly known as:  GLUCOPHAGE  Take 1 tablet (1,000 mg total) by mouth 2 (two) times daily with a meal.     omeprazole 20 MG capsule  Commonly known as:  PRILOSEC  TAKE ONE CAPSULE BY MOUTH EVERY DAY           Objective:   Physical Exam BP 128/84 mmHg  Pulse 71  Temp(Src) 97.7 F (36.5 C) (Oral)  Ht 5\' 6"  (1.676 m)  Wt 204 lb 8 oz (92.761 kg)  BMI 33.02 kg/m2  SpO2 99% General:   Well developed, well nourished . NAD.  HEENT:  Normocephalic . Face symmetric, atraumatic Neck: Normal carotid pulses Lungs:  CTA B Normal respiratory effort, no intercostal retractions, no accessory muscle use. Heart: RRR,  no murmur.  no pretibial edema bilaterally  Abdomen:  Not distended, soft, non-tender. No rebound or rigidity. No mass,organomegaly Skin: Not pale. Not jaundice Neurologic:  alert & oriented X3.  Speech normal, gait appropriate for age and unassisted. EOMI Psych--  Cognition and judgment appear intact.  Cooperative with normal attention span and concentration.  Behavior appropriate. No anxious or depressed appearing.    Assessment & Plan:   Assessment > DM Dr. Buddy Duty HTN Hyperlipidemia Depression Dr. Clovis Pu OSA -- mild, saw Dr Gwenette Greet 2014, no CPAP GI: --IBS, colon polyps, diverticulosis, hiatal hernia --Chronic constipation likely part of her IBS syndrome  --Fatty liver >>> GI eval 2013 Osteopenia B12 deficiency, IM shots RLS MSK: --DJD frozen shoulder Mild cognitive impairment  MMSE 2015 --> 28, on Aricept Chest pain 09-2009, negative  stress test  Plan Vaso  vagal event: sx as described likely due to a vasovagal event. No worrisome features, recommend observation Chronic constipation: Recommend daily MiraLAX and Metamucil. She has already been refer to GI, she is due for a colonoscopy.

## 2014-09-29 NOTE — Patient Instructions (Signed)
For constipation: MiraLAX 17 g daily. Take it with plenty of water Metamucil capsules one or two in the morning  Okay to take an iron OTC Daily unless it worsen your constipation

## 2014-09-29 NOTE — Progress Notes (Signed)
Pre visit review using our clinic review tool, if applicable. No additional management support is needed unless otherwise documented below in the visit note. 

## 2014-10-03 ENCOUNTER — Other Ambulatory Visit: Payer: Self-pay | Admitting: Internal Medicine

## 2014-10-04 ENCOUNTER — Encounter: Payer: Self-pay | Admitting: Internal Medicine

## 2014-10-05 ENCOUNTER — Other Ambulatory Visit: Payer: Self-pay | Admitting: Internal Medicine

## 2014-10-05 MED ORDER — LACTULOSE 10 GM/15ML PO SOLN: 20.0000 g | Freq: Two times a day (BID) | ORAL | Status: DC | PRN

## 2014-10-11 ENCOUNTER — Ambulatory Visit: Payer: Self-pay | Admitting: Physician Assistant

## 2014-10-17 ENCOUNTER — Encounter: Payer: Self-pay | Admitting: Internal Medicine

## 2014-10-24 ENCOUNTER — Other Ambulatory Visit (INDEPENDENT_AMBULATORY_CARE_PROVIDER_SITE_OTHER): Payer: Medicare Other

## 2014-10-24 ENCOUNTER — Encounter: Payer: Self-pay | Admitting: Physician Assistant

## 2014-10-24 ENCOUNTER — Ambulatory Visit (INDEPENDENT_AMBULATORY_CARE_PROVIDER_SITE_OTHER): Payer: Medicare Other | Admitting: Physician Assistant

## 2014-10-24 VITALS — BP 120/80 | HR 68 | Ht 66.5 in | Wt 207.0 lb

## 2014-10-24 DIAGNOSIS — D509 Iron deficiency anemia, unspecified: Secondary | ICD-10-CM | POA: Diagnosis not present

## 2014-10-24 DIAGNOSIS — R195 Other fecal abnormalities: Secondary | ICD-10-CM | POA: Diagnosis not present

## 2014-10-24 LAB — IGA: IgA: 343 mg/dL (ref 68–378)

## 2014-10-24 NOTE — Patient Instructions (Signed)
You have been scheduled for an endoscopy. Please follow written instructions given to you at your visit today. If you use inhalers (even only as needed), please bring them with you on the day of your procedure. Your physician has requested that you go to www.[email or web address].[email or web address] and enter the access code given to you at your visit today. This web site gives a general overview about your procedure. However, you should still follow specific instructions given to you by our office regarding your preparation for the procedure.  Your physician has requested that you go to the basement for lab work before leaving today. Please go to the lab again 1 month from now.   Please start Ferrous Fumarate 1 tablet twice a day. This is an over the counter medication.   Please call back if you have any questions or problems.

## 2014-10-24 NOTE — Progress Notes (Signed)
Patient ID: [name], female   DOB: 17-Feb-1945, 69 y.o.   MRN: [id number]     History of Present Illness: [name] who is known to Dr. Fuller Plan with a history of fatty liver. She has had mildly elevated transaminases generally in the 2 times normal range since 2010. Hepatitis C serology has been negative and hepatitis B serology revealed a positive core antibody and positive surface antibody. Abdominal ultrasound showed hepatic steatosis. She has a history of diabetes, hyperlipidemia, and obesity. She also has a history of hypertension, restless leg syndrome, depression, and irritable bowel syndrome. She recently had a colonoscopy due to a prior history of adenomatous polyps in April 2016. At that time 2 sessile polyps were removed from the descending colon and she was noted to have grade 1 internal hemorrhoids. She was advised to have surveillance in 5 years.  She is here today because she has been found to be anemic. She was evaluated by Dr. Larose Kells in September after having experienced an episode of lightheadedness. Blood work revealed a hemoglobin of 10.8, down from 13 on 04/11/2014. MCV on September 12 was 77.6 down from 79.1 on 04/11/2014. Patient reports she has felt fatigued. She has had no change in her bowel habits or stool caliber. She's had no anorexia or unexplained weight loss. She was advised to take iron pills but states she often forgets to because they upset her stomach and exacerbate her constipation. She is not aware of a family history of celiac disease. She has no complaints of epigastric pain, nausea, or vomiting.   Past Medical History  Diagnosis Date  . Diabetes mellitus     dr Buddy Duty  . Depression     sees Dr.Cotle  . Hyperlipemia   . Hypertension   . IBS (irritable bowel syndrome)     and dyspepsia  . RLS (restless legs syndrome)     h/o- no longer using Requip  . Adenomatous colon polyp 03/1988  . Osteopenia     dexa 06/2007 and 10/11 Rx CA vitamin d  . Chest  pain      Sept 2011: stress test neg  . Diverticulosis   . Fatty liver     Increased LFTs, saw GI 06/2011, likely from fatty liver   . Adenomatous colon polyp 1990  . Anemia     borderline  . Cataract     no surgery  . GERD (gastroesophageal reflux disease)   . History of hiatal hernia   . Arthritis     R shoulder - degenerative   . Memory impairment 08/2014    mild cognitive impairment vs. mild dementia, reommended reinstating of cholinesterase inhibitor     Past Surgical History  Procedure Laterality Date  . Uterine fibroid embolization      90s  . Tonsillectomy    . Polypectomy    . Colonoscopy    . Reverse shoulder arthroplasty Right 04/21/2014    Procedure: REVERSE SHOULDER ARTHROPLASTY;  Surgeon: Tania Ade, MD;  Location: [facility];  Service: Orthopedics;  Laterality: Right;  Right reverse total shoulder replacement   Family History  Problem Relation Age of Onset  . Lung cancer Mother     smoker  . Ovarian cancer Paternal Aunt     ?  Marland Kitchen Breast cancer Neg Hx   . Rectal cancer Neg Hx   . Stomach cancer Neg Hx   . Esophageal cancer Neg Hx   . Diabetes Other     aunts-uncles   .  Colon cancer Father     F dx in his 46s  . Heart attack Other     GM in her 86s  . Stroke Other     aunts-uncles    Social History  Substance Use Topics  . Smoking status: Former Smoker -- 2.00 packs/day for 10 years    Types: Cigarettes    Quit date: 11/26/1975  . Smokeless tobacco: Never Used     Comment: used to smoke 2 ppd  . Alcohol Use: 0.0 oz/week    0 Standard drinks or equivalent per week     Comment: rarely   Current Outpatient Prescriptions  Medication Sig Dispense Refill  . acetaminophen (TYLENOL) 500 MG tablet Take 500 mg by mouth every 6 (six) hours as needed.    . ARIPiprazole (ABILIFY) 5 MG tablet Take 2.5 mg by mouth daily before breakfast.     . aspirin 81 MG tablet Take 81 mg by mouth daily.    Marland Kitchen atenolol (TENORMIN) 50 MG tablet Take 0.5 tablets (25 mg total)  by mouth daily. 15 tablet 2  . atorvastatin (LIPITOR) 40 MG tablet Take 40 mg by mouth daily at 6 PM.     . B-D ULTRAFINE III SHORT PEN 31G X 8 MM MISC USE AS DIRECTED 100 each 2  . docusate sodium (COLACE) 100 MG capsule Take 1 capsule (100 mg total) by mouth 3 (three) times daily as needed. 20 capsule 0  . donepezil (ARICEPT) 5 MG tablet Take 1 tablet (5 mg total) by mouth at bedtime.    . DULoxetine (CYMBALTA) 60 MG capsule Take 60 mg by mouth daily before breakfast.     . fluticasone (FLONASE) 50 MCG/ACT nasal spray Place 2 sprays into both nostrils daily. 16 g 1  . glucose blood (ONE TOUCH ULTRA TEST) test strip Check twice a day 200 each 3  . Insulin Detemir (LEVEMIR FLEXTOUCH) 100 UNIT/ML Pen Inject 90 Units into the skin daily.    Marland Kitchen lactulose (CHRONULAC) 10 GM/15ML solution Take 30 mLs (20 g total) by mouth 2 (two) times daily as needed for mild constipation. 240 mL 0  . losartan (COZAAR) 50 MG tablet TAKE 1 TABLET (50 MG TOTAL) BY MOUTH DAILY. (Patient taking differently: TAKE 1 TABLET (50 MG TOTAL) BY MOUTH DAILY.  (after dinner)) 30 tablet 5  . metFORMIN (GLUCOPHAGE) 1000 MG tablet Take 1 tablet (1,000 mg total) by mouth 2 (two) times daily with a meal. 180 tablet 3  . omeprazole (PRILOSEC) 20 MG capsule Take 1 capsule (20 mg total) by mouth daily. 30 capsule 6   No current facility-administered medications for this visit.   Allergies  Allergen Reactions  . Penicillins     REACTION: unspecified RASH  . Sulfa Antibiotics Nausea Only  . Trulicity [Dulaglutide] Nausea Only     Review of Systems: Gen: Denies any fever, chills, sweats, anorexia,  weakness, malaise, weight loss, and sleep disorder. Reports fatigue for several months CV: Denies chest pain, angina, palpitations, syncope, orthopnea, PND, peripheral edema, and claudication. Resp: Denies dyspnea at rest, dyspnea with exercise, cough, sputum, wheezing, coughing up blood, and pleurisy. GI: Denies vomiting blood, jaundice,  and fecal incontinence.   Denies dysphagia or odynophagia. GU : Denies urinary burning, blood in urine, urinary frequency, urinary hesitancy, nocturnal urination, and urinary incontinence. MS: Denies joint pain, limitation of movement, and swelling, stiffness, low back pain, extremity pain. Denies muscle weakness, cramps, atrophy.  Derm: Denies rash, itching, dry skin, hives, moles, warts, or unhealing  ulcers.  Psych: Denies depression, anxiety, memory loss, suicidal ideation, hallucinations, paranoia, and confusion. Heme: Denies bruising, bleeding, and enlarged lymph nodes. Neuro:  Denies any headaches, dizziness, paresthesia Endo:  Denies any problems with DM, thyroid, adrenal  LAB RESULTS: Blood work on 04/11/2014 revealed a CBC with WBC 9, hemoglobin 13, hematocrit 39.3, platelets 235,000, MCV 79.1. CBC 09/19/2014 WBC 7, hemoglobin 10.8, hematocrit 33.3, platelets 171,000, MCV 77.6. Blood work 09/21/2014 revealed an iron of 45, ferritin 10.6.   Studies:  Colonoscopy 04/15/2014: 2 sessile polyps in descending colon, polypectomy is performed with a cold snare. Grade 1 internal hemorrhoids. Pathology revealed tubular adenoma with no high-grade dysplasia or malignancy identified. She was advised to have surveillance in 5 years. EGD on 03/15/2014 was normal examination.    Physical Exam: BP 120/80 mmHg  Pulse 68  Ht 5' 6.5" (1.689 m)  Wt 207 lb (93.895 kg)  BMI 32.91 kg/m2 General: Pleasant, well developed , Caucasian female in no acute distress Head: Normocephalic and atraumatic Eyes:  sclerae anicteric, conjunctiva pink  Ears: Normal auditory acuity Lungs: Clear throughout to auscultation Heart: Regular rate and rhythm Abdomen: Soft, non distended, non-tender. No masses, no hepatomegaly. Normal bowel sounds Rectal: Brown stool, Hemoccult-positive. Musculoskeletal: Symmetrical with no gross deformities  Extremities: No edema  Neurological: Alert oriented x 4, grossly  nonfocal Psychological:  Alert and cooperative. Normal mood and affect  Assessment and Recommendations: 69 year old female recently found to have an iron deficiency anemia, referred for evaluation. Fecal occult blood is positive today. Recent colonoscopy revealed 2 polyps which were removed but was otherwise nonrevealing. Patient will be scheduled for an EGD to evaluate for possible esophagitis, gastritis, ulcer, etc. as a source of her heme-positive stools and iron deficiency anemia, as well as to obtain small bowel biopsies to evaluate for possible celiac.The risks, benefits, and alternatives to endoscopy with possible biopsy and possible dilation were discussed with the patient and they consent to proceed. A TTG and IgA will be obtained today. Patient will be given a trial of ferrous fumarate one by mouth twice daily. A repeat CBC and iron studies will be drawn in 4 weeks. Further recommendations will be made pending the findings of the above.        Kaija Kovacevic, Vita Barley PA-C 10/24/2014,

## 2014-10-25 LAB — TISSUE TRANSGLUTAMINASE, IGA: Tissue Transglutaminase Ab, IgA: 1 U/mL (ref <4)

## 2014-10-25 NOTE — Progress Notes (Signed)
Reviewed and agree with management plan.  Lavin Petteway T. Handsome Anglin, MD FACG 

## 2014-10-27 ENCOUNTER — Other Ambulatory Visit: Payer: Self-pay | Admitting: Emergency Medicine

## 2014-11-01 ENCOUNTER — Telehealth: Payer: Self-pay | Admitting: Internal Medicine

## 2014-11-01 NOTE — Telephone Encounter (Signed)
[facility] Day - Client TELEPHONE ADVICE RECORD [facility] Patient Name: [name] DOB: 12-27-1945 Initial Comment Caller states she has felt very dizzy, this happened this morning and felt like she will pass out. Nurse Assessment Nurse: Markus Daft, RN, Sherre Poot Date/Time (Eastern Time): 11/01/2014 3:45:36 PM Confirm and document reason for call. If symptomatic, describe symptoms. ---Caller states she has felt very dizzy, this happened this morning and felt like she will pass out. Describes as "mini explosion" felt in back of head. She had to get help to walk and sit down. Has the patient traveled out of the country within the last 30 days? ---Not Applicable Does the patient have any new or worsening symptoms? ---Yes Will a triage be completed? ---Yes Related visit to physician within the last 2 weeks? ---No Does the PT have any chronic conditions? (i.e. diabetes, asthma, etc.) ---Yes List chronic conditions. ---HTN, diabetic Guidelines Guideline Title Affirmed Question Affirmed Notes Dizziness - Vertigo [1] MODERATE dizziness (e.g., vertigo; feels very unsteady, interferes with normal activities) AND [2] has NOT been evaluated by physician for this Final Disposition User See Physician within [address] Hours [place], [place], [place] Comments Appt made with 1:15 pm tomorrow with Dr. Larose Kells. BP 15 min. after this episode was 106/58. Referrals REFERRED TO PCP OFFICE Disagree/Comply: Comply

## 2014-11-01 NOTE — Telephone Encounter (Signed)
Appt noted for tomorrow (11/02/14) at 1:15 pm.

## 2014-11-01 NOTE — Telephone Encounter (Signed)
Pt called in stating infrequent episodes of dizziness & feeling like she is going to pass out and then does not feel well for several hours afterwards. Pt states she had an episode earlier today. Transferred call to [facility].

## 2014-11-02 ENCOUNTER — Ambulatory Visit: Payer: Medicare Other | Admitting: Internal Medicine

## 2014-11-04 ENCOUNTER — Ambulatory Visit (AMBULATORY_SURGERY_CENTER): Payer: Medicare Other | Admitting: Gastroenterology

## 2014-11-04 ENCOUNTER — Encounter: Payer: Self-pay | Admitting: Gastroenterology

## 2014-11-04 VITALS — BP 114/67 | HR 63 | Temp 98.9°F | Resp 27 | Ht 66.5 in | Wt 207.0 lb

## 2014-11-04 DIAGNOSIS — D509 Iron deficiency anemia, unspecified: Secondary | ICD-10-CM

## 2014-11-04 DIAGNOSIS — R195 Other fecal abnormalities: Secondary | ICD-10-CM | POA: Diagnosis not present

## 2014-11-04 DIAGNOSIS — K921 Melena: Secondary | ICD-10-CM | POA: Diagnosis not present

## 2014-11-04 LAB — GLUCOSE, CAPILLARY
Glucose-Capillary: 172 mg/dL — ABNORMAL HIGH (ref 65–99)
Glucose-Capillary: 147 mg/dL — ABNORMAL HIGH (ref 65–99)

## 2014-11-04 MED ORDER — SODIUM CHLORIDE 0.9 % IV SOLN: 500.0000 mL | INTRAVENOUS | Status: DC

## 2014-11-04 NOTE — Patient Instructions (Signed)
YOU HAD AN ENDOSCOPIC PROCEDURE TODAY AT THE [facility]:   Refer to the procedure report that was given to you for any specific questions about what was found during the examination.  If the procedure report does not answer your questions, please call your gastroenterologist to clarify.  If you requested that your care partner not be given the details of your procedure findings, then the procedure report has been included in a sealed envelope for you to review at your convenience later.  YOU SHOULD EXPECT: Some feelings of bloating in the abdomen. Passage of more gas than usual.  Walking can help get rid of the air that was put into your GI tract during the procedure and reduce the bloating. If you had a lower endoscopy (such as a colonoscopy or flexible sigmoidoscopy) you may notice spotting of blood in your stool or on the toilet paper. If you underwent a bowel prep for your procedure, you may not have a normal bowel movement for a few days.  Please Note:  You might notice some irritation and congestion in your nose or some drainage.  This is from the oxygen used during your procedure.  There is no need for concern and it should clear up in a day or so.  SYMPTOMS TO REPORT IMMEDIATELY:    Following upper endoscopy (EGD)  Vomiting of blood or coffee ground material  New chest pain or pain under the shoulder blades  Painful or persistently difficult swallowing  New shortness of breath  Fever of 100F or higher  Black, tarry-looking stools  For urgent or emergent issues, a gastroenterologist can be reached at any hour by calling [phone].   DIET: Your first meal following the procedure should be a small meal and then it is ok to progress to your normal diet. Heavy or fried foods are harder to digest and may make you feel nauseous or bloated.  Likewise, meals heavy in dairy and vegetables can increase bloating.  Drink plenty of fluids but you should avoid alcoholic beverages  for 24 hours.  ACTIVITY:  You should plan to take it easy for the rest of today and you should NOT DRIVE or use heavy machinery until tomorrow (because of the sedation medicines used during the test).    FOLLOW UP: Our staff will call the number listed on your records the next business day following your procedure to check on you and address any questions or concerns that you may have regarding the information given to you following your procedure. If we do not reach you, we will leave a message.  However, if you are feeling well and you are not experiencing any problems, there is no need to return our call.  We will assume that you have returned to your regular daily activities without incident.  If any biopsies were taken you will be contacted by phone or by letter within the next 1-3 weeks.  Please call us at [phone] if you have not heard about the biopsies in 3 weeks.    SIGNATURES/CONFIDENTIALITY: You and/or your care partner have signed paperwork which will be entered into your electronic medical record.  These signatures attest to the fact that that the information above on your After Visit Summary has been reviewed and is understood.  Full responsibility of the confidentiality of this discharge information lies with you and/or your care-partner. 

## 2014-11-04 NOTE — Progress Notes (Signed)
Called to room to assist during endoscopic procedure.  Patient ID and intended procedure confirmed with present staff. Received instructions for my participation in the procedure from the performing physician.  

## 2014-11-04 NOTE — Progress Notes (Signed)
To recovery, report to Sechrist, RN, VSS. 

## 2014-11-04 NOTE — Op Note (Signed)
[facility]  [address] [place], [id number]   ENDOSCOPY PROCEDURE REPORT  PATIENT: Lindsay, Santiago  MR#: [id number] BIRTHDATE: Apr 14, 1945 , 16  yrs. old GENDER: female ENDOSCOPIST: Ladene Artist, MD, Marval Regal REFERRED BY:  Kathlene November, M.D. PROCEDURE DATE:  11/04/2014 PROCEDURE:  EGD w/ biopsy ASA CLASS:     Class III INDICATIONS:  iron deficiency anemia and heme positive stool. MEDICATIONS: Monitored anesthesia care and Propofol 150 mg IV TOPICAL ANESTHETIC: none DESCRIPTION OF PROCEDURE: After the risks benefits and alternatives of the procedure were thoroughly explained, informed consent was obtained.  The LB [id number] endoscope was introduced through the mouth and advanced to the second portion of the duodenum , Without limitations.  The instrument was slowly withdrawn as the mucosa was fully examined.    ESOPHAGUS: The mucosa of the esophagus appeared normal. STOMACH: The mucosa of the stomach appeared normal. DUODENUM: The duodenal mucosa showed no abnormalities in the bulb and 2nd part of the duodenum.  Cold forceps biopsies were taken in the bulb and second portion.  Retroflexed views revealed no abnormalities.   The scope was then withdrawn from the patient and the procedure completed.  COMPLICATIONS: There were no immediate complications.  ENDOSCOPIC IMPRESSION: 1.  The EGD appeared normal; cold forceps biopsies taken in the bulb and second portion  RECOMMENDATIONS: 1. Await pathology results 2. If iron deficiency does not easily correct or it recurs or if persistent heme + stool is noted would proceed with capsule endoscopy   eSigned:  Ladene Artist, MD, [facility] 11/04/2014 4:21 PM

## 2014-11-07 ENCOUNTER — Ambulatory Visit (INDEPENDENT_AMBULATORY_CARE_PROVIDER_SITE_OTHER): Payer: Medicare Other | Admitting: Internal Medicine

## 2014-11-07 ENCOUNTER — Encounter: Payer: Self-pay | Admitting: Internal Medicine

## 2014-11-07 ENCOUNTER — Telehealth: Payer: Self-pay | Admitting: *Deleted

## 2014-11-07 VITALS — BP 124/76 | HR 63 | Temp 97.6°F | Ht 67.0 in | Wt 209.2 lb

## 2014-11-07 DIAGNOSIS — E785 Hyperlipidemia, unspecified: Secondary | ICD-10-CM

## 2014-11-07 DIAGNOSIS — R42 Dizziness and giddiness: Secondary | ICD-10-CM | POA: Diagnosis not present

## 2014-11-07 DIAGNOSIS — I10 Essential (primary) hypertension: Secondary | ICD-10-CM | POA: Diagnosis not present

## 2014-11-07 DIAGNOSIS — Z09 Encounter for follow-up examination after completed treatment for conditions other than malignant neoplasm: Secondary | ICD-10-CM

## 2014-11-07 NOTE — Telephone Encounter (Signed)
  Follow up Call-  Call back number 11/04/2014 04/15/2014  Post procedure Call Back phone  # [phone] cell  Permission to leave phone message Yes Yes     Patient questions:  Do you have a fever, pain , or abdominal swelling? No. Pain Score  0 *  Have you tolerated food without any problems? Yes.    Have you been able to return to your normal activities? Yes.    Do you have any questions about your discharge instructions: Diet   No. Medications  No. Follow up visit  No.  Do you have questions or concerns about your Care? No.  Actions: * If pain score is 4 or above: No action needed, pain <4.

## 2014-11-07 NOTE — Progress Notes (Signed)
Pre visit review using our clinic review tool, if applicable. No additional management support is needed unless otherwise documented below in the visit note. 

## 2014-11-07 NOTE — Progress Notes (Signed)
Subjective:    Patient ID: [name], female    DOB: [birth year], 69 y.o.   MRN: [id number]  DOS:  11/07/2014 Type of visit - description : Routine checkup Interval history:  In general feeling well except for episodes of dizziness 11/01/2014: She was standing up, talking with somebody and she suddenly felt dizzy, she become very weak, unsure how to describe the dizzines exactly, spinning?Marland Kitchen No nausea, not worse by moving her head. she was helped to her room, laid down and rested, the dizziness went away quickly but she was left feeling poorly/tired. Her BP was check shortly after the onset of symptoms and it was 120/56. Blood sugar was not checked. No further symptoms No chest pain, difficulty breathing At the time of the event,  did not have palpitations but from time to time she feels she skips a beat No slurred  speech, diplopia or facial numbness.    Review of Systems  see history of present illness   Past Medical History  Diagnosis Date  . Diabetes mellitus     dr Buddy Duty  . Depression     sees Dr.Cotle  . Hyperlipemia   . Hypertension   . IBS (irritable bowel syndrome)     and dyspepsia  . RLS (restless legs syndrome)     h/o- no longer using Requip  . Adenomatous colon polyp 03/1988  . Osteopenia     dexa 06/2007 and 10/11 Rx CA vitamin d  . Chest pain      Sept 2011: stress test neg  . Diverticulosis   . Fatty liver     Increased LFTs, saw GI 06/2011, likely from fatty liver   . Adenomatous colon polyp 1990  . Anemia     borderline  . Cataract     no surgery  . GERD (gastroesophageal reflux disease)   . History of hiatal hernia   . Arthritis     R shoulder - degenerative   . Memory impairment 08/2014    mild cognitive impairment vs. mild dementia, reommended reinstating of cholinesterase inhibitor     Past Surgical History  Procedure Laterality Date  . Uterine fibroid embolization      90s  . Tonsillectomy    . Polypectomy    . Colonoscopy    .  Reverse shoulder arthroplasty Right 04/21/2014    Procedure: REVERSE SHOULDER ARTHROPLASTY;  Surgeon: Tania Ade, MD;  Location: [facility];  Service: Orthopedics;  Laterality: Right;  Right reverse total shoulder replacement    Social History   Social History  . Marital Status: Married    Spouse Name: [name]  . Number of Children: 3  . Years of Education: N/A   Occupational History  . retired, was a Leisure centre manager)     Therapist, sports  .     Social History Main Topics  . Smoking status: Former Smoker -- 2.00 packs/day for 10 years    Types: Cigarettes    Quit date: 11/26/1975  . Smokeless tobacco: Never Used     Comment: used to smoke 2 ppd  . Alcohol Use: 0.0 oz/week    0 Standard drinks or equivalent per week     Comment: rarely  . Drug Use: No  . Sexual Activity: Not on file   Other Topics Concern  . Not on file   Social History Narrative   Lives w/  [name]   2 g-child, twins on the way        Medication List  This list is accurate as of: 11/07/14 11:59 PM.  Always use your most recent med list.               acetaminophen 500 MG tablet  Commonly known as:  TYLENOL  Take 500 mg by mouth every 6 (six) hours as needed.     ARICEPT 5 MG tablet  Generic drug:  donepezil  Take 10 mg by mouth at bedtime.     ARIPiprazole 5 MG tablet  Commonly known as:  ABILIFY  Take 2.5 mg by mouth daily before breakfast.     aspirin 81 MG tablet  Take 81 mg by mouth daily.     atenolol 50 MG tablet  Commonly known as:  TENORMIN  Take 0.5 tablets (25 mg total) by mouth daily.     atorvastatin 40 MG tablet  Commonly known as:  LIPITOR  Take 40 mg by mouth daily at 6 PM.     B-D ULTRAFINE III SHORT PEN 31G X 8 MM Misc  Generic drug:  Insulin Pen Needle  USE AS DIRECTED     docusate sodium 100 MG capsule  Commonly known as:  COLACE  Take 1 capsule (100 mg total) by mouth 3 (three) times daily as needed.     DULoxetine 60 MG capsule  Commonly known as:   CYMBALTA  Take 60 mg by mouth daily before breakfast.     fluticasone 50 MCG/ACT nasal spray  Commonly known as:  FLONASE  Place 2 sprays into both nostrils daily.     glucose blood test strip  Commonly known as:  ONE TOUCH ULTRA TEST  Check twice a day     lactulose 10 GM/15ML solution  Commonly known as:  CHRONULAC  Take 30 mLs (20 g total) by mouth 2 (two) times daily as needed for mild constipation.     LEVEMIR FLEXTOUCH 100 UNIT/ML Pen  Generic drug:  Insulin Detemir  Inject 90 Units into the skin daily.     losartan 50 MG tablet  Commonly known as:  COZAAR  TAKE 1 TABLET (50 MG TOTAL) BY MOUTH DAILY.     metFORMIN 1000 MG tablet  Commonly known as:  GLUCOPHAGE  Take 1 tablet (1,000 mg total) by mouth 2 (two) times daily with a meal.     omeprazole 20 MG capsule  Commonly known as:  PRILOSEC  Take 1 capsule (20 mg total) by mouth daily.           Objective:   Physical Exam BP 124/76 mmHg  Pulse 63  Temp(Src) 97.6 F (36.4 C) (Oral)  Ht 5\' 7"  (1.702 m)  Wt 209 lb 4 oz (94.915 kg)  BMI 32.77 kg/m2  SpO2 98% General:   Well developed, well nourished . NAD.  HEENT:  Normocephalic . Face symmetric, atraumatic Neck: Normal carotid pulses  Lungs:  CTA B Normal respiratory effort, no intercostal retractions, no accessory muscle use. Heart: RRR,  no murmur.  No pretibial edema bilaterally  Skin: Not pale. Not jaundice Neurologic:  alert & oriented X3.  Speech normal, gait appropriate for age and unassisted. EOMI, pupils equal and reactive Psych--  Cognition and judgment appear intact.  Cooperative with normal attention span and concentration.  Behavior appropriate. No anxious or depressed appearing.      Assessment & Plan:   Assessment > DM Dr. Buddy Duty HTN Hyperlipidemia Depression Dr. Clovis Pu OSA -- mild, saw Dr Gwenette Greet 2014, no CPAP GI: --IBS, colon polyps, diverticulosis, hiatal hernia --Chronic constipation likely  part of her IBS syndrome    --Fatty liver >>> GI eval 2013 Osteopenia B12 deficiency, IM shots RLS MSK: --DJD frozen shoulder Mild cognitive impairment  MMSE 2015 --> 28, on Aricept Chest pain 09-2009, negative stress test   plan: HTN: Seems well-controlled. On a very low dose of beta blockers. Anemia: Last hemoglobin-iron-ferritin satisfactory Hyperlipidemia: She is fasting, get FLP Episode of dizziness: Single episode of dizziness as described above, occasional palpitations, EKG today is normal. I wonder if she had an episode of hypoglycemia. also recommend a carotid ultrasound (TIA?) ;if symptoms recur she will let me know. RTC 12- 2016 for a physical exam

## 2014-11-07 NOTE — Patient Instructions (Addendum)
Get your blood work before you leave     Next visit  for a  visit on exam by December 2016, no need to be fasting.   Please schedule an appointment at the front desk

## 2014-11-08 LAB — LIPID PANEL
Cholesterol: 125 mg/dL (ref 0–200)
HDL: 44.2 mg/dL (ref >39.00)
LDL Cholesterol: 55 mg/dL (ref 0–99)
NonHDL: 81.26
Total CHOL/HDL Ratio: 3
Triglycerides: 132 mg/dL (ref 0.0–149.0)
VLDL: 26.4 mg/dL (ref 0.0–40.0)

## 2014-11-08 NOTE — Assessment & Plan Note (Signed)
HTN: Seems well-controlled. On a very low dose of beta blockers. Anemia: Last hemoglobin-iron-ferritin satisfactory Hyperlipidemia: She is fasting, get FLP Episode of dizziness: Single episode of dizziness as described above, occasional palpitations, EKG today is normal. I wonder if she had an episode of hypoglycemia. also recommend a carotid ultrasound (TIA?) ;if symptoms recur she will let me know. RTC 12- 2016 for a physical exam

## 2014-11-09 ENCOUNTER — Ambulatory Visit (HOSPITAL_BASED_OUTPATIENT_CLINIC_OR_DEPARTMENT_OTHER): Payer: Medicare Other

## 2014-11-10 ENCOUNTER — Ambulatory Visit (HOSPITAL_BASED_OUTPATIENT_CLINIC_OR_DEPARTMENT_OTHER)
Admission: RE | Admit: 2014-11-10 | Discharge: 2014-11-10 | Disposition: A | Discharge status: Home / Self Care | Payer: Medicare Other | Source: Ambulatory Visit | Attending: Internal Medicine | Admitting: Internal Medicine

## 2014-11-10 DIAGNOSIS — R42 Dizziness and giddiness: Secondary | ICD-10-CM | POA: Insufficient documentation

## 2014-11-10 LAB — FECAL OCCULT BLOOD, GUAIAC: Fecal Occult Blood: NEGATIVE

## 2014-11-13 ENCOUNTER — Encounter: Payer: Self-pay | Admitting: Gastroenterology

## 2014-11-14 ENCOUNTER — Ambulatory Visit (HOSPITAL_BASED_OUTPATIENT_CLINIC_OR_DEPARTMENT_OTHER)
Admission: RE | Admit: 2014-11-14 | Discharge: 2014-11-14 | Disposition: A | Discharge status: Home / Self Care | Payer: Medicare Other | Source: Ambulatory Visit | Attending: Internal Medicine | Admitting: Internal Medicine

## 2014-11-14 ENCOUNTER — Telehealth: Payer: Self-pay

## 2014-11-14 DIAGNOSIS — R9389 Abnormal findings on diagnostic imaging of other specified body structures: Secondary | ICD-10-CM

## 2014-11-14 DIAGNOSIS — E042 Nontoxic multinodular goiter: Secondary | ICD-10-CM | POA: Insufficient documentation

## 2014-11-14 NOTE — Telephone Encounter (Signed)
-----   Message from Colon Branch, MD sent at 11/12/2014  9:54 AM EDT ----- Advise patient: Carotid arteries normal. They did see some thyroid nodules. Please schedule a thyroid ultrasound DX nodules

## 2014-11-14 NOTE — Telephone Encounter (Signed)
Pt notified and made aware.  She stated understanding and agreed with plan for thyroid ultrasound.  Thyroid ultrasound ordered.

## 2014-11-16 ENCOUNTER — Encounter: Payer: Self-pay | Admitting: Internal Medicine

## 2014-11-16 NOTE — Telephone Encounter (Signed)
Pt called in to get her ultra sound results.   CB: [phone]

## 2014-11-16 NOTE — Telephone Encounter (Signed)
Received MyChart message from Pt earlier today and has been forwarded to Dr. Larose Kells.

## 2014-11-17 ENCOUNTER — Ambulatory Visit: Payer: Self-pay | Admitting: Gastroenterology

## 2014-11-18 LAB — BASIC METABOLIC PANEL
BUN: 8 mg/dL (ref 4–21)
Creatinine: 0.7 mg/dL (ref 0.5–1.1)
Glucose: 145 mg/dL
Potassium: 4.2 mmol/L (ref 3.4–5.3)
Sodium: 140 mmol/L (ref 137–147)

## 2014-11-18 LAB — TSH: TSH: 0.69 u[IU]/mL (ref 0.41–5.90)

## 2014-11-18 LAB — LIPID PANEL
Cholesterol: 133 mg/dL (ref 0–200)
HDL: 46 mg/dL (ref 35–70)
LDL Cholesterol: 60 mg/dL
LDl/HDL Ratio: 2.9
Triglycerides: 134 mg/dL (ref 40–160)

## 2014-11-18 LAB — MICROALBUMIN, URINE: Microalb, Ur: 2.56

## 2014-11-18 LAB — HM DIABETES EYE EXAM

## 2014-11-18 LAB — HEPATIC FUNCTION PANEL
ALT: 32 U/L (ref 7–35)
AST: 27 U/L (ref 13–35)
Alkaline Phosphatase: 70 U/L (ref 25–125)
Bilirubin, Total: 0.6 mg/dL

## 2014-11-18 LAB — HEMOGLOBIN A1C: Hgb A1c MFr Bld: 8.7 % — AB (ref 4.0–6.0)

## 2014-11-21 ENCOUNTER — Telehealth: Payer: Self-pay | Admitting: Internal Medicine

## 2014-11-21 DIAGNOSIS — E042 Nontoxic multinodular goiter: Secondary | ICD-10-CM

## 2014-11-21 NOTE — Telephone Encounter (Signed)
Caller name:Lisel Relationship to patient:self Can be [phone] Pharmacy:  Reason for call:Patient was told after her thyroid scan that she needs a biopsy of the thyroid.  I do not see a referral for this  Please advise

## 2014-11-21 NOTE — Telephone Encounter (Signed)
Biopsy orders was placed on 11/15/2014, however I have re-ordered.

## 2014-11-24 ENCOUNTER — Other Ambulatory Visit (HOSPITAL_COMMUNITY)
Admission: RE | Admit: 2014-11-24 | Discharge: 2014-11-24 | Disposition: A | Discharge status: Home / Self Care | Payer: Medicare Other | Source: Ambulatory Visit | Attending: Interventional Radiology | Admitting: Interventional Radiology

## 2014-11-24 ENCOUNTER — Telehealth: Payer: Self-pay | Admitting: Internal Medicine

## 2014-11-24 ENCOUNTER — Ambulatory Visit: Admission: RE | Admit: 2014-11-24 | Payer: Medicare Other | Source: Ambulatory Visit

## 2014-11-24 ENCOUNTER — Ambulatory Visit
Admission: RE | Admit: 2014-11-24 | Discharge: 2014-11-24 | Disposition: A | Discharge status: Home / Self Care | Payer: Medicare Other | Source: Ambulatory Visit | Attending: Internal Medicine | Admitting: Internal Medicine

## 2014-11-24 DIAGNOSIS — E042 Nontoxic multinodular goiter: Secondary | ICD-10-CM

## 2014-11-24 DIAGNOSIS — E041 Nontoxic single thyroid nodule: Secondary | ICD-10-CM

## 2014-11-24 NOTE — Telephone Encounter (Signed)
Spoke with Pt's husband, Art, he informed me that the Pt saw her Neurologist earlier today and he requested complete Thyroid Panel/Profile due to memory issues, Art wanted to know if we can order Panel/Profile instead of individual TSH, Free T3 and Free T4. Informed Pt that Dr. Larose Kells is not here this afternoon and would send him a message but may be in the morning before I hear back. Art verbalized understanding.

## 2014-11-24 NOTE — Telephone Encounter (Signed)
Please advise, Pt has Thyroid biopsy scheduled today at 4 PM.

## 2014-11-24 NOTE — Telephone Encounter (Signed)
Caller name: Art Relationship to patient: spouse Can be reached: [phone]  Reason for call: Pt spouse wanting to know if the labs orders are the "full panel" for complete thyroid testing. Another doctor is saying she needs the "full panel". He is requesting call from clinician to discuss labs.

## 2014-11-24 NOTE — Telephone Encounter (Signed)
See other phone note opened today 11/24/2014.

## 2014-11-24 NOTE — Telephone Encounter (Signed)
Get a  TSH, free T3 and free T4: DX thyroid nodules

## 2014-11-24 NOTE — Telephone Encounter (Signed)
Caller name: Self   Can be reached: [phone]   Reason for call: Patient requesting to have Thyroid Panel done while she is doing labs today. States that she is having a Thyroid Scan done later today.

## 2014-11-24 NOTE — Telephone Encounter (Signed)
Labs ordered.

## 2014-11-25 ENCOUNTER — Other Ambulatory Visit (INDEPENDENT_AMBULATORY_CARE_PROVIDER_SITE_OTHER): Payer: Medicare Other

## 2014-11-25 DIAGNOSIS — D509 Iron deficiency anemia, unspecified: Secondary | ICD-10-CM

## 2014-11-25 DIAGNOSIS — R195 Other fecal abnormalities: Secondary | ICD-10-CM

## 2014-11-25 LAB — CBC WITH DIFFERENTIAL/PLATELET
Basophils Absolute: 0 10*3/uL (ref 0.0–0.1)
Basophils Relative: 0.4 % (ref 0.0–3.0)
Eosinophils Absolute: 0.3 10*3/uL (ref 0.0–0.7)
Eosinophils Relative: 2.7 % (ref 0.0–5.0)
HCT: 40.5 % (ref 36.0–46.0)
Hemoglobin: 13 g/dL (ref 12.0–15.0)
Lymphocytes Relative: 29.9 % (ref 12.0–46.0)
Lymphs Abs: 3.4 10*3/uL (ref 0.7–4.0)
MCHC: 32.2 g/dL (ref 30.0–36.0)
MCV: 78.6 fl (ref 78.0–100.0)
Monocytes Absolute: 0.5 10*3/uL (ref 0.1–1.0)
Monocytes Relative: 4.5 % (ref 3.0–12.0)
Neutro Abs: 7.2 10*3/uL (ref 1.4–7.7)
Neutrophils Relative %: 62.5 % (ref 43.0–77.0)
Platelets: 243 10*3/uL (ref 150.0–400.0)
RBC: 5.16 Mil/uL — ABNORMAL HIGH (ref 3.87–5.11)
RDW: 16.7 % — ABNORMAL HIGH (ref 11.5–15.5)
WBC: 11.5 10*3/uL — ABNORMAL HIGH (ref 4.0–10.5)

## 2014-11-25 LAB — IBC PANEL
Iron: 112 ug/dL (ref 42–145)
Saturation Ratios: 23.9 % (ref 20.0–50.0)
Transferrin: 335 mg/dL (ref 212.0–360.0)

## 2014-11-25 LAB — FERRITIN: Ferritin: 27.1 ng/mL (ref 10.0–291.0)

## 2014-11-25 NOTE — Telephone Encounter (Signed)
Thyroid panel with TSH ordered, will also test Free T3 and Free T4.

## 2014-11-25 NOTE — Addendum Note (Signed)
Addended by: Wilfrid Lund on: 11/25/2014 12:35 PM   Modules accepted: Orders

## 2014-11-25 NOTE — Telephone Encounter (Signed)
Okay to order a thyroid panel which include: TSH  TOTAL T4  T3 UPTAKE  Free Thyroxine Index

## 2014-11-28 ENCOUNTER — Other Ambulatory Visit: Payer: Self-pay

## 2014-11-28 ENCOUNTER — Other Ambulatory Visit: Payer: Self-pay | Admitting: *Deleted

## 2014-11-28 DIAGNOSIS — R9389 Abnormal findings on diagnostic imaging of other specified body structures: Secondary | ICD-10-CM

## 2014-11-28 DIAGNOSIS — E042 Nontoxic multinodular goiter: Secondary | ICD-10-CM

## 2014-11-28 DIAGNOSIS — D509 Iron deficiency anemia, unspecified: Secondary | ICD-10-CM

## 2014-11-29 LAB — HM DIABETES FOOT EXAM: HM Diabetic Foot Exam: ABNORMAL

## 2014-11-30 ENCOUNTER — Other Ambulatory Visit: Payer: Self-pay

## 2014-11-30 ENCOUNTER — Other Ambulatory Visit (INDEPENDENT_AMBULATORY_CARE_PROVIDER_SITE_OTHER): Payer: Medicare Other

## 2014-11-30 DIAGNOSIS — E041 Nontoxic single thyroid nodule: Secondary | ICD-10-CM

## 2014-11-30 DIAGNOSIS — E042 Nontoxic multinodular goiter: Secondary | ICD-10-CM

## 2014-11-30 LAB — T3, FREE: T3, Free: 2.8 pg/mL (ref 2.3–4.2)

## 2014-11-30 LAB — THYROID PANEL WITH TSH
Free Thyroxine Index: 2.3 (ref 1.4–3.8)
T3 Uptake: 29 % (ref 22–35)
T4, Total: 7.8 ug/dL (ref 4.5–12.0)
TSH: 0.657 u[IU]/mL (ref 0.350–4.500)

## 2014-11-30 LAB — T4, FREE: Free T4: 1.01 ng/dL (ref 0.80–1.80)

## 2014-12-05 ENCOUNTER — Other Ambulatory Visit: Payer: Medicare Other

## 2014-12-07 ENCOUNTER — Ambulatory Visit (INDEPENDENT_AMBULATORY_CARE_PROVIDER_SITE_OTHER): Payer: Medicare Other | Admitting: Internal Medicine

## 2014-12-07 ENCOUNTER — Encounter: Payer: Self-pay | Admitting: Internal Medicine

## 2014-12-07 VITALS — BP 118/76 | HR 78 | Temp 97.9°F | Ht 67.0 in | Wt 206.2 lb

## 2014-12-07 DIAGNOSIS — R42 Dizziness and giddiness: Secondary | ICD-10-CM

## 2014-12-07 DIAGNOSIS — Z09 Encounter for follow-up examination after completed treatment for conditions other than malignant neoplasm: Secondary | ICD-10-CM

## 2014-12-07 NOTE — Progress Notes (Signed)
Pre visit review using our clinic review tool, if applicable. No additional management support is needed unless otherwise documented below in the visit note. 

## 2014-12-07 NOTE — Assessment & Plan Note (Signed)
Dizziness: Unclear etiology, symptoms are quite not specific; given long standing history of diabetes, atypical presentation CAD must be suspected. Will refer to cardiology to see if they agree. Recent carotid ultrasound did not show any blockages. In the meantime I strongly recommend to check his her CBGs right at the time of the symptoms to rule out hypoglycemia. If  symptoms change, severe: Needs to call or go to the ER. Also encouraged to discuss symptoms with her neurologist

## 2014-12-07 NOTE — Patient Instructions (Signed)
We are referring you  to cardiology, also discussed  symptoms with neurology.  If you have worse, intense or more frequent symptoms:  call or go to the ER

## 2014-12-07 NOTE — Progress Notes (Signed)
Subjective:    Patient ID: [name], female    DOB: 07-26-1945, 69 y.o.   MRN: [id number]  DOS:  12/07/2014 Type of visit - description : Acute visit Interval history:  Since the last time she was here continue with episodes of on and off dizziness, hard to describe, not spinning or off balance. Symptoms last a few seconds, not necessarily exertional. No associated nausea, chest pain, diaphoresis. Has never checked her blood sugars during the episodes. No syncope but states she "could pass out". She has an associated discomfort at the top of the head, not necessarily described as a headache On and off palpitations but they are not associated with symptoms.   Review of Systems   Past Medical History  Diagnosis Date  . Diabetes mellitus     dr Buddy Duty  . Depression     sees Dr.Cotle  . Hyperlipemia   . Hypertension   . IBS (irritable bowel syndrome)     and dyspepsia  . RLS (restless legs syndrome)     h/o- no longer using Requip  . Adenomatous colon polyp 03/1988  . Osteopenia     dexa 06/2007 and 10/11 Rx CA vitamin d  . Chest pain      Sept 2011: stress test neg  . Diverticulosis   . Fatty liver     Increased LFTs, saw GI 06/2011, likely from fatty liver   . Adenomatous colon polyp 1990  . Anemia     borderline  . Cataract     no surgery  . GERD (gastroesophageal reflux disease)   . History of hiatal hernia   . Arthritis     R shoulder - degenerative   . Memory impairment 08/2014    mild cognitive impairment vs. mild dementia, reommended reinstating of cholinesterase inhibitor     Past Surgical History  Procedure Laterality Date  . Uterine fibroid embolization      90s  . Tonsillectomy    . Polypectomy    . Colonoscopy    . Reverse shoulder arthroplasty Right 04/21/2014    Procedure: REVERSE SHOULDER ARTHROPLASTY;  Surgeon: Tania Ade, MD;  Location: [facility];  Service: Orthopedics;  Laterality: Right;  Right reverse total shoulder replacement     Social History   Social History  . Marital Status: Married    Spouse Name: [name]  . Number of Children: 3  . Years of Education: N/A   Occupational History  . retired, was a Leisure centre manager)     Therapist, sports  .     Social History Main Topics  . Smoking status: Former Smoker -- 2.00 packs/day for 10 years    Types: Cigarettes    Quit date: 11/26/1975  . Smokeless tobacco: Never Used     Comment: used to smoke 2 ppd  . Alcohol Use: 0.0 oz/week    0 Standard drinks or equivalent per week     Comment: rarely  . Drug Use: No  . Sexual Activity: Not on file   Other Topics Concern  . Not on file   Social History Narrative   Lives w/  [name]   2 g-child, twins on the way        Medication List       This list is accurate as of: 12/07/14  6:05 PM.  Always use your most recent med list.               acetaminophen 500 MG tablet  Commonly known as:  TYLENOL  Take 500 mg by mouth every 6 (six) hours as needed.     ARICEPT 10 MG tablet  Generic drug:  donepezil  Take 2 tablets (20 mg total) by mouth at bedtime.     ARIPiprazole 5 MG tablet  Commonly known as:  ABILIFY  Take 2.5 mg by mouth daily before breakfast.     aspirin 81 MG tablet  Take 81 mg by mouth daily.     atenolol 50 MG tablet  Commonly known as:  TENORMIN  Take 0.5 tablets (25 mg total) by mouth daily.     atorvastatin 40 MG tablet  Commonly known as:  LIPITOR  Take 40 mg by mouth daily at 6 PM.     B-D ULTRAFINE III SHORT PEN 31G X 8 MM Misc  Generic drug:  Insulin Pen Needle  USE AS DIRECTED     docusate sodium 100 MG capsule  Commonly known as:  COLACE  Take 1 capsule (100 mg total) by mouth 3 (three) times daily as needed.     DULoxetine 60 MG capsule  Commonly known as:  CYMBALTA  Take 60 mg by mouth daily before breakfast.     fluticasone 50 MCG/ACT nasal spray  Commonly known as:  FLONASE  Place 2 sprays into both nostrils daily.     glucose blood test strip  Commonly  known as:  ONE TOUCH ULTRA TEST  Check twice a day     lactulose 10 GM/15ML solution  Commonly known as:  CHRONULAC  Take 30 mLs (20 g total) by mouth 2 (two) times daily as needed for mild constipation.     LEVEMIR FLEXTOUCH 100 UNIT/ML Pen  Generic drug:  Insulin Detemir  Inject 90 Units into the skin daily.     losartan 50 MG tablet  Commonly known as:  COZAAR  TAKE 1 TABLET (50 MG TOTAL) BY MOUTH DAILY.     metFORMIN 1000 MG tablet  Commonly known as:  GLUCOPHAGE  Take 1 tablet (1,000 mg total) by mouth 2 (two) times daily with a meal.     omeprazole 20 MG capsule  Commonly known as:  PRILOSEC  Take 1 capsule (20 mg total) by mouth daily.           Objective:   Physical Exam BP 118/76 mmHg  Pulse 78  Temp(Src) 97.9 F (36.6 C) (Oral)  Ht 5\' 7"  (1.702 m)  Wt 206 lb 4 oz (93.554 kg)  BMI 32.30 kg/m2  SpO2 97% General:   Well developed, well nourished . NAD.  HEENT:  Normocephalic . Face symmetric, atraumatic Lungs:  CTA B Normal respiratory effort, no intercostal retractions, no accessory muscle use. Heart: RRR,  no murmur.  No pretibial edema bilaterally  Skin: Not pale. Not jaundice Neurologic:  alert & oriented X3.  Speech normal, gait appropriate for age and unassisted. EOMI. Motor symmetric. Psych--  Cognition and judgment appear intact.  Cooperative with normal attention span and concentration.  Behavior appropriate. No anxious or depressed appearing.      Assessment & Plan:   Assessment > DM Dr. Buddy Duty HTN Hyperlipidemia Depression Dr. Clovis Pu OSA -- mild, saw Dr Gwenette Greet 2014, no CPAP GI: --IBS, colon polyps, diverticulosis, hiatal hernia --Chronic constipation likely part of her IBS syndrome  --Fatty liver >>> GI eval 2013 Osteopenia B12 deficiency, IM shots RLS MSK: --DJD frozen shoulder Mild cognitive impairment  MMSE 2015 --> 28, on Aricept, sees neurology Chest pain 09-2009, negative stress test Thyroid nodules: Incidental  by  Carotid US, BX 11-2014: Atypical findings, [facility], f/u  Endocrine  PLAN: Dizziness: Unclear etiology, symptoms are quite not specific; given long standing history of diabetes, atypical presentation CAD must be suspected. Will refer to cardiology to see if they agree. Recent carotid ultrasound did not show any blockages. In the meantime I strongly recommend to check his her CBGs right at the time of the symptoms to rule out hypoglycemia. If  symptoms change, severe: Needs to call or go to the ER. Also encouraged to discuss symptoms with her neurologist

## 2014-12-08 ENCOUNTER — Telehealth: Payer: Self-pay | Admitting: Internal Medicine

## 2014-12-08 ENCOUNTER — Ambulatory Visit: Payer: Self-pay | Admitting: Psychology

## 2014-12-08 ENCOUNTER — Encounter: Payer: Self-pay | Admitting: Internal Medicine

## 2014-12-08 ENCOUNTER — Ambulatory Visit (INDEPENDENT_AMBULATORY_CARE_PROVIDER_SITE_OTHER): Payer: Medicare Other | Admitting: Internal Medicine

## 2014-12-08 VITALS — BP 158/87 | HR 65 | Ht 67.0 in | Wt 203.4 lb

## 2014-12-08 DIAGNOSIS — R42 Dizziness and giddiness: Secondary | ICD-10-CM

## 2014-12-08 NOTE — Telephone Encounter (Signed)
New Message    Pt calling stating that she had an appt today and can't remember what was said, enough to tell her husband what was discussed. Pt is requesting a call back to get a recap. Please call back and advise.

## 2014-12-08 NOTE — Patient Instructions (Signed)
Your physician recommends that you continue on your current medications as directed. Please refer to the Current Medication list given to you today. There is no definite follow up with Dr. Harrington Challenger at this time.  She will be speaking with Dr. Larose Kells to determine plan of care.

## 2014-12-08 NOTE — Telephone Encounter (Signed)
Called patient back who states she has a hard time remembering things and would like her husband to hear a run down of what was dicussed today at her clinic appointment.     Advised patient and husband (speaker phone) that Dr. Harrington Challenger will be discussing her with Dr. Larose Kells (PCP) and according to her note would recommends patient follow up with neurology.    Patient and husband are appreciative for the call back.

## 2014-12-08 NOTE — Progress Notes (Signed)
Cardiology Office Note   Date:  12/08/2014   ID:  [name], [name] 02-Mar-1945, MRN [id number]  PCP:  Kathlene November, MD  Cardiologist:   Dorris Carnes, MD   No chief complaint on file. Pt referred for dizziness     History of Present Illness: [name] is a 69 y.o. female with a history of infreq episodes of dizziness  Will feel a " soft explosion sensation at top of head with dizziness"  Brief  Weak after  Back to normal 20 min later  May 6 total  Started about 1 year ago [facility] not having spells feels fine Not associated with any acitvity. Usually when sitting   Last speel a few days ago.On way to [facility]  Was dring.  HIt her No other neuro complaints.   NO fluttering at time    Pt pulled over  When recovered drove home  Has not happened since.   Spell prior was wks prior.      Current Outpatient Prescriptions  Medication Sig Dispense Refill  . acetaminophen (TYLENOL) 500 MG tablet Take 500 mg by mouth every 6 (six) hours as needed.    . ARIPiprazole (ABILIFY) 5 MG tablet Take 2.5 mg by mouth daily before breakfast.     . aspirin 81 MG tablet Take 81 mg by mouth daily.    Marland Kitchen atenolol (TENORMIN) 50 MG tablet Take 0.5 tablets (25 mg total) by mouth daily. 15 tablet 2  . atorvastatin (LIPITOR) 40 MG tablet Take 40 mg by mouth daily at 6 PM.     . B-D ULTRAFINE III SHORT PEN 31G X 8 MM MISC USE AS DIRECTED 100 each 2  . donepezil (ARICEPT) 10 MG tablet Take 2 tablets (20 mg total) by mouth at bedtime.    . DULoxetine (CYMBALTA) 60 MG capsule Take 60 mg by mouth daily before breakfast.     . glucose blood (ONE TOUCH ULTRA TEST) test strip Check twice a day 200 each 3  . Insulin Detemir (LEVEMIR FLEXTOUCH) 100 UNIT/ML Pen Inject 90 Units into the skin daily.    Marland Kitchen losartan (COZAAR) 50 MG tablet TAKE 1 TABLET (50 MG TOTAL) BY MOUTH DAILY. (Patient taking differently: TAKE 1 TABLET (50 MG TOTAL) BY MOUTH DAILY.  (after dinner)) 30 tablet 5  . metFORMIN (GLUCOPHAGE) 1000 MG tablet  Take 1 tablet (1,000 mg total) by mouth 2 (two) times daily with a meal. 180 tablet 3  . omeprazole (PRILOSEC) 20 MG capsule Take 1 capsule (20 mg total) by mouth daily. 30 capsule 6   No current facility-administered medications for this visit.    Allergies:   Sulfa antibiotics; Trulicity; and Penicillins   Past Medical History  Diagnosis Date  . Diabetes mellitus     dr Buddy Duty  . Depression     sees Dr.Cotle  . Hyperlipemia   . Hypertension   . IBS (irritable bowel syndrome)     and dyspepsia  . RLS (restless legs syndrome)     h/o- no longer using Requip  . Adenomatous colon polyp 03/1988  . Osteopenia     dexa 06/2007 and 10/11 Rx CA vitamin d  . Chest pain      Sept 2011: stress test neg  . Diverticulosis   . Fatty liver     Increased LFTs, saw GI 06/2011, likely from fatty liver   . Adenomatous colon polyp 1990  . Anemia     borderline  . Cataract  no surgery  . GERD (gastroesophageal reflux disease)   . History of hiatal hernia   . Arthritis     R shoulder - degenerative   . Memory impairment 08/2014    mild cognitive impairment vs. mild dementia, reommended reinstating of cholinesterase inhibitor     Past Surgical History  Procedure Laterality Date  . Uterine fibroid embolization      90s  . Tonsillectomy    . Polypectomy    . Colonoscopy    . Reverse shoulder arthroplasty Right 04/21/2014    Procedure: REVERSE SHOULDER ARTHROPLASTY;  Surgeon: Tania Ade, MD;  Location: [facility];  Service: Orthopedics;  Laterality: Right;  Right reverse total shoulder replacement     Social History:  The patient  reports that she quit smoking about 39 years ago. Her smoking use included Cigarettes. She has a 20 pack-year smoking history. She has never used smokeless tobacco. She reports that she drinks alcohol. She reports that she does not use illicit drugs.   Family History:  The patient's family history includes Colon cancer in her father; Diabetes in her other; Heart  attack in her other; Lung cancer in her mother; Ovarian cancer in her paternal aunt; Stroke in her other. There is no history of Breast cancer, Rectal cancer, Stomach cancer, or Esophageal cancer.    ROS:  Please see the history of present illness. All other systems are reviewed and  Negative to the above problem except as noted.    PHYSICAL EXAM: VS:  BP 158/87 mmHg  Pulse 65  Ht 5\' 7"  (1.702 m)  Wt 92.262 kg (203 lb 6.4 oz)  BMI 31.85 kg/m2  GEN: Well nourished, well developed, in no acute distress HEENT: normal Neck: no JVD, carotid bruits, or masses Cardiac: RRR; no murmurs, rubs, or gallops,no edema  Respiratory:  clear to auscultation bilaterally, normal work of breathing GI: soft, nontender, nondistended, + BS  No hepatomegaly  MS: no deformity Moving all extremities   Skin: warm and dry, no rash Neuro:  Strength and sensation are intact Psych: euthymic mood, full affect   EKG:  EKG is ordered today. On 10/31  SR 70 bpm    Lipid Panel    Component Value Date/Time   CHOL 125 11/07/2014 1543   TRIG 132.0 11/07/2014 1543   HDL 44.20 11/07/2014 1543   CHOLHDL 3 11/07/2014 1543   VLDL 26.4 11/07/2014 1543   LDLCALC 55 11/07/2014 1543   LDLDIRECT 134.7 12/06/2010 0807      Wt Readings from Last 3 Encounters:  12/08/14 92.262 kg (203 lb 6.4 oz)  12/07/14 93.554 kg (206 lb 4 oz)  11/07/14 94.915 kg (209 lb 4 oz)      ASSESSMENT AND PLAN:  1.  Dizziness  SPells are brief  Pt has not passed out  Infrequent.  Exam:  Pt is a little unsteady on feet  Otherwise no definite abnormalitiy  BP is labile but goes up with standing.  I am not convinced this represents orthostasis  Spells occur usually while sitting   I have discussed with pt  I would recomm referal backt to neurology  She has been seen by Dr Everette Rank in [place] in the past   I have told her to gently take her carotid pulse when she has spell again  ? afb  I am not convinced this represents that  Pt needs to increase  her acitvity onced spells defined  Used to go to [employer]     Signed,  Dorris Carnes, MD  12/08/2014 10:21 AM    [address], [place], [place]  [address] Phone: [phone]; Fax: [phone]

## 2014-12-13 ENCOUNTER — Encounter: Payer: Self-pay | Admitting: Internal Medicine

## 2014-12-13 ENCOUNTER — Other Ambulatory Visit: Payer: Self-pay | Admitting: Internal Medicine

## 2014-12-13 DIAGNOSIS — E041 Nontoxic single thyroid nodule: Secondary | ICD-10-CM

## 2014-12-14 ENCOUNTER — Ambulatory Visit
Admission: RE | Admit: 2014-12-14 | Discharge: 2014-12-14 | Disposition: A | Discharge status: Home / Self Care | Payer: Medicare Other | Source: Ambulatory Visit | Attending: Internal Medicine | Admitting: Internal Medicine

## 2014-12-14 ENCOUNTER — Other Ambulatory Visit (HOSPITAL_COMMUNITY)
Admission: RE | Admit: 2014-12-14 | Discharge: 2014-12-14 | Disposition: A | Discharge status: Home / Self Care | Payer: Medicare Other | Source: Ambulatory Visit | Attending: Radiology | Admitting: Radiology

## 2014-12-14 DIAGNOSIS — E041 Nontoxic single thyroid nodule: Secondary | ICD-10-CM | POA: Diagnosis present

## 2014-12-15 ENCOUNTER — Other Ambulatory Visit: Payer: Self-pay | Admitting: Neurology

## 2014-12-15 ENCOUNTER — Ambulatory Visit (INDEPENDENT_AMBULATORY_CARE_PROVIDER_SITE_OTHER): Payer: 59 | Admitting: Psychology

## 2014-12-15 DIAGNOSIS — F4323 Adjustment disorder with mixed anxiety and depressed mood: Secondary | ICD-10-CM

## 2014-12-15 DIAGNOSIS — R42 Dizziness and giddiness: Secondary | ICD-10-CM

## 2014-12-16 ENCOUNTER — Ambulatory Visit
Admission: RE | Admit: 2014-12-16 | Discharge: 2014-12-16 | Disposition: A | Discharge status: Home / Self Care | Payer: Medicare Other | Source: Ambulatory Visit | Attending: Neurology | Admitting: Neurology

## 2014-12-16 DIAGNOSIS — R42 Dizziness and giddiness: Secondary | ICD-10-CM

## 2014-12-25 ENCOUNTER — Other Ambulatory Visit: Payer: Self-pay | Admitting: Internal Medicine

## 2014-12-26 ENCOUNTER — Other Ambulatory Visit (INDEPENDENT_AMBULATORY_CARE_PROVIDER_SITE_OTHER): Payer: Medicare Other

## 2014-12-26 DIAGNOSIS — D509 Iron deficiency anemia, unspecified: Secondary | ICD-10-CM

## 2014-12-26 LAB — CBC WITH DIFFERENTIAL/PLATELET
Basophils Absolute: 0 10*3/uL (ref 0.0–0.1)
Basophils Relative: 0.5 % (ref 0.0–3.0)
Eosinophils Absolute: 0.2 10*3/uL (ref 0.0–0.7)
Eosinophils Relative: 3.2 % (ref 0.0–5.0)
HCT: 39.2 % (ref 36.0–46.0)
Hemoglobin: 12.7 g/dL (ref 12.0–15.0)
Lymphocytes Relative: 29 % (ref 12.0–46.0)
Lymphs Abs: 2.2 10*3/uL (ref 0.7–4.0)
MCHC: 32.5 g/dL (ref 30.0–36.0)
MCV: 79.3 fl (ref 78.0–100.0)
Monocytes Absolute: 0.3 10*3/uL (ref 0.1–1.0)
Monocytes Relative: 4.1 % (ref 3.0–12.0)
Neutro Abs: 4.7 10*3/uL (ref 1.4–7.7)
Neutrophils Relative %: 63.2 % (ref 43.0–77.0)
Platelets: 217 10*3/uL (ref 150.0–400.0)
RBC: 4.94 Mil/uL (ref 3.87–5.11)
RDW: 16.6 % — ABNORMAL HIGH (ref 11.5–15.5)
WBC: 7.5 10*3/uL (ref 4.0–10.5)

## 2014-12-26 LAB — IBC PANEL
Iron: 49 ug/dL (ref 42–145)
Saturation Ratios: 11.5 % — ABNORMAL LOW (ref 20.0–50.0)
Transferrin: 304 mg/dL (ref 212.0–360.0)

## 2014-12-27 ENCOUNTER — Encounter: Payer: Self-pay | Admitting: Internal Medicine

## 2014-12-27 ENCOUNTER — Other Ambulatory Visit: Payer: Self-pay | Admitting: *Deleted

## 2014-12-27 ENCOUNTER — Ambulatory Visit (INDEPENDENT_AMBULATORY_CARE_PROVIDER_SITE_OTHER): Payer: Medicare Other | Admitting: Internal Medicine

## 2014-12-27 VITALS — BP 122/76 | HR 75 | Temp 98.1°F | Ht 67.0 in | Wt 203.1 lb

## 2014-12-27 DIAGNOSIS — Z0000 Encounter for general adult medical examination without abnormal findings: Secondary | ICD-10-CM | POA: Diagnosis not present

## 2014-12-27 DIAGNOSIS — D509 Iron deficiency anemia, unspecified: Secondary | ICD-10-CM

## 2014-12-27 DIAGNOSIS — Z09 Encounter for follow-up examination after completed treatment for conditions other than malignant neoplasm: Secondary | ICD-10-CM

## 2014-12-27 DIAGNOSIS — R42 Dizziness and giddiness: Secondary | ICD-10-CM | POA: Diagnosis not present

## 2014-12-27 NOTE — Patient Instructions (Signed)
Before you leave the office:  Schedule a routine office visit or check up to be done in 8 months.   You don't need to be fasting Front desk:    30

## 2014-12-27 NOTE — Progress Notes (Signed)
Subjective:    Patient ID: [name], female    DOB: 1945/02/09, 69 y.o.   MRN: [id number]  DOS:  12/27/2014 Type of visit - description : CPX Interval history: No major concern except for dizziness, continue to be an issue to the patient. Labs reviewed: No due for any testing Medication reviewed, good compliance without apparent side effects  Review of Systems  Constitutional: No fever. No chills. No unexplained wt changes. No unusual sweats  HEENT: No dental problems, no ear discharge, no facial swelling, no voice changes. No eye discharge, no eye  redness , no  intolerance to light   Respiratory: No wheezing , no  difficulty breathing. No cough , no mucus production  Cardiovascular: No CP, no leg swelling , no  Palpitations  GI: no nausea, no vomiting, no diarrhea , no  abdominal pain.  No blood in the stools. No dysphagia, no odynophagia    Endocrine: No polyphagia, no polyuria , no polydipsia  GU: No dysuria, gross hematuria, difficulty urinating. No urinary urgency, no frequency.  Musculoskeletal: No joint swellings or unusual aches or pains  Skin: No change in the color of the skin, palor , no  Rash  Allergic, immunologic: No environmental allergies , no  food allergies  Neurological:  no syncope. No headaches. No diplopia, no slurred, no slurred speech, no motor deficits, no facial  Numbness  Hematological: No enlarged lymph nodes, no easy bruising , no unusual bleedings  Psychiatry: No suicidal ideas, no hallucinations, no beavior problems, no confusion.  No unusual/severe anxiety, no depression  Past Medical History  Diagnosis Date  . Diabetes mellitus     dr Buddy Duty  . Depression     sees Dr.Cotle  . Hyperlipemia   . Hypertension   . IBS (irritable bowel syndrome)     and dyspepsia  . RLS (restless legs syndrome)     h/o- no longer using Requip  . Adenomatous colon polyp 03/1988  . Osteopenia     dexa 06/2007 and 10/11 Rx CA vitamin d  . Chest  pain      Sept 2011: stress test neg  . Diverticulosis   . Fatty liver     Increased LFTs, saw GI 06/2011, likely from fatty liver   . Adenomatous colon polyp 1990  . Anemia     borderline  . Cataract     no surgery  . GERD (gastroesophageal reflux disease)   . History of hiatal hernia   . Arthritis     R shoulder - degenerative   . Memory impairment 08/2014    mild cognitive impairment vs. mild dementia, reommended reinstating of cholinesterase inhibitor     Past Surgical History  Procedure Laterality Date  . Uterine fibroid embolization      90s  . Tonsillectomy    . Polypectomy    . Colonoscopy    . Reverse shoulder arthroplasty Right 04/21/2014    Procedure: REVERSE SHOULDER ARTHROPLASTY;  Surgeon: Tania Ade, MD;  Location: [place];  Service: Orthopedics;  Laterality: Right;  Right reverse total shoulder replacement    Social History   Social History  . Marital Status: Married    Spouse Name: [name]  . Number of Children: 3  . Years of Education: N/A   Occupational History  . 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 Smoking status: Former Smoker -- 2.00 packs/day for 10 years    Types:  Cigarettes    Quit date: 11/26/1975  . Smokeless tobacco: Never Used     Comment: used to smoke 2 ppd  . Alcohol Use: 0.0 oz/week    0 Standard drinks or equivalent per week     Comment: rarely  . Drug Use: No  . Sexual Activity: Not on file   Other Topics Concern  . Not on file   Social History Narrative   Lives w/  [name]   2 g-child, twins on the way     Family History  Problem Relation Age of Onset  . Lung cancer Mother     smoker  . Ovarian cancer Paternal Aunt     ?  Marland Kitchen Breast cancer Neg Hx   . Rectal cancer Neg Hx   . Stomach cancer Neg Hx   . Esophageal cancer Neg Hx   . Diabetes Other     aunts-uncles   . Colon cancer Father     F dx in his 55s  . Heart attack Other     GM in her 48s  . Stroke Other      aunts-uncles        Medication List       This list is accurate as of: 12/27/14  5:51 PM.  Always use your most recent med list.               acetaminophen 500 MG tablet  Commonly known as:  TYLENOL  Take 500 mg by mouth every 6 (six) hours as needed.     ARICEPT 10 MG tablet  Generic drug:  donepezil  Take 2 tablets (20 mg total) by mouth at bedtime.     ARIPiprazole 5 MG tablet  Commonly known as:  ABILIFY  Take 2.5 mg by mouth daily before breakfast.     aspirin 81 MG tablet  Take 81 mg by mouth daily.     atenolol 50 MG tablet  Commonly known as:  TENORMIN  Take 0.5 tablets (25 mg total) by mouth daily.     atorvastatin 40 MG tablet  Commonly known as:  LIPITOR  Take 40 mg by mouth daily at 6 PM.     B-D ULTRAFINE III SHORT PEN 31G X 8 MM Misc  Generic drug:  Insulin Pen Needle  USE AS DIRECTED     DULoxetine 60 MG capsule  Commonly known as:  CYMBALTA  Take 60 mg by mouth daily before breakfast.     glucose blood test strip  Commonly known as:  ONE TOUCH ULTRA TEST  Check twice a day     LEVEMIR FLEXTOUCH 100 UNIT/ML Pen  Generic drug:  Insulin Detemir  Inject 90 Units into the skin daily.     losartan 50 MG tablet  Commonly known as:  COZAAR  TAKE 1 TABLET (50 MG TOTAL) BY MOUTH DAILY.     metFORMIN 1000 MG tablet  Commonly known as:  GLUCOPHAGE  Take 1 tablet (1,000 mg total) by mouth 2 (two) times daily with a meal.     omeprazole 20 MG capsule  Commonly known as:  PRILOSEC  Take 1 capsule (20 mg total) by mouth daily.     TRESIBA FLEXTOUCH 200 UNIT/ML Sopn  Generic drug:  Insulin Degludec  Inject 100 Units into the skin daily. Reported on 12/27/2014           Objective:   Physical Exam BP 122/76 mmHg  Pulse 75  Temp(Src) 98.1 F (36.7 C) (Oral)  Ht  5\' 7"  (1.702 m)  Wt 203 lb 2 oz (92.137 kg)  BMI 31.81 kg/m2  SpO2 97% General:   Well developed, well nourished . NAD.  HEENT:  Normocephalic . Face symmetric,  atraumatic Lungs:  CTA B Normal respiratory effort, no intercostal retractions, no accessory muscle use. Heart: RRR,  no murmur.  no pretibial edema bilaterally  Abdomen:  Not distended, soft, non-tender. No rebound or rigidity.  Skin: Not pale. Not jaundice Neurologic:  alert & oriented X3.  Speech normal, gait appropriate for age and unassisted Psych--  Cognition and judgment appear intact.  Cooperative with normal attention span and concentration.  Behavior appropriate. No anxious or depressed appearing.    Assessment & Plan:   Assessment > DM Dr. Buddy Duty HTN Hyperlipidemia Depression Dr. Clovis Pu OSA -- mild, saw Dr Gwenette Greet 2014, no CPAP GI: --IBS, colon polyps, diverticulosis, hiatal hernia --Chronic constipation likely part of her IBS syndrome  --Fatty liver >>> GI eval 2013 --iron fec anemia:  cscope 04-2014 : 2 polyps; + hemocult @ GI office 10-2014: EGD done (-), bx neg Osteopenia: T score -1.1  2009 , osteopenia again per dexa 05-2013 , rx ca-vit d B12 deficiency, IM shots RLS MSK: --DJD frozen shoulder Mild cognitive impairment  MMSE 2015 --> 28, on Aricept, sees neurology Dizziness: chronic, carotid US 2016 neg, saw cards-- not likely CV related Chest pain 09-2009, negative stress test Thyroid nodules: Incidental   by Carotid US, BX 11-2014: Atypical findings, Bethesda III, f/u  Endocrine  PLAN: HTN: Seems well-controlled Anemia: Currently under the care of GI. On iron PO Osteopenia: Recommend to continue with calcium and vitamin D Dizziness: continue to be an issue for the patient, refer to neurology Dr Everette Rank RTC 8 months

## 2014-12-27 NOTE — Assessment & Plan Note (Signed)
HTN: Seems well-controlled Anemia: Currently under the care of GI. On iron PO Osteopenia: Recommend to continue with calcium and vitamin D Dizziness: continue to be an issue for the patient, refer to neurology Dr Everette Rank RTC 8 months

## 2014-12-27 NOTE — Progress Notes (Signed)
Pre visit review using our clinic review tool, if applicable. No additional management support is needed unless otherwise documented below in the visit note. 

## 2014-12-27 NOTE — Assessment & Plan Note (Addendum)
Td-- 2014 Pneumovax 2009-2014 prevnar-- 12-2013 shingles shot 2010 Had a Flu shot    No recent visit w/  Gynecology, no h/o abnormal paps last mammogram 07-2014 , normal  + FH colon cancer ----> Colonoscopy: 04/18/2009, cscope again 04-2014: +polyps Diet-exercise discussed

## 2014-12-29 ENCOUNTER — Ambulatory Visit (INDEPENDENT_AMBULATORY_CARE_PROVIDER_SITE_OTHER): Payer: 59 | Admitting: Psychology

## 2014-12-29 DIAGNOSIS — F4323 Adjustment disorder with mixed anxiety and depressed mood: Secondary | ICD-10-CM

## 2014-12-30 ENCOUNTER — Other Ambulatory Visit (INDEPENDENT_AMBULATORY_CARE_PROVIDER_SITE_OTHER): Payer: 59

## 2014-12-30 DIAGNOSIS — D509 Iron deficiency anemia, unspecified: Secondary | ICD-10-CM | POA: Diagnosis not present

## 2014-12-30 LAB — FECAL OCCULT BLOOD, IMMUNOCHEMICAL: Fecal Occult Bld: NEGATIVE

## 2014-12-30 NOTE — Addendum Note (Signed)
Addended by: Peggyann Shoals on: 12/30/2014 03:10 PM   Modules accepted: Orders

## 2015-01-26 ENCOUNTER — Ambulatory Visit (INDEPENDENT_AMBULATORY_CARE_PROVIDER_SITE_OTHER): Payer: 59 | Admitting: Psychology

## 2015-01-26 DIAGNOSIS — F4323 Adjustment disorder with mixed anxiety and depressed mood: Secondary | ICD-10-CM

## 2015-02-13 ENCOUNTER — Ambulatory Visit (INDEPENDENT_AMBULATORY_CARE_PROVIDER_SITE_OTHER): Payer: 59 | Admitting: Psychology

## 2015-02-13 DIAGNOSIS — F4323 Adjustment disorder with mixed anxiety and depressed mood: Secondary | ICD-10-CM | POA: Diagnosis not present

## 2015-02-24 ENCOUNTER — Encounter: Payer: Self-pay | Admitting: Medical

## 2015-02-24 ENCOUNTER — Ambulatory Visit (INDEPENDENT_AMBULATORY_CARE_PROVIDER_SITE_OTHER): Payer: Medicare Other | Admitting: Medical

## 2015-02-24 VITALS — BP 130/80 | HR 64 | Temp 97.7°F | Ht 67.0 in | Wt 203.2 lb

## 2015-02-24 DIAGNOSIS — N390 Urinary tract infection, site not specified: Secondary | ICD-10-CM | POA: Diagnosis not present

## 2015-02-24 LAB — POCT URINALYSIS DIPSTICK
Bilirubin, UA: NEGATIVE
Glucose, UA: NEGATIVE
Ketones, UA: NEGATIVE
Nitrite, UA: NEGATIVE
Protein, UA: 15
Spec Grav, UA: 1.01
Urobilinogen, UA: 0.2
pH, UA: 5

## 2015-02-24 MED ORDER — PHENAZOPYRIDINE HCL 200 MG PO TABS: 200.0000 mg | ORAL_TABLET | Freq: Three times a day (TID) | ORAL | Status: DC | PRN

## 2015-02-24 MED ORDER — CIPROFLOXACIN HCL 500 MG PO TABS: 500.0000 mg | ORAL_TABLET | Freq: Two times a day (BID) | ORAL | Status: DC

## 2015-02-24 NOTE — Progress Notes (Signed)
Pre visit review using our clinic review tool, if applicable. No additional management support is needed unless otherwise documented below in the visit note. 

## 2015-02-24 NOTE — Progress Notes (Signed)
Subjective:    Patient ID: [name], female    DOB: [birth year], 70 y.o.   MRN: [id number]  HPI  Pt in today reporting urinary symptoms x 1 day.  Dysuria- yes. Burns. Frequent urination-yes Hesitancy-no Suprapubic pressure-yes Fever-no chills-no Nausea-no Vomiting-no CVA pain-no History of UTI- occasoinal uti. One within last year. Gross hematuria- none seen in urine. She thinks faint blood on toilet paper when she wiped. This is just recent.   Review of Systems  Constitutional: Negative for fever, chills and fatigue.  Respiratory: Negative for cough, shortness of breath and wheezing.   Cardiovascular: Negative for chest pain and palpitations.  Gastrointestinal: Negative for abdominal pain, diarrhea, constipation, blood in stool, abdominal distention and rectal pain.  Genitourinary: Positive for dysuria, urgency and frequency. Negative for hematuria, decreased urine volume, vaginal pain, menstrual problem and pelvic pain.  Hematological: Negative for adenopathy. Does not bruise/bleed easily.  Psychiatric/Behavioral: Negative for behavioral problems and confusion.   Past Medical History  Diagnosis Date  . Diabetes mellitus     dr Buddy Duty  . Depression     sees Dr.Cotle  . Hyperlipemia   . Hypertension   . IBS (irritable bowel syndrome)     and dyspepsia  . RLS (restless legs syndrome)     h/o- no longer using Requip  . Adenomatous colon polyp 03/1988  . Osteopenia     dexa 06/2007 and 10/11 Rx CA vitamin d  . Chest pain      Sept 2011: stress test neg  . Diverticulosis   . Fatty liver     Increased LFTs, saw GI 06/2011, likely from fatty liver   . Adenomatous colon polyp 1990  . Anemia     borderline  . Cataract     no surgery  . GERD (gastroesophageal reflux disease)   . History of hiatal hernia   . Arthritis     R shoulder - degenerative   . Memory impairment 08/2014    mild cognitive impairment vs. mild dementia, reommended reinstating of cholinesterase  inhibitor     Social History   Social History  . Marital Status: Married    Spouse Name: [name]  . Number of Children: 3  . Years of Education: N/A   Occupational History  . 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 Smoking status: Former Smoker -- 2.00 packs/day for 10 years    Types: Cigarettes    Quit date: 11/26/1975  . Smokeless tobacco: Never Used     Comment: used to smoke 2 ppd  . Alcohol Use: 0.0 oz/week    0 Standard drinks or equivalent per week     Comment: rarely  . Drug Use: No  . Sexual Activity: Not on file   Other Topics Concern  . Not on file   Social History Narrative   Lives w/  [name]   2 g-child, twins on the way    Past Surgical History  Procedure Laterality Date  . Uterine fibroid embolization      90s  . Tonsillectomy    . Polypectomy    . Colonoscopy    . Reverse shoulder arthroplasty Right 04/21/2014    Procedure: REVERSE SHOULDER ARTHROPLASTY;  Surgeon: Tania Ade, MD;  Location: [place];  Service: Orthopedics;  Laterality: Right;  Right reverse total shoulder replacement    Family History  Problem Relation Age of Onset  . Lung cancer Mother  smoker  . Ovarian cancer Paternal Aunt     ?  Marland Kitchen Breast cancer Neg Hx   . Rectal cancer Neg Hx   . Stomach cancer Neg Hx   . Esophageal cancer Neg Hx   . Diabetes Other     aunts-uncles   . Colon cancer Father     F dx in his 66s  . Heart attack Other     GM in her 46s  . Stroke Other     aunts-uncles     Allergies  Allergen Reactions  . Sulfa Antibiotics Nausea Only  . Trulicity [Dulaglutide] Nausea Only  . Penicillins Rash    Current Outpatient Prescriptions on File Prior to Visit  Medication Sig Dispense Refill  . acetaminophen (TYLENOL) 500 MG tablet Take 500 mg by mouth every 6 (six) hours as needed.    . ARIPiprazole (ABILIFY) 5 MG tablet Take 2.5 mg by mouth daily before breakfast.     . aspirin 81 MG tablet Take 81 mg by mouth  daily.    Marland Kitchen atenolol (TENORMIN) 50 MG tablet Take 0.5 tablets (25 mg total) by mouth daily. 15 tablet 6  . atorvastatin (LIPITOR) 40 MG tablet Take 40 mg by mouth daily at 6 PM.     . B-D ULTRAFINE III SHORT PEN 31G X 8 MM MISC USE AS DIRECTED 100 each 2  . donepezil (ARICEPT) 10 MG tablet Take 2 tablets (20 mg total) by mouth at bedtime.    . DULoxetine (CYMBALTA) 60 MG capsule Take 60 mg by mouth daily before breakfast.     . glucose blood (ONE TOUCH ULTRA TEST) test strip Check twice a day 200 each 3  . Insulin Degludec (TRESIBA FLEXTOUCH) 200 UNIT/ML SOPN Inject 100 Units into the skin daily. Reported on 12/27/2014    . Insulin Detemir (LEVEMIR FLEXTOUCH) 100 UNIT/ML Pen Inject 90 Units into the skin daily.    Marland Kitchen losartan (COZAAR) 50 MG tablet TAKE 1 TABLET (50 MG TOTAL) BY MOUTH DAILY. (Patient taking differently: TAKE 1 TABLET (50 MG TOTAL) BY MOUTH DAILY.  (after dinner)) 30 tablet 5  . metFORMIN (GLUCOPHAGE) 1000 MG tablet Take 1 tablet (1,000 mg total) by mouth 2 (two) times daily with a meal. 180 tablet 3  . omeprazole (PRILOSEC) 20 MG capsule Take 1 capsule (20 mg total) by mouth daily. 30 capsule 6   No current facility-administered medications on file prior to visit.    BP 130/80 mmHg  Pulse 64  Temp(Src) 97.7 F (36.5 C) (Oral)  Ht 5\' 7"  (1.702 m)  Wt 203 lb 3.2 oz (92.171 kg)  BMI 31.82 kg/m2  SpO2 97%       Objective:   Physical Exam  General Appearance- Not in acute distress.   Chest and Lung Exam Auscultation: Breath sounds:-Normal. Adventitious sounds:- No Adventitious sounds.  Cardiovascular Auscultation:Rythm - Regular. Heart Sounds -Normal heart sounds.  Abdomen Inspection:-Inspection Normal.  Palpation/Perucssion: Palpation and Percussion of the abdomen reveal- faint suprapubic Tender, No Rebound tenderness, No rigidity(Guarding) and No Palpable abdominal masses.  Liver:-Normal.  Spleen:- Normal.   Back- no cva tenderness      Assessment &  Plan:  You appear to have a urinary tract infection. I am prescribing  cipro antibiotic for the probable infection. Hydrate well. I am sending out a urine culture. During the interim if your signs and symptoms worsen rather than improving please notify us. We will notify your when the culture results are back.  Pyridium for urinary pain.  Follow up in 7 days or as needed.

## 2015-02-24 NOTE — Patient Instructions (Addendum)
You appear to have a urinary tract infection. I am prescribing  cipro antibiotic for the probable infection. Hydrate well. I am sending out a urine culture. During the interim if your signs and symptoms worsen rather than improving please notify us. We will notify your when the culture results are back.  Pyridium for urinary pain.  Follow up in 7 days or as needed.

## 2015-02-27 LAB — URINE CULTURE: Colony Count: 25000

## 2015-02-27 NOTE — Progress Notes (Signed)
Quick Note:  Pt has seen results on MyChart and message also sent for patient to call back if any questions. ______ 

## 2015-03-01 ENCOUNTER — Other Ambulatory Visit: Payer: Self-pay | Admitting: Internal Medicine

## 2015-03-02 ENCOUNTER — Ambulatory Visit: Payer: 59 | Admitting: Psychology

## 2015-03-06 ENCOUNTER — Ambulatory Visit: Payer: Self-pay | Admitting: Medical

## 2015-03-07 ENCOUNTER — Ambulatory Visit (INDEPENDENT_AMBULATORY_CARE_PROVIDER_SITE_OTHER): Payer: Medicare Other | Admitting: Internal Medicine

## 2015-03-07 ENCOUNTER — Encounter: Payer: Self-pay | Admitting: Internal Medicine

## 2015-03-07 VITALS — BP 126/74 | HR 76 | Temp 98.2°F | Ht 67.0 in | Wt 200.2 lb

## 2015-03-07 DIAGNOSIS — Z09 Encounter for follow-up examination after completed treatment for conditions other than malignant neoplasm: Secondary | ICD-10-CM

## 2015-03-07 DIAGNOSIS — E538 Deficiency of other specified B group vitamins: Secondary | ICD-10-CM | POA: Diagnosis not present

## 2015-03-07 DIAGNOSIS — R5383 Other fatigue: Secondary | ICD-10-CM

## 2015-03-07 DIAGNOSIS — R251 Tremor, unspecified: Secondary | ICD-10-CM | POA: Diagnosis not present

## 2015-03-07 MED ORDER — CYANOCOBALAMIN 1000 MCG/ML IJ SOLN
1000.0000 ug | Freq: Once | INTRAMUSCULAR | Status: AC
  Administered 2015-03-07: 1000 ug via INTRAMUSCULAR

## 2015-03-07 NOTE — Progress Notes (Signed)
Subjective:    Patient ID: [name], female    DOB: April 12, 1945, 70 y.o.   MRN: [id number]  DOS:  03/07/2015 Type of visit - description : Acute visit Interval history: Her chief complaint is tremors, noted about a year ago, is worse on the right hand, usually when she is trying to eat or write.  So far symptoms have not interfered with her ADLs. Denies headaches. Recently was seen with a UTI, currently asymptomatic Also long h/o fatigue, lack of energy, she needs to take some naps some days. Continue snoring. >> Husband thinks is "boredom",   Review of Systems No fever chills No recent chest pain, shortness of breath, edema or DOE. Depression is well controlled No dysuria or gross hematuria at this time  Past Medical History  Diagnosis Date  . Diabetes mellitus     dr Buddy Duty  . Depression     sees Dr.Cotle  . Hyperlipemia   . Hypertension   . IBS (irritable bowel syndrome)     and dyspepsia  . RLS (restless legs syndrome)     h/o- no longer using Requip  . Adenomatous colon polyp 03/1988  . Osteopenia     dexa 06/2007 and 10/11 Rx CA vitamin d  . Chest pain      Sept 2011: stress test neg  . Diverticulosis   . Fatty liver     Increased LFTs, saw GI 06/2011, likely from fatty liver   . Adenomatous colon polyp 1990  . Anemia     borderline  . Cataract     no surgery  . GERD (gastroesophageal reflux disease)   . History of hiatal hernia   . Arthritis     R shoulder - degenerative   . Memory impairment 08/2014    mild cognitive impairment vs. mild dementia, reommended reinstating of cholinesterase inhibitor     Past Surgical History  Procedure Laterality Date  . Uterine fibroid embolization      90s  . Tonsillectomy    . Polypectomy    . Colonoscopy    . Reverse shoulder arthroplasty Right 04/21/2014    Procedure: REVERSE SHOULDER ARTHROPLASTY;  Surgeon: Tania Ade, MD;  Location: [place];  Service: Orthopedics;  Laterality: Right;  Right reverse total  shoulder replacement    Social History   Social History  . Marital Status: Married    Spouse Name: [name]  . Number of Children: 3  . Years of Education: N/A   Occupational History  . 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 Smoking status: Former Smoker -- 2.00 packs/day for 10 years    Types: Cigarettes    Quit date: 11/26/1975  . Smokeless tobacco: Never Used     Comment: used to smoke 2 ppd  . Alcohol Use: 0.0 oz/week    0 Standard drinks or equivalent per week     Comment: rarely  . Drug Use: No  . Sexual Activity: Not on file   Other Topics Concern  . Not on file   Social History Narrative   Lives w/  [name]   2 g-child, twins on the way        Medication List       This list is accurate as of: 03/07/15 11:59 PM.  Always use your most recent med list.               acetaminophen 500 MG tablet  Commonly known as:  TYLENOL  Take 500 mg by mouth every 6 (six) hours as needed.     ARICEPT 10 MG tablet  Generic drug:  donepezil  Take 2 tablets (20 mg total) by mouth at bedtime.     ARIPiprazole 5 MG tablet  Commonly known as:  ABILIFY  Take 2.5 mg by mouth daily before breakfast.     aspirin 81 MG tablet  Take 81 mg by mouth daily.     atenolol 50 MG tablet  Commonly known as:  TENORMIN  Take 0.5 tablets (25 mg total) by mouth daily.     atorvastatin 40 MG tablet  Commonly known as:  LIPITOR  Take 1 tablet (40 mg total) by mouth daily.     B-D ULTRAFINE III SHORT PEN 31G X 8 MM Misc  Generic drug:  Insulin Pen Needle  USE AS DIRECTED     DULoxetine 60 MG capsule  Commonly known as:  CYMBALTA  Take 60 mg by mouth daily before breakfast.     glucose blood test strip  Commonly known as:  ONE TOUCH ULTRA TEST  Check twice a day     LEVEMIR FLEXTOUCH 100 UNIT/ML Pen  Generic drug:  Insulin Detemir  Inject 100 Units into the skin daily.     losartan 50 MG tablet  Commonly known as:  COZAAR  TAKE 1  TABLET (50 MG TOTAL) BY MOUTH DAILY.     metFORMIN 1000 MG tablet  Commonly known as:  GLUCOPHAGE  Take 1 tablet (1,000 mg total) by mouth 2 (two) times daily with a meal.     omeprazole 20 MG capsule  Commonly known as:  PRILOSEC  Take 1 capsule (20 mg total) by mouth daily.     TRESIBA FLEXTOUCH 200 UNIT/ML Sopn  Generic drug:  Insulin Degludec  Inject 100 Units into the skin daily. Reported on 03/07/2015           Objective:   Physical Exam BP 126/74 mmHg  Pulse 76  Temp(Src) 98.2 F (36.8 C) (Oral)  Ht 5\' 7"  (1.702 m)  Wt 200 lb 4 oz (90.833 kg)  BMI 31.36 kg/m2  SpO2 98% General:   Well developed, well nourished . NAD.  HEENT:  Normocephalic . Face symmetric, atraumatic Lungs:  CTA B Normal respiratory effort, no intercostal retractions, no accessory muscle use. Heart: RRR,  no murmur.  No pretibial edema bilaterally  Skin: Not pale. Not jaundice Neurologic:  alert & oriented X3.  Speech normal, gait appropriate for age and unassisted. Strength symmetric, mild action tremor worse on the right hand. Psych--  Cognition and judgment appear intact.  Cooperative with normal attention span and concentration.  Behavior appropriate. No anxious or depressed appearing.      Assessment & Plan:   Assessment > DM Dr. Buddy Duty HTN Hyperlipidemia Depression Dr. Clovis Pu OSA -- mild, saw Dr Gwenette Greet 2014, no CPAP GI: --IBS, colon polyps, diverticulosis, hiatal hernia --Chronic constipation likely part of her IBS syndrome  --Fatty liver >>> GI eval 2013 --iron fec anemia:  cscope 04-2014 : 2 polyps; + hemocult @ GI office 10-2014: EGD done (-), bx neg Osteopenia: T score -1.1  2009 , osteopenia again per dexa 05-2013 , rx ca-vit d B12 deficiency  RLS MSK: --DJD frozen shoulder Mild cognitive impairment  MMSE 2015 --> 28, on Aricept, last visit w/ neuro, Dr Everette Rank 02-2015  Dizziness: chronic, carotid US 2016 neg, saw cards-- not likely CV related; saw neuro DR Everette Rank 02-2015  Chest pain 09-2009, negative stress test Thyroid nodules: Incidental   by Carotid US, BX 11-2014: Atypical findings, Bethesda III, f/u  Endocrine  PLAN: Tremor - mild action tremor x 1 year, likely essential tremor. For now recommend observation, if symptoms get worse she will let me know Dizziness: Saw neurology 02-2015, currently doing okay UTI: E  coli on urine culture, status post antibiotics, now asymptomatic. Fatigue: Likely multifactorial, mild OSA, not using CPAP; chart reviewed most recent BMP, hemoglobin, iron and TSH okay. DM needs better control.  On further questioning she is not taking B12 supplements as recommended. rx observation for now, see below B12 deficiency: Has not been taking B12 supplements in a while,  check labs, IM B12 today F/u 08-2015   Today, I spent more than   25 min with the patient: >50% of the time counseling regards new onset of tremor, reviewing the chart and labs in reference to fatigue.

## 2015-03-07 NOTE — Patient Instructions (Signed)
Please let me know if the tremors get worse  I will see you in August as scheduled, call sooner if needed

## 2015-03-07 NOTE — Progress Notes (Signed)
Pre visit review using our clinic review tool, if applicable. No additional management support is needed unless otherwise documented below in the visit note. 

## 2015-03-08 ENCOUNTER — Encounter: Payer: Self-pay | Admitting: Medical

## 2015-03-08 LAB — VITAMIN B12: Vitamin B-12: 1250 pg/mL — ABNORMAL HIGH (ref 211–911)

## 2015-03-08 NOTE — Assessment & Plan Note (Signed)
Tremor - mild action tremor x 1 year, likely essential tremor. For now recommend observation, if symptoms get worse she will let me know Dizziness: Saw neurology 02-2015, currently doing okay UTI: E  coli on urine culture, status post antibiotics, now asymptomatic. Fatigue: Likely multifactorial, mild OSA, not using CPAP; chart reviewed most recent BMP, hemoglobin, iron and TSH okay. DM needs better control.  On further questioning she is not taking B12 supplements as recommended. rx observation for now, see below B12 deficiency: Has not been taking B12 supplements in a while,  check labs, IM B12 today F/u 08-2015

## 2015-03-09 ENCOUNTER — Ambulatory Visit (INDEPENDENT_AMBULATORY_CARE_PROVIDER_SITE_OTHER): Payer: 59 | Admitting: Psychology

## 2015-03-09 DIAGNOSIS — F4323 Adjustment disorder with mixed anxiety and depressed mood: Secondary | ICD-10-CM | POA: Diagnosis not present

## 2015-03-10 ENCOUNTER — Other Ambulatory Visit (INDEPENDENT_AMBULATORY_CARE_PROVIDER_SITE_OTHER): Payer: Medicare Other

## 2015-03-10 ENCOUNTER — Other Ambulatory Visit: Payer: Self-pay

## 2015-03-10 DIAGNOSIS — R300 Dysuria: Secondary | ICD-10-CM | POA: Diagnosis not present

## 2015-03-10 LAB — POC URINALSYSI DIPSTICK (AUTOMATED)
Bilirubin, UA: NEGATIVE
Blood, UA: NEGATIVE
Glucose, UA: NEGATIVE
Ketones, UA: NEGATIVE
Leukocytes, UA: NEGATIVE
Nitrite, UA: NEGATIVE
Protein, UA: NEGATIVE
Spec Grav, UA: 1.01
Urobilinogen, UA: 0.2
pH, UA: 6

## 2015-03-14 ENCOUNTER — Ambulatory Visit: Payer: 59 | Admitting: Psychology

## 2015-03-16 LAB — HEMOGLOBIN A1C: Hemoglobin A1C: 7.2

## 2015-03-16 LAB — TSH: TSH: 0.56 u[IU]/mL (ref 0.41–5.90)

## 2015-03-17 ENCOUNTER — Encounter: Payer: Self-pay | Admitting: Internal Medicine

## 2015-03-21 ENCOUNTER — Ambulatory Visit: Payer: 59 | Admitting: Psychology

## 2015-03-27 ENCOUNTER — Ambulatory Visit (INDEPENDENT_AMBULATORY_CARE_PROVIDER_SITE_OTHER): Payer: 59 | Admitting: Psychology

## 2015-03-27 DIAGNOSIS — F4323 Adjustment disorder with mixed anxiety and depressed mood: Secondary | ICD-10-CM

## 2015-04-10 ENCOUNTER — Ambulatory Visit (INDEPENDENT_AMBULATORY_CARE_PROVIDER_SITE_OTHER): Payer: 59 | Admitting: Psychology

## 2015-04-10 DIAGNOSIS — F4323 Adjustment disorder with mixed anxiety and depressed mood: Secondary | ICD-10-CM | POA: Diagnosis not present

## 2015-04-16 ENCOUNTER — Encounter: Payer: Self-pay | Admitting: Internal Medicine

## 2015-04-25 ENCOUNTER — Other Ambulatory Visit: Payer: Self-pay | Admitting: Internal Medicine

## 2015-04-27 ENCOUNTER — Ambulatory Visit (INDEPENDENT_AMBULATORY_CARE_PROVIDER_SITE_OTHER): Payer: 59 | Admitting: Psychology

## 2015-04-27 ENCOUNTER — Other Ambulatory Visit: Payer: Self-pay

## 2015-04-27 DIAGNOSIS — F331 Major depressive disorder, recurrent, moderate: Secondary | ICD-10-CM

## 2015-04-27 MED ORDER — ATENOLOL 50 MG PO TABS: 25.0000 mg | ORAL_TABLET | Freq: Every day | ORAL | Status: DC

## 2015-05-05 ENCOUNTER — Other Ambulatory Visit: Payer: Self-pay

## 2015-05-05 MED ORDER — OMEPRAZOLE 20 MG PO CPDR: 20.0000 mg | DELAYED_RELEASE_CAPSULE | Freq: Every day | ORAL | Status: DC

## 2015-05-05 MED ORDER — ATENOLOL 50 MG PO TABS: 25.0000 mg | ORAL_TABLET | Freq: Every day | ORAL | Status: DC

## 2015-05-11 ENCOUNTER — Ambulatory Visit (INDEPENDENT_AMBULATORY_CARE_PROVIDER_SITE_OTHER): Payer: 59 | Admitting: Psychology

## 2015-05-11 DIAGNOSIS — F331 Major depressive disorder, recurrent, moderate: Secondary | ICD-10-CM

## 2015-05-25 ENCOUNTER — Ambulatory Visit (INDEPENDENT_AMBULATORY_CARE_PROVIDER_SITE_OTHER): Payer: 59 | Admitting: Psychology

## 2015-05-25 DIAGNOSIS — F331 Major depressive disorder, recurrent, moderate: Secondary | ICD-10-CM | POA: Diagnosis not present

## 2015-06-15 ENCOUNTER — Ambulatory Visit: Payer: 59 | Admitting: Psychology

## 2015-07-28 ENCOUNTER — Telehealth: Payer: Self-pay

## 2015-07-28 NOTE — Telephone Encounter (Signed)
Patient is on the list for [employer] 2017 and may be a good candidate for an AWV in 2017. Please let me know if/when appt is scheduled.   

## 2015-07-28 NOTE — Telephone Encounter (Signed)
LVM advising patient of message below °

## 2015-07-31 ENCOUNTER — Telehealth: Payer: Self-pay | Admitting: *Deleted

## 2015-07-31 NOTE — Telephone Encounter (Signed)
[facility] note received via fax  Call:   Date: 07/29/15 Time: 22   Caller: Self Return number: [phone]  Nurse: Georga Bora, RN  Chief Complaint: Vomiting  Reason for call: Caller states she is having a GI upset, she threw up last night and is still nauseated this morning.  Related visit to physician within the last 2 weeks: No  Guideline: Vomiting; MILD vomiting with diarrhea  Disposition: Home Care; Called in rx for Zofran from [facility] to requested pharmacy

## 2015-08-01 ENCOUNTER — Ambulatory Visit: Payer: Self-pay | Admitting: Family

## 2015-08-02 ENCOUNTER — Telehealth: Payer: Self-pay | Admitting: Internal Medicine

## 2015-08-02 ENCOUNTER — Ambulatory Visit (INDEPENDENT_AMBULATORY_CARE_PROVIDER_SITE_OTHER): Payer: Medicare Other | Admitting: Family Medicine

## 2015-08-02 ENCOUNTER — Encounter: Payer: Self-pay | Admitting: Family Medicine

## 2015-08-02 VITALS — BP 136/68 | HR 71 | Temp 98.1°F | Ht 67.0 in | Wt 195.2 lb

## 2015-08-02 DIAGNOSIS — R112 Nausea with vomiting, unspecified: Secondary | ICD-10-CM

## 2015-08-02 DIAGNOSIS — IMO0001 Reserved for inherently not codable concepts without codable children: Secondary | ICD-10-CM

## 2015-08-02 DIAGNOSIS — K30 Functional dyspepsia: Secondary | ICD-10-CM

## 2015-08-02 DIAGNOSIS — E119 Type 2 diabetes mellitus without complications: Secondary | ICD-10-CM | POA: Diagnosis not present

## 2015-08-02 DIAGNOSIS — Z794 Long term (current) use of insulin: Secondary | ICD-10-CM | POA: Diagnosis not present

## 2015-08-02 LAB — COMPREHENSIVE METABOLIC PANEL
ALT: 19 U/L (ref 0–35)
AST: 17 U/L (ref 0–37)
Albumin: 4.4 g/dL (ref 3.5–5.2)
Alkaline Phosphatase: 61 U/L (ref 39–117)
BUN: 7 mg/dL (ref 6–23)
CO2: 26 mEq/L (ref 19–32)
Calcium: 9.7 mg/dL (ref 8.4–10.5)
Chloride: 101 mEq/L (ref 96–112)
Creatinine, Ser: 0.69 mg/dL (ref 0.40–1.20)
GFR: 89.28 mL/min (ref >60.00)
Glucose, Bld: 144 mg/dL — ABNORMAL HIGH (ref 70–99)
Potassium: 3.4 mEq/L — ABNORMAL LOW (ref 3.5–5.1)
Sodium: 136 mEq/L (ref 135–145)
Total Bilirubin: 0.6 mg/dL (ref 0.2–1.2)
Total Protein: 7.8 g/dL (ref 6.0–8.3)

## 2015-08-02 LAB — CBC WITH DIFFERENTIAL/PLATELET
Basophils Absolute: 0 10*3/uL (ref 0.0–0.1)
Basophils Relative: 0.5 % (ref 0.0–3.0)
Eosinophils Absolute: 0.4 10*3/uL (ref 0.0–0.7)
Eosinophils Relative: 4.2 % (ref 0.0–5.0)
HCT: 38.3 % (ref 36.0–46.0)
Hemoglobin: 12.9 g/dL (ref 12.0–15.0)
Lymphocytes Relative: 38 % (ref 12.0–46.0)
Lymphs Abs: 3.3 10*3/uL (ref 0.7–4.0)
MCHC: 33.7 g/dL (ref 30.0–36.0)
MCV: 83 fl (ref 78.0–100.0)
Monocytes Absolute: 0.3 10*3/uL (ref 0.1–1.0)
Monocytes Relative: 3.9 % (ref 3.0–12.0)
Neutro Abs: 4.7 10*3/uL (ref 1.4–7.7)
Neutrophils Relative %: 53.4 % (ref 43.0–77.0)
Platelets: 197 10*3/uL (ref 150.0–400.0)
RBC: 4.62 Mil/uL (ref 3.87–5.11)
RDW: 14.2 % (ref 11.5–15.5)
WBC: 8.8 10*3/uL (ref 4.0–10.5)

## 2015-08-02 LAB — POCT URINALYSIS DIP (MANUAL ENTRY)
Bilirubin, UA: NEGATIVE
Blood, UA: NEGATIVE
Glucose, UA: NEGATIVE
Ketones, POC UA: NEGATIVE
Leukocytes, UA: NEGATIVE
Nitrite, UA: NEGATIVE
Protein Ur, POC: NEGATIVE
Spec Grav, UA: 1.02
Urobilinogen, UA: NEGATIVE
pH, UA: 6

## 2015-08-02 LAB — LIPASE: Lipase: 19 U/L (ref 11.0–59.0)

## 2015-08-02 LAB — HM DIABETES FOOT EXAM

## 2015-08-02 LAB — TSH: TSH: 0.56 u[IU]/mL (ref <=5.90)

## 2015-08-02 LAB — HEMOGLOBIN A1C: Hgb A1c MFr Bld: 6.2 % (ref 4.6–6.5)

## 2015-08-02 MED ORDER — ONDANSETRON HCL 4 MG PO TABS: 4.0000 mg | ORAL_TABLET | Freq: Three times a day (TID) | ORAL | 0 refills | Status: DC | PRN

## 2015-08-02 NOTE — Patient Instructions (Signed)
It was good to see you today!  We will get labs and look for any clues as to the cause of your symptoms. I will also check your A1c.  If your white blood cell count is high, or if other labs are concerning we may need to get a CT scan of your belly Otherwise please continue to closely observe your symptoms. If you have any pain that is persistent please seek care with [place] or the ER promptly

## 2015-08-02 NOTE — Telephone Encounter (Signed)
Pt experiencing some nausea since visit earlier today.

## 2015-08-02 NOTE — Progress Notes (Signed)
[facility] [address] [place], [place] [address] [phone]  Date:  08/02/2015   Name:  [name]   DOB:  [birth year]   MRN:  [id number]  PCP:  Kathlene November, MD    Chief Complaint: Nausea (c/o had one episode of vomiting on 07/28/15. Blood sugar dropped to 71, after eating blood sugar came up. Rx was called in for Zofran which helped. Sx's have been going on x 2 weeks. )   History of Present Illness:  [name] is a 70 y.o. very pleasant female patient who presents with the following:  History of IDDM which has been under ok control recently Lab Results  Component Value Date   HGBA1C 7.2 03/16/2015   On 7/21 she had an episode of vomiting.  She threw up suddenly, did not feel bad or nauseated prior to vomiting. Later on that evening she felt weak, checked her sugar and it was 71.  She ate a snack and her glucose came up to 120.  The next day she was still nauseated, called in and was given some zofran to use prn On 7/24 she noted that she was still feeling nauseated, stomach felt bloated, "full and tight," she was able to eat ok however.   She is now feeling better, "maybe just a tinge of nausea."  No further vomiting. No diarrhea- she has felt that her stool was yellow/ brown in appearance Appetite is good, no belly pain noted No other low glucose readings She has a history of a hiatal hernia,but cannot recall any other particular history of stomach problems.  She does get a colonoscopy every approx 5 years for polyps, per Dr. Fuller Plan. Never had any colon cancer herselfbut her father did.    No urinary sx, no fever or chills. She last used her zofran last night (she thinks, cannot quite remember) No travel, diet changes or suspicoius foods, no ill contacts.   No recent med change  Patient Active Problem List   Diagnosis Date Noted  . PCP Notes >>>>>>>>>>>>>>>>> 09/21/2014  . Otitis media 05/23/2014  .  UTI (urinary tract infection) 04/29/2014  . Rotator cuff tear arthropathy 04/21/2014  . Stool color abnormal 02/11/2014  . Abnormal MRI, shoulder 12/01/2013  . 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 Cervical radiculitis 11/10/2013  . 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 Bursitis of right shoulder 08/19/2013  . Primary localized osteoarthrosis, lower leg 08/19/2013  . Vitamin B 12 deficiency 05/20/2013  . Counseling about travel 05/20/2013  . Cough 06/30/2012  . OSA (obstructive sleep apnea) 02/13/2012  . Pulmonary nodule, RUL per CT 01-2012 01/28/2012  . Wrist pain 11/28/2011  . Memory deficit   05/29/2011  . Fatigue 02/27/2011  . Fatty liver 02/27/2011  . Annual physical exam 11/26/2010  . Osteopenia 10/09/2007  . COLONIC POLYPS 08/22/2006  . DYSPEPSIA 08/22/2006  . DM II (diabetes mellitus, type II), controlled ([place]) 05/19/2006  . Hyperlipidemia 05/19/2006  . DEPRESSION 05/19/2006  . SYNDROME, RESTLESS LEGS 05/19/2006  . Essential hypertension 05/19/2006  . IBS -- constipation 05/19/2006    Past Medical History:  Diagnosis Date  . Adenomatous colon polyp 03/1988  . Adenomatous colon polyp 1990  . Anemia    borderline  . Arthritis    R shoulder - degenerative   . Cataract    beginning stages  . Chest pain     Sept 2011: stress test neg  . Depression  sees Dr.Cotle  . Diabetes mellitus    dr Buddy Duty  . Diverticulosis   . Fatty liver    Increased LFTs, saw GI 06/2011, likely from fatty liver   . GERD (gastroesophageal reflux disease)   . History of hiatal hernia   . Hyperlipemia   . Hypertension   . IBS (irritable bowel syndrome)    and dyspepsia  . Memory impairment 08/2014   mild cognitive impairment vs. mild dementia, reommended reinstating of cholinesterase inhibitor   . Osteopenia    dexa 06/2007 and 10/11 Rx CA vitamin d  . RLS (restless legs syndrome)    h/o- no longer using Requip    Past Surgical History:  Procedure Laterality Date  . POLYPECTOMY     . REVERSE SHOULDER ARTHROPLASTY Right 04/21/2014   Procedure: REVERSE SHOULDER ARTHROPLASTY;  Surgeon: Tania Ade, MD;  Location: [facility];  Service: Orthopedics;  Laterality: Right;  Right reverse total shoulder replacement  . TONSILLECTOMY    . UTERINE FIBROID EMBOLIZATION     90s    Social History  Substance Use Topics  . Smoking status: Former Smoker    Packs/day: 2.00    Years: 10.00    Types: Cigarettes    Quit date: 11/26/1975  . Smokeless tobacco: Never Used     Comment: used to smoke 2 ppd  . Alcohol use 0.0 oz/week     Comment: rarely    Family History  Problem Relation Age of Onset  . Lung cancer Mother     smoker  . Ovarian cancer Paternal Aunt     ?  Marland Kitchen Breast cancer Neg Hx   . Rectal cancer Neg Hx   . Stomach cancer Neg Hx   . Esophageal cancer Neg Hx   . Diabetes Other     aunts-uncles   . Colon cancer Father     F dx in his 49s  . Heart attack Other     GM in her 57s  . Stroke Other     aunts-uncles     Allergies  Allergen Reactions  . Sulfa Antibiotics Nausea Only  . Trulicity [Dulaglutide] Nausea Only  . Penicillins Rash    Medication list has been reviewed and updated.  Current Outpatient Prescriptions on File Prior to Visit  Medication Sig Dispense Refill  . acetaminophen (TYLENOL) 500 MG tablet Take 500 mg by mouth every 6 (six) hours as needed.    . ARIPiprazole (ABILIFY) 5 MG tablet Take 2.5 mg by mouth daily before breakfast.     . aspirin 81 MG tablet Take 81 mg by mouth daily.    Marland Kitchen atenolol (TENORMIN) 50 MG tablet Take 0.5 tablets (25 mg total) by mouth daily. 45 tablet 1  . atorvastatin (LIPITOR) 40 MG tablet Take 1 tablet (40 mg total) by mouth daily. 90 tablet 2  . B-D ULTRAFINE III SHORT PEN 31G X 8 MM MISC USE AS DIRECTED (Patient not taking: Reported on 03/07/2015) 100 each 2  . donepezil (ARICEPT) 10 MG tablet Take 2 tablets (20 mg total) by mouth at bedtime.    . DULoxetine (CYMBALTA) 60 MG capsule Take 60 mg by mouth daily  before breakfast.     . glucose blood (ONE TOUCH ULTRA TEST) test strip Check twice a day (Patient not taking: Reported on 03/07/2015) 200 each 3  . Insulin Degludec (TRESIBA FLEXTOUCH) 200 UNIT/ML SOPN Inject 100 Units into the skin daily. Reported on 03/07/2015    . Insulin Detemir (LEVEMIR FLEXTOUCH) 100 UNIT/ML Pen  Inject 100 Units into the skin daily.     Marland Kitchen losartan (COZAAR) 50 MG tablet TAKE 1 TABLET (50 MG TOTAL) BY MOUTH DAILY. (Patient taking differently: TAKE 1 TABLET (50 MG TOTAL) BY MOUTH DAILY.  (after dinner)) 30 tablet 5  . metFORMIN (GLUCOPHAGE) 1000 MG tablet Take 1 tablet (1,000 mg total) by mouth 2 (two) times daily with a meal. 180 tablet 3  . omeprazole (PRILOSEC) 20 MG capsule Take 1 capsule (20 mg total) by mouth daily. 30 capsule 6  . ondansetron (ZOFRAN) 4 MG tablet Take 4-8 mg by mouth every 8 (eight) hours as needed for nausea or vomiting.     No current facility-administered medications on file prior to visit.     Review of Systems:  As per HPI- otherwise negative.   Physical Examination: Vitals:   08/02/15 0915  BP: 136/68  Pulse: 71  Temp: 98.1 F (36.7 C)   Vitals:   08/02/15 0915  Weight: 195 lb 3.2 oz (88.5 kg)  Height: 5\' 7"  (1.702 m)   Body mass index is 30.57 kg/m. Ideal Body Weight: Weight in (lb) to have BMI = 25: 159.3  GEN: WDWN, NAD, Non-toxic, A & O x 3, obese, looks well HEENT: Atraumatic, Normocephalic. Neck supple. No masses, No LAD. Ears and Nose: No external deformity. CV: RRR, No M/G/R. No JVD. No thrill. No extra heart sounds. PULM: CTA B, no wheezes, crackles, rhonchi. No retractions. No resp. distress. No accessory muscle use. ABD: S,  ND, +BS. No rebound. No HSM.  She notes minimal TTP in the RLQ and also LLQ.  See below also EXTR: No c/c/e NEURO Normal gait.  PSYCH: Normally interactive. Conversant. Not depressed or anxious appearing.  Calm demeanor.  Negative table jar, no guarding or rebound    Assessment and  Plan: Stomach upset - Plan: POCT urinalysis dipstick, Urine culture, Comprehensive metabolic panel, CBC with Differential/Platelet, Lipase  Non-intractable vomiting with nausea, unspecified vomiting type - Plan: Comprehensive metabolic panel, CBC with Differential/Platelet, Lipase  Diabetes mellitus, insulin dependent (IDDM), controlled ([place]) - Plan: Hemoglobin A1c  Here today with nausea, one episode of vomiting a few days ago. She feels like she has overall noted these sx for 1-2 weeks.  She had not noted any abd pain but had mild tenderness on exam as above.  Suspect a benign etiology such as mild gastroenteritis, but will get labs today to look for any other clues.    Signed Lamar Blinks, MD  Received the rest of her labs and called her at 1pm.  A1c looks great, increase dietary K.   Labs do not suggest any infection or other particular cause of her sx.  Will check on her tomorrow.  If any severe or persistent pain in the meantime she will seek care   Results for orders placed or performed in visit on 08/02/15  Comprehensive metabolic panel  Result Value Ref Range   Sodium 136 135 - 145 mEq/L   Potassium 3.4 (L) 3.5 - 5.1 mEq/L   Chloride 101 96 - 112 mEq/L   CO2 26 19 - 32 mEq/L   Glucose, Bld 144 (H) 70 - 99 mg/dL   BUN 7 6 - 23 mg/dL   Creatinine, Ser 0.69 0.40 - 1.20 mg/dL   Total Bilirubin 0.6 0.2 - 1.2 mg/dL   Alkaline Phosphatase 61 39 - 117 U/L   AST 17 0 - 37 U/L   ALT 19 0 - 35 U/L   Total Protein 7.8 6.0 -  8.3 g/dL   Albumin 4.4 3.5 - 5.2 g/dL   Calcium 9.7 8.4 - 10.5 mg/dL   GFR 89.28 >60.00 mL/min  CBC with Differential/Platelet  Result Value Ref Range   WBC 8.8 4.0 - 10.5 K/uL   RBC 4.62 3.87 - 5.11 Mil/uL   Hemoglobin 12.9 12.0 - 15.0 g/dL   HCT 38.3 36.0 - 46.0 %   MCV 83.0 78.0 - 100.0 fl   MCHC 33.7 30.0 - 36.0 g/dL   RDW 14.2 11.5 - 15.5 %   Platelets 197.0 150.0 - 400.0 K/uL   Neutrophils Relative % 53.4 43.0 - 77.0 %   Lymphocytes Relative 38.0  12.0 - 46.0 %   Monocytes Relative 3.9 3.0 - 12.0 %   Eosinophils Relative 4.2 0.0 - 5.0 %   Basophils Relative 0.5 0.0 - 3.0 %   Neutro Abs 4.7 1.4 - 7.7 K/uL   Lymphs Abs 3.3 0.7 - 4.0 K/uL   Monocytes Absolute 0.3 0.1 - 1.0 K/uL   Eosinophils Absolute 0.4 0.0 - 0.7 K/uL   Basophils Absolute 0.0 0.0 - 0.1 K/uL  Lipase  Result Value Ref Range   Lipase 19.0 11.0 - 59.0 U/L  Hemoglobin A1c  Result Value Ref Range   Hgb A1c MFr Bld 6.2 4.6 - 6.5 %  POCT urinalysis dipstick  Result Value Ref Range   Color, UA yellow yellow   Clarity, UA clear clear   Glucose, UA negative negative   Bilirubin, UA negative negative   Ketones, POC UA negative negative   Spec Grav, UA 1.020    Blood, UA negative negative   pH, UA 6.0    Protein Ur, POC negative negative   Urobilinogen, UA negative    Nitrite, UA Negative Negative   Leukocytes, UA Negative Negative

## 2015-08-02 NOTE — Telephone Encounter (Signed)
Called her- she had noted some nausea earlier today but resolved with zofran. No abd pain.  Will refill her zofran as she is nearly out and check on her tomorrow

## 2015-08-02 NOTE — Progress Notes (Signed)
Pre visit review using our clinic review tool, if applicable. No additional management support is needed unless otherwise documented below in the visit note. 

## 2015-08-02 NOTE — Telephone Encounter (Signed)
Pt called in because she says that she seen Dr. Lorelei Pont this morning. She want to update her and let her know that she is now experiencing some nausea. Pt says that she has Zofran and just took one a few moments ago.   She would like to be advised.   CB: [phone]

## 2015-08-03 ENCOUNTER — Telehealth: Payer: Self-pay | Admitting: Family Medicine

## 2015-08-03 NOTE — Telephone Encounter (Signed)
Called to check on her- she is feeling well, no nausea or abd pain.  She will let us know if her sx return

## 2015-08-04 LAB — URINE CULTURE: Organism ID, Bacteria: NO GROWTH

## 2015-08-07 ENCOUNTER — Encounter: Payer: Self-pay | Admitting: Family

## 2015-08-07 ENCOUNTER — Ambulatory Visit (INDEPENDENT_AMBULATORY_CARE_PROVIDER_SITE_OTHER): Payer: Medicare Other | Admitting: Family

## 2015-08-07 VITALS — BP 128/82 | HR 55 | Temp 98.2°F | Resp 16 | Ht 67.0 in | Wt 194.0 lb

## 2015-08-07 DIAGNOSIS — R112 Nausea with vomiting, unspecified: Secondary | ICD-10-CM | POA: Diagnosis not present

## 2015-08-07 MED ORDER — ONDANSETRON HCL 4 MG PO TABS: 4.0000 mg | ORAL_TABLET | Freq: Three times a day (TID) | ORAL | 0 refills | Status: DC | PRN

## 2015-08-07 MED ORDER — OMEPRAZOLE 40 MG PO CPDR: 40.0000 mg | DELAYED_RELEASE_CAPSULE | Freq: Every day | ORAL | 3 refills | Status: DC

## 2015-08-07 NOTE — Progress Notes (Signed)
Subjective:    Patient ID: [name], female    DOB: 08-20-1945, 70 y.o.   MRN: [id number]  HPI [name] is a 70 yr old female who presents today with Reports a few weeks ago she had vomiting 3 weeks ago x 1. Had nausea the rest of that day and into the next day. Did see [name] on 7/26  and had unremarkable lab work at that time.   Has had intermittent nausea off/on since then. Reports that buspar was started 1 month ago by her psychiatrist.   Today she reports feeling "kind of queezy."  Reports that she has had some mildly loose/yellow stools for the last few days. She denies abdominal pain, fever, or sick contacts.  Denies hematemesis.  She denies black/bloody stool. Reports very rare use of aleve.  She notes that zofran helps.    Also reports intermittent bouts of dizziness which she has addressed with her neurologist.  Per daughter orthostatics were normal.  Denies current dizziness.   Review of Systems    see HPI  Past Medical History:  Diagnosis Date  . Adenomatous colon polyp 03/1988  . Adenomatous colon polyp 1990  . Anemia    borderline  . Arthritis    R shoulder - degenerative   . Cataract    beginning stages  . Chest pain     Sept 2011: stress test neg  . Depression    sees [name]  . Diabetes mellitus    dr [name]  . Diverticulosis   . Fatty liver    Increased LFTs, saw GI 06/2011, likely from fatty liver   . GERD (gastroesophageal reflux disease)   . History of hiatal hernia   . Hyperlipemia   . Hypertension   . IBS (irritable bowel syndrome)    and dyspepsia  . Memory impairment 08/2014   mild cognitive impairment vs. mild dementia, reommended reinstating of cholinesterase inhibitor   . Osteopenia    dexa 06/2007 and 10/11 Rx CA vitamin d  . RLS (restless legs syndrome)    h/o- no longer using Requip     Social History   Social History  . Marital status: Married    Spouse name: [name]  . Number of children: 3  . Years of education: N/A    Occupational History  . 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 Smoking status: Former Smoker    Packs/day: 2.00    Years: 10.00    Types: Cigarettes    Quit date: 11/26/1975  . Smokeless tobacco: Never Used     Comment: used to smoke 2 ppd  . Alcohol use 0.0 oz/week     Comment: rarely  . Drug use: No  . Sexual activity: Not on file   Other Topics Concern  . Not on file   Social History Narrative   Lives w/  [name]   2 g-child, twins on the way    Past Surgical History:  Procedure Laterality Date  . POLYPECTOMY    . REVERSE SHOULDER ARTHROPLASTY Right 04/21/2014   Procedure: REVERSE SHOULDER ARTHROPLASTY;  Surgeon: Lindsay Ade, MD;  Location: [facility];  Service: Orthopedics;  Laterality: Right;  Right reverse total shoulder replacement  . TONSILLECTOMY    . UTERINE FIBROID EMBOLIZATION     90s    Family History  Problem Relation Age of Onset  . Lung cancer Mother     smoker  .  Ovarian cancer Paternal Aunt     ?  Marland Kitchen Breast cancer Neg Hx   . Rectal cancer Neg Hx   . Stomach cancer Neg Hx   . Esophageal cancer Neg Hx   . Diabetes Other     aunts-uncles   . Colon cancer Father     F dx in his 54s  . Heart attack Other     GM in her 83s  . Stroke Other     aunts-uncles     Allergies  Allergen Reactions  . Sulfa Antibiotics Nausea Only  . Trulicity [Dulaglutide] Nausea Only  . Penicillins Rash    Current Outpatient Prescriptions on File Prior to Visit  Medication Sig Dispense Refill  . acetaminophen (TYLENOL) 500 MG tablet Take 500 mg by mouth every 6 (six) hours as needed.    Marland Kitchen aspirin 81 MG tablet Take 81 mg by mouth daily.    Marland Kitchen atenolol (TENORMIN) 50 MG tablet Take 0.5 tablets (25 mg total) by mouth daily. 45 tablet 1  . atorvastatin (LIPITOR) 40 MG tablet Take 1 tablet (40 mg total) by mouth daily. 90 tablet 2  . B-D ULTRAFINE III SHORT PEN 31G X 8 MM MISC USE AS DIRECTED 100 each 2  . busPIRone (BUSPAR) 15 MG  tablet Take 0.5 tablets by mouth 2 (two) times daily.     Marland Kitchen donepezil (ARICEPT) 10 MG tablet Take 2 tablets (20 mg total) by mouth at bedtime.    . Insulin Detemir (LEVEMIR FLEXTOUCH) 100 UNIT/ML Pen Inject 80 Units into the skin daily.     . metFORMIN (GLUCOPHAGE) 1000 MG tablet Take 1 tablet (1,000 mg total) by mouth 2 (two) times daily with a meal. 180 tablet 3  . omeprazole (PRILOSEC) 20 MG capsule Take 1 capsule (20 mg total) by mouth daily. 30 capsule 6  . vitamin B-12 (CYANOCOBALAMIN) 1000 MCG tablet Take 1,000 mcg by mouth daily.    Marland Kitchen vortioxetine HBr (TRINTELLIX) 20 MG TABS Take 1 tablet by mouth daily.    Marland Kitchen glucose blood (ONE TOUCH ULTRA TEST) test strip Check twice a day (Patient not taking: Reported on 08/07/2015) 200 each 3   No current facility-administered medications on file prior to visit.     BP 128/82   Pulse (!) 55   Temp 98.2 F (36.8 C) (Oral)   Resp 16   Ht 5\' 7"  (1.702 m)   Wt 194 lb (88 kg)   SpO2 97% Comment: room air  BMI 30.38 kg/m    Objective:   Physical Exam  Constitutional: She is oriented to person, place, and time. She appears well-developed and well-nourished.  HENT:  Head: Normocephalic.  Left Ear: External ear normal.  Cardiovascular: Normal rate, regular rhythm and normal heart sounds.   No murmur heard. Pulmonary/Chest: Effort normal and breath sounds normal. No respiratory distress. She has no wheezes.  Abdominal: Soft. Bowel sounds are normal. She exhibits no distension. There is no tenderness. There is no rebound and no guarding.  Neurological: She is alert and oriented to person, place, and time.  Skin: Skin is warm and dry. No rash noted. No erythema. No pallor.  Psychiatric: She has a normal mood and affect. Her behavior is normal. Judgment and thought content normal.          Assessment & Plan:  Nausea with vomiting- will obtain abd [place] to further evaluate GB for possible cholecystitis.  Will increase omeprazole to 40mg  once  daily. Continue PRN zofran. I am suspicious  that buspar may be contributing to her nausea so I have asked her to contact her Psychiatrist to see if it is ok for her to stop. There is an 8% reporting of nausea on this medication.  If US unremarkable and no improvement in nausea off of buspar, plan referral to GI. The patient and the daughter are instructed to contact me in 2 weeks if not improved, sooner if new/worsening symptoms.

## 2015-08-07 NOTE — Patient Instructions (Addendum)
Please contact your psychiatrist to ask if you can stop the Buspar since it may be contributing to your nausea. Increase prilosec from 20mg  to 40mg .  You will be contacted about scheduling your abdominal ultrasound. Continue zofran as needed for nausea. Call if new/worsening symptoms or if nausea is not resolved in 2 weeks. At that time we will plan a referral to GI for further evaluation.

## 2015-08-07 NOTE — Progress Notes (Signed)
Pre visit review using our clinic review tool, if applicable. No additional management support is needed unless otherwise documented below in the visit note. 

## 2015-08-09 ENCOUNTER — Ambulatory Visit (HOSPITAL_BASED_OUTPATIENT_CLINIC_OR_DEPARTMENT_OTHER)
Admission: RE | Admit: 2015-08-09 | Discharge: 2015-08-09 | Disposition: A | Discharge status: Home / Self Care | Payer: Medicare Other | Source: Ambulatory Visit | Attending: Family | Admitting: Family

## 2015-08-09 ENCOUNTER — Ambulatory Visit (HOSPITAL_BASED_OUTPATIENT_CLINIC_OR_DEPARTMENT_OTHER): Admission: RE | Admit: 2015-08-09 | Payer: Medicare Other | Source: Ambulatory Visit | Admitting: Family

## 2015-08-09 ENCOUNTER — Encounter: Payer: Self-pay | Admitting: Internal Medicine

## 2015-08-09 DIAGNOSIS — N289 Disorder of kidney and ureter, unspecified: Secondary | ICD-10-CM | POA: Insufficient documentation

## 2015-08-09 DIAGNOSIS — R112 Nausea with vomiting, unspecified: Secondary | ICD-10-CM | POA: Insufficient documentation

## 2015-08-09 DIAGNOSIS — K760 Fatty (change of) liver, not elsewhere classified: Secondary | ICD-10-CM | POA: Insufficient documentation

## 2015-08-10 ENCOUNTER — Telehealth: Payer: Self-pay | Admitting: Internal Medicine

## 2015-08-10 DIAGNOSIS — R112 Nausea with vomiting, unspecified: Secondary | ICD-10-CM

## 2015-08-10 NOTE — Telephone Encounter (Signed)
Relation to [id number] Call back number:[phone] Pharmacy:  Reason for call:  Patient inquiring about imaging results please advise

## 2015-08-11 NOTE — Telephone Encounter (Signed)
Notified pt. Pt wants to know if this can be causing her nausea?  Please advise?

## 2015-08-11 NOTE — Telephone Encounter (Signed)
Pt called again stating still has not heard about her imaging results. Pt would like to be called since she has not seen results on MYChart. Please advise.

## 2015-08-11 NOTE — Telephone Encounter (Signed)
Imaging shows fatty liver.  Best treatment for fatty liver is low fat/low cholesterol diet, exercise and weight loss. Gallbladder looks ok.  No gallstones.

## 2015-08-12 NOTE — Telephone Encounter (Signed)
I am not sure what is causing her nausea.  Could be the buspar.  I would like to have her see GI for consult.  Could you please place referral dx nausea? tks

## 2015-08-14 ENCOUNTER — Telehealth: Payer: Self-pay | Admitting: Internal Medicine

## 2015-08-14 ENCOUNTER — Telehealth: Payer: Self-pay | Admitting: Gastroenterology

## 2015-08-14 NOTE — Telephone Encounter (Signed)
Pt notified of provider recommendations and verbalized understanding. She is agreeable to proceed w/ GI referral. Prefers Dr. Fuller Plan as this is who did her colonoscopy. Okay to place urgent referral per [facility] as pt stated she is still experiencing N/V.

## 2015-08-14 NOTE — Telephone Encounter (Signed)
Caller name: [name]  Relationship to patient: Daughter  Can be reached: [phone]  Reason for call:At request of Patient please call daughter with the results of her [place] from last week. Patient is still having symptoms.

## 2015-08-14 NOTE — Telephone Encounter (Signed)
Received call from pt and daughter stating they have not been contacted about ultrasound results and pt is still having n/v. Reiterated below recommendations. Pt states she now remembers previous conversation. Gave pt # to contact GI if she has not been contacted within a couple of days to schedule appt. Pt and daughter voice understanding.

## 2015-08-14 NOTE — Telephone Encounter (Signed)
Patient asks to be added to cancellation list for Dr. Fuller Plan.  I added her.  She is advised that I will call her if there is an opening.

## 2015-08-14 NOTE — Telephone Encounter (Signed)
Relation to [id number] Call back number:[phone] Pharmacy: [facility] #[id number] [address], [place] (Phone) [phone] (Fax)     Reason for call:  Patient requesting a refill ondansetron (ZOFRAN) 4 MG tablet

## 2015-08-15 ENCOUNTER — Telehealth: Payer: Self-pay | Admitting: Gastroenterology

## 2015-08-15 NOTE — Telephone Encounter (Signed)
I helped patient reschedule to Monday 08/21/15 9:45 with Dr. Fuller Plan

## 2015-08-21 ENCOUNTER — Other Ambulatory Visit: Payer: Self-pay | Admitting: Internal Medicine

## 2015-08-21 ENCOUNTER — Encounter: Payer: Self-pay | Admitting: Gastroenterology

## 2015-08-21 ENCOUNTER — Ambulatory Visit (INDEPENDENT_AMBULATORY_CARE_PROVIDER_SITE_OTHER): Payer: Medicare Other | Admitting: Gastroenterology

## 2015-08-21 VITALS — BP 122/74 | HR 64 | Ht 67.0 in | Wt 195.0 lb

## 2015-08-21 DIAGNOSIS — R112 Nausea with vomiting, unspecified: Secondary | ICD-10-CM | POA: Diagnosis not present

## 2015-08-21 DIAGNOSIS — K760 Fatty (change of) liver, not elsewhere classified: Secondary | ICD-10-CM

## 2015-08-21 MED ORDER — ONDANSETRON HCL 4 MG PO TABS: 4.0000 mg | ORAL_TABLET | Freq: Three times a day (TID) | ORAL | 2 refills | Status: DC | PRN

## 2015-08-21 NOTE — Patient Instructions (Signed)
We will refill your Zofran  Follow up as needed

## 2015-08-21 NOTE — Progress Notes (Signed)
    History of Present Illness: This is a 70 year old female with fatty liver and IBS presents today with several episodes of nausea and vomiting over the past few months. She is accompanied by her daughter. Pt underwent colonoscopy and EGD in 2016 for iron deficiency anemia and heme + stool. EGD in 10/2014 was normal with normal duodenal biopsies. Colonoscopy in 04/2014 showed 2 small polyps and internal hemorrhoids. SBCE was recommended if iron deficiency recurred or if heme positive stool persisted. Patient's husband died last week and she's been under significant amounts of stress. She relates infrequent episodes of nausea and vomiting that resolve spontaneously for the past couple months, not generally associated with meals or any other activity. She has noted a decreased appetite for the past several weeks and lost about 10 pounds. She is not checking her blood sugar at home. They were concerned that her N/V could be side effects related to BuSpar and although there was a plan in place from several weeks ago to taper and discontinue this medication this is not been accomplished. Zofran has helped her symptoms. She is takes omeprazole daily for GERD and notes no ongoing heartburn or difficulties swallowing. Blood work 3 weeks ago showed mildly elevated glucose otherwise unremarkable. Denies abdominal pain, constipation, diarrhea, change in stool caliber, melena, hematochezia, dysphagia, reflux symptoms, chest pain.  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  Current Medications, Allergies, Past Medical History, Past Surgical History, Family History and Social History were reviewed in [employer] record.  Physical Exam: General: Well developed, well nourished, no acute distress Head: Normocephalic and atraumatic Eyes:  sclerae anicteric, EOMI Ears:  Normal auditory acuity Mouth: No deformity or lesions Lungs: Clear throughout to auscultation Heart: Regular rate and rhythm; no murmurs, rubs or bruits Abdomen: Soft, non tender and non distended. No masses, hepatosplenomegaly or hernias noted. Normal Bowel sounds Musculoskeletal: Symmetrical with no gross deformities  Pulses:  Normal pulses noted Extremities: No clubbing, cyanosis, edema or deformities noted Neurological: Alert oriented x 4, grossly nonfocal Psychological:  Alert and cooperative. Normal mood and affect  Assessment and Recommendations:  1. Intermittent nausea, vomiting and mild weight loss. Rule out BuSpar side effect,  hypoglycemia, hyperglycemia and stress related symptoms. Recent Abd [place] and blood work as above. Patient advised to complete BuSpar taper as recommended by the prescribing physician. She is advised to begin to take her blood sugar at least daily and with any symptoms such as nausea, vomiting, lightheadedness, weakness, etc. Continue omeprazole 40 mg daily and Zofran tid when necessary. If her symptoms persist consider further evaluation with EGD.  2. Fatty liver. Continue low-fat, carb modified diet with goal to maintain normal body weight. Follow-up with PCP and endocrinologist for optimal management of diabetes.

## 2015-08-24 ENCOUNTER — Telehealth: Payer: Self-pay | Admitting: Internal Medicine

## 2015-08-24 MED ORDER — VITAMIN B-12 1000 MCG PO TABS: 1000.0000 ug | ORAL_TABLET | Freq: Every day | ORAL | 3 refills | Status: DC

## 2015-08-24 MED ORDER — FERROUS FUMARATE-VITAMIN C ER 65-25 MG PO TBCR: 1.0000 | EXTENDED_RELEASE_TABLET | Freq: Every day | ORAL | 3 refills | Status: DC

## 2015-08-24 MED ORDER — ASPIRIN 81 MG PO TABS: 81.0000 mg | ORAL_TABLET | Freq: Every day | ORAL | 3 refills | Status: DC

## 2015-08-24 NOTE — Telephone Encounter (Signed)
Okay to send these to pharmacy as Rx instead OTC?

## 2015-08-24 NOTE — Telephone Encounter (Signed)
Rxs sent

## 2015-08-24 NOTE — Telephone Encounter (Signed)
Okay to prescribe for a year

## 2015-08-24 NOTE — Telephone Encounter (Signed)
Pharmacy:  [facility],  - [place], [place] - [address] [phone] (Phone) [phone] (Fax)     Reason for call: aspirin 81 MG tablet [id number]  vitamin B-12 (CYANOCOBALAMIN) 1000 MCG tablet [id number]   Ferrous Fumarate-Vitamin C ER (FERRO-SEQUELS) 65-25 MG TBCR [id number]

## 2015-08-28 ENCOUNTER — Ambulatory Visit: Payer: Self-pay | Admitting: Internal Medicine

## 2015-08-29 ENCOUNTER — Other Ambulatory Visit: Payer: Self-pay | Admitting: Internal Medicine

## 2015-08-31 ENCOUNTER — Telehealth: Payer: Self-pay | Admitting: Gastroenterology

## 2015-08-31 ENCOUNTER — Ambulatory Visit: Payer: 59 | Admitting: Psychology

## 2015-08-31 NOTE — Telephone Encounter (Signed)
OK to schedule EGD Please see my last office note. Check that she is taking her blood sugars at least daily and they are ok.  Please be sure she is taking omeprazole every day.

## 2015-08-31 NOTE — Telephone Encounter (Signed)
Attempted to call  Pt but received message that voicemail is full, will try later.

## 2015-08-31 NOTE — Telephone Encounter (Signed)
Pt called and states she is still having problems with nausea and vomiting. Per last OV note it mentions if symptoms persist consider further eval with EGD. Please advise.

## 2015-09-04 NOTE — Telephone Encounter (Signed)
Patient reports that she is not checking daily, but when she does they are under 150.  She is scheduled for EGD for 09/07/15 3:30 and pre-visit 09/05/15 2:00

## 2015-09-05 ENCOUNTER — Ambulatory Visit (AMBULATORY_SURGERY_CENTER): Payer: Self-pay

## 2015-09-05 VITALS — Ht 66.5 in | Wt 191.4 lb

## 2015-09-05 DIAGNOSIS — R112 Nausea with vomiting, unspecified: Secondary | ICD-10-CM

## 2015-09-05 DIAGNOSIS — R110 Nausea: Secondary | ICD-10-CM

## 2015-09-05 NOTE — Progress Notes (Signed)
Per pt, no allergies to soy or egg products.Pt not taking any weight loss meds or using  O2 at home. 

## 2015-09-07 ENCOUNTER — Ambulatory Visit (AMBULATORY_SURGERY_CENTER): Payer: Medicare Other | Admitting: Gastroenterology

## 2015-09-07 ENCOUNTER — Encounter: Payer: Self-pay | Admitting: Gastroenterology

## 2015-09-07 VITALS — BP 133/67 | HR 55 | Temp 97.1°F | Resp 13 | Ht 66.5 in | Wt 191.0 lb

## 2015-09-07 DIAGNOSIS — R112 Nausea with vomiting, unspecified: Secondary | ICD-10-CM

## 2015-09-07 LAB — GLUCOSE, CAPILLARY
Glucose-Capillary: 90 mg/dL (ref 65–99)
Glucose-Capillary: 92 mg/dL (ref 65–99)

## 2015-09-07 MED ORDER — OMEPRAZOLE 40 MG PO CPDR: 40.0000 mg | DELAYED_RELEASE_CAPSULE | Freq: Two times a day (BID) | ORAL | 3 refills | Status: DC

## 2015-09-07 MED ORDER — SODIUM CHLORIDE 0.9 % IV SOLN: 500.0000 mL | INTRAVENOUS | Status: DC

## 2015-09-07 MED ORDER — OMEPRAZOLE 40 MG PO CPDR: 40.0000 mg | DELAYED_RELEASE_CAPSULE | Freq: Every day | ORAL | 9 refills | Status: DC

## 2015-09-07 NOTE — Progress Notes (Signed)
Report to PACU, RN, vss, BBS= Clear.  

## 2015-09-07 NOTE — Op Note (Signed)
[name]: [name] Procedure Date: 09/07/2015 3:16 PM MRN: [id number] Endoscopist: Ladene Artist , MD Age: 70 Referring MD:  Date of Birth: February 05, 1945 Gender: Female Account #: [id number] Procedure:                Upper GI endoscopy Indications:              Nausea with vomiting Medicines:                Monitored Anesthesia Care Procedure:                Pre-Anesthesia Assessment:                           - Prior to the procedure, a History and Physical                            was performed, and patient medications and                            allergies were reviewed. The patient's tolerance of                            previous anesthesia was also reviewed. The risks                            and benefits of the procedure and the sedation                            options and risks were discussed with the patient.                            All questions were answered, and informed consent                            was obtained. Prior Anticoagulants: The patient has                            taken no previous anticoagulant or antiplatelet                            agents. ASA Grade Assessment: III - A patient with                            severe systemic disease. After reviewing the risks                            and benefits, the patient was deemed in                            satisfactory condition to undergo the procedure.                           After obtaining informed consent, the endoscope was  passed under direct vision. Throughout the                            procedure, the patient's blood pressure, pulse, and                            oxygen saturations were monitored continuously. The                            Model GIF-[id number]) scope was introduced                            through the mouth, and advanced to the second part                            of duodenum. The upper GI  endoscopy was                            accomplished without difficulty. The patient                            tolerated the procedure well. Scope In: Scope Out: Findings:                 The esophagus was normal.                           The stomach was normal.                           The examined duodenum was normal.                           The cardia and gastric fundus were normal on                            retroflexion. Complications:            No immediate complications. Estimated Blood Loss:     Estimated blood loss: none. Impression:               - Normal esophagus.                           - Normal stomach.                           - Normal examined duodenum.                           - No specimens collected. Recommendation:           - Patient has a contact number available for                            emergencies. The signs and symptoms of potential  delayed complications were discussed with the                            patient. Return to normal activities tomorrow.                            Written discharge instructions were provided to the                            patient.                           - Resume previous diet.                           - Continue present medications.                           - Prilosec (omeprazole) 40 mg PO BID for 3 months                            then reduce to 40 mg daily. Ladene Artist, MD 09/07/2015 3:30:56 PM This report has been signed electronically.

## 2015-09-07 NOTE — Patient Instructions (Signed)
Discharge instructions given. Normal exam. Resume previous medications. Prescription sent into pharmacy. YOU HAD AN ENDOSCOPIC PROCEDURE TODAY AT [facility]:   Refer to the procedure report that was given to you for any specific questions about what was found during the examination.  If the procedure report does not answer your questions, please call your gastroenterologist to clarify.  If you requested that your care partner not be given the details of your procedure findings, then the procedure report has been included in a sealed envelope for you to review at your convenience later.  YOU SHOULD EXPECT: Some feelings of bloating in the abdomen. Passage of more gas than usual.  Walking can help get rid of the air that was put into your GI tract during the procedure and reduce the bloating. If you had a lower endoscopy (such as a colonoscopy or flexible sigmoidoscopy) you may notice spotting of blood in your stool or on the toilet paper. If you underwent a bowel prep for your procedure, you may not have a normal bowel movement for a few days.  Please Note:  You might notice some irritation and congestion in your nose or some drainage.  This is from the oxygen used during your procedure.  There is no need for concern and it should clear up in a day or so.  SYMPTOMS TO REPORT IMMEDIATELY: r   Following upper endoscopy (EGD)  Vomiting of blood or coffee ground material  New chest pain or pain under the shoulder blades  Painful or persistently difficult swallowing  New shortness of breath  Fever of 100F or higher  Black, tarry-looking stools  For urgent or emergent issues, a gastroenterologist can be reached at any hour by calling [phone].   DIET:  We do recommend a small meal at first, but then you may proceed to your regular diet.  Drink plenty of fluids but you should avoid alcoholic beverages for 24 hours.  ACTIVITY:  You should plan to take it easy for the rest  of today and you should NOT DRIVE or use heavy machinery until tomorrow (because of the sedation medicines used during the test).    FOLLOW UP: Our staff will call the number listed on your records the next business day following your procedure to check on you and address any questions or concerns that you may have regarding the information given to you following your procedure. If we do not reach you, we will leave a message.  However, if you are feeling well and you are not experiencing any problems, there is no need to return our call.  We will assume that you have returned to your regular daily activities without incident.  If any biopsies were taken you will be contacted by phone or by letter within the next 1-3 weeks.  Please call us at [phone] if you have not heard about the biopsies in 3 weeks.    SIGNATURES/CONFIDENTIALITY: You and/or your care partner have signed paperwork which will be entered into your electronic medical record.  These signatures attest to the fact that that the information above on your After Visit Summary has been reviewed and is understood.  Full responsibility of the confidentiality of this discharge information lies with you and/or your care-partner.

## 2015-09-08 ENCOUNTER — Telehealth: Payer: Self-pay | Admitting: *Deleted

## 2015-09-08 NOTE — Telephone Encounter (Signed)
  Follow up Call-  Call back number 09/07/2015 11/04/2014 04/15/2014  Post procedure Call Back phone  # [phone] cell  Permission to leave phone message Yes Yes Yes  Some recent data might be hidden     Patient questions:  Do you have a fever, pain , or abdominal swelling? No. Pain Score  0 *  Have you tolerated food without any problems? Yes.    Have you been able to return to your normal activities? Yes.    Do you have any questions about your discharge instructions: Diet   No. Medications  No. Follow up visit  No.  Do you have questions or concerns about your Care? No.  Actions: * If pain score is 4 or above: No action needed, pain <4.

## 2015-09-18 ENCOUNTER — Ambulatory Visit (INDEPENDENT_AMBULATORY_CARE_PROVIDER_SITE_OTHER): Payer: Medicare Other | Admitting: Internal Medicine

## 2015-09-18 ENCOUNTER — Encounter: Payer: Self-pay | Admitting: Internal Medicine

## 2015-09-18 VITALS — BP 120/82 | HR 70 | Temp 98.0°F | Resp 14 | Ht 67.0 in | Wt 188.0 lb

## 2015-09-18 DIAGNOSIS — Z23 Encounter for immunization: Secondary | ICD-10-CM | POA: Diagnosis not present

## 2015-09-18 DIAGNOSIS — R110 Nausea: Secondary | ICD-10-CM

## 2015-09-18 DIAGNOSIS — F32A Depression, unspecified: Secondary | ICD-10-CM

## 2015-09-18 DIAGNOSIS — F329 Major depressive disorder, single episode, unspecified: Secondary | ICD-10-CM | POA: Diagnosis not present

## 2015-09-18 LAB — CBC WITH DIFFERENTIAL/PLATELET
Basophils Absolute: 0 10*3/uL (ref 0.0–0.1)
Basophils Relative: 0.4 % (ref 0.0–3.0)
Eosinophils Absolute: 0.1 10*3/uL (ref 0.0–0.7)
Eosinophils Relative: 1 % (ref 0.0–5.0)
HCT: 38 % (ref 36.0–46.0)
Hemoglobin: 12.8 g/dL (ref 12.0–15.0)
Lymphocytes Relative: 32.5 % (ref 12.0–46.0)
Lymphs Abs: 2.7 10*3/uL (ref 0.7–4.0)
MCHC: 33.8 g/dL (ref 30.0–36.0)
MCV: 83.3 fl (ref 78.0–100.0)
Monocytes Absolute: 0.4 10*3/uL (ref 0.1–1.0)
Monocytes Relative: 4.8 % (ref 3.0–12.0)
Neutro Abs: 5.2 10*3/uL (ref 1.4–7.7)
Neutrophils Relative %: 61.3 % (ref 43.0–77.0)
Platelets: 199 10*3/uL (ref 150.0–400.0)
RBC: 4.56 Mil/uL (ref 3.87–5.11)
RDW: 14.1 % (ref 11.5–15.5)
WBC: 8.4 10*3/uL (ref 4.0–10.5)

## 2015-09-18 NOTE — Assessment & Plan Note (Signed)
Vomiting w/ minimal nausea: This was a main focus of the visit, etiology is unclear, already saw GI, EGD wnl, recent labs also satisfactory. Etiology not clear, very atypical presentation of gastroparesis?, related to BuSpar? Related to a CNS process? Also could be d/t hypoglycemia ( she is on insulin, A1c 6.2) thus  recommend to check a CBG when she has symptoms. We agreed to check a CBC today and she will call in 2- 3 weeks, if she is not better, will have to proceed with further eval. CT head? Depression, sees Dr. Clovis Pu, recently lost her husband but fortunately she is doing okay for the circumstances. She does not look depressed or anxious today. Primary care: Flu shot RTC 3 months, CPX.

## 2015-09-18 NOTE — Patient Instructions (Signed)
GO TO THE LAB : Get the blood work     GO TO THE FRONT DESK Schedule your next appointment for a physical exam in 3-4 months, fasting.   Call me in 2 or 3 weeks if you continue with episodes of vomiting, call anytime if you have severe symptoms, fever, chills, stomach pain, change in the color of stools.

## 2015-09-18 NOTE — Progress Notes (Signed)
Pre visit review using our clinic review tool, if applicable. No additional management support is needed unless otherwise documented below in the visit note. 

## 2015-09-18 NOTE — Progress Notes (Signed)
Subjective:    Patient ID: [name], female    DOB: [birth year], 70 y.o.   MRN: [id number]  DOS:  09/18/2015 Type of visit - description : rov, Her daughter [name] joined [place] by phone Interval history: The patient lost her husband recently, doing okay emotionally. Her main concern today is episodes of vomiting, started 2 or 3 months ago, usually 2 or 3 episodes a week, minimal nausea after words, not necessarily associated with food intake. She has been on trintellix for the last 6 months and BuSpar most recently. She DC BuSpar 2 weeks ago thinking that could be a side effect but symptoms are about the same. She has some documented weight loss.  Wt Readings from Last 3 Encounters:  09/18/15 188 lb (85.3 kg)  09/07/15 191 lb (86.6 kg)  09/05/15 191 lb 6.4 oz (86.8 kg)     Review of Systems Denies fever chills No blood in the stools, abd pain, no  feeling bloated. No dysphagia or odynophagia No dysuria or gross hematuria No headaches   Past Medical History:  Diagnosis Date  . Adenomatous colon polyp 03/1988  . Adenomatous colon polyp 1990  . Anemia    borderline  . Arthritis    R shoulder - degenerative   . Cataract    beginning stages  . Chest pain     Sept 2011: stress test neg  . Depression    sees Dr.Cotle  . Diabetes mellitus    dr Buddy Duty  . Diverticulosis   . Fatty liver    Increased LFTs, saw GI 06/2011, likely from fatty liver   . GERD (gastroesophageal reflux disease)   . History of hiatal hernia   . Hyperlipemia   . Hypertension   . IBS (irritable bowel syndrome)    and dyspepsia  . Memory impairment 08/2014   mild cognitive impairment vs. mild dementia, reommended reinstating of cholinesterase inhibitor   . Nausea and vomiting    on and off for weeks  . Osteopenia    dexa 06/2007 and 10/11 Rx CA vitamin d  . RLS (restless legs syndrome)    h/o- no longer using Requip    Past Surgical History:  Procedure Laterality Date  . POLYPECTOMY    .  REVERSE SHOULDER ARTHROPLASTY Right 04/21/2014   Procedure: REVERSE SHOULDER ARTHROPLASTY;  Surgeon: Tania Ade, MD;  Location: [facility];  Service: Orthopedics;  Laterality: Right;  Right reverse total shoulder replacement  . TONSILLECTOMY    . UTERINE FIBROID EMBOLIZATION     90s    Social History   Social History  . Marital status: Widowed    Spouse name: [name]  . Number of children: 3  . Years of education: N/A   Occupational History  . 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 Smoking status: Former Smoker    Packs/day: 2.00    Years: 10.00    Types: Cigarettes    Quit date: 11/26/1975  . Smokeless tobacco: Never Used     Comment: used to smoke 2 ppd  . Alcohol use 0.0 oz/week     Comment: rarely  . Drug use: No  . Sexual activity: Not on file   Other Topics Concern  . Not on file   Social History Narrative   Lives w/  [name]   2 g-child, twins on the way        Medication List  Accurate as of 09/18/15  5:52 PM. Always use your most recent med list.          acetaminophen 500 MG tablet Commonly known as:  TYLENOL Take 500 mg by mouth every 6 (six) hours as needed.   ARICEPT 10 MG tablet Generic drug:  donepezil Take 2 tablets (20 mg total) by mouth at bedtime.   aspirin 81 MG tablet Take 1 tablet (81 mg total) by mouth daily.   atenolol 50 MG tablet Commonly known as:  TENORMIN Take 0.5 tablets (25 mg total) by mouth daily.   atorvastatin 40 MG tablet Commonly known as:  LIPITOR Take 1 tablet (40 mg total) by mouth daily.   B-D ULTRAFINE III SHORT PEN 31G X 8 MM Misc Generic drug:  Insulin Pen Needle USE AS DIRECTED   Ferrous Fumarate-Vitamin C ER 65-25 MG Tbcr Commonly known as:  FERRO-SEQUELS Take 1 tablet by mouth daily.   glucose blood test strip Commonly known as:  ONE TOUCH ULTRA TEST Check twice a day   LEVEMIR FLEXTOUCH 100 UNIT/ML Pen Generic drug:  Insulin Detemir Inject 80 Units  into the skin daily.   losartan 25 MG tablet Commonly known as:  COZAAR Take 1 tablet (25 mg total) by mouth every evening.   metFORMIN 1000 MG tablet Commonly known as:  GLUCOPHAGE Take 1 tablet (1,000 mg total) by mouth 2 (two) times daily with a meal.   omeprazole 40 MG capsule Commonly known as:  PRILOSEC Take 1 capsule (40 mg total) by mouth 2 (two) times daily.   omeprazole 40 MG capsule Commonly known as:  PRILOSEC Take 1 capsule (40 mg total) by mouth daily.   ondansetron 4 MG tablet Commonly known as:  ZOFRAN Take 1-2 tablets (4-8 mg total) by mouth every 8 (eight) hours as needed for nausea or vomiting.   TRINTELLIX 20 MG Tabs Generic drug:  vortioxetine HBr Take 1 tablet by mouth daily.   vitamin B-12 1000 MCG tablet Commonly known as:  CYANOCOBALAMIN Take 1 tablet (1,000 mcg total) by mouth daily.          Objective:   Physical Exam BP 120/82 (BP Location: Left Arm, Patient Position: Sitting, Cuff Size: Normal)   Pulse 70   Temp 98 F (36.7 C) (Oral)   Resp 14   Ht 5\' 7"  (1.702 m)   Wt 188 lb (85.3 kg)   SpO2 97%   BMI 29.44 kg/m  General:   Well developed, well nourished . NAD.  HEENT:  Normocephalic . Face symmetric, atraumatic Lungs:  CTA B Normal respiratory effort, no intercostal retractions, no accessory muscle use. Heart: RRR,  no murmur.  no pretibial edema bilaterally  Abdomen:  Not distended, soft, non-tender. No rebound or rigidity.  Skin: Not pale. Not jaundice Neurologic:  alert & oriented X3.  Speech normal, gait appropriate for age and unassisted. Pupils equal and reactive, motor symmetric  Psych--  Cognition and judgment appear intact.  Cooperative with normal attention span and concentration.  Behavior appropriate. No anxious or depressed appearing.    Assessment & Plan:    Assessment > DM Dr. Buddy Duty HTN Hyperlipidemia Depression Dr. Clovis Pu OSA -- mild, saw Dr Gwenette Greet 2014, no CPAP GI: --IBS, colon polyps,  diverticulosis, hiatal hernia --Chronic constipation likely part of her IBS syndrome  --Fatty liver >>> GI eval 2013 --iron fec anemia:  cscope 04-2014 : 2 polyps; + hemocult @ GI office 10-2014: EGD done (-), bx neg Osteopenia: T score -1.1  2009 ,  osteopenia again per dexa 05-2013 , rx ca-vit d B12 deficiency  RLS MSK: --DJD frozen shoulder Mild cognitive impairment  MMSE 2015 --> 28, on Aricept, last visit w/ neuro, Dr Everette Rank 02-2015  Dizziness: chronic, carotid US 2016 neg, saw cards-- not likely CV related; saw neuro DR Everette Rank 02-2015  Chest pain 09-2009, negative stress test Thyroid nodules: Incidental   by Carotid US, BX 11-2014: Atypical findings, [facility], f/u  Endocrine  PLAN: Vomiting w/ minimal nausea: This was a main focus of the visit, etiology is unclear, already saw GI, EGD wnl, recent labs also satisfactory. Etiology not clear, very atypical presentation of gastroparesis?, related to BuSpar? Related to a CNS process? Also could be d/t hypoglycemia ( she is on insulin, A1c 6.2) thus  recommend to check a CBG when she has symptoms. We agreed to check a CBC today and she will call in 2- 3 weeks, if she is not better, will have to proceed with further eval. CT head? Depression, sees Dr. Clovis Pu, recently lost her husband but fortunately she is doing okay for the circumstances. She does not look depressed or anxious today. Primary care: Flu shot RTC 3 months, CPX.  Today, I spent more than   28 min with the patient: >50% of the time counseling regards my rationale for not ordering further testing at this time regards nausea also reviewing the chart and labs ordered by other providers  . Also answering questions from the patient and her daughter.

## 2015-09-25 ENCOUNTER — Ambulatory Visit (INDEPENDENT_AMBULATORY_CARE_PROVIDER_SITE_OTHER): Payer: 59 | Admitting: Psychology

## 2015-09-25 DIAGNOSIS — F331 Major depressive disorder, recurrent, moderate: Secondary | ICD-10-CM

## 2015-10-03 ENCOUNTER — Ambulatory Visit (INDEPENDENT_AMBULATORY_CARE_PROVIDER_SITE_OTHER): Payer: 59 | Admitting: Psychology

## 2015-10-03 DIAGNOSIS — F331 Major depressive disorder, recurrent, moderate: Secondary | ICD-10-CM | POA: Diagnosis not present

## 2015-10-05 ENCOUNTER — Encounter: Payer: Self-pay | Admitting: Internal Medicine

## 2015-10-05 LAB — HM DEXA SCAN

## 2015-10-09 ENCOUNTER — Encounter: Payer: Self-pay | Admitting: Internal Medicine

## 2015-10-09 ENCOUNTER — Ambulatory Visit: Payer: Self-pay | Admitting: Gastroenterology

## 2015-10-11 ENCOUNTER — Encounter: Payer: Self-pay | Admitting: Internal Medicine

## 2015-10-11 ENCOUNTER — Other Ambulatory Visit: Payer: Self-pay | Admitting: Radiology

## 2015-10-11 HISTORY — PX: BREAST BIOPSY: SHX20

## 2015-10-11 LAB — HM MAMMOGRAPHY

## 2015-10-16 ENCOUNTER — Ambulatory Visit: Payer: 59 | Admitting: Psychology

## 2015-10-19 ENCOUNTER — Ambulatory Visit: Payer: 59 | Admitting: Psychology

## 2015-10-19 ENCOUNTER — Ambulatory Visit (INDEPENDENT_AMBULATORY_CARE_PROVIDER_SITE_OTHER): Payer: Medicare Other | Admitting: Family Medicine

## 2015-10-19 ENCOUNTER — Ambulatory Visit (HOSPITAL_BASED_OUTPATIENT_CLINIC_OR_DEPARTMENT_OTHER)
Admission: RE | Admit: 2015-10-19 | Discharge: 2015-10-19 | Disposition: A | Discharge status: Home / Self Care | Payer: Medicare Other | Source: Ambulatory Visit | Attending: Family Medicine | Admitting: Family Medicine

## 2015-10-19 ENCOUNTER — Encounter: Payer: Self-pay | Admitting: Family Medicine

## 2015-10-19 VITALS — BP 152/88 | HR 57 | Temp 98.2°F | Ht 66.0 in | Wt 192.2 lb

## 2015-10-19 DIAGNOSIS — M7732 Calcaneal spur, left foot: Secondary | ICD-10-CM | POA: Diagnosis not present

## 2015-10-19 DIAGNOSIS — S99922A Unspecified injury of left foot, initial encounter: Secondary | ICD-10-CM

## 2015-10-19 DIAGNOSIS — M79675 Pain in left toe(s): Secondary | ICD-10-CM | POA: Diagnosis not present

## 2015-10-19 DIAGNOSIS — F329 Major depressive disorder, single episode, unspecified: Secondary | ICD-10-CM

## 2015-10-19 DIAGNOSIS — W228XXA Striking against or struck by other objects, initial encounter: Secondary | ICD-10-CM | POA: Insufficient documentation

## 2015-10-19 DIAGNOSIS — F32A Depression, unspecified: Secondary | ICD-10-CM

## 2015-10-19 NOTE — Progress Notes (Signed)
Chief Complaint  Patient presents with  . Toe Pain    Pt reports tearing toe nail on Lt foot at daughters house yesterday     Subjective: Patient is a 70 y.o. female here for a toenail issue.  Kicked edge of table yesterday and split toenail. Toe hurts, it bled when it happened. No numbness or tingling, walking is fine. She wants to make sure it is not infected. No drainage or spreading redness.   ROS: Skin: As noted in HPI  Family History  Problem Relation Age of Onset  . Lung cancer Mother     smoker  . Colon cancer Father     F dx in his 51s  . Ovarian cancer Paternal Aunt     ?  . Diabetes Other     aunts-uncles   . Heart attack Other     GM in her 77s  . Stroke Other     aunts-uncles   . Breast cancer Neg Hx   . Rectal cancer Neg Hx   . Stomach cancer Neg Hx   . Esophageal cancer Neg Hx    Past Medical History:  Diagnosis Date  . Adenomatous colon polyp 03/1988  . Adenomatous colon polyp 1990  . Anemia    borderline  . Arthritis    R shoulder - degenerative   . Cataract    beginning stages  . Chest pain     Sept 2011: stress test neg  . Depression    sees Dr.Cotle  . Diabetes mellitus    dr Buddy Duty  . Diverticulosis   . Fatty liver    Increased LFTs, saw GI 06/2011, likely from fatty liver   . GERD (gastroesophageal reflux disease)   . History of hiatal hernia   . Hyperlipemia   . Hypertension   . IBS (irritable bowel syndrome)    and dyspepsia  . Memory impairment 08/2014   mild cognitive impairment vs. mild dementia, reommended reinstating of cholinesterase inhibitor   . Nausea and vomiting    on and off for weeks  . Osteopenia    dexa 06/2007 and 10/11 Rx CA vitamin d  . RLS (restless legs syndrome)    h/o- no longer using Requip   Allergies  Allergen Reactions  . Sulfa Antibiotics Nausea Only  . Trulicity [Dulaglutide] Nausea Only  . Penicillins Rash    Current Outpatient Prescriptions:  .  aspirin 81 MG tablet, Take 1 tablet (81 mg total)  by mouth daily., Disp: 90 tablet, Rfl: 3 .  atenolol (TENORMIN) 50 MG tablet, Take 0.5 tablets (25 mg total) by mouth daily., Disp: 45 tablet, Rfl: 1 .  atorvastatin (LIPITOR) 40 MG tablet, Take 1 tablet (40 mg total) by mouth daily., Disp: 90 tablet, Rfl: 1 .  Ferrous Fumarate-Vitamin C ER (FERRO-SEQUELS) 65-25 MG TBCR, Take 1 tablet by mouth daily., Disp: 90 tablet, Rfl: 3 .  glucose blood (ONE TOUCH ULTRA TEST) test strip, Check twice a day, Disp: 200 each, Rfl: 3 .  Insulin Detemir (LEVEMIR FLEXTOUCH) 100 UNIT/ML Pen, Inject 80 Units into the skin daily. , Disp: , Rfl:  .  losartan (COZAAR) 25 MG tablet, Take 1 tablet (25 mg total) by mouth every evening., Disp: 90 tablet, Rfl: 1 .  omeprazole (PRILOSEC) 40 MG capsule, Take 1 capsule (40 mg total) by mouth daily., Disp: 30 capsule, Rfl: 9 .  vortioxetine HBr (TRINTELLIX) 20 MG TABS, Take 1 tablet by mouth daily., Disp: , Rfl:  .  acetaminophen (TYLENOL) 500  MG tablet, Take 500 mg by mouth every 6 (six) hours as needed., Disp: , Rfl:  .  B-D ULTRAFINE III SHORT PEN 31G X 8 MM MISC, USE AS DIRECTED (Patient not taking: Reported on 10/19/2015), Disp: 100 each, Rfl: 2 .  donepezil (ARICEPT) 10 MG tablet, Take 2 tablets (20 mg total) by mouth at bedtime., Disp: , Rfl:  .  metFORMIN (GLUCOPHAGE) 1000 MG tablet, Take 1 tablet (1,000 mg total) by mouth 2 (two) times daily with a meal. (Patient not taking: Reported on 10/19/2015), Disp: 180 tablet, Rfl: 3 .  omeprazole (PRILOSEC) 40 MG capsule, Take 1 capsule (40 mg total) by mouth 2 (two) times daily. (Patient not taking: Reported on 10/19/2015), Disp: 60 capsule, Rfl: 3 .  ondansetron (ZOFRAN) 4 MG tablet, Take 1-2 tablets (4-8 mg total) by mouth every 8 (eight) hours as needed for nausea or vomiting. (Patient not taking: Reported on 10/19/2015), Disp: 30 tablet, Rfl: 2 .  vitamin B-12 (CYANOCOBALAMIN) 1000 MCG tablet, Take 1 tablet (1,000 mcg total) by mouth daily. (Patient not taking: Reported on  10/19/2015), Disp: 90 tablet, Rfl: 3  Current Facility-Administered Medications:  .  0.9 %  sodium chloride infusion, 500 mL, Intravenous, Continuous, Ladene Artist, MD  Objective: BP (!) 152/88 (BP Location: Left Arm, Patient Position: Sitting, Cuff Size: Small)   Pulse (!) 57   Temp 98.2 F (36.8 C) (Oral)   Ht 5\' 6"  (1.676 m)   Wt 192 lb 3.2 oz (87.2 kg)   SpO2 98%   BMI 31.02 kg/m  General: Awake, appears stated age Heart: brisk cap refill Lungs: No accessory muscle use Skin: There is a small tear in skin at tip of 4th digit on L, nail plate is divided down middle without displacement of the nail, it is TTP over the distal phalanx. No drainage. Neuro: Sensation intact to light touch Psych: Age appropriate judgment and insight, normal affect and mood  Assessment and Plan: Toe injury, left, initial encounter - Plan: DG Toe 4th Left  Depression, unspecified depression type - Plan: Ambulatory referral to Psychiatry  Orders as above. Soak toe in iodine water twice daily for 1 week to prevent infection. Told pt to assume XR is neg unless she hears otherwise. If there is a dorsal phalanx fx, will call in abx for concern of open fx. No fx noted on either film. Referral to Dr. Albertine Patricia of [facility] made per request of patient. She is not pleased with her current psychiatrist at this time.   F/u with regular PCP as scheduled. The patient voiced understanding and agreement to the plan.  [place], DO 10/19/15  4:48 PM

## 2015-10-19 NOTE — Patient Instructions (Addendum)
Use iodine/betadine soaks twice a day for the next 7 days.   If you don't hear anything about your X-ray, then assume it is normal.

## 2015-10-20 ENCOUNTER — Ambulatory Visit: Payer: Self-pay | Admitting: Internal Medicine

## 2015-10-20 ENCOUNTER — Encounter: Payer: Self-pay | Admitting: Internal Medicine

## 2015-10-24 ENCOUNTER — Other Ambulatory Visit: Payer: Self-pay | Admitting: Internal Medicine

## 2015-11-07 ENCOUNTER — Ambulatory Visit (INDEPENDENT_AMBULATORY_CARE_PROVIDER_SITE_OTHER): Payer: 59 | Admitting: Psychology

## 2015-11-07 DIAGNOSIS — F331 Major depressive disorder, recurrent, moderate: Secondary | ICD-10-CM | POA: Diagnosis not present

## 2015-11-09 ENCOUNTER — Encounter: Payer: Self-pay | Admitting: Internal Medicine

## 2015-11-09 ENCOUNTER — Telehealth: Payer: Self-pay

## 2015-11-09 NOTE — Telephone Encounter (Signed)
Bone density results showing osteopenia, recommend Calcium and Vit D OTC per PCP. Bone density abstracted and sent for scanning. MyChart message sent to Pt.

## 2015-11-21 ENCOUNTER — Ambulatory Visit (INDEPENDENT_AMBULATORY_CARE_PROVIDER_SITE_OTHER): Payer: 59 | Admitting: Psychology

## 2015-11-21 DIAGNOSIS — F331 Major depressive disorder, recurrent, moderate: Secondary | ICD-10-CM

## 2015-11-27 ENCOUNTER — Ambulatory Visit (INDEPENDENT_AMBULATORY_CARE_PROVIDER_SITE_OTHER): Payer: Medicare Other | Admitting: Internal Medicine

## 2015-11-27 ENCOUNTER — Other Ambulatory Visit: Payer: Self-pay

## 2015-11-27 ENCOUNTER — Encounter: Payer: Self-pay | Admitting: Internal Medicine

## 2015-11-27 ENCOUNTER — Telehealth: Payer: Self-pay | Admitting: Internal Medicine

## 2015-11-27 VITALS — BP 132/78 | HR 68 | Temp 97.8°F | Resp 12 | Ht 66.0 in | Wt 189.5 lb

## 2015-11-27 DIAGNOSIS — R32 Unspecified urinary incontinence: Secondary | ICD-10-CM

## 2015-11-27 LAB — POCT URINALYSIS DIPSTICK
Bilirubin, UA: NEGATIVE
Blood, UA: NEGATIVE
Glucose, UA: NEGATIVE
Ketones, UA: NEGATIVE
Leukocytes, UA: NEGATIVE
Nitrite, UA: NEGATIVE
Protein, UA: NEGATIVE
Spec Grav, UA: 1.015
Urobilinogen, UA: 0.2
pH, UA: 5.5

## 2015-11-27 MED ORDER — DARIFENACIN HYDROBROMIDE ER 7.5 MG PO TB24: 7.5000 mg | ORAL_TABLET | Freq: Every day | ORAL | 3 refills | Status: DC

## 2015-11-27 NOTE — Telephone Encounter (Signed)
Please call [name] and see what her concerns are

## 2015-11-27 NOTE — Telephone Encounter (Signed)
Spoke w/ [name], informed her of PCP recommendations and addition of Enablex. Informed of potential side effects but that PCP discussed w/ Pt and recommendations to f/u in 2 months or sooner if needed. [name] verbalized understanding.

## 2015-11-27 NOTE — Telephone Encounter (Signed)
See my note, please advise patient's daughter of my recommendation. The main thing is to be aware of potential side effects of enablex--  dry mouth, constipation, confusion, etc. I gave her a printout regards the new medication.

## 2015-11-27 NOTE — Telephone Encounter (Signed)
Patient's daughter [name] called stating that patient is coming in alone this afternoon and she would like to give Dr. Larose Kells some details about her appointment because she is afraid that her mother will not remember everything she needs to tell him. Patient's appt in at 2:15pm. Please advise.  *daughter [name] is on DPR*    Daughter Phone: [phone]

## 2015-11-27 NOTE — Patient Instructions (Addendum)
Try Enablex  1 tablet daily, watch for excessive somnolence Drink plenty of clear fluids  Call if not gradually improving. Watch for side effects

## 2015-11-27 NOTE — Telephone Encounter (Signed)
FYI

## 2015-11-27 NOTE — Progress Notes (Signed)
Pre visit review using our clinic review tool, if applicable. No additional management support is needed unless otherwise documented below in the visit note. 

## 2015-11-27 NOTE — Telephone Encounter (Signed)
Spoke w/ Pt's daughter, [name], she wanted to make PCP aware of ongoing urinary issues with her Mom. Pt has been having incontinence for 6 months to 1 year with initially just leaking when she sneezed, laughed, coughs, etc. However, within the last 4 months urinary incontinence has gotten worse. Pt uses depends underwear more frequently now, especially when she is having "spells of urgency" which she is unable to make it to the bathroom (daughter states this happens 1-2 times per week if not more). [name] isn't sure if symptoms are related to passing of her father several months ago. She is requesting call back by PCP after appointment today to discuss findings (she wishes she could have came with Pt today but was unable due to schedule). Informed her that if PCP is able to call it would be later today after he is finished seeing Pt's in clinic. [name] verbalized understanding.

## 2015-11-27 NOTE — Progress Notes (Signed)
Subjective:    Patient ID: [name], female    DOB: 08/29/1945, 70 y.o.   MRN: [id number]  DOS:  11/27/2015 Type of visit - description : Acute visit, incontinence Interval history: About a year history of urinary incontinence. Typically  what happens is that she feels the urgency to urinate, often times she leaks before she is able to reach the restroom; she has urinary urgency often. No problems at night. Very seldom has leaking with cough or laughing.    Review of Systems Denies fever or chills No further nausea, vomiting. No abdominal pain or flank pain. No actual dysuria, gross hematuria, difficulty urinating. Some urinary frequency. No vaginal discharge or bleeding.  Past Medical History:  Diagnosis Date  . Adenomatous colon polyp 03/1988  . Anemia    borderline  . Arthritis    R shoulder - degenerative   . Cataract    beginning stages  . Chest pain     Sept 2011: stress test neg  . Depression    sees Dr.Cotle  . Diabetes mellitus    dr Buddy Duty  . Diverticulosis   . Fatty liver    Increased LFTs, saw GI 06/2011, likely from fatty liver   . GERD (gastroesophageal reflux disease)   . History of hiatal hernia   . Hyperlipemia   . Hypertension   . IBS (irritable bowel syndrome)    and dyspepsia  . Memory impairment 08/2014   mild cognitive impairment vs. mild dementia, reommended reinstating of cholinesterase inhibitor   . Nausea and vomiting    on and off for weeks  . Osteopenia    dexa 06/2007 and 10/11 Rx CA vitamin d  . RLS (restless legs syndrome)    h/o- no longer using Requip    Past Surgical History:  Procedure Laterality Date  . BREAST BIOPSY Left 10/11/2015   fibroadenoma w/ calcifications, no evidence of malignancy, recommend mammogram repeat-6 mnths  . POLYPECTOMY    . REVERSE SHOULDER ARTHROPLASTY Right 04/21/2014   Procedure: REVERSE SHOULDER ARTHROPLASTY;  Surgeon: Tania Ade, MD;  Location: [facility];  Service: Orthopedics;  Laterality:  Right;  Right reverse total shoulder replacement  . TONSILLECTOMY    . UTERINE FIBROID EMBOLIZATION     90s    Social History   Social History  . Marital status: Widowed    Spouse name: [name]  . Number of children: 3  . Years of education: N/A   Occupational History  . 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 Smoking status: Former Smoker    Packs/day: 2.00    Years: 10.00    Types: Cigarettes    Quit date: 11/26/1975  . Smokeless tobacco: Never Used     Comment: used to smoke 2 ppd  . Alcohol use 0.0 oz/week     Comment: rarely  . Drug use: No  . Sexual activity: Not on file   Other Topics Concern  . Not on file   Social History Narrative   Lives w/  [name]   2 g-child, twins on the way        Medication List       Accurate as of 11/27/15 11:59 PM. Always use your most recent med list.          acetaminophen 500 MG tablet Commonly known as:  TYLENOL Take 500 mg by mouth every 6 (six) hours as needed.   ARICEPT 10 MG  tablet Generic drug:  donepezil Take 2 tablets (20 mg total) by mouth at bedtime.   aspirin 81 MG tablet Take 1 tablet (81 mg total) by mouth daily.   atenolol 50 MG tablet Commonly known as:  TENORMIN Take 0.5 tablets (25 mg total) by mouth daily.   atorvastatin 40 MG tablet Commonly known as:  LIPITOR Take 1 tablet (40 mg total) by mouth daily.   B-D ULTRAFINE III SHORT PEN 31G X 8 MM Misc Generic drug:  Insulin Pen Needle USE AS DIRECTED   darifenacin 7.5 MG 24 hr tablet Commonly known as:  ENABLEX Take 1 tablet (7.5 mg total) by mouth daily.   Ferrous Fumarate-Vitamin C ER 65-25 MG Tbcr Commonly known as:  FERRO-SEQUELS Take 1 tablet by mouth daily.   glucose blood test strip Commonly known as:  ONE TOUCH ULTRA TEST Check twice a day   LEVEMIR FLEXTOUCH 100 UNIT/ML Pen Generic drug:  Insulin Detemir Inject 80 Units into the skin daily.   losartan 25 MG tablet Commonly known  as:  COZAAR Take 1 tablet (25 mg total) by mouth every evening.   metFORMIN 1000 MG tablet Commonly known as:  GLUCOPHAGE Take 1 tablet (1,000 mg total) by mouth 2 (two) times daily with a meal.   omeprazole 40 MG capsule Commonly known as:  PRILOSEC Take 1 capsule (40 mg total) by mouth daily.   TRINTELLIX 20 MG Tabs Generic drug:  vortioxetine HBr Take 1 tablet by mouth daily.   vitamin B-12 1000 MCG tablet Commonly known as:  CYANOCOBALAMIN Take 1 tablet (1,000 mcg total) by mouth daily.          Objective:   Physical Exam BP 132/78 (BP Location: Left Arm, Patient Position: Sitting, Cuff Size: Normal)   Pulse 68   Temp 97.8 F (36.6 C) (Oral)   Resp 12   Ht 5\' 6"  (1.676 m)   Wt 189 lb 8 oz (86 kg)   SpO2 98%   BMI 30.59 kg/m  General:   Well developed, well nourished . NAD.  HEENT:  Normocephalic . Face symmetric, atraumatic Abdomen:  Not distended, soft, non-tender. No rebound or rigidity. No CVA tenderness Skin: Not pale. Not jaundice Neurologic:  alert & oriented X3.  Speech normal, gait appropriate for age and unassisted Psych--  Cognition and judgment appear intact.  Cooperative with normal attention span and concentration.  Behavior appropriate. No anxious or depressed appearing.    Assessment & Plan:   Assessment  DM Dr. Buddy Duty HTN Hyperlipidemia Depression Dr. Clovis Pu OSA -- mild, saw Dr Gwenette Greet 2014, no CPAP GI: --IBS, colon polyps, diverticulosis, hiatal hernia --Chronic constipation likely part of her IBS syndrome  --Fatty liver >>> GI eval 2013 --iron fec anemia:  cscope 04-2014 : 2 polyps; + hemocult @ GI office 10-2014: EGD done (-), bx neg Osteopenia: T score -1.1  2009 , osteopenia again per dexa 05-2013 , rx ca-vit d B12 deficiency  RLS MSK: --DJD frozen shoulder Mild cognitive impairment  MMSE 2015 --> 28, on Aricept, last visit w/ neuro, Dr Everette Rank 02-2015  Dizziness: chronic, carotid US 2016 neg, saw cards-- not likely CV related;  saw neuro DR Everette Rank 02-2015  Chest pain 09-2009, negative stress test Thyroid nodules: Incidental   by Carotid US, BX 11-2014: Atypical findings, [facility]da III, f/u  Endocrine  PLAN: Urgency incontinence: Rec avoidance of caffeine and alcohol, check a UA urine culture, trial with Enablex 7.5 mg (no interaction noted with her other medications) watch for s/e  ie excessive somnolence, printed information regarding this medication from [facility] provided. Will discuss treatment with the patient's daughter per patient request Vomiting with minimal nausea: Symptoms largely resolved  RTC 01-2016 as a schedule

## 2015-11-28 LAB — URINALYSIS, ROUTINE W REFLEX MICROSCOPIC
Bilirubin Urine: NEGATIVE
Hgb urine dipstick: NEGATIVE
Ketones, ur: NEGATIVE
Leukocytes, UA: NEGATIVE
Nitrite: NEGATIVE
RBC / HPF: NONE SEEN (ref 0–?)
Specific Gravity, Urine: <=1.005 — AB (ref 1.000–1.030)
Total Protein, Urine: NEGATIVE
Urine Glucose: NEGATIVE
Urobilinogen, UA: 0.2 (ref 0.0–1.0)
WBC, UA: NONE SEEN (ref 0–?)
pH: 5.5 (ref 5.0–8.0)

## 2015-11-28 LAB — URINE CULTURE: Organism ID, Bacteria: NO GROWTH

## 2015-11-28 NOTE — Assessment & Plan Note (Signed)
Urgency incontinence: Rec avoidance of caffeine and alcohol, check a UA urine culture, trial with Enablex 7.5 mg (no interaction noted with her other medications) watch for s/e ie excessive somnolence, printed information regarding this medication from [facility] provided. Will discuss treatment with the patient's daughter per patient request Vomiting with minimal nausea: Symptoms largely resolved  RTC 01-2016 as a schedule

## 2015-12-19 ENCOUNTER — Ambulatory Visit: Payer: 59 | Admitting: Psychology

## 2015-12-25 ENCOUNTER — Other Ambulatory Visit: Payer: Self-pay | Admitting: Gastroenterology

## 2015-12-25 DIAGNOSIS — R112 Nausea with vomiting, unspecified: Secondary | ICD-10-CM

## 2015-12-28 ENCOUNTER — Telehealth: Payer: Self-pay

## 2015-12-28 NOTE — Telephone Encounter (Signed)
Received [facility] Application form via fax for Pt. Form forwarded to PCP for completion.

## 2015-12-29 NOTE — Telephone Encounter (Signed)
done

## 2015-12-29 NOTE — Telephone Encounter (Signed)
Received fax confirmation on 12/29/2015 at 0914.

## 2015-12-29 NOTE — Telephone Encounter (Signed)
Completed form faxed to ATTN: Leroy Sea w/ [facility] at [phone]. Form sent for scanning.

## 2016-01-02 ENCOUNTER — Ambulatory Visit (INDEPENDENT_AMBULATORY_CARE_PROVIDER_SITE_OTHER): Payer: 59 | Admitting: Psychology

## 2016-01-02 DIAGNOSIS — F331 Major depressive disorder, recurrent, moderate: Secondary | ICD-10-CM

## 2016-01-03 NOTE — Telephone Encounter (Signed)
Patient's daughter called stating that [facility] did not receive the fax. She is requesting that the form is refaxed. Please advise   Fax: [phone]

## 2016-01-04 NOTE — Telephone Encounter (Signed)
Forms sent for scanning. Fax confirmation was received on 12/29/2015 at 0914 to number [phone]. Will have to wait for forms to be scanned in by batch center, which can take several weeks.

## 2016-01-04 NOTE — Telephone Encounter (Signed)
Patient is aware 

## 2016-01-11 ENCOUNTER — Telehealth: Payer: Self-pay | Admitting: *Deleted

## 2016-01-11 NOTE — Telephone Encounter (Signed)
AWV 01/12/16 @10 .

## 2016-01-11 NOTE — Telephone Encounter (Signed)
[facility] Paperwork scanned to Pt's chart. Forms re-faxed to [phone]. Spoke w/ Pt, informed that forms have been re-faxed. Pt verbalized understanding.

## 2016-01-12 ENCOUNTER — Encounter: Payer: Self-pay | Admitting: Internal Medicine

## 2016-01-12 ENCOUNTER — Ambulatory Visit (INDEPENDENT_AMBULATORY_CARE_PROVIDER_SITE_OTHER): Payer: Medicare Other | Admitting: Internal Medicine

## 2016-01-12 ENCOUNTER — Ambulatory Visit: Payer: Self-pay | Admitting: *Deleted

## 2016-01-12 VITALS — BP 126/74 | HR 84 | Temp 97.8°F | Resp 14 | Ht 66.0 in | Wt 187.5 lb

## 2016-01-12 DIAGNOSIS — Z0000 Encounter for general adult medical examination without abnormal findings: Secondary | ICD-10-CM

## 2016-01-12 LAB — LIPID PANEL
Cholesterol: 129 mg/dL (ref 0–200)
HDL: 47.6 mg/dL (ref >39.00)
LDL Cholesterol: 47 mg/dL (ref 0–99)
NonHDL: 81.45
Total CHOL/HDL Ratio: 3
Triglycerides: 173 mg/dL — ABNORMAL HIGH (ref 0.0–149.0)
VLDL: 34.6 mg/dL (ref 0.0–40.0)

## 2016-01-12 LAB — BASIC METABOLIC PANEL
BUN: 9 mg/dL (ref 6–23)
CO2: 28 mEq/L (ref 19–32)
Calcium: 9.5 mg/dL (ref 8.4–10.5)
Chloride: 101 mEq/L (ref 96–112)
Creatinine, Ser: 0.67 mg/dL (ref 0.40–1.20)
GFR: 92.25 mL/min (ref >60.00)
Glucose, Bld: 114 mg/dL — ABNORMAL HIGH (ref 70–99)
Potassium: 3.7 mEq/L (ref 3.5–5.1)
Sodium: 139 mEq/L (ref 135–145)

## 2016-01-12 MED ORDER — DARIFENACIN HYDROBROMIDE ER 15 MG PO TB24: 15.0000 mg | ORAL_TABLET | Freq: Every day | ORAL | 6 refills | Status: DC

## 2016-01-12 NOTE — Assessment & Plan Note (Signed)
Td-- 2014; pneumovax 2009-2014;  prevnar-- 12-2013;  shingles shot 2010; had a Flu shot    No recent visit w/  Gynecology, no h/o abnormal paps last mammogram 10-2015, had a BX---> (-) FH Colon cancer, Cscope : 04/18/2009, cscope again 04-2014: +polyps Diet-exercise discussed

## 2016-01-12 NOTE — Progress Notes (Signed)
Subjective:    Patient ID: [name], female    DOB: May 20, 1945, 71 y.o.   MRN: [id number]  DOS:  01/12/2016 Type of visit - description : CPX and Medicare wellness Interval history: General feeling well, no major concerns. Good medication compliance.   Review of Systems  Constitutional: No fever. No chills. No unexplained wt changes. No unusual sweats  HEENT: No dental problems, no ear discharge, no facial swelling, no voice changes. No eye discharge, no eye  redness , no  intolerance to light   Respiratory: No wheezing , no  difficulty breathing. No cough , no mucus production  Cardiovascular: No CP, no leg swelling , no  Palpitations  GI: no nausea, no vomiting, no diarrhea , no  abdominal pain.  No blood in the stools. No dysphagia, no odynophagia    Endocrine: No polyphagia, no polyuria , no polydipsia  GU:  Still has some urinary urgency and leaking. Increase enablex dose?Marland Kitchen No dysuria, gross hematuria, difficulty urinating.    Musculoskeletal: No joint swellings or unusual aches or pains  Skin: No change in the color of the skin, palor , no  Rash  Allergic, immunologic: No environmental allergies , no  food allergies  Neurological: No dizziness no  syncope. No headaches. No diplopia, no slurred, no slurred speech, no motor deficits, no facial  Numbness  Hematological: No enlarged lymph nodes, no easy bruising , no unusual bleedings  Psychiatry: No suicidal ideas, no hallucinations, no beavior problems, no confusion.  Good compliance with psych medications. Feeling well emotionally   Past Medical History:  Diagnosis Date  . Adenomatous colon polyp 03/1988  . Anemia    borderline  . Arthritis    R shoulder - degenerative   . Cataract    beginning stages  . Chest pain     Sept 2011: stress test neg  . Depression    sees Dr.Cotle  . Diabetes mellitus    dr Buddy Duty  . Diverticulosis   . Fatty liver    Increased LFTs, saw GI 06/2011, likely from fatty  liver   . GERD (gastroesophageal reflux disease)   . History of hiatal hernia   . Hyperlipemia   . Hypertension   . IBS (irritable bowel syndrome)    and dyspepsia  . Memory impairment 08/2014   mild cognitive impairment vs. mild dementia, reommended reinstating of cholinesterase inhibitor   . Nausea and vomiting    on and off for weeks  . Osteopenia    dexa 06/2007 and 10/11 Rx CA vitamin d  . RLS (restless legs syndrome)    h/o- no longer using Requip    Past Surgical History:  Procedure Laterality Date  . BREAST BIOPSY Left 10/11/2015   fibroadenoma w/ calcifications, no evidence of malignancy, recommend mammogram repeat-6 mnths  . POLYPECTOMY    . REVERSE SHOULDER ARTHROPLASTY Right 04/21/2014   Procedure: REVERSE SHOULDER ARTHROPLASTY;  Surgeon: Tania Ade, MD;  Location: [facility];  Service: Orthopedics;  Laterality: Right;  Right reverse total shoulder replacement  . TONSILLECTOMY    . UTERINE FIBROID EMBOLIZATION     90s    Social History   Social History  . Marital status: Widowed    Spouse name: [name]  . Number of children: 3  . Years of education: N/A   Occupational History  . 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 Smoking status: Former Smoker    Packs/day:  2.00    Years: 10.00    Types: Cigarettes    Quit date: 11/26/1975  . Smokeless tobacco: Never Used     Comment: used to smoke 2 ppd  . Alcohol use 0.0 oz/week     Comment: rarely  . Drug use: No  . Sexual activity: Not on file   Other Topics Concern  . Not on file   Social History Narrative   Lives w/  [name]   2 g-child, twins on the way     Family History  Problem Relation Age of Onset  . Lung cancer Mother     smoker  . Colon cancer Father     F dx in his 4s  . Ovarian cancer Paternal Aunt     ?  . Diabetes Other     aunts-uncles   . Heart attack Other     GM in her 105s  . Stroke Other     aunts-uncles   . Breast cancer Neg Hx   .  Rectal cancer Neg Hx   . Stomach cancer Neg Hx   . Esophageal cancer Neg Hx      Allergies as of 01/12/2016      Reactions   Sulfa Antibiotics Nausea Only   Trulicity [dulaglutide] Nausea Only   Penicillins Rash      Medication List       Accurate as of 01/12/16  9:19 AM. Always use your most recent med list.          acetaminophen 500 MG tablet Commonly known as:  TYLENOL Take 500 mg by mouth every 6 (six) hours as needed.   ARICEPT 10 MG tablet Generic drug:  donepezil Take 2 tablets (20 mg total) by mouth at bedtime.   aspirin 81 MG tablet Take 1 tablet (81 mg total) by mouth daily.   atenolol 50 MG tablet Commonly known as:  TENORMIN Take 0.5 tablets (25 mg total) by mouth daily.   atorvastatin 40 MG tablet Commonly known as:  LIPITOR Take 1 tablet (40 mg total) by mouth daily.   B-D ULTRAFINE III SHORT PEN 31G X 8 MM Misc Generic drug:  Insulin Pen Needle USE AS DIRECTED   darifenacin 7.5 MG 24 hr tablet Commonly known as:  ENABLEX Take 1 tablet (7.5 mg total) by mouth daily.   Ferrous Fumarate-Vitamin C ER 65-25 MG Tbcr Commonly known as:  FERRO-SEQUELS Take 1 tablet by mouth daily.   glucose blood test strip Commonly known as:  ONE TOUCH ULTRA TEST Check twice a day   LEVEMIR FLEXTOUCH 100 UNIT/ML Pen Generic drug:  Insulin Detemir Inject 80 Units into the skin daily.   losartan 25 MG tablet Commonly known as:  COZAAR Take 1 tablet (25 mg total) by mouth every evening.   metFORMIN 1000 MG tablet Commonly known as:  GLUCOPHAGE Take 1 tablet (1,000 mg total) by mouth 2 (two) times daily with a meal.   omeprazole 40 MG capsule Commonly known as:  PRILOSEC Take 1 capsule (40 mg total) by mouth daily.   TRINTELLIX 20 MG Tabs Generic drug:  vortioxetine HBr Take 1 tablet by mouth daily.   vitamin B-12 1000 MCG tablet Commonly known as:  CYANOCOBALAMIN Take 1 tablet (1,000 mcg total) by mouth daily.          Objective:   Physical  Exam BP 126/74 (BP Location: Left Arm, Patient Position: Sitting, Cuff Size: Normal)   Pulse 84   Temp 97.8 F (36.6 C) (Oral)  Resp 14   Ht 5\' 6"  (1.676 m)   Wt 187 lb 8 oz (85 kg)   SpO2 96%   BMI 30.26 kg/m   General:   Well developed, well nourished . NAD.  Neck: No  thyromegaly  HEENT:  Normocephalic . Face symmetric, atraumatic Lungs:  CTA B Normal respiratory effort, no intercostal retractions, no accessory muscle use. Heart: RRR,  no murmur.  No pretibial edema bilaterally  Abdomen:  Not distended, soft, non-tender. No rebound or rigidity.   Skin: Exposed areas without rash. Not pale. Not jaundice Neurologic:  alert & oriented to self, space and time.Marland Kitchen  Speech normal, gait appropriate for age and unassisted Strength symmetric and appropriate for age.  Psych: Cognition and judgment appear intact.  Cooperative with normal attention span and concentration.  Behavior appropriate. No anxious or depressed appearing.    Assessment & Plan:   Assessment  DM Dr. Buddy Duty HTN Hyperlipidemia Depression Dr. Clovis Pu OSA -- mild, saw Dr Gwenette Greet 2014, no CPAP GI: --IBS, colon polyps, diverticulosis, hiatal hernia --Chronic constipation likely part of her IBS syndrome  --Fatty liver >>> GI eval 2013 --iron fec anemia:  cscope 04-2014 : 2 polyps; + hemocult @ GI office 10-2014: EGD done (-), bx neg Osteopenia: T score -1.1  2009 , osteopenia again per dexa 05-2013 , rx ca-vit d B12 deficiency  RLS MSK: --DJD frozen shoulder Mild cognitive impairment  MMSE 2015 --> 28, on Aricept,  sees Dr Everette Rank  Dizziness: chronic, carotid US 2016 neg, saw cards-- not likely CV related; saw neuro DR Everette Rank 02-2015  Chest pain 09-2009, negative stress test Thyroid nodules: Incidental   by Carotid US, BX 11-2014: Atypical findings, Bethesda III, f/u  Endocrine  PLAN: HTN: Normal amb BPs with losartan Tenormin. No change, check labs Hyperlipidemia: On Lipitor, check labs Iron deficiency anemia:  Last CBC satisfactory, stop iron. B12 deficiency: Last lab satisfactory Mild cognitive impairment: Saw neuro 11-2015, noted to be worse. She is still very functional, lives by herself, drives, is A, Ox3 today. Urinary incontinence: See previous visit, enablex  7.5 mg helps but pt likes to  increase the dose. Plan: Increase to 15 mg daily, timing void also recommended RTC 6-8 months

## 2016-01-12 NOTE — Telephone Encounter (Signed)
Received fax confirmation 01/12/2016 0748.

## 2016-01-12 NOTE — Progress Notes (Signed)
Subjective:   [name] is a 71 y.o. female who presents for Medicare Annual (Subsequent) preventive examination.  Review of Systems:  No ROS.  Medicare Wellness Visit. Cardiac Risk Factors include: advanced age (>62men, >8 women);diabetes mellitus;hypertension;obesity (BMI >30kg/m2) Sleep patterns: Sleeps about 6-7 hrs per night. Feels rested.  Home Safety/Smoke Alarms: Oceanographer and security system.   Living environment; residence and Firearm Safety: Lives alone in 1 story home. No guns. Feels safe.  Seat Belt Safety/Bike Helmet: Wears seat belt.   Counseling:   Eye Exam- Dr.Jon Scott every year for glasses and eye exams. Dental- Dr.Odeh every 6 months.   Female:   Pap- Dr. Pamala Hurry (GYN). Pt states all paps have been normal.     Mammo- Last on 10/11/15  Bi-Rads Category 4: suspicious. Pt states biopsy was performed and it was negative.   Dexa scan- Last 10/05/15: Osteopenia.        CCS- Last 04/15/14: Polyps removed were precancerous. Repeat screening in 5 yr per report.   Objective:     Vitals: BP 126/74 (BP Location: Left Arm, Patient Position: Sitting, Cuff Size: Normal)   Pulse 84   Temp 97.8 F (36.6 C) (Oral)   Resp 14   Ht 5\' 6"  (1.676 m)   Wt 187 lb 8 oz (85 kg)   SpO2 96%   BMI 30.26 kg/m   Body mass index is 30.26 kg/m.   Tobacco History  Smoking Status  . Former Smoker  . Packs/day: 2.00  . Years: 10.00  . Types: Cigarettes  . Quit date: 11/26/1975  Smokeless Tobacco  . Never Used    Comment: used to smoke 2 ppd     Counseling given: No   Past Medical History:  Diagnosis Date  . Adenomatous colon polyp 03/1988  . Anemia    borderline  . Arthritis    R shoulder - degenerative   . Cataract    beginning stages  . Chest pain     Sept 2011: stress test neg  . Depression    sees Dr.Cotle  . Diabetes mellitus    dr Buddy Duty  . Diverticulosis   . Fatty liver    Increased LFTs, saw GI 06/2011, likely from fatty liver   . GERD  (gastroesophageal reflux disease)   . History of hiatal hernia   . Hyperlipemia   . Hypertension   . IBS (irritable bowel syndrome)    and dyspepsia  . Memory impairment 08/2014   mild cognitive impairment vs. mild dementia, reommended reinstating of cholinesterase inhibitor   . Nausea and vomiting    on and off for weeks  . Osteopenia    dexa 06/2007 and 10/11 Rx CA vitamin d  . RLS (restless legs syndrome)    h/o- no longer using Requip   Past Surgical History:  Procedure Laterality Date  . BREAST BIOPSY Left 10/11/2015   fibroadenoma w/ calcifications, no evidence of malignancy, recommend mammogram repeat-6 mnths  . POLYPECTOMY    . REVERSE SHOULDER ARTHROPLASTY Right 04/21/2014   Procedure: REVERSE SHOULDER ARTHROPLASTY;  Surgeon: Tania Ade, MD;  Location: [facility];  Service: Orthopedics;  Laterality: Right;  Right reverse total shoulder replacement  . TONSILLECTOMY    . UTERINE FIBROID EMBOLIZATION     90s   Family History  Problem Relation Age of Onset  . Lung cancer Mother     smoker  . Colon cancer Father     F dx in his 35s  . Ovarian cancer  Paternal Aunt     ?  . Diabetes Other     aunts-uncles   . Heart attack Other     GM in her 60s  . Stroke Other     aunts-uncles   . Breast cancer Neg Hx   . Rectal cancer Neg Hx   . Stomach cancer Neg Hx   . Esophageal cancer Neg Hx    History  Sexual Activity  . Sexual activity: Not Currently    Outpatient Encounter Prescriptions as of 01/12/2016  Medication Sig  . acetaminophen (TYLENOL) 500 MG tablet Take 500 mg by mouth every 6 (six) hours as needed.  Marland Kitchen aspirin 81 MG tablet Take 1 tablet (81 mg total) by mouth daily.  Marland Kitchen atenolol (TENORMIN) 50 MG tablet Take 0.5 tablets (25 mg total) by mouth daily.  Marland Kitchen atorvastatin (LIPITOR) 40 MG tablet Take 1 tablet (40 mg total) by mouth daily.  Marland Kitchen donepezil (ARICEPT) 10 MG tablet Take 2 tablets (20 mg total) by mouth at bedtime.  . Insulin Detemir (LEVEMIR FLEXTOUCH) 100  UNIT/ML Pen Inject 80 Units into the skin daily.   Marland Kitchen losartan (COZAAR) 25 MG tablet Take 1 tablet (25 mg total) by mouth every evening.  . metFORMIN (GLUCOPHAGE) 1000 MG tablet Take 1 tablet (1,000 mg total) by mouth 2 (two) times daily with a meal.  . omeprazole (PRILOSEC) 40 MG capsule Take 1 capsule (40 mg total) by mouth daily.  . vitamin B-12 (CYANOCOBALAMIN) 1000 MCG tablet Take 1 tablet (1,000 mcg total) by mouth daily.  Marland Kitchen vortioxetine HBr (TRINTELLIX) 20 MG TABS Take 1 tablet by mouth daily.  . [DISCONTINUED] darifenacin (ENABLEX) 7.5 MG 24 hr tablet Take 1 tablet (7.5 mg total) by mouth daily.  . [DISCONTINUED] Ferrous Fumarate-Vitamin C ER (FERRO-SEQUELS) 65-25 MG TBCR Take 1 tablet by mouth daily.  . B-D ULTRAFINE III SHORT PEN 31G X 8 MM MISC USE AS DIRECTED (Patient not taking: Reported on 01/12/2016)  . darifenacin (ENABLEX) 15 MG 24 hr tablet Take 1 tablet (15 mg total) by mouth daily.  Marland Kitchen FERRO-SEQUELS 65-25 MG TBCR Take 1 tablet by mouth daily.  Marland Kitchen glucose blood (ONE TOUCH ULTRA TEST) test strip Check twice a day (Patient not taking: Reported on 01/12/2016)  . [DISCONTINUED] darifenacin (ENABLEX) 7.5 MG 24 hr tablet Take 1 tablet (7.5 mg total) by mouth daily.  . [DISCONTINUED] omeprazole (PRILOSEC) 40 MG capsule TAKE 1 CAPSULE BY MOUTH 2 TIMES DAILY   Facility-Administered Encounter Medications as of 01/12/2016  Medication  . 0.9 %  sodium chloride infusion    Activities of Daily Living In your present state of health, do you have any difficulty performing the following activities: 01/12/2016 09/18/2015  Hearing? N N  Vision? N N  Difficulty concentrating or making decisions? N Y  Walking or climbing stairs? N N  Dressing or bathing? N N  Doing errands, shopping? N N  Preparing Food and eating ? N -  Using the Toilet? N -  In the past six months, have you accidently leaked urine? Y -  Do you have problems with loss of bowel control? N -  Managing your Medications? N -    Managing your Finances? Y -  Housekeeping or managing your Housekeeping? N -  Some recent data might be hidden    Patient Care Team: Colon Branch, MD as PCP - [place], DPM as Consulting Physician (Podiatry) Karl Luke, MD as Referring Physician (Neurology) Delrae Rend, MD as Consulting Physician (  Endocrinology) Macarthur Critchley, OD as Referring Physician (Optometry) Norma Fredrickson, MD as Consulting Physician (Psychiatry)    Assessment:    Physical assessment deferred to PCP.  Exercise Activities and Dietary recommendations Current Exercise Habits: The patient does not participate in regular exercise at present (States restarting silver sneakers), Exercise limited by: None identified  Diet: 24 HOUR RECALL Breakfast: Bar with diet coke Lunch: [employer]: protein, vegetable, and starch. Drinks about 4-5 glasses of water per day. Fruit or cookies for snacks.  Goals    . Begin doing silver sneakers 3x/week.    . Start bible study group.      Fall Risk Fall Risk  01/12/2016 09/18/2015 09/21/2014 12/24/2013 11/30/2012  Falls in the past year? No No No No No   Depression Screen PHQ 2/9 Scores 01/12/2016 09/18/2015 09/21/2014 12/24/2013  PHQ - 2 Score 0 0 0 0     Cognitive Function MMSE - Mini Mental State Exam 01/12/2016  Orientation to time 5  Orientation to Place 5  Registration 3  Attention/ Calculation 5  Recall 3  Language- name 2 objects 2  Language- repeat 1  Language- follow 3 step command 3  Language- read & follow direction 1  Write a sentence 1  Copy design 1  Total score 30        Immunization History  Administered Date(s) Administered  . Influenza Split 11/26/2010, 11/28/2011, 10/26/2013  . Influenza, High Dose Seasonal PF 09/21/2014, 09/18/2015  . Influenza,inj,Quad PF,36+ Mos 10/27/2012  . Pneumococcal Conjugate-13 12/24/2013  . Pneumococcal Polysaccharide-23 06/09/2007, 11/30/2012  . Tetanus 11/30/2012  . Zoster 03/24/2008   Screening  Tests Health Maintenance  Topic Date Due  . OPHTHALMOLOGY EXAM  11/18/2015  . HEMOGLOBIN A1C  02/02/2016  . FOOT EXAM  08/01/2016  . MAMMOGRAM  10/10/2016  . COLONOSCOPY  04/15/2019  . TETANUS/TDAP  12/01/2022  . INFLUENZA VACCINE  Completed  . DEXA SCAN  Completed  . ZOSTAVAX  Completed  . Hepatitis C Screening  Completed  . PNA vac Low Risk Adult  Completed      Plan:     Continue to eat heart healthy diet (full of fruits, vegetables, whole grains, lean protein, water--limit salt, fat, and sugar intake) and increase physical activity as tolerated. Continue doing brain stimulating activities (puzzles, reading, adult coloring books, staying active) to keep memory sharp.  Bring a copy of your advance directives to your next office visit. Have someone check smoke detectors in your home since you have not tested them recently.  During the course of the visit the patient was educated and counseled about the following appropriate screening and preventive services:   Vaccines to include Pneumoccal, Influenza, Hepatitis B, Td, Zostavax, HCV  Cardiovascular Disease  Colorectal cancer screening  Bone density screening  Diabetes screening  Glaucoma screening  Mammography/PAP  Nutrition counseling   Patient Instructions (the written plan) was given to the patient.   [name], [place]  01/12/2016  Kathlene November, MD

## 2016-01-12 NOTE — Patient Instructions (Addendum)
GO TO THE LAB : Get the blood work     GO TO THE FRONT DESK Schedule your next appointment for a  routine checkup with me in 6 months  Okay to stop iron  Increase  Enablex to 15 mg daily, watch for side effects   Continue to eat heart healthy diet (full of fruits, vegetables, whole grains, lean protein, water--limit salt, fat, and sugar intake) and increase physical activity as tolerated. Continue doing brain stimulating activities (puzzles, reading, adult coloring books, staying active) to keep memory sharp.  Bring a copy of your advance directives to your next office visit. Have someone check smoke detectors in your home since you have not tested them recently.

## 2016-01-13 NOTE — Assessment & Plan Note (Signed)
HTN: Normal amb BPs with losartan Tenormin. No change, check labs Hyperlipidemia: On Lipitor, check labs Iron deficiency anemia: Last CBC satisfactory, stop iron. B12 deficiency: Last lab satisfactory Mild cognitive impairment: Saw neuro 11-2015, noted to be worse. She is still very functional, lives by herself, drives, is A, Ox3 today. Urinary incontinence: See previous visit, enablex  7.5 mg helps but pt likes to  increase the dose. Plan: Increase to 15 mg daily, timing void also recommended RTC 6-8 months

## 2016-01-17 ENCOUNTER — Ambulatory Visit (INDEPENDENT_AMBULATORY_CARE_PROVIDER_SITE_OTHER): Payer: 59 | Admitting: Psychology

## 2016-01-17 DIAGNOSIS — F331 Major depressive disorder, recurrent, moderate: Secondary | ICD-10-CM | POA: Diagnosis not present

## 2016-01-22 ENCOUNTER — Telehealth: Payer: Self-pay | Admitting: *Deleted

## 2016-01-22 NOTE — Telephone Encounter (Signed)
Pt called in. She says that she just had wellness visit on 01/12/16 when she had her cpe with PCP

## 2016-01-22 NOTE — Telephone Encounter (Signed)
Called patient and left message to return call

## 2016-01-29 ENCOUNTER — Other Ambulatory Visit: Payer: Self-pay | Admitting: Internal Medicine

## 2016-02-01 ENCOUNTER — Ambulatory Visit: Payer: 59 | Admitting: Psychology

## 2016-02-06 ENCOUNTER — Ambulatory Visit: Payer: Self-pay | Admitting: Internal Medicine

## 2016-02-15 ENCOUNTER — Ambulatory Visit (INDEPENDENT_AMBULATORY_CARE_PROVIDER_SITE_OTHER): Payer: 59 | Admitting: Psychology

## 2016-02-15 DIAGNOSIS — F331 Major depressive disorder, recurrent, moderate: Secondary | ICD-10-CM | POA: Diagnosis not present

## 2016-02-22 ENCOUNTER — Ambulatory Visit (INDEPENDENT_AMBULATORY_CARE_PROVIDER_SITE_OTHER): Payer: Medicare Other | Admitting: Podiatry

## 2016-02-22 ENCOUNTER — Encounter: Payer: Self-pay | Admitting: Podiatry

## 2016-02-22 DIAGNOSIS — M79676 Pain in unspecified toe(s): Secondary | ICD-10-CM | POA: Diagnosis not present

## 2016-02-22 DIAGNOSIS — B351 Tinea unguium: Secondary | ICD-10-CM | POA: Diagnosis not present

## 2016-02-22 DIAGNOSIS — Q828 Other specified congenital malformations of skin: Secondary | ICD-10-CM

## 2016-02-22 NOTE — Progress Notes (Signed)
She presents today with a chief complaint of painful elongated toenails which are rated now margins and multiple calluses. She states that she is a type II diabetic that is stable.  Objective: Vital signs are stable alert and oriented 3. Pulses are palpable. Neurologic sensorium is intact but slightly diminished.. Deep tendon reflexes are intact toenails and thick yellow dystrophic onychomycotic multiple porokeratotic lesions plantar aspect bilateral foot.  Assessment: Pain limb secondary to onychomycosis porokeratosis.  Plan: Debridement of all reactive hyperkeratotic tissue debridement of toenails 1 through 5 bilateral.

## 2016-02-27 ENCOUNTER — Ambulatory Visit: Payer: Self-pay | Admitting: Internal Medicine

## 2016-02-28 ENCOUNTER — Other Ambulatory Visit: Payer: Self-pay | Admitting: Internal Medicine

## 2016-02-29 ENCOUNTER — Ambulatory Visit (INDEPENDENT_AMBULATORY_CARE_PROVIDER_SITE_OTHER): Payer: 59 | Admitting: Psychology

## 2016-02-29 ENCOUNTER — Emergency Department (HOSPITAL_COMMUNITY)
Admission: EM | Admit: 2016-02-29 | Discharge: 2016-02-29 | Disposition: A | Discharge status: Home / Self Care | Payer: Medicare Other | Attending: Emergency Medicine | Admitting: Emergency Medicine

## 2016-02-29 ENCOUNTER — Telehealth: Payer: Self-pay | Admitting: Internal Medicine

## 2016-02-29 ENCOUNTER — Ambulatory Visit: Payer: Self-pay | Admitting: Internal Medicine

## 2016-02-29 ENCOUNTER — Emergency Department (HOSPITAL_COMMUNITY): Payer: Medicare Other

## 2016-02-29 ENCOUNTER — Encounter (HOSPITAL_COMMUNITY): Payer: Self-pay | Admitting: Emergency Medicine

## 2016-02-29 DIAGNOSIS — Z794 Long term (current) use of insulin: Secondary | ICD-10-CM | POA: Insufficient documentation

## 2016-02-29 DIAGNOSIS — Z7982 Long term (current) use of aspirin: Secondary | ICD-10-CM | POA: Diagnosis not present

## 2016-02-29 DIAGNOSIS — Z87891 Personal history of nicotine dependence: Secondary | ICD-10-CM | POA: Diagnosis not present

## 2016-02-29 DIAGNOSIS — F331 Major depressive disorder, recurrent, moderate: Secondary | ICD-10-CM | POA: Diagnosis not present

## 2016-02-29 DIAGNOSIS — M79622 Pain in left upper arm: Secondary | ICD-10-CM | POA: Insufficient documentation

## 2016-02-29 DIAGNOSIS — I10 Essential (primary) hypertension: Secondary | ICD-10-CM | POA: Diagnosis not present

## 2016-02-29 DIAGNOSIS — E119 Type 2 diabetes mellitus without complications: Secondary | ICD-10-CM | POA: Insufficient documentation

## 2016-02-29 LAB — CBC
HCT: 34.1 % — ABNORMAL LOW (ref 36.0–46.0)
Hemoglobin: 11.5 g/dL — ABNORMAL LOW (ref 12.0–15.0)
MCH: 27.4 pg (ref 26.0–34.0)
MCHC: 33.7 g/dL (ref 30.0–36.0)
MCV: 81.2 fL (ref 78.0–100.0)
Platelets: 190 10*3/uL (ref 150–400)
RBC: 4.2 MIL/uL (ref 3.87–5.11)
RDW: 13.9 % (ref 11.5–15.5)
WBC: 7.9 10*3/uL (ref 4.0–10.5)

## 2016-02-29 LAB — BASIC METABOLIC PANEL
Anion gap: 9 (ref 5–15)
BUN: 8 mg/dL (ref 6–20)
CO2: 24 mmol/L (ref 22–32)
Calcium: 9.2 mg/dL (ref 8.9–10.3)
Chloride: 102 mmol/L (ref 101–111)
Creatinine, Ser: 0.87 mg/dL (ref 0.44–1.00)
GFR calc Af Amer: >60 mL/min (ref >60)
GFR calc non Af Amer: >60 mL/min (ref >60)
Glucose, Bld: 165 mg/dL — ABNORMAL HIGH (ref 65–99)
Potassium: 3.6 mmol/L (ref 3.5–5.1)
Sodium: 135 mmol/L (ref 135–145)

## 2016-02-29 LAB — I-STAT TROPONIN, ED
Troponin i, poc: 0.01 ng/mL (ref 0.00–0.08)
Troponin i, poc: 0 ng/mL (ref 0.00–0.08)

## 2016-02-29 MED ORDER — ACETAMINOPHEN 325 MG PO TABS
650.0000 mg | ORAL_TABLET | Freq: Once | ORAL | Status: AC
  Administered 2016-02-29: 650 mg via ORAL
  Filled 2016-02-29: qty 2

## 2016-02-29 MED ORDER — IBUPROFEN 200 MG PO TABS
600.0000 mg | ORAL_TABLET | Freq: Once | ORAL | Status: AC
  Administered 2016-02-29: 600 mg via ORAL
  Filled 2016-02-29: qty 3

## 2016-02-29 NOTE — Telephone Encounter (Signed)
[facility] Day - Client TELEPHONE ADVICE RECORD [facility] Patient Name: [name] DOB: 31-Jan-1945 Initial Comment Caller [name] [birth year] Experiencing left arm pain. Took 2 tylanol Nurse Assessment Nurse: Hammonds, RN, Lissa Date/Time (Eastern Time): 02/29/2016 3:52:33 PM Confirm and document reason for call. If symptomatic, describe symptoms. ---Caller states: Symptoms started this afternoon, Experiencing left arm pain. The pain is like an ache in wrist and hand, no chest pain. Alittle pain in shoulder. The pain is 5/10. Does the patient have any new or worsening symptoms? ---Yes Will a triage be completed? ---Yes Related visit to physician within the last 2 weeks? ---Yes Does the PT have any chronic conditions? (i.e. diabetes, asthma, etc.) ---YesList chronic conditions. ---Diabetes t1 Depression Bladder Aricept just memory. Is this a behavioral health or substance abuse call? ---No Guidelines Guideline Title Affirmed Question Affirmed Notes Shoulder Pain [1] Age > 40 AND [2] no obvious cause AND [3] pain even when not moving the arm (Exception: pain is clearly made worse by moving arm or bending neck) Final Disposition User Go to ED Now Hammonds, RN, Lissa Referrals [facility] - SPECIFY Disagree/Comply: Comply Call Id: [id number]

## 2016-02-29 NOTE — ED Notes (Signed)
Pt ambulated to the BR with steady gait.   

## 2016-02-29 NOTE — Telephone Encounter (Signed)
FYI: Pt at ED now. 

## 2016-02-29 NOTE — Telephone Encounter (Signed)
noted 

## 2016-02-29 NOTE — ED Triage Notes (Signed)
Patient states that she started having left arm pain that radiated to neck around 2 pm while driving.  Patient states that neck pain has since subsided but still having arm pain. Patient took 2 extra strength tylenol but states not helping.

## 2016-02-29 NOTE — ED Provider Notes (Signed)
[facility] DEPT Provider Note   CSN: [id number] Arrival date & time: 02/29/16  1650     History   Chief Complaint Chief Complaint  Patient presents with  . Arm Pain    left    HPI [name] is a 71 y.o. female.  HPI  71 year old presents with left arm pain that started around 2 PM. She was sitting on a couch when it started. It felt like an aching in her left upper arm. This seemed to somewhat go down her arm into her fingertips. There was some fingertip numbness. There was never weakness or heaviness in the arm. At one point it seemed to radiate up into her left lateral neck but this has resolved. Typically does not have neck pain and does not of neck pain now. No pain with range of motion of her arm or palpation of her arm. She states it feels like if her arm was massaged it would feel better. It feels like gets sore as if it has been worked out except there was been no recent trauma, exercise, and it does not hurt to move. No swelling. She took 2 Tylenol which at first did not help but currently while in the room she states her pain is almost gone. At its worst it was about a 6 or 7 out of 10 and now is about a 2 out of 10. There has not been any headache, chest pain or pressure, shortness of breath. No lower extremity symptoms. Nothing made the pain worse such as exertion or movement. No dyspnea.  Past Medical History:  Diagnosis Date  . Adenomatous colon polyp 03/1988  . Anemia    borderline  . Arthritis    R shoulder - degenerative   . Cataract    beginning stages  . Chest pain     Sept 2011: stress test neg  . Depression    sees Dr.Cotle  . Diabetes mellitus    dr Buddy Duty  . Diverticulosis   . Fatty liver    Increased LFTs, saw GI 06/2011, likely from fatty liver   . GERD (gastroesophageal reflux disease)   . History of hiatal hernia   . Hyperlipemia   . Hypertension   . IBS (irritable bowel syndrome)    and dyspepsia  . Memory impairment 08/2014   mild  cognitive impairment vs. mild dementia, reommended reinstating of cholinesterase inhibitor   . Nausea and vomiting    on and off for weeks  . Osteopenia    dexa 06/2007 and 10/11 Rx CA vitamin d  . RLS (restless legs syndrome)    h/o- no longer using Requip    Patient Active Problem List   Diagnosis Date Noted  . PCP Notes >>>>>>>>>>>>>>>>> 09/21/2014  . Otitis media 05/23/2014  . UTI (urinary tract infection) 04/29/2014  . Rotator cuff tear arthropathy 04/21/2014  . Stool color abnormal 02/11/2014  . Abnormal MRI, shoulder 12/01/2013  . 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 Cervical radiculitis 11/10/2013  . 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 Bursitis of right shoulder 08/19/2013  . Primary localized osteoarthrosis, lower leg 08/19/2013  . Vitamin B 12 deficiency 05/20/2013  . Counseling about travel 05/20/2013  . Cough 06/30/2012  . OSA (obstructive sleep apnea) 02/13/2012  . Pulmonary nodule, RUL per CT 01-2012 01/28/2012  . Wrist pain 11/28/2011  . Memory deficit   05/29/2011  . Fatigue 02/27/2011  . Fatty liver 02/27/2011  . Annual physical exam 11/26/2010  . Osteopenia 10/09/2007  .  COLONIC POLYPS 08/22/2006  . DYSPEPSIA 08/22/2006  . DM II (diabetes mellitus, type II), controlled ([place]) 05/19/2006  . Hyperlipidemia 05/19/2006  . Depression 05/19/2006  . SYNDROME, RESTLESS LEGS 05/19/2006  . Essential hypertension 05/19/2006  . IBS -- constipation 05/19/2006    Past Surgical History:  Procedure Laterality Date  . BREAST BIOPSY Left 10/11/2015   fibroadenoma w/ calcifications, no evidence of malignancy, recommend mammogram repeat-6 mnths  . POLYPECTOMY    . REVERSE SHOULDER ARTHROPLASTY Right 04/21/2014   Procedure: REVERSE SHOULDER ARTHROPLASTY;  Surgeon: Tania Ade, MD;  Location: [facility];  Service: Orthopedics;  Laterality: Right;  Right reverse total shoulder replacement  . TONSILLECTOMY    . UTERINE FIBROID EMBOLIZATION     90s    OB History    No  data available       Home Medications    Prior to Admission medications   Medication Sig Start Date End Date Taking? Authorizing Provider  acetaminophen (TYLENOL) 500 MG tablet Take 500-1,000 mg by mouth every 6 (six) hours as needed for mild pain, moderate pain or headache.    Yes Historical Provider, MD  aspirin EC 81 MG tablet Take 81 mg by mouth daily.    Yes Historical Provider, MD  atenolol (TENORMIN) 50 MG tablet Take 0.5 tablets (25 mg total) by mouth daily. 05/05/15  Yes Colon Branch, MD  atorvastatin (LIPITOR) 40 MG tablet Take 1 tablet (40 mg total) by mouth daily. Patient taking differently: Take 40 mg by mouth at bedtime.  01/29/16  Yes Colon Branch, MD  darifenacin (ENABLEX) 15 MG 24 hr tablet Take 1 tablet (15 mg total) by mouth daily. 01/12/16  Yes Colon Branch, MD  donepezil (ARICEPT) 10 MG tablet Take 2 tablets (20 mg total) by mouth at bedtime.   Yes Colon Branch, MD  FERRO-SEQUELS 65-25 MG TBCR Take 1 tablet by mouth daily.   Yes Historical Provider, MD  Insulin Detemir (LEVEMIR FLEXTOUCH) 100 UNIT/ML Pen Inject 80 Units into the skin at bedtime.    Yes Delrae Rend, MD  losartan (COZAAR) 25 MG tablet Take 1 tablet (25 mg total) by mouth every evening. Patient taking differently: Take 25 mg by mouth at bedtime.  10/25/15  Yes Colon Branch, MD  metFORMIN (GLUCOPHAGE) 1000 MG tablet Take 1 tablet (1,000 mg total) by mouth 2 (two) times daily with a meal. 07/27/13  Yes Philemon Kingdom, MD  omeprazole (PRILOSEC) 40 MG capsule Take 1 capsule (40 mg total) by mouth daily. Patient taking differently: Take 40 mg by mouth at bedtime.  09/07/15  Yes Ladene Artist, MD  sodium fluoride (PREVIDENT 5000 DRY MOUTH) 1.1 % GEL dental gel Place 1 application onto teeth at bedtime.   Yes Historical Provider, MD  vitamin B-12 (CYANOCOBALAMIN) 1000 MCG tablet Take 1 tablet (1,000 mcg total) by mouth daily. 08/24/15  Yes Colon Branch, MD  vortioxetine HBr (TRINTELLIX) 20 MG TABS Take 20 mg by mouth daily.     Yes Historical Provider, MD    Family History Family History  Problem Relation Age of Onset  . Lung cancer Mother     smoker  . Colon cancer Father     F dx in his 59s  . Ovarian cancer Paternal Aunt     ?  . Diabetes Other     aunts-uncles   . Heart attack Other     GM in her 53s  . Stroke Other     aunts-uncles   .  Breast cancer Neg Hx   . Rectal cancer Neg Hx   . Stomach cancer Neg Hx   . Esophageal cancer Neg Hx     Social History Social History  Substance Use Topics  . Smoking status: Former Smoker    Packs/day: 2.00    Years: 10.00    Types: Cigarettes    Quit date: 11/26/1975  . Smokeless tobacco: Never Used     Comment: used to smoke 2 ppd  . Alcohol use 0.0 oz/week     Comment: rarely     Allergies   Sulfa antibiotics; Trulicity [dulaglutide]; and Penicillins   Review of Systems Review of Systems  Respiratory: Negative for shortness of breath.   Cardiovascular: Negative for chest pain.  Musculoskeletal: Positive for myalgias. Negative for neck pain.  Neurological: Negative for weakness, numbness and headaches.  All other systems reviewed and are negative.    Physical Exam Updated Vital Signs BP 129/59   Pulse 61   Temp 99.1 F (37.3 C) (Oral)   Resp 13   Ht 5' 6.5" (1.689 m)   Wt 192 lb 7 oz (87.3 kg)   SpO2 99%   BMI 30.59 kg/m   Physical Exam  Constitutional: She is oriented to person, place, and time. She appears well-developed and well-nourished. No distress.  HENT:  Head: Normocephalic and atraumatic.  Right Ear: External ear normal.  Left Ear: External ear normal.  Nose: Nose normal.  Eyes: Right eye exhibits no discharge. Left eye exhibits no discharge.  Neck: Normal range of motion. Neck supple. No spinous process tenderness and no muscular tenderness present. No neck rigidity.  Cardiovascular: Normal rate, regular rhythm and normal heart sounds.   Pulses:      Radial pulses are 2+ on the right side, and 2+ on the left  side.  Pulmonary/Chest: Effort normal and breath sounds normal.  Abdominal: Soft. There is no tenderness.  Musculoskeletal:       Left shoulder: She exhibits normal range of motion, no tenderness and no swelling.       Left elbow: She exhibits normal range of motion and no swelling. No tenderness found.       Left wrist: She exhibits normal range of motion and no tenderness.       Left upper arm: She exhibits no tenderness, no bony tenderness and no swelling.       Left forearm: She exhibits no tenderness.  Neurological: She is alert and oriented to person, place, and time.  5/5 strength in BUE. Normal gross sensation  Skin: Skin is warm and dry. She is not diaphoretic.  Nursing note and vitals reviewed.    ED Treatments / Results  Labs (all labs ordered are listed, but only abnormal results are displayed) Labs Reviewed  BASIC METABOLIC PANEL - Abnormal; Notable for the following:       Result Value   Glucose, Bld 165 (*)    All other components within normal limits  CBC - Abnormal; Notable for the following:    Hemoglobin 11.5 (*)    HCT 34.1 (*)    All other components within normal limits  I-STAT TROPOININ, ED  I-STAT TROPOININ, ED    EKG  EKG Interpretation  Date/Time:  Thursday February 29 2016 17:03:24 EST Ventricular Rate:  61 PR Interval:    QRS Duration: 96 QT Interval:  455 QTC Calculation: 459 R Axis:   -51 Text Interpretation:  Sinus rhythm Left anterior fascicular block Low voltage, precordial leads Abnormal R-wave  progression, early transition Baseline wander in lead(s) V3 wander limits interpretation no significant change since 2016 Confirmed by Earmon Sherrow MD, Cambrea Kirt [id number]) on 02/29/2016 6:22:27 PM       EKG Interpretation  Date/Time:  Thursday February 29 2016 19:08:02 EST Ventricular Rate:  61 PR Interval:    QRS Duration: 107 QT Interval:  470 QTC Calculation: 474 R Axis:   -49 Text Interpretation:  Sinus rhythm LAD, consider left anterior  fascicular block Abnormal R-wave progression, early transition no significant change since earlier in the day or 2016 Confirmed by Higinio Grow MD, Ayrianna Mcginniss [phone]) on 02/29/2016 7:45:08 PM       Radiology Dg Chest 2 View  Result Date: 02/29/2016 CLINICAL DATA:  Acute onset of left arm aching.  Initial encounter. EXAM: CHEST  2 VIEW COMPARISON:  Chest radiograph performed 04/11/2014, and CT of the chest performed 07/26/2014 FINDINGS: The lungs are well-aerated. Mild peribronchial thickening is noted. There is no evidence of focal opacification, pleural effusion or pneumothorax. The heart is normal in size; the mediastinal contour is within normal limits. No acute osseous abnormalities are seen. The patient's right shoulder arthroplasty is grossly unremarkable, though incompletely assessed. IMPRESSION: Mild peribronchial thickening noted.  Lungs otherwise grossly clear. Electronically Signed   By: Garald Balding M.D.   On: 02/29/2016 17:43    Procedures Procedures (including critical care time)  Medications Ordered in ED Medications  acetaminophen (TYLENOL) tablet 650 mg (650 mg Oral Given 02/29/16 1947)  ibuprofen (ADVIL,MOTRIN) tablet 600 mg (600 mg Oral Given 02/29/16 2010)     Initial Impression / Assessment and Plan / ED Course  I have reviewed the triage vital signs and the nursing notes.  Pertinent labs & imaging results that were available during my care of the patient were reviewed by me and considered in my medical decision making (see chart for details).  Clinical Course as of Mar 01 38  Thu Feb 29, 2016  1842 I think patient's pain is unlikely to be ACS. More likely musculoskeletal although at this time it is not reproducible (also has right minimal pain). No trauma. No swelling or bony tenderness to suggest needing an x-ray. There is also no chest pain or exertional symptoms. ECG is unchanged. Given her multiple coronary risk factors, however, will get a second troponin.  [SG]  1929  Patient now having an increased amount of pain in her left upper arm. On my exam she has posterior tenderness, mild. Over her triceps. Increased pain with range of motion. There is no swelling or skin changes. This is reassuring that this is most likely musculoskeletal. Repeat ECG benign. Plan for second troponin. Tylenol  [SG]    Clinical Course User Index [SG] Sherwood Gambler, MD    I believe this is musculoskeletal arm pain. Has 2 negative troponins and 2 ECGs. Atypical ACS is thought to be much less likely. Originally there was no pain on palpation but now is having reproducible muscular pain. No bony tenderness. No swelling to suggest DVT. NSAIDs, heat, Tylenol, and follow-up with PCP. Discussed strict return precautions.  Final Clinical Impressions(s) / ED Diagnoses   Final diagnoses:  Left upper arm pain    New Prescriptions Discharge Medication List as of 02/29/2016  9:35 PM       Sherwood Gambler, MD 03/01/16 0041

## 2016-02-29 NOTE — Telephone Encounter (Signed)
Patient called stating that she is unsure if she needs to call 911 because she is experiencing left arm weakness. Denied any chest pain, or nausea. States she just felt funny. Transferred to [facility]

## 2016-02-29 NOTE — ED Notes (Signed)
Pt reports L arm pain which started today.  Denies any cp or SOB or dizziness.  Pt appears comfortable.  Reports taking tylenol at 1400 for the pain with some relief.

## 2016-03-01 NOTE — Telephone Encounter (Signed)
Patient went to ED on 09/28/16 for evaluation  of left arm weakness.

## 2016-03-04 ENCOUNTER — Telehealth: Payer: Self-pay | Admitting: Internal Medicine

## 2016-03-04 ENCOUNTER — Ambulatory Visit (INDEPENDENT_AMBULATORY_CARE_PROVIDER_SITE_OTHER): Payer: Medicare Other | Admitting: Internal Medicine

## 2016-03-04 ENCOUNTER — Encounter: Payer: Self-pay | Admitting: Internal Medicine

## 2016-03-04 ENCOUNTER — Ambulatory Visit (INDEPENDENT_AMBULATORY_CARE_PROVIDER_SITE_OTHER): Payer: 59 | Admitting: Psychology

## 2016-03-04 VITALS — BP 120/72 | HR 58 | Temp 97.7°F | Resp 14 | Ht 67.0 in | Wt 190.2 lb

## 2016-03-04 DIAGNOSIS — M25512 Pain in left shoulder: Secondary | ICD-10-CM

## 2016-03-04 DIAGNOSIS — F4323 Adjustment disorder with mixed anxiety and depressed mood: Secondary | ICD-10-CM | POA: Diagnosis not present

## 2016-03-04 LAB — MICROALBUMIN, URINE: Microalb, Ur: <0.7

## 2016-03-04 NOTE — Assessment & Plan Note (Signed)
Shoulder pain: Rotator cuff related?  Rec conservative treatment with judicious use of Tylenol, ibuprofen and self exercises . Also  refer to orthopedic is an option. Since the pain is still there and somewhat significant we agreed on refer to Dr. Tamera Punt who previously replace her right shoulder. If she takes ibuprofen,GI precautions encouraged

## 2016-03-04 NOTE — Patient Instructions (Signed)
Tylenol  500 mg OTC 2 tabs a day every 8 hours as needed for pain  IBUPROFEN (Advil or Motrin) 200 mg 2 tablets every 6 hours as needed for pain.  Always take it with food because may cause gastritis and ulcers.  If you notice nausea, stomach pain, change in the color of stools --->  Stop the medicine and let us know

## 2016-03-04 NOTE — Progress Notes (Signed)
Pre visit review using our clinic review tool, if applicable. No additional management support is needed unless otherwise documented below in the visit note. 

## 2016-03-04 NOTE — Telephone Encounter (Signed)
Caller name: [name] Relationship to patient: Daughter Can be reached: [phone] Pharmacy:  Reason for call: Daughter request call back to find out what the outcome of patient's visit this morning was.

## 2016-03-04 NOTE — Telephone Encounter (Signed)
Spoke w/ Pt's daughter [name], informed of ortho referral to Dr. Tamera Punt. [name] verbalized understanding.

## 2016-03-04 NOTE — Progress Notes (Signed)
Subjective:    Patient ID: [name], female    DOB: 1945-07-21, 71 y.o.   MRN: [id number]  DOS:  03/04/2016 Type of visit - description : ER follow-up Interval history: Went to the ER 02/29/2016 with left arm pain with some radiation to the neck, at some point as intense as 7-10. Labs: Troponin negative twice, CBC and BMP satisfactory, hemoglobin slightly low. EKG with no acute changes, chest x-ray unremarkable  Review of Systems  She is here for a follow-up, pain started few weeks ago, no injury or fall. Pain was as intense as 7/10, now better but it is still there, worse with certain movements, no radiation. "It feels like a muscle ache" Denies upper or lower extremity paresthesias No motor deficits No fever chills No chest pain, difficulty breathing or edema  Past Medical History:  Diagnosis Date  . Adenomatous colon polyp 03/1988  . Anemia    borderline  . Arthritis    R shoulder - degenerative   . Cataract    beginning stages  . Chest pain     Sept 2011: stress test neg  . Depression    sees Dr.Cotle  . Diabetes mellitus    dr Buddy Duty  . Diverticulosis   . Fatty liver    Increased LFTs, saw GI 06/2011, likely from fatty liver   . GERD (gastroesophageal reflux disease)   . History of hiatal hernia   . Hyperlipemia   . Hypertension   . IBS (irritable bowel syndrome)    and dyspepsia  . Memory impairment 08/2014   mild cognitive impairment vs. mild dementia, reommended reinstating of cholinesterase inhibitor   . Nausea and vomiting    on and off for weeks  . Osteopenia    dexa 06/2007 and 10/11 Rx CA vitamin d  . RLS (restless legs syndrome)    h/o- no longer using Requip    Past Surgical History:  Procedure Laterality Date  . BREAST BIOPSY Left 10/11/2015   fibroadenoma w/ calcifications, no evidence of malignancy, recommend mammogram repeat-6 mnths  . POLYPECTOMY    . REVERSE SHOULDER ARTHROPLASTY Right 04/21/2014   Procedure: REVERSE SHOULDER  ARTHROPLASTY;  Surgeon: Tania Ade, MD;  Location: [facility];  Service: Orthopedics;  Laterality: Right;  Right reverse total shoulder replacement  . TONSILLECTOMY    . UTERINE FIBROID EMBOLIZATION     90s    Social History   Social History  . Marital status: Widowed    Spouse name: [name]  . Number of children: 3  . Years of education: N/A   Occupational History  . 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 Smoking status: Former Smoker    Packs/day: 2.00    Years: 10.00    Types: Cigarettes    Quit date: 11/26/1975  . Smokeless tobacco: Never Used     Comment: used to smoke 2 ppd  . Alcohol use 0.0 oz/week     Comment: rarely  . Drug use: No  . Sexual activity: Not Currently   Other Topics Concern  . Not on file   Social History Narrative   Widow , lives by herself, still drives, daughter [name] lives in [place]; another daughter in [place]   5 g-children      Allergies as of 03/04/2016      Reactions   Sulfa Antibiotics Nausea Only   Trulicity [dulaglutide] Nausea Only   Penicillins Rash, Other (See Comments)  Has patient had a PCN reaction causing immediate rash, facial/tongue/throat swelling, SOB or lightheadedness with hypotension: No Has patient had a PCN reaction causing severe rash involving mucus membranes or skin necrosis: No Has patient had a PCN reaction that required hospitalization No Has patient had a PCN reaction occurring within the last 10 years: No If all of the above answers are "NO", then may proceed with Cephalosporin use.      Medication List       Accurate as of 03/04/16 10:10 AM. Always use your most recent med list.          acetaminophen 500 MG tablet Commonly known as:  TYLENOL Take 500-1,000 mg by mouth every 6 (six) hours as needed for mild pain, moderate pain or headache.   ARICEPT 10 MG tablet Generic drug:  donepezil Take 2 tablets (20 mg total) by mouth at bedtime.   aspirin EC 81 MG  tablet Take 81 mg by mouth daily.   atenolol 50 MG tablet Commonly known as:  TENORMIN Take 0.5 tablets (25 mg total) by mouth daily.   atorvastatin 40 MG tablet Commonly known as:  LIPITOR Take 1 tablet (40 mg total) by mouth daily.   darifenacin 15 MG 24 hr tablet Commonly known as:  ENABLEX Take 1 tablet (15 mg total) by mouth daily.   FERRO-SEQUELS 65-25 MG Tbcr Generic drug:  Ferrous Fumarate-Vitamin C ER Take 1 tablet by mouth daily.   LEVEMIR FLEXTOUCH 100 UNIT/ML Pen Generic drug:  Insulin Detemir Inject 80 Units into the skin at bedtime.   losartan 25 MG tablet Commonly known as:  COZAAR Take 1 tablet (25 mg total) by mouth every evening.   metFORMIN 1000 MG tablet Commonly known as:  GLUCOPHAGE Take 1 tablet (1,000 mg total) by mouth 2 (two) times daily with a meal.   omeprazole 40 MG capsule Commonly known as:  PRILOSEC Take 1 capsule (40 mg total) by mouth daily.   PREVIDENT 5000 DRY MOUTH 1.1 % Gel dental gel Generic drug:  sodium fluoride Place 1 application onto teeth at bedtime.   TRINTELLIX 20 MG Tabs Generic drug:  vortioxetine HBr Take 20 mg by mouth daily.   vitamin B-12 1000 MCG tablet Commonly known as:  CYANOCOBALAMIN Take 1 tablet (1,000 mcg total) by mouth daily.          Objective:   Physical Exam BP 120/72 (BP Location: Right Arm, Patient Position: Sitting, Cuff Size: Normal)   Pulse (!) 58   Temp 97.7 F (36.5 C) (Oral)   Resp 14   Ht 5\' 7"  (1.702 m)   Wt 190 lb 4 oz (86.3 kg)   SpO2 98%   BMI 29.80 kg/m  General:   Well developed, well nourished . NAD.  HEENT:  Normocephalic . Face symmetric, atraumatic MSK: Right shoulder with good range of motion. Left shoulder: No TTP on the lateral deltoid area, no deformities. Passive range of motion is moderate pain with arm elevation. Skin: Not pale. Not jaundice Neurologic:  alert & oriented X3.  Speech normal, gait appropriate for age and unassisted. Motor  symmetric Psych--  Cognition and judgment appear intact.  Cooperative with normal attention span and concentration.  Behavior appropriate. No anxious or depressed appearing.      Assessment & Plan:   Assessment  DM Dr. Buddy Duty HTN Hyperlipidemia Depression Dr. Clovis Pu OSA -- mild, saw Dr Gwenette Greet 2014, no CPAP GI: --IBS, colon polyps, diverticulosis, hiatal hernia --Chronic constipation likely part of her IBS syndrome  --  Fatty liver >>> GI eval 2013 --iron fec anemia:  cscope 04-2014 : 2 polyps; + hemocult @ GI office 10-2014: EGD done (-), bx neg Osteopenia: T score -1.1  2009 , osteopenia again per dexa 05-2013 , rx ca-vit d B12 deficiency  RLS MSK: --DJD frozen shoulder Mild cognitive impairment  MMSE 2015 --> 28, on Aricept,  sees Dr Everette Rank  Dizziness: chronic, carotid US 2016 neg, saw cards-- not likely CV related; saw neuro DR Everette Rank 02-2015  Chest pain 09-2009, negative stress test Thyroid nodules: Incidental   by Carotid US, BX 11-2014: Atypical findings, [facility], f/u  Endocrine  PLAN: Shoulder pain: Rotator cuff related?  Rec conservative treatment with judicious use of Tylenol, ibuprofen and self exercises . Also  refer to orthopedic is an option. Since the pain is still there and somewhat significant we agreed on refer to Dr. Tamera Punt who previously replace her right shoulder. If she takes ibuprofen,GI precautions encouraged

## 2016-03-04 NOTE — Telephone Encounter (Signed)
Will refer her to Dr. Tamera Punt regards shoulder pain, no other issues were brought up by the patient

## 2016-03-05 ENCOUNTER — Telehealth: Payer: Self-pay | Admitting: Internal Medicine

## 2016-03-05 MED ORDER — HYDROCODONE-ACETAMINOPHEN 5-325 MG PO TABS: 1.0000 | ORAL_TABLET | Freq: Three times a day (TID) | ORAL | 0 refills | Status: DC | PRN

## 2016-03-05 NOTE — Telephone Encounter (Signed)
We can try small dose of Vicodin, see prescription. Warn the patient about drowsiness.

## 2016-03-05 NOTE — Telephone Encounter (Signed)
Caller name: Relationship to patient: Self Can be reached: [phone] Pharmacy:   [id number] - , [place] (Phone) [phone] (Fax)     Reason for call: Patient request a Rx for a pain medication to help her with her shoulder pain.

## 2016-03-05 NOTE — Telephone Encounter (Signed)
Please advise 

## 2016-03-05 NOTE — Telephone Encounter (Signed)
LMOM informing Pt that Rx has been placed at front desk for pick up. Instructed to call if questions/concerns.

## 2016-03-15 ENCOUNTER — Ambulatory Visit: Payer: 59 | Admitting: Psychology

## 2016-03-22 ENCOUNTER — Ambulatory Visit: Payer: 59 | Admitting: Psychology

## 2016-03-25 ENCOUNTER — Other Ambulatory Visit: Payer: Self-pay | Admitting: Internal Medicine

## 2016-03-27 ENCOUNTER — Ambulatory Visit: Payer: 59 | Admitting: Psychology

## 2016-03-28 ENCOUNTER — Ambulatory Visit (INDEPENDENT_AMBULATORY_CARE_PROVIDER_SITE_OTHER): Payer: 59 | Admitting: Psychology

## 2016-03-28 DIAGNOSIS — F331 Major depressive disorder, recurrent, moderate: Secondary | ICD-10-CM

## 2016-04-14 IMAGING — CR DG CHEST 2V
2 series · 2 of 2 positions shown · non-contrast
Comparison: 01/16/2012; correlation CT chest 07/15/2013

CLINICAL DATA: Preoperative evaluation for shoulder replacement
surgery, history diabetes, hypertension, GERD

EXAM:
CHEST  2 VIEW

[w chest pa]
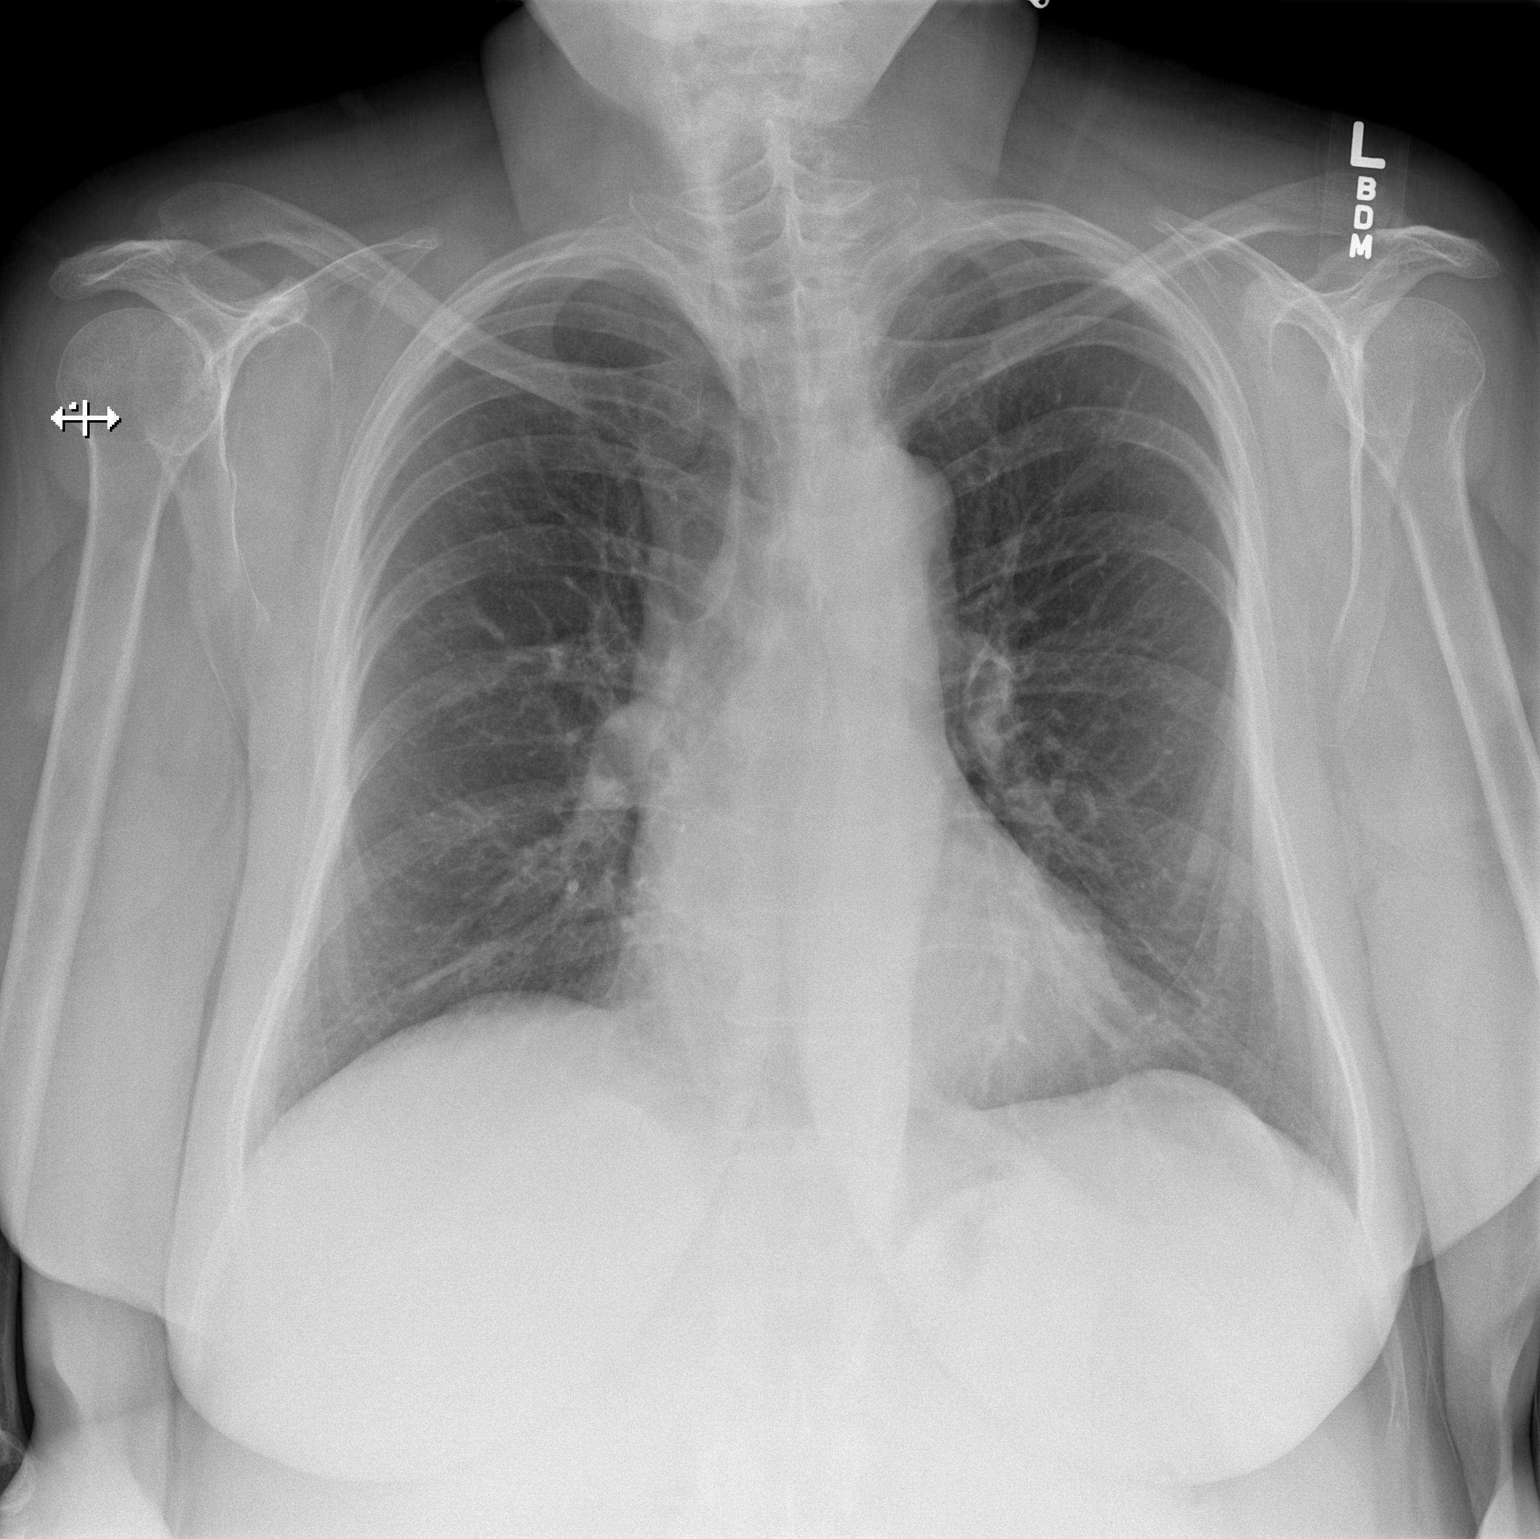

[w chest lat]
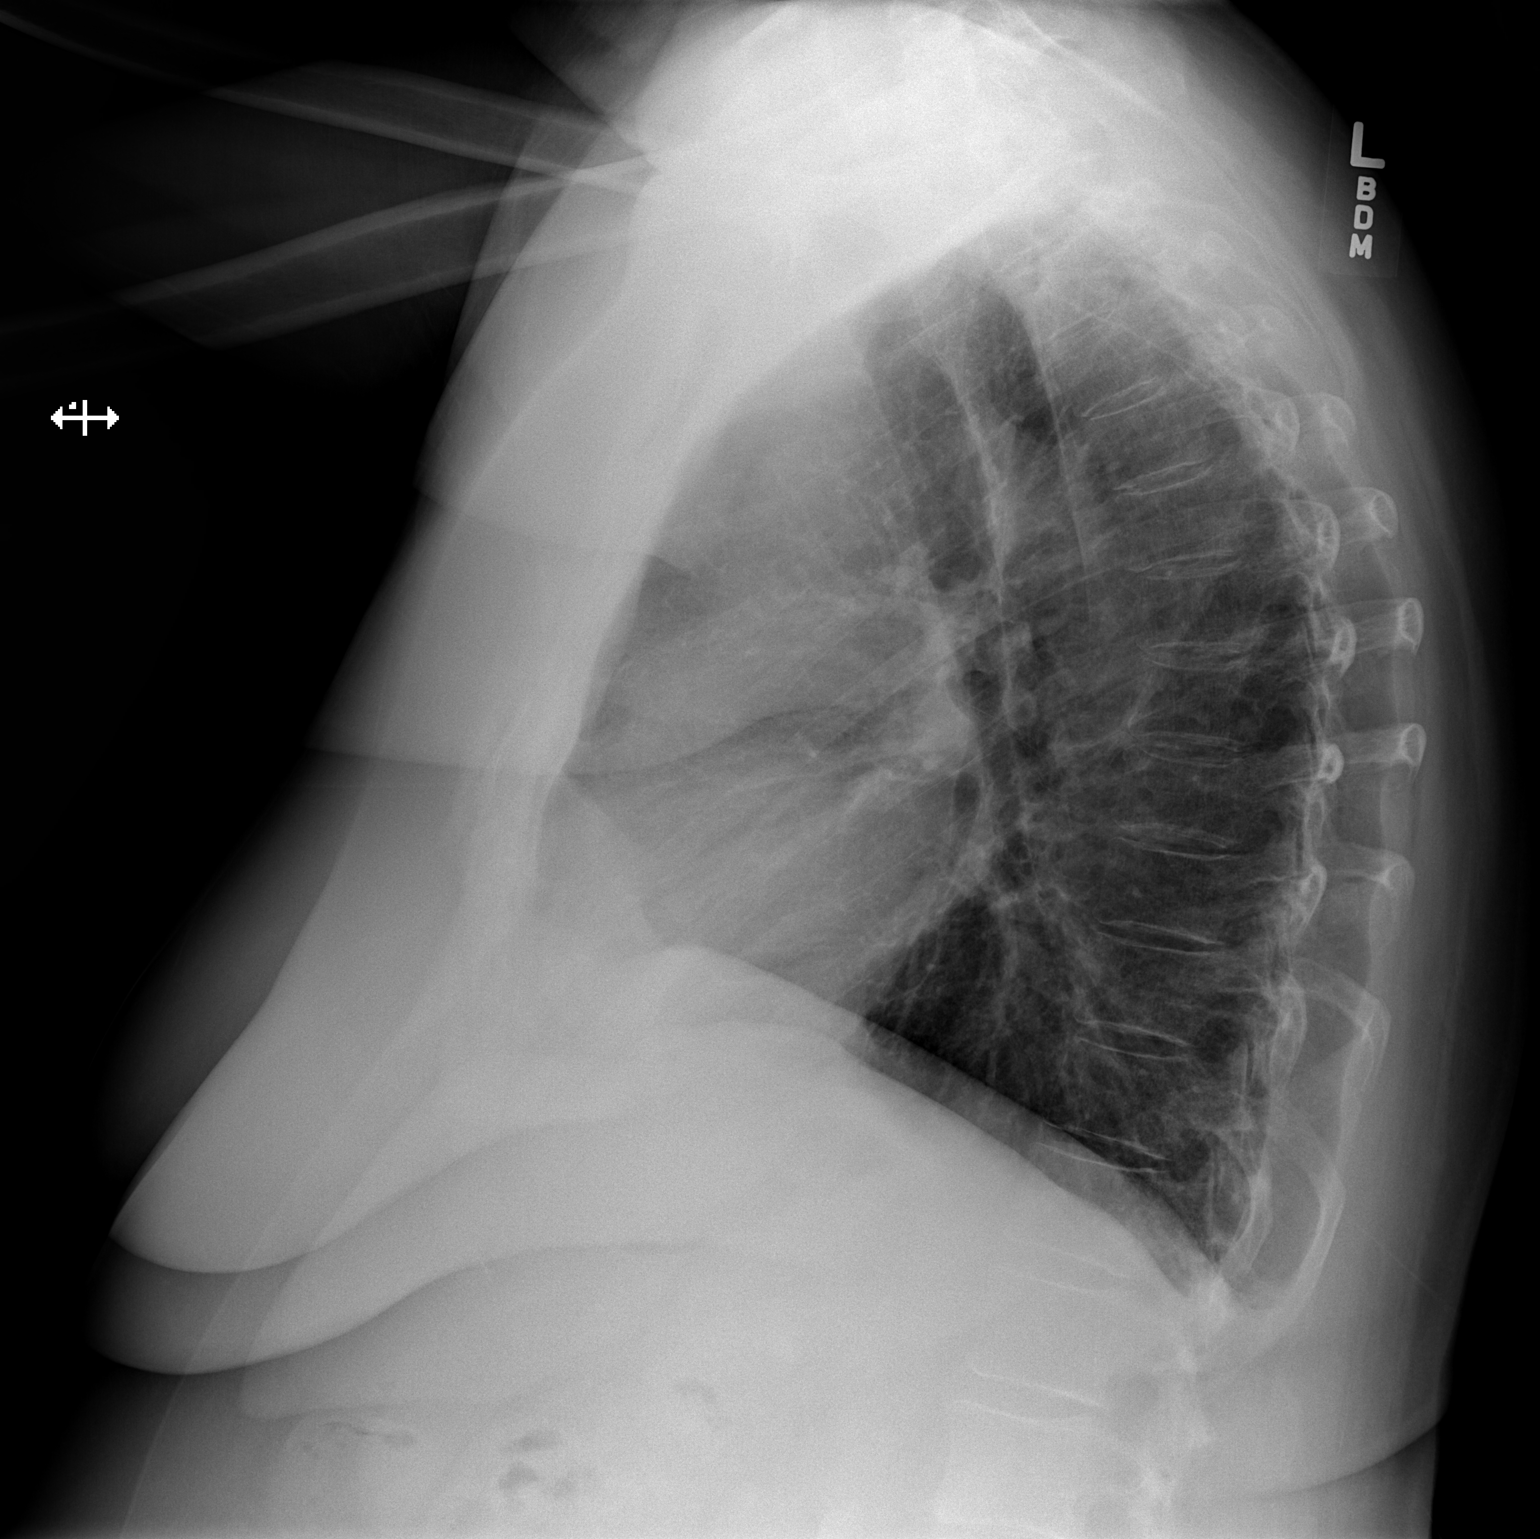

[2 of 2 positions shown; findings below may reference images not displayed]

FINDINGS: Upper normal heart size.

Mediastinal contours and pulmonary vascularity normal.

Lungs appear mildly hyperinflated but clear.

No pleural effusion or pneumothorax.

6 mm superior segment RIGHT lower lobe nodule identified on prior CT
is not radiographically apparent.

Bones demineralized.
IMPRESSION: No acute abnormalities.

## 2016-04-15 IMAGING — CT CT SHOULDER*R* W/O CM
3 series · 8 of 14 positions shown, 9 images · non-contrast
Comparison: MRI right shoulder 11/19/2013.

CLINICAL DATA: Preoperative examination. Patient for right shoulder
replacement 04/21/2014. Chronic right shoulder pain and limited
range of motion.

EXAM:
CT OF THE RIGHT SHOULDER WITHOUT CONTRAST
TECHNIQUE: Multidetector CT imaging was performed according to the standard
protocol. Multiplanar CT image reconstructions were also generated.

[Series 5: shoulder soft · axial · 0.48mm/px · z∈[-160,-90]mm · 2 of 85 slices shown, 3 images]
[im 29/85  soft-tissue]
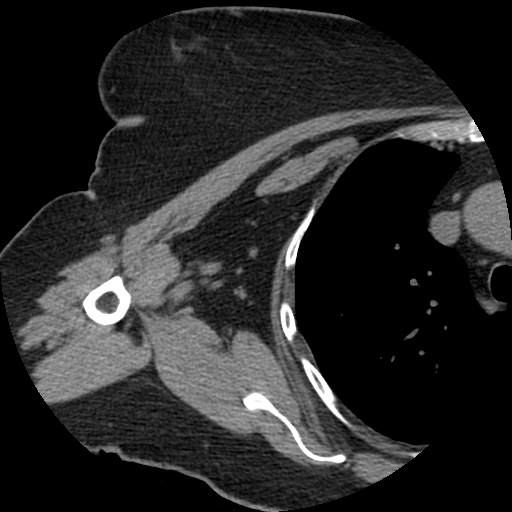
[im 29/85  bone]
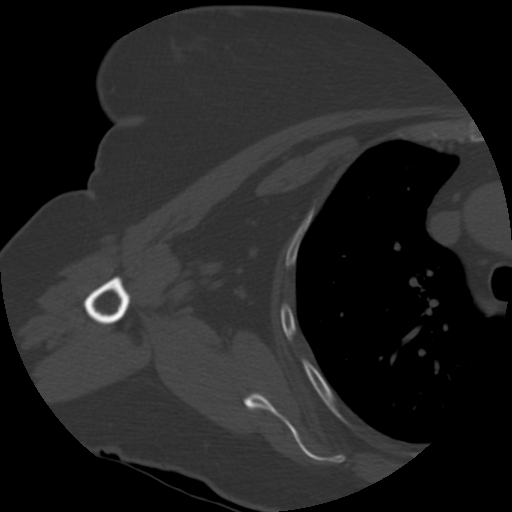
[im 57/85  bone]
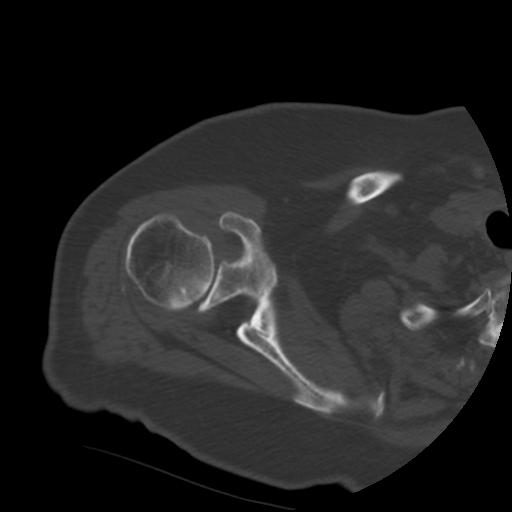

[Series 300: cor soft · oblique · 0.48mm/px · 3 of 112 slices shown]
[im 28/112  soft-tissue]
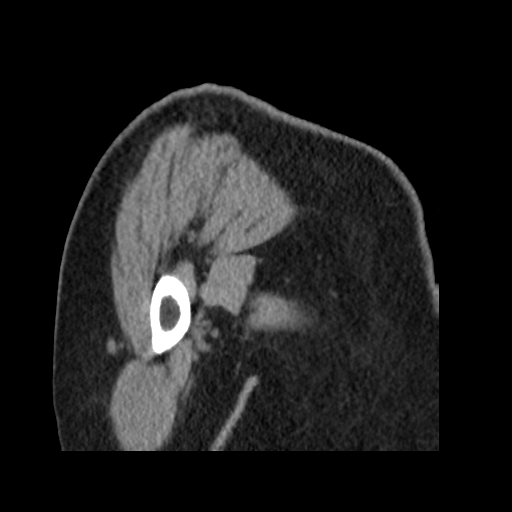
[im 56/112  soft-tissue]
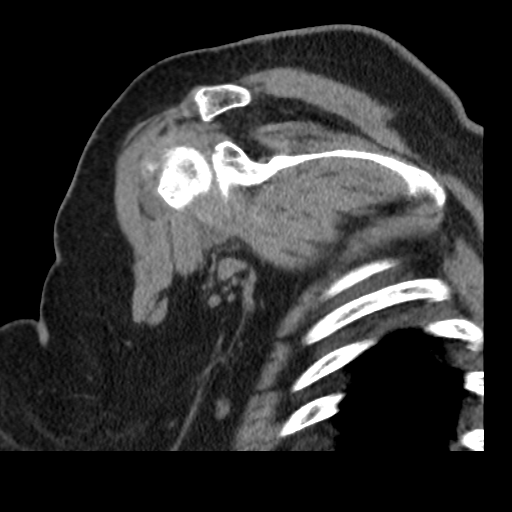
[im 84/112  soft-tissue]
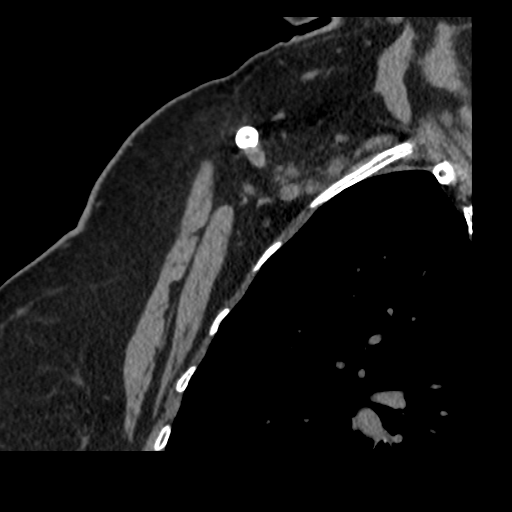

[Series 301: sag soft · oblique · 0.48mm/px · 3 of 113 slices shown]
[im 29/113  soft-tissue]
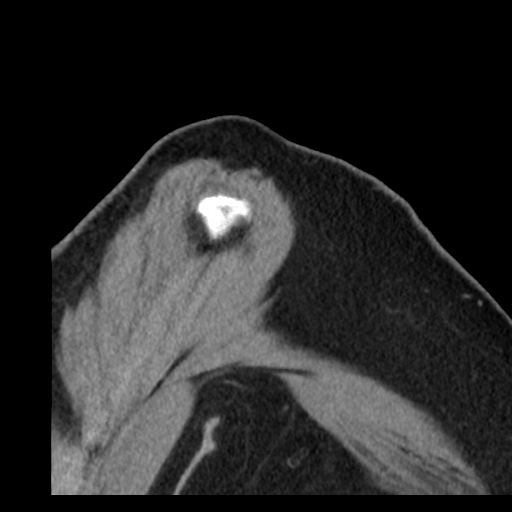
[im 57/113  soft-tissue]
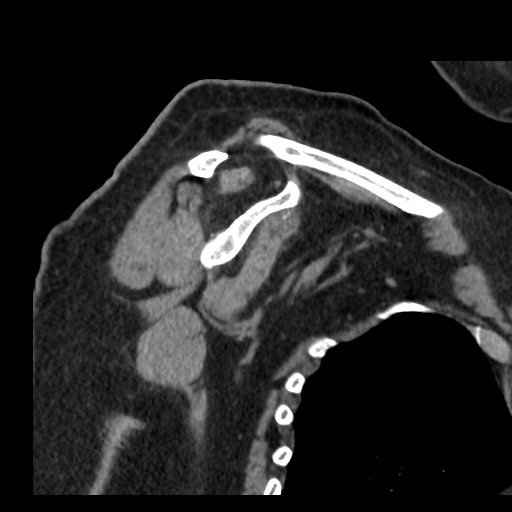
[im 85/113  soft-tissue]
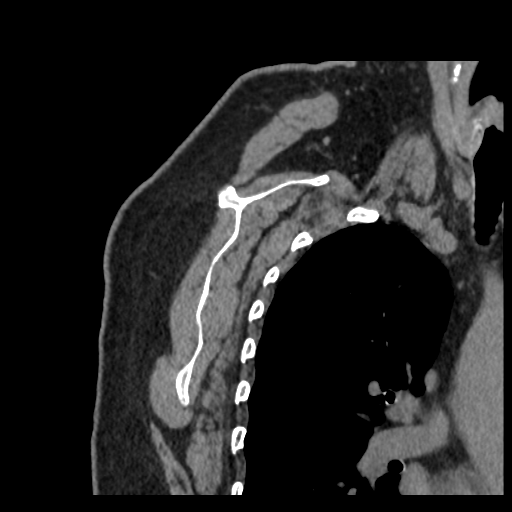

[8 of 14 positions shown; findings below may reference images not displayed]

FINDINGS: As on the patient's MRI, there is marked glenohumeral osteoarthritis
with joint space narrowing and a small osteophyte off the humeral
head. Subchondral sclerosis in the glenoid is identified. Small
subchondral cysts are also seen. The largest is in the anterior
glenoid in the superior pole and measures 0.5 cm AP by 0.5 cm
transverse by up to 0.9 cm craniocaudal. The glenoid is slightly mid
mildly remodeled. A small sclerotic lesion in the humeral head is
consistent with a bone island. Also seen is a focus of flattening of
subchondral bone in the superior, posterior aspect of the articular
surface of the humeral head measuring 1.3 cm craniocaudal by 1.4 cm
transverse. This likely reflects avascular necrosis and is barely
perceptible on the prior exam. Surrounding sclerosis is noted.

There is mild to moderate acromioclavicular degenerative change. A
small subacromial spur is noted. The supraspinatus tendon tear seen
on the prior MRI is not as well demonstrated today. Musculature
about the shoulder appears intact without atrophy or focal lesion.
Fluid is identified in the subscapularis recess Imaged lung
parenchyma is clear.
IMPRESSION: Glenohumeral osteoarthritis with associated subchondral sclerosis
and small subchondral cysts.

Flattening of subchondral bone in the posterior, superior aspect of
the articular surface of the humeral head is most consistent with
avascular necrosis.

Supraspinatus tendon tear seen on comparison MRI is not well
demonstrated today.

Mild to moderate acromioclavicular osteoarthritis. Subacromial
spurring is also identified.

Fluid in the subcoracoid bursa with small loose bodies consistent
with bursitis.

## 2016-04-16 ENCOUNTER — Ambulatory Visit (INDEPENDENT_AMBULATORY_CARE_PROVIDER_SITE_OTHER): Payer: 59 | Admitting: Psychology

## 2016-04-16 DIAGNOSIS — F331 Major depressive disorder, recurrent, moderate: Secondary | ICD-10-CM

## 2016-04-25 ENCOUNTER — Encounter: Payer: Self-pay | Admitting: Internal Medicine

## 2016-04-30 ENCOUNTER — Ambulatory Visit (INDEPENDENT_AMBULATORY_CARE_PROVIDER_SITE_OTHER): Payer: 59 | Admitting: Psychology

## 2016-04-30 DIAGNOSIS — F331 Major depressive disorder, recurrent, moderate: Secondary | ICD-10-CM

## 2016-05-07 ENCOUNTER — Ambulatory Visit: Payer: 59 | Admitting: Psychology

## 2016-05-16 ENCOUNTER — Telehealth: Payer: Self-pay | Admitting: Gastroenterology

## 2016-05-16 NOTE — Telephone Encounter (Signed)
Patient has been rescheduled to an earlier appt on Monday 05/20/16

## 2016-05-20 ENCOUNTER — Ambulatory Visit: Payer: Self-pay | Admitting: Gastroenterology

## 2016-05-20 ENCOUNTER — Telehealth: Payer: Self-pay

## 2016-05-20 NOTE — Telephone Encounter (Signed)
Follow up call made to Patient. Called [facility] on 05/18/16 regarding Endocrinologists contact information which she was given. States she feels better today and has made appointment to see Dr. Barbette Hair. Advised to call office if symptoms return. Patient agreed.

## 2016-05-21 ENCOUNTER — Ambulatory Visit: Payer: 59 | Admitting: Psychology

## 2016-05-23 ENCOUNTER — Ambulatory Visit: Payer: Medicare Other | Admitting: Podiatry

## 2016-06-04 ENCOUNTER — Ambulatory Visit: Payer: 59 | Admitting: Psychology

## 2016-06-06 ENCOUNTER — Ambulatory Visit (INDEPENDENT_AMBULATORY_CARE_PROVIDER_SITE_OTHER): Payer: Medicare Other | Admitting: Gastroenterology

## 2016-06-06 ENCOUNTER — Telehealth: Payer: Self-pay | Admitting: Gastroenterology

## 2016-06-06 ENCOUNTER — Encounter: Payer: Self-pay | Admitting: Gastroenterology

## 2016-06-06 VITALS — BP 120/70 | HR 60 | Ht 65.25 in | Wt 185.4 lb

## 2016-06-06 DIAGNOSIS — K219 Gastro-esophageal reflux disease without esophagitis: Secondary | ICD-10-CM

## 2016-06-06 DIAGNOSIS — R112 Nausea with vomiting, unspecified: Secondary | ICD-10-CM | POA: Diagnosis not present

## 2016-06-06 MED ORDER — ONDANSETRON HCL 4 MG PO TABS: 4.0000 mg | ORAL_TABLET | Freq: Three times a day (TID) | ORAL | 1 refills | Status: DC | PRN

## 2016-06-06 NOTE — Patient Instructions (Addendum)
Start on omeprazole 40 mg twice daily that you have at home.   We have sent the following medications to your pharmacy for you to pick up at your convenience: Zofran.  Patient advised to avoid spicy, acidic, citrus, chocolate, mints, fruit and fruit juices.  Limit the intake of caffeine, alcohol and Soda.  Don't exercise too soon after eating.  Don't lie down within 3-4 hours of eating.  Elevate the head of your bed.  Call back in one month if your symptoms have not improved.   Thank you for choosing me and [facility].  Pricilla Riffle. Dagoberto Ligas., MD., Marval Regal

## 2016-06-06 NOTE — Telephone Encounter (Signed)
pharmacy called to state that pt is taking medication donepezil (ARICEPT) which interferes with the medication zofran that was prescribed today. Pharmacy would like a call back to see if something else needs to be called in

## 2016-06-06 NOTE — Telephone Encounter (Signed)
After speaking with the pharmacist, he states there is not any interaction when he went to his pharmacy software interaction program.

## 2016-06-06 NOTE — Progress Notes (Signed)
    History of Present Illness: This is a 71 year old female returning for intermittent nausea and vomiting. She is accompanied by her daughter. The patient has memory difficulties and the daughter supplies most of the history. The patient did very well for several months on omeprazole 40 mg twice a day with no problems with GERD, nausea or vomiting. Patient decided to discontinue omeprazole since she was feeling better and her nausea and vomiting returned about one month ago. She resumed omeprazole 40 mg twice daily and although her symptoms have improved she still notes intermittent nausea and vomiting. Due to her memory difficulties is difficult to pinpoint any specific foods, medications or times of day when her symptoms are more active. Patient relates that she recently started Namenda.   Current Medications, Allergies, Past Medical History, Past Surgical History, Family History and Social History were reviewed in [employer] record.  Physical Exam: General: Well developed, well nourished, no acute distress Head: Normocephalic and atraumatic Eyes:  sclerae anicteric, EOMI Ears: Normal auditory acuity Mouth: No deformity or lesions Lungs: Clear throughout to auscultation Heart: Regular rate and rhythm; no murmurs, rubs or bruits Abdomen: Soft, non tender and non distended. No masses, hepatosplenomegaly or hernias noted. Normal Bowel sounds Musculoskeletal: Symmetrical with no gross deformities  Pulses:  Normal pulses noted Extremities: No clubbing, cyanosis, edema or deformities noted Neurological: Alert oriented x 4, grossly nonfocal. Memory impairment. Psychological:  Alert and cooperative. Normal mood and affect  Assessment and Recommendations:  1. GERD. Intermittent nausea and vomiting. Continue omeprazole 40 mg twice daily long term. Follow standard antireflux measures more closely. Possible Namenda side effect. Patient states she drinks 4-5 diet Cokes per day  and there are other foods that she has regularly that are not compatible with antireflux measures. We discussed close adherence to standard antireflux measures. Zofran 4 mg by mouth 3 times a day when necessary. She is advised to keep a log when she notes nausea and vomiting and what occurred during the proceeding 2-3 hours such as meals, foods, medications. After following all the above if her symptoms persist beyond one month will proceed with a gastric emptying scan to further evaluate.

## 2016-06-11 ENCOUNTER — Encounter: Payer: Self-pay | Admitting: Podiatry

## 2016-06-11 ENCOUNTER — Ambulatory Visit (INDEPENDENT_AMBULATORY_CARE_PROVIDER_SITE_OTHER): Payer: Medicare Other | Admitting: Podiatry

## 2016-06-11 DIAGNOSIS — Q828 Other specified congenital malformations of skin: Secondary | ICD-10-CM

## 2016-06-11 DIAGNOSIS — B351 Tinea unguium: Secondary | ICD-10-CM

## 2016-06-11 DIAGNOSIS — M79676 Pain in unspecified toe(s): Secondary | ICD-10-CM | POA: Diagnosis not present

## 2016-06-11 NOTE — Progress Notes (Signed)
She presents today chief complaint of painful mycotic nails and calluses.  Objective: Vital signs are stable [name] 3 pulses are palpable. Toenails are thick yellow dystrophic with mycotic painful palpation. She also has thick porokeratotic lesions plantar aspect bilateral foot.  Assessment: Painless onychomycosis porokeratosis.  Plan: Debridement of all reactive hyperkeratotic tissue debridement tenderness 1 through 5 bilateral.

## 2016-06-17 ENCOUNTER — Other Ambulatory Visit: Payer: Self-pay | Admitting: Internal Medicine

## 2016-06-18 ENCOUNTER — Ambulatory Visit (INDEPENDENT_AMBULATORY_CARE_PROVIDER_SITE_OTHER): Payer: 59 | Admitting: Psychology

## 2016-06-18 DIAGNOSIS — F331 Major depressive disorder, recurrent, moderate: Secondary | ICD-10-CM | POA: Diagnosis not present

## 2016-06-21 ENCOUNTER — Ambulatory Visit (INDEPENDENT_AMBULATORY_CARE_PROVIDER_SITE_OTHER): Payer: Medicare Other | Admitting: Family

## 2016-06-21 ENCOUNTER — Encounter: Payer: Self-pay | Admitting: Family

## 2016-06-21 VITALS — BP 131/69 | HR 60 | Temp 98.4°F | Resp 16 | Ht 67.0 in | Wt 187.0 lb

## 2016-06-21 DIAGNOSIS — R6883 Chills (without fever): Secondary | ICD-10-CM | POA: Diagnosis not present

## 2016-06-21 DIAGNOSIS — R6889 Other general symptoms and signs: Secondary | ICD-10-CM | POA: Diagnosis not present

## 2016-06-21 DIAGNOSIS — R82998 Other abnormal findings in urine: Secondary | ICD-10-CM

## 2016-06-21 LAB — POC URINALSYSI DIPSTICK (AUTOMATED)
Bilirubin, UA: NEGATIVE
Blood, UA: NEGATIVE
Glucose, UA: NEGATIVE
Ketones, UA: NEGATIVE
Nitrite, UA: NEGATIVE
Protein, UA: NEGATIVE
Spec Grav, UA: 1.015 (ref 1.010–1.025)
Urobilinogen, UA: 0.2 E.U./dL
pH, UA: 6 (ref 5.0–8.0)

## 2016-06-21 MED ORDER — CIPROFLOXACIN HCL 250 MG PO TABS: 250.0000 mg | ORAL_TABLET | Freq: Two times a day (BID) | ORAL | 0 refills | Status: DC

## 2016-06-21 NOTE — Progress Notes (Signed)
Subjective:    Patient ID: [name], female    DOB: 11-13-1945, 71 y.o.   MRN: [id number]  HPI   Ms. [name] is a 71 yr old female who presents today  with vague complaints of intermittent chills and then feeling overheated. She denies fever.  Denies significant fatigue.  Feels "like my thyroid is off."      Review of Systems  Constitutional: Negative for fever.  HENT: Positive for rhinorrhea.   Respiratory: Negative for cough and shortness of breath.   Gastrointestinal: Negative for blood in stool and nausea.       Reports regular BM's last few days.   Genitourinary: Negative for dysuria, frequency and hematuria.  Musculoskeletal: Negative for arthralgias and myalgias.  Skin: Negative for rash.  Neurological: Negative for headaches.  Hematological: Does not bruise/bleed easily.      see HPI  Past Medical History:  Diagnosis Date  . Adenomatous colon polyp 03/1988  . Anemia    borderline  . Arthritis    R shoulder - degenerative   . Cataract    beginning stages  . Chest pain     Sept 2011: stress test neg  . Depression    sees Dr.Cotle  . Diabetes mellitus    dr Buddy Duty  . Diverticulosis   . Fatty liver    Increased LFTs, saw GI 06/2011, likely from fatty liver   . GERD (gastroesophageal reflux disease)   . History of hiatal hernia   . Hyperlipemia   . Hypertension   . IBS (irritable bowel syndrome)    and dyspepsia  . Memory impairment 08/2014   mild cognitive impairment vs. mild dementia, reommended reinstating of cholinesterase inhibitor   . Osteopenia    dexa 06/2007 and 10/11 Rx CA vitamin d  . RLS (restless legs syndrome)    h/o- no longer using Requip     Social History   Social History  . Marital status: Widowed    Spouse name: [name]  . Number of children: 3  . Years of education: N/A   Occupational History  . 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 Smoking status: Former Smoker    Packs/day: 2.00     Years: 10.00    Types: Cigarettes    Quit date: 11/26/1975  . Smokeless tobacco: Never Used     Comment: used to smoke 2 ppd  . Alcohol use 0.0 oz/week     Comment: rarely  . Drug use: No  . Sexual activity: Not Currently   Other Topics Concern  . Not on file   Social History Narrative   Widow , lives by herself, still drives, daughter [name] lives in [place]; another daughter in [place]   5 g-children    Past Surgical History:  Procedure Laterality Date  . BREAST BIOPSY Left 10/11/2015   fibroadenoma w/ calcifications, no evidence of malignancy, recommend mammogram repeat-6 mnths  . POLYPECTOMY    . REVERSE SHOULDER ARTHROPLASTY Right 04/21/2014   Procedure: REVERSE SHOULDER ARTHROPLASTY;  Surgeon: Tania Ade, MD;  Location: [facility];  Service: Orthopedics;  Laterality: Right;  Right reverse total shoulder replacement  . TONSILLECTOMY    . UTERINE FIBROID EMBOLIZATION     90s    Family History  Problem Relation Age of Onset  . Lung cancer Mother        smoker  . Colon cancer Father  F dx in his 56s  . Ovarian cancer Paternal Aunt        ?  . Diabetes Other        aunts-uncles   . Heart attack Other        GM in her 17s  . Stroke Other        aunts-uncles   . Breast cancer Neg Hx   . Rectal cancer Neg Hx   . Stomach cancer Neg Hx   . Esophageal cancer Neg Hx     Allergies  Allergen Reactions  . Sulfa Antibiotics Nausea Only  . Trulicity [Dulaglutide] Nausea Only  . Penicillins Rash and Other (See Comments)    Has patient had a PCN reaction causing immediate rash, facial/tongue/throat swelling, SOB or lightheadedness with hypotension: No Has patient had a PCN reaction causing severe rash involving mucus membranes or skin necrosis: No Has patient had a PCN reaction that required hospitalization No Has patient had a PCN reaction occurring within the last 10 years: No If all of the above answers are "NO", then may proceed with Cephalosporin use.     Current Outpatient Prescriptions on File Prior to Visit  Medication Sig Dispense Refill  . acetaminophen (TYLENOL) 500 MG tablet Take 500-1,000 mg by mouth every 6 (six) hours as needed for mild pain, moderate pain or headache.     Marland Kitchen aspirin EC 81 MG tablet Take 81 mg by mouth daily.     Marland Kitchen atenolol (TENORMIN) 50 MG tablet Take 0.5 tablets (25 mg total) by mouth daily. 45 tablet 1  . atorvastatin (LIPITOR) 40 MG tablet Take 1 tablet (40 mg total) by mouth daily. (Patient taking differently: Take 40 mg by mouth at bedtime. ) 90 tablet 1  . darifenacin (ENABLEX) 15 MG 24 hr tablet Take 1 tablet (15 mg total) by mouth daily. 30 tablet 6  . donepezil (ARICEPT) 10 MG tablet Take 2 tablets (20 mg total) by mouth at bedtime.    Marland Kitchen FERRO-SEQUELS 65-25 MG TBCR Take 1 tablet by mouth daily.  3  . HYDROcodone-acetaminophen (NORCO/VICODIN) 5-325 MG tablet Take 1 tablet by mouth every 8 (eight) hours as needed. 21 tablet 0  . Insulin Detemir (LEVEMIR FLEXTOUCH) 100 UNIT/ML Pen Inject 64 Units into the skin at bedtime.     Marland Kitchen losartan (COZAAR) 25 MG tablet Take 1 tablet (25 mg total) by mouth every evening. 90 tablet 1  . memantine (NAMENDA) 5 MG tablet Take 5 mg by mouth 2 (two) times daily.    . metFORMIN (GLUCOPHAGE) 1000 MG tablet Take 1 tablet (1,000 mg total) by mouth 2 (two) times daily with a meal. 180 tablet 3  . omeprazole (PRILOSEC) 40 MG capsule Take 1 capsule (40 mg total) by mouth daily. (Patient taking differently: Take 40 mg by mouth 2 (two) times daily. ) 30 capsule 9  . ondansetron (ZOFRAN) 4 MG tablet Take 1 tablet (4 mg total) by mouth every 8 (eight) hours as needed for nausea or vomiting. 30 tablet 1  . sodium fluoride (PREVIDENT 5000 DRY MOUTH) 1.1 % GEL dental gel Place 1 application onto teeth at bedtime.    . vitamin B-12 (CYANOCOBALAMIN) 1000 MCG tablet Take 1 tablet (1,000 mcg total) by mouth daily. 90 tablet 3  . vortioxetine HBr (TRINTELLIX) 20 MG TABS Take 20 mg by mouth daily.       Current Facility-Administered Medications on File Prior to Visit  Medication Dose Route Frequency Provider Last Rate Last Dose  . 0.9 %  sodium chloride infusion  500 mL Intravenous Continuous Ladene Artist, MD        BP 131/69 (BP Location: Left Arm, Cuff Size: Normal)   Pulse 60   Temp 98.4 F (36.9 C) (Oral)   Resp 16   Ht 5\' 7"  (1.702 m)   Wt 187 lb (84.8 kg)   SpO2 97%   BMI 29.29 kg/m    Objective:   Physical Exam  Constitutional: She is oriented to person, place, and time. She appears well-developed and well-nourished. No distress.  HENT:  Head: Normocephalic and atraumatic.  Right Ear: Tympanic membrane and ear canal normal.  Left Ear: Tympanic membrane and ear canal normal.  Mouth/Throat: No oropharyngeal exudate, posterior oropharyngeal edema or posterior oropharyngeal erythema.  Neck: No thyromegaly present.  Cardiovascular: Normal rate and regular rhythm.   No murmur heard. Pulmonary/Chest: Effort normal and breath sounds normal. No respiratory distress. She has no wheezes. She has no rales. She exhibits no tenderness.  Musculoskeletal: She exhibits no edema.  Lymphadenopathy:    She has no cervical adenopathy.  Neurological: She is alert and oriented to person, place, and time.  Skin: Skin is warm and dry.  Psychiatric: She has a normal mood and affect. Her behavior is normal. Judgment and thought content normal.          Assessment & Plan:   Heat intolerance/chills- etiology unclear. No real other symptoms. UA notes trace leuks so will culture and send empiric cipro. Will obtain CMET, CBC, TSH to further evaluate.

## 2016-06-21 NOTE — Patient Instructions (Signed)
Please complete lab work prior to leaving. Call if new/worsening symptoms or if symptoms do not improve.

## 2016-06-22 LAB — URINE CULTURE: Organism ID, Bacteria: NO GROWTH

## 2016-06-24 ENCOUNTER — Other Ambulatory Visit: Payer: Medicare Other

## 2016-06-24 ENCOUNTER — Other Ambulatory Visit: Payer: Self-pay | Admitting: Family

## 2016-06-24 LAB — COMPREHENSIVE METABOLIC PANEL
ALT: 15 U/L (ref 6–29)
AST: 17 U/L (ref 10–35)
Albumin: 4.1 g/dL (ref 3.6–5.1)
Alkaline Phosphatase: 63 U/L (ref 33–130)
BUN: 6 mg/dL — ABNORMAL LOW (ref 7–25)
CO2: 24 mmol/L (ref 20–31)
Calcium: 9 mg/dL (ref 8.6–10.4)
Chloride: 99 mmol/L (ref 98–110)
Creat: 0.64 mg/dL (ref 0.60–0.93)
Glucose, Bld: 124 mg/dL — ABNORMAL HIGH (ref 65–99)
Potassium: 4 mmol/L (ref 3.5–5.3)
Sodium: 133 mmol/L — ABNORMAL LOW (ref 135–146)
Total Bilirubin: 1 mg/dL (ref 0.2–1.2)
Total Protein: 7 g/dL (ref 6.1–8.1)

## 2016-06-24 LAB — CBC WITH DIFFERENTIAL/PLATELET
Basophils Absolute: 0 cells/uL (ref 0–200)
Basophils Relative: 0 %
Eosinophils Absolute: 260 cells/uL (ref 15–500)
Eosinophils Relative: 4 %
HCT: 37.4 % (ref 35.0–45.0)
Hemoglobin: 12.4 g/dL (ref 11.7–15.5)
Lymphocytes Relative: 31 %
Lymphs Abs: 2015 cells/uL (ref 850–3900)
MCH: 27.6 pg (ref 27.0–33.0)
MCHC: 33.2 g/dL (ref 32.0–36.0)
MCV: 83.3 fL (ref 80.0–100.0)
MPV: 11.3 fL (ref 7.5–12.5)
Monocytes Absolute: 390 cells/uL (ref 200–950)
Monocytes Relative: 6 %
Neutro Abs: 3835 cells/uL (ref 1500–7800)
Neutrophils Relative %: 59 %
Platelets: 212 10*3/uL (ref 140–400)
RBC: 4.49 MIL/uL (ref 3.80–5.10)
RDW: 14.2 % (ref 11.0–15.0)
WBC: 6.5 10*3/uL (ref 3.8–10.8)

## 2016-06-24 LAB — TSH: TSH: 0.38 mIU/L — ABNORMAL LOW

## 2016-06-25 ENCOUNTER — Other Ambulatory Visit: Payer: Self-pay | Admitting: Gastroenterology

## 2016-06-25 ENCOUNTER — Telehealth: Payer: Self-pay | Admitting: Internal Medicine

## 2016-06-25 ENCOUNTER — Telehealth: Payer: Self-pay | Admitting: Family

## 2016-06-25 DIAGNOSIS — E0590 Thyrotoxicosis, unspecified without thyrotoxic crisis or storm: Secondary | ICD-10-CM

## 2016-06-25 NOTE — Telephone Encounter (Signed)
Caller name: [name] Relationship to patient: Daughter Can be reached: [phone] Pharmacy:  Reason for call: Request call back to find out what the patient was told about her labs

## 2016-06-25 NOTE — Progress Notes (Incomplete)
Subjective:    Patient ID: [name], female    DOB: [birth year], 71 y.o.   MRN: [id number]  HPI   [name] is a 71 yr old female who presents today  with vague complaints of intermittent chills and then feeling overheated. She denies fever.  Denies significant fatigue.  Feels "like my thyroid is off."      Review of Systems  Constitutional: Negative for fever.  HENT: Positive for rhinorrhea.   Respiratory: Negative for cough and shortness of breath.   Gastrointestinal: Negative for blood in stool and nausea.       Reports regular BM's last few days.   Genitourinary: Negative for dysuria, frequency and hematuria.  Musculoskeletal: Negative for arthralgias and myalgias.  Skin: Negative for rash.  Neurological: Negative for headaches.  Hematological: Does not bruise/bleed easily.      see HPI  Past Medical History:  Diagnosis Date  . Adenomatous colon polyp 03/1988  . Anemia    borderline  . Arthritis    R shoulder - degenerative   . Cataract    beginning stages  . Chest pain     Sept 2011: stress test neg  . Depression    sees Dr.Cotle  . Diabetes mellitus    dr Buddy Duty  . Diverticulosis   . Fatty liver    Increased LFTs, saw GI 06/2011, likely from fatty liver   . GERD (gastroesophageal reflux disease)   . History of hiatal hernia   . Hyperlipemia   . Hypertension   . IBS (irritable bowel syndrome)    and dyspepsia  . Memory impairment 08/2014   mild cognitive impairment vs. mild dementia, reommended reinstating of cholinesterase inhibitor   . Osteopenia    dexa 06/2007 and 10/11 Rx CA vitamin d  . RLS (restless legs syndrome)    h/o- no longer using Requip     Social History   Social History  . Marital status: Widowed    Spouse name: [name]  . Number of children: 3  . Years of education: N/A   Occupational History  . 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 Smoking status: Former Smoker    Packs/day: 2.00     Years: 10.00    Types: Cigarettes    Quit date: 11/26/1975  . Smokeless tobacco: Never Used     Comment: used to smoke 2 ppd  . Alcohol use 0.0 oz/week     Comment: rarely  . Drug use: No  . Sexual activity: Not Currently   Other Topics Concern  . Not on file   Social History Narrative   Widow , lives by herself, still drives, daughter [name] lives in [place]; another daughter in [place]   5 g-children    Past Surgical History:  Procedure Laterality Date  . BREAST BIOPSY Left 10/11/2015   fibroadenoma w/ calcifications, no evidence of malignancy, recommend mammogram repeat-6 mnths  . POLYPECTOMY    . REVERSE SHOULDER ARTHROPLASTY Right 04/21/2014   Procedure: REVERSE SHOULDER ARTHROPLASTY;  Surgeon: Tania Ade, MD;  Location: [facility];  Service: Orthopedics;  Laterality: Right;  Right reverse total shoulder replacement  . TONSILLECTOMY    . UTERINE FIBROID EMBOLIZATION     90s    Family History  Problem Relation Age of Onset  . Lung cancer Mother        smoker  . Colon cancer Father  F dx in his 43s  . Ovarian cancer Paternal Aunt        ?  . Diabetes Other        aunts-uncles   . Heart attack Other        GM in her 34s  . Stroke Other        aunts-uncles   . Breast cancer Neg Hx   . Rectal cancer Neg Hx   . Stomach cancer Neg Hx   . Esophageal cancer Neg Hx     Allergies  Allergen Reactions  . Sulfa Antibiotics Nausea Only  . Trulicity [Dulaglutide] Nausea Only  . Penicillins Rash and Other (See Comments)    Has patient had a PCN reaction causing immediate rash, facial/tongue/throat swelling, SOB or lightheadedness with hypotension: No Has patient had a PCN reaction causing severe rash involving mucus membranes or skin necrosis: No Has patient had a PCN reaction that required hospitalization No Has patient had a PCN reaction occurring within the last 10 years: No If all of the above answers are "NO", then may proceed with Cephalosporin use.     Current Outpatient Prescriptions on File Prior to Visit  Medication Sig Dispense Refill  . acetaminophen (TYLENOL) 500 MG tablet Take 500-1,000 mg by mouth every 6 (six) hours as needed for mild pain, moderate pain or headache.     Marland Kitchen aspirin EC 81 MG tablet Take 81 mg by mouth daily.     Marland Kitchen atenolol (TENORMIN) 50 MG tablet Take 0.5 tablets (25 mg total) by mouth daily. 45 tablet 1  . atorvastatin (LIPITOR) 40 MG tablet Take 1 tablet (40 mg total) by mouth daily. (Patient taking differently: Take 40 mg by mouth at bedtime. ) 90 tablet 1  . darifenacin (ENABLEX) 15 MG 24 hr tablet Take 1 tablet (15 mg total) by mouth daily. 30 tablet 6  . donepezil (ARICEPT) 10 MG tablet Take 2 tablets (20 mg total) by mouth at bedtime.    Marland Kitchen FERRO-SEQUELS 65-25 MG TBCR Take 1 tablet by mouth daily.  3  . HYDROcodone-acetaminophen (NORCO/VICODIN) 5-325 MG tablet Take 1 tablet by mouth every 8 (eight) hours as needed. 21 tablet 0  . Insulin Detemir (LEVEMIR FLEXTOUCH) 100 UNIT/ML Pen Inject 64 Units into the skin at bedtime.     Marland Kitchen losartan (COZAAR) 25 MG tablet Take 1 tablet (25 mg total) by mouth every evening. 90 tablet 1  . memantine (NAMENDA) 5 MG tablet Take 5 mg by mouth 2 (two) times daily.    . metFORMIN (GLUCOPHAGE) 1000 MG tablet Take 1 tablet (1,000 mg total) by mouth 2 (two) times daily with a meal. 180 tablet 3  . omeprazole (PRILOSEC) 40 MG capsule Take 1 capsule (40 mg total) by mouth daily. (Patient taking differently: Take 40 mg by mouth 2 (two) times daily. ) 30 capsule 9  . ondansetron (ZOFRAN) 4 MG tablet Take 1 tablet (4 mg total) by mouth every 8 (eight) hours as needed for nausea or vomiting. 30 tablet 1  . sodium fluoride (PREVIDENT 5000 DRY MOUTH) 1.1 % GEL dental gel Place 1 application onto teeth at bedtime.    . vitamin B-12 (CYANOCOBALAMIN) 1000 MCG tablet Take 1 tablet (1,000 mcg total) by mouth daily. 90 tablet 3  . vortioxetine HBr (TRINTELLIX) 20 MG TABS Take 20 mg by mouth daily.       Current Facility-Administered Medications on File Prior to Visit  Medication Dose Route Frequency Provider Last Rate Last Dose  . 0.9 %  sodium chloride infusion  500 mL Intravenous Continuous Ladene Artist, MD        BP 131/69 (BP Location: Left Arm, Cuff Size: Normal)   Pulse 60   Temp 98.4 F (36.9 C) (Oral)   Resp 16   Ht 5\' 7"  (1.702 m)   Wt 187 lb (84.8 kg)   SpO2 97%   BMI 29.29 kg/m    Objective:   Physical Exam  Constitutional: She is oriented to person, place, and time. She appears well-developed and well-nourished. No distress.  HENT:  Head: Normocephalic and atraumatic.  Right Ear: Tympanic membrane and ear canal normal.  Left Ear: Tympanic membrane and ear canal normal.  Mouth/Throat: No oropharyngeal exudate, posterior oropharyngeal edema or posterior oropharyngeal erythema.  Neck: No thyromegaly present.  Cardiovascular: Normal rate and regular rhythm.   No murmur heard. Pulmonary/Chest: Effort normal and breath sounds normal. No respiratory distress. She has no wheezes. She has no rales. She exhibits no tenderness.  Musculoskeletal: She exhibits no edema.  Lymphadenopathy:    She has no cervical adenopathy.  Neurological: She is alert and oriented to person, place, and time.  Skin: Skin is warm and dry.  Psychiatric: She has a normal mood and affect. Her behavior is normal. Judgment and thought content normal.          Assessment & Plan:   Heat intolerance/chills- etiology unclear. No real other symptoms. UA notes trace leuks so will culture and send empiric cipro. Will obtain CMET, CBC, TSH to further evaluate.   Addendum:    Lab Results  Component Value Date   TSH 0.38 (L) 06/24/2016   TSH mildly low- likely cause for her above noted symptoms. Sodium mildly low at 133.  Will ask lab to add on free t3/t4. If abnormal, plan referral to endocrinology. If WNL, cons8Obtain thyroid US. She is scheduled for follow up with Dr. Larose Kells on 7/6 and can  repeat BMET at her follow up appointment.

## 2016-06-26 NOTE — Addendum Note (Signed)
Addended byDamita Dunnings D on: 06/26/2016 12:03 PM   Modules accepted: Orders

## 2016-06-26 NOTE — Telephone Encounter (Signed)
[facility] Pt's daughter of lab results and recommendations. Instructed to call if questions/concerns.

## 2016-06-26 NOTE — Telephone Encounter (Signed)
Notes recorded by Damita Dunnings, CMA on 06/25/2016 at 2:55 PM EDT Add on request faxed to main lab. LMOM informing Pt of lab results, informed that she would be called regarding scheduling thyroid ultrasound. Instructed to let me know if questions/concerns. ------  Notes recorded by Debbrah Alar, NP on 06/25/2016 at 7:18 AM EDT I would also like to have her complete a thyroid ultrasound. I have placed the order. ------  Notes recorded by Debbrah Alar, NP on 06/25/2016 at 7:17 AM EDT Please let pt know that her thyroid is mildly overactive. I would like to ask the lab to add on TSH, free T3 and free t4 please dx hyperthyroid. Blood count is normal. Liver function is normal. Sodium is mildly low- this can be repeated when she follows up with Dr. Larose Kells in July.

## 2016-06-28 LAB — T4, FREE: Free T4: 1.2 ng/dL (ref 0.8–1.8)

## 2016-06-28 LAB — T3, FREE: T3, Free: 3 pg/mL (ref 2.3–4.2)

## 2016-07-01 ENCOUNTER — Ambulatory Visit: Payer: Self-pay | Admitting: Family Medicine

## 2016-07-01 ENCOUNTER — Ambulatory Visit: Payer: Self-pay | Admitting: Gastroenterology

## 2016-07-01 ENCOUNTER — Telehealth: Payer: Self-pay | Admitting: Internal Medicine

## 2016-07-01 NOTE — Telephone Encounter (Signed)
Pt seen by Melissa on 06/21/16 in PCP absence. TSH showed to be hyperthyroid, Melissa recommended thyroid ultrasound, which is scheduled tomorrow. Please see Pt's message. Please advise.

## 2016-07-01 NOTE — Telephone Encounter (Signed)
Caller name: Krysta Bloomfield Relationship to patient: self Can be reached: [phone]  Reason for call: Pt has US thyroid tomorrow. Worried about symptoms of constant throat clearing and fullness in throat that she is having today. Pt wondering if [place] is the best route or if there may be something else better. Pt requesting call from nurse/cma.

## 2016-07-01 NOTE — Telephone Encounter (Signed)
Spoke w/ Pt, informed of PCP recommendations, Pt has appt scheduled 07/12/2016.

## 2016-07-01 NOTE — Telephone Encounter (Signed)
Agree w/ [place]. I reviewed note from [facility], sx are vague, ask pt to see me next week please (OV)

## 2016-07-02 ENCOUNTER — Ambulatory Visit: Payer: 59 | Admitting: Psychology

## 2016-07-02 ENCOUNTER — Ambulatory Visit (HOSPITAL_BASED_OUTPATIENT_CLINIC_OR_DEPARTMENT_OTHER)
Admission: RE | Admit: 2016-07-02 | Discharge: 2016-07-02 | Disposition: A | Discharge status: Home / Self Care | Payer: Medicare Other | Source: Ambulatory Visit | Attending: Family | Admitting: Family

## 2016-07-02 DIAGNOSIS — E042 Nontoxic multinodular goiter: Secondary | ICD-10-CM | POA: Insufficient documentation

## 2016-07-02 DIAGNOSIS — E0590 Thyrotoxicosis, unspecified without thyrotoxic crisis or storm: Secondary | ICD-10-CM | POA: Diagnosis not present

## 2016-07-02 NOTE — Telephone Encounter (Signed)
Opened in error

## 2016-07-03 ENCOUNTER — Telehealth: Payer: Self-pay | Admitting: Internal Medicine

## 2016-07-03 ENCOUNTER — Other Ambulatory Visit (HOSPITAL_BASED_OUTPATIENT_CLINIC_OR_DEPARTMENT_OTHER): Payer: Self-pay

## 2016-07-03 NOTE — Telephone Encounter (Signed)
°  Relation to OM:QTTC  Call back number:[phone]   Reason for call:  Patient inquiring about imaging and lab results, informed patient PCP is out of the office until Friday. Patient states she's not feeling well and would like to speak with someone prior, please advise

## 2016-07-03 NOTE — Telephone Encounter (Signed)
Called patient and left a message for call back  

## 2016-07-04 ENCOUNTER — Telehealth: Payer: Self-pay | Admitting: Internal Medicine

## 2016-07-04 DIAGNOSIS — R6889 Other general symptoms and signs: Secondary | ICD-10-CM

## 2016-07-04 DIAGNOSIS — R0989 Other specified symptoms and signs involving the circulatory and respiratory systems: Secondary | ICD-10-CM

## 2016-07-04 NOTE — Telephone Encounter (Signed)
Pt called this morning, wanting to know results of ultrasound, informed that results had not been resulted yet.

## 2016-07-04 NOTE — Telephone Encounter (Signed)
Reviewed [place] results with patient. Thyroid nodules appear stable.  Her TSH was very mildly depressed at 0.38. She has upcoming appointment with Dr. Larose Kells next week. At that time could consider repeating TSH and if still persistently low having her follow-up sooner with her endocrinologist. We did discuss radiologist's recommendation that she have a follow-up ultrasound in one year to evaluate thyroid nodule.  She does report that she has some postnasal drip. She is possibly clearing her throat. She also has sensation of throat fullness. She has been on Prilosec. We discussed adding Claritin once a day. Also setting her up to see ENT. Clinically I doubt that her fullness is related to compressive thyroid symptoms because her thyroid is not really that large. However this is possibility. I've advised her to keep her upcoming appointment with Dr. Larose Kells next week. I will also forward a copy of the ultrasound to endocrinologist Dr. Buddy Duty.

## 2016-07-04 NOTE — Telephone Encounter (Signed)
Caller name:[name] Poppe Gloriajean Dell (Daughter) Relation to pt: daughter and patient  Call back number: [phone] Pharmacy:  Reason for call:  Inquiring about US THYROID results, please advise

## 2016-07-05 ENCOUNTER — Ambulatory Visit: Payer: Self-pay | Admitting: Internal Medicine

## 2016-07-05 NOTE — Telephone Encounter (Signed)
Noted  

## 2016-07-05 NOTE — Telephone Encounter (Signed)
Pt called back in. She says that she has since spoke with her daughters. They informed her of her results. She no longer need a call back

## 2016-07-05 NOTE — Telephone Encounter (Signed)
Patient calling requesting results, requesting results be given to daughters

## 2016-07-12 ENCOUNTER — Ambulatory Visit (INDEPENDENT_AMBULATORY_CARE_PROVIDER_SITE_OTHER): Payer: Medicare Other | Admitting: Internal Medicine

## 2016-07-12 ENCOUNTER — Encounter: Payer: Self-pay | Admitting: Internal Medicine

## 2016-07-12 VITALS — BP 122/60 | HR 63 | Temp 98.2°F | Resp 14 | Ht 67.0 in | Wt 187.0 lb

## 2016-07-12 DIAGNOSIS — F0390 Unspecified dementia without behavioral disturbance: Secondary | ICD-10-CM

## 2016-07-12 DIAGNOSIS — I10 Essential (primary) hypertension: Secondary | ICD-10-CM | POA: Diagnosis not present

## 2016-07-12 NOTE — Patient Instructions (Addendum)
GO TO THE FRONT DESK Schedule your next appointment for a  Physical exam by 01-2017   Share the results with Dr Buddy Duty

## 2016-07-12 NOTE — Progress Notes (Signed)
Subjective:    Patient ID: [name], female    DOB: [birth year], 71 y.o.   MRN: [id number]  DOS:  07/12/2016 Type of visit - description : Routine checkup Interval history: In general feeling well. Dementia: Saw neurology, note reviewed Nausea: Saw GI, note reviewed. Feeling better, has changed her diet. Good compliance with PPIs twice a day. Was  seen with chills, workup reviewed, symptoms resolved. Concerned about a slightly suppressed TSH  Wt Readings from Last 3 Encounters:  07/12/16 187 lb (84.8 kg)  06/21/16 187 lb (84.8 kg)  06/06/16 185 lb 6 oz (84.1 kg)      Review of Systems For the last few weeks has noted that he she clears her throat frequently, denies classic heartburn, no postnasal dripping. No fever chills or weight loss  Past Medical History:  Diagnosis Date  . Adenomatous colon polyp 03/1988  . Anemia    borderline  . Arthritis    R shoulder - degenerative   . Cataract    beginning stages  . Chest pain     Sept 2011: stress test neg  . Depression    sees Dr.Cotle  . Diabetes mellitus    dr Buddy Duty  . Diverticulosis   . Fatty liver    Increased LFTs, saw GI 06/2011, likely from fatty liver   . GERD (gastroesophageal reflux disease)   . History of hiatal hernia   . Hyperlipemia   . Hypertension   . IBS (irritable bowel syndrome)    and dyspepsia  . Memory impairment 08/2014   mild cognitive impairment vs. mild dementia, reommended reinstating of cholinesterase inhibitor   . Osteopenia    dexa 06/2007 and 10/11 Rx CA vitamin d  . RLS (restless legs syndrome)    h/o- no longer using Requip    Past Surgical History:  Procedure Laterality Date  . BREAST BIOPSY Left 10/11/2015   fibroadenoma w/ calcifications, no evidence of malignancy, recommend mammogram repeat-6 mnths  . POLYPECTOMY    . REVERSE SHOULDER ARTHROPLASTY Right 04/21/2014   Procedure: REVERSE SHOULDER ARTHROPLASTY;  Surgeon: Tania Ade, MD;  Location: [facility];  Service:  Orthopedics;  Laterality: Right;  Right reverse total shoulder replacement  . TONSILLECTOMY    . UTERINE FIBROID EMBOLIZATION     90s    Social History   Social History  . Marital status: Widowed    Spouse name: [name]  . Number of children: 3  . Years of education: N/A   Occupational History  . 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 Smoking status: Former Smoker    Packs/day: 2.00    Years: 10.00    Types: Cigarettes    Quit date: 11/26/1975  . Smokeless tobacco: Never Used     Comment: used to smoke 2 ppd  . Alcohol use 0.0 oz/week     Comment: rarely  . Drug use: No  . Sexual activity: Not Currently   Other Topics Concern  . Not on file   Social History Narrative   Widow , lives by herself, still drives, daughter [name] lives in [place]; another daughter in [place]   5 g-children      Allergies as of 07/12/2016      Reactions   Sulfa Antibiotics Nausea Only   Trulicity [dulaglutide] Nausea Only   Penicillins Rash, Other (See Comments)   Has patient had a PCN reaction causing immediate rash, facial/tongue/throat swelling, SOB or  lightheadedness with hypotension: No Has patient had a PCN reaction causing severe rash involving mucus membranes or skin necrosis: No Has patient had a PCN reaction that required hospitalization No Has patient had a PCN reaction occurring within the last 10 years: No If all of the above answers are "NO", then may proceed with Cephalosporin use.      Medication List       Accurate as of 07/12/16 11:59 PM. Always use your most recent med list.          acetaminophen 500 MG tablet Commonly known as:  TYLENOL Take 500-1,000 mg by mouth every 6 (six) hours as needed for mild pain, moderate pain or headache.   ARICEPT 10 MG tablet Generic drug:  donepezil Take 2 tablets (20 mg total) by mouth at bedtime.   aspirin EC 81 MG tablet Take 81 mg by mouth daily.   atenolol 50 MG tablet Commonly  known as:  TENORMIN Take 0.5 tablets (25 mg total) by mouth daily.   atorvastatin 40 MG tablet Commonly known as:  LIPITOR Take 1 tablet (40 mg total) by mouth daily.   darifenacin 15 MG 24 hr tablet Commonly known as:  ENABLEX Take 1 tablet (15 mg total) by mouth daily.   FERRO-SEQUELS 65-25 MG Tbcr Generic drug:  Ferrous Fumarate-Vitamin C ER Take 1 tablet by mouth daily.   LEVEMIR FLEXTOUCH 100 UNIT/ML Pen Generic drug:  Insulin Detemir Inject 64 Units into the skin at bedtime.   losartan 25 MG tablet Commonly known as:  COZAAR Take 1 tablet (25 mg total) by mouth every evening.   memantine 5 MG tablet Commonly known as:  NAMENDA Take 5 mg by mouth 2 (two) times daily.   metFORMIN 1000 MG tablet Commonly known as:  GLUCOPHAGE Take 1 tablet (1,000 mg total) by mouth 2 (two) times daily with a meal.   omeprazole 40 MG capsule Commonly known as:  PRILOSEC Take 40 mg by mouth 2 (two) times daily.   ondansetron 4 MG tablet Commonly known as:  ZOFRAN TAKE 1 TABLET BY MOUTH EVERY 8 HOURS AS NEEDED FOR NAUSEA AND VOMITING   PREVIDENT 5000 DRY MOUTH 1.1 % Gel dental gel Generic drug:  sodium fluoride Place 1 application onto teeth at bedtime.   TRINTELLIX 20 MG Tabs Generic drug:  vortioxetine HBr Take 20 mg by mouth daily.   vitamin B-12 1000 MCG tablet Commonly known as:  CYANOCOBALAMIN Take 1 tablet (1,000 mcg total) by mouth daily.          Objective:   Physical Exam BP 122/60 (BP Location: Left Arm, Patient Position: Sitting, Cuff Size: Normal)   Pulse 63   Temp 98.2 F (36.8 C) (Oral)   Resp 14   Ht 5\' 7"  (1.702 m)   Wt 187 lb (84.8 kg)   SpO2 96%   BMI 29.29 kg/m   General:   Well developed, well nourished . NAD.  HEENT:  Normocephalic . Face symmetric, atraumatic Lungs:  CTA B Normal respiratory effort, no intercostal retractions, no accessory muscle use. Heart: RRR,  no murmur.  No pretibial edema bilaterally  Skin: Not pale. Not  jaundice Neurologic:  alert & oriented X3.  Speech normal, gait appropriate for age and unassisted Psych--  Cognition and judgment appear intact.  Cooperative with normal attention span and concentration.  Behavior appropriate. No anxious or depressed appearing.      Assessment & Plan:   Assessment  DM Dr. Buddy Duty HTN Hyperlipidemia Depression Dr. Clovis Pu  OSA -- mild, saw Dr Gwenette Greet 2014, no CPAP GI: --IBS, colon polyps, diverticulosis, hiatal hernia --Chronic constipation likely part of her IBS syndrome  --Fatty liver >>> GI eval 2013 --iron fec anemia:  cscope 04-2014 : 2 polyps; + hemocult @ GI office 10-2014: EGD done (-), bx neg Osteopenia: T score -1.1  2009 , osteopenia again per dexa 05-2013 , rx ca-vit d B12 deficiency  RLS MSK: --DJD frozen shoulder Mild cognitive impairment  MMSE 2015 --> 28, on Aricept, namenda added 04-2016 sees Dr Everette Rank  Dizziness: chronic, carotid US 2016 neg, saw cards-- not likely CV related; saw neuro DR Everette Rank 02-2015  Chest pain 09-2009, negative stress test Thyroid nodules: Incidental   by Carotid US, BX 11-2014: Atypical findings, [facility], f/u  Endocrine  PLAN: DM: Due to see endocrinology, states she will call. HTN: Seems controlled on Tenormin, last BMP satisfactory GERD: Was seen by GI 05-2016 with nausea and vomiting, they recommend PPIs twice a day long-term, good compliance, has changed her diet , sx definitely better. Wonders if she could go back on PPIs once daily. Ok to try but if she has any symptoms needs to go back to twice a day. Was seen with chills recently, sxs resolved. Dementia: Seen by neuro 04/2016, Namenda was added to Aricept. Good compliance. Thyroid nodules: Last ultrasound and TSH discussed with the patient, printed results and recommend to share them with endocrinology. To my knowledge endo is following on those issues. RTC 01-2017, CPX

## 2016-07-12 NOTE — Progress Notes (Signed)
Pre visit review using our clinic review tool, if applicable. No additional management support is needed unless otherwise documented below in the visit note. 

## 2016-07-13 NOTE — Assessment & Plan Note (Signed)
DM: Due to see endocrinology, states she will call. HTN: Seems controlled on Tenormin, last BMP satisfactory GERD: Was seen by GI 05-2016 with nausea and vomiting, they recommend PPIs twice a day long-term, good compliance, has changed her diet , sx definitely better. Wonders if she could go back on PPIs once daily. Ok to try but if she has any symptoms needs to go back to twice a day. Was seen with chills recently, sxs resolved. Dementia: Seen by neuro 04/2016, Namenda was added to Aricept. Good compliance. Thyroid nodules: Last ultrasound and TSH discussed with the patient, printed results and recommend to share them with endocrinology. To my knowledge endo is following on those issues. RTC 01-2017, CPX

## 2016-07-15 ENCOUNTER — Other Ambulatory Visit: Payer: Self-pay | Admitting: Internal Medicine

## 2016-07-16 ENCOUNTER — Ambulatory Visit: Payer: 59 | Admitting: Psychology

## 2016-07-30 ENCOUNTER — Ambulatory Visit: Payer: 59 | Admitting: Psychology

## 2016-08-06 ENCOUNTER — Encounter: Payer: Self-pay | Admitting: Gastroenterology

## 2016-08-06 ENCOUNTER — Ambulatory Visit (INDEPENDENT_AMBULATORY_CARE_PROVIDER_SITE_OTHER): Payer: Medicare Other | Admitting: Gastroenterology

## 2016-08-06 ENCOUNTER — Telehealth: Payer: Self-pay | Admitting: Gastroenterology

## 2016-08-06 ENCOUNTER — Telehealth: Payer: Self-pay | Admitting: Internal Medicine

## 2016-08-06 VITALS — BP 124/68 | HR 60 | Ht 65.25 in | Wt 186.6 lb

## 2016-08-06 DIAGNOSIS — R112 Nausea with vomiting, unspecified: Secondary | ICD-10-CM

## 2016-08-06 DIAGNOSIS — K219 Gastro-esophageal reflux disease without esophagitis: Secondary | ICD-10-CM | POA: Insufficient documentation

## 2016-08-06 NOTE — Telephone Encounter (Signed)
Patient called back and could not remember if she was told to go to the ED, tried to get CMA on phone, no answer, told patient she was per team health note.

## 2016-08-06 NOTE — Telephone Encounter (Signed)
Pt seeing Alonza Bogus, PA at GI today at 2 PM.

## 2016-08-06 NOTE — Telephone Encounter (Signed)
Patient Name: [name]  DOB: 20-Sep-1945    Initial Comment Caller states she has pain in right abdomen, vomited yesterday after lunch suddenly, done it a few times. Right upper abdominal pain, and wondering what to do.    Nurse Assessment  Nurse: Raphael Gibney, RN, Vanita Ingles Date/Time (Eastern Time): 08/06/2016 10:39:37 AM  Confirm and document reason for call. If symptomatic, describe symptoms. ---Caller states she is having pain in her right upper abd. Has been vomiting off and on for the past 2 weeks. Vomited x 1 yesterday and she is nauseated. Has vomited several times over the past 2 weeks. Pain is constant. Pain level 7.  Does the patient have any new or worsening symptoms? ---Yes  Will a triage be completed? ---Yes  Related visit to physician within the last 2 weeks? ---No  Does the PT have any chronic conditions? (i.e. diabetes, asthma, etc.) ---Yes  List chronic conditions. ---diabetes  Is this a behavioral health or substance abuse call? ---No     Guidelines    Guideline Title Affirmed Question Affirmed Notes  Abdominal Pain - Upper [1] Pain lasts > 10 minutes AND [2] age > 12    Final Disposition User   Go to ED Now Raphael Gibney, RN, Vanita Ingles    Comments  pt states she does not want to go to the ER but will call Dr. Fuller Plan her GI doctor.  called office and spoke to [facility] and gave report that pt is having upper right abd pain that is constant and pain level 7. Vomited x 1 yesterday and nauseated now. Triage outcome of go to ER now but pt states she wants to call her GI doctor Dr. Fuller Plan first   Referrals  GO TO FACILITY REFUSED   Disagree/Comply: Disagree  Disagree/Comply Reason: Disagree with instructions

## 2016-08-06 NOTE — Progress Notes (Signed)
08/06/2016 [name] [id number] May 03, 1945   HISTORY OF PRESENT ILLNESS:  This is a 71 year old female who is known to Dr. Fuller Plan. She presents to our office today with complaints of intermittent vomiting episodes. She does have some dementia so she called her daughter and put her on speaker phone during our visit so that she could help provide information. The report that she's had these symptoms for the past 10-12 months.  Usually occur randomly, but recent episode was after a very large meal.  She was seen here by Dr. Fuller Plan for similar complaints in May at which time he reported the following:  "The patient did very well for several months on omeprazole 40 mg twice a day with no problems with GERD, nausea or vomiting. Patient decided to discontinue omeprazole since she was feeling better and her nausea and vomiting returned about one month ago. She resumed omeprazole 40 mg twice daily and although her symptoms have improved she still notes intermittent nausea and vomiting."  Apparently this has continued. Her PPI was recently switched to pantoprazole 40 mg twice a day to see if change would help. She tells me that her hemoglobin A1c was recently between 7 and 8%. Has been diabetic for 10 years. She does complain of some recent headaches on the very top of her head. Is unsure if that is related. Also reports some right flank abdominal pain this morning that resolved after taking Tylenol and ibuprofen.    Past Medical History:  Diagnosis Date  . Adenomatous colon polyp 03/1988  . Anemia    borderline  . Arthritis    R shoulder - degenerative   . Cataract    beginning stages  . Chest pain     Sept 2011: stress test neg  . Depression    sees Dr.Cotle  . Diabetes mellitus    dr Buddy Duty  . Diverticulosis   . Fatty liver    Increased LFTs, saw GI 06/2011, likely from fatty liver   . GERD (gastroesophageal reflux disease)   . History of hiatal hernia   . Hyperlipemia   .  Hypertension   . IBS (irritable bowel syndrome)    and dyspepsia  . Memory impairment 08/2014   mild cognitive impairment vs. mild dementia, reommended reinstating of cholinesterase inhibitor   . Osteopenia    dexa 06/2007 and 10/11 Rx CA vitamin d  . RLS (restless legs syndrome)    h/o- no longer using Requip   Past Surgical History:  Procedure Laterality Date  . BREAST BIOPSY Left 10/11/2015   fibroadenoma w/ calcifications, no evidence of malignancy, recommend mammogram repeat-6 mnths  . POLYPECTOMY    . REVERSE SHOULDER ARTHROPLASTY Right 04/21/2014   Procedure: REVERSE SHOULDER ARTHROPLASTY;  Surgeon: Tania Ade, MD;  Location: [facility];  Service: Orthopedics;  Laterality: Right;  Right reverse total shoulder replacement  . TONSILLECTOMY    . UTERINE FIBROID EMBOLIZATION     90s    reports that she quit smoking about 40 years ago. Her smoking use included Cigarettes. She has a 20.00 pack-year smoking history. She has never used smokeless tobacco. She reports that she drinks alcohol. She reports that she does not use drugs. family history includes Colon cancer in her father; Diabetes in her other; Heart attack in her other; Lung cancer in her mother; Ovarian cancer in her paternal aunt; Stroke in her other. Allergies  Allergen Reactions  . Sulfa Antibiotics Nausea Only  . Trulicity [Dulaglutide] Nausea Only  .  Penicillins Rash and Other (See Comments)    Has patient had a PCN reaction causing immediate rash, facial/tongue/throat swelling, SOB or lightheadedness with hypotension: No Has patient had a PCN reaction causing severe rash involving mucus membranes or skin necrosis: No Has patient had a PCN reaction that required hospitalization No Has patient had a PCN reaction occurring within the last 10 years: No If all of the above answers are "NO", then may proceed with Cephalosporin use.      Outpatient Encounter Prescriptions as of 08/06/2016  Medication Sig  . acetaminophen  (TYLENOL) 500 MG tablet Take 500-1,000 mg by mouth every 6 (six) hours as needed for mild pain, moderate pain or headache.   Marland Kitchen aspirin EC 81 MG tablet Take 81 mg by mouth daily.   Marland Kitchen atenolol (TENORMIN) 50 MG tablet Take 0.5 tablets (25 mg total) by mouth daily.  Marland Kitchen atorvastatin (LIPITOR) 40 MG tablet Take 1 tablet (40 mg total) by mouth daily.  Marland Kitchen darifenacin (ENABLEX) 15 MG 24 hr tablet Take 1 tablet (15 mg total) by mouth daily.  Marland Kitchen donepezil (ARICEPT) 10 MG tablet Take 2 tablets (20 mg total) by mouth at bedtime.  . Ferrous Fumarate-Vitamin C ER (FERRO-SEQUELS) 65-25 MG TBCR Take 1 tablet by mouth daily.  . Insulin Detemir (LEVEMIR FLEXTOUCH) 100 UNIT/ML Pen Inject 64 Units into the skin at bedtime.   Marland Kitchen losartan (COZAAR) 25 MG tablet Take 1 tablet (25 mg total) by mouth every evening.  . memantine (NAMENDA) 5 MG tablet Take 5 mg by mouth 2 (two) times daily.  . metFORMIN (GLUCOPHAGE) 1000 MG tablet Take 1 tablet (1,000 mg total) by mouth 2 (two) times daily with a meal.  . ondansetron (ZOFRAN) 4 MG tablet TAKE 1 TABLET BY MOUTH EVERY 8 HOURS AS NEEDED FOR NAUSEA AND VOMITING  . pantoprazole (PROTONIX) 40 MG tablet Take 40 mg by mouth 2 (two) times daily.  . sodium fluoride (PREVIDENT 5000 DRY MOUTH) 1.1 % GEL dental gel Place 1 application onto teeth at bedtime.  . vitamin B-12 (CYANOCOBALAMIN) 1000 MCG tablet Take 1 tablet (1,000 mcg total) by mouth daily.  Marland Kitchen vortioxetine HBr (TRINTELLIX) 20 MG TABS Take 20 mg by mouth daily.   . [DISCONTINUED] omeprazole (PRILOSEC) 40 MG capsule Take 40 mg by mouth 2 (two) times daily.   Facility-Administered Encounter Medications as of 08/06/2016  Medication  . 0.9 %  sodium chloride infusion     REVIEW OF SYSTEMS  : All other systems reviewed and negative except where noted in the History of Present Illness.   PHYSICAL EXAM: BP 124/68 (BP Location: Left Arm, Patient Position: Sitting, Cuff Size: Normal)   Pulse 60   Ht 5' 5.25" (1.657 m)   Wt 186 lb  9.6 oz (84.6 kg)   SpO2 98%   BMI 30.81 kg/m  General: Well developed white female in no acute distress Head: Normocephalic and atraumatic Eyes:  Sclerae anicteric, conjunctiva pink. Ears: Normal auditory acuity Lungs: Clear throughout to auscultation; no increased WOB. Heart: Regular rate and rhythm; no M/R/G. Abdomen: Soft, non-distended.  BS present.  Mild epigastric TTP. Musculoskeletal: Symmetrical with no gross deformities  Skin: No lesions on visible extremities Extremities: No edema  Neurological: Alert oriented x 4, grossly non-focal Psychological:  Alert and cooperative. Normal mood and affect  ASSESSMENT AND PLAN: -Intermittent vomiting episodes:  ? If she has gastroparesis that is also exacerbating her reflux issues.  Will order GES.  Discussed reflux and gastroparesis diet, literature given.  Continue  pantoprazole 40 mg BID for now as she was just switched to this medication.  If GES is normal and symptoms continue could consider CT scan of the abdomen and pelvis.  ? Head CT as she is also complaining about recent headaches.   CC:  Colon Branch, MD

## 2016-08-06 NOTE — Progress Notes (Signed)
Reviewed and agree with initial management plan.  Sindi Beckworth T. Fiona Coto, MD FACG 

## 2016-08-06 NOTE — Telephone Encounter (Signed)
Patient reports that she has had several episodes of vomiting and RUQ pain over the last few weeks.  She will come in and see Alonza Bogus, PA today at 2:00

## 2016-08-06 NOTE — Patient Instructions (Addendum)
We have given you a gastroparesis diet.   Take Pantoprazole 40 mg twice a day.   You have been scheduled for a gastric emptying scan at [facility] on 08/19/16 at 7:30 am. Please arrive at least 15 minutes prior to your appointment for registration. Please make certain not to have anything to eat or drink after midnight the night before your test. Hold all stomach medications (ex: Zofran, phenergan, Reglan, Pantoprazole) 8 hours prior to your test. If you need to reschedule your appointment, please contact radiology scheduling at [phone]. _____________________________________________________________________ A gastric-emptying study measures how long it takes for food to move through your stomach. There are several ways to measure stomach emptying. In the most common test, you eat food that contains a small amount of radioactive material. A scanner that detects the movement of the radioactive material is placed over your abdomen to monitor the rate at which food leaves your stomach. This test normally takes about 4 hours to complete. _____________________________________________________________________

## 2016-08-06 NOTE — Telephone Encounter (Signed)
Spoke w/ [facility], familiar w/ Pt, informed no fever, pain was 7/10, per EPIC Pt has already reached out to GI- will keep eye out on chart for what they recommend, if no advice will check on her again at lunch.

## 2016-08-08 ENCOUNTER — Ambulatory Visit (INDEPENDENT_AMBULATORY_CARE_PROVIDER_SITE_OTHER): Payer: Medicare Other | Admitting: Internal Medicine

## 2016-08-08 ENCOUNTER — Telehealth: Payer: Self-pay | Admitting: Internal Medicine

## 2016-08-08 ENCOUNTER — Encounter: Payer: Self-pay | Admitting: Internal Medicine

## 2016-08-08 ENCOUNTER — Ambulatory Visit (HOSPITAL_COMMUNITY)
Admission: RE | Admit: 2016-08-08 | Discharge: 2016-08-08 | Disposition: A | Discharge status: Home / Self Care | Payer: Medicare Other | Source: Ambulatory Visit | Attending: Internal Medicine | Admitting: Internal Medicine

## 2016-08-08 VITALS — BP 124/78 | HR 58 | Temp 97.7°F | Resp 14 | Ht 65.0 in | Wt 191.1 lb

## 2016-08-08 DIAGNOSIS — R519 Headache, unspecified: Secondary | ICD-10-CM

## 2016-08-08 DIAGNOSIS — E871 Hypo-osmolality and hyponatremia: Secondary | ICD-10-CM | POA: Diagnosis not present

## 2016-08-08 DIAGNOSIS — R51 Headache: Secondary | ICD-10-CM | POA: Insufficient documentation

## 2016-08-08 LAB — BASIC METABOLIC PANEL
BUN: 5 mg/dL — ABNORMAL LOW (ref 6–23)
CO2: 26 mEq/L (ref 19–32)
Calcium: 8.8 mg/dL (ref 8.4–10.5)
Chloride: 92 mEq/L — ABNORMAL LOW (ref 96–112)
Creatinine, Ser: 0.64 mg/dL (ref 0.40–1.20)
GFR: 97.09 mL/min (ref >60.00)
Glucose, Bld: 119 mg/dL — ABNORMAL HIGH (ref 70–99)
Potassium: 4.4 mEq/L (ref 3.5–5.1)
Sodium: 125 mEq/L — ABNORMAL LOW (ref 135–145)

## 2016-08-08 LAB — SEDIMENTATION RATE: Sed Rate: 8 mm/hr (ref 0–30)

## 2016-08-08 MED ORDER — GADOBENATE DIMEGLUMINE 529 MG/ML IV SOLN
10.0000 mL | Freq: Once | INTRAVENOUS | Status: AC | PRN
  Administered 2016-08-08: 10 mL via INTRAVENOUS

## 2016-08-08 MED ORDER — LANSOPRAZOLE 30 MG PO CPDR: 30.0000 mg | DELAYED_RELEASE_CAPSULE | Freq: Two times a day (BID) | ORAL | 3 refills | Status: DC

## 2016-08-08 NOTE — Patient Instructions (Signed)
GO TO THE LAB : Get the blood work      Stop pantoprazole  Start Prevacid (lansoprazole) 30 mg one tablet twice a day.  If your headache is worse please let me know.

## 2016-08-08 NOTE — Telephone Encounter (Signed)
Wanted to know if PCP still planned MRI as discussed at [facility] today, informed plan was for him to discuss w/ radiology first and then place MRI orders, informed she would receive call to schedule or if plan changes. Pt verbalized understanding.

## 2016-08-08 NOTE — Progress Notes (Signed)
Pre visit review using our clinic review tool, if applicable. No additional management support is needed unless otherwise documented below in the visit note. 

## 2016-08-08 NOTE — Telephone Encounter (Signed)
Pt called in to be advised she have a question about her apt.    CB: [phone]

## 2016-08-08 NOTE — Progress Notes (Signed)
Subjective:    Patient ID: [name], female    DOB: 1945/11/21, 71 y.o.   MRN: [id number]  DOS:  08/08/2016 Type of visit - description : acute Interval history: Here for evaluation of headaches. Her daughter [name] is in the phone and participate in the visit. Stutter with daily headaches about 2 or 3 weeks ago, the headaches may last few hours. Is located at the top of the head.. Although is not "the worse headache of her life" this is very unusual. We reviewed her medications and interestingly she started to take pantoprazole around the time the headache started. OTC Aleve, Tylenol - no much help  She has nausea for several months, worse lately, saw GI, they Rx a GES, they were somewhat concerned about the nausea and concomitant headache.   Review of Systems Denies fever chills. No unusual aches and pains.  lost some weight over the last year. No recent head injury. Some neck pain? Denies visual disturbances, amaurosis fugax. No slurred speech, facial numbness. Mild sinus congestion.  Past Medical History:  Diagnosis Date  . Adenomatous colon polyp 03/1988  . Anemia    borderline  . Arthritis    R shoulder - degenerative   . Cataract    beginning stages  . Chest pain     Sept 2011: stress test neg  . Depression    sees Dr.Cotle  . Diabetes mellitus    dr Buddy Duty  . Diverticulosis   . Fatty liver    Increased LFTs, saw GI 06/2011, likely from fatty liver   . GERD (gastroesophageal reflux disease)   . History of hiatal hernia   . Hyperlipemia   . Hypertension   . IBS (irritable bowel syndrome)    and dyspepsia  . Memory impairment 08/2014   mild cognitive impairment vs. mild dementia, reommended reinstating of cholinesterase inhibitor   . Osteopenia    dexa 06/2007 and 10/11 Rx CA vitamin d  . RLS (restless legs syndrome)    h/o- no longer using Requip    Past Surgical History:  Procedure Laterality Date  . BREAST BIOPSY Left 10/11/2015   fibroadenoma w/  calcifications, no evidence of malignancy, recommend mammogram repeat-6 mnths  . POLYPECTOMY    . REVERSE SHOULDER ARTHROPLASTY Right 04/21/2014   Procedure: REVERSE SHOULDER ARTHROPLASTY;  Surgeon: Tania Ade, MD;  Location: [facility];  Service: Orthopedics;  Laterality: Right;  Right reverse total shoulder replacement  . TONSILLECTOMY    . UTERINE FIBROID EMBOLIZATION     90s    Social History   Social History  . Marital status: Widowed    Spouse name: [name]  . Number of children: 3  . Years of education: N/A   Occupational History  . 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 Smoking status: Former Smoker    Packs/day: 2.00    Years: 10.00    Types: Cigarettes    Quit date: 11/26/1975  . Smokeless tobacco: Never Used     Comment: used to smoke 2 ppd  . Alcohol use 0.0 oz/week     Comment: rarely  . Drug use: No  . Sexual activity: Not Currently   Other Topics Concern  . Not on file   Social History Narrative   Widow , lives by herself, still drives, daughter [name] lives in [place]; another daughter in [place]   5 g-children      Allergies as of 08/08/2016  Reactions   Sulfa Antibiotics Nausea Only   Trulicity [dulaglutide] Nausea Only   Penicillins Rash, Other (See Comments)   Has patient had a PCN reaction causing immediate rash, facial/tongue/throat swelling, SOB or lightheadedness with hypotension: No Has patient had a PCN reaction causing severe rash involving mucus membranes or skin necrosis: No Has patient had a PCN reaction that required hospitalization No Has patient had a PCN reaction occurring within the last 10 years: No If all of the above answers are "NO", then may proceed with Cephalosporin use.      Medication List       Accurate as of 08/08/16 11:59 PM. Always use your most recent med list.          acetaminophen 500 MG tablet Commonly known as:  TYLENOL Take 500-1,000 mg by mouth every 6 (six) hours  as needed for mild pain, moderate pain or headache.   ARICEPT 10 MG tablet Generic drug:  donepezil Take 2 tablets (20 mg total) by mouth at bedtime.   aspirin EC 81 MG tablet Take 81 mg by mouth daily.   atenolol 50 MG tablet Commonly known as:  TENORMIN Take 0.5 tablets (25 mg total) by mouth daily.   atorvastatin 40 MG tablet Commonly known as:  LIPITOR Take 1 tablet (40 mg total) by mouth daily.   darifenacin 15 MG 24 hr tablet Commonly known as:  ENABLEX Take 1 tablet (15 mg total) by mouth daily.   Ferrous Fumarate-Vitamin C ER 65-25 MG Tbcr Commonly known as:  FERRO-SEQUELS Take 1 tablet by mouth daily.   lansoprazole 30 MG capsule Commonly known as:  PREVACID Take 1 capsule (30 mg total) by mouth 2 (two) times daily before a meal.   LEVEMIR FLEXTOUCH 100 UNIT/ML Pen Generic drug:  Insulin Detemir Inject 64 Units into the skin at bedtime.   losartan 25 MG tablet Commonly known as:  COZAAR Take 1 tablet (25 mg total) by mouth every evening.   memantine 5 MG tablet Commonly known as:  NAMENDA Take 5 mg by mouth 2 (two) times daily.   metFORMIN 1000 MG tablet Commonly known as:  GLUCOPHAGE Take 1 tablet (1,000 mg total) by mouth 2 (two) times daily with a meal.   ondansetron 4 MG tablet Commonly known as:  ZOFRAN TAKE 1 TABLET BY MOUTH EVERY 8 HOURS AS NEEDED FOR NAUSEA AND VOMITING   PREVIDENT 5000 DRY MOUTH 1.1 % Gel dental gel Generic drug:  sodium fluoride Place 1 application onto teeth at bedtime.   TRINTELLIX 20 MG Tabs Generic drug:  vortioxetine HBr Take 20 mg by mouth daily.   vitamin B-12 1000 MCG tablet Commonly known as:  CYANOCOBALAMIN Take 1 tablet (1,000 mcg total) by mouth daily.          Objective:   Physical Exam BP 124/78 (BP Location: Left Arm, Patient Position: Sitting, Cuff Size: Normal)   Pulse (!) 58   Temp 97.7 F (36.5 C) (Oral)   Resp 14   Ht 5\' 5"  (1.651 m)   Wt 191 lb 2 oz (86.7 kg)   SpO2 98%   BMI 31.80  kg/m  General:   Well developed, well nourished . NAD.  HEENT:  Normocephalic . Face symmetric, atraumatic. Temples note TTP, good T.A.pulses Neck: Normal carotid pulses Lungs:  CTA B Normal respiratory effort, no intercostal retractions, no accessory muscle use. Heart: RRR,  no murmur.  No pretibial edema bilaterally  Skin: Not pale. Not jaundice Neurologic:  alert & oriented  X3.  Speech normal, gait appropriate for age and unassisted. Motor and DTRs symmetric. EOMI. Psych--  Cognition and judgment appear intact.  Cooperative with normal attention span and concentration.  Behavior appropriate. No anxious or depressed appearing.      Assessment & Plan:   Assessment  DM Dr. Buddy Duty HTN Hyperlipidemia Depression Dr. Clovis Pu OSA -- mild, saw Dr Gwenette Greet 2014, no CPAP GI: --IBS, colon polyps, diverticulosis, hiatal hernia --Chronic constipation likely part of her IBS syndrome  --Fatty liver >>> GI eval 2013 --iron fec anemia:  cscope 04-2014 : 2 polyps; + hemocult @ GI office 10-2014: EGD done (-), bx neg Osteopenia: T score -1.1  2009 , osteopenia again per dexa 05-2013 , rx ca-vit d B12 deficiency  RLS MSK: --DJD frozen shoulder Mild cognitive impairment  MMSE 2015 --> 28, on Aricept, namenda added 04-2016 sees Dr Everette Rank  Dizziness: chronic, carotid US 2016 neg, saw cards-- not likely CV related; saw neuro DR Everette Rank 02-2015  Chest pain 09-2009, negative stress test Thyroid nodules: Incidental   by Carotid US, BX 11-2014: Atypical findings, [facility], f/u  Endocrine  PLAN: Headache: Presents with headache as described above associated with increased nausea from baseline. Neurological exam is nonfocal. Symptoms coincided with initiation of pantoprazole. Side effect?. Need to r/o a IC process , will get a MRI of the brain with and without (d/w rads) Stop pantoprazole, start Prevacid. Get the BMP and sedimentation rate. ER if worse. Addendum: Hyponatremia noted on labs, will  repeat labs and order additional tests in few days. MRI was negative.

## 2016-08-11 ENCOUNTER — Emergency Department (HOSPITAL_BASED_OUTPATIENT_CLINIC_OR_DEPARTMENT_OTHER): Payer: Medicare Other

## 2016-08-11 ENCOUNTER — Emergency Department (HOSPITAL_BASED_OUTPATIENT_CLINIC_OR_DEPARTMENT_OTHER)
Admission: EM | Admit: 2016-08-11 | Discharge: 2016-08-11 | Disposition: A | Discharge status: Home / Self Care | Payer: Medicare Other | Attending: Emergency Medicine | Admitting: Emergency Medicine

## 2016-08-11 ENCOUNTER — Encounter (HOSPITAL_BASED_OUTPATIENT_CLINIC_OR_DEPARTMENT_OTHER): Payer: Self-pay | Admitting: Adult Health

## 2016-08-11 DIAGNOSIS — Z7982 Long term (current) use of aspirin: Secondary | ICD-10-CM | POA: Diagnosis not present

## 2016-08-11 DIAGNOSIS — M5432 Sciatica, left side: Secondary | ICD-10-CM

## 2016-08-11 DIAGNOSIS — Z87891 Personal history of nicotine dependence: Secondary | ICD-10-CM | POA: Diagnosis not present

## 2016-08-11 DIAGNOSIS — Z794 Long term (current) use of insulin: Secondary | ICD-10-CM | POA: Diagnosis not present

## 2016-08-11 DIAGNOSIS — E119 Type 2 diabetes mellitus without complications: Secondary | ICD-10-CM | POA: Diagnosis not present

## 2016-08-11 DIAGNOSIS — Z79891 Long term (current) use of opiate analgesic: Secondary | ICD-10-CM | POA: Diagnosis not present

## 2016-08-11 DIAGNOSIS — I10 Essential (primary) hypertension: Secondary | ICD-10-CM | POA: Diagnosis not present

## 2016-08-11 DIAGNOSIS — F0390 Unspecified dementia without behavioral disturbance: Secondary | ICD-10-CM | POA: Insufficient documentation

## 2016-08-11 DIAGNOSIS — M25552 Pain in left hip: Secondary | ICD-10-CM | POA: Diagnosis present

## 2016-08-11 MED ORDER — FENTANYL CITRATE (PF) 100 MCG/2ML IJ SOLN
50.0000 ug | Freq: Once | INTRAMUSCULAR | Status: AC
  Administered 2016-08-11: 50 ug via INTRAVENOUS
  Filled 2016-08-11: qty 2

## 2016-08-11 MED ORDER — FENTANYL CITRATE (PF) 100 MCG/2ML IJ SOLN
25.0000 ug | Freq: Once | INTRAMUSCULAR | Status: AC
  Administered 2016-08-11: 25 ug via INTRAVENOUS
  Filled 2016-08-11: qty 2

## 2016-08-11 MED ORDER — CYCLOBENZAPRINE HCL 10 MG PO TABS: 10.0000 mg | ORAL_TABLET | Freq: Every evening | ORAL | 0 refills | Status: DC | PRN

## 2016-08-11 MED ORDER — OXYCODONE-ACETAMINOPHEN 5-325 MG PO TABS
1.0000 | ORAL_TABLET | ORAL | Status: DC | PRN
  Filled 2016-08-11: qty 1

## 2016-08-11 MED ORDER — KETOROLAC TROMETHAMINE 60 MG/2ML IM SOLN
15.0000 mg | Freq: Once | INTRAMUSCULAR | Status: AC
  Administered 2016-08-11: 15 mg via INTRA_ARTICULAR
  Filled 2016-08-11: qty 2

## 2016-08-11 NOTE — ED Triage Notes (Signed)
PResents with left hip/back pain that began yesterday-pain is located in lower buttock area, does not radiate, denies injury but is having difficulty ambulating as normal  CMS intact, denies numbness and tingling. Pain is worse with movemetn.

## 2016-08-11 NOTE — ED Notes (Signed)
Pt's family out to desk , asking for pain medication. RN made aware.

## 2016-08-11 NOTE — ED Notes (Signed)
ED Provider at bedside. 

## 2016-08-11 NOTE — ED Notes (Signed)
Patient transported to X-ray 

## 2016-08-11 NOTE — Discharge Instructions (Signed)
As discussed, your symptoms may be due to sciatica which is irritation of the nerve near your left hip and can cause pain in the left buttocks like you describe. You can use heat, massage, nsaids such as alieve, and modified exercises to help relieve symptoms. Muscle relaxer at night time. Do not drive or operate machinery while on this medication.  Follow-up with your primary care provider if symptoms persist. Return if you experience numbness, weakness, loss of bowel or bladder function, fever, chills, worsening back pain or any new concerning symptoms in the meantime.

## 2016-08-11 NOTE — ED Provider Notes (Signed)
[facility] Provider Note   CSN: [id number] Arrival date & time: 08/11/16  1501     History   Chief Complaint Chief Complaint  Patient presents with  . Hip Pain    HPI TORRIN FREIN is a 71 y.o. female presenting with progressive onset of left sided hip pain into left buttocks. Patient's daughter is in the room helping with history and reports they were at the movies last night and she started feeling discomfort while walking on it and this morning was experiencing worsening pain when she woke up she took Tylenol and Advil with modest relief. She proceeded to drive herself to church and went through her day.She went to lunch with her daughter and the pain progressively worsened and daughter took her here to be evaluated. She denies numbness, history of malignancy, fever, chills, loss of bowel or bladder function or other symptoms.   HPI  Past Medical History:  Diagnosis Date  . Adenomatous colon polyp 03/1988  . Anemia    borderline  . Arthritis    R shoulder - degenerative   . Cataract    beginning stages  . Chest pain     Sept 2011: stress test neg  . Depression    sees Dr.Cotle  . Diabetes mellitus    dr Buddy Duty  . Diverticulosis   . Fatty liver    Increased LFTs, saw GI 06/2011, likely from fatty liver   . GERD (gastroesophageal reflux disease)   . History of hiatal hernia   . Hyperlipemia   . Hypertension   . IBS (irritable bowel syndrome)    and dyspepsia  . Memory impairment 08/2014   mild cognitive impairment vs. mild dementia, reommended reinstating of cholinesterase inhibitor   . Osteopenia    dexa 06/2007 and 10/11 Rx CA vitamin d  . RLS (restless legs syndrome)    h/o- no longer using Requip    Patient Active Problem List   Diagnosis Date Noted  . Nausea and vomiting 08/06/2016  . Gastroesophageal reflux disease 08/06/2016  . PCP Notes >>>>>>>>>>>>>>>>> 09/21/2014  . Rotator cuff tear arthropathy 04/21/2014  . Abnormal MRI, shoulder  12/01/2013  . 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 Cervical radiculitis 11/10/2013  . 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 Bursitis of right shoulder 08/19/2013  . Primary localized osteoarthrosis, lower leg 08/19/2013  . Vitamin B 12 deficiency 05/20/2013  . OSA (obstructive sleep apnea) 02/13/2012  . Pulmonary nodule, last  CT 2016, stable, no f/u 01/28/2012  . Dementia 05/29/2011  . Fatty liver 02/27/2011  . Annual physical exam 11/26/2010  . Osteopenia 10/09/2007  . COLONIC POLYPS 08/22/2006  . DYSPEPSIA 08/22/2006  . DM II (diabetes mellitus, type II), controlled ([place]) 05/19/2006  . Hyperlipidemia 05/19/2006  . Depression 05/19/2006  . SYNDROME, RESTLESS LEGS 05/19/2006  . Essential hypertension 05/19/2006  . IBS -- constipation 05/19/2006    Past Surgical History:  Procedure Laterality Date  . BREAST BIOPSY Left 10/11/2015   fibroadenoma w/ calcifications, no evidence of malignancy, recommend mammogram repeat-6 mnths  . POLYPECTOMY    . REVERSE SHOULDER ARTHROPLASTY Right 04/21/2014   Procedure: REVERSE SHOULDER ARTHROPLASTY;  Surgeon: Tania Ade, MD;  Location: [facility];  Service: Orthopedics;  Laterality: Right;  Right reverse total shoulder replacement  . TONSILLECTOMY    . UTERINE FIBROID EMBOLIZATION     90s    OB History    No data available       Home Medications    Prior to Admission  medications   Medication Sig Start Date End Date Taking? Authorizing Provider  acetaminophen (TYLENOL) 500 MG tablet Take 500-1,000 mg by mouth every 6 (six) hours as needed for mild pain, moderate pain or headache.     [provider]  aspirin EC 81 MG tablet Take 81 mg by mouth daily.     [provider]  atenolol (TENORMIN) 50 MG tablet Take 0.5 tablets (25 mg total) by mouth daily. 05/05/15   Colon Branch, MD  atorvastatin (LIPITOR) 40 MG tablet Take 1 tablet (40 mg total) by mouth daily. 07/15/16   Colon Branch, MD  cyclobenzaprine (FLEXERIL) 10 MG  tablet Take 1 tablet (10 mg total) by mouth at bedtime as needed for muscle spasms. 08/11/16   Emeline General, PA-C  darifenacin (ENABLEX) 15 MG 24 hr tablet Take 1 tablet (15 mg total) by mouth daily. 06/17/16   Colon Branch, MD  donepezil (ARICEPT) 10 MG tablet Take 2 tablets (20 mg total) by mouth at bedtime.    Colon Branch, MD  Ferrous Fumarate-Vitamin C ER (FERRO-SEQUELS) 65-25 MG TBCR Take 1 tablet by mouth daily. 07/15/16   Colon Branch, MD  Insulin Detemir (LEVEMIR FLEXTOUCH) 100 UNIT/ML Pen Inject 64 Units into the skin at bedtime.     Delrae Rend, MD  lansoprazole (PREVACID) 30 MG capsule Take 1 capsule (30 mg total) by mouth 2 (two) times daily before a meal. 08/08/16   Colon Branch, MD  losartan (COZAAR) 25 MG tablet Take 1 tablet (25 mg total) by mouth every evening. 03/25/16   Colon Branch, MD  memantine (NAMENDA) 5 MG tablet Take 5 mg by mouth 2 (two) times daily. 05/20/16   [provider]  metFORMIN (GLUCOPHAGE) 1000 MG tablet Take 1 tablet (1,000 mg total) by mouth 2 (two) times daily with a meal. 07/27/13   Philemon Kingdom, MD  ondansetron (ZOFRAN) 4 MG tablet TAKE 1 TABLET BY MOUTH EVERY 8 HOURS AS NEEDED FOR NAUSEA AND VOMITING 06/25/16   Ladene Artist, MD  sodium fluoride (PREVIDENT 5000 DRY MOUTH) 1.1 % GEL dental gel Place 1 application onto teeth at bedtime.    [provider]  vitamin B-12 (CYANOCOBALAMIN) 1000 MCG tablet Take 1 tablet (1,000 mcg total) by mouth daily. 08/24/15   Colon Branch, MD  vortioxetine HBr (TRINTELLIX) 20 MG TABS Take 20 mg by mouth daily.     [provider]    Family History Family History  Problem Relation Age of Onset  . Lung cancer Mother        smoker  . Colon cancer Father        F dx in his 87s  . Ovarian cancer Paternal Aunt        ?  . Diabetes Other        aunts-uncles   . Heart attack Other        GM in her 56s  . Stroke Other        aunts-uncles   . Breast cancer Neg Hx   . Rectal cancer Neg Hx   .  Stomach cancer Neg Hx   . Esophageal cancer Neg Hx     Social History Social History  Substance Use Topics  . Smoking status: Former Smoker    Packs/day: 2.00    Years: 10.00    Types: Cigarettes    Quit date: 11/26/1975  . Smokeless tobacco: Never Used     Comment: used to  smoke 2 ppd  . Alcohol use 0.0 oz/week     Comment: rarely     Allergies   Sulfa antibiotics; Trulicity [dulaglutide]; and Penicillins   Review of Systems Review of Systems  Constitutional: Negative for appetite change, chills, fatigue, fever and unexpected weight change.  Gastrointestinal: Negative for abdominal distention, abdominal pain, blood in stool, diarrhea, nausea and vomiting.       No loss of bowel function  Genitourinary: Negative for difficulty urinating, dysuria, flank pain and frequency.       No loss of bladder function  Musculoskeletal: Positive for arthralgias and myalgias. Negative for joint swelling.  Skin: Negative for color change and pallor.  Neurological: Negative for tremors, weakness and numbness.     Physical Exam Updated Vital Signs BP (!) 144/73 (BP Location: Left Arm)   Pulse (!) 59   Temp 98.3 F (36.8 C) (Oral)   Resp 16   SpO2 98%   Physical Exam  Constitutional: She appears well-developed and well-nourished. No distress.  Afebrile, nontoxic-appearing, lying in bed in discomfort no acute distress  HENT:  Head: Normocephalic and atraumatic.  Eyes: Conjunctivae are normal.  Neck: Normal range of motion. Neck supple.  Cardiovascular: Normal rate, regular rhythm, normal heart sounds and intact distal pulses.   No murmur heard. Pulmonary/Chest: Effort normal and breath sounds normal. No respiratory distress. She has no wheezes. She has no rales.  Abdominal: Soft. She exhibits no distension. There is no tenderness.  Musculoskeletal: Normal range of motion. She exhibits tenderness. She exhibits no edema or deformity.  Tenderness to palpation of the left buttock    Neurological: She is alert. No sensory deficit. She exhibits normal muscle tone.  5 out of 5 strength to flexion, knee flexion and extension, plantar flexion dorsiflexion bilaterally. Neurovascularly intact distally. She was able to ambulate without difficulty. Normal stance and gait.  Skin: Skin is warm and dry. She is not diaphoretic.  Psychiatric: She has a normal mood and affect.  Nursing note and vitals reviewed.    ED Treatments / Results  Labs (all labs ordered are listed, but only abnormal results are displayed) Labs Reviewed - No data to display  EKG  EKG Interpretation None       Radiology Dg Lumbar Spine Complete  Result Date: 08/11/2016 CLINICAL DATA:  Acute onset low back pain, midline and left of midline, x 2 days. Denies any injury. EXAM: LUMBAR SPINE - COMPLETE 4+ VIEW COMPARISON:  DXA study 12/09/2012 FINDINGS: There is facet hypertrophy at L4-5 and L5-S1. No evidence for acute fracture or traumatic subluxation. No suspicious lytic or blastic lesions are identified. Bowel gas pattern is nonobstructed. IMPRESSION: Degenerative changes in the lower lumbar spine. No evidence for acute abnormality. Electronically Signed   By: Nolon Nations M.D.   On: 08/11/2016 18:40   Dg Hip Unilat With Pelvis 2-3 Views Left  Result Date: 08/11/2016 CLINICAL DATA:  Left hip pain beginning yesterday with difficulty ambulating. No known injury. EXAM: DG HIP (WITH OR WITHOUT PELVIS) 2-3V LEFT COMPARISON:  None. FINDINGS: There is no evidence of hip fracture or dislocation. There is no evidence of arthropathy or other focal bone abnormality. IMPRESSION: Negative. Electronically Signed   By: Earle Gell M.D.   On: 08/11/2016 15:36    Procedures Procedures (including critical care time)  Medications Ordered in ED Medications  ketorolac (TORADOL) injection 15 mg (15 mg Intra-articular Given 08/11/16 1710)  fentaNYL (SUBLIMAZE) injection 25 mcg (25 mcg Intravenous Given 08/11/16 1709)  fentaNYL (SUBLIMAZE) injection 50 mcg (50 mcg Intravenous Given 08/11/16 1911)     Initial Impression / Assessment and Plan / ED Course  I have reviewed the triage vital signs and the nursing notes.  Pertinent labs & imaging results that were available during my care of the patient were reviewed by me and considered in my medical decision making (see chart for details).     Patient presents with Symptoms suspicious for sciatica. No gross neurological deficits and normal neuro exam.  Patient has no gait abnormality or concern for cauda equina.  No loss of bowel or bladder control, fever, night sweats, weight loss, h/o malignancy, or IVDU.  RICE protocol and pain medications indicated and discussed with patient.   Patient is ambulatory with normal stance and gait, She favors the left hip due to discomfort. Neurovascularly intact distally No red flag Patient's pain was managed while in the ED.  Discharge home with symptomatic relief and close follow-up with PCP.  She is otherwise well-appearing and stable prior to discharge.   Discussed strict return precautions and advised to return to the emergency department if experiencing any new or worsening symptoms. Instructions were understood and patient agreed with discharge plan. Patient was discussed with Dr. Leonette Monarch who has seen patient and agrees with assessment and plan.  Final Clinical Impressions(s) / ED Diagnoses   Final diagnoses:  Sciatica of left side    New Prescriptions Discharge Medication List as of 08/11/2016  7:13 PM    START taking these medications   Details  cyclobenzaprine (FLEXERIL) 10 MG tablet Take 1 tablet (10 mg total) by mouth at bedtime as needed for muscle spasms., Starting Sun 08/11/2016, Print         Emeline General, PA-C 08/12/16 0127    Fatima Blank, MD 08/16/16 [phone]

## 2016-08-12 NOTE — Assessment & Plan Note (Signed)
Headache: Presents with headache as described above associated with increased nausea from baseline. Neurological exam is nonfocal. Symptoms coincided with initiation of pantoprazole. Side effect?. Need to r/o a IC process , will get a MRI of the brain with and without (d/w rads) Stop pantoprazole, start Prevacid. Get the BMP and sedimentation rate. ER if worse. Addendum: Hyponatremia noted on labs, will repeat labs and order additional tests in few days. MRI was negative.

## 2016-08-13 ENCOUNTER — Other Ambulatory Visit: Payer: Self-pay | Admitting: Internal Medicine

## 2016-08-14 ENCOUNTER — Other Ambulatory Visit: Payer: Self-pay

## 2016-08-14 ENCOUNTER — Other Ambulatory Visit (INDEPENDENT_AMBULATORY_CARE_PROVIDER_SITE_OTHER): Payer: Medicare Other

## 2016-08-14 DIAGNOSIS — E871 Hypo-osmolality and hyponatremia: Secondary | ICD-10-CM

## 2016-08-14 LAB — BASIC METABOLIC PANEL
BUN: 8 mg/dL (ref 6–23)
CO2: 27 mEq/L (ref 19–32)
Calcium: 9.7 mg/dL (ref 8.4–10.5)
Chloride: 92 mEq/L — ABNORMAL LOW (ref 96–112)
Creatinine, Ser: 0.66 mg/dL (ref 0.40–1.20)
GFR: 93.7 mL/min (ref >60.00)
Glucose, Bld: 97 mg/dL (ref 70–99)
Potassium: 4.1 mEq/L (ref 3.5–5.1)
Sodium: 127 mEq/L — ABNORMAL LOW (ref 135–145)

## 2016-08-15 LAB — SODIUM, URINE, RANDOM: Sodium, Ur: 31 mmol/L (ref 28–272)

## 2016-08-15 LAB — OSMOLALITY: Osmolality: 260 mOsm/kg — ABNORMAL LOW (ref 278–305)

## 2016-08-18 ENCOUNTER — Encounter: Payer: Self-pay | Admitting: Internal Medicine

## 2016-08-19 ENCOUNTER — Ambulatory Visit (HOSPITAL_COMMUNITY)
Admission: RE | Admit: 2016-08-19 | Discharge: 2016-08-19 | Disposition: A | Discharge status: Home / Self Care | Payer: Medicare Other | Source: Ambulatory Visit | Attending: Gastroenterology | Admitting: Gastroenterology

## 2016-08-19 ENCOUNTER — Telehealth: Payer: Self-pay | Admitting: Internal Medicine

## 2016-08-19 DIAGNOSIS — K219 Gastro-esophageal reflux disease without esophagitis: Secondary | ICD-10-CM | POA: Diagnosis present

## 2016-08-19 DIAGNOSIS — R112 Nausea with vomiting, unspecified: Secondary | ICD-10-CM

## 2016-08-19 MED ORDER — TECHNETIUM TC 99M SULFUR COLLOID: 2.1000 | Freq: Once | INTRAVENOUS | Status: DC | PRN

## 2016-08-19 NOTE — Telephone Encounter (Signed)
[name]- Pt needs OV (30 minutes) w/ Dr. Larose Kells for early September. Can you contact Pt regarding scheduling appt?

## 2016-08-19 NOTE — Telephone Encounter (Signed)
Please schedule appointment for a follow-up with me for early September. === See patient's message. I spoke with her today, she is concerned about hyponatremia, I let her know that her sodium has been stable and the plan is to recheck when she comes back. She also had headaches but has been asymptomatic for the last few days. Currently having hip pain, saw another provider, to do physical therapy. JP

## 2016-08-20 ENCOUNTER — Ambulatory Visit: Payer: Self-pay | Admitting: Medical

## 2016-08-20 NOTE — Telephone Encounter (Signed)
Pt has been scheduled.  °

## 2016-08-21 ENCOUNTER — Telehealth: Payer: Self-pay | Admitting: *Deleted

## 2016-08-21 NOTE — Telephone Encounter (Signed)
[facility] note received via fax  Call:   Date: 08/20/2016 Time: 8:33pm   Caller: Tobie Perdue Return number: [phone]  Nurse: Marvell Fuller, RN  Chief Complaint: Weakness, Generalized  Reason for call: Caller states she's feeling weak; all encompassing. Ate supper but had to get in the bed, fees like she's too weak to do anything. Caller states sodium levels are low, lab test done a week ago. Advised by MD to restrict free water intake.  Related visit to physician within the last 2 weeks: Yes  Guideline: Weakness (Generalized) and Fatigue. [1] MODERATE weakness (i.e., interferes with work, school, normal activities) AND [2] cause unknown (exceptions: weakness with acute minor illness, or weakness from poor fluid intake)  Disposition: See Physician within 4 Hours (or PCP triage)

## 2016-08-21 NOTE — Telephone Encounter (Signed)
Called patient. She just got up and is 'doing ok' this morning. She is no longer experiencing weakness. She will keep appointment as scheduled for 09/11/16 and agrees to follow-up sooner if weakness returns.

## 2016-08-21 NOTE — Telephone Encounter (Signed)
thx

## 2016-08-23 ENCOUNTER — Encounter: Payer: Self-pay | Admitting: Internal Medicine

## 2016-08-27 ENCOUNTER — Ambulatory Visit: Payer: 59 | Admitting: Psychology

## 2016-09-04 LAB — HM DIABETES FOOT EXAM

## 2016-09-04 LAB — HEMOGLOBIN A1C: Hemoglobin A1C: 6.6

## 2016-09-10 ENCOUNTER — Ambulatory Visit: Payer: Medicare Other | Admitting: Podiatry

## 2016-09-10 ENCOUNTER — Telehealth: Payer: Self-pay | Admitting: *Deleted

## 2016-09-10 ENCOUNTER — Ambulatory Visit: Payer: 59 | Admitting: Psychology

## 2016-09-10 NOTE — Telephone Encounter (Signed)
Received Physician Orders from [facility]; forwarded to provider/SLS 09/04

## 2016-09-11 ENCOUNTER — Ambulatory Visit (INDEPENDENT_AMBULATORY_CARE_PROVIDER_SITE_OTHER): Payer: Medicare Other | Admitting: Internal Medicine

## 2016-09-11 ENCOUNTER — Encounter: Payer: Self-pay | Admitting: Internal Medicine

## 2016-09-11 VITALS — BP 130/78 | HR 62 | Temp 98.1°F | Resp 14 | Ht 65.0 in | Wt 183.2 lb

## 2016-09-11 DIAGNOSIS — R110 Nausea: Secondary | ICD-10-CM | POA: Diagnosis not present

## 2016-09-11 DIAGNOSIS — R519 Headache, unspecified: Secondary | ICD-10-CM

## 2016-09-11 DIAGNOSIS — R51 Headache: Secondary | ICD-10-CM

## 2016-09-11 DIAGNOSIS — E871 Hypo-osmolality and hyponatremia: Secondary | ICD-10-CM | POA: Diagnosis not present

## 2016-09-11 LAB — HEPATIC FUNCTION PANEL
ALT: 16 (ref 7–35)
AST: 14 (ref 13–35)
Alkaline Phosphatase: 48 (ref 25–125)
Bilirubin, Total: 0.5

## 2016-09-11 LAB — BASIC METABOLIC PANEL
BUN: 6 (ref 4–21)
Creatinine: 0.6 (ref 0.5–1.1)
Glucose: 122
Potassium: 4.1 (ref 3.4–5.3)
Sodium: 135 — AB (ref 137–147)

## 2016-09-11 LAB — TSH: TSH: 0.29 — AB (ref 0.41–5.90)

## 2016-09-11 MED ORDER — PANTOPRAZOLE SODIUM 40 MG PO TBEC: 40.0000 mg | DELAYED_RELEASE_TABLET | Freq: Two times a day (BID) | ORAL | 5 refills | Status: DC

## 2016-09-11 NOTE — Progress Notes (Signed)
Pre visit review using our clinic review tool, if applicable. No additional management support is needed unless otherwise documented below in the visit note. 

## 2016-09-11 NOTE — Assessment & Plan Note (Signed)
Hyponatremia:  At the last visit she was found to have hyponatremia, she was euvolemic, had low osmolality, thus DX SIADH, hypothyroidism, pain, adrenal insufficiency, renal failure , meds (trintellix can cause hyponatremia however she has been on that medication for years) etc were in the DDX   I spoke with Dr. Buddy Duty today, her endocrinologist, he did labs today, her sodium was actually 135. TSH was 0.29 slightly low, he is checking also  ACTH, cortisol, TFTs, urinary sodium etc. he will share the results with me, further advice would result noting that today hyponatremia has actually resolved. Headache: going on for ~ 6-7 weeks, unchanged from previous visit, MRI of the brain negative. Reassess in few weeks, also rec to d/w  neurology Also, okay to go back on pantoprazole (see lat OV) Nausea: Going on for several months, already saw GI, gastric emptying study 08/19/2016 negative. Again discuss with endocrinology, consider holding metformin. Dementia: Saw neurology, no new recommendations. They did not address the headache. RTC 6 weeks

## 2016-09-11 NOTE — Patient Instructions (Signed)
Next visit in  4-6 weeks.  If they headache and nausea becomes severe or more intense please let me know

## 2016-09-11 NOTE — Progress Notes (Signed)
Subjective:    Patient ID: [name], female    DOB: 1945-06-12, 71 y.o.   MRN: [id number]  DOS:  09/11/2016 Type of visit - description : f/u Interval history: Here with her daughter [name]. Since the last visit she feels about the same. Continue with headaches Continue with nausea on and off and occasional vomiting.  Workup reviewed with the patient  Review of Systems   Past Medical History:  Diagnosis Date  . Adenomatous colon polyp 03/1988  . Anemia    borderline  . Arthritis    R shoulder - degenerative   . Cataract    beginning stages  . Chest pain     Sept 2011: stress test neg  . Depression    sees Dr.Cotle  . Diabetes mellitus    dr Buddy Duty  . Diverticulosis   . Fatty liver    Increased LFTs, saw GI 06/2011, likely from fatty liver   . GERD (gastroesophageal reflux disease)   . History of hiatal hernia   . Hyperlipemia   . Hypertension   . IBS (irritable bowel syndrome)    and dyspepsia  . Memory impairment 08/2014   mild cognitive impairment vs. mild dementia, reommended reinstating of cholinesterase inhibitor   . Osteopenia    dexa 06/2007 and 10/11 Rx CA vitamin d  . RLS (restless legs syndrome)    h/o- no longer using Requip    Past Surgical History:  Procedure Laterality Date  . BREAST BIOPSY Left 10/11/2015   fibroadenoma w/ calcifications, no evidence of malignancy, recommend mammogram repeat-6 mnths  . POLYPECTOMY    . REVERSE SHOULDER ARTHROPLASTY Right 04/21/2014   Procedure: REVERSE SHOULDER ARTHROPLASTY;  Surgeon: Tania Ade, MD;  Location: [facility];  Service: Orthopedics;  Laterality: Right;  Right reverse total shoulder replacement  . TONSILLECTOMY    . UTERINE FIBROID EMBOLIZATION     90s    Social History   Social History  . Marital status: Widowed    Spouse name: [name]  . Number of children: 3  . Years of education: N/A   Occupational History  . retired, was a Leisure centre manager)     RN   Social History Main Topics    . Smoking status: Former Smoker    Packs/day: 2.00    Years: 10.00    Types: Cigarettes    Quit date: 11/26/1975  . Smokeless tobacco: Never Used     Comment: used to smoke 2 ppd  . Alcohol use 0.0 oz/week     Comment: rarely  . Drug use: No  . Sexual activity: Not Currently   Other Topics Concern  . Not on file   Social History Narrative   Widow , lives by herself, still drives, daughter [name] lives in [place]; another daughter in [place]   5 g-children      Allergies as of 09/11/2016      Reactions   Sulfa Antibiotics Nausea Only   Trulicity [dulaglutide] Nausea Only   Penicillins Rash, Other (See Comments)   Has patient had a PCN reaction causing immediate rash, facial/tongue/throat swelling, SOB or lightheadedness with hypotension: No Has patient had a PCN reaction causing severe rash involving mucus membranes or skin necrosis: No Has patient had a PCN reaction that required hospitalization No Has patient had a PCN reaction occurring within the last 10 years: No If all of the above answers are "NO", then may proceed with Cephalosporin use.      Medication List  Accurate as of 09/11/16  7:10 PM. Always use your most recent med list.          acetaminophen 500 MG tablet Commonly known as:  TYLENOL Take 500-1,000 mg by mouth every 6 (six) hours as needed for mild pain, moderate pain or headache.   ARICEPT 10 MG tablet Generic drug:  donepezil Take 2 tablets (20 mg total) by mouth at bedtime.   aspirin EC 81 MG tablet Take 81 mg by mouth daily.   atenolol 50 MG tablet Commonly known as:  TENORMIN Take 0.5 tablets (25 mg total) by mouth daily.   atorvastatin 40 MG tablet Commonly known as:  LIPITOR Take 1 tablet (40 mg total) by mouth daily.   cyclobenzaprine 10 MG tablet Commonly known as:  FLEXERIL Take 1 tablet (10 mg total) by mouth at bedtime as needed for muscle spasms.   darifenacin 15 MG 24 hr tablet Commonly known as:  ENABLEX Take 1 tablet  (15 mg total) by mouth daily.   Ferrous Fumarate-Vitamin C ER 65-25 MG Tbcr Commonly known as:  FERRO-SEQUELS Take 1 tablet by mouth daily.   LEVEMIR FLEXTOUCH 100 UNIT/ML Pen Generic drug:  Insulin Detemir Inject 64 Units into the skin at bedtime.   losartan 25 MG tablet Commonly known as:  COZAAR Take 1 tablet (25 mg total) by mouth every evening.   memantine 5 MG tablet Commonly known as:  NAMENDA Take 5 mg by mouth 2 (two) times daily.   metFORMIN 1000 MG tablet Commonly known as:  GLUCOPHAGE Take 1 tablet (1,000 mg total) by mouth 2 (two) times daily with a meal.   ondansetron 4 MG tablet Commonly known as:  ZOFRAN TAKE 1 TABLET BY MOUTH EVERY 8 HOURS AS NEEDED FOR NAUSEA AND VOMITING   pantoprazole 40 MG tablet Commonly known as:  PROTONIX Take 1 tablet (40 mg total) by mouth 2 (two) times daily before a meal.   PREVIDENT 5000 DRY MOUTH 1.1 % Gel dental gel Generic drug:  sodium fluoride Place 1 application onto teeth at bedtime.   TRINTELLIX 20 MG Tabs Generic drug:  vortioxetine HBr Take 20 mg by mouth daily.   vitamin B-12 1000 MCG tablet Commonly known as:  CYANOCOBALAMIN Take 1 tablet (1,000 mcg total) by mouth daily.            Discharge Care Instructions        Start     Ordered   09/11/16 0000  pantoprazole (PROTONIX) 40 MG tablet  2 times daily before meals     09/11/16 1353         Objective:   Physical Exam BP 130/78 (BP Location: Left Arm, Patient Position: Sitting, Cuff Size: Small)   Pulse 62   Temp 98.1 F (36.7 C) (Oral)   Resp 14   Ht 5\' 5"  (1.651 m)   Wt 183 lb 4 oz (83.1 kg)   SpO2 96%   BMI 30.49 kg/m  General:   Well developed, well nourished . NAD.  HEENT:  Normocephalic . Face symmetric, atraumatic  Skin: Not pale. Not jaundice Neurologic:  alert & oriented X3.  Speech normal, gait appropriate for age and unassisted Psych--  Cognition and judgment appear intact.  Cooperative with normal attention span and  concentration.  Behavior appropriate. No anxious or depressed appearing.      Assessment & Plan:  Assessment  DM Dr. Buddy Duty HTN Hyperlipidemia Depression Dr. Clovis Pu OSA -- mild, saw Dr Gwenette Greet 2014, no CPAP GI: --IBS, colon  polyps, diverticulosis, hiatal hernia --Chronic constipation likely part of her IBS syndrome  --Fatty liver >>> GI eval 2013 --iron fec anemia:  cscope 04-2014 : 2 polyps; + hemocult @ GI office 10-2014: EGD done (-), bx neg Osteopenia: T score -1.1  2009 , osteopenia again per dexa 05-2013 , rx ca-vit d B12 deficiency  RLS MSK: --DJD frozen shoulder Mild cognitive impairment  MMSE 2015 --> 28, on Aricept, namenda added 04-2016 sees Dr Everette Rank  Dizziness: chronic, carotid US 2016 neg, saw cards-- not likely CV related; saw neuro DR Everette Rank 02-2015  Chest pain 09-2009, negative stress test Thyroid nodules: Incidental   by Carotid US, BX 11-2014: Atypical findings, [facility], f/u  Endocrine  PLAN: Hyponatremia:  At the last visit she was found to have hyponatremia, she was euvolemic, had low osmolality, thus DX SIADH, hypothyroidism, pain, adrenal insufficiency, renal failure , meds (trintellix can cause hyponatremia however she has been on that medication for years) etc were in the DDX   I spoke with Dr. Buddy Duty today, her endocrinologist, he did labs today, her sodium was actually 135. TSH was 0.29 slightly low, he is checking also  ACTH, cortisol, TFTs, urinary sodium etc. he will share the results with me, further advice would result noting that today hyponatremia has actually resolved. Headache: going on for ~ 6-7 weeks, unchanged from previous visit, MRI of the brain negative. Reassess in few weeks, also rec to d/w  neurology Also, okay to go back on pantoprazole (see lat OV) Nausea: Going on for several months, already saw GI, gastric emptying study 08/19/2016 negative. Again discuss with endocrinology, consider holding metformin. Dementia: Saw neurology, no new  recommendations. They did not address the headache. RTC 6 weeks     Today, I spent more than  30 min with the patient: >50% of the time counseling regards to her current symptoms, coordinating her current regular doctors, reviewing recent labs results.

## 2016-09-11 NOTE — Telephone Encounter (Signed)
Orders signed and faxed to [phone]. Form sent for scanning.

## 2016-09-12 ENCOUNTER — Ambulatory Visit: Payer: Medicare Other | Admitting: Podiatry

## 2016-09-16 ENCOUNTER — Other Ambulatory Visit: Payer: Self-pay | Admitting: Internal Medicine

## 2016-09-17 ENCOUNTER — Encounter: Payer: Self-pay | Admitting: Internal Medicine

## 2016-09-17 ENCOUNTER — Ambulatory Visit: Payer: Self-pay | Admitting: Internal Medicine

## 2016-09-19 ENCOUNTER — Ambulatory Visit (INDEPENDENT_AMBULATORY_CARE_PROVIDER_SITE_OTHER): Payer: Medicare Other | Admitting: Podiatry

## 2016-09-19 ENCOUNTER — Encounter: Payer: Self-pay | Admitting: Internal Medicine

## 2016-09-19 ENCOUNTER — Encounter: Payer: Self-pay | Admitting: Podiatry

## 2016-09-19 ENCOUNTER — Telehealth: Payer: Self-pay | Admitting: Internal Medicine

## 2016-09-19 ENCOUNTER — Other Ambulatory Visit: Payer: Self-pay | Admitting: Internal Medicine

## 2016-09-19 DIAGNOSIS — R911 Solitary pulmonary nodule: Secondary | ICD-10-CM

## 2016-09-19 DIAGNOSIS — M79676 Pain in unspecified toe(s): Secondary | ICD-10-CM | POA: Diagnosis not present

## 2016-09-19 DIAGNOSIS — B351 Tinea unguium: Secondary | ICD-10-CM | POA: Diagnosis not present

## 2016-09-19 DIAGNOSIS — E222 Syndrome of inappropriate secretion of antidiuretic hormone: Secondary | ICD-10-CM

## 2016-09-19 DIAGNOSIS — Q828 Other specified congenital malformations of skin: Secondary | ICD-10-CM | POA: Diagnosis not present

## 2016-09-19 NOTE — Telephone Encounter (Signed)
Pt's daughter [name] called in to request a Rx for Aleve and Tylenol for pt. She said that pt have a pinched nerve in her back and need it.   Pharmacy:[facility] [facility] [place] - [place], [place] - [address]

## 2016-09-19 NOTE — Progress Notes (Signed)
She presents today with a chief complaint of diabetes and elongated toenails and calluses. She does have some manifestations of polyneuropathy.  Objective: Pulses are palpable. Loss of digital hair growth polyneuropathy is present with loss of sensation. Toenails are long thick yellow dystrophic with clinically mycotic multiple benign porokeratotic lesions bilateral.  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  Plan: Debridement of toenails 1 through 5 bilateral debridement of all reactive hyperkeratoses plantar and distal aspect bilateral foot.

## 2016-09-20 MED ORDER — ACETAMINOPHEN 500 MG PO TABS: 1000.0000 mg | ORAL_TABLET | Freq: Three times a day (TID) | ORAL | 0 refills | Status: DC | PRN

## 2016-09-20 NOTE — Telephone Encounter (Signed)
Rx sent to [facility].  °

## 2016-09-20 NOTE — Telephone Encounter (Signed)
Please advise 

## 2016-09-20 NOTE — Telephone Encounter (Signed)
-  Recommend Tylenol 500 mg OTC: 2 tablets every 8 hours as needed. -Also, I got the note from endocrinology, blood work results suggest SIADH, they are recommending a CT of the chest to rule out a intrathoracic abnormality causing this condition

## 2016-09-24 ENCOUNTER — Ambulatory Visit: Payer: Self-pay | Admitting: Internal Medicine

## 2016-09-26 ENCOUNTER — Ambulatory Visit
Admission: RE | Admit: 2016-09-26 | Discharge: 2016-09-26 | Disposition: A | Discharge status: Home / Self Care | Payer: Medicare Other | Source: Ambulatory Visit | Attending: Internal Medicine | Admitting: Internal Medicine

## 2016-09-26 ENCOUNTER — Ambulatory Visit: Payer: Medicare Other | Admitting: Podiatry

## 2016-09-26 DIAGNOSIS — R911 Solitary pulmonary nodule: Secondary | ICD-10-CM

## 2016-09-26 DIAGNOSIS — E222 Syndrome of inappropriate secretion of antidiuretic hormone: Secondary | ICD-10-CM

## 2016-09-27 ENCOUNTER — Telehealth: Payer: Self-pay | Admitting: Internal Medicine

## 2016-09-27 NOTE — Telephone Encounter (Signed)
Patient Name: [name] DOB: 07/03/1945 Initial Comment She has an apt next Wednesday for frequent vomiting. She is not sick, today she was eating salad when she vomited. She is calling because it has been more frequent. Nurse Assessment Nurse: Juleen China, RN, Butch Penny Date/Time Eilene Ghazi Time): 09/27/2016 1:17:57 PM Confirm and document reason for call. If symptomatic, describe symptoms. ---She has an appt for 10/3 frequent vomiting. Today she was eating salad when she vomited. She thinks she has hyponatremia. She is calling because it has been more frequent not every day. No fever. Urinated 12 hours. She does feel weak at time. Does not have energy to participate in classes where she lives. Does the patient have any new or worsening symptoms? ---Yes Will a triage be completed? ---Yes Related visit to physician within the last 2 weeks? ---No Does the PT have any chronic conditions? (i.e. diabetes, asthma, etc.) ---No Is this a behavioral health or substance abuse call? ---No Guidelines Guideline Title Affirmed Question Affirmed Notes Vomiting [1] MILD or MODERATE vomiting AND [2] present > 48 hours (2 days) (Exception: mild vomiting with associated diarrhea) Final Disposition User See Physician within [place], RN, Butch Penny Comments Appointment was made for 9/22 at 10:00 am. Caller verbalized understanding. Referrals REFERRED TO PCP OFFICE Caller Disagree/Comply Comply Caller Understands No PreDisposition Call Doctor

## 2016-09-27 NOTE — Telephone Encounter (Signed)
Noted. Patient has an appointment on tomorrow, 09/28/16 at [facility].

## 2016-09-28 ENCOUNTER — Encounter: Payer: Self-pay | Admitting: Family Medicine

## 2016-09-28 ENCOUNTER — Ambulatory Visit (INDEPENDENT_AMBULATORY_CARE_PROVIDER_SITE_OTHER): Payer: Medicare Other | Admitting: Family Medicine

## 2016-09-28 VITALS — BP 122/82 | HR 76 | Temp 98.1°F | Wt 177.0 lb

## 2016-09-28 DIAGNOSIS — R112 Nausea with vomiting, unspecified: Secondary | ICD-10-CM | POA: Diagnosis not present

## 2016-09-28 DIAGNOSIS — K219 Gastro-esophageal reflux disease without esophagitis: Secondary | ICD-10-CM | POA: Diagnosis not present

## 2016-09-28 MED ORDER — ONDANSETRON HCL 4 MG PO TABS: ORAL_TABLET | ORAL | 0 refills | Status: DC

## 2016-09-28 NOTE — Patient Instructions (Signed)
Gastroesophageal Reflux Disease, Adult Normally, food travels down the esophagus and stays in the stomach to be digested. However, when a person has gastroesophageal reflux disease (GERD), food and stomach acid move back up into the esophagus. When this happens, the esophagus becomes sore and inflamed. Over time, GERD can create small holes (ulcers) in the lining of the esophagus. What are the causes? This condition is caused by a problem with the muscle between the esophagus and the stomach (lower esophageal sphincter, or LES). Normally, the LES muscle closes after food passes through the esophagus to the stomach. When the LES is weakened or abnormal, it does not close properly, and that allows food and stomach acid to go back up into the esophagus. The LES can be weakened by certain dietary substances, medicines, and medical conditions, including:  Tobacco use.  Pregnancy.  Having a hiatal hernia.  Heavy alcohol use.  Certain foods and beverages, such as coffee, chocolate, onions, and peppermint.  What increases the risk? This condition is more likely to develop in:  People who have an increased body weight.  People who have connective tissue disorders.  People who use NSAID medicines.  What are the signs or symptoms? Symptoms of this condition include:  Heartburn.  Difficult or painful swallowing.  The feeling of having a lump in the throat.  Abitter taste in the mouth.  Bad breath.  Having a large amount of saliva.  Having an upset or bloated stomach.  Belching.  Chest pain.  Shortness of breath or wheezing.  Ongoing (chronic) cough or a night-time cough.  Wearing away of tooth enamel.  Weight loss.  Different conditions can cause chest pain. Make sure to see your health care provider if you experience chest pain. How is this diagnosed? Your health care provider will take a medical history and perform a physical exam. To determine if you have mild or severe  GERD, your health care provider may also monitor how you respond to treatment. You may also have other tests, including:  An endoscopy toexamine your stomach and esophagus with a small camera.  A test thatmeasures the acidity level in your esophagus.  A test thatmeasures how much pressure is on your esophagus.  A barium swallow or modified barium swallow to show the shape, size, and functioning of your esophagus.  How is this treated? The goal of treatment is to help relieve your symptoms and to prevent complications. Treatment for this condition may vary depending on how severe your symptoms are. Your health care provider may recommend:  Changes to your diet.  Medicine.  Surgery.  Follow these instructions at home: Diet  Follow a diet as recommended by your health care provider. This may involve avoiding foods and drinks such as: ? Coffee and tea (with or without caffeine). ? Drinks that containalcohol. ? Energy drinks and sports drinks. ? Carbonated drinks or sodas. ? Chocolate and cocoa. ? Peppermint and mint flavorings. ? Garlic and onions. ? Horseradish. ? Spicy and acidic foods, including peppers, chili powder, curry powder, vinegar, hot sauces, and barbecue sauce. ? Citrus fruit juices and citrus fruits, such as oranges, lemons, and limes. ? Tomato-based foods, such as red sauce, chili, salsa, and pizza with red sauce. ? Fried and fatty foods, such as donuts, french fries, potato chips, and high-fat dressings. ? High-fat meats, such as hot dogs and fatty cuts of red and white meats, such as rib eye steak, sausage, ham, and bacon. ? High-fat dairy items, such as whole milk,   butter, and cream cheese.  Eat small, frequent meals instead of large meals.  Avoid drinking large amounts of liquid with your meals.  Avoid eating meals during the 2-3 hours before bedtime.  Avoid lying down right after you eat.  Do not exercise right after you eat. General  instructions  Pay attention to any changes in your symptoms.  Take over-the-counter and prescription medicines only as told by your health care provider. Do not take aspirin, ibuprofen, or other NSAIDs unless your health care provider told you to do so.  Do not use any tobacco products, including cigarettes, chewing tobacco, and e-cigarettes. If you need help quitting, ask your health care provider.  Wear loose-fitting clothing. Do not wear anything tight around your waist that causes pressure on your abdomen.  Raise (elevate) the head of your bed 6 inches (15cm).  Try to reduce your stress, such as with yoga or meditation. If you need help reducing stress, ask your health care provider.  If you are overweight, reduce your weight to an amount that is healthy for you. Ask your health care provider for guidance about a safe weight loss goal.  Keep all follow-up visits as told by your health care provider. This is important. Contact a health care provider if:  You have new symptoms.  You have unexplained weight loss.  You have difficulty swallowing, or it hurts to swallow.  You have wheezing or a persistent cough.  Your symptoms do not improve with treatment.  You have a hoarse voice. Get help right away if:  You have pain in your arms, neck, jaw, teeth, or back.  You feel sweaty, dizzy, or light-headed.  You have chest pain or shortness of breath.  You vomit and your vomit looks like blood or coffee grounds.  You faint.  Your stool is bloody or black.  You cannot swallow, drink, or eat. This information is not intended to replace advice given to you by your health care provider. Make sure you discuss any questions you have with your health care provider. Document Released: 10/03/2004 Document Revised: 05/24/2015 Document Reviewed: 04/20/2014 Elsevier Interactive Patient Education  2017 Elsevier Inc.  

## 2016-09-28 NOTE — Progress Notes (Signed)
OFFICE VISIT  09/28/2016   CC:  Chief Complaint  Patient presents with  . Emesis    ongoing x 1 year... more frequent in the past 2 weeks.... recently saw Endo and sodium was normal....   . Medication Refill    Zofran--- because it does help    HPI:    Patient is a 71 y.o.  female who presents accompanied by daughter for ongoing vomiting episodes. Ongoing for over 1 yr, vomiting w/mild nausea associated.  Worse for the last week. Taking aleve/ibup more recently for back pain. Happens about 1 time a day last 1 week, no particular consistent time of day..  This usually happens after eating.   Has occ heartburn/belching.  She does not pay attention to what type of food she eats: daughter describes poor diet. Takes protonix bid at this time.  No worsening with staring any particular med. No abd pain or fevers.  While eating she has no problems, but does get full quicker. Has lost 6 lbs over last couple weeks. Denies melena or hematochezia.  No diarrhea. She does not monitor her glucoses.  Dr. Fuller Plan is her GI.  Gastric emptying scan normal.  EGD normal 08/2015.  Past Medical History:  Diagnosis Date  . Adenomatous colon polyp 03/1988  . Anemia    borderline  . Arthritis    R shoulder - degenerative   . Cataract    beginning stages  . Chest pain     Sept 2011: stress test neg  . Depression    sees Dr.Cotle  . Diabetes mellitus    dr Buddy Duty  . Diverticulosis   . Fatty liver    Increased LFTs, saw GI 06/2011, likely from fatty liver   . GERD (gastroesophageal reflux disease)   . History of hiatal hernia   . Hyperlipemia   . Hypertension   . IBS (irritable bowel syndrome)    and dyspepsia  . Memory impairment 08/2014   mild cognitive impairment vs. mild dementia, reommended reinstating of cholinesterase inhibitor   . Osteopenia    dexa 06/2007 and 10/11 Rx CA vitamin d  . RLS (restless legs syndrome)    h/o- no longer using Requip    Past Surgical History:  Procedure  Laterality Date  . BREAST BIOPSY Left 10/11/2015   fibroadenoma w/ calcifications, no evidence of malignancy, recommend mammogram repeat-6 mnths  . POLYPECTOMY    . REVERSE SHOULDER ARTHROPLASTY Right 04/21/2014   Procedure: REVERSE SHOULDER ARTHROPLASTY;  Surgeon: Tania Ade, MD;  Location: [facility];  Service: Orthopedics;  Laterality: Right;  Right reverse total shoulder replacement  . TONSILLECTOMY    . UTERINE FIBROID EMBOLIZATION     90s    Outpatient Medications Prior to Visit  Medication Sig Dispense Refill  . aspirin EC 81 MG tablet Take 81 mg by mouth daily.     Marland Kitchen atenolol (TENORMIN) 50 MG tablet Take 0.5 tablets (25 mg total) by mouth daily. 45 tablet 1  . atorvastatin (LIPITOR) 40 MG tablet Take 1 tablet (40 mg total) by mouth daily. 90 tablet 1  . cyclobenzaprine (FLEXERIL) 10 MG tablet Take 1 tablet (10 mg total) by mouth at bedtime as needed for muscle spasms. 14 tablet 0  . darifenacin (ENABLEX) 15 MG 24 hr tablet Take 1 tablet (15 mg total) by mouth daily. 30 tablet 6  . donepezil (ARICEPT) 10 MG tablet Take 2 tablets (20 mg total) by mouth at bedtime.    . Ferrous Fumarate-Vitamin C ER (FERRO-SEQUELS) 65-25  MG TBCR Take 1 tablet by mouth daily. 90 tablet 1  . Insulin Detemir (LEVEMIR FLEXTOUCH) 100 UNIT/ML Pen Inject 64 Units into the skin at bedtime.     Marland Kitchen losartan (COZAAR) 25 MG tablet Take 1 tablet (25 mg total) by mouth every evening. 90 tablet 1  . memantine (NAMENDA) 5 MG tablet Take 5 mg by mouth 2 (two) times daily.    . metFORMIN (GLUCOPHAGE) 1000 MG tablet Take 1 tablet (1,000 mg total) by mouth 2 (two) times daily with a meal. 180 tablet 3  . pantoprazole (PROTONIX) 40 MG tablet Take 1 tablet (40 mg total) by mouth 2 (two) times daily before a meal. 60 tablet 5  . sodium fluoride (PREVIDENT 5000 DRY MOUTH) 1.1 % GEL dental gel Place 1 application onto teeth at bedtime.    . vitamin B-12 (CYANOCOBALAMIN) 1000 MCG tablet Take 1 tablet (1,000 mcg total) by mouth  daily. 90 tablet 3  . vortioxetine HBr (TRINTELLIX) 20 MG TABS Take 20 mg by mouth daily.     Marland Kitchen acetaminophen (TYLENOL) 500 MG tablet Take 2 tablets (1,000 mg total) by mouth every 8 (eight) hours as needed for moderate pain. 60 tablet 0  . ondansetron (ZOFRAN) 4 MG tablet TAKE 1 TABLET BY MOUTH EVERY 8 HOURS AS NEEDED FOR NAUSEA AND VOMITING 30 tablet 0   Facility-Administered Medications Prior to Visit  Medication Dose Route Frequency Provider Last Rate Last Dose  . 0.9 %  sodium chloride infusion  500 mL Intravenous Continuous Ladene Artist, MD        Allergies  Allergen Reactions  . Sulfa Antibiotics Nausea Only  . Trulicity [Dulaglutide] Nausea Only  . Penicillins Rash and Other (See Comments)    Has patient had a PCN reaction causing immediate rash, facial/tongue/throat swelling, SOB or lightheadedness with hypotension: No Has patient had a PCN reaction causing severe rash involving mucus membranes or skin necrosis: No Has patient had a PCN reaction that required hospitalization No Has patient had a PCN reaction occurring within the last 10 years: No If all of the above answers are "NO", then may proceed with Cephalosporin use.    ROS As per HPI  PE: Blood pressure 122/82, pulse 76, temperature 98.1 F (36.7 C), temperature source Oral, weight 177 lb (80.3 kg), SpO2 97 %. Gen: Alert, well appearing.  Patient is oriented to person, place, time, and situation. CV: RRR, no m/r/g.   LUNGS: CTA bilat, nonlabored resps, good aeration in all lung fields. ABD: soft, NT, ND, BS normal.  No hepatospenomegaly or mass.  No bruits.   LABS:    Chemistry      Component Value Date/Time   NA 135 (A) 09/11/2016   K 4.1 09/11/2016   CL 92 (L) 08/14/2016 1106   CO2 27 08/14/2016 1106   BUN 6 09/11/2016   CREATININE 0.6 09/11/2016   CREATININE 0.66 08/14/2016 1106   CREATININE 0.64 06/24/2016 0921   GLU 122 09/11/2016      Component Value Date/Time   CALCIUM 9.7 08/14/2016 1106    ALKPHOS 48 09/11/2016   AST 14 09/11/2016   ALT 16 09/11/2016   BILITOT 1.0 06/24/2016 0921     Lab Results  Component Value Date   WBC 6.5 06/24/2016   HGB 12.4 06/24/2016   HCT 37.4 06/24/2016   MCV 83.3 06/24/2016   PLT 212 06/24/2016   Lab Results  Component Value Date   HGBA1C 6.6 09/04/2016    IMPRESSION AND PLAN:  Severe GERD flare: Continue protonix bid. Restart zofran 4mg  tid prn--eRx sent. Discussed diet/behavioral changes to help---gave printout of this advice/info.  An After Visit Summary was printed and given to the patient.  Spent 25 min with pt today, with >50% of this time spent in counseling and care coordination regarding the above problems.  FOLLOW UP: Return if symptoms worsen or fail to improve.  Signed:  Crissie Sickles, MD           09/28/2016

## 2016-10-09 ENCOUNTER — Ambulatory Visit: Payer: Self-pay | Admitting: Internal Medicine

## 2016-10-09 ENCOUNTER — Ambulatory Visit (INDEPENDENT_AMBULATORY_CARE_PROVIDER_SITE_OTHER): Payer: Medicare Other | Admitting: Internal Medicine

## 2016-10-09 ENCOUNTER — Encounter: Payer: Self-pay | Admitting: Internal Medicine

## 2016-10-09 VITALS — BP 122/62 | HR 65 | Temp 98.0°F | Resp 14 | Ht 65.0 in | Wt 181.0 lb

## 2016-10-09 DIAGNOSIS — R112 Nausea with vomiting, unspecified: Secondary | ICD-10-CM

## 2016-10-09 DIAGNOSIS — E222 Syndrome of inappropriate secretion of antidiuretic hormone: Secondary | ICD-10-CM | POA: Diagnosis not present

## 2016-10-09 DIAGNOSIS — Z23 Encounter for immunization: Secondary | ICD-10-CM

## 2016-10-09 NOTE — Patient Instructions (Addendum)
  GO TO THE FRONT DESK Schedule your next appointment for a  checkup in 3 months  For pain: Okay to take ibuprofen OTC 200 mg: One or 2 tablets a day, no more than 4 times a week with food. Okay to take Ultram or Tylenol No. 3 sporadically You can also take 500 mg one or 2 tablets daily  Please get the appointment with Dr. Everette Rank 12/12/2016, discuss with him your headaches  According to the records from endocrinology they like to check your blood 10/18/2016, please confirm that with endocrinology

## 2016-10-09 NOTE — Progress Notes (Signed)
Subjective:    Patient ID: [name], female    DOB: 06-Sep-1945, 71 y.o.   MRN: [id number]  DOS:  10/09/2016 Type of visit - description : F/U, Here with her daughter [name] Interval history: Headaches: Less frequent but still intense at times. 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 Nausea: Still on and off, on Zofran as needed Daughter is concerned about the patient taking frequently ibuprofen OTC for headache some back ache.   Review of Systems   Past Medical History:  Diagnosis Date  . Adenomatous colon polyp 03/1988  . Anemia    borderline  . Arthritis    R shoulder - degenerative   . Cataract    beginning stages  . Chest pain     Sept 2011: stress test neg  . Depression    sees Dr.Cotle  . Diabetes mellitus    dr Buddy Duty  . Diverticulosis   . Fatty liver    Increased LFTs, saw GI 06/2011, likely from fatty liver   . GERD (gastroesophageal reflux disease)   . History of hiatal hernia   . Hyperlipemia   . Hypertension   . IBS (irritable bowel syndrome)    and dyspepsia  . Memory impairment 08/2014   mild cognitive impairment vs. mild dementia, reommended reinstating of cholinesterase inhibitor   . Osteopenia    dexa 06/2007 and 10/11 Rx CA vitamin d  . RLS (restless legs syndrome)    h/o- no longer using Requip    Past Surgical History:  Procedure Laterality Date  . BREAST BIOPSY Left 10/11/2015   fibroadenoma w/ calcifications, no evidence of malignancy, recommend mammogram repeat-6 mnths  . POLYPECTOMY    . REVERSE SHOULDER ARTHROPLASTY Right 04/21/2014   Procedure: REVERSE SHOULDER ARTHROPLASTY;  Surgeon: Tania Ade, MD;  Location: [facility];  Service: Orthopedics;  Laterality: Right;  Right reverse total shoulder replacement  . TONSILLECTOMY    . UTERINE FIBROID EMBOLIZATION     90s    Social History   Social History  . Marital status: Widowed    Spouse name: [name]  . Number of  children: 3  . Years of education: N/A   Occupational History  . 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 Smoking status: Former Smoker    Packs/day: 2.00    Years: 10.00    Types: Cigarettes    Quit date: 11/26/1975  . Smokeless tobacco: Never Used     Comment: used to smoke 2 ppd  . Alcohol use 0.0 oz/week     Comment: rarely  . Drug use: No  . Sexual activity: Not Currently   Other Topics Concern  . Not on file   Social History Narrative   Widow , lives by herself, still drives, daughter [name] lives in [place]; another daughter in [place]   5 g-children      Allergies as of 10/09/2016      Reactions   Sulfa Antibiotics Nausea Only   Trulicity [dulaglutide] Nausea Only   Penicillins Rash, Other (See Comments)   Has patient had a PCN reaction causing immediate rash, facial/tongue/throat swelling, SOB or lightheadedness with hypotension: No Has patient had a PCN reaction causing severe rash involving mucus membranes or skin necrosis: No Has patient had a PCN reaction that required hospitalization No Has patient had a PCN reaction occurring within the last 10 years: No If all of the above  answers are "NO", then may proceed with Cephalosporin use.      Medication List       Accurate as of 10/09/16 11:59 PM. Always use your most recent med list.          acetaminophen-codeine 300-30 MG tablet Commonly known as:  TYLENOL #3 TAKE 1 TABLET BY MOUTH EVERY 6 TO 8 HOURS as needed for pain   ARICEPT 10 MG tablet Generic drug:  donepezil Take 2 tablets (20 mg total) by mouth at bedtime.   aspirin EC 81 MG tablet Take 81 mg by mouth daily.   atenolol 50 MG tablet Commonly known as:  TENORMIN Take 0.5 tablets (25 mg total) by mouth daily.   atorvastatin 40 MG tablet Commonly known as:  LIPITOR Take 1 tablet (40 mg total) by mouth daily.   cyclobenzaprine 10 MG tablet Commonly known as:  FLEXERIL Take 1 tablet (10 mg total) by  mouth at bedtime as needed for muscle spasms.   darifenacin 15 MG 24 hr tablet Commonly known as:  ENABLEX Take 1 tablet (15 mg total) by mouth daily.   Ferrous Fumarate-Vitamin C ER 65-25 MG Tbcr Commonly known as:  FERRO-SEQUELS Take 1 tablet by mouth daily.   LEVEMIR FLEXTOUCH 100 UNIT/ML Pen Generic drug:  Insulin Detemir Inject 64 Units into the skin at bedtime.   losartan 25 MG tablet Commonly known as:  COZAAR Take 1 tablet (25 mg total) by mouth every evening.   memantine 5 MG tablet Commonly known as:  NAMENDA Take 5 mg by mouth 2 (two) times daily.   metFORMIN 1000 MG tablet Commonly known as:  GLUCOPHAGE Take 1 tablet (1,000 mg total) by mouth 2 (two) times daily with a meal.   ondansetron 4 MG tablet Commonly known as:  ZOFRAN TAKE 1 TABLET BY MOUTH EVERY 8 HOURS AS NEEDED FOR NAUSEA AND VOMITING   pantoprazole 40 MG tablet Commonly known as:  PROTONIX Take 1 tablet (40 mg total) by mouth 2 (two) times daily before a meal.   PREVIDENT 5000 DRY MOUTH 1.1 % Gel dental gel Generic drug:  sodium fluoride Place 1 application onto teeth at bedtime.   traMADol 50 MG tablet Commonly known as:  ULTRAM TAKE 1 TABLET BY MOUTH EVERY 6 TO 8 HOURS as needed for pain   TRINTELLIX 20 MG Tabs Generic drug:  vortioxetine HBr Take 20 mg by mouth daily.   vitamin B-12 1000 MCG tablet Commonly known as:  CYANOCOBALAMIN Take 1 tablet (1,000 mcg total) by mouth daily.          Objective:   Physical Exam BP 122/62 (BP Location: Left Arm, Patient Position: Sitting, Cuff Size: Small)   Pulse 65   Temp 98 F (36.7 C) (Oral)   Resp 14   Ht 5\' 5"  (1.651 m)   Wt 181 lb (82.1 kg)   SpO2 97%   BMI 30.12 kg/m  General:   Well developed, well nourished. Overall she looks better compared to previous visits, no distress HEENT:  Normocephalic . Face symmetric, atraumatic Lungs:  CTA B Normal respiratory effort, no intercostal retractions, no accessory muscle  use. Heart: RRR,  no murmur.  No pretibial edema bilaterally  Skin: Not pale. Not jaundice Neurologic:  alert & oriented X3.  Speech normal, gait appropriate for age and unassisted Psych--  Cognition and judgment appear intact.  Cooperative with normal attention span and concentration.  Behavior appropriate. No anxious or depressed appearing.      Assessment &  Plan:   Assessment  DM Dr. Buddy Duty HTN Hyperlipidemia Depression Dr. Clovis Pu OSA -- mild, saw Dr Gwenette Greet 2014, no CPAP GI: --IBS, colon polyps, diverticulosis, hiatal hernia --Chronic constipation likely part of her IBS syndrome  --Fatty liver >>> GI eval 2013 --iron fec anemia:  cscope 04-2014 : 2 polyps; + hemocult @ GI office 10-2014: EGD done (-), bx neg Osteopenia: T score -1.1  2009 , osteopenia again per dexa 05-2013 , rx ca-vit d B12 deficiency  RLS MSK: --DJD frozen shoulder Mild cognitive impairment  MMSE 2015 --> 28, on Aricept, namenda added 04-2016 sees Dr Everette Rank  Dizziness: chronic, carotid US 2016 neg, saw cards-- not likely CV related; saw neuro DR Everette Rank 02-2015  Chest pain 09-2009, negative stress test Thyroid nodules: Incidental   by Carotid US, BX 11-2014: Atypical findings, [facility], f/u  Endocrine RLL Lung nodule: Stable per CT 09-2016, no need for further  PLAN: 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 According to the notes, they are planning to check her labs 10/18/2016, if they not she will let me know and I'll check a BMP here. Headache: Still has headaches, less frequent but still intense at times. She will see Dr. Everette Rank in  few weeks, recommend to discuss the issue with them. Nausea: Still an issue, already eval by GI, w/u (-), does have a HA but brain MRI was (-). She is currently taking Protonix and Zofran as needed. Pain management: Patient's daughter is concerned about daily use of NSAIDs, I agree. Recommend to minimize its use to 1 dose 3 or  4 times a week. She can also use prn Ultram, Tylenol No. 3 and Tylenol. See instructions. Although she continue with HAs/nausea, she feels and looks overal better. RTC 3 months

## 2016-10-09 NOTE — Progress Notes (Signed)
Pre visit review using our clinic review tool, if applicable. No additional management support is needed unless otherwise documented below in the visit note. 

## 2016-10-10 DIAGNOSIS — E222 Syndrome of inappropriate secretion of antidiuretic hormone: Secondary | ICD-10-CM | POA: Insufficient documentation

## 2016-10-10 NOTE — Assessment & Plan Note (Signed)
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 According to the notes, they are planning to check her labs 10/18/2016, if they not she will let me know and I'll check a BMP here. Headache: Still has headaches, less frequent but still intense at times. She will see Dr. Everette Rank in  few weeks, recommend to discuss the issue with them. Nausea: Still an issue, already eval by GI, w/u (-), does have a HA but brain MRI was (-). She is currently taking Protonix and Zofran as needed. Pain management: Patient's daughter is concerned about daily use of NSAIDs, I agree. Recommend to minimize its use to 1 dose 3 or 4 times a week. She can also use prn Ultram, Tylenol No. 3 and Tylenol. See instructions. Although she continue with HAs/nausea, she feels and looks overal better. RTC 3 months

## 2016-10-31 LAB — HM MAMMOGRAPHY

## 2016-11-04 ENCOUNTER — Encounter: Payer: Self-pay | Admitting: Internal Medicine

## 2016-12-05 ENCOUNTER — Ambulatory Visit (INDEPENDENT_AMBULATORY_CARE_PROVIDER_SITE_OTHER): Payer: Medicare Other | Admitting: Family Medicine

## 2016-12-05 ENCOUNTER — Encounter: Payer: Self-pay | Admitting: Family Medicine

## 2016-12-05 VITALS — BP 142/78 | HR 75 | Temp 98.0°F | Ht 65.0 in | Wt 176.2 lb

## 2016-12-05 DIAGNOSIS — R5383 Other fatigue: Secondary | ICD-10-CM | POA: Diagnosis not present

## 2016-12-05 LAB — IBC PANEL
Iron: 61 ug/dL (ref 42–145)
Saturation Ratios: 16.1 % — ABNORMAL LOW (ref 20.0–50.0)
Transferrin: 271 mg/dL (ref 212.0–360.0)

## 2016-12-05 LAB — CBC
HCT: 38.6 % (ref 36.0–46.0)
Hemoglobin: 12.8 g/dL (ref 12.0–15.0)
MCHC: 33.2 g/dL (ref 30.0–36.0)
MCV: 88.4 fl (ref 78.0–100.0)
Platelets: 234 10*3/uL (ref 150.0–400.0)
RBC: 4.36 Mil/uL (ref 3.87–5.11)
RDW: 13.8 % (ref 11.5–15.5)
WBC: 12.2 10*3/uL — ABNORMAL HIGH (ref 4.0–10.5)

## 2016-12-05 LAB — FERRITIN: Ferritin: 49.7 ng/mL (ref 10.0–291.0)

## 2016-12-05 MED ORDER — CENTRUM SILVER 50+WOMEN PO TABS: 1.0000 | ORAL_TABLET | Freq: Every day | ORAL | 11 refills | Status: DC

## 2016-12-05 NOTE — Patient Instructions (Addendum)
Consider drinking Ensure or a protein bar rather than cola/Cheetos for snacks. Frequent and small meals may also be more beneficial for your energy levels.   Use non-scented lotions twice daily on your legs.

## 2016-12-05 NOTE — Progress Notes (Signed)
Chief Complaint  Patient presents with  . Fatigue    right shoulder problem    Subjective: Patient is a 71 y.o. female here for fatigue. Her daughter [name] was on the phone and helps provide the hx.   Since yesterday, pt complained of fatigue. She has not been eating normally per the daughter. The pt has dementia and her memory isn't the best, so she does not remember how long this has been going on. Her diet consists of a single meal with protein/veggies and cola+cheetos throughout the rest of the day. She had a metabolic panel and thyroid checked by endo earlier this month. Denies fevers, recent illness, sob/cough, n/v, swelling, areas of easy bruising/bleeding. She does have a hx of Fe deficiency. Sleep is otherwise normal.    ROS: Heme- no bleeding Const- no fevers  Family History  Problem Relation Age of Onset  . Lung cancer Mother        smoker  . Colon cancer Father        F dx in his 46s  . Ovarian cancer Paternal Aunt        ?  . Diabetes Other        aunts-uncles   . Heart attack Other        GM in her 65s  . Stroke Other        aunts-uncles   . Breast cancer Neg Hx   . Rectal cancer Neg Hx   . Stomach cancer Neg Hx   . Esophageal cancer Neg Hx    Past Medical History:  Diagnosis Date  . Adenomatous colon polyp 03/1988  . Anemia    borderline  . Arthritis    R shoulder - degenerative   . Cataract    beginning stages  . Chest pain     Sept 2011: stress test neg  . Depression    sees Dr.Cotle  . Diabetes mellitus    dr Buddy Duty  . Diverticulosis   . Fatty liver    Increased LFTs, saw GI 06/2011, likely from fatty liver   . GERD (gastroesophageal reflux disease)   . History of hiatal hernia   . Hyperlipemia   . Hypertension   . IBS (irritable bowel syndrome)    and dyspepsia  . Memory impairment 08/2014   mild cognitive impairment vs. mild dementia, reommended reinstating of cholinesterase inhibitor   . Osteopenia    dexa 06/2007 and 10/11 Rx CA vitamin  d  . RLS (restless legs syndrome)    h/o- no longer using Requip   Allergies  Allergen Reactions  . Sulfa Antibiotics Nausea Only  . Trulicity [Dulaglutide] Nausea Only  . Penicillins Rash and Other (See Comments)    Has patient had a PCN reaction causing immediate rash, facial/tongue/throat swelling, SOB or lightheadedness with hypotension: No Has patient had a PCN reaction causing severe rash involving mucus membranes or skin necrosis: No Has patient had a PCN reaction that required hospitalization No Has patient had a PCN reaction occurring within the last 10 years: No If all of the above answers are "NO", then may proceed with Cephalosporin use.    Current Outpatient Medications:  .  acetaminophen-codeine (TYLENOL #3) 300-30 MG tablet, TAKE 1 TABLET BY MOUTH EVERY 6 TO 8 HOURS as needed for pain, Disp: , Rfl: 0 .  aspirin EC 81 MG tablet, Take 81 mg by mouth daily. , Disp: , Rfl:  .  atenolol (TENORMIN) 50 MG tablet, Take 0.5 tablets (25 mg total)  by mouth daily., Disp: 45 tablet, Rfl: 1 .  atorvastatin (LIPITOR) 40 MG tablet, Take 1 tablet (40 mg total) by mouth daily., Disp: 90 tablet, Rfl: 1 .  cyclobenzaprine (FLEXERIL) 10 MG tablet, Take 1 tablet (10 mg total) by mouth at bedtime as needed for muscle spasms., Disp: 14 tablet, Rfl: 0 .  darifenacin (ENABLEX) 15 MG 24 hr tablet, Take 1 tablet (15 mg total) by mouth daily., Disp: 30 tablet, Rfl: 6 .  donepezil (ARICEPT) 10 MG tablet, Take 2 tablets (20 mg total) by mouth at bedtime., Disp: , Rfl:  .  Ferrous Fumarate-Vitamin C ER (FERRO-SEQUELS) 65-25 MG TBCR, Take 1 tablet by mouth daily., Disp: 90 tablet, Rfl: 1 .  Insulin Detemir (LEVEMIR FLEXTOUCH) 100 UNIT/ML Pen, Inject 64 Units into the skin at bedtime. , Disp: , Rfl:  .  losartan (COZAAR) 25 MG tablet, Take 1 tablet (25 mg total) by mouth every evening., Disp: 90 tablet, Rfl: 1 .  memantine (NAMENDA) 5 MG tablet, Take 5 mg by mouth 2 (two) times daily., Disp: , Rfl:  .   ondansetron (ZOFRAN) 4 MG tablet, TAKE 1 TABLET BY MOUTH EVERY 8 HOURS AS NEEDED FOR NAUSEA AND VOMITING, Disp: 30 tablet, Rfl: 0 .  pantoprazole (PROTONIX) 40 MG tablet, Take 1 tablet (40 mg total) by mouth 2 (two) times daily before a meal., Disp: 60 tablet, Rfl: 5 .  sodium fluoride (PREVIDENT 5000 DRY MOUTH) 1.1 % GEL dental gel, Place 1 application onto teeth at bedtime., Disp: , Rfl:  .  traMADol (ULTRAM) 50 MG tablet, TAKE 1 TABLET BY MOUTH EVERY 6 TO 8 HOURS as needed for pain, Disp: , Rfl: 0 .  vitamin B-12 (CYANOCOBALAMIN) 1000 MCG tablet, Take 1 tablet (1,000 mcg total) by mouth daily., Disp: 90 tablet, Rfl: 3 .  vortioxetine HBr (TRINTELLIX) 20 MG TABS, Take 20 mg by mouth daily. , Disp: , Rfl:  .  Multiple Vitamins-Minerals (CENTRUM SILVER 50+WOMEN) TABS, Take 1 tablet by mouth daily., Disp: 30 tablet, Rfl: 11  Current Facility-Administered Medications:  .  0.9 %  sodium chloride infusion, 500 mL, Intravenous, Continuous, Ladene Artist, MD  Objective: BP (!) 142/78 (BP Location: Left Arm, Patient Position: Sitting, Cuff Size: Normal)   Pulse 75   Temp 98 F (36.7 C) (Oral)   Ht 5\' 5"  (1.651 m)   Wt 176 lb 4 oz (79.9 kg)   SpO2 98%   BMI 29.33 kg/m  General: Awake, appears stated age HEENT: MMM, EOMi Heart: RRR, no bruits, no LE edema Lungs: CTAB, no rales, wheezes or rhonchi. No accessory muscle use Abd: BS+, soft, NT, ND, no masses or organomegaly Neuro: Follows commands, cooperative, DTRs equal and symmetric in UE's and LE's b/l, no clonus, no cerebellar signs Psych: Limited judgment and insight, normal affect and mood  Assessment and Plan: Fatigue, unspecified type - Plan: CBC, Ferritin, IBC panel  Orders as above. Encouraged her to increase nutritious food intake and less cheetos/cola. Maintain adequate water intake. Multivit suggested given her poor diet.  Very grateful for [name], her daughter, who helped greatly with the interview/hx.  F/u prn.  The patient  and her daughter voiced understanding and agreement to the plan.  [place], DO 12/05/16  12:12 PM

## 2016-12-05 NOTE — Progress Notes (Signed)
Pre visit review using our clinic review tool, if applicable. No additional management support is needed unless otherwise documented below in the visit note. 

## 2016-12-06 ENCOUNTER — Ambulatory Visit: Payer: Self-pay | Admitting: Internal Medicine

## 2016-12-19 ENCOUNTER — Ambulatory Visit: Payer: Medicare Other | Admitting: Podiatry

## 2016-12-27 ENCOUNTER — Telehealth: Payer: Self-pay | Admitting: Internal Medicine

## 2016-12-27 NOTE — Telephone Encounter (Signed)
Rx for NORCO/VICODIN dated 03/05/16 was not picked up. Document was shredded

## 2017-01-02 ENCOUNTER — Other Ambulatory Visit: Payer: Self-pay | Admitting: Internal Medicine

## 2017-01-02 MED ORDER — ATORVASTATIN CALCIUM 40 MG PO TABS: 40.0000 mg | ORAL_TABLET | Freq: Every day | ORAL | 0 refills | Status: DC

## 2017-01-02 NOTE — Telephone Encounter (Signed)
Rx sent to [facility].

## 2017-01-02 NOTE — Telephone Encounter (Signed)
Copied from [facility] [phone]. Topic: Quick Communication - See Telephone Encounter >> Jan 02, 2017  1:16 PM Ether Griffins B wrote: CRM for notification. See Telephone encounter for:  Pts pharmacy faxed over a request to refill meds (atorvastatin) on 12/26. Pt is out of town and forgot medication and has been out of it since Saturday. They would like to know if 4 pills can be called in to [facility] in [place], [place] phone number [phone]  01/02/17.

## 2017-01-08 NOTE — Progress Notes (Addendum)
Subjective:   [name] is a 72 y.o. female who presents for Medicare Annual (Subsequent) preventive examination.  Pt is retired Therapist, sports.  Review of Systems:  No ROS.  Medicare Wellness Visit. Additional risk factors are reflected in the social history. Cardiac Risk Factors include: advanced age (>64men, >40 women);hypertension;diabetes mellitus;sedentary lifestyle   Sleep patterns: Sleeps about 8 hrs per night.  Home Safety/Smoke Alarms: Feels safe in home. Smoke alarms in place. Lives at [facility] in apt. Uses elevator.Has emergency call light in bathroom and grab bars.   Female:   Pap- pt states she follows with Dr.Fogleman      Mammo-  last 10/31/16: BI-RADS Category 2-benign.          Dexa scan- last 10/05/15: osteopenia        CCS - last 04/15/14; Recall 5 yrs.     Objective:     Vitals: BP 136/68 (BP Location: Left Arm, Patient Position: Sitting, Cuff Size: Normal)   Pulse 67   Ht 5\' 5"  (1.651 m)   Wt 171 lb (77.6 kg)   SpO2 98%   BMI 28.46 kg/m   Body mass index is 28.46 kg/m.  Advanced Directives 01/13/2017 08/11/2016 02/29/2016 01/12/2016 09/19/2014 04/11/2014  Does Patient Have a Medical Advance Directive? Yes No Yes Yes No Yes  Type of Academic librarian - Living will [place];Living will - [place];Living will  Does patient want to make changes to medical advance directive? No - Patient declined - - No - Patient declined - -  Copy of [place] in Chart? Yes - - No - copy requested - -  Would patient like information on creating a medical advance directive? - - No - Patient declined - - -    Tobacco Social History   Tobacco Use  Smoking Status Former Smoker  . Packs/day: 2.00  . Years: 10.00  . Pack years: 20.00  . Types: Cigarettes  . Last attempt to quit: 11/26/1975  . Years since quitting: 41.1  Smokeless Tobacco Never Used  Tobacco Comment   used to smoke 2 ppd       Counseling given: Not Answered Comment: used to smoke 2 ppd   Clinical Intake: Pain : No/denies pain  Past Medical History:  Diagnosis Date  . Adenomatous colon polyp 03/1988  . Anemia    borderline  . Arthritis    R shoulder - degenerative   . Cataract    beginning stages  . Chest pain     Sept 2011: stress test neg  . Depression    sees Dr.Cotle  . Diabetes mellitus    dr Buddy Duty  . Diverticulosis   . Fatty liver    Increased LFTs, saw GI 06/2011, likely from fatty liver   . GERD (gastroesophageal reflux disease)   . History of hiatal hernia   . Hyperlipemia   . Hypertension   . IBS (irritable bowel syndrome)    and dyspepsia  . Memory impairment 08/2014   mild cognitive impairment vs. mild dementia, reommended reinstating of cholinesterase inhibitor   . Osteopenia    dexa 06/2007 and 10/11 Rx CA vitamin d  . RLS (restless legs syndrome)    h/o- no longer using Requip   Past Surgical History:  Procedure Laterality Date  . BREAST BIOPSY Left 10/11/2015   fibroadenoma w/ calcifications, no evidence of malignancy, recommend mammogram repeat-6 mnths  . POLYPECTOMY    . REVERSE  SHOULDER ARTHROPLASTY Right 04/21/2014   Procedure: REVERSE SHOULDER ARTHROPLASTY;  Surgeon: Tania Ade, MD;  Location: [facility];  Service: Orthopedics;  Laterality: Right;  Right reverse total shoulder replacement  . TONSILLECTOMY    . UTERINE FIBROID EMBOLIZATION     90s   Family History  Problem Relation Age of Onset  . Lung cancer Mother        smoker  . Colon cancer Father        F dx in his 37s  . Ovarian cancer Paternal Aunt        ?  . Diabetes Other        aunts-uncles   . Heart attack Other        GM in her 50s  . Stroke Other        aunts-uncles   . Breast cancer Neg Hx   . Rectal cancer Neg Hx   . Stomach cancer Neg Hx   . Esophageal cancer Neg Hx    Social History   Socioeconomic History  . Marital status: Widowed    Spouse name: [name]  . Number of children: 3   . Years of education: None  . Highest education level: None  Social Needs  . Financial resource strain: None  . Food insecurity - worry: None  . Food insecurity - inability: None  . Transportation needs - medical: None  . Transportation needs - non-medical: None  Occupational History  . Occupation: retired, was a Leisure centre manager)    Comment: RN  Tobacco Use  . Smoking status: Former Smoker    Packs/day: 2.00    Years: 10.00    Pack years: 20.00    Types: Cigarettes    Last attempt to quit: 11/26/1975    Years since quitting: 41.1  . Smokeless tobacco: Never Used  . Tobacco comment: used to smoke 2 ppd  Substance and Sexual Activity  . Alcohol use: Yes    Alcohol/week: 0.0 oz    Comment: rarely  . Drug use: No  . Sexual activity: Not Currently  Other Topics Concern  . None  Social History Narrative   Widow , lives by herself, still drives, daughter [name] lives in [place]; another daughter in [place]   5 g-children    Outpatient Encounter Medications as of 01/13/2017  Medication Sig  . aspirin (ASPIR-LOW) 81 MG EC tablet Take 1 tablet (81 mg total) by mouth daily.  Marland Kitchen atenolol (TENORMIN) 50 MG tablet Take 0.5 tablets (25 mg total) by mouth daily.  Marland Kitchen atorvastatin (LIPITOR) 40 MG tablet Take 1 tablet (40 mg total) by mouth daily.  Marland Kitchen darifenacin (ENABLEX) 15 MG 24 hr tablet Take 1 tablet (15 mg total) by mouth daily.  Marland Kitchen donepezil (ARICEPT) 10 MG tablet Take 2 tablets (20 mg total) by mouth at bedtime.  . Ferrous Fumarate-Vitamin C ER (FERRO-SEQUELS) 65-25 MG TBCR Take 1 tablet by mouth daily.  . Insulin Detemir (LEVEMIR FLEXTOUCH) 100 UNIT/ML Pen Inject 52 Units into the skin at bedtime.   Marland Kitchen losartan (COZAAR) 25 MG tablet Take 1 tablet (25 mg total) by mouth every evening.  . memantine (NAMENDA) 5 MG tablet Take 5 mg by mouth 2 (two) times daily.  . Multiple Vitamins-Minerals (CENTRUM SILVER 50+WOMEN) TABS Take 1 tablet by mouth daily.  . pantoprazole (PROTONIX) 40 MG  tablet TAKE 1 TABLET BY MOUTH 2 TIMES DAILY BEFORE A MEAL  . vitamin B-12 (CYANOCOBALAMIN) 1000 MCG tablet Take 1 tablet (1,000 mcg total) by mouth daily.  Marland Kitchen  vortioxetine HBr (TRINTELLIX) 20 MG TABS Take 20 mg by mouth daily.   Marland Kitchen acetaminophen-codeine (TYLENOL #3) 300-30 MG tablet TAKE 1 TABLET BY MOUTH EVERY 6 TO 8 HOURS as needed for pain  . cyclobenzaprine (FLEXERIL) 10 MG tablet Take 1 tablet (10 mg total) by mouth at bedtime as needed for muscle spasms. (Patient not taking: Reported on 01/13/2017)  . ondansetron (ZOFRAN) 4 MG tablet TAKE 1 TABLET BY MOUTH EVERY 8 HOURS AS NEEDED FOR NAUSEA AND VOMITING (Patient not taking: Reported on 01/13/2017)  . sodium fluoride (PREVIDENT 5000 DRY MOUTH) 1.1 % GEL dental gel Place 1 application onto teeth at bedtime.  . traMADol (ULTRAM) 50 MG tablet TAKE 1 TABLET BY MOUTH EVERY 6 TO 8 HOURS as needed for pain  . [DISCONTINUED] aspirin EC 81 MG tablet Take 81 mg by mouth daily.   . [DISCONTINUED] atorvastatin (LIPITOR) 40 MG tablet Take 1 tablet (40 mg total) by mouth daily.  . [DISCONTINUED] darifenacin (ENABLEX) 15 MG 24 hr tablet Take 1 tablet (15 mg total) by mouth daily.  . [DISCONTINUED] Ferrous Fumarate-Vitamin C ER (FERRO-SEQUELS) 65-25 MG TBCR Take 1 tablet by mouth daily.  . [DISCONTINUED] pantoprazole (PROTONIX) 40 MG tablet Take 1 tablet (40 mg total) by mouth 2 (two) times daily before a meal.   Facility-Administered Encounter Medications as of 01/13/2017  Medication  . 0.9 %  sodium chloride infusion    Activities of Daily Living In your present state of health, do you have any difficulty performing the following activities: 01/13/2017  Hearing? N  Vision? N  Comment wearing glasses. Dr.Scott yearly.  Difficulty concentrating or making decisions? Y  Comment Dr.Tonuzi for neurology. On meds for memory  Walking or climbing stairs? N  Dressing or bathing? N  Doing errands, shopping? N  Preparing Food and eating ? N  Using the Toilet? N  In  the past six months, have you accidently leaked urine? Y  Comment wears denpends underwear  Do you have problems with loss of bowel control? N  Managing your Medications? N  Managing your Finances? N  Housekeeping or managing your Housekeeping? N  Some recent data might be hidden    Patient Care Team: Colon Branch, MD as PCP - General [address] Max T, DPM as Consulting Physician (Podiatry) Karl Luke, MD as Referring Physician (Neurology) Delrae Rend, MD as Consulting Physician (Endocrinology) Macarthur Critchley, [place] as Referring Physician (Optometry) Norma Fredrickson, MD as Consulting Physician (Psychiatry)    Assessment:   This is a routine wellness examination for [name]. Physical assessment deferred to PCP.  Exercise Activities and Dietary recommendations Current Exercise Habits: Structured exercise class, Type of exercise: calisthenics, Time (Minutes): 30, Frequency (Times/Week): 1, Weekly Exercise (Minutes/Week): 30, Intensity: Mild, Exercise limited by: None identified   Diet (meal preparation, eat out, water intake, caffeinated beverages, dairy products, fruits and vegetables): well balanced, on average, 2 meals per day     Goals    . Increase physical activity       Fall Risk Fall Risk  01/13/2017 01/12/2016 09/18/2015 09/21/2014 12/24/2013  Falls in the past year? Yes No No No No  Number falls in past yr: 1 - - - -  Injury with Fall? No - - - -  Risk for fall due to : (No Data) - - - -  Risk for fall due to: Comment pt states she was dx with L foot drop - - - -  Follow up Education provided;Falls prevention discussed - - - -  Depression Screen PHQ 2/9 Scores 01/13/2017 09/11/2016 01/12/2016 09/18/2015  PHQ - 2 Score 0 0 0 0  PHQ- 9 Score - 0 - -     Cognitive Function MMSE - Mini Mental State Exam 01/13/2017 01/12/2016  Orientation to time 5 5  Orientation to Place 5 5  Registration 3 3  Attention/ Calculation 5 5  Recall 2 3  Language- name 2 objects 2 2  Language- repeat 1 1   Language- follow 3 step command 3 3  Language- read & follow direction 1 1  Write a sentence 1 1  Copy design 1 1  Total score 29 30        Immunization History  Administered Date(s) Administered  . Influenza Split 11/26/2010, 11/28/2011, 10/26/2013  . Influenza, High Dose Seasonal PF 09/21/2014, 09/18/2015, 10/09/2016  . Influenza,inj,Quad PF,6+ Mos 10/27/2012  . Pneumococcal Conjugate-13 12/24/2013  . Pneumococcal Polysaccharide-23 06/09/2007, 11/30/2012  . Tetanus 11/30/2012  . Zoster 03/24/2008   Screening Tests Health Maintenance  Topic Date Due  . OPHTHALMOLOGY EXAM  11/18/2015  . HEMOGLOBIN A1C  03/06/2017  . FOOT EXAM  09/04/2017  . MAMMOGRAM  10/31/2017  . COLONOSCOPY  04/15/2019  . TETANUS/TDAP  12/01/2022  . INFLUENZA VACCINE  Completed  . DEXA SCAN  Completed  . Hepatitis C Screening  Completed  . PNA vac Low Risk Adult  Completed   Plan:   Follow up with Dr.Paz tomorrow as scheduled  Eat heart healthy diet (full of fruits, vegetables, whole grains, lean protein, water--limit salt, fat, and sugar intake) and increase physical activity as tolerated.  Please eat at least 2 meals per day. Limit your soda intake.  Continue doing brain stimulating activities (puzzles, reading, adult coloring books, staying active) to keep memory sharp.    I have personally reviewed and noted the following in the patient's chart:   . Medical and social history . Use of alcohol, tobacco or illicit drugs  . Current medications and supplements . Functional ability and status . Nutritional status . Physical activity . Advanced directives . List of other physicians . Hospitalizations, surgeries, and ER visits in previous 12 months . Vitals . Screenings to include cognitive, depression, and falls . Referrals and appointments  In addition, I have reviewed and discussed with patient certain preventive protocols, quality metrics, and best practice recommendations. A written  personalized care plan for preventive services as well as general preventive health recommendations were provided to patient.     Naaman Plummer [place], [place]  01/13/2017  Kathlene November, MD

## 2017-01-09 ENCOUNTER — Other Ambulatory Visit: Payer: Self-pay | Admitting: Internal Medicine

## 2017-01-13 ENCOUNTER — Encounter: Payer: Self-pay | Admitting: *Deleted

## 2017-01-13 ENCOUNTER — Ambulatory Visit (INDEPENDENT_AMBULATORY_CARE_PROVIDER_SITE_OTHER): Payer: Medicare Other | Admitting: *Deleted

## 2017-01-13 VITALS — BP 136/68 | HR 67 | Ht 65.0 in | Wt 171.0 lb

## 2017-01-13 DIAGNOSIS — Z0000 Encounter for general adult medical examination without abnormal findings: Secondary | ICD-10-CM

## 2017-01-13 NOTE — Patient Instructions (Signed)
Follow up with Dr.Paz tomorrow as scheduled  Eat heart healthy diet (full of fruits, vegetables, whole grains, lean protein, water--limit salt, fat, and sugar intake) and increase physical activity as tolerated.  Please eat at least 2 meals per day. Limit your soda intake.  Continue doing brain stimulating activities (puzzles, reading, adult coloring books, staying active) to keep memory sharp.    Ms. [name] , Thank you for taking time to come for your Medicare Wellness Visit. I appreciate your ongoing commitment to your health goals. Please review the following plan we discussed and let me know if I can assist you in the future.   These are the goals we discussed: Goals    . Increase physical activity       This is a list of the screening recommended for you and due dates:  Health Maintenance  Topic Date Due  . Eye exam for diabetics  11/18/2015  . Hemoglobin A1C  03/06/2017  . Complete foot exam   09/04/2017  . Mammogram  10/31/2017  . Colon Cancer Screening  04/15/2019  . Tetanus Vaccine  12/01/2022  . Flu Shot  Completed  . DEXA scan (bone density measurement)  Completed  .  Hepatitis C: One time screening is recommended by Center for Disease Control  (CDC) for  adults born from [birth year] through 1965.   Completed  . Pneumonia vaccines  Completed    Health Maintenance for Postmenopausal Women Menopause is a normal process in which your reproductive ability comes to an end. This process happens gradually over a span of months to years, usually between the ages of 49 and 54. Menopause is complete when you have missed 12 consecutive menstrual periods. It is important to talk with your health care provider about some of the most common conditions that affect postmenopausal women, such as heart disease, cancer, and bone loss (osteoporosis). Adopting a healthy lifestyle and getting preventive care can help to promote your health and wellness. Those actions can also lower your chances of  developing some of these common conditions. What should I know about menopause? During menopause, you may experience a number of symptoms, such as:  Moderate-to-severe hot flashes.  Night sweats.  Decrease in sex drive.  Mood swings.  Headaches.  Tiredness.  Irritability.  Memory problems.  Insomnia.  Choosing to treat or not to treat menopausal changes is an individual decision that you make with your health care provider. What should I know about hormone replacement therapy and supplements? Hormone therapy products are effective for treating symptoms that are associated with menopause, such as hot flashes and night sweats. Hormone replacement carries certain risks, especially as you become older. If you are thinking about using estrogen or estrogen with progestin treatments, discuss the benefits and risks with your health care provider. What should I know about heart disease and stroke? Heart disease, heart attack, and stroke become more likely as you age. This may be due, in part, to the hormonal changes that your body experiences during menopause. These can affect how your body processes dietary fats, triglycerides, and cholesterol. Heart attack and stroke are both medical emergencies. There are many things that you can do to help prevent heart disease and stroke:  Have your blood pressure checked at least every 1-2 years. High blood pressure causes heart disease and increases the risk of stroke.  If you are 42-51 years old, ask your health care provider if you should take aspirin to prevent a heart attack or a stroke.  Do not use any tobacco products, including cigarettes, chewing tobacco, or electronic cigarettes. If you need help quitting, ask your health care provider.  It is important to eat a healthy diet and maintain a healthy weight. ? Be sure to include plenty of vegetables, fruits, low-fat dairy products, and lean protein. ? Avoid eating foods that are high in solid  fats, added sugars, or salt (sodium).  Get regular exercise. This is one of the most important things that you can do for your health. ? Try to exercise for at least 150 minutes each week. The type of exercise that you do should increase your heart rate and make you sweat. This is known as moderate-intensity exercise. ? Try to do strengthening exercises at least twice each week. Do these in addition to the moderate-intensity exercise.  Know your numbers.Ask your health care provider to check your cholesterol and your blood glucose. Continue to have your blood tested as directed by your health care provider.  What should I know about cancer screening? There are several types of cancer. Take the following steps to reduce your risk and to catch any cancer development as early as possible. Breast Cancer  Practice breast self-awareness. ? This means understanding how your breasts normally appear and feel. ? It also means doing regular breast self-exams. Let your health care provider know about any changes, no matter how small.  If you are 65 or older, have a clinician do a breast exam (clinical breast exam or CBE) every year. Depending on your age, family history, and medical history, it may be recommended that you also have a yearly breast X-ray (mammogram).  If you have a family history of breast cancer, talk with your health care provider about genetic screening.  If you are at high risk for breast cancer, talk with your health care provider about having an MRI and a mammogram every year.  Breast cancer (BRCA) gene test is recommended for women who have family members with BRCA-related cancers. Results of the assessment will determine the need for genetic counseling and BRCA1 and for BRCA2 testing. BRCA-related cancers include these types: ? Breast. This occurs in males or females. ? Ovarian. ? Tubal. This may also be called fallopian tube cancer. ? Cancer of the abdominal or pelvic lining  (peritoneal cancer). ? Prostate. ? Pancreatic.  Cervical, Uterine, and Ovarian Cancer Your health care provider may recommend that you be screened regularly for cancer of the pelvic organs. These include your ovaries, uterus, and vagina. This screening involves a pelvic exam, which includes checking for microscopic changes to the surface of your cervix (Pap test).  For women ages 21-65, health care providers may recommend a pelvic exam and a Pap test every three years. For women ages 80-65, they may recommend the Pap test and pelvic exam, combined with testing for human papilloma virus (HPV), every five years. Some types of HPV increase your risk of cervical cancer. Testing for HPV may also be done on women of any age who have unclear Pap test results.  Other health care providers may not recommend any screening for nonpregnant women who are considered low risk for pelvic cancer and have no symptoms. Ask your health care provider if a screening pelvic exam is right for you.  If you have had past treatment for cervical cancer or a condition that could lead to cancer, you need Pap tests and screening for cancer for at least 20 years after your treatment. If Pap tests have been discontinued  for you, your risk factors (such as having a new sexual partner) need to be reassessed to determine if you should start having screenings again. Some women have medical problems that increase the chance of getting cervical cancer. In these cases, your health care provider may recommend that you have screening and Pap tests more often.  If you have a family history of uterine cancer or ovarian cancer, talk with your health care provider about genetic screening.  If you have vaginal bleeding after reaching menopause, tell your health care provider.  There are currently no reliable tests available to screen for ovarian cancer.  Lung Cancer Lung cancer screening is recommended for adults 55-80 years old who are at  high risk for lung cancer because of a history of smoking. A yearly low-dose CT scan of the lungs is recommended if you:  Currently smoke.  Have a history of at least 30 pack-years of smoking and you currently smoke or have quit within the past 15 years. A pack-year is smoking an average of one pack of cigarettes per day for one year.  Yearly screening should:  Continue until it has been 15 years since you quit.  Stop if you develop a health problem that would prevent you from having lung cancer treatment.  Colorectal Cancer  This type of cancer can be detected and can often be prevented.  Routine colorectal cancer screening usually begins at age 50 and continues through age 75.  If you have risk factors for colon cancer, your health care provider may recommend that you be screened at an earlier age.  If you have a family history of colorectal cancer, talk with your health care provider about genetic screening.  Your health care provider may also recommend using home test kits to check for hidden blood in your stool.  A small camera at the end of a tube can be used to examine your colon directly (sigmoidoscopy or colonoscopy). This is done to check for the earliest forms of colorectal cancer.  Direct examination of the colon should be repeated every 5-10 years until age 75. However, if early forms of precancerous polyps or small growths are found or if you have a family history or genetic risk for colorectal cancer, you may need to be screened more often.  Skin Cancer  Check your skin from head to toe regularly.  Monitor any moles. Be sure to tell your health care provider: ? About any new moles or changes in moles, especially if there is a change in a mole's shape or color. ? If you have a mole that is larger than the size of a pencil eraser.  If any of your family members has a history of skin cancer, especially at a young age, talk with your health care provider about genetic  screening.  Always use sunscreen. Apply sunscreen liberally and repeatedly throughout the day.  Whenever you are outside, protect yourself by wearing long sleeves, pants, a wide-brimmed hat, and sunglasses.  What should I know about osteoporosis? Osteoporosis is a condition in which bone destruction happens more quickly than new bone creation. After menopause, you may be at an increased risk for osteoporosis. To help prevent osteoporosis or the bone fractures that can happen because of osteoporosis, the following is recommended:  If you are 19-50 years old, get at least 1,000 mg of calcium and at least 600 mg of vitamin D per day.  If you are older than age 50 but younger than age 70, get   at least 1,200 mg of calcium and at least 600 mg of vitamin D per day.  If you are older than age 70, get at least 1,200 mg of calcium and at least 800 mg of vitamin D per day.  Smoking and excessive alcohol intake increase the risk of osteoporosis. Eat foods that are rich in calcium and vitamin D, and do weight-bearing exercises several times each week as directed by your health care provider. What should I know about how menopause affects my mental health? Depression may occur at any age, but it is more common as you become older. Common symptoms of depression include:  Low or sad mood.  Changes in sleep patterns.  Changes in appetite or eating patterns.  Feeling an overall lack of motivation or enjoyment of activities that you previously enjoyed.  Frequent crying spells.  Talk with your health care provider if you think that you are experiencing depression. What should I know about immunizations? It is important that you get and maintain your immunizations. These include:  Tetanus, diphtheria, and pertussis (Tdap) booster vaccine.  Influenza every year before the flu season begins.  Pneumonia vaccine.  Shingles vaccine.  Your health care provider may also recommend other  immunizations. This information is not intended to replace advice given to you by your health care provider. Make sure you discuss any questions you have with your health care provider. Document Released: 02/15/2005 Document Revised: 07/14/2015 Document Reviewed: 09/27/2014 Elsevier Interactive Patient Education  2018 [employer]  

## 2017-01-14 ENCOUNTER — Ambulatory Visit (INDEPENDENT_AMBULATORY_CARE_PROVIDER_SITE_OTHER): Payer: Medicare Other | Admitting: Internal Medicine

## 2017-01-14 ENCOUNTER — Encounter: Payer: Self-pay | Admitting: Internal Medicine

## 2017-01-14 VITALS — BP 132/78 | HR 69 | Temp 98.0°F | Resp 14 | Ht 65.0 in | Wt 169.1 lb

## 2017-01-14 DIAGNOSIS — E119 Type 2 diabetes mellitus without complications: Secondary | ICD-10-CM

## 2017-01-14 DIAGNOSIS — G4733 Obstructive sleep apnea (adult) (pediatric): Secondary | ICD-10-CM

## 2017-01-14 DIAGNOSIS — E222 Syndrome of inappropriate secretion of antidiuretic hormone: Secondary | ICD-10-CM

## 2017-01-14 DIAGNOSIS — E538 Deficiency of other specified B group vitamins: Secondary | ICD-10-CM | POA: Diagnosis not present

## 2017-01-14 DIAGNOSIS — I10 Essential (primary) hypertension: Secondary | ICD-10-CM | POA: Diagnosis not present

## 2017-01-14 DIAGNOSIS — M8580 Other specified disorders of bone density and structure, unspecified site: Secondary | ICD-10-CM

## 2017-01-14 DIAGNOSIS — F329 Major depressive disorder, single episode, unspecified: Secondary | ICD-10-CM | POA: Diagnosis not present

## 2017-01-14 DIAGNOSIS — Z0000 Encounter for general adult medical examination without abnormal findings: Secondary | ICD-10-CM

## 2017-01-14 DIAGNOSIS — E785 Hyperlipidemia, unspecified: Secondary | ICD-10-CM | POA: Diagnosis not present

## 2017-01-14 LAB — CBC WITH DIFFERENTIAL/PLATELET
Basophils Absolute: 0 10*3/uL (ref 0.0–0.1)
Basophils Relative: 0.6 % (ref 0.0–3.0)
Eosinophils Absolute: 0.6 10*3/uL (ref 0.0–0.7)
Eosinophils Relative: 9.1 % — ABNORMAL HIGH (ref 0.0–5.0)
HCT: 38.5 % (ref 36.0–46.0)
Hemoglobin: 12.8 g/dL (ref 12.0–15.0)
Lymphocytes Relative: 27.5 % (ref 12.0–46.0)
Lymphs Abs: 1.9 10*3/uL (ref 0.7–4.0)
MCHC: 33.2 g/dL (ref 30.0–36.0)
MCV: 89.5 fl (ref 78.0–100.0)
Monocytes Absolute: 0.4 10*3/uL (ref 0.1–1.0)
Monocytes Relative: 5.6 % (ref 3.0–12.0)
Neutro Abs: 3.9 10*3/uL (ref 1.4–7.7)
Neutrophils Relative %: 57.2 % (ref 43.0–77.0)
Platelets: 203 10*3/uL (ref 150.0–400.0)
RBC: 4.29 Mil/uL (ref 3.87–5.11)
RDW: 14.2 % (ref 11.5–15.5)
WBC: 6.8 10*3/uL (ref 4.0–10.5)

## 2017-01-14 LAB — LIPID PANEL
Cholesterol: 118 mg/dL (ref 0–200)
HDL: 48.6 mg/dL (ref >39.00)
LDL Cholesterol: 47 mg/dL (ref 0–99)
NonHDL: 69.65
Total CHOL/HDL Ratio: 2
Triglycerides: 115 mg/dL (ref 0.0–149.0)
VLDL: 23 mg/dL (ref 0.0–40.0)

## 2017-01-14 LAB — VITAMIN B12: Vitamin B-12: 1144 pg/mL — ABNORMAL HIGH (ref 211–911)

## 2017-01-14 NOTE — Patient Instructions (Signed)
GO TO THE LAB : Get the blood work     GO TO THE FRONT DESK Schedule your next appointment for a   checkup in 6 months 

## 2017-01-14 NOTE — Progress Notes (Signed)
Patient ID: [name], female    DOB: 12/18/1945, 72 y.o.   MRN: [id number]  DOS:  01/14/2017 Type of visit - description : CPX, her daughter [name] joined [place] via telephone Interval history: In general feeling well, no major concerns, good compliance with medications.  Wt Readings from Last 3 Encounters:  01/14/17 169 lb 2 oz (76.7 kg)  01/13/17 171 lb (77.6 kg)  12/05/16 176 lb 4 oz (79.9 kg)    Review of Systems Last few months had persisting headache, nausea, all that seems to be resolved. Did have the flu 4 weeks ago, was seen at the urgent care elsewhere, still have a residual cough but otherwise no fever chills and feels well. No chest pain, difficulty breathing, vomiting or diarrhea She has a history of OSA, energy  level seems to be wnl. Mild weight loss noted, appetite is somewhat decreased but overall she feels well  Other than above, a 14 point review of systems is negative    Past Medical History:  Diagnosis Date  . Adenomatous colon polyp 03/1988  . Anemia    borderline  . Arthritis    R shoulder - degenerative   . Cataract    beginning stages  . Chest pain     Sept 2011: stress test neg  . Depression    sees Dr.Cotle  . Diabetes mellitus    dr Buddy Duty  . Diverticulosis   . Fatty liver    Increased LFTs, saw GI 06/2011, likely from fatty liver   . GERD (gastroesophageal reflux disease)   . History of hiatal hernia   . Hyperlipemia   . Hypertension   . IBS (irritable bowel syndrome)    and dyspepsia  . Memory impairment 08/2014   mild cognitive impairment vs. mild dementia, reommended reinstating of cholinesterase inhibitor   . Osteopenia    dexa 06/2007 and 10/11 Rx CA vitamin d  . RLS (restless legs syndrome)    h/o- no longer using Requip    Past Surgical History:  Procedure Laterality Date  . BREAST BIOPSY Left 10/11/2015   fibroadenoma w/ calcifications, no evidence of malignancy, recommend mammogram repeat-6 mnths  . POLYPECTOMY    . REVERSE  SHOULDER ARTHROPLASTY Right 04/21/2014   Procedure: REVERSE SHOULDER ARTHROPLASTY;  Surgeon: Tania Ade, MD;  Location: [facility];  Service: Orthopedics;  Laterality: Right;  Right reverse total shoulder replacement  . TONSILLECTOMY    . UTERINE FIBROID EMBOLIZATION     90s    Social History   Socioeconomic History  . Marital status: Widowed    Spouse name: [name]  . Number of children: 3  . Years of education: Not on file  . Highest education level: Not on file  Social Needs  . Financial resource strain: Not on file  . Food insecurity - worry: Not on file  . Food insecurity - inability: Not on file  . Transportation needs - medical: Not on file  . Transportation needs - non-medical: Not on file  Occupational History  . Occupation: retired, was a Leisure centre manager)    Comment: RN  Tobacco Use  . Smoking status: Former Smoker    Packs/day: 2.00    Years: 10.00    Pack years: 20.00    Types: Cigarettes    Last attempt to quit: 11/26/1975    Years since quitting: 41.1  . Smokeless tobacco: Never Used  . Tobacco comment: used to smoke 2 ppd  Substance and Sexual Activity  . Alcohol use:  Yes    Alcohol/week: 0.0 oz    Comment: rarely  . Drug use: No  . Sexual activity: Not Currently  Other Topics Concern  . Not on file  Social History Narrative   Widow , lives by herself at [facility] in a 1 bedroom apt;  still drives, daughters [name]) live  in [place] in [place]     5 g-children     Family History  Problem Relation Age of Onset  . Lung cancer Mother        smoker  . Colon cancer Father        F dx in his 95s  . Ovarian cancer Paternal Aunt        ?  . Diabetes Other        aunts-uncles   . Heart attack Other        GM in her 38s  . Stroke Other        aunts-uncles   . Breast cancer Neg Hx   . Rectal cancer Neg Hx   . Stomach cancer Neg Hx   . Esophageal cancer Neg Hx     Allergies as of 01/14/2017      Reactions   Sulfa Antibiotics  Nausea Only   Trulicity [dulaglutide] Nausea Only   Penicillins Rash, Other (See Comments)   Has patient had a PCN reaction causing immediate rash, facial/tongue/throat swelling, SOB or lightheadedness with hypotension: No Has patient had a PCN reaction causing severe rash involving mucus membranes or skin necrosis: No Has patient had a PCN reaction that required hospitalization No Has patient had a PCN reaction occurring within the last 10 years: No If all of the above answers are "NO", then may proceed with Cephalosporin use.      Medication List        Accurate as of 01/14/17  5:56 PM. Always use your most recent med list.          ARICEPT 10 MG tablet Generic drug:  donepezil Take 2 tablets (20 mg total) by mouth at bedtime.   aspirin 81 MG EC tablet Commonly known as:  ASPIR-LOW Take 1 tablet (81 mg total) by mouth daily.   atenolol 50 MG tablet Commonly known as:  TENORMIN Take 0.5 tablets (25 mg total) by mouth daily.   atorvastatin 40 MG tablet Commonly known as:  LIPITOR Take 1 tablet (40 mg total) by mouth daily.   CENTRUM SILVER 50+WOMEN Tabs Take 1 tablet by mouth daily.   darifenacin 15 MG 24 hr tablet Commonly known as:  ENABLEX Take 1 tablet (15 mg total) by mouth daily.   Ferrous Fumarate-Vitamin C ER 65-25 MG Tbcr Commonly known as:  FERRO-SEQUELS Take 1 tablet by mouth daily.   LEVEMIR FLEXTOUCH 100 UNIT/ML Pen Generic drug:  Insulin Detemir Inject 52 Units into the skin at bedtime.   losartan 25 MG tablet Commonly known as:  COZAAR Take 1 tablet (25 mg total) by mouth every evening.   memantine 5 MG tablet Commonly known as:  NAMENDA Take 5 mg by mouth 2 (two) times daily.   pantoprazole 40 MG tablet Commonly known as:  PROTONIX TAKE 1 TABLET BY MOUTH 2 TIMES DAILY BEFORE A MEAL   PREVIDENT 5000 DRY MOUTH 1.1 % Gel dental gel Generic drug:  sodium fluoride Place 1 application onto teeth at bedtime.   TRINTELLIX 20 MG Tabs Generic  drug:  vortioxetine HBr Take 20 mg by mouth daily.   vitamin B-12  1000 MCG tablet Commonly known as:  CYANOCOBALAMIN Take 1 tablet (1,000 mcg total) by mouth daily.          Objective:   Physical Exam BP 132/78 (BP Location: Left Arm, Patient Position: Sitting, Cuff Size: Small)   Pulse 69   Temp 98 F (36.7 C) (Oral)   Resp 14   Ht 5\' 5"  (1.651 m)   Wt 169 lb 2 oz (76.7 kg)   SpO2 96%   BMI 28.14 kg/m   General:   Well developed, well nourished . NAD.  Neck: No  thyromegaly  HEENT:  Normocephalic . Face symmetric, atraumatic Lungs:  CTA B Normal respiratory effort, no intercostal retractions, no accessory muscle use. Heart: RRR,  no murmur.  No pretibial edema bilaterally  Abdomen:  Not distended, soft, non-tender. No rebound or rigidity.   Skin: Exposed areas without rash. Not pale. Not jaundice Neurologic:  alert & oriented X3.  Speech normal, gait appropriate for age and unassisted Strength symmetric and appropriate for age.  Psych: Cognition and judgment appear intact.  Cooperative with normal attention span and concentration.  Behavior appropriate. No anxious or depressed appearing.     Assessment & Plan:   Assessment  DM Dr. Buddy Duty HTN Hyperlipidemia Depression Dr. Clovis Pu OSA -- mild, saw Dr Gwenette Greet 2014, no CPAP GI: --IBS, colon polyps, diverticulosis, hiatal hernia --Chronic constipation likely part of her IBS syndrome  --Fatty liver >>> GI eval 2013 --iron fec anemia:  cscope 04-2014 : 2 polyps; + hemocult @ GI office 10-2014: EGD done (-), bx neg Osteopenia: T score -1.1  2009 , osteopenia again per dexa 05-2013 , rx ca-vit d B12 deficiency  RLS MSK: --DJD frozen shoulder Mild cognitive impairment  MMSE 2015 --> 28, on Aricept, namenda added 04-2016 sees Dr Everette Rank  Dizziness: chronic, carotid US 2016 neg, saw cards-- not likely CV related; saw neuro DR Everette Rank 02-2015  Chest pain 09-2009, negative stress test Thyroid nodules: Incidental   by  Carotid US, BX 11-2014: Atypical findings, [facility], f/u  Endocrine RLL Lung nodule: Stable per CT 09-2016, no need for further  PLAN: DM: Per Dr. Buddy Duty HTN: Controlled on Tenormin, losartan. 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  Last LFTs satisfactory Depression: Currently well controlled, l does not plan to see Dr. Clovis Pu again, she is looking for another psychiatrist, until then I will RF meds prn H/o mild OSA: Reports energy level is satisfactory today Chronic nausea: Improved. Osteopenia: T score -1.80 on  09-2015, on calcium and vitamin D.  No history of vitamin D deficiency B 12 deficiency: Currently on a multivitamin daily, check levels.  Consider restart injections or p.o. supplements Pain management: stopped tramadol and Tylenol 3, on Tylenol only.  Sxs relatively well controlled. RTC 6 months

## 2017-01-14 NOTE — Assessment & Plan Note (Addendum)
-  Td-- 2014; pneumovax 2009-2014;  prevnar: 12-2013;  shingles shot 2010; had a Flu shot  . Shingrix discussed -female care  No recent visit w/  Gynecology, no h/o abnormal paps; plans to see Dr Pamala Hurry; last mammogram 10-2016 (-) -CCS: FH Colon cancer, Cscope : 04/18/2009, cscope again 04-2014: +polyps, 5 years per GI letter  -Labs: FLP, B12, CBC

## 2017-01-14 NOTE — Assessment & Plan Note (Signed)
DM: Per Dr. Buddy Duty HTN: Controlled on Tenormin, losartan. 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  Last LFTs satisfactory Depression: Currently well controlled, l does not plan to see Dr. Clovis Pu again, she is looking for another psychiatrist, until then I will RF meds prn H/o mild OSA: Reports energy level is satisfactory today Chronic nausea: Improved. Osteopenia: T score -1.80 on  09-2015, on calcium and vitamin D.  No history of vitamin D deficiency B 12 deficiency: Currently on a multivitamin daily, check levels.  Consider restart injections or p.o. supplements Pain management: stopped tramadol and Tylenol 3, on Tylenol only.  Sxs relatively well controlled. RTC 6 months

## 2017-01-14 NOTE — Progress Notes (Signed)
Pre visit review using our clinic review tool, if applicable. No additional management support is needed unless otherwise documented below in the visit note. 

## 2017-01-30 LAB — BASIC METABOLIC PANEL
BUN: 9 (ref 4–21)
Creatinine: 0.6 (ref <=1.1)
Glucose: 141
Potassium: 4.5 (ref 3.4–5.3)
Sodium: 140 (ref 137–147)

## 2017-02-04 ENCOUNTER — Ambulatory Visit: Payer: Medicare Other | Admitting: Podiatry

## 2017-02-05 ENCOUNTER — Other Ambulatory Visit: Payer: Self-pay | Admitting: Internal Medicine

## 2017-02-18 ENCOUNTER — Ambulatory Visit: Payer: Medicare Other | Admitting: Podiatry

## 2017-02-25 ENCOUNTER — Encounter: Payer: Self-pay | Admitting: Podiatry

## 2017-02-25 ENCOUNTER — Ambulatory Visit (INDEPENDENT_AMBULATORY_CARE_PROVIDER_SITE_OTHER): Payer: Medicare Other | Admitting: Podiatry

## 2017-02-25 DIAGNOSIS — M79676 Pain in unspecified toe(s): Secondary | ICD-10-CM | POA: Diagnosis not present

## 2017-02-25 DIAGNOSIS — B351 Tinea unguium: Secondary | ICD-10-CM

## 2017-02-25 NOTE — Progress Notes (Signed)
She presents today chief complaint of painful elongated toenails 1 through 5 bilaterally.  Objective: Vital signs are stable alert and oriented x3.  Pulses are palpable.  Toenails are long thick yellow dystrophic click mycotic no open lesions or wounds.  Onychocryptosis to the hallux nails bilaterally.  Assessment: Pain in limb secondary to onychomycosis onychocryptosis.  Plan: Debridement of toenails 1 through 5 bilateral cover service secondary to pain.

## 2017-02-26 ENCOUNTER — Encounter (HOSPITAL_COMMUNITY): Payer: Self-pay | Admitting: *Deleted

## 2017-02-26 ENCOUNTER — Emergency Department (HOSPITAL_COMMUNITY): Payer: Medicare Other

## 2017-02-26 ENCOUNTER — Other Ambulatory Visit: Payer: Self-pay

## 2017-02-26 ENCOUNTER — Emergency Department (HOSPITAL_COMMUNITY)
Admission: EM | Admit: 2017-02-26 | Discharge: 2017-02-26 | Disposition: A | Discharge status: Home / Self Care | Payer: Medicare Other | Attending: Emergency Medicine | Admitting: Emergency Medicine

## 2017-02-26 DIAGNOSIS — M25512 Pain in left shoulder: Secondary | ICD-10-CM | POA: Insufficient documentation

## 2017-02-26 DIAGNOSIS — Z79899 Other long term (current) drug therapy: Secondary | ICD-10-CM | POA: Diagnosis not present

## 2017-02-26 DIAGNOSIS — E119 Type 2 diabetes mellitus without complications: Secondary | ICD-10-CM | POA: Insufficient documentation

## 2017-02-26 DIAGNOSIS — I10 Essential (primary) hypertension: Secondary | ICD-10-CM | POA: Diagnosis not present

## 2017-02-26 DIAGNOSIS — R0789 Other chest pain: Secondary | ICD-10-CM | POA: Diagnosis not present

## 2017-02-26 DIAGNOSIS — Z87891 Personal history of nicotine dependence: Secondary | ICD-10-CM | POA: Insufficient documentation

## 2017-02-26 DIAGNOSIS — Z7982 Long term (current) use of aspirin: Secondary | ICD-10-CM | POA: Insufficient documentation

## 2017-02-26 DIAGNOSIS — R079 Chest pain, unspecified: Secondary | ICD-10-CM

## 2017-02-26 DIAGNOSIS — Z794 Long term (current) use of insulin: Secondary | ICD-10-CM | POA: Insufficient documentation

## 2017-02-26 LAB — BASIC METABOLIC PANEL WITH GFR
Anion gap: 12 (ref 5–15)
BUN: 5 mg/dL — ABNORMAL LOW (ref 6–20)
CO2: 22 mmol/L (ref 22–32)
Calcium: 9.5 mg/dL (ref 8.9–10.3)
Chloride: 99 mmol/L — ABNORMAL LOW (ref 101–111)
Creatinine, Ser: 0.66 mg/dL (ref 0.44–1.00)
GFR calc Af Amer: >60 mL/min
GFR calc non Af Amer: >60 mL/min
Glucose, Bld: 86 mg/dL (ref 65–99)
Potassium: 4.5 mmol/L (ref 3.5–5.1)
Sodium: 133 mmol/L — ABNORMAL LOW (ref 135–145)

## 2017-02-26 LAB — CBC
HCT: 34.4 % — ABNORMAL LOW (ref 36.0–46.0)
Hemoglobin: 11.8 g/dL — ABNORMAL LOW (ref 12.0–15.0)
MCH: 29.3 pg (ref 26.0–34.0)
MCHC: 34.3 g/dL (ref 30.0–36.0)
MCV: 85.4 fL (ref 78.0–100.0)
Platelets: 177 K/uL (ref 150–400)
RBC: 4.03 MIL/uL (ref 3.87–5.11)
RDW: 13 % (ref 11.5–15.5)
WBC: 6 K/uL (ref 4.0–10.5)

## 2017-02-26 LAB — I-STAT TROPONIN, ED
Troponin i, poc: 0 ng/mL (ref 0.00–0.08)
Troponin i, poc: 0 ng/mL (ref 0.00–0.08)

## 2017-02-26 MED ORDER — NITROGLYCERIN 0.4 MG SL SUBL
0.4000 mg | SUBLINGUAL_TABLET | SUBLINGUAL | Status: DC | PRN
  Administered 2017-02-26: 0.4 mg via SUBLINGUAL
  Filled 2017-02-26: qty 1

## 2017-02-26 MED ORDER — ACETAMINOPHEN 500 MG PO TABS
1000.0000 mg | ORAL_TABLET | Freq: Once | ORAL | Status: AC
Start: 2017-02-26 — End: 2017-02-26
  Administered 2017-02-26: 1000 mg via ORAL
  Filled 2017-02-26: qty 2

## 2017-02-26 NOTE — ED Triage Notes (Signed)
Pt here via [facility] from home after waking up with sharp, L shoulder and arm pain that then radiated to L chest.  En-route pt given 324 asa.  States some sob.

## 2017-02-26 NOTE — ED Notes (Signed)
Patient transported to X-ray 

## 2017-02-26 NOTE — ED Provider Notes (Signed)
[facility]ERGENCY DEPARTMENT Provider Note   CSN: [id number] Arrival date & time: 02/26/17  8299     History   Chief Complaint Chief Complaint  Patient presents with  . Chest Pain    HPI [name] is a 72 y.o. female.  HPI  Presents with concern for left sided chest pain and shoulder pain, rated 4/10 now.  Earlier this AM was 5-6/10.  Constant. Not worse with deep breaths.  Maybe a little worse with exertion. Made severely worse by raising left arm/shoulder abduction.  Had similar symptoms in February of last year thought to be MSK.  No shortness of breath, no nausea, no diaphoresis.  Sometimes coughing up clear sputum for a few weeks, constant throat clearing prior to that, family members also with cough.   No leg pain or swelling, no recent surgeries, long trips, (around [facility] was last to [place])  Hx of DM, htn, hld, no hx of cva/cad No immediate family hx of CAD No smoking, used to many years ago  Saw neurologist, thought headaches related to neck, thinks maybe arm pain could be related too  Received aspirin with EMS  Past Medical History:  Diagnosis Date  . Adenomatous colon polyp 03/1988  . Anemia    borderline  . Arthritis    R shoulder - degenerative   . Cataract    beginning stages  . Chest pain     Sept 2011: stress test neg  . Depression    sees Dr.Cotle  . Diabetes mellitus    dr Buddy Duty  . Diverticulosis   . Fatty liver    Increased LFTs, saw GI 06/2011, likely from fatty liver   . GERD (gastroesophageal reflux disease)   . History of hiatal hernia   . Hyperlipemia   . Hypertension   . IBS (irritable bowel syndrome)    and dyspepsia  . Memory impairment 08/2014   mild cognitive impairment vs. mild dementia, reommended reinstating of cholinesterase inhibitor   . Osteopenia    dexa 06/2007 and 10/11 Rx CA vitamin d  . RLS (restless legs syndrome)    h/o- no longer using Requip    Patient Active Problem List   Diagnosis Date Noted  . SIADH (syndrome of inappropriate ADH production) ([place] [facility]) 10/10/2016  . Gastroesophageal reflux disease 08/06/2016  . PCP Notes >>>>>>>>>>>>>>>>> 09/21/2014  . Rotator cuff tear arthropathy 04/21/2014  . Abnormal MRI, shoulder 12/01/2013  . 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 Cervical radiculitis 11/10/2013  . Bursitis of right shoulder 08/19/2013  . Primary localized osteoarthrosis, lower leg 08/19/2013  . Vitamin B 12 deficiency 05/20/2013  . OSA (obstructive sleep apnea) 02/13/2012  . Pulmonary nodule, last  CT 2016, stable, no f/u 01/28/2012  . Dementia 05/29/2011  . Fatty liver 02/27/2011  . Annual physical exam 11/26/2010  . Osteopenia 10/09/2007  . COLONIC POLYPS 08/22/2006  . DM II (diabetes mellitus, type II), controlled ([place]) 05/19/2006  . Hyperlipidemia 05/19/2006  . Depression 05/19/2006  . SYNDROME, RESTLESS LEGS 05/19/2006  . Essential hypertension 05/19/2006  . IBS -- constipation 05/19/2006    Past Surgical History:  Procedure Laterality Date  . BREAST BIOPSY Left 10/11/2015   fibroadenoma w/ calcifications, no evidence of malignancy, recommend mammogram repeat-6 mnths  . POLYPECTOMY    . REVERSE SHOULDER ARTHROPLASTY Right 04/21/2014   Procedure: REVERSE SHOULDER ARTHROPLASTY;  Surgeon: Tania Ade, MD;  Location: [facility];  Service: Orthopedics;  Laterality: Right;  Right reverse total shoulder replacement  .  TONSILLECTOMY    . UTERINE FIBROID EMBOLIZATION     90s    OB History    No data available       Home Medications    Prior to Admission medications   Medication Sig Start Date End Date Taking? Authorizing Provider  acetaminophen (TYLENOL) 325 MG tablet Take 650 mg by mouth every 6 (six) hours as needed for mild pain or headache.   Yes [provider]  aspirin (ASPIR-LOW) 81 MG EC tablet Take 1 tablet (81 mg total) by mouth daily. 01/09/17  Yes Paz, Alda Berthold, MD  atenolol (TENORMIN) 50 MG tablet Take 0.5  tablets (25 mg total) by mouth daily. 05/05/15  Yes Paz, Alda Berthold, MD  atorvastatin (LIPITOR) 40 MG tablet Take 1 tablet (40 mg total) by mouth daily. 01/09/17  Yes Paz, Alda Berthold, MD  darifenacin (ENABLEX) 15 MG 24 hr tablet Take 1 tablet (15 mg total) by mouth daily. 01/09/17  Yes Paz, Alda Berthold, MD  donepezil (ARICEPT) 10 MG tablet Take 2 tablets (20 mg total) by mouth at bedtime.   Yes Colon Branch, MD  Ferrous Fumarate-Vitamin C ER (FERRO-SEQUELS) 65-25 MG TBCR Take 1 tablet by mouth daily. 01/09/17  Yes Paz, Alda Berthold, MD  Insulin Detemir (LEVEMIR FLEXTOUCH) 100 UNIT/ML Pen Inject 52 Units into the skin at bedtime.    Yes Delrae Rend, MD  losartan (COZAAR) 25 MG tablet Take 1 tablet (25 mg total) by mouth every evening. 02/05/17  Yes Paz, Alda Berthold, MD  memantine (NAMENDA) 10 MG tablet Take 10 mg by mouth 2 (two) times daily. 02/13/17  Yes [provider]  metFORMIN (GLUCOPHAGE) 1000 MG tablet Take 1,000 mg by mouth 2 (two) times daily. 02/13/17  Yes [provider]  Multiple Vitamins-Minerals (CENTRUM SILVER 50+WOMEN) TABS Take 1 tablet by mouth daily. 12/05/16  Yes Shelda Pal, DO  naproxen sodium (ALEVE) 220 MG tablet Take 220-440 mg by mouth every 4 (four) hours as needed (for pain).   Yes [provider]  pantoprazole (PROTONIX) 40 MG tablet TAKE 1 TABLET BY MOUTH 2 TIMES DAILY BEFORE A MEAL 01/09/17  Yes Paz, Jacqulyn Bath E, MD  sodium fluoride (PREVIDENT 5000 DRY MOUTH) 1.1 % GEL dental gel Place 1 application onto teeth at bedtime as needed (for teeth).    Yes [provider]  tiZANidine (ZANAFLEX) 2 MG tablet Take 2-4 mg by mouth 2 (two) times daily as needed. 02/14/17  Yes [provider]  vitamin B-12 (CYANOCOBALAMIN) 1000 MCG tablet Take 1 tablet (1,000 mcg total) by mouth daily. 09/16/16  Yes Paz, Alda Berthold, MD  vortioxetine HBr (TRINTELLIX) 20 MG TABS Take 20 mg by mouth daily.    Yes [provider]    Family History Family History  Problem Relation  Age of Onset  . Lung cancer Mother        smoker  . Colon cancer Father        F dx in his 2s  . Ovarian cancer Paternal Aunt        ?  . Diabetes Other        aunts-uncles   . Heart attack Other        GM in her 62s  . Stroke Other        aunts-uncles   . Breast cancer Neg Hx   . Rectal cancer Neg Hx   . Stomach cancer Neg Hx   . Esophageal cancer Neg Hx  Social History Social History   Tobacco Use  . Smoking status: Former Smoker    Packs/day: 2.00    Years: 10.00    Pack years: 20.00    Types: Cigarettes    Last attempt to quit: 11/26/1975    Years since quitting: 41.2  . Smokeless tobacco: Never Used  . Tobacco comment: used to smoke 2 ppd  Substance Use Topics  . Alcohol use: Yes    Alcohol/week: 0.0 oz    Comment: rarely  . Drug use: No     Allergies   Sulfa antibiotics; Trulicity [dulaglutide]; and Penicillins   Review of Systems Review of Systems  Constitutional: Negative for fever.  HENT: Negative for sore throat.   Eyes: Negative for visual disturbance.  Respiratory: Negative for cough and shortness of breath.   Cardiovascular: Positive for chest pain. Negative for leg swelling.  Gastrointestinal: Negative for abdominal pain, nausea and vomiting.  Genitourinary: Negative for difficulty urinating.  Musculoskeletal: Positive for arthralgias. Negative for back pain and neck pain.  Skin: Negative for rash.  Neurological: Negative for syncope and headaches.     Physical Exam Updated Vital Signs BP 130/70   Pulse 69   Resp 19   Ht 5\' 5"  (1.651 m)   Wt 76.7 kg (169 lb)   SpO2 97%   BMI 28.12 kg/m   Physical Exam  Constitutional: She is oriented to person, place, and time. She appears well-developed and well-nourished. No distress.  HENT:  Head: Normocephalic and atraumatic.  Eyes: Conjunctivae and EOM are normal.  Neck: Normal range of motion.  Cardiovascular: Normal rate, regular rhythm, normal heart sounds and intact distal pulses.  Exam reveals no gallop and no friction rub.  No murmur heard. Mild chest wall tenderness  Pulmonary/Chest: Effort normal and breath sounds normal. No respiratory distress. She has no wheezes. She has no rales.  Abdominal: Soft. She exhibits no distension. There is no tenderness. There is no guarding.  Musculoskeletal: She exhibits no edema or tenderness.  Tenderness of left shoulder, pain with abduction   Neurological: She is alert and oriented to person, place, and time.  Skin: Skin is warm and dry. No rash noted. She is not diaphoretic. No erythema.  Nursing note and vitals reviewed.    ED Treatments / Results  Labs (all labs ordered are listed, but only abnormal results are displayed) Labs Reviewed  BASIC METABOLIC PANEL - Abnormal; Notable for the following components:      Result Value   Sodium 133 (*)    Chloride 99 (*)    BUN 5 (*)    All other components within normal limits  CBC - Abnormal; Notable for the following components:   Hemoglobin 11.8 (*)    HCT 34.4 (*)    All other components within normal limits  I-STAT TROPONIN, ED  I-STAT TROPONIN, ED    EKG  EKG Interpretation  Date/Time:  Wednesday February 26 2017 08:54:09 EST Ventricular Rate:  59 PR Interval:    QRS Duration: 99 QT Interval:  483 QTC Calculation: 479 R Axis:   -38 Text Interpretation:  Sinus rhythm Left axis deviation No significant change since last tracing Confirmed by Gareth Morgan [phone]) on 02/26/2017 9:03:19 AM       Radiology Dg Chest 2 View  Result Date: 02/26/2017 CLINICAL DATA:  New onset left shoulder and chest pain. Shortness of breath. EXAM: CHEST  2 VIEW COMPARISON:  CT of the chest 09/26/2016. FINDINGS: The heart size is normal. Lungs are  chronically hyperexpanded. No focal airspace consolidation is present. There is no edema or effusion. Right shoulder replacement is noted. IMPRESSION: 1. No acute cardiopulmonary disease or significant interval change. 2. Stable COPD.  Electronically Signed   By: San Morelle M.D.   On: 02/26/2017 09:40    Procedures Procedures (including critical care time)  Medications Ordered in ED Medications  nitroGLYCERIN (NITROSTAT) SL tablet 0.4 mg (0.4 mg Sublingual Given 02/26/17 1011)  acetaminophen (TYLENOL) tablet 1,000 mg (1,000 mg Oral Given 02/26/17 1405)     Initial Impression / Assessment and Plan / ED Course  I have reviewed the triage vital signs and the nursing notes.  Pertinent labs & imaging results that were available during my care of the patient were reviewed by me and considered in my medical decision making (see chart for details).    72 year old female with a history of diabetes, hypertension, hyperlipidemia, presents with concern for chest pain and shoulder pain beginning this morning.  EKG without acute findings in comparison to prior.  Chest x-ray shows no sign of pneumonia, pneumothorax, or other acute pathology.  Patient denies dyspnea, has no hypoxia, tachycardia, no asymmetric leg pain or swelling, no recent travel or immobilization, no history of PE or DVT, and have low suspicion for this.  She has symmetric pulses in the upper and lower extremities, and do not feel history or physical exam is consistent with dissection.  She does have significant pain with shoulder abduction.  Given significant musculoskeletal component of her pain, suspect likely muscular strain as etiology.  Patient had similar presentation approximately 1 year ago.  Troponins were completed x2 and were both 0.00.  Recommend ibuprofen, Tylenol and follow-up with her primary care physician within 1 week.  Discussed reasons to return to the emergency department in detail. Patient discharged in stable condition with understanding of reasons to return.   Final Clinical Impressions(s) / ED Diagnoses   Final diagnoses:  Nonspecific chest pain  Acute pain of left shoulder    ED Discharge Orders    None       Gareth Morgan,  MD 02/26/17 2126

## 2017-02-26 NOTE — ED Notes (Signed)
Went over discharge with patient and she has called her daughter to bring her clothes pt states she came by ems and only has a shirt on and doesn't want to wait in lobby for daughter in pjs or scrubs.

## 2017-03-17 ENCOUNTER — Other Ambulatory Visit (INDEPENDENT_AMBULATORY_CARE_PROVIDER_SITE_OTHER): Payer: Self-pay

## 2017-03-17 ENCOUNTER — Ambulatory Visit (INDEPENDENT_AMBULATORY_CARE_PROVIDER_SITE_OTHER): Payer: Medicare Other | Admitting: Gastroenterology

## 2017-03-17 ENCOUNTER — Encounter: Payer: Self-pay | Admitting: Gastroenterology

## 2017-03-17 VITALS — BP 136/74 | HR 84 | Ht 65.25 in | Wt 175.4 lb

## 2017-03-17 DIAGNOSIS — R195 Other fecal abnormalities: Secondary | ICD-10-CM | POA: Diagnosis not present

## 2017-03-17 LAB — CBC WITH DIFFERENTIAL/PLATELET
Basophils Absolute: 0 10*3/uL (ref 0.0–0.1)
Basophils Relative: 0.3 % (ref 0.0–3.0)
Eosinophils Absolute: 0.5 10*3/uL (ref 0.0–0.7)
Eosinophils Relative: 6.6 % — ABNORMAL HIGH (ref 0.0–5.0)
HCT: 37.3 % (ref 36.0–46.0)
Hemoglobin: 12.8 g/dL (ref 12.0–15.0)
Lymphocytes Relative: 28.6 % (ref 12.0–46.0)
Lymphs Abs: 2.2 10*3/uL (ref 0.7–4.0)
MCHC: 34.3 g/dL (ref 30.0–36.0)
MCV: 86.3 fl (ref 78.0–100.0)
Monocytes Absolute: 0.4 10*3/uL (ref 0.1–1.0)
Monocytes Relative: 5.4 % (ref 3.0–12.0)
Neutro Abs: 4.5 10*3/uL (ref 1.4–7.7)
Neutrophils Relative %: 59.1 % (ref 43.0–77.0)
Platelets: 189 10*3/uL (ref 150.0–400.0)
RBC: 4.32 Mil/uL (ref 3.87–5.11)
RDW: 13.7 % (ref 11.5–15.5)
WBC: 7.6 10*3/uL (ref 4.0–10.5)

## 2017-03-17 NOTE — Progress Notes (Signed)
03/17/2017 [name] [id number] 1945/07/17   HISTORY OF PRESENT ILLNESS:  This sis a 72 year old white female who is a patient of Dr. Lynne Leader.  She is here today with her daughter on speaker phone for complaints of dark black stool that has been present for several weeks.  Says that she has a BM once or twice daily.  Not tarry stools.  No abdominal pain, nausea, or vomiting.  Uses Aleve a few times per week for headaches.  Is on an iron supplement daily.  Hgb on 2/20 was 11.8 grams.  Colonoscopy is up-to-date with recall entered for 2021.  EGD in 08/2015 was normal.  She is on pantoprazole 40 mg BID with good reflux control.   Past Medical History:  Diagnosis Date  . Adenomatous colon polyp 03/1988  . Anemia    borderline  . Arthritis    R shoulder - degenerative   . Cataract    beginning stages  . Chest pain     Sept 2011: stress test neg  . Depression    sees Dr.Cotle  . Diabetes mellitus    dr Buddy Duty  . Diverticulosis   . Fatty liver    Increased LFTs, saw GI 06/2011, likely from fatty liver   . GERD (gastroesophageal reflux disease)   . History of hiatal hernia   . Hyperlipemia   . Hypertension   . IBS (irritable bowel syndrome)    and dyspepsia  . Memory impairment 08/2014   mild cognitive impairment vs. mild dementia, reommended reinstating of cholinesterase inhibitor   . Osteopenia    dexa 06/2007 and 10/11 Rx CA vitamin d  . RLS (restless legs syndrome)    h/o- no longer using Requip   Past Surgical History:  Procedure Laterality Date  . BREAST BIOPSY Left 10/11/2015   fibroadenoma w/ calcifications, no evidence of malignancy, recommend mammogram repeat-6 mnths  . POLYPECTOMY    . REVERSE SHOULDER ARTHROPLASTY Right 04/21/2014   Procedure: REVERSE SHOULDER ARTHROPLASTY;  Surgeon: Tania Ade, MD;  Location: [facility];  Service: Orthopedics;  Laterality: Right;  Right reverse total shoulder replacement  . TONSILLECTOMY    . UTERINE FIBROID EMBOLIZATION     90s    reports that she quit smoking about 41 years ago. Her smoking use included cigarettes. She has a 20.00 pack-year smoking history. she has never used smokeless tobacco. She reports that she drinks alcohol. She reports that she does not use drugs. family history includes Colon cancer in her father; Diabetes in her other; Heart attack in her maternal grandmother; Lung cancer in her mother; Other in her other; Ovarian cancer in her paternal aunt; Stroke in her other. Allergies  Allergen Reactions  . Sulfa Antibiotics Nausea Only  . Trulicity [Dulaglutide] Nausea Only  . Penicillins Rash and Other (See Comments)    Has patient had a PCN reaction causing immediate rash, facial/tongue/throat swelling, SOB or lightheadedness with hypotension: No Has patient had a PCN reaction causing severe rash involving mucus membranes or skin necrosis: No Has patient had a PCN reaction that required hospitalization No Has patient had a PCN reaction occurring within the last 10 years: No If all of the above answers are "NO", then may proceed with Cephalosporin use.      Outpatient Encounter Medications as of 03/17/2017  Medication Sig  . acetaminophen (TYLENOL) 325 MG tablet Take 650 mg by mouth every 6 (six) hours as needed for mild pain or headache.  Marland Kitchen acetaminophen-codeine (TYLENOL #  3) 300-30 MG tablet Take 1 tablet by mouth every 6 (six) hours as needed. for pain  . aspirin (ASPIR-LOW) 81 MG EC tablet Take 1 tablet (81 mg total) by mouth daily.  Marland Kitchen atenolol (TENORMIN) 50 MG tablet Take 0.5 tablets (25 mg total) by mouth daily.  Marland Kitchen atorvastatin (LIPITOR) 40 MG tablet Take 1 tablet (40 mg total) by mouth daily.  Marland Kitchen darifenacin (ENABLEX) 15 MG 24 hr tablet Take 1 tablet (15 mg total) by mouth daily.  Marland Kitchen donepezil (ARICEPT) 10 MG tablet Take 2 tablets (20 mg total) by mouth at bedtime.  . Ferrous Fumarate-Vitamin C ER (FERRO-SEQUELS) 65-25 MG TBCR Take 1 tablet by mouth daily.  . Insulin Detemir (LEVEMIR  FLEXTOUCH) 100 UNIT/ML Pen Inject 52 Units into the skin at bedtime.   Marland Kitchen losartan (COZAAR) 25 MG tablet Take 1 tablet (25 mg total) by mouth every evening.  . memantine (NAMENDA) 10 MG tablet Take 10 mg by mouth 2 (two) times daily.  . metFORMIN (GLUCOPHAGE) 1000 MG tablet Take 1,000 mg by mouth 2 (two) times daily.  . Multiple Vitamins-Minerals (CENTRUM SILVER 50+WOMEN) TABS Take 1 tablet by mouth daily.  Marland Kitchen MYRBETRIQ 50 MG TB24 tablet Take 1 tablet by mouth daily.  . naproxen sodium (ALEVE) 220 MG tablet Take 220-440 mg by mouth every 4 (four) hours as needed (for pain).  . pantoprazole (PROTONIX) 40 MG tablet TAKE 1 TABLET BY MOUTH 2 TIMES DAILY BEFORE A MEAL  . sertraline (ZOLOFT) 50 MG tablet Take 1 tablet by mouth daily.  . sodium fluoride (PREVIDENT 5000 DRY MOUTH) 1.1 % GEL dental gel Place 1 application onto teeth at bedtime as needed (for teeth).   Marland Kitchen tiZANidine (ZANAFLEX) 2 MG tablet Take 2-4 mg by mouth 2 (two) times daily as needed.  . vitamin B-12 (CYANOCOBALAMIN) 1000 MCG tablet Take 1 tablet (1,000 mcg total) by mouth daily.  Marland Kitchen vortioxetine HBr (TRINTELLIX) 20 MG TABS tablet Take 10 mg by mouth daily.    Facility-Administered Encounter Medications as of 03/17/2017  Medication  . 0.9 %  sodium chloride infusion     REVIEW OF SYSTEMS  : All other systems reviewed and negative except where noted in the History of Present Illness.   PHYSICAL EXAM: BP 136/74   Pulse 84   Ht 5' 5.25" (1.657 m)   Wt 175 lb 6.4 oz (79.6 kg)   BMI 28.96 kg/m  General: Well developed white female in no acute distress Head: Normocephalic and atraumatic Eyes:  Sclerae anicteric, conjunctiva pink. Ears: Normal auditory acuity Lungs: Clear throughout to auscultation; no increased WOB. Heart: Regular rate and rhythm; no M/R/G. Abdomen: Soft, nontender, non distended. No masses or hepatomegaly noted. Normal bowel sounds Rectal:  No external abnormalities noted.  Decreased sphincter tone on DRE.  No  masses noted.  Small amount of dark colored stool on exam glove was hemoccult negative. Musculoskeletal: Symmetrical with no gross deformities  Skin: No lesions on visible extremities Extremities: No edema  Neurological: Alert oriented x 4, grossly non-focal Psychological:  Alert and cooperative. Normal mood and affect  ASSESSMENT AND PLAN: *Dark stool:  Present for several weeks.  Dark stool on exam today was hemoccult negative.  Likely due to iron and/or dietary related.  Will check CBC today as well to be sure that Hgb is stable and if so then will just monitor for now.   CC:  Colon Branch, MD

## 2017-03-17 NOTE — Patient Instructions (Signed)
Please go to the basement level to have your labs drawn.   If you are age 72 or older, your body mass index should be between 23-30. Your Body mass index is 28.96 kg/m. If this is out of the aforementioned range listed, please consider follow up with your Primary Care Provider.

## 2017-03-22 NOTE — Progress Notes (Signed)
Reviewed and agree with initial management plan.  Tvisha Schwoerer T. Maurio Baize, MD FACG 

## 2017-03-28 ENCOUNTER — Other Ambulatory Visit: Payer: Self-pay | Admitting: Internal Medicine

## 2017-03-28 DIAGNOSIS — E042 Nontoxic multinodular goiter: Secondary | ICD-10-CM

## 2017-04-03 ENCOUNTER — Ambulatory Visit
Admission: RE | Admit: 2017-04-03 | Discharge: 2017-04-03 | Disposition: A | Discharge status: Home / Self Care | Payer: Medicare Other | Source: Ambulatory Visit | Attending: Internal Medicine | Admitting: Internal Medicine

## 2017-04-03 DIAGNOSIS — E042 Nontoxic multinodular goiter: Secondary | ICD-10-CM

## 2017-05-12 ENCOUNTER — Other Ambulatory Visit: Payer: Self-pay | Admitting: Internal Medicine

## 2017-05-12 DIAGNOSIS — M25472 Effusion, left ankle: Secondary | ICD-10-CM

## 2017-05-12 LAB — MICROALBUMIN, URINE: Microalb, Ur: <0.7

## 2017-05-12 LAB — HEMOGLOBIN A1C: Hemoglobin A1C: 6.5

## 2017-05-12 LAB — HM DIABETES FOOT EXAM

## 2017-05-16 ENCOUNTER — Ambulatory Visit
Admission: RE | Admit: 2017-05-16 | Discharge: 2017-05-16 | Disposition: A | Discharge status: Home / Self Care | Payer: Medicare Other | Source: Ambulatory Visit | Attending: Internal Medicine | Admitting: Internal Medicine

## 2017-05-16 DIAGNOSIS — M25472 Effusion, left ankle: Secondary | ICD-10-CM

## 2017-05-27 ENCOUNTER — Ambulatory Visit (INDEPENDENT_AMBULATORY_CARE_PROVIDER_SITE_OTHER): Payer: Medicare Other | Admitting: Podiatry

## 2017-05-27 ENCOUNTER — Other Ambulatory Visit: Payer: Self-pay

## 2017-05-27 ENCOUNTER — Encounter: Payer: Self-pay | Admitting: Podiatry

## 2017-05-27 DIAGNOSIS — M79676 Pain in unspecified toe(s): Secondary | ICD-10-CM

## 2017-05-27 DIAGNOSIS — B351 Tinea unguium: Secondary | ICD-10-CM

## 2017-05-27 NOTE — Progress Notes (Signed)
Presents today chief complaint of painful elongated toenails 1 through 5 bilateral.  Objective: Has a long thick yellow dystrophic-like mycotic painful palpation as well as debridement.  Assessment: Pain in limb secondary to onychomycosis.  Plan: Debridement of toenails 1 through 5 bilateral.

## 2017-06-04 ENCOUNTER — Telehealth: Payer: Self-pay | Admitting: Internal Medicine

## 2017-06-04 MED ORDER — SERTRALINE HCL 50 MG PO TABS: 50.0000 mg | ORAL_TABLET | Freq: Every day | ORAL | 0 refills | Status: DC

## 2017-06-04 NOTE — Telephone Encounter (Signed)
Rx sent 

## 2017-06-04 NOTE — Telephone Encounter (Signed)
Please advise 

## 2017-06-04 NOTE — Telephone Encounter (Signed)
Copied from [place] [id number]. Topic: Quick Communication - Rx Refill/Question >> Jun 04, 2017 10:27 AM Boyd Kerbs wrote: Medication:  sertraline (ZOLOFT) 50 MG tablet  Pt. Is asking if Dr. Larose Kells can prescribe this for her.  She says she can not get ahold of the other office to get prescription.  Pt. Is out of medication.  She has had this problem before and wants Dr. Larose Kells to start taking care of this.   She did make appt for 5/30  Has the patient contacted their pharmacy? No. (Agent: If no, request that the patient contact the pharmacy for the refill.) (Agent: If yes, when and what did the pharmacy advise?)  Preferred Pharmacy (with phone number or street name):   [address], [place] - [place], [place] - [address] [place] [address] Phone: [phone] Fax: [phone]    Agent: Please be advised that RX refills may take up to 3 business days. We ask that you follow-up with your pharmacy.

## 2017-06-04 NOTE — Telephone Encounter (Signed)
Call in a month supply of Zoloft, will discuss more on RTC

## 2017-06-04 NOTE — Telephone Encounter (Signed)
Refill request Zoloft 50 mg   LOV 01-14-2017  Dr Larose Kells   Last filled 03/17/2017  Historical provider  Note-  Patient has an appointment 06/05/2017  With Dr Larose Kells   Pharmacy - [facility]  [id number]

## 2017-06-05 ENCOUNTER — Encounter: Payer: Self-pay | Admitting: Internal Medicine

## 2017-06-05 ENCOUNTER — Ambulatory Visit (INDEPENDENT_AMBULATORY_CARE_PROVIDER_SITE_OTHER): Payer: Medicare Other | Admitting: Internal Medicine

## 2017-06-05 VITALS — BP 118/68 | HR 62 | Temp 98.2°F | Resp 14 | Ht 65.25 in | Wt 177.1 lb

## 2017-06-05 DIAGNOSIS — I10 Essential (primary) hypertension: Secondary | ICD-10-CM

## 2017-06-05 DIAGNOSIS — E222 Syndrome of inappropriate secretion of antidiuretic hormone: Secondary | ICD-10-CM

## 2017-06-05 DIAGNOSIS — E538 Deficiency of other specified B group vitamins: Secondary | ICD-10-CM

## 2017-06-05 LAB — BASIC METABOLIC PANEL
BUN: 8 mg/dL (ref 6–23)
CO2: 25 mEq/L (ref 19–32)
Calcium: 9.7 mg/dL (ref 8.4–10.5)
Chloride: 99 mEq/L (ref 96–112)
Creatinine, Ser: 0.66 mg/dL (ref 0.40–1.20)
GFR: 93.49 mL/min (ref >60.00)
Glucose, Bld: 79 mg/dL (ref 70–99)
Potassium: 4.3 mEq/L (ref 3.5–5.1)
Sodium: 135 mEq/L (ref 135–145)

## 2017-06-05 NOTE — Patient Instructions (Signed)
GO TO THE LAB : Get the blood work     GO TO THE FRONT DESK Schedule your next appointment for a physical exam for January 2020   Come back sooner or call if needed.

## 2017-06-05 NOTE — Progress Notes (Signed)
Subjective:    Patient ID: [name], female    DOB: 01-Jun-1945, 72 y.o.   MRN: [id number]  DOS:  06/05/2017 Type of visit - description : rov, her daughter [name] joined [place]  by phone Interval history: Feeling well. Depression is currently well controlled. RLS medication?  She has some nocturnal leg movements but is not affecting her sleep quality. HTN: Good compliance with medications She sees endocrinology regularly Chronic cough: Basically unchanged  Review of Systems  Denies nausea at this point  Past Medical History:  Diagnosis Date  . Adenomatous colon polyp 03/1988  . Anemia    borderline  . Arthritis    R shoulder - degenerative   . Cataract    beginning stages  . Chest pain     Sept 2011: stress test neg  . Depression    sees Dr.Cotle  . Diabetes mellitus    dr Buddy Duty  . Diverticulosis   . Fatty liver    Increased LFTs, saw GI 06/2011, likely from fatty liver   . GERD (gastroesophageal reflux disease)   . History of hiatal hernia   . Hyperlipemia   . Hypertension   . IBS (irritable bowel syndrome)    and dyspepsia  . Memory impairment 08/2014   mild cognitive impairment vs. mild dementia, reommended reinstating of cholinesterase inhibitor   . Osteopenia    dexa 06/2007 and 10/11 Rx CA vitamin d  . RLS (restless legs syndrome)    h/o- no longer using Requip    Past Surgical History:  Procedure Laterality Date  . BREAST BIOPSY Left 10/11/2015   fibroadenoma w/ calcifications, no evidence of malignancy, recommend mammogram repeat-6 mnths  . POLYPECTOMY    . REVERSE SHOULDER ARTHROPLASTY Right 04/21/2014   Procedure: REVERSE SHOULDER ARTHROPLASTY;  Surgeon: Tania Ade, MD;  Location: [facility];  Service: Orthopedics;  Laterality: Right;  Right reverse total shoulder replacement  . TONSILLECTOMY    . UTERINE FIBROID EMBOLIZATION     90s    Social History   Socioeconomic History  . Marital status: Widowed    Spouse name: [name]  . Number of  children: 3  . Years of education: Not on file  . Highest education level: Not on file  Occupational History  . Occupation: retired, was a Leisure centre manager)    Comment: RN  Social Needs  . Financial resource strain: Not on file  . Food insecurity:    Worry: Not on file    Inability: Not on file  . Transportation needs:    Medical: Not on file    Non-medical: Not on file  Tobacco Use  . Smoking status: Former Smoker    Packs/day: 2.00    Years: 10.00    Pack years: 20.00    Types: Cigarettes    Last attempt to quit: 11/26/1975    Years since quitting: 41.5  . Smokeless tobacco: Never Used  . Tobacco comment: used to smoke 2 ppd  Substance and Sexual Activity  . Alcohol use: Yes    Alcohol/week: 0.0 oz    Comment: rarely  . Drug use: No  . Sexual activity: Not Currently  Lifestyle  . Physical activity:    Days per week: Not on file    Minutes per session: Not on file  . Stress: Not on file  Relationships  . Social connections:    Talks on phone: Not on file    Gets together: Not on file    Attends religious service:  Not on file    Active member of club or organization: Not on file    Attends meetings of clubs or organizations: Not on file    Relationship status: Not on file  . Intimate partner violence:    Fear of current or ex partner: Not on file    Emotionally abused: Not on file    Physically abused: Not on file    Forced sexual activity: Not on file  Other Topics Concern  . Not on file  Social History Narrative   Widow , lives by herself at [facility] in a 1 bedroom apt;  still drives, daughters [name]) live  in [place] in [place]     5 g-children      Allergies as of 06/05/2017      Reactions   Sulfa Antibiotics Nausea Only   Trulicity [dulaglutide] Nausea Only   Penicillins Rash, Other (See Comments)   Has patient had a PCN reaction causing immediate rash, facial/tongue/throat swelling, SOB or lightheadedness with hypotension:  No Has patient had a PCN reaction causing severe rash involving mucus membranes or skin necrosis: No Has patient had a PCN reaction that required hospitalization No Has patient had a PCN reaction occurring within the last 10 years: No If all of the above answers are "NO", then may proceed with Cephalosporin use.      Medication List        Accurate as of 06/05/17 11:59 PM. Always use your most recent med list.          ACCU-CHEK FASTCLIX LANCETS Misc daily. use as directed   acetaminophen 325 MG tablet Commonly known as:  TYLENOL Take 650 mg by mouth every 6 (six) hours as needed for mild pain or headache.   acetaminophen-codeine 300-30 MG tablet Commonly known as:  TYLENOL #3 Take 1 tablet by mouth every 6 (six) hours as needed. for pain   ARICEPT 10 MG tablet Generic drug:  donepezil Take 2 tablets (20 mg total) by mouth at bedtime.   aspirin 81 MG EC tablet Commonly known as:  ASPIR-LOW Take 1 tablet (81 mg total) by mouth daily.   atenolol 50 MG tablet Commonly known as:  TENORMIN Take 0.5 tablets (25 mg total) by mouth daily.   atorvastatin 40 MG tablet Commonly known as:  LIPITOR Take 1 tablet (40 mg total) by mouth daily.   BD VEO INSULIN SYRINGE U/F 31G X 15/64" 1 ML Misc Generic drug:  Insulin Syringe-Needle U-100 USE TO INJECT insulin EVERY DAY   CENTRUM SILVER 50+WOMEN Tabs Take 1 tablet by mouth daily.   darifenacin 15 MG 24 hr tablet Commonly known as:  ENABLEX Take 1 tablet (15 mg total) by mouth daily.   Ferrous Fumarate-Vitamin C ER 65-25 MG Tbcr Commonly known as:  FERRO-SEQUELS Take 1 tablet by mouth daily.   LEVEMIR FLEXTOUCH 100 UNIT/ML Pen Generic drug:  Insulin Detemir Inject 52 Units into the skin at bedtime.   losartan 25 MG tablet Commonly known as:  COZAAR Take 1 tablet (25 mg total) by mouth every evening.   memantine 10 MG tablet Commonly known as:  NAMENDA Take 10 mg by mouth 2 (two) times daily.   metFORMIN 1000 MG  tablet Commonly known as:  GLUCOPHAGE Take 1,000 mg by mouth 2 (two) times daily.   naproxen sodium 220 MG tablet Commonly known as:  ALEVE Take 220-440 mg by mouth every 4 (four) hours as needed (for pain).   pantoprazole 40 MG  tablet Commonly known as:  PROTONIX TAKE 1 TABLET BY MOUTH 2 TIMES DAILY BEFORE A MEAL   PREVIDENT 5000 DRY MOUTH 1.1 % Gel dental gel Generic drug:  sodium fluoride Place 1 application onto teeth at bedtime as needed (for teeth).   sertraline 50 MG tablet Commonly known as:  ZOLOFT Take 1 tablet (50 mg total) by mouth daily.   tiZANidine 2 MG tablet Commonly known as:  ZANAFLEX Take 2-4 mg by mouth 2 (two) times daily as needed.   vitamin B-12 1000 MCG tablet Commonly known as:  CYANOCOBALAMIN Take 1 tablet (1,000 mcg total) by mouth daily.          Objective:   Physical Exam BP 118/68 (BP Location: Left Arm, Patient Position: Sitting, Cuff Size: Small)   Pulse 62   Temp 98.2 F (36.8 C) (Oral)   Resp 14   Ht 5' 5.25" (1.657 m)   Wt 177 lb 2 oz (80.3 kg)   SpO2 97%   BMI 29.25 kg/m  General:   Well developed, well nourished . NAD.  HEENT:  Normocephalic . Face symmetric, atraumatic Lungs:  CTA B Normal respiratory effort, no intercostal retractions, no accessory muscle use. Heart: RRR,  no murmur.  No pretibial edema bilaterally  Skin: Not pale. Not jaundice Neurologic:  alert & oriented X3.  Speech normal, gait appropriate for age and unassisted Psych--  Cooperative with normal attention span and concentration.  Behavior appropriate. No anxious or depressed appearing.      Assessment & Plan:    Assessment  DM Dr. Buddy Duty HTN Hyperlipidemia Depression used to see Dr. Clovis Pu OSA -- mild, also told RLS (Rx requip); saw Dr Gwenette Greet 2014, no CPAP GI: --IBS, colon polyps, diverticulosis, hiatal hernia --Chronic constipation likely part of her IBS syndrome  --Fatty liver >>> GI eval 2013 --iron fec anemia:  cscope 04-2014 : 2  polyps; + hemocult @ GI office 10-2014: EGD done (-), bx neg Osteopenia: T score -1.1  2009 , osteopenia again per dexa 05-2013 , rx ca-vit d B12 deficiency  RLS MSK: --DJD frozen shoulder Mild cognitive impairment  MMSE 2015 --> 28, on Aricept, namenda added 04-2016 sees Dr Everette Rank  Dizziness: chronic, carotid US 2016 neg, saw cards-- not likely CV related; saw neuro DR Everette Rank 02-2015  Chest pain 09-2009, negative stress test Thyroid nodules: Incidental   by Carotid US, BX 11-2014: Atypical findings, [facility] III, f/u  Endocrine RLL Lung nodule: Stable per CT 09-2016, no need for further  PLAN: DM, thyroid disease: Sees endocrinology HTN: Well-controlled SIADH: Check a BMP Depression: Currently well controlled on Zoloft.  Sees a nurse practitioner at the [facility] group. Cognitive impairment: Last visit with neurology 05/19/2017, they noted a objective decline.  Probably Alzheimer.  Was given general recommendations and continue with namenda- Aricept.  Limiting driving. RLS, wonders if meds are  indicated, she is however somewhat afraid of taking too many medications.  Symptoms are mild and not bothersome, recommend no specific treatment. B12 deficiency: Last level satisfactory, continue supplements Dark stools: Saw GI 03-2017, felt to be due to iron supplements, CBC normal RTC, CPX 01-2018

## 2017-06-05 NOTE — Progress Notes (Signed)
Pre visit review using our clinic review tool, if applicable. No additional management support is needed unless otherwise documented below in the visit note. 

## 2017-06-06 ENCOUNTER — Encounter: Payer: Self-pay | Admitting: Internal Medicine

## 2017-06-06 NOTE — Assessment & Plan Note (Signed)
DM, thyroid disease: Sees endocrinology HTN: Well-controlled SIADH: Check a BMP Depression: Currently well controlled on Zoloft.  Sees a nurse practitioner at the [facility] psychiatry group. Cognitive impairment: Last visit with neurology 05/19/2017, they noted a objective decline.  Probably Alzheimer.  Was given general recommendations and continue with namenda- Aricept.  Limiting driving. RLS, wonders if meds are  indicated, she is however somewhat afraid of taking too many medications.  Symptoms are mild and not bothersome, recommend no specific treatment. B12 deficiency: Last level satisfactory, continue supplements Dark stools: Saw GI 03-2017, felt to be due to iron supplements, CBC normal RTC, CPX 01-2018

## 2017-06-17 ENCOUNTER — Ambulatory Visit: Payer: Self-pay | Admitting: Internal Medicine

## 2017-06-18 ENCOUNTER — Ambulatory Visit: Payer: Self-pay | Admitting: Internal Medicine

## 2017-06-20 ENCOUNTER — Encounter: Payer: Self-pay | Admitting: Internal Medicine

## 2017-06-20 ENCOUNTER — Ambulatory Visit (INDEPENDENT_AMBULATORY_CARE_PROVIDER_SITE_OTHER): Payer: Medicare Other | Admitting: Internal Medicine

## 2017-06-20 VITALS — BP 126/78 | HR 59 | Temp 98.2°F | Resp 16 | Ht 65.25 in | Wt 176.5 lb

## 2017-06-20 DIAGNOSIS — R51 Headache: Secondary | ICD-10-CM | POA: Diagnosis not present

## 2017-06-20 DIAGNOSIS — R251 Tremor, unspecified: Secondary | ICD-10-CM

## 2017-06-20 DIAGNOSIS — H9202 Otalgia, left ear: Secondary | ICD-10-CM | POA: Diagnosis not present

## 2017-06-20 DIAGNOSIS — R399 Unspecified symptoms and signs involving the genitourinary system: Secondary | ICD-10-CM | POA: Diagnosis not present

## 2017-06-20 DIAGNOSIS — R519 Headache, unspecified: Secondary | ICD-10-CM

## 2017-06-20 LAB — URINALYSIS, ROUTINE W REFLEX MICROSCOPIC
Bilirubin Urine: NEGATIVE
Ketones, ur: NEGATIVE
Nitrite: POSITIVE — AB
Specific Gravity, Urine: <=1.005 — AB (ref 1.000–1.030)
Total Protein, Urine: NEGATIVE
Urine Glucose: NEGATIVE
Urobilinogen, UA: 0.2 (ref 0.0–1.0)
pH: 6 (ref 5.0–8.0)

## 2017-06-20 LAB — POC URINALSYSI DIPSTICK (AUTOMATED)
Bilirubin, UA: NEGATIVE
Glucose, UA: NEGATIVE
Ketones, UA: NEGATIVE
Nitrite, UA: POSITIVE
Protein, UA: POSITIVE — AB
Spec Grav, UA: 1.015 (ref 1.010–1.025)
Urobilinogen, UA: 0.2 E.U./dL
pH, UA: 6 (ref 5.0–8.0)

## 2017-06-20 NOTE — Progress Notes (Signed)
Pre visit review using our clinic review tool, if applicable. No additional management support is needed unless otherwise documented below in the visit note. 

## 2017-06-20 NOTE — Assessment & Plan Note (Addendum)
L Otalgia, ill-defined facial discomfort: Sxs are somewhat vague, history somewhat limited by patient's dementia.  The family is concerned about the left jaw discomfort being related to her heart but this is unlikely on clinical grounds. DDX includes simply a URI but also TMJ, neuralgia, early herpes, T.A.;  less likely an acute neurological event. At this point recommend close observation, see instructions. Tremors, RLS: Wonders if should restart Requip, recommend to discuss with neurology. Also, at the end of the visit she reported some dysuria in the last few days.  Reports  no fever, chills, gross hematuria, nausea or vomiting.Marland Kitchen Udip +, will wait for the UA and urine culture before we treat with antibiotics.

## 2017-06-20 NOTE — Progress Notes (Signed)
Subjective:    Patient ID: [name], female    DOB: 08/19/1945, 72 y.o.   MRN: [id number]  DOS:  06/20/2017 Type of visit - description : acute , her w/ her sister [name]") who supplements the history.  She is visiting from [place]. Interval history: Symptoms started approximately 3 to 4 days ago with a mild sore throat and left ear ache on and off. The left ear pain is now more steady.  The sore throat is resolved She also has ill-defined feeling at the jaw and the left cheek.  Pain is somewhat better when she puts pressure by the jaw angle. Her sister is worried about this being a heart problem. Yesterday she took a couple of leftover Tylenol #3 w/ partial relieve .   Review of Systems No fever chills.  No rash Mild cough due to "clearing the throat". No substernal chest pain, no difficulty breathing or edema. No diplopia, slurred speech, droopy face, focal deficits.  She does have a chronic left foot drop at baseline. Memory at baseline although the sister reports that she is lately more repetitive.  Also, tremor is slightly more noticeable.  Past Medical History:  Diagnosis Date  . Adenomatous colon polyp 03/1988  . Anemia    borderline  . Arthritis    R shoulder - degenerative   . Cataract    beginning stages  . Chest pain     Sept 2011: stress test neg  . Depression    sees Dr.Cotle  . Diabetes mellitus    dr [name]  . Diverticulosis   . Fatty liver    Increased LFTs, saw GI 06/2011, likely from fatty liver   . GERD (gastroesophageal reflux disease)   . History of hiatal hernia   . Hyperlipemia   . Hypertension   . IBS (irritable bowel syndrome)    and dyspepsia  . Memory impairment 08/2014   mild cognitive impairment vs. mild dementia, reommended reinstating of cholinesterase inhibitor   . Osteopenia    dexa 06/2007 and 10/11 Rx CA vitamin d  . RLS (restless legs syndrome)    h/o- no longer using Requip    Past Surgical History:  Procedure  Laterality Date  . BREAST BIOPSY Left 10/11/2015   fibroadenoma w/ calcifications, no evidence of malignancy, recommend mammogram repeat-6 mnths  . POLYPECTOMY    . REVERSE SHOULDER ARTHROPLASTY Right 04/21/2014   Procedure: REVERSE SHOULDER ARTHROPLASTY;  Surgeon: Lindsay Ade, MD;  Location: [facility];  Service: Orthopedics;  Laterality: Right;  Right reverse total shoulder replacement  . TONSILLECTOMY    . UTERINE FIBROID EMBOLIZATION     90s    Social History   Socioeconomic History  . Marital status: Widowed    Spouse name: [name]  . Number of children: 3  . Years of education: Not on file  . Highest education level: Not on file  Occupational History  . Occupation: retired, was a Leisure centre manager)    Comment: RN  Social Santiago  . Financial resource strain: Not on file  . Food insecurity:    Worry: Not on file    Inability: Not on file  . Transportation Santiago:    Medical: Not on file    Non-medical: Not on file  Tobacco Use  . Smoking status: Former Smoker    Packs/day: 2.00    Years: 10.00    Pack years: 20.00    Types: Cigarettes    Last attempt to quit: 11/26/1975  Years since quitting: 41.5  . Smokeless tobacco: Never Used  . Tobacco comment: used to smoke 2 ppd  Substance and Sexual Activity  . Alcohol use: Yes    Alcohol/week: 0.0 oz    Comment: rarely  . Drug use: No  . Sexual activity: Not Currently  Lifestyle  . Physical activity:    Days per week: Not on file    Minutes per session: Not on file  . Stress: Not on file  Relationships  . Social connections:    Talks on phone: Not on file    Gets together: Not on file    Attends religious service: Not on file    Active member of club or organization: Not on file    Attends meetings of clubs or organizations: Not on file    Relationship status: Not on file  . Intimate partner violence:    Fear of current or ex partner: Not on file    Emotionally abused: Not on file    Physically abused: Not  on file    Forced sexual activity: Not on file  Other Topics Concern  . Not on file  Social History Narrative   Widow , lives by herself at [facility] in a 1 bedroom apt;  still drives, daughters [name]) live  in [place] in [place]     5 g-children      Allergies as of 06/20/2017      Reactions   Sulfa Antibiotics Nausea Only   Trulicity [dulaglutide] Nausea Only   Penicillins Rash, Other (See Comments)   Has patient had a PCN reaction causing immediate rash, facial/tongue/throat swelling, SOB or lightheadedness with hypotension: No Has patient had a PCN reaction causing severe rash involving mucus membranes or skin necrosis: No Has patient had a PCN reaction that required hospitalization No Has patient had a PCN reaction occurring within the last 10 years: No If all of the above answers are "NO", then may proceed with Cephalosporin use.      Medication List        Accurate as of 06/20/17  3:02 PM. Always use your most recent med list.          ACCU-CHEK FASTCLIX LANCETS Misc daily. use as directed   acetaminophen 325 MG tablet Commonly known as:  TYLENOL Take 650 mg by mouth every 6 (six) hours as needed for mild pain or headache.   acetaminophen-codeine 300-30 MG tablet Commonly known as:  TYLENOL #3 Take 1 tablet by mouth every 6 (six) hours as needed. for pain   ARICEPT 10 MG tablet Generic drug:  donepezil Take 2 tablets (20 mg total) by mouth at bedtime.   aspirin 81 MG EC tablet Commonly known as:  ASPIR-LOW Take 1 tablet (81 mg total) by mouth daily.   atenolol 50 MG tablet Commonly known as:  TENORMIN Take 0.5 tablets (25 mg total) by mouth daily.   atorvastatin 40 MG tablet Commonly known as:  LIPITOR Take 1 tablet (40 mg total) by mouth daily.   BD VEO INSULIN SYRINGE U/F 31G X 15/64" 1 ML Misc Generic drug:  Insulin Syringe-Needle U-100 USE TO INJECT insulin EVERY DAY   CENTRUM SILVER 50+WOMEN Tabs Take 1 tablet by mouth daily.     darifenacin 15 MG 24 hr tablet Commonly known as:  ENABLEX Take 1 tablet (15 mg total) by mouth daily.   Ferrous Fumarate-Vitamin C ER 65-25 MG Tbcr Commonly known as:  FERRO-SEQUELS Take 1 tablet by mouth  daily.   LEVEMIR FLEXTOUCH 100 UNIT/ML Pen Generic drug:  Insulin Detemir Inject 52 Units into the skin at bedtime.   losartan 25 MG tablet Commonly known as:  COZAAR Take 1 tablet (25 mg total) by mouth every evening.   memantine 10 MG tablet Commonly known as:  NAMENDA Take 10 mg by mouth 2 (two) times daily.   metFORMIN 1000 MG tablet Commonly known as:  GLUCOPHAGE Take 1,000 mg by mouth 2 (two) times daily.   naproxen sodium 220 MG tablet Commonly known as:  ALEVE Take 220-440 mg by mouth every 4 (four) hours as needed (for pain).   pantoprazole 40 MG tablet Commonly known as:  PROTONIX TAKE 1 TABLET BY MOUTH 2 TIMES DAILY BEFORE A MEAL   PREVIDENT 5000 DRY MOUTH 1.1 % Gel dental gel Generic drug:  sodium fluoride Place 1 application onto teeth at bedtime as needed (for teeth).   sertraline 50 MG tablet Commonly known as:  ZOLOFT Take 1 tablet (50 mg total) by mouth daily.   tiZANidine 2 MG tablet Commonly known as:  ZANAFLEX Take 2-4 mg by mouth 2 (two) times daily as needed.   vitamin B-12 1000 MCG tablet Commonly known as:  CYANOCOBALAMIN Take 1 tablet (1,000 mcg total) by mouth daily.          Objective:   Physical Exam BP 126/78 (BP Location: Left Arm, Patient Position: Sitting, Cuff Size: Small)   Pulse (!) 59   Temp 98.2 F (36.8 C) (Oral)   Resp 16   Ht 5' 5.25" (1.657 m)   Wt 176 lb 8 oz (80.1 kg)   SpO2 94%   BMI 29.15 kg/m  General:   Well developed, NAD, see BMI.  HEENT:  Normocephalic . Face symmetric, atraumatic.  No rash. TMs: Normal, left side is slightly bulge. Throat: Symmetric, not red, tongue midline. TMJ: No click or pain. Temples: No TTP Lungs:  CTA B Normal respiratory effort, no intercostal retractions, no  accessory muscle use. Heart: RRR,  no murmur.  No pretibial edema bilaterally  Skin: Not pale. Not jaundice Neurologic:  alert & oriented X3.  Speech normal, gait appropriate for age and unassisted.  EOMI, pupils equal and reactive.  No motor deficits. Psych--  Cooperative with normal attention span and concentration.  Behavior appropriate. No anxious or depressed appearing.      Assessment & Plan:   Assessment  DM Dr. Buddy Santiago HTN Hyperlipidemia Depression used to see Dr. Clovis Pu OSA -- mild, also told RLS (Rx requip); saw Dr Gwenette Greet 2014, no CPAP GI: --IBS, colon polyps, diverticulosis, hiatal hernia --Chronic constipation likely part of her IBS syndrome  --Fatty liver >>> GI eval 2013 --iron fec anemia:  cscope 04-2014 : 2 polyps; + hemocult @ GI office 10-2014: EGD done (-), bx neg Osteopenia: T score -1.1  2009 , osteopenia again per dexa 05-2013 , rx ca-vit d B12 deficiency  RLS MSK: --DJD frozen shoulder Mild cognitive impairment  MMSE 2015 --> 28, on Aricept, namenda added 04-2016 sees Dr Everette Rank  Dizziness: chronic, carotid US 2016 neg, saw cards-- not likely CV related; saw neuro DR Everette Rank 02-2015  Chest pain 09-2009, negative stress test Thyroid nodules: Incidental   by Carotid US, BX 11-2014: Atypical findings, [facility] III, f/u  Endocrine RLL Lung nodule: Stable per CT 09-2016, no need for further  PLAN: L Otalgia, ill-defined facial discomfort: Sxs are somewhat vague, history somewhat limited by patient's dementia.  The family is concerned about the left jaw discomfort  being related to her heart but this is unlikely on clinical grounds. DDX includes simply a URI but also TMJ, neuralgia, early herpes, T.A.;  less likely an acute neurological event. At this point recommend close observation, see instructions. Tremors, RLS: Wonders if should restart Requip, recommend to discuss with neurology. Also, at the end of the visit she reported some dysuria in the last few days.   Reports  no fever, chills, gross hematuria, nausea or vomiting.Marland Kitchen Udip +, will wait for the UA and urine culture before we treat with antibiotics.  Today, I spent more than  25  min with the patient and her sister: >50% of the time counseling regards to your otalgia and the facial discomfort, explaining them my reasoning behind the DDX. Multiple questions answered to the best of my ability.

## 2017-06-20 NOTE — Patient Instructions (Addendum)
Rest, fluids, take some Tylenol.  Call if not gradually better  Call or go to the ER if you have:  Fever, chills, a rash, any new neurological symptoms such as double vision, difficulty with your speech or gait, facial drooping

## 2017-06-22 ENCOUNTER — Encounter: Payer: Self-pay | Admitting: Internal Medicine

## 2017-06-22 LAB — URINE CULTURE
MICRO NUMBER:: 90715844
SPECIMEN QUALITY:: ADEQUATE

## 2017-06-23 ENCOUNTER — Other Ambulatory Visit: Payer: Self-pay

## 2017-06-23 ENCOUNTER — Emergency Department (HOSPITAL_BASED_OUTPATIENT_CLINIC_OR_DEPARTMENT_OTHER)
Admission: EM | Admit: 2017-06-23 | Discharge: 2017-06-23 | Disposition: A | Discharge status: Home / Self Care | Payer: Medicare Other | Attending: Emergency Medicine | Admitting: Emergency Medicine

## 2017-06-23 ENCOUNTER — Telehealth: Payer: Self-pay

## 2017-06-23 ENCOUNTER — Encounter (HOSPITAL_BASED_OUTPATIENT_CLINIC_OR_DEPARTMENT_OTHER): Payer: Self-pay | Admitting: *Deleted

## 2017-06-23 DIAGNOSIS — R310 Gross hematuria: Secondary | ICD-10-CM | POA: Insufficient documentation

## 2017-06-23 DIAGNOSIS — Z87891 Personal history of nicotine dependence: Secondary | ICD-10-CM | POA: Diagnosis not present

## 2017-06-23 DIAGNOSIS — Z7982 Long term (current) use of aspirin: Secondary | ICD-10-CM | POA: Diagnosis not present

## 2017-06-23 DIAGNOSIS — E119 Type 2 diabetes mellitus without complications: Secondary | ICD-10-CM | POA: Insufficient documentation

## 2017-06-23 DIAGNOSIS — Z96611 Presence of right artificial shoulder joint: Secondary | ICD-10-CM | POA: Diagnosis not present

## 2017-06-23 DIAGNOSIS — R350 Frequency of micturition: Secondary | ICD-10-CM | POA: Insufficient documentation

## 2017-06-23 DIAGNOSIS — Z79899 Other long term (current) drug therapy: Secondary | ICD-10-CM | POA: Diagnosis not present

## 2017-06-23 DIAGNOSIS — N3091 Cystitis, unspecified with hematuria: Secondary | ICD-10-CM

## 2017-06-23 DIAGNOSIS — R3915 Urgency of urination: Secondary | ICD-10-CM | POA: Insufficient documentation

## 2017-06-23 DIAGNOSIS — R300 Dysuria: Secondary | ICD-10-CM | POA: Diagnosis present

## 2017-06-23 DIAGNOSIS — N390 Urinary tract infection, site not specified: Secondary | ICD-10-CM

## 2017-06-23 DIAGNOSIS — M545 Low back pain: Secondary | ICD-10-CM | POA: Insufficient documentation

## 2017-06-23 DIAGNOSIS — Z794 Long term (current) use of insulin: Secondary | ICD-10-CM | POA: Diagnosis not present

## 2017-06-23 DIAGNOSIS — B9620 Unspecified Escherichia coli [E. coli] as the cause of diseases classified elsewhere: Secondary | ICD-10-CM | POA: Insufficient documentation

## 2017-06-23 DIAGNOSIS — I10 Essential (primary) hypertension: Secondary | ICD-10-CM | POA: Insufficient documentation

## 2017-06-23 LAB — URINALYSIS, MICROSCOPIC (REFLEX)
RBC / HPF: >50 RBC/hpf (ref 0–5)
WBC, UA: >50 WBC/hpf (ref 0–5)

## 2017-06-23 LAB — URINALYSIS, ROUTINE W REFLEX MICROSCOPIC

## 2017-06-23 MED ORDER — PHENAZOPYRIDINE HCL 200 MG PO TABS: 200.0000 mg | ORAL_TABLET | Freq: Three times a day (TID) | ORAL | 0 refills | Status: DC | PRN

## 2017-06-23 MED ORDER — CEPHALEXIN 250 MG PO CAPS
1000.0000 mg | ORAL_CAPSULE | Freq: Once | ORAL | Status: AC
  Administered 2017-06-23: 1000 mg via ORAL
  Filled 2017-06-23: qty 4

## 2017-06-23 MED ORDER — PHENAZOPYRIDINE HCL 100 MG PO TABS
200.0000 mg | ORAL_TABLET | Freq: Once | ORAL | Status: AC
  Administered 2017-06-23: 200 mg via ORAL

## 2017-06-23 MED ORDER — CEPHALEXIN 500 MG PO CAPS: 500.0000 mg | ORAL_CAPSULE | Freq: Two times a day (BID) | ORAL | 0 refills | Status: AC

## 2017-06-23 MED ORDER — CIPROFLOXACIN HCL 250 MG PO TABS: 250.0000 mg | ORAL_TABLET | Freq: Two times a day (BID) | ORAL | 0 refills | Status: DC

## 2017-06-23 MED ORDER — PHENAZOPYRIDINE HCL 100 MG PO TABS
ORAL_TABLET | ORAL | Status: AC
  Filled 2017-06-23: qty 2

## 2017-06-23 MED ORDER — ONDANSETRON 8 MG PO TBDP
8.0000 mg | ORAL_TABLET | Freq: Once | ORAL | Status: DC
  Filled 2017-06-23: qty 1

## 2017-06-23 MED ORDER — IBUPROFEN 400 MG PO TABS
600.0000 mg | ORAL_TABLET | Freq: Once | ORAL | Status: DC
  Filled 2017-06-23: qty 1

## 2017-06-23 NOTE — ED Provider Notes (Signed)
[facility] Provider Note: Georgena Spurling, MD, FACEP  CSN: [id number] MRN: [id number] ARRIVAL: 06/23/17 at [place]: [place]  Urinary Tract Infection   HISTORY OF PRESENT ILLNESS  06/23/17 2:13 AM [name] is a 72 y.o. female with a 6-day history of dysuria (burning with urination) along with urinary frequency, urinary urgency, sense of incomplete bladder emptying and low back pain.  She was seen by her PCP 3 days ago and diagnosed with a urinary tract infection but was not started on antibiotics pending culture results.  The culture has subsequently grown out E. coli, pansensitive.  Her symptoms have worsened over the past 2 days and have become severe and she has developed gross hematuria.  She denies fever, chills, nausea or vomiting.  She has had some diarrhea.  She denies flank pain.   Past Medical History:  Diagnosis Date  . Adenomatous colon polyp 03/1988  . Anemia    borderline  . Arthritis    R shoulder - degenerative   . Cataract    beginning stages  . Chest pain     Sept 2011: stress test neg  . Depression    sees Dr.Cotle  . Diabetes mellitus    dr Buddy Duty  . Diverticulosis   . Fatty liver    Increased LFTs, saw GI 06/2011, likely from fatty liver   . GERD (gastroesophageal reflux disease)   . History of hiatal hernia   . Hyperlipemia   . Hypertension   . IBS (irritable bowel syndrome)    and dyspepsia  . Memory impairment 08/2014   mild cognitive impairment vs. mild dementia, reommended reinstating of cholinesterase inhibitor   . Osteopenia    dexa 06/2007 and 10/11 Rx CA vitamin d  . RLS (restless legs syndrome)    h/o- no longer using Requip    Past Surgical History:  Procedure Laterality Date  . BREAST BIOPSY Left 10/11/2015   fibroadenoma w/ calcifications, no evidence of malignancy, recommend mammogram repeat-6 mnths  . POLYPECTOMY    . REVERSE SHOULDER ARTHROPLASTY Right 04/21/2014   Procedure: REVERSE  SHOULDER ARTHROPLASTY;  Surgeon: Tania Ade, MD;  Location: [facility];  Service: Orthopedics;  Laterality: Right;  Right reverse total shoulder replacement  . TONSILLECTOMY    . UTERINE FIBROID EMBOLIZATION     90s    Family History  Problem Relation Age of Onset  . Lung cancer Mother        smoker  . Colon cancer Father        F dx in his 32s  . Ovarian cancer Paternal Aunt        ?  . Diabetes Other        aunts-uncles   . Stroke Other        aunts-uncles   . Heart attack Maternal Grandmother        60s  . Other Other        niece- glioblastoma  . Breast cancer Neg Hx   . Rectal cancer Neg Hx   . Stomach cancer Neg Hx   . Esophageal cancer Neg Hx     Social History   Tobacco Use  . Smoking status: Former Smoker    Packs/day: 2.00    Years: 10.00    Pack years: 20.00    Types: Cigarettes    Last attempt to quit: 11/26/1975    Years since quitting: 41.6  . Smokeless tobacco: Never Used  . Tobacco comment: used  to smoke 2 ppd  Substance Use Topics  . Alcohol use: Yes    Alcohol/week: 0.0 oz    Comment: rarely  . Drug use: No    Prior to Admission medications   Medication Sig Start Date End Date Taking? Authorizing Provider  ACCU-CHEK FASTCLIX LANCETS MISC daily. use as directed 04/26/17   [provider]  acetaminophen (TYLENOL) 325 MG tablet Take 650 mg by mouth every 6 (six) hours as needed for mild pain or headache.    [provider]  acetaminophen-codeine (TYLENOL #3) 300-30 MG tablet Take 1 tablet by mouth every 6 (six) hours as needed. for pain 03/06/17   [provider]  aspirin (ASPIR-LOW) 81 MG EC tablet Take 1 tablet (81 mg total) by mouth daily. 01/09/17   Colon Branch, MD  atenolol (TENORMIN) 50 MG tablet Take 0.5 tablets (25 mg total) by mouth daily. 05/05/15   Colon Branch, MD  atorvastatin (LIPITOR) 40 MG tablet Take 1 tablet (40 mg total) by mouth daily. 01/09/17   Colon Branch, MD  BD VEO INSULIN SYRINGE U/F 31G X 15/64" 1 ML  MISC USE TO INJECT insulin EVERY DAY 05/14/17   [provider]  darifenacin (ENABLEX) 15 MG 24 hr tablet Take 1 tablet (15 mg total) by mouth daily. 01/09/17   Colon Branch, MD  donepezil (ARICEPT) 10 MG tablet Take 2 tablets (20 mg total) by mouth at bedtime.    Colon Branch, MD  Ferrous Fumarate-Vitamin C ER (FERRO-SEQUELS) 65-25 MG TBCR Take 1 tablet by mouth daily. 01/09/17   Colon Branch, MD  Insulin Detemir (LEVEMIR FLEXTOUCH) 100 UNIT/ML Pen Inject 52 Units into the skin at bedtime.     Delrae Rend, MD  losartan (COZAAR) 25 MG tablet Take 1 tablet (25 mg total) by mouth every evening. 02/05/17   Colon Branch, MD  memantine (NAMENDA) 10 MG tablet Take 10 mg by mouth 2 (two) times daily. 02/13/17   [provider]  metFORMIN (GLUCOPHAGE) 1000 MG tablet Take 1,000 mg by mouth 2 (two) times daily. 02/13/17   [provider]  Multiple Vitamins-Minerals (CENTRUM SILVER 50+WOMEN) TABS Take 1 tablet by mouth daily. 12/05/16   Shelda Pal, DO  naproxen sodium (ALEVE) 220 MG tablet Take 220-440 mg by mouth every 4 (four) hours as needed (for pain).    [provider]  pantoprazole (PROTONIX) 40 MG tablet TAKE 1 TABLET BY MOUTH 2 TIMES DAILY BEFORE A MEAL 01/09/17   Colon Branch, MD  sertraline (ZOLOFT) 50 MG tablet Take 1 tablet (50 mg total) by mouth daily. 06/04/17   Colon Branch, MD  sodium fluoride (PREVIDENT 5000 DRY MOUTH) 1.1 % GEL dental gel Place 1 application onto teeth at bedtime as needed (for teeth).     [provider]  tiZANidine (ZANAFLEX) 2 MG tablet Take 2-4 mg by mouth 2 (two) times daily as needed. 02/14/17   [provider]  vitamin B-12 (CYANOCOBALAMIN) 1000 MCG tablet Take 1 tablet (1,000 mcg total) by mouth daily. 09/16/16   Colon Branch, MD    Allergies Sulfa antibiotics; Trulicity [dulaglutide]; and Penicillins   REVIEW OF SYSTEMS  Negative except as noted here or in the History of Present Illness.   PHYSICAL EXAMINATION    Initial Vital Signs Blood pressure 128/65, pulse 68, temperature 98.4 F (36.9 C), temperature source Oral, resp. rate 20, height 5' 6.5" (1.689 m), weight 81.6 kg (180 lb), SpO2 98 %.  Examination General: Well-developed, well-nourished female in no acute distress; appearance consistent with age of record HENT: normocephalic; atraumatic Eyes: pupils equal, round and reactive to light; extraocular muscles intact Neck: supple Heart: regular rate and rhythm; no murmurs, rubs or gallops Lungs: clear to auscultation bilaterally Abdomen: soft; nondistended; suprapubic tenderness; bowel sounds present GU: No CVA tenderness Extremities: No deformity; full range of motion; pulses normal Neurologic: Awake, alert and oriented; motor function intact in all extremities and symmetric; no facial droop Skin: Warm and dry Psychiatric: Normal mood and affect   RESULTS  Summary of this visit's results, reviewed by myself:   EKG Interpretation  Date/Time:    Ventricular Rate:    PR Interval:    QRS Duration:   QT Interval:    QTC Calculation:   R Axis:     Text Interpretation:        Laboratory Studies: Results for orders placed or performed during the hospital encounter of 06/23/17 (from the past 24 hour(s))  Urinalysis, Routine w reflex microscopic     Status: Abnormal   Collection Time: 06/23/17  1:30 AM  Result Value Ref Range   Color, Urine RED (A) YELLOW   APPearance TURBID (A) CLEAR   Specific Gravity, Urine  1.005 - 1.030    TEST NOT REPORTED DUE TO COLOR INTERFERENCE OF URINE PIGMENT   pH  5.0 - 8.0    TEST NOT REPORTED DUE TO COLOR INTERFERENCE OF URINE PIGMENT   Glucose, UA (A) NEGATIVE mg/dL    TEST NOT REPORTED DUE TO COLOR INTERFERENCE OF URINE PIGMENT   Hgb urine dipstick (A) NEGATIVE    TEST NOT REPORTED DUE TO COLOR INTERFERENCE OF URINE PIGMENT   Bilirubin Urine (A) NEGATIVE    TEST NOT REPORTED DUE TO COLOR INTERFERENCE OF URINE PIGMENT   Ketones, ur (A) NEGATIVE  mg/dL    TEST NOT REPORTED DUE TO COLOR INTERFERENCE OF URINE PIGMENT   Protein, ur (A) NEGATIVE mg/dL    TEST NOT REPORTED DUE TO COLOR INTERFERENCE OF URINE PIGMENT   Nitrite (A) NEGATIVE    TEST NOT REPORTED DUE TO COLOR INTERFERENCE OF URINE PIGMENT   Leukocytes, UA (A) NEGATIVE    TEST NOT REPORTED DUE TO COLOR INTERFERENCE OF URINE PIGMENT  Urinalysis, Microscopic (reflex)     Status: Abnormal   Collection Time: 06/23/17  1:30 AM  Result Value Ref Range   RBC / HPF >50 0 - 5 RBC/hpf   WBC, UA >50 0 - 5 WBC/hpf   Bacteria, UA MANY (A) NONE SEEN   Squamous Epithelial / LPF 0-5 0 - 5   Imaging Studies: No results found.  ED COURSE and MDM  Nursing notes and initial vitals signs, including pulse oximetry, reviewed.  Vitals:   06/23/17 0123 06/23/17 0124  BP: 128/65   Pulse: 68   Resp: 20   Temp: 98.4 F (36.9 C)   TempSrc: Oral   SpO2: 98%   Weight:  81.6 kg (180 lb)  Height:  5' 6.5" (1.689 m)   Urinalysis consistent with hemorrhagic cystitis.  Culture as noted above shows E. Coli, pansensitive.  PROCEDURES    ED DIAGNOSES     ICD-10-CM   1. Hemorrhagic cystitis N30.91   2. E. coli urinary tract infection N39.0    B96.20        Mayes Sangiovanni, MD 06/23/17 0225

## 2017-06-23 NOTE — ED Triage Notes (Addendum)
C/o pelvic pressure & discomfort, hematuria, dysuria, frequency, urgency, incomplete emptying, and back discomfort. Recently dx'd with UTI, but was waiting on culture results to prescribe ABT. Urine bright red at triage. Denies fever. APAP taken at 0000MN. PCP Dr. Larose Kells. See allergies.  Alert, NAD, calm, interactive, resps e/u, speaking in clear complete sentences, no dyspnea noted, skin W&D, steady gait, VSS. Family at [facility].

## 2017-06-23 NOTE — Telephone Encounter (Signed)
LMOM informing Pt to hold Aricept (spelled medication for Pt as well) for 5 days while on Cipro- d/t possible drug interaction. Instructed her to call if questions/concerns.

## 2017-06-23 NOTE — Telephone Encounter (Signed)
I checked interactions in up-to-date and  epic, no interactions found.  Spoke with Juliann Pulse, she got a warning  from a different program ref  QT prolongation. Plan: Will proceed with Cipro Please call patient, hold Aricept for 5 days just as a precaution.

## 2017-06-23 NOTE — Telephone Encounter (Signed)
[name] at [employer] [employer] patient has Rx for Cipro which comes up as having a  serious interaction with her Aricept that she currently take. States it causes Arythmias. Would like to know if there is another medication you could prescribe.  States ED sent Cephelexin which she cannot take either. Please advise.

## 2017-06-23 NOTE — Addendum Note (Signed)
Addended byDamita Dunnings D on: 06/23/2017 08:10 AM   Modules accepted: Orders

## 2017-07-08 ENCOUNTER — Other Ambulatory Visit: Payer: Self-pay | Admitting: Internal Medicine

## 2017-07-14 ENCOUNTER — Telehealth: Payer: Self-pay

## 2017-07-14 NOTE — Telephone Encounter (Signed)
Noted, thx.

## 2017-07-14 NOTE — Telephone Encounter (Signed)
Patient called [facility] on Saturday regarding burning with urination. CAlled patient to see if she needed an appointment, statews she ended up going to [facility] over the weekend and was given ABT.i

## 2017-07-21 ENCOUNTER — Ambulatory Visit (INDEPENDENT_AMBULATORY_CARE_PROVIDER_SITE_OTHER): Payer: Medicare Other | Admitting: Internal Medicine

## 2017-07-21 ENCOUNTER — Encounter: Payer: Self-pay | Admitting: Internal Medicine

## 2017-07-21 VITALS — BP 126/68 | HR 64 | Temp 98.0°F | Resp 16 | Ht 65.25 in | Wt 177.2 lb

## 2017-07-21 DIAGNOSIS — J302 Other seasonal allergic rhinitis: Secondary | ICD-10-CM | POA: Diagnosis not present

## 2017-07-21 MED ORDER — AZELASTINE HCL 0.1 % NA SOLN: 2.0000 | Freq: Every evening | NASAL | 3 refills | Status: DC | PRN

## 2017-07-21 NOTE — Progress Notes (Signed)
Subjective:    Patient ID: [name], female    DOB: 10/24/1945, 72 y.o.   MRN: [id number]  DOS:  07/21/2017 Type of visit - description : acute, here by herself.   Interval history: She was seen last month with left ear and face discomfort, that gradually resolved. Current symptoms started about a week ago: Left ear left nostril feels stopped up, also sinus discomfort on the left. Feels like she needs to clear her throat constantly. Has taken Zyrtec with some relief.  Review of Systems No Fever chills Mild cough, she seems related to clearing her throat. Occasional sneezing Denies itchy or watery eyes. No facial pain per se. No facial weakness.  Past Medical History:  Diagnosis Date  . Adenomatous colon polyp 03/1988  . Anemia    borderline  . Arthritis    R shoulder - degenerative   . Cataract    beginning stages  . Chest pain     Sept 2011: stress test neg  . Depression    sees Dr.Cotle  . Diabetes mellitus    dr Buddy Duty  . Diverticulosis   . Fatty liver    Increased LFTs, saw GI 06/2011, likely from fatty liver   . GERD (gastroesophageal reflux disease)   . History of hiatal hernia   . Hyperlipemia   . Hypertension   . IBS (irritable bowel syndrome)    and dyspepsia  . Memory impairment 08/2014   mild cognitive impairment vs. mild dementia, reommended reinstating of cholinesterase inhibitor   . Osteopenia    dexa 06/2007 and 10/11 Rx CA vitamin d  . RLS (restless legs syndrome)    h/o- no longer using Requip    Past Surgical History:  Procedure Laterality Date  . BREAST BIOPSY Left 10/11/2015   fibroadenoma w/ calcifications, no evidence of malignancy, recommend mammogram repeat-6 mnths  . POLYPECTOMY    . REVERSE SHOULDER ARTHROPLASTY Right 04/21/2014   Procedure: REVERSE SHOULDER ARTHROPLASTY;  Surgeon: Tania Ade, MD;  Location: [facility];  Service: Orthopedics;  Laterality: Right;  Right reverse total shoulder replacement  . TONSILLECTOMY    .  UTERINE FIBROID EMBOLIZATION     90s    Social History   Socioeconomic History  . Marital status: Widowed    Spouse name: [name]  . Number of children: 3  . Years of education: Not on file  . Highest education level: Not on file  Occupational History  . Occupation: retired, was a Leisure centre manager)    Comment: RN  Social Needs  . Financial resource strain: Not on file  . Food insecurity:    Worry: Not on file    Inability: Not on file  . Transportation needs:    Medical: Not on file    Non-medical: Not on file  Tobacco Use  . Smoking status: Former Smoker    Packs/day: 2.00    Years: 10.00    Pack years: 20.00    Types: Cigarettes    Last attempt to quit: 11/26/1975    Years since quitting: 41.6  . Smokeless tobacco: Never Used  . Tobacco comment: used to smoke 2 ppd  Substance and Sexual Activity  . Alcohol use: Yes    Alcohol/week: 0.0 oz    Comment: rarely  . Drug use: No  . Sexual activity: Not Currently  Lifestyle  . Physical activity:    Days per week: Not on file    Minutes per session: Not on file  . Stress: Not  on file  Relationships  . Social connections:    Talks on phone: Not on file    Gets together: Not on file    Attends religious service: Not on file    Active member of club or organization: Not on file    Attends meetings of clubs or organizations: Not on file    Relationship status: Not on file  . Intimate partner violence:    Fear of current or ex partner: Not on file    Emotionally abused: Not on file    Physically abused: Not on file    Forced sexual activity: Not on file  Other Topics Concern  . Not on file  Social History Narrative   Widow , lives by herself at [facility] in a 1 bedroom apt;  still drives, daughters [name]) live  in [facility] [place] in [place]     5 g-children      Allergies as of 07/21/2017      Reactions   Sulfa Antibiotics Nausea Only   Trulicity [dulaglutide] Nausea Only   Penicillins Rash, Other  (See Comments)   Has patient had a PCN reaction causing immediate rash, facial/tongue/throat swelling, SOB or lightheadedness with hypotension: No Has patient had a PCN reaction causing severe rash involving mucus membranes or skin necrosis: No Has patient had a PCN reaction that required hospitalization No Has patient had a PCN reaction occurring within the last 10 years: No If all of the above answers are "NO", then may proceed with Cephalosporin use.      Medication List        Accurate as of 07/21/17 11:59 PM. Always use your most recent med list.          ACCU-CHEK FASTCLIX LANCETS Misc daily. use as directed   acetaminophen 325 MG tablet Commonly known as:  TYLENOL Take 650 mg by mouth every 6 (six) hours as needed for mild pain or headache.   ARICEPT 10 MG tablet Generic drug:  donepezil Take 2 tablets (20 mg total) by mouth at bedtime.   aspirin 81 MG EC tablet Commonly known as:  ASPIR-LOW Take 1 tablet (81 mg total) by mouth daily.   atenolol 50 MG tablet Commonly known as:  TENORMIN Take 0.5 tablets (25 mg total) by mouth daily.   atorvastatin 40 MG tablet Commonly known as:  LIPITOR Take 1 tablet (40 mg total) by mouth daily.   azelastine 0.1 % nasal spray Commonly known as:  ASTELIN Place 2 sprays into both nostrils at bedtime as needed for rhinitis. Use in each nostril as directed   BD VEO INSULIN SYRINGE U/F 31G X 15/64" 1 ML Misc Generic drug:  Insulin Syringe-Needle U-100 USE TO INJECT insulin EVERY DAY   CENTRUM SILVER 50+WOMEN Tabs Take 1 tablet by mouth daily.   darifenacin 15 MG 24 hr tablet Commonly known as:  ENABLEX Take 1 tablet (15 mg total) by mouth daily.   Ferrous Fumarate-Vitamin C ER 65-25 MG Tbcr Commonly known as:  FERRO-SEQUELS Take 1 tablet by mouth daily.   LEVEMIR FLEXTOUCH 100 UNIT/ML Pen Generic drug:  Insulin Detemir Inject 52 Units into the skin at bedtime.   losartan 25 MG tablet Commonly known as:  COZAAR Take 1  tablet (25 mg total) by mouth every evening.   memantine 10 MG tablet Commonly known as:  NAMENDA Take 10 mg by mouth 2 (two) times daily.   metFORMIN 1000 MG tablet Commonly known as:  GLUCOPHAGE Take 1,000 mg  by mouth 2 (two) times daily.   naproxen sodium 220 MG tablet Commonly known as:  ALEVE Take 220-440 mg by mouth every 4 (four) hours as needed (for pain).   pantoprazole 40 MG tablet Commonly known as:  PROTONIX Take 1 tablet (40 mg total) by mouth 2 (two) times daily before a meal.   PREVIDENT 5000 DRY MOUTH 1.1 % Gel dental gel Generic drug:  sodium fluoride Place 1 application onto teeth at bedtime as needed (for teeth).   sertraline 50 MG tablet Commonly known as:  ZOLOFT Take 1 tablet (50 mg total) by mouth daily.   tiZANidine 2 MG tablet Commonly known as:  ZANAFLEX Take 2-4 mg by mouth 2 (two) times daily as needed.   vitamin B-12 1000 MCG tablet Commonly known as:  CYANOCOBALAMIN Take 1 tablet (1,000 mcg total) by mouth daily.          Objective:   Physical Exam BP 126/68 (BP Location: Left Arm, Patient Position: Sitting, Cuff Size: Small)   Pulse 64   Temp 98 F (36.7 C) (Oral)   Resp 16   Ht 5' 5.25" (1.657 m)   Wt 177 lb 4 oz (80.4 kg)   SpO2 98%   BMI 29.27 kg/m  General:   Well developed, NAD, see BMI.  HEENT:  Normocephalic . Face symmetric, atraumatic. TMs normal, throat no redness, symmetric. Nose is slightly congested, sinuses no TTP. Lungs:  CTA B Normal respiratory effort, no intercostal retractions, no accessory muscle use. Heart: RRR,  no murmur.  No pretibial edema bilaterally  Skin: Not pale. Not jaundice Neurologic:  alert & oriented X3.  Today Speech normal, gait appropriate for age and unassisted Psych--  Cognition and judgment appear intact.  Cooperative with normal attention span and concentration.  Behavior appropriate. No anxious or depressed appearing.      Assessment & Plan:   Assessment  DM Dr.  Buddy Duty HTN Hyperlipidemia Depression used to see Dr. Clovis Pu Mild cognitive impairment  MMSE 2015 --> 28, on Aricept, namenda added 04-2016 sees Dr Everette Rank  OSA -- mild, also told RLS (Rx requip); saw Dr Gwenette Greet 2014, no CPAP GI: --IBS, colon polyps, diverticulosis, hiatal hernia --Chronic constipation likely part of her IBS syndrome  --Fatty liver >>> GI eval 2013 --iron fec anemia:  cscope 04-2014 : 2 polyps; + hemocult @ GI office 10-2014: EGD done (-), bx neg Osteopenia: T score -1.1  2009 , osteopenia again per dexa 05-2013 , rx ca-vit d B12 deficiency  RLS MSK: --DJD frozen shoulder Dizziness: chronic, carotid US 2016 neg, saw cards-- not likely CV related; saw neuro DR Everette Rank 02-2015  Chest pain 09-2009, negative stress test Thyroid nodules: Incidental   by Carotid US, BX 11-2014: Atypical findings, [facility] III, f/u  Endocrine RLL Lung nodule: Stable per CT 09-2016, no need for further  PLAN:  Allergic rhinitis C/o left ear pressure, left sinus congestion, exam is benign, face is symmetric. Recommend to continue Zyrtec, Flonase and Astelin, see instructions. We will call soon if not improving, consider abx.  Sinusitis?

## 2017-07-21 NOTE — Progress Notes (Signed)
Pre visit review using our clinic review tool, if applicable. No additional management support is needed unless otherwise documented below in the visit note. 

## 2017-07-21 NOTE — Patient Instructions (Signed)
  For nasal congestion: Use OTC   Flonase : 2 nasal sprays on each side of the nose in the morning until you feel better Use ASTELIN a prescribed spray : 2 nasal sprays on each side of the nose at night until you feel better   Avoid decongestants such as  Pseudoephedrine or phenylephrine    Call if not gradually better over the next  10 days  Call anytime if the symptoms are severe

## 2017-07-22 NOTE — Assessment & Plan Note (Signed)
Allergic rhinitis C/o left ear pressure, left sinus congestion, exam is benign, face is symmetric. Recommend to continue Zyrtec, Flonase and Astelin, see instructions. We will call soon if not improving, consider abx.  Sinusitis?

## 2017-07-29 ENCOUNTER — Encounter: Payer: Self-pay | Admitting: Podiatry

## 2017-07-29 ENCOUNTER — Ambulatory Visit (INDEPENDENT_AMBULATORY_CARE_PROVIDER_SITE_OTHER): Payer: Medicare Other | Admitting: Podiatry

## 2017-07-29 DIAGNOSIS — B351 Tinea unguium: Secondary | ICD-10-CM | POA: Diagnosis not present

## 2017-07-29 DIAGNOSIS — M79676 Pain in unspecified toe(s): Secondary | ICD-10-CM | POA: Diagnosis not present

## 2017-07-29 NOTE — Progress Notes (Signed)
Presents today chief complaint of painful elongated toenails 1 through 5 bilateral.  Objective: Toenails are long thick yellow dystrophic with mycotic pulses remain palpable no open lesions or wounds.  Her graph assessment: Pain in limb secondary to onychomycosis.  Plan: Debridement of toenails 1 through 5 bilateral.

## 2017-08-02 ENCOUNTER — Other Ambulatory Visit: Payer: Self-pay | Admitting: Otolaryngology

## 2017-08-02 DIAGNOSIS — E079 Disorder of thyroid, unspecified: Secondary | ICD-10-CM

## 2017-08-05 ENCOUNTER — Other Ambulatory Visit: Payer: Self-pay | Admitting: Internal Medicine

## 2017-08-08 ENCOUNTER — Ambulatory Visit
Admission: RE | Admit: 2017-08-08 | Discharge: 2017-08-08 | Disposition: A | Discharge status: Home / Self Care | Payer: Medicare Other | Source: Ambulatory Visit | Attending: Otolaryngology | Admitting: Otolaryngology

## 2017-08-08 DIAGNOSIS — E079 Disorder of thyroid, unspecified: Secondary | ICD-10-CM

## 2017-08-11 ENCOUNTER — Other Ambulatory Visit: Payer: Self-pay

## 2017-08-12 ENCOUNTER — Other Ambulatory Visit: Payer: Self-pay

## 2017-08-22 ENCOUNTER — Encounter: Payer: Self-pay | Admitting: Podiatry

## 2017-08-25 ENCOUNTER — Encounter: Payer: Self-pay | Admitting: Medical

## 2017-08-25 ENCOUNTER — Ambulatory Visit (INDEPENDENT_AMBULATORY_CARE_PROVIDER_SITE_OTHER): Payer: Medicare Other | Admitting: Medical

## 2017-08-25 VITALS — BP 126/69 | HR 72 | Temp 98.1°F | Resp 16 | Ht 65.25 in | Wt 174.4 lb

## 2017-08-25 DIAGNOSIS — K921 Melena: Secondary | ICD-10-CM | POA: Diagnosis not present

## 2017-08-25 DIAGNOSIS — D649 Anemia, unspecified: Secondary | ICD-10-CM | POA: Diagnosis not present

## 2017-08-25 DIAGNOSIS — T148XXA Other injury of unspecified body region, initial encounter: Secondary | ICD-10-CM

## 2017-08-25 DIAGNOSIS — L089 Local infection of the skin and subcutaneous tissue, unspecified: Secondary | ICD-10-CM | POA: Diagnosis not present

## 2017-08-25 LAB — CBC WITH DIFFERENTIAL/PLATELET
Basophils Absolute: 0 10*3/uL (ref 0.0–0.1)
Basophils Relative: 0.6 % (ref 0.0–3.0)
Eosinophils Absolute: 0.3 10*3/uL (ref 0.0–0.7)
Eosinophils Relative: 4.8 % (ref 0.0–5.0)
HCT: 36.9 % (ref 36.0–46.0)
Hemoglobin: 12.3 g/dL (ref 12.0–15.0)
Lymphocytes Relative: 27.6 % (ref 12.0–46.0)
Lymphs Abs: 1.5 10*3/uL (ref 0.7–4.0)
MCHC: 33.4 g/dL (ref 30.0–36.0)
MCV: 85.1 fl (ref 78.0–100.0)
Monocytes Absolute: 0.4 10*3/uL (ref 0.1–1.0)
Monocytes Relative: 6.7 % (ref 3.0–12.0)
Neutro Abs: 3.3 10*3/uL (ref 1.4–7.7)
Neutrophils Relative %: 60.3 % (ref 43.0–77.0)
Platelets: 173 10*3/uL (ref 150.0–400.0)
RBC: 4.33 Mil/uL (ref 3.87–5.11)
RDW: 13.8 % (ref 11.5–15.5)
WBC: 5.4 10*3/uL (ref 4.0–10.5)

## 2017-08-25 MED ORDER — MUPIROCIN 2 % EX OINT: TOPICAL_OINTMENT | CUTANEOUS | 0 refills | Status: DC

## 2017-08-25 MED ORDER — DOXYCYCLINE HYCLATE 100 MG PO TABS: 100.0000 mg | ORAL_TABLET | Freq: Two times a day (BID) | ORAL | 0 refills | Status: DC

## 2017-08-25 NOTE — Progress Notes (Signed)
Subjective:    Patient ID: [name], female    DOB: [birth year], 72 y.o.   MRN: [id number]  HPI  Pt in for evaluation.  She had top of her rt foot small scab present for less than a week. She states she was wearing some new shoes that were little tight before she saw scab. She note faint redness around the scab. She doe not report any obvious insect bite to rt foot region.  She does report 2 years ago wear she lives she had a bed bug outbreak. But no known recent outbreak.   Pt does not remember any tick or anything attached to her foot.  11 months ago her a1c was 6.6. He sugars when she checks at home around 150 or less.   Review of Systems  Constitutional: Negative for chills, fatigue and fever.  Respiratory: Negative for cough, chest tightness, shortness of breath and wheezing.   Cardiovascular: Negative for chest pain and palpitations.  Gastrointestinal: Negative for abdominal distention, blood in stool, diarrhea, nausea and rectal pain.       End of exam noted some back stools.  Skin:       Small scab/smal abrasion on her mid foot region/top aspect.  Hematological: Negative for adenopathy. Does not bruise/bleed easily.  Psychiatric/Behavioral: Negative for behavioral problems and confusion.    Past Medical History:  Diagnosis Date  . Adenomatous colon polyp 03/1988  . Anemia    borderline  . Arthritis    R shoulder - degenerative   . Cataract    beginning stages  . Chest pain     Sept 2011: stress test neg  . Depression    sees Dr.Cotle  . Diabetes mellitus    dr Buddy Duty  . Diverticulosis   . Fatty liver    Increased LFTs, saw GI 06/2011, likely from fatty liver   . GERD (gastroesophageal reflux disease)   . History of hiatal hernia   . Hyperlipemia   . Hypertension   . IBS (irritable bowel syndrome)    and dyspepsia  . Memory impairment 08/2014   mild cognitive impairment vs. mild dementia, reommended reinstating of cholinesterase inhibitor   .  Osteopenia    dexa 06/2007 and 10/11 Rx CA vitamin d  . RLS (restless legs syndrome)    h/o- no longer using Requip     Social History   Socioeconomic History  . Marital status: Widowed    Spouse name: [name]  . Number of children: 3  . Years of education: Not on file  . Highest education level: Not on file  Occupational History  . Occupation: retired, was a Leisure centre manager)    Comment: RN  Social Needs  . Financial resource strain: Not on file  . Food insecurity:    Worry: Not on file    Inability: Not on file  . Transportation needs:    Medical: Not on file    Non-medical: Not on file  Tobacco Use  . Smoking status: Former Smoker    Packs/day: 2.00    Years: 10.00    Pack years: 20.00    Types: Cigarettes    Last attempt to quit: 11/26/1975    Years since quitting: 41.7  . Smokeless tobacco: Never Used  . Tobacco comment: used to smoke 2 ppd  Substance and Sexual Activity  . Alcohol use: Yes    Alcohol/week: 0.0 standard drinks    Comment: rarely  . Drug use: No  . Sexual activity:  Not Currently  Lifestyle  . Physical activity:    Days per week: Not on file    Minutes per session: Not on file  . Stress: Not on file  Relationships  . Social connections:    Talks on phone: Not on file    Gets together: Not on file    Attends religious service: Not on file    Active member of club or organization: Not on file    Attends meetings of clubs or organizations: Not on file    Relationship status: Not on file  . Intimate partner violence:    Fear of current or ex partner: Not on file    Emotionally abused: Not on file    Physically abused: Not on file    Forced sexual activity: Not on file  Other Topics Concern  . Not on file  Social History Narrative   Widow , lives by herself at [facility] in a 1 bedroom apt;  still drives, daughters [name]) live  in [place] in [place]     5 g-children    Past Surgical History:  Procedure Laterality Date   . BREAST BIOPSY Left 10/11/2015   fibroadenoma w/ calcifications, no evidence of malignancy, recommend mammogram repeat-6 mnths  . POLYPECTOMY    . REVERSE SHOULDER ARTHROPLASTY Right 04/21/2014   Procedure: REVERSE SHOULDER ARTHROPLASTY;  Surgeon: Tania Ade, MD;  Location: [facility];  Service: Orthopedics;  Laterality: Right;  Right reverse total shoulder replacement  . TONSILLECTOMY    . UTERINE FIBROID EMBOLIZATION     90s    Family History  Problem Relation Age of Onset  . Lung cancer Mother        smoker  . Colon cancer Father        F dx in his 24s  . Ovarian cancer Paternal Aunt        ?  . Diabetes Other        aunts-uncles   . Stroke Other        aunts-uncles   . Heart attack Maternal Grandmother        60s  . Other Other        niece- glioblastoma  . Breast cancer Neg Hx   . Rectal cancer Neg Hx   . Stomach cancer Neg Hx   . Esophageal cancer Neg Hx     Allergies  Allergen Reactions  . Sulfa Antibiotics Nausea Only  . Trulicity [Dulaglutide] Nausea Only  . Penicillins Rash and Other (See Comments)    Has patient had a PCN reaction causing immediate rash, facial/tongue/throat swelling, SOB or lightheadedness with hypotension: No Has patient had a PCN reaction causing severe rash involving mucus membranes or skin necrosis: No Has patient had a PCN reaction that required hospitalization No Has patient had a PCN reaction occurring within the last 10 years: No If all of the above answers are "NO", then may proceed with Cephalosporin use.    Current Outpatient Medications on File Prior to Visit  Medication Sig Dispense Refill  . ACCU-CHEK FASTCLIX LANCETS MISC daily. use as directed  4  . acetaminophen (TYLENOL) 325 MG tablet Take 650 mg by mouth every 6 (six) hours as needed for mild pain or headache.    Marland Kitchen aspirin (ASPIR-LOW) 81 MG EC tablet Take 1 tablet (81 mg total) by mouth daily. 90 tablet 3  . atenolol (TENORMIN) 50 MG tablet Take 0.5 tablets (25 mg  total) by mouth daily. 45 tablet 1  .  atorvastatin (LIPITOR) 40 MG tablet Take 1 tablet (40 mg total) by mouth daily. 90 tablet 1  . azelastine (ASTELIN) 0.1 % nasal spray Place 2 sprays into both nostrils at bedtime as needed for rhinitis. Use in each nostril as directed 30 mL 3  . BD VEO INSULIN SYRINGE U/F 31G X 15/64" 1 ML MISC USE TO INJECT insulin EVERY DAY  11  . Cyanocobalamin (B-12) 1000 MCG TBCR Take 1 tablet by mouth daily. 90 tablet 3  . darifenacin (ENABLEX) 15 MG 24 hr tablet Take 1 tablet (15 mg total) by mouth daily. 90 tablet 1  . donepezil (ARICEPT) 10 MG tablet Take 2 tablets (20 mg total) by mouth at bedtime.    . Ferrous Fumarate-Vitamin C ER (FERRO-SEQUELS) 65-25 MG TBCR Take 1 tablet by mouth daily. 90 tablet 1  . Insulin Detemir (LEVEMIR FLEXTOUCH) 100 UNIT/ML Pen Inject 52 Units into the skin at bedtime.     Marland Kitchen losartan (COZAAR) 25 MG tablet Take 1 tablet (25 mg total) by mouth every evening. 90 tablet 1  . memantine (NAMENDA) 10 MG tablet Take 10 mg by mouth 2 (two) times daily.  3  . metFORMIN (GLUCOPHAGE) 1000 MG tablet Take 1,000 mg by mouth 2 (two) times daily.  3  . Multiple Vitamins-Minerals (CENTRUM SILVER 50+WOMEN) TABS Take 1 tablet by mouth daily. 30 tablet 11  . naproxen sodium (ALEVE) 220 MG tablet Take 220-440 mg by mouth every 4 (four) hours as needed (for pain).    . pantoprazole (PROTONIX) 40 MG tablet Take 1 tablet (40 mg total) by mouth 2 (two) times daily before a meal. 180 tablet 1  . sertraline (ZOLOFT) 50 MG tablet Take 1 tablet (50 mg total) by mouth daily. 30 tablet 0  . sodium fluoride (PREVIDENT 5000 DRY MOUTH) 1.1 % GEL dental gel Place 1 application onto teeth at bedtime as needed (for teeth).     Marland Kitchen tiZANidine (ZANAFLEX) 2 MG tablet Take 2-4 mg by mouth 2 (two) times daily as needed.     No current facility-administered medications on file prior to visit.     BP 126/69   Pulse 72   Temp 98.1 F (36.7 C) (Oral)   Resp 16   Ht 5' 5.25"  (1.657 m)   Wt 174 lb 6.4 oz (79.1 kg)   SpO2 100%   BMI 28.80 kg/m       Objective:   Physical Exam  General- No acute distress. Pleasant patient.  Lungs- Clear, even and unlabored. Heart- regular rate and rhythm. Neurologic- CNII- XII grossly intact.   Rt foot-6 mm slight small scab. Tender to palpation with area of surrouding faint pink rash surrounding Area of pink rash about 2 cm in diameter.        Assessment & Plan:  You have area of small skin abrasion/scab on top of rt foot. Concern for early skin infection in light of faint pink appearance with tenderness.  I want you to apply mupirocin twice daily and keep area covered with antibiotic to avoid friction injury/scab coming off too early.  Also start doxycycline rx advisement.(make sure you eat some before taking). Remember not to take on empty stomach.  Follow up in 2 weeks or as needed.  I think you will get better but if you area presents on follow up as non healiing small wound will refer to dermatologist.  As pt was leaving she also note some dark blackness to stool about 2-3 weeks. On iron but  has been on iron for long time before. No fatigue or weakness reported. Last cbc showed mild borderline anemia. Will repeat cbc today and order ifob.(colonoscopy report in 2016 states repeat in 5 years)  Mackie Pai, PA-C

## 2017-08-25 NOTE — Patient Instructions (Addendum)
  You have area of small skin abrasion/scab on top of rt foot. Concern for early skin infection in light faint pink appearance with tenderness  I want you to apply mupirocin twice daily and keep area covered with antibiotic to avoid friction injury/scab coming off too early.  Also start doxycycline rx advisement.(make sure you eat some before taking). Remember not to take on empty stomach.  Follow up in 2 weeks or as needed.  I think you will get better but if you area presents on follow up as non healiing small wound will refer to dermatologist.

## 2017-08-28 ENCOUNTER — Encounter: Payer: Self-pay | Admitting: Podiatry

## 2017-08-29 ENCOUNTER — Telehealth: Payer: Self-pay | Admitting: *Deleted

## 2017-08-29 ENCOUNTER — Other Ambulatory Visit (INDEPENDENT_AMBULATORY_CARE_PROVIDER_SITE_OTHER): Payer: Medicare Other

## 2017-08-29 DIAGNOSIS — K921 Melena: Secondary | ICD-10-CM | POA: Diagnosis not present

## 2017-08-29 LAB — FECAL OCCULT BLOOD, IMMUNOCHEMICAL: Fecal Occult Bld: NEGATIVE

## 2017-08-29 NOTE — Telephone Encounter (Signed)
Pt asked if she needed an antibiotic prior to her toenail procedure since she is diabetic.

## 2017-08-29 NOTE — Telephone Encounter (Signed)
I told pt as routine, we did not pre-medicate diabetics with antibiotics until the appt.

## 2017-09-02 ENCOUNTER — Telehealth: Payer: Self-pay | Admitting: Podiatry

## 2017-09-02 ENCOUNTER — Encounter: Payer: Self-pay | Admitting: Podiatry

## 2017-09-02 ENCOUNTER — Ambulatory Visit (INDEPENDENT_AMBULATORY_CARE_PROVIDER_SITE_OTHER): Payer: Medicare Other | Admitting: Podiatry

## 2017-09-02 ENCOUNTER — Telehealth: Payer: Self-pay

## 2017-09-02 ENCOUNTER — Encounter

## 2017-09-02 DIAGNOSIS — L600 Ingrowing nail: Secondary | ICD-10-CM

## 2017-09-02 MED ORDER — NEOMYCIN-POLYMYXIN-HC 1 % OT SOLN: OTIC | 1 refills | Status: DC

## 2017-09-02 NOTE — Telephone Encounter (Signed)
This is Riot Barrick again. Phone number is [phone]. The bandage that Dr. Milinda Pointer put on my toe has just fallen off on its own, I did not do anything for it to fall off. There is still blood oozing into the spot where he removed my toenail. I just need to know what I need to do about this. I would appreciate it if someone could call me back ASAP. I've got my foot elevated hoping it will help the bleeding slow down.

## 2017-09-02 NOTE — Patient Instructions (Signed)

## 2017-09-02 NOTE — Telephone Encounter (Signed)
Recommend to wait few weeks, she qualify for a high dose and we don't have it right now.

## 2017-09-02 NOTE — Telephone Encounter (Signed)
Author phoned pt. to convey Dr. Ethel Rana message. Pt. Unable to come for flu clinic mondays, so nurse visit appointment made for 9/25 at [facility], LPN, made aware.   Copied from [place]. Topic: General - Other >> Sep 02, 2017  2:12 PM Yvette Rack wrote: Reason for CRM: Pt would like a flu shot

## 2017-09-02 NOTE — Progress Notes (Signed)
She presents today states that she would like to have her toenail removed as she refers to the left hallux nail.  States that is coming more painful and is affecting her ability to perform her daily activities.  Objective: Vital signs are stable she is alert and oriented x3.  Pulses are palpable.  Thick dystrophic nail hallux left exquisitely tender on palpation no erythema no purulence no malodor.  Sharp incurvated nail margins are prevalent.  Assessment: Ingrown nail hallux left diabetes current hemoglobin A1c is a 6.6.  Plan: Discussed etiology pathology conservative versus surgical therapies apical matrixectomy was performed today after 3 cc of a 50-50 mixture of Marcaine plain and lidocaine plain was infiltrated in a hallux block.  Tolerated procedure well without complications.  She was provided both oral and written home-going instructions for the care and soaking of the toe as well as prescription for Cortisporin Otic which be applied twice daily after soaking.  Follow-up with her in 1 week.

## 2017-09-03 ENCOUNTER — Telehealth: Payer: Self-pay | Admitting: Podiatry

## 2017-09-03 NOTE — Telephone Encounter (Signed)
I spoke with pt and she states ,"Uh oh, I'm at [facility] and I forgot to put a bandaid on my toe." I asked pt if she was on a blood thinner and she stated yes. I told her that may be why she is having the bleeding, and may need more than a bandaid, that she could cover the area with roll gauze to add extra padding until the bleeding decreases then can use a bandaid. Pt states understanding.

## 2017-09-03 NOTE — Telephone Encounter (Signed)
I know I called yesterday and I'm sorry but I forgot what you told me. My bandage is getting soaked pretty often. So is it okay to keep changing the bandage as often as I need to throughout the day and continue to just soak it twice a day? If you could call and let me know for sure, I will write it down. My number is [phone]. Thank you.

## 2017-09-09 NOTE — Progress Notes (Signed)
Patient came into the office this afternoon to get her bandage change.  Just 2 hours earlier she had a nail avulsion procedure done, and had blood through her bandage.  Surgical sites cleansed with sterile normal saline applied Neosporin, and sterile bandage.  She is to follow-up with any questions or concerns.

## 2017-09-11 ENCOUNTER — Encounter: Payer: Self-pay | Admitting: Podiatry

## 2017-09-16 ENCOUNTER — Other Ambulatory Visit: Payer: Medicare Other

## 2017-09-29 ENCOUNTER — Ambulatory Visit: Payer: Medicare Other

## 2017-09-29 ENCOUNTER — Other Ambulatory Visit: Payer: Self-pay | Admitting: *Deleted

## 2017-09-29 ENCOUNTER — Other Ambulatory Visit: Payer: Self-pay | Admitting: Podiatry

## 2017-09-29 MED ORDER — NEOMYCIN-POLYMYXIN-HC 1 % OT SOLN: OTIC | 1 refills | Status: DC

## 2017-09-29 NOTE — Telephone Encounter (Signed)
Sent Rx refill of cortisporin otic drops to pharmacy

## 2017-09-30 ENCOUNTER — Encounter: Payer: Self-pay | Admitting: Podiatry

## 2017-09-30 ENCOUNTER — Ambulatory Visit (INDEPENDENT_AMBULATORY_CARE_PROVIDER_SITE_OTHER): Payer: Medicare Other | Admitting: Podiatry

## 2017-09-30 DIAGNOSIS — M79676 Pain in unspecified toe(s): Secondary | ICD-10-CM | POA: Diagnosis not present

## 2017-09-30 DIAGNOSIS — B351 Tinea unguium: Secondary | ICD-10-CM | POA: Diagnosis not present

## 2017-10-01 ENCOUNTER — Ambulatory Visit: Payer: Medicare Other

## 2017-10-01 NOTE — Progress Notes (Signed)
She presents today for a routine nail debridement.  She is a follow-up of her nail avulsion hallux left.  She states that is doing just great.  Objective: Vital signs are stable she is alert and oriented x3 there is no erythema edema cellulitis drainage or odor hallux left.  She has been continuing to soak in Betadine as evidenced by her dark brown-yellow left foot.  Otherwise toenails are long thick yellow dystrophic and clinically mycotic.  Assessment: Well-healing surgical toe hallux left.  Otherwise painful elongated toenails with onychomycosis.  Plan: Debridement of toenails 1 through 5 bilateral.  I recommended she discontinue soaking in Betadine if she wants to soak she should soak in Epsom salts of water the toe does appear to be healed.

## 2017-10-08 ENCOUNTER — Telehealth: Payer: Self-pay | Admitting: Podiatry

## 2017-10-08 NOTE — Telephone Encounter (Signed)
I'm calling in regards to my mom who see's Dr. Milinda Pointer. She had a toenail removed a little over a month ago and when she saw him last week he told her she no longer to soak her foot. We're trying to find out if she still needs to put the drops on her toe at this point or is she done with that as well? If someone could call me back at [phone]. Thank you.

## 2017-10-08 NOTE — Telephone Encounter (Signed)
Left message informing pt's dtr, [name] that if pt has been instructed to stop the soaks she can also stop the drops.

## 2017-10-24 LAB — HM DIABETES EYE EXAM

## 2017-11-04 ENCOUNTER — Ambulatory Visit: Payer: Self-pay | Admitting: Gastroenterology

## 2017-11-05 LAB — HM MAMMOGRAPHY

## 2017-11-05 LAB — HM DEXA SCAN

## 2017-11-11 ENCOUNTER — Encounter: Payer: Self-pay | Admitting: Internal Medicine

## 2017-11-11 ENCOUNTER — Telehealth: Payer: Self-pay | Admitting: *Deleted

## 2017-11-11 NOTE — Telephone Encounter (Signed)
Received Bone Density results from [place]; forwarded to provider/SLS 11/07

## 2017-11-11 NOTE — Telephone Encounter (Signed)
Received Mammogram results from [place]; forwarded to provider/SLS 11/05  Received Bone Density results from [place]; forwarded to provider/SLS 11/05

## 2017-11-14 LAB — TSH: TSH: 0.52 (ref <=5.90)

## 2017-11-14 LAB — HM DIABETES FOOT EXAM

## 2017-11-14 LAB — HEMOGLOBIN A1C: Hemoglobin A1C: 5.6

## 2017-11-14 LAB — CBC AND DIFFERENTIAL
HCT: 36 (ref 36–46)
Hemoglobin: 12.3 (ref 12.0–16.0)
Platelets: 179 (ref 150–399)
WBC: 8.2

## 2017-11-14 LAB — VITAMIN B12: Vitamin B-12: 1166

## 2017-11-14 LAB — IRON,TIBC AND FERRITIN PANEL: Ferritin: 43.5

## 2017-12-02 ENCOUNTER — Ambulatory Visit (INDEPENDENT_AMBULATORY_CARE_PROVIDER_SITE_OTHER): Payer: Medicare Other | Admitting: Podiatry

## 2017-12-02 ENCOUNTER — Encounter: Payer: Self-pay | Admitting: Podiatry

## 2017-12-02 DIAGNOSIS — B351 Tinea unguium: Secondary | ICD-10-CM | POA: Diagnosis not present

## 2017-12-02 DIAGNOSIS — M79676 Pain in unspecified toe(s): Secondary | ICD-10-CM | POA: Diagnosis not present

## 2017-12-03 NOTE — Progress Notes (Signed)
She presents today chief complaint of painful elongated toenails bilaterally..  Objective: Toenails are long thick yellow dystrophic-like mycotic painful palpation.  No open lesions or wounds.  Pulses remain strong palpable.  Assessment: Pain in limb secondary to onychomycosis.  Plan: Debridement of toenails 1 through 5 bilateral.

## 2018-01-05 ENCOUNTER — Other Ambulatory Visit: Payer: Self-pay | Admitting: Internal Medicine

## 2018-01-16 ENCOUNTER — Other Ambulatory Visit: Payer: Self-pay | Admitting: Podiatry

## 2018-01-16 NOTE — Telephone Encounter (Signed)
I called pt and asked if she had requested a refill of the antibiotic drops for her toenails. Pt states she may have had some pain or it looked funny, but it was not now. I told pt that if she had toe pain or change of the procedure area she should call for an appt. Pt states understanding.

## 2018-01-21 ENCOUNTER — Ambulatory Visit (INDEPENDENT_AMBULATORY_CARE_PROVIDER_SITE_OTHER): Payer: Medicare Other | Admitting: Internal Medicine

## 2018-01-21 ENCOUNTER — Encounter: Payer: Self-pay | Admitting: Internal Medicine

## 2018-01-21 VITALS — BP 126/68 | HR 62 | Temp 98.2°F | Resp 16 | Ht 67.0 in | Wt 177.2 lb

## 2018-01-21 DIAGNOSIS — E222 Syndrome of inappropriate secretion of antidiuretic hormone: Secondary | ICD-10-CM | POA: Diagnosis not present

## 2018-01-21 DIAGNOSIS — E785 Hyperlipidemia, unspecified: Secondary | ICD-10-CM | POA: Diagnosis not present

## 2018-01-21 DIAGNOSIS — E118 Type 2 diabetes mellitus with unspecified complications: Secondary | ICD-10-CM

## 2018-01-21 DIAGNOSIS — E538 Deficiency of other specified B group vitamins: Secondary | ICD-10-CM

## 2018-01-21 DIAGNOSIS — Z0000 Encounter for general adult medical examination without abnormal findings: Secondary | ICD-10-CM | POA: Diagnosis not present

## 2018-01-21 DIAGNOSIS — F0390 Unspecified dementia without behavioral disturbance: Secondary | ICD-10-CM

## 2018-01-21 LAB — LIPID PANEL
Cholesterol: 130 mg/dL (ref 0–200)
HDL: 54.8 mg/dL (ref >39.00)
LDL Cholesterol: 39 mg/dL (ref 0–99)
NonHDL: 75.52
Total CHOL/HDL Ratio: 2
Triglycerides: 182 mg/dL — ABNORMAL HIGH (ref 0.0–149.0)
VLDL: 36.4 mg/dL (ref 0.0–40.0)

## 2018-01-21 LAB — COMPREHENSIVE METABOLIC PANEL
ALT: 19 U/L (ref 0–35)
AST: 21 U/L (ref 0–37)
Albumin: 4.5 g/dL (ref 3.5–5.2)
Alkaline Phosphatase: 51 U/L (ref 39–117)
BUN: 6 mg/dL (ref 6–23)
CO2: 27 mEq/L (ref 19–32)
Calcium: 9.4 mg/dL (ref 8.4–10.5)
Chloride: 102 mEq/L (ref 96–112)
Creatinine, Ser: 0.64 mg/dL (ref 0.40–1.20)
GFR: 96.7 mL/min (ref >60.00)
Glucose, Bld: 111 mg/dL — ABNORMAL HIGH (ref 70–99)
Potassium: 3.8 mEq/L (ref 3.5–5.1)
Sodium: 138 mEq/L (ref 135–145)
Total Bilirubin: 0.6 mg/dL (ref 0.2–1.2)
Total Protein: 7.2 g/dL (ref 6.0–8.3)

## 2018-01-21 LAB — VITAMIN B12: Vitamin B-12: 1169 pg/mL — ABNORMAL HIGH (ref 211–911)

## 2018-01-21 NOTE — Assessment & Plan Note (Addendum)
--  Td:  2014; pneumovax 2009-2014;  prevnar: 12-2013;  shingles shot 2010; had a Flu shot  S/p Shingrix #1, will get #2 soon -female care:  states saw gyn since last CPX Gynecology, no h/o abnormal paps; last  mammogram [id number] (-) -CCS: FH Colon cancer, Cscope : 04/18/2009, cscope again 04-2014: +polyps, 5 years per GI letter  -Labs:   CMP, FLP, b12 -Diet and exercise: Counseled.

## 2018-01-21 NOTE — Progress Notes (Signed)
Subjective:    Patient ID: [name], female    DOB: September 14, 1945, 73 y.o.   MRN: [id number]  DOS:  01/21/2018 Type of visit - description: Physical exam No major concerns   Review of Systems Loose stools for the last few days, yellow in color.  No watery stools, no fever chills.  No nausea, vomiting. Also nasal congestion, going on for a while, can't tell for how long.  Thinks it is allergies, no fever chills.   Other than above, a 14 point review of systems is negative    Past Medical History:  Diagnosis Date  . Adenomatous colon polyp 03/1988  . Anemia    borderline  . Arthritis    R shoulder - degenerative   . Cataract    beginning stages  . Chest pain     Sept 2011: stress test neg  . Depression    sees Dr.Cotle  . Diabetes mellitus    dr Buddy Duty  . Diverticulosis   . Fatty liver    Increased LFTs, saw GI 06/2011, likely from fatty liver   . GERD (gastroesophageal reflux disease)   . History of hiatal hernia   . Hyperlipemia   . Hypertension   . IBS (irritable bowel syndrome)    and dyspepsia  . Memory impairment 08/2014   mild cognitive impairment vs. mild dementia, reommended reinstating of cholinesterase inhibitor   . Osteopenia    dexa 06/2007 and 10/11 Rx CA vitamin d  . RLS (restless legs syndrome)    h/o- no longer using Requip    Past Surgical History:  Procedure Laterality Date  . BREAST BIOPSY Left 10/11/2015   fibroadenoma w/ calcifications, no evidence of malignancy, recommend mammogram repeat-6 mnths  . POLYPECTOMY    . REVERSE SHOULDER ARTHROPLASTY Right 04/21/2014   Procedure: REVERSE SHOULDER ARTHROPLASTY;  Surgeon: Tania Ade, MD;  Location: [facility];  Service: Orthopedics;  Laterality: Right;  Right reverse total shoulder replacement  . TONSILLECTOMY    . UTERINE FIBROID EMBOLIZATION     90s    Social History   Socioeconomic History  . Marital status: Widowed    Spouse name: [name]  . Number of children: 3  . Years of  education: Not on file  . Highest education level: Not on file  Occupational History  . Occupation: retired, was a Leisure centre manager)    Comment: RN  Social Needs  . Financial resource strain: Not on file  . Food insecurity:    Worry: Not on file    Inability: Not on file  . Transportation needs:    Medical: Not on file    Non-medical: Not on file  Tobacco Use  . Smoking status: Former Smoker    Packs/day: 2.00    Years: 10.00    Pack years: 20.00    Types: Cigarettes    Last attempt to quit: 11/26/1975    Years since quitting: 42.1  . Smokeless tobacco: Never Used  . Tobacco comment: used to smoke 2 ppd  Substance and Sexual Activity  . Alcohol use: Yes    Alcohol/week: 0.0 standard drinks    Comment: rarely  . Drug use: No  . Sexual activity: Not Currently  Lifestyle  . Physical activity:    Days per week: Not on file    Minutes per session: Not on file  . Stress: Not on file  Relationships  . Social connections:    Talks on phone: Not on file    Gets  together: Not on file    Attends religious service: Not on file    Active member of club or organization: Not on file    Attends meetings of clubs or organizations: Not on file    Relationship status: Not on file  . Intimate partner violence:    Fear of current or ex partner: Not on file    Emotionally abused: Not on file    Physically abused: Not on file    Forced sexual activity: Not on file  Other Topics Concern  . Not on file  Social History Narrative   Widow , lives by herself at [facility] in a 1 bedroom apt;  still drives, daughters [name]) live  in [place] in [place]     5 g-children     Family History  Problem Relation Age of Onset  . Lung cancer Mother        smoker  . Colon cancer Father        F dx in his 13s  . Ovarian cancer Paternal Aunt        ?  . Diabetes Other        aunts-uncles   . Stroke Other        aunts-uncles   . Heart attack Maternal Grandmother        60s    . Other Other        niece- glioblastoma  . Breast cancer Neg Hx   . Rectal cancer Neg Hx   . Stomach cancer Neg Hx   . Esophageal cancer Neg Hx      Allergies as of 01/21/2018      Reactions   Sulfa Antibiotics Nausea Only   Trulicity [dulaglutide] Nausea Only   Penicillins Rash, Other (See Comments)   Has patient had a PCN reaction causing immediate rash, facial/tongue/throat swelling, SOB or lightheadedness with hypotension: No Has patient had a PCN reaction causing severe rash involving mucus membranes or skin necrosis: No Has patient had a PCN reaction that required hospitalization No Has patient had a PCN reaction occurring within the last 10 years: No If all of the above answers are "NO", then may proceed with Cephalosporin use.      Medication List       Accurate as of January 21, 2018 11:59 PM. Always use your most recent med list.        ACCU-CHEK FASTCLIX LANCETS Misc daily. use as directed   acetaminophen 325 MG tablet Commonly known as:  TYLENOL Take 650 mg by mouth every 6 (six) hours as needed for mild pain or headache.   ARICEPT 10 MG tablet Generic drug:  donepezil Take 2 tablets (20 mg total) by mouth at bedtime.   aspirin 81 MG EC tablet Commonly known as:  ASPIRIN LOW DOSE Take 1 tablet (81 mg total) by mouth daily.   atorvastatin 40 MG tablet Commonly known as:  LIPITOR Take 1 tablet (40 mg total) by mouth daily.   azelastine 0.1 % nasal spray Commonly known as:  ASTELIN Place 2 sprays into both nostrils at bedtime as needed for rhinitis. Use in each nostril as directed   B-12 1000 MCG Tbcr Take 1 tablet by mouth daily.   BD VEO INSULIN SYRINGE U/F 31G X 15/64" 1 ML Misc Generic drug:  Insulin Syringe-Needle U-100 USE TO INJECT insulin EVERY DAY   CENTRUM SILVER 50+WOMEN Tabs Take 1 tablet by mouth daily.   clindamycin 300 MG capsule Commonly known as:  CLEOCIN TAKE 1 CAPSULE BY MOUTH 4 TIMES DAILY UNTIL GONE   Ferrous  Fumarate-Vitamin C ER 65-25 MG Tbcr Commonly known as:  FERRO-SEQUELS Take 1 tablet by mouth daily.   LEVEMIR FLEXTOUCH 100 UNIT/ML Pen Generic drug:  Insulin Detemir Inject 52 Units into the skin at bedtime.   losartan 25 MG tablet Commonly known as:  COZAAR Take 1 tablet (25 mg total) by mouth every evening.   memantine 10 MG tablet Commonly known as:  NAMENDA Take 10 mg by mouth 2 (two) times daily.   metFORMIN 1000 MG tablet Commonly known as:  GLUCOPHAGE Take 1,000 mg by mouth 2 (two) times daily.   ONE TOUCH ULTRA TEST test strip Generic drug:  glucose blood USE TO CHECK BLOOD SUGAR EVERY DAY   pantoprazole 40 MG tablet Commonly known as:  PROTONIX Take 1 tablet (40 mg total) by mouth 2 (two) times daily before a meal.   PREVIDENT 5000 DRY MOUTH 1.1 % Gel dental gel Generic drug:  sodium fluoride Place 1 application onto teeth at bedtime as needed (for teeth).   sertraline 100 MG tablet Commonly known as:  ZOLOFT TAKE 1 TABLET BY MOUTH EVERY DAY AND TAKE 1/2 TABLET AT BEDTIME   tiZANidine 2 MG tablet Commonly known as:  ZANAFLEX Take 2-4 mg by mouth 2 (two) times daily as needed.           Objective:   Physical Exam BP 126/68 (BP Location: Left Arm, Patient Position: Sitting, Cuff Size: Small)   Pulse 62   Temp 98.2 F (36.8 C) (Oral)   Resp 16   Ht 5\' 7"  (1.702 m)   Wt 177 lb 4 oz (80.4 kg)   SpO2 97%   BMI 27.76 kg/m  General: Well developed, NAD, BMI noted Neck: No  thyromegaly  HEENT:  Normocephalic . Face symmetric, atraumatic.  Nose congested  Lungs:  CTA B Normal respiratory effort, no intercostal retractions, no accessory muscle use. Heart: RRR,  no murmur.  No pretibial edema bilaterally  Abdomen:  Not distended, soft, non-tender. No rebound or rigidity.   Skin: Exposed areas without rash. Not pale. Not jaundice Neurologic:  alert & oriented to self, time, place.   Speech normal, gait appropriate for age and unassisted Strength  symmetric and appropriate for age.  Psych: Cognition and judgment appear intact.  Cooperative with normal attention span and concentration.  Behavior appropriate. No anxious or depressed appearing.     Assessment     Assessment  DM Dr. Buddy Duty HTN Hyperlipidemia H/o SIADH Depression used to see Dr. Clovis Pu Mild cognitive impairment  MMSE 2015 --> 28, on Aricept, namenda added 04-2016 sees Dr Everette Rank  OSA -- mild, also told RLS (Rx requip); saw Dr Gwenette Greet 2014, no CPAP GI: --IBS, colon polyps, diverticulosis, hiatal hernia --Chronic constipation likely part of her IBS syndrome  --Fatty liver >>> GI eval 2013 --iron fec anemia:  cscope 04-2014 : 2 polyps; + hemocult @ GI office 10-2014: EGD done (-), bx neg Osteopenia: T score -1.1  2009 , osteopenia again per dexa 05-2013, t score -1.8 (10-09-17), scanned, rx ca-vit d B12 deficiency  RLS MSK: --DJD frozen shoulder Dizziness: chronic, carotid US 2016 neg, saw cards-- not likely CV related; saw neuro DR Everette Rank 02-2015  Chest pain 09-2009, negative stress test Thyroid nodules: Incidental   by Carotid US, BX 11-2014: Atypical findings, Bethesda III, f/u  Endocrine RLL Lung nodule: Stable per CT 09-2016, no need for further  PLAN:  DM, thyroid  disease: sees Endo, per K PN TSH was 0.5 in November 2019, A1c 5.8. HTN: Seems well controlled on losartan, checking labs, used to be on atenolol, that medication is not on her medication package.  BP today very good.  Continue with only losartan.   Hyperlipidemia: On Lipitor, check FLP Cognitive impairment: Per neurology, on Aricept and Namenda.  Oriented x3 today. Osteopenia: t score -1.8 (10-09-17), scanned, recommend calcium vitamin D.   B12 deficiency: Reports she is only taking multivitamin, checking levels Allergic rhinitis: Chronic nasal congestion.  No consistent use of nasal spray, again recommend Astelin and Flonase. Social: Lives of friend's home, Jenny Reichmann (an Environmental consultant)  who is here today with  the patient, visit her once a week on Wednesday. RTC 6  Months

## 2018-01-21 NOTE — Patient Instructions (Addendum)
Please schedule Medicare Wellness with Angel.   GO TO THE LAB : Get the blood work     GO TO THE FRONT DESK Schedule your next appointment   for a checkup in 6 months    

## 2018-01-21 NOTE — Progress Notes (Signed)
Pre visit review using our clinic review tool, if applicable. No additional management support is needed unless otherwise documented below in the visit note. 

## 2018-01-22 ENCOUNTER — Encounter: Payer: Self-pay | Admitting: Internal Medicine

## 2018-01-22 NOTE — Assessment & Plan Note (Signed)
DM, thyroid disease: sees Endo, per K PN TSH was 0.5 in November 2019, A1c 5.8. HTN: Seems well controlled on losartan, checking labs, used to be on atenolol, that medication is not on her medication package.  BP today very good.  Continue with only losartan.   Hyperlipidemia: On Lipitor, check FLP Cognitive impairment: Per neurology, on Aricept and Namenda.  Oriented x3 today. Osteopenia: t score -1.8 (10-09-17), scanned, recommend calcium vitamin D.   B12 deficiency: Reports she is only taking multivitamin, checking levels Allergic rhinitis: Chronic nasal congestion.  No consistent use of nasal spray, again recommend Astelin and Flonase. Social: Lives of friend's home, Jenny Reichmann (an Environmental consultant)  who is here today with the patient, visit her once a week on Wednesday. RTC 6  Months

## 2018-01-29 ENCOUNTER — Encounter: Payer: Self-pay | Admitting: Internal Medicine

## 2018-02-02 ENCOUNTER — Other Ambulatory Visit: Payer: Self-pay | Admitting: Internal Medicine

## 2018-02-05 ENCOUNTER — Ambulatory Visit (INDEPENDENT_AMBULATORY_CARE_PROVIDER_SITE_OTHER): Payer: Medicare Other | Admitting: Podiatry

## 2018-02-05 ENCOUNTER — Encounter: Payer: Self-pay | Admitting: Podiatry

## 2018-02-05 DIAGNOSIS — Q828 Other specified congenital malformations of skin: Secondary | ICD-10-CM | POA: Diagnosis not present

## 2018-02-05 DIAGNOSIS — B351 Tinea unguium: Secondary | ICD-10-CM | POA: Diagnosis not present

## 2018-02-05 DIAGNOSIS — M79676 Pain in unspecified toe(s): Secondary | ICD-10-CM

## 2018-02-05 NOTE — Progress Notes (Signed)
She presents today chief complaint of painful elongated toenails and multiple calluses to the plantar aspect of the bilateral foot.  Objective: Pulses remain palpable.  Toenails are long thick yellow dystrophic with mycotic.  No open lesions or wounds.  She has reactive hyperkeratotic lesions to the plantar aspect of the bilateral foot.  Assessment: Pain in limb secondary to onychomycosis and porokeratosis.  Plan: Debridement of all reactive hyperkeratotic tissue debridement of toenails 1 through 5 bilateral.

## 2018-02-26 ENCOUNTER — Ambulatory Visit (INDEPENDENT_AMBULATORY_CARE_PROVIDER_SITE_OTHER): Payer: Medicare Other | Admitting: Internal Medicine

## 2018-02-26 ENCOUNTER — Encounter: Payer: Self-pay | Admitting: Internal Medicine

## 2018-02-26 VITALS — BP 126/66 | HR 68 | Temp 98.0°F | Resp 16 | Ht 67.0 in | Wt 175.2 lb

## 2018-02-26 DIAGNOSIS — R399 Unspecified symptoms and signs involving the genitourinary system: Secondary | ICD-10-CM | POA: Diagnosis not present

## 2018-02-26 DIAGNOSIS — R159 Full incontinence of feces: Secondary | ICD-10-CM

## 2018-02-26 DIAGNOSIS — R32 Unspecified urinary incontinence: Secondary | ICD-10-CM | POA: Diagnosis not present

## 2018-02-26 LAB — POC URINALSYSI DIPSTICK (AUTOMATED)
Bilirubin, UA: NEGATIVE
Glucose, UA: NEGATIVE
Ketones, UA: NEGATIVE
Nitrite, UA: NEGATIVE
Protein, UA: POSITIVE — AB
Spec Grav, UA: 1.02 (ref 1.010–1.025)
Urobilinogen, UA: 0.2 E.U./dL
pH, UA: 6 (ref 5.0–8.0)

## 2018-02-26 MED ORDER — NITROFURANTOIN MONOHYD MACRO 100 MG PO CAPS: 100.0000 mg | ORAL_CAPSULE | Freq: Two times a day (BID) | ORAL | 0 refills | Status: DC

## 2018-02-26 NOTE — Progress Notes (Signed)
Subjective:    Patient ID: [name], female    DOB: 1945/01/26, 73 y.o.   MRN: [id number]  DOS:  02/26/2018 Type of visit - description: acute Here because yesterday she developed severe dysuria. She has a long history of bladder and bowel incontinence, 3 days ago, she had a uncontrolled bowel movement and she thinks the stools reached her genital area, wonders if that is the trigger for her UTI type of symptoms. Request a referral for possible pelvic PT which helped in the past.   Review of Systems Denies fever chills No nausea or vomiting No flank or abdominal pain  Past Medical History:  Diagnosis Date  . Adenomatous colon polyp 03/1988  . Anemia    borderline  . Arthritis    R shoulder - degenerative   . Cataract    beginning stages  . Chest pain     Sept 2011: stress test neg  . Depression    sees Dr.Cotle  . Diabetes mellitus    dr Buddy Duty  . Diverticulosis   . Fatty liver    Increased LFTs, saw GI 06/2011, likely from fatty liver   . GERD (gastroesophageal reflux disease)   . History of hiatal hernia   . Hyperlipemia   . Hypertension   . IBS (irritable bowel syndrome)    and dyspepsia  . Memory impairment 08/2014   mild cognitive impairment vs. mild dementia, reommended reinstating of cholinesterase inhibitor   . Osteopenia    dexa 06/2007 and 10/11 Rx CA vitamin d  . RLS (restless legs syndrome)    h/o- no longer using Requip    Past Surgical History:  Procedure Laterality Date  . BREAST BIOPSY Left 10/11/2015   fibroadenoma w/ calcifications, no evidence of malignancy, recommend mammogram repeat-6 mnths  . POLYPECTOMY    . REVERSE SHOULDER ARTHROPLASTY Right 04/21/2014   Procedure: REVERSE SHOULDER ARTHROPLASTY;  Surgeon: Tania Ade, MD;  Location: [facility];  Service: Orthopedics;  Laterality: Right;  Right reverse total shoulder replacement  . TONSILLECTOMY    . UTERINE FIBROID EMBOLIZATION     90s    Social History   Socioeconomic History    . Marital status: Widowed    Spouse name: [name]  . Number of children: 3  . Years of education: Not on file  . Highest education level: Not on file  Occupational History  . Occupation: retired, was a Leisure centre manager)    Comment: RN  Social Needs  . Financial resource strain: Not on file  . Food insecurity:    Worry: Not on file    Inability: Not on file  . Transportation needs:    Medical: Not on file    Non-medical: Not on file  Tobacco Use  . Smoking status: Former Smoker    Packs/day: 2.00    Years: 10.00    Pack years: 20.00    Types: Cigarettes    Last attempt to quit: 11/26/1975    Years since quitting: 42.2  . Smokeless tobacco: Never Used  . Tobacco comment: used to smoke 2 ppd  Substance and Sexual Activity  . Alcohol use: Yes    Alcohol/week: 0.0 standard drinks    Comment: rarely  . Drug use: No  . Sexual activity: Not Currently  Lifestyle  . Physical activity:    Days per week: Not on file    Minutes per session: Not on file  . Stress: Not on file  Relationships  . Social connections:  Talks on phone: Not on file    Gets together: Not on file    Attends religious service: Not on file    Active member of club or organization: Not on file    Attends meetings of clubs or organizations: Not on file    Relationship status: Not on file  . Intimate partner violence:    Fear of current or ex partner: Not on file    Emotionally abused: Not on file    Physically abused: Not on file    Forced sexual activity: Not on file  Other Topics Concern  . Not on file  Social History Narrative   Widow , lives by herself at [facility] in a 1 bedroom apt;  still drives, daughters [name]) live  in [place] in [place]     5 g-children      Allergies as of 02/26/2018      Reactions   Sulfa Antibiotics Nausea Only   Trulicity [dulaglutide] Nausea Only   Penicillins Rash, Other (See Comments)   Has patient had a PCN reaction causing immediate rash,  facial/tongue/throat swelling, SOB or lightheadedness with hypotension: No Has patient had a PCN reaction causing severe rash involving mucus membranes or skin necrosis: No Has patient had a PCN reaction that required hospitalization No Has patient had a PCN reaction occurring within the last 10 years: No If all of the above answers are "NO", then may proceed with Cephalosporin use.      Medication List       Accurate as of February 26, 2018  1:09 PM. Always use your most recent med list.        ACCU-CHEK FASTCLIX LANCETS Misc daily. use as directed   acetaminophen 325 MG tablet Commonly known as:  TYLENOL Take 650 mg by mouth every 6 (six) hours as needed for mild pain or headache.   ARICEPT 10 MG tablet Generic drug:  donepezil Take 2 tablets (20 mg total) by mouth at bedtime.   aspirin 81 MG EC tablet Commonly known as:  ASPIRIN LOW DOSE Take 1 tablet (81 mg total) by mouth daily.   atorvastatin 40 MG tablet Commonly known as:  LIPITOR Take 1 tablet (40 mg total) by mouth daily.   azelastine 0.1 % nasal spray Commonly known as:  ASTELIN Place 2 sprays into both nostrils at bedtime as needed for rhinitis. Use in each nostril as directed   B-12 1000 MCG Tbcr Take 1 tablet by mouth daily.   BD VEO INSULIN SYRINGE U/F 31G X 15/64" 1 ML Misc Generic drug:  Insulin Syringe-Needle U-100 USE TO INJECT insulin EVERY DAY   CENTRUM SILVER 50+WOMEN Tabs Take 1 tablet by mouth daily.   Ferrous Fumarate-Vitamin C ER 65-25 MG Tbcr Commonly known as:  FERRO-SEQUELS Take 1 tablet by mouth daily.   LEVEMIR FLEXTOUCH 100 UNIT/ML Pen Generic drug:  Insulin Detemir Inject 52 Units into the skin at bedtime.   losartan 25 MG tablet Commonly known as:  COZAAR Take 1 tablet (25 mg total) by mouth every evening.   memantine 10 MG tablet Commonly known as:  NAMENDA Take 10 mg by mouth 2 (two) times daily.   metFORMIN 1000 MG tablet Commonly known as:  GLUCOPHAGE Take 1,000 mg  by mouth 2 (two) times daily.   neomycin-polymyxin-hydrocortisone OTIC solution Commonly known as:  CORTISPORIN APPLY 1 TO 2 DROPS TO toe 2 TIMES DAILY AFTER SOAKING.   ONE TOUCH ULTRA TEST test strip Generic drug:  glucose blood USE TO CHECK BLOOD SUGAR EVERY DAY   pantoprazole 40 MG tablet Commonly known as:  PROTONIX Take 1 tablet (40 mg total) by mouth 2 (two) times daily before a meal.   PREVIDENT 5000 DRY MOUTH 1.1 % Gel dental gel Generic drug:  sodium fluoride Place 1 application onto teeth at bedtime as needed (for teeth).   sertraline 100 MG tablet Commonly known as:  ZOLOFT TAKE 1 TABLET BY MOUTH EVERY DAY AND TAKE 1/2 TABLET AT BEDTIME   tiZANidine 2 MG tablet Commonly known as:  ZANAFLEX Take 2-4 mg by mouth 2 (two) times daily as needed.           Objective:   Physical Exam BP 126/66 (BP Location: Left Arm, Patient Position: Sitting, Cuff Size: Small)   Pulse 68   Temp 98 F (36.7 C) (Oral)   Resp 16   Ht 5\' 7"  (1.702 m)   Wt 175 lb 4 oz (79.5 kg)   SpO2 98%   BMI 27.45 kg/m  General:   Well developed, NAD, BMI noted.  HEENT:  Normocephalic . Face symmetric, atraumatic  Abdomen:  Not distended, soft, non-tender. No rebound or rigidity.  No CVA tenderness. Skin: Not pale. Not jaundice Neurologic:  alert & oriented X3.  Speech normal, gait assisted by a cane, at baseline, needs help to transfer  Psych--  Cognition and judgment appear intact.  Cooperative with normal attention span and concentration.  Behavior appropriate. No anxious or depressed appearing.     Assessment    Assessment  DM Dr. Buddy Duty HTN Hyperlipidemia H/o SIADH Depression used to see Dr. Clovis Pu Mild cognitive impairment  MMSE 2015 --> 28, on Aricept, namenda added 04-2016 sees Dr Everette Rank  OSA -- mild, also told RLS (Rx requip); saw Dr Gwenette Greet 2014, no CPAP GI: --IBS, colon polyps, diverticulosis, hiatal hernia --Chronic constipation likely part of her IBS syndrome    --Fatty liver >>> GI eval 2013 --iron fec anemia:  cscope 04-2014 : 2 polyps; + hemocult @ GI office 10-2014: EGD done (-), bx neg Osteopenia: T score -1.1  2009 , osteopenia again per dexa 05-2013, t score -1.8 (10-09-17), scanned, rx ca-vit d B12 deficiency  RLS MSK: --DJD frozen shoulder Dizziness: chronic, carotid US 2016 neg, saw cards-- not likely CV related; saw neuro DR Everette Rank 02-2015  Chest pain 09-2009, negative stress test Thyroid nodules: Incidental   by Carotid US, BX 11-2014: Atypical findings, Bethesda III, f/u  Endocrine RLL Lung nodule: Stable per CT 09-2016, no need for further B/B incontinence:  uses diapers  PLAN:  LUTS: Likely acute cystitis.  U dip + for leukocyte, not enough to run a formal UA, will send a urine culture.  Send a prescription for Macrobid.  Drink plenty fluids, call if not better. Bladder and bowel incontinence: Long history of those problems, uses depends, previously Keagle exercises help, would like to see PT again.  Referral sent  Social: Lives at [facility]

## 2018-02-26 NOTE — Patient Instructions (Signed)
Drink plenty fluids Take antibiotic for 3 days Call if not gradually better

## 2018-02-26 NOTE — Progress Notes (Signed)
Pre visit review using our clinic review tool, if applicable. No additional management support is needed unless otherwise documented below in the visit note. 

## 2018-02-28 LAB — URINE CULTURE
MICRO NUMBER:: 221395
SPECIMEN QUALITY:: ADEQUATE

## 2018-03-01 NOTE — Assessment & Plan Note (Signed)
LUTS: Likely acute cystitis.  U dip + for leukocyte, not enough to run a formal UA, will send a urine culture.  Send a prescription for Macrobid.  Drink plenty fluids, call if not better. Bladder and bowel incontinence: Long history of those problems, uses depends, previously Keagle exercises help, would like to see PT again.  Referral sent  Social: Lives at [facility]

## 2018-03-09 ENCOUNTER — Other Ambulatory Visit: Payer: Self-pay

## 2018-03-09 ENCOUNTER — Ambulatory Visit: Payer: Medicare Other | Attending: Internal Medicine | Admitting: Physical Therapy

## 2018-03-09 ENCOUNTER — Encounter: Payer: Self-pay | Admitting: Physical Therapy

## 2018-03-09 DIAGNOSIS — M6281 Muscle weakness (generalized): Secondary | ICD-10-CM | POA: Insufficient documentation

## 2018-03-09 DIAGNOSIS — R279 Unspecified lack of coordination: Secondary | ICD-10-CM | POA: Insufficient documentation

## 2018-03-09 NOTE — Therapy (Signed)
[facility] [address], [place], [place], [address] Phone: [phone]   Fax:  [phone]  Physical Therapy Evaluation  Patient Details  Name: [name] MRN: [id number] Date of Birth: 08-25-1945 Referring Provider (PT): R32,R15.9 (ICD-10-CM) - Bowel and bladder incontinence   Encounter Date: 03/09/2018  PT End of Session - 03/09/18 1400    Visit Number  1    Date for PT Re-Evaluation  04/20/18    PT Start Time  1100    PT Stop Time  1143    PT Time Calculation (min)  43 min    Activity Tolerance  Patient tolerated treatment well    Behavior During Therapy  [facility] for tasks assessed/performed       Past Medical History:  Diagnosis Date  . Adenomatous colon polyp 03/1988  . Anemia    borderline  . Arthritis    R shoulder - degenerative   . Cataract    beginning stages  . Chest pain     Sept 2011: stress test neg  . Depression    sees Dr.Cotle  . Diabetes mellitus    dr Buddy Duty  . Diverticulosis   . Fatty liver    Increased LFTs, saw GI 06/2011, likely from fatty liver   . GERD (gastroesophageal reflux disease)   . History of hiatal hernia   . Hyperlipemia   . Hypertension   . IBS (irritable bowel syndrome)    and dyspepsia  . Memory impairment 08/2014   mild cognitive impairment vs. mild dementia, reommended reinstating of cholinesterase inhibitor   . Osteopenia    dexa 06/2007 and 10/11 Rx CA vitamin d  . RLS (restless legs syndrome)    h/o- no longer using Requip    Past Surgical History:  Procedure Laterality Date  . BREAST BIOPSY Left 10/11/2015   fibroadenoma w/ calcifications, no evidence of malignancy, recommend mammogram repeat-6 mnths  . POLYPECTOMY    . REVERSE SHOULDER ARTHROPLASTY Right 04/21/2014   Procedure: REVERSE SHOULDER ARTHROPLASTY;  Surgeon: Tania Ade, MD;  Location: [facility];  Service: Orthopedics;  Laterality: Right;  Right reverse total shoulder replacement  .  TONSILLECTOMY    . UTERINE FIBROID EMBOLIZATION     90s    There were no vitals filed for this visit.   Subjective Assessment - 03/09/18 1104    Subjective  Pt states she had this issue a couple of years ago but got better with PT.  Now it has come back and she needs to get retrained to do the exercises. Pt has bowel leakage every day and has strong urge and has at most a minute to get to the bathroom.  Bladder leakage is pretty constant little drip. Denies nocturia    Patient Stated Goals  stop having leakage     Currently in Pain?  No/denies         [facility] PT Assessment - 03/09/18 0001      Assessment   Medical Diagnosis  Colon Branch, MD    Referring Provider (PT)  [phone] (ICD-10-CM) - Bowel and bladder incontinence    Onset Date/Surgical Date  --   a couple of years, recently worse   Prior Therapy  Yes      Precautions   Precautions  None      Restrictions   Weight Bearing Restrictions  No      Balance Screen   Has the patient fallen in the past 6 months  Yes  How many times?  2    Has the patient had a decrease in activity level because of a fear of falling?   No    Is the patient reluctant to leave their home because of a fear of falling?   No      Home Film/video editor residence   retirement community   Living Arrangements  Alone      Prior Function   Level of [place]  Retired    Leisure  activities during the day      Cognition   Overall Cognitive Status  Within Functional Limits for tasks assessed      Posture/Postural Control   Posture/Postural Control  Postural limitations    Postural Limitations  Flexed trunk      ROM / Strength   AROM / PROM / Strength  Strength;PROM      PROM   Overall PROM Comments  WFL      Strength   Overall Strength Comments  hip flex and adduction 4-/5 bilat      Flexibility   Soft Tissue Assessment /Muscle Length  yes    Hamstrings  90%      Ambulation/Gait    Gait Pattern  Trunk flexed;Wide base of support                Objective measurements completed on examination: See above findings.    Pelvic Floor Special Questions - 03/09/18 0001    Prior Pelvic/Prostate Exam  Yes    Are you Pregnant or attempting pregnancy?  No    Urinary Leakage  Yes    How often  all day    Pad use  yes    Urinary frequency  just in case    Fecal incontinence  Yes   every day   Skin Integrity  Intact    Pelvic Floor Internal Exam  pt identified and informed consent given to perform internal soft tissue assessment    Exam Type  Vaginal    Strength  weak squeeze, no lift    Strength # of seconds  1    Tone  low       [facility] Adult PT Treatment/Exercise - 03/09/18 0001      Self-Care   Self-Care  Other Self-Care Comments    Other Self-Care Comments   urge to void techniques      Neuro Re-ed    Neuro Re-ed Details   how to exhale with kegel             PT Education - 03/09/18 1726    Education Details  urge to void    Person(s) Educated  Patient    Methods  Explanation;Handout    Comprehension  Verbalized understanding          PT Long Term Goals - 03/09/18 1401      PT LONG TERM GOAL #1   Title  Pt will report at least 50% less urinary and fecal leakage     Baseline  urinary all day, fecal 1x/day    Time  8    Period  Weeks    Status  New    Target Date  04/20/18      PT LONG TERM GOAL #2   Title  pt will be ind with advanced HEP    Time  8    Period  Weeks    Status  New    Target  Date  04/20/18      PT LONG TERM GOAL #3   Title  Pt will demonstrate 4+/5 MMT bil hip adduction for improved activation of pelvic floor in standing    Time  8    Period  Weeks    Status  New    Target Date  04/20/18      PT LONG TERM GOAL #4   Title  Pt will demonstrate 5x sit to stand in <13 seconds for reduced risk of falls    Time  8    Period  Weeks    Status  New    Target Date  04/20/18             Plan -  03/09/18 1445    Clinical Impression Statement  Pt presents to skilled PT due to incontinence.  She has had this problem before and had PT but she has gotten weaker and it has recently gotten worse.  Pt has LE weakness and flexed posture.  Pt has weakness of pelvic floor 2/5 MMT.  She had some improvement of circular contraction with tapping of muscle. She also demonstrates balance impairments with 18 sec of 5xsit to stand which places her at increased fall risk.  She has fallen at least 2x in the last 6 months. Pt will benefit from skilled PT to address impairments listed so she can return to maximum function and recreational activities.    Personal Factors and Comorbidities  Age    Examination-Activity Limitations  Toileting;Other   recreational activities due to toileting/leakage   Examination-Participation Restrictions  Community Activity    Stability/Clinical Decision Making  Evolving/Moderate complexity    Clinical Decision Making  Moderate    Rehab Potential  Good    PT Frequency  2x / week    PT Duration  6 weeks    PT Treatment/Interventions  ADLs/Self Care Home Management;Biofeedback;Dry needling;Manual techniques;Neuromuscular re-education;Therapeutic exercise;Therapeutic activities;Patient/family education    PT Next Visit Plan  biofeedback, coordinating breathing with kegel, f/u on urge to void    PT Home Exercise Plan  breathing with kegel and urge to void    Recommended Other Services  eval 03/09/18    Consulted and Agree with Plan of Care  Patient       Patient will benefit from skilled therapeutic intervention in order to improve the following deficits and impairments:  Abnormal gait, Postural dysfunction, Decreased strength, Decreased coordination  Visit Diagnosis: Muscle weakness (generalized)  Unspecified lack of coordination     Problem List Patient Active Problem List   Diagnosis Date Noted  . Dark stools 03/17/2017  . SIADH (syndrome of inappropriate ADH  production) ([place]) 10/10/2016  . Gastroesophageal reflux disease 08/06/2016  . PCP Notes >>>>>>>>>>>>>>>>> 09/21/2014  . Rotator cuff tear arthropathy 04/21/2014  . Abnormal MRI, shoulder 12/01/2013  . 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 Cervical radiculitis 11/10/2013  . Bursitis of right shoulder 08/19/2013  . Primary localized osteoarthrosis, lower leg 08/19/2013  . Vitamin B 12 deficiency 05/20/2013  . OSA (obstructive sleep apnea) 02/13/2012  . Pulmonary nodule, last  CT 2016, stable, no f/u 01/28/2012  . Dementia ([place]) 05/29/2011  . Fatty liver 02/27/2011  . Annual physical exam 11/26/2010  . Osteopenia 10/09/2007  . COLONIC POLYPS 08/22/2006  . DM II (diabetes mellitus, type II), controlled ([place]) 05/19/2006  . Hyperlipidemia 05/19/2006  . Depression 05/19/2006  . SYNDROME, RESTLESS LEGS 05/19/2006  . Essential hypertension 05/19/2006  . IBS -- constipation 05/19/2006  Jule Ser, PT 03/09/2018, 5:45 PM  [facility] [address], [place], [place], [address] Phone: [phone]   Fax:  [phone]  Name: [name] MRN: [id number] Date of Birth: 07-Nov-1945

## 2018-03-09 NOTE — Patient Instructions (Signed)
Relaxation Exercises with the Urge to Void   When you experience an urge to void:  FIRST  Stop and stand very still    Sit down if you can    Don't move    You need to stay very still to maintain control  SECOND Squeeze your pelvic floor muscles 5 times, like a quick flick, to keep from leaking  THIRD Relax  Take a deep breath and then let it out  Try to make the urge go away by using relaxation and visualization techniques  FINALLY When you feel the urge go away somewhat, walk normally to the bathroom.   If the urge gets suddenly stronger on the way, you may stop again and relax to regain control.  [facility] [address], [address] [place], [place] [address] Phone # [phone] Fax [phone]  

## 2018-03-10 ENCOUNTER — Ambulatory Visit (HOSPITAL_BASED_OUTPATIENT_CLINIC_OR_DEPARTMENT_OTHER)
Admission: RE | Admit: 2018-03-10 | Discharge: 2018-03-10 | Disposition: A | Discharge status: Home / Self Care | Payer: Medicare Other | Source: Ambulatory Visit | Attending: Medical | Admitting: Medical

## 2018-03-10 ENCOUNTER — Encounter: Payer: Self-pay | Admitting: Medical

## 2018-03-10 ENCOUNTER — Ambulatory Visit (INDEPENDENT_AMBULATORY_CARE_PROVIDER_SITE_OTHER): Payer: Medicare Other | Admitting: Medical

## 2018-03-10 VITALS — BP 100/84 | HR 59 | Temp 98.3°F | Resp 16 | Ht 67.0 in | Wt 180.2 lb

## 2018-03-10 DIAGNOSIS — M79671 Pain in right foot: Secondary | ICD-10-CM

## 2018-03-10 DIAGNOSIS — L089 Local infection of the skin and subcutaneous tissue, unspecified: Secondary | ICD-10-CM

## 2018-03-10 DIAGNOSIS — E118 Type 2 diabetes mellitus with unspecified complications: Secondary | ICD-10-CM

## 2018-03-10 MED ORDER — DOXYCYCLINE HYCLATE 100 MG PO TABS: 100.0000 mg | ORAL_TABLET | Freq: Two times a day (BID) | ORAL | 0 refills | Status: DC

## 2018-03-10 NOTE — Patient Instructions (Signed)
Your recent right foot pain with redness and bruising to second toe.  Appears may have suffered possible injury but no recollection of any particular injury or trauma.  Pain that is present today and the redness causes concern for possible skin infection.  With history of diabetes would recommend we proceed with caution.  Placed x-ray order of foot to be done today.  Also will get CBC, A1c and uric acid.  Prescription of doxycycline antibiotic for possible infection given today.  Rx advisement given regarding not using doxycycline on empty stomach.  Advised immediate medium snack or full meal before tablet.   We will follow lab results and imaging results and notify you.  Follow-up this coming Friday or sooner if needed.  If any  redness expanding foot before Friday then recommend ED evaluation.

## 2018-03-10 NOTE — Progress Notes (Signed)
Subjective:    Patient ID: [name], female    DOB: [birth year], 73 y.o.   MRN: [id number]  HPI  Pt in with rt 2nd toe appearance with partial bruised and red appearance. Pt states she first saw this last night at 7:40 pm. Pt was out on her balcony with no shoes/bare feet. No trauma or stubbed area. Pt states no itching or burning that is obvious. She does report history of neuropathy. No excess exercise. No new shoes. No left foot discomfort. Pt speculates that she may have been bit by something.  Pt is diabetic. Last a1-c in November was 5.6.   Review of Systems  Constitutional: Negative for chills, fatigue and fever.  Respiratory: Negative for cough, chest tightness, shortness of breath and wheezing.   Cardiovascular: Negative for chest pain and palpitations.  Gastrointestinal: Negative for abdominal pain, constipation and diarrhea.  Musculoskeletal: Negative for back pain.  Skin:       Rt second toe discolored.   Neurological: Negative for dizziness and headaches.  Hematological: Negative for adenopathy. Does not bruise/bleed easily.  Psychiatric/Behavioral: The patient is not nervous/anxious.      Past Medical History:  Diagnosis Date  . Adenomatous colon polyp 03/1988  . Anemia    borderline  . Arthritis    R shoulder - degenerative   . Cataract    beginning stages  . Chest pain     Sept 2011: stress test neg  . Depression    sees Dr.Cotle  . Diabetes mellitus    dr Buddy Duty  . Diverticulosis   . Fatty liver    Increased LFTs, saw GI 06/2011, likely from fatty liver   . GERD (gastroesophageal reflux disease)   . History of hiatal hernia   . Hyperlipemia   . Hypertension   . IBS (irritable bowel syndrome)    and dyspepsia  . Memory impairment 08/2014   mild cognitive impairment vs. mild dementia, reommended reinstating of cholinesterase inhibitor   . Osteopenia    dexa 06/2007 and 10/11 Rx CA vitamin d  . RLS (restless legs syndrome)    h/o- no longer using  Requip     Social History   Socioeconomic History  . Marital status: Widowed    Spouse name: [name]  . Number of children: 3  . Years of education: Not on file  . Highest education level: Not on file  Occupational History  . Occupation: retired, was a Leisure centre manager)    Comment: RN  Social Needs  . Financial resource strain: Not on file  . Food insecurity:    Worry: Not on file    Inability: Not on file  . Transportation needs:    Medical: Not on file    Non-medical: Not on file  Tobacco Use  . Smoking status: Former Smoker    Packs/day: 2.00    Years: 10.00    Pack years: 20.00    Types: Cigarettes    Last attempt to quit: 11/26/1975    Years since quitting: 42.3  . Smokeless tobacco: Never Used  . Tobacco comment: used to smoke 2 ppd  Substance and Sexual Activity  . Alcohol use: Yes    Alcohol/week: 0.0 standard drinks    Comment: rarely  . Drug use: No  . Sexual activity: Not Currently  Lifestyle  . Physical activity:    Days per week: Not on file    Minutes per session: Not on file  . Stress: Not on file  Relationships  . Social connections:    Talks on phone: Not on file    Gets together: Not on file    Attends religious service: Not on file    Active member of club or organization: Not on file    Attends meetings of clubs or organizations: Not on file    Relationship status: Not on file  . Intimate partner violence:    Fear of current or ex partner: Not on file    Emotionally abused: Not on file    Physically abused: Not on file    Forced sexual activity: Not on file  Other Topics Concern  . Not on file  Social History Narrative   Widow , lives by herself at [facility] in a 1 bedroom apt;  still drives, daughters [name]) live  in [place] in [place]     5 g-children    Past Surgical History:  Procedure Laterality Date  . BREAST BIOPSY Left 10/11/2015   fibroadenoma w/ calcifications, no evidence of malignancy, recommend  mammogram repeat-6 mnths  . POLYPECTOMY    . REVERSE SHOULDER ARTHROPLASTY Right 04/21/2014   Procedure: REVERSE SHOULDER ARTHROPLASTY;  Surgeon: Tania Ade, MD;  Location: [facility];  Service: Orthopedics;  Laterality: Right;  Right reverse total shoulder replacement  . TONSILLECTOMY    . UTERINE FIBROID EMBOLIZATION     90s    Family History  Problem Relation Age of Onset  . Lung cancer Mother        smoker  . Colon cancer Father        F dx in his 67s  . Ovarian cancer Paternal Aunt        ?  . Diabetes Other        aunts-uncles   . Stroke Other        aunts-uncles   . Heart attack Maternal Grandmother        60s  . Other Other        niece- glioblastoma  . Breast cancer Neg Hx   . Rectal cancer Neg Hx   . Stomach cancer Neg Hx   . Esophageal cancer Neg Hx     Allergies  Allergen Reactions  . Sulfa Antibiotics Nausea Only  . Trulicity [Dulaglutide] Nausea Only  . Penicillins Rash and Other (See Comments)    Has patient had a PCN reaction causing immediate rash, facial/tongue/throat swelling, SOB or lightheadedness with hypotension: No Has patient had a PCN reaction causing severe rash involving mucus membranes or skin necrosis: No Has patient had a PCN reaction that required hospitalization No Has patient had a PCN reaction occurring within the last 10 years: No If all of the above answers are "NO", then may proceed with Cephalosporin use.    Current Outpatient Medications on File Prior to Visit  Medication Sig Dispense Refill  . ACCU-CHEK FASTCLIX LANCETS MISC daily. use as directed  4  . acetaminophen (TYLENOL) 325 MG tablet Take 650 mg by mouth every 6 (six) hours as needed for mild pain or headache.    Marland Kitchen aspirin (ASPIRIN LOW DOSE) 81 MG EC tablet Take 1 tablet (81 mg total) by mouth daily. 90 tablet 3  . atorvastatin (LIPITOR) 40 MG tablet Take 1 tablet (40 mg total) by mouth daily. 90 tablet 1  . azelastine (ASTELIN) 0.1 % nasal spray Place 2 sprays into both  nostrils at bedtime as needed for rhinitis. Use in each nostril as directed 30 mL 3  . BD  VEO INSULIN SYRINGE U/F 31G X 15/64" 1 ML MISC USE TO INJECT insulin EVERY DAY  11  . Cyanocobalamin (B-12) 1000 MCG TBCR Take 1 tablet by mouth daily. 90 tablet 3  . donepezil (ARICEPT) 10 MG tablet Take 2 tablets (20 mg total) by mouth at bedtime.    . Ferrous Fumarate-Vitamin C ER (FERRO-SEQUELS) 65-25 MG TBCR Take 1 tablet by mouth daily. 90 tablet 3  . Insulin Detemir (LEVEMIR FLEXTOUCH) 100 UNIT/ML Pen Inject 52 Units into the skin at bedtime.     Marland Kitchen losartan (COZAAR) 25 MG tablet Take 1 tablet (25 mg total) by mouth every evening. 90 tablet 1  . memantine (NAMENDA) 10 MG tablet Take 10 mg by mouth 2 (two) times daily.  3  . metFORMIN (GLUCOPHAGE) 1000 MG tablet Take 1,000 mg by mouth 2 (two) times daily.  3  . Multiple Vitamins-Minerals (CENTRUM SILVER 50+WOMEN) TABS Take 1 tablet by mouth daily. 90 tablet 3  . neomycin-polymyxin-hydrocortisone (CORTISPORIN) OTIC solution APPLY 1 TO 2 DROPS TO toe 2 TIMES DAILY AFTER SOAKING.    . nitrofurantoin, macrocrystal-monohydrate, (MACROBID) 100 MG capsule Take 1 capsule (100 mg total) by mouth 2 (two) times daily. 6 capsule 0  . ONE TOUCH ULTRA TEST test strip USE TO CHECK BLOOD SUGAR EVERY DAY  4  . pantoprazole (PROTONIX) 40 MG tablet Take 1 tablet (40 mg total) by mouth 2 (two) times daily before a meal. 180 tablet 3  . sertraline (ZOLOFT) 100 MG tablet TAKE 1 TABLET BY MOUTH EVERY DAY AND TAKE 1/2 TABLET AT BEDTIME  2  . sodium fluoride (PREVIDENT 5000 DRY MOUTH) 1.1 % GEL dental gel Place 1 application onto teeth at bedtime as needed (for teeth).     Marland Kitchen tiZANidine (ZANAFLEX) 2 MG tablet Take 2-4 mg by mouth 2 (two) times daily as needed.     No current facility-administered medications on file prior to visit.     BP 100/84   Pulse (!) 59   Temp 98.3 F (36.8 C) (Oral)   Resp 16   Ht 5\' 7"  (1.702 m)   Wt 180 lb 3.2 oz (81.7 kg)   SpO2 100%   BMI  28.22 kg/m       Objective:   Physical Exam  General- No acute distress. Pleasant patient. Neck- Full range of motion, no jvd Lungs- Clear, even and unlabored. Heart- regular rate and rhythm. Neurologic- CNII- XII grossly intact.  Rt foot- mld tender at distal mid 3rd metatarsal area. 2nd toe. Mixture of redness and bruised appearance top aspect(mild tender to palpation). Beneath toe bruised appearance.      Assessment & Plan:  Your recent right foot pain with redness and bruising to second toe.  Appears may have suffered possible injury but no recollection of any particular injury or trauma.  Pain that is present today and the redness causes concern for possible skin infection.  With history of diabetes would recommend we proceed with caution.  Placed x-ray order of foot to be done today.  Also will get CBC, A1c and uric acid.  Prescription of doxycycline antibiotic for possible infection given today.  Rx advisement given regarding not using doxycycline on empty stomach.  Advised immediate medium snack or full meal before tablet.   We will follow lab results and imaging results and notify you.  Follow-up this coming Friday or sooner if needed.  If any  redness expanding foot before Friday then recommend ED evaluation.  Mackie Pai, PA-C

## 2018-03-11 LAB — CBC WITH DIFFERENTIAL/PLATELET
Basophils Absolute: 0.1 10*3/uL (ref 0.0–0.1)
Basophils Relative: 0.9 % (ref 0.0–3.0)
Eosinophils Absolute: 0.4 10*3/uL (ref 0.0–0.7)
Eosinophils Relative: 5 % (ref 0.0–5.0)
HCT: 35.4 % — ABNORMAL LOW (ref 36.0–46.0)
Hemoglobin: 11.9 g/dL — ABNORMAL LOW (ref 12.0–15.0)
Lymphocytes Relative: 30.1 % (ref 12.0–46.0)
Lymphs Abs: 2.3 10*3/uL (ref 0.7–4.0)
MCHC: 33.7 g/dL (ref 30.0–36.0)
MCV: 85.1 fl (ref 78.0–100.0)
Monocytes Absolute: 0.4 10*3/uL (ref 0.1–1.0)
Monocytes Relative: 5.5 % (ref 3.0–12.0)
Neutro Abs: 4.4 10*3/uL (ref 1.4–7.7)
Neutrophils Relative %: 58.5 % (ref 43.0–77.0)
Platelets: 169 10*3/uL (ref 150.0–400.0)
RBC: 4.16 Mil/uL (ref 3.87–5.11)
RDW: 14.2 % (ref 11.5–15.5)
WBC: 7.6 10*3/uL (ref 4.0–10.5)

## 2018-03-11 LAB — COMPREHENSIVE METABOLIC PANEL
ALT: 20 U/L (ref 0–35)
AST: 21 U/L (ref 0–37)
Albumin: 4.4 g/dL (ref 3.5–5.2)
Alkaline Phosphatase: 56 U/L (ref 39–117)
BUN: 10 mg/dL (ref 6–23)
CO2: 28 mEq/L (ref 19–32)
Calcium: 9.4 mg/dL (ref 8.4–10.5)
Chloride: 102 mEq/L (ref 96–112)
Creatinine, Ser: 0.7 mg/dL (ref 0.40–1.20)
GFR: 82.01 mL/min (ref >60.00)
Glucose, Bld: 131 mg/dL — ABNORMAL HIGH (ref 70–99)
Potassium: 3.9 mEq/L (ref 3.5–5.1)
Sodium: 137 mEq/L (ref 135–145)
Total Bilirubin: 0.4 mg/dL (ref 0.2–1.2)
Total Protein: 6.9 g/dL (ref 6.0–8.3)

## 2018-03-11 LAB — URIC ACID: Uric Acid, Serum: 3.7 mg/dL (ref 2.4–7.0)

## 2018-03-11 LAB — HEMOGLOBIN A1C: Hgb A1c MFr Bld: 6.1 % (ref 4.6–6.5)

## 2018-03-12 ENCOUNTER — Ambulatory Visit: Payer: Medicare Other | Admitting: Physical Therapy

## 2018-03-12 ENCOUNTER — Encounter: Payer: Self-pay | Admitting: Physical Therapy

## 2018-03-12 DIAGNOSIS — M6281 Muscle weakness (generalized): Secondary | ICD-10-CM

## 2018-03-12 DIAGNOSIS — R279 Unspecified lack of coordination: Secondary | ICD-10-CM

## 2018-03-12 NOTE — Therapy (Signed)
[facility] [address] [place], [place], [address] Phone: [phone]   Fax:  [phone]  Physical Therapy Treatment  Patient Details  Name: [name] MRN: [id number] Date of Birth: 1945/04/13 Referring Provider (PT): R32,R15.9 (ICD-10-CM) - Bowel and bladder incontinence   Encounter Date: 03/12/2018  PT End of Session - 03/12/18 1447    Visit Number  2    Date for PT Re-Evaluation  04/20/18    PT Start Time  1447    PT Stop Time  1525    PT Time Calculation (min)  38 min    Activity Tolerance  Patient tolerated treatment well    Behavior During Therapy  [facility] for tasks assessed/performed       Past Medical History:  Diagnosis Date  . Adenomatous colon polyp 03/1988  . Anemia    borderline  . Arthritis    R shoulder - degenerative   . Cataract    beginning stages  . Chest pain     Sept 2011: stress test neg  . Depression    sees Dr.Cotle  . Diabetes mellitus    dr Buddy Duty  . Diverticulosis   . Fatty liver    Increased LFTs, saw GI 06/2011, likely from fatty liver   . GERD (gastroesophageal reflux disease)   . History of hiatal hernia   . Hyperlipemia   . Hypertension   . IBS (irritable bowel syndrome)    and dyspepsia  . Memory impairment 08/2014   mild cognitive impairment vs. mild dementia, reommended reinstating of cholinesterase inhibitor   . Osteopenia    dexa 06/2007 and 10/11 Rx CA vitamin d  . RLS (restless legs syndrome)    h/o- no longer using Requip    Past Surgical History:  Procedure Laterality Date  . BREAST BIOPSY Left 10/11/2015   fibroadenoma w/ calcifications, no evidence of malignancy, recommend mammogram repeat-6 mnths  . POLYPECTOMY    . REVERSE SHOULDER ARTHROPLASTY Right 04/21/2014   Procedure: REVERSE SHOULDER ARTHROPLASTY;  Surgeon: Tania Ade, MD;  Location: [place];  Service: Orthopedics;  Laterality: Right;  Right reverse total shoulder replacement  .  TONSILLECTOMY    . UTERINE FIBROID EMBOLIZATION     90s    There were no vitals filed for this visit.  Subjective Assessment - 03/12/18 1620    Subjective  Pt reports she has been using urge to void techniques.  She reports still difficult to know if she is engaging the muscles    Patient Stated Goals  stop having leakage     Currently in Pain?  No/denies                    Pelvic Floor Special Questions - 03/12/18 0001    Biofeedback  supine and sitting - kegel with and without ball - cues to not engage gluteals or hold breath    Biofeedback sensor type  Surface    Biofeedback Activity  --   4 sec hold                    PT Long Term Goals - 03/09/18 1401      PT LONG TERM GOAL #1   Title  Pt will report at least 50% less urinary and fecal leakage     Baseline  urinary all day, fecal 1x/day    Time  8    Period  Weeks    Status  New  Target Date  04/20/18      PT LONG TERM GOAL #2   Title  pt will be ind with advanced HEP    Time  8    Period  Weeks    Status  New    Target Date  04/20/18      PT LONG TERM GOAL #3   Title  Pt will demonstrate 4+/5 MMT bil hip adduction for improved activation of pelvic floor in standing    Time  8    Period  Weeks    Status  New    Target Date  04/20/18      PT LONG TERM GOAL #4   Title  Pt will demonstrate 5x sit to stand in <13 seconds for reduced risk of falls    Time  8    Period  Weeks    Status  New    Target Date  04/20/18            Plan - 03/12/18 1514    Clinical Impression Statement  Pt did well with biofeedback today.  She needs cues to not use glutes and lumbar paraspinals.  She  was able to perform exercises in sitting with cues not to hold her breath.  Pt will continue to benefit from skilled PT to work on improved strength and endurance    PT Treatment/Interventions  ADLs/Self Care Home Management;Biofeedback;Dry needling;Manual techniques;Neuromuscular  re-education;Therapeutic exercise;Therapeutic activities;Patient/family education    PT Next Visit Plan  biofeedback, coordinating breathing with kegel,     Consulted and Agree with Plan of Care  Patient       Patient will benefit from skilled therapeutic intervention in order to improve the following deficits and impairments:  Abnormal gait, Postural dysfunction, Decreased strength, Decreased coordination  Visit Diagnosis: Muscle weakness (generalized)  Unspecified lack of coordination     Problem List Patient Active Problem List   Diagnosis Date Noted  . Dark stools 03/17/2017  . SIADH (syndrome of inappropriate ADH production) ([place]) 10/10/2016  . Gastroesophageal reflux disease 08/06/2016  . PCP Notes >>>>>>>>>>>>>>>>> 09/21/2014  . Rotator cuff tear arthropathy 04/21/2014  . Abnormal MRI, shoulder 12/01/2013  . 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 Cervical radiculitis 11/10/2013  . Bursitis of right shoulder 08/19/2013  . Primary localized osteoarthrosis, lower leg 08/19/2013  . Vitamin B 12 deficiency 05/20/2013  . OSA (obstructive sleep apnea) 02/13/2012  . Pulmonary nodule, last  CT 2016, stable, no f/u 01/28/2012  . Dementia ([place]) 05/29/2011  . Fatty liver 02/27/2011  . Annual physical exam 11/26/2010  . Osteopenia 10/09/2007  . COLONIC POLYPS 08/22/2006  . DM II (diabetes mellitus, type II), controlled ([place]) 05/19/2006  . Hyperlipidemia 05/19/2006  . Depression 05/19/2006  . SYNDROME, RESTLESS LEGS 05/19/2006  . Essential hypertension 05/19/2006  . IBS -- constipation 05/19/2006    Jule Ser, PT 03/12/2018, 5:46 PM   [facility] [address] [place], [place], [address] Phone: [phone]   Fax:  [phone]  Name: [name] MRN: [id number] Date of Birth: March 31, 1945

## 2018-03-13 ENCOUNTER — Ambulatory Visit (INDEPENDENT_AMBULATORY_CARE_PROVIDER_SITE_OTHER): Payer: Medicare Other | Admitting: Medical

## 2018-03-13 ENCOUNTER — Encounter: Payer: Self-pay | Admitting: Medical

## 2018-03-13 VITALS — BP 132/64 | HR 58 | Temp 97.9°F

## 2018-03-13 DIAGNOSIS — L089 Local infection of the skin and subcutaneous tissue, unspecified: Secondary | ICD-10-CM | POA: Diagnosis not present

## 2018-03-13 DIAGNOSIS — M79671 Pain in right foot: Secondary | ICD-10-CM

## 2018-03-13 NOTE — Patient Instructions (Signed)
Your right foot/second toe region does appear and improved after 2 days of antibiotics.  Features of infection appear to be slowly resolving.  Less tenderness and less warmth on exam.  Continue current antibiotic.  For mild pain can use Tylenol or ibuprofen as instructed.  I expect to see progressive improvement of the toe.  If signs and symptoms change or worsen let us know.  Follow-up date to be determined at this point.  Would ask if you could get your relatives to help you send a picture through my chart on Tuesday morning so I can review the area.  If it looks like it is lightening up/returning back to normal color then no further follow-up would be needed.

## 2018-03-13 NOTE — Progress Notes (Signed)
Subjective:    Patient ID: [name], female    DOB: 1945/08/24, 73 y.o.   MRN: [id number]  HPI  Pt in for follow up on rt second toe possible skin infection vs toe contusion. Xray was negative for fracture. As part of work up uric acid, wbc and cmp were normal.  Pt can't remember any trauma/ any injury.  Pt never gets any redness in finger tips or in toes.  No known history of psoriatic arthritis.  Pt states she has been taking tylenol for pain.   Review of Systems  Constitutional: Negative for chills, fatigue and fever.  Respiratory: Negative for cough, chest tightness, shortness of breath and wheezing.   Cardiovascular: Negative for chest pain and palpitations.  Musculoskeletal:       Rt second toe pain.   Skin: Negative for rash.  Neurological: Negative for dizziness, seizures, syncope, weakness and headaches.  Hematological: Negative for adenopathy. Does not bruise/bleed easily.  Psychiatric/Behavioral: Negative for behavioral problems.    Past Medical History:  Diagnosis Date  . Adenomatous colon polyp 03/1988  . Anemia    borderline  . Arthritis    R shoulder - degenerative   . Cataract    beginning stages  . Chest pain     Sept 2011: stress test neg  . Depression    sees Dr.Cotle  . Diabetes mellitus    dr Buddy Duty  . Diverticulosis   . Fatty liver    Increased LFTs, saw GI 06/2011, likely from fatty liver   . GERD (gastroesophageal reflux disease)   . History of hiatal hernia   . Hyperlipemia   . Hypertension   . IBS (irritable bowel syndrome)    and dyspepsia  . Memory impairment 08/2014   mild cognitive impairment vs. mild dementia, reommended reinstating of cholinesterase inhibitor   . Osteopenia    dexa 06/2007 and 10/11 Rx CA vitamin d  . RLS (restless legs syndrome)    h/o- no longer using Requip     Social History   Socioeconomic History  . Marital status: Widowed    Spouse name: [name]  . Number of children: 3  . Years of education:  Not on file  . Highest education level: Not on file  Occupational History  . Occupation: retired, was a Leisure centre manager)    Comment: RN  Social Needs  . Financial resource strain: Not on file  . Food insecurity:    Worry: Not on file    Inability: Not on file  . Transportation needs:    Medical: Not on file    Non-medical: Not on file  Tobacco Use  . Smoking status: Former Smoker    Packs/day: 2.00    Years: 10.00    Pack years: 20.00    Types: Cigarettes    Last attempt to quit: 11/26/1975    Years since quitting: 42.3  . Smokeless tobacco: Never Used  . Tobacco comment: used to smoke 2 ppd  Substance and Sexual Activity  . Alcohol use: Yes    Alcohol/week: 0.0 standard drinks    Comment: rarely  . Drug use: No  . Sexual activity: Not Currently  Lifestyle  . Physical activity:    Days per week: Not on file    Minutes per session: Not on file  . Stress: Not on file  Relationships  . Social connections:    Talks on phone: Not on file    Gets together: Not on file  Attends religious service: Not on file    Active member of club or organization: Not on file    Attends meetings of clubs or organizations: Not on file    Relationship status: Not on file  . Intimate partner violence:    Fear of current or ex partner: Not on file    Emotionally abused: Not on file    Physically abused: Not on file    Forced sexual activity: Not on file  Other Topics Concern  . Not on file  Social History Narrative   Widow , lives by herself at [facility] in a 1 bedroom apt;  still drives, daughters [name]) live  in [place] in [place]     5 g-children    Past Surgical History:  Procedure Laterality Date  . BREAST BIOPSY Left 10/11/2015   fibroadenoma w/ calcifications, no evidence of malignancy, recommend mammogram repeat-6 mnths  . POLYPECTOMY    . REVERSE SHOULDER ARTHROPLASTY Right 04/21/2014   Procedure: REVERSE SHOULDER ARTHROPLASTY;  Surgeon: Tania Ade, MD;  Location: [place];  Service: Orthopedics;  Laterality: Right;  Right reverse total shoulder replacement  . TONSILLECTOMY    . UTERINE FIBROID EMBOLIZATION     90s    Family History  Problem Relation Age of Onset  . Lung cancer Mother        smoker  . Colon cancer Father        F dx in his 29s  . Ovarian cancer Paternal Aunt        ?  . Diabetes Other        aunts-uncles   . Stroke Other        aunts-uncles   . Heart attack Maternal Grandmother        60s  . Other Other        niece- glioblastoma  . Breast cancer Neg Hx   . Rectal cancer Neg Hx   . Stomach cancer Neg Hx   . Esophageal cancer Neg Hx     Allergies  Allergen Reactions  . Sulfa Antibiotics Nausea Only  . Trulicity [Dulaglutide] Nausea Only  . Penicillins Rash and Other (See Comments)    Has patient had a PCN reaction causing immediate rash, facial/tongue/throat swelling, SOB or lightheadedness with hypotension: No Has patient had a PCN reaction causing severe rash involving mucus membranes or skin necrosis: No Has patient had a PCN reaction that required hospitalization No Has patient had a PCN reaction occurring within the last 10 years: No If all of the above answers are "NO", then may proceed with Cephalosporin use.    Current Outpatient Medications on File Prior to Visit  Medication Sig Dispense Refill  . ACCU-CHEK FASTCLIX LANCETS MISC daily. use as directed  4  . acetaminophen (TYLENOL) 325 MG tablet Take 650 mg by mouth every 6 (six) hours as needed for mild pain or headache.    Marland Kitchen aspirin (ASPIRIN LOW DOSE) 81 MG EC tablet Take 1 tablet (81 mg total) by mouth daily. 90 tablet 3  . atorvastatin (LIPITOR) 40 MG tablet Take 1 tablet (40 mg total) by mouth daily. 90 tablet 1  . azelastine (ASTELIN) 0.1 % nasal spray Place 2 sprays into both nostrils at bedtime as needed for rhinitis. Use in each nostril as directed 30 mL 3  . BD VEO INSULIN SYRINGE U/F 31G X 15/64" 1 ML MISC USE TO INJECT  insulin EVERY DAY  11  . Cyanocobalamin (B-12) 1000 MCG TBCR  Take 1 tablet by mouth daily. 90 tablet 3  . donepezil (ARICEPT) 10 MG tablet Take 2 tablets (20 mg total) by mouth at bedtime.    Marland Kitchen doxycycline (VIBRA-TABS) 100 MG tablet Take 1 tablet (100 mg total) by mouth 2 (two) times daily. Can give caps or generic 14 tablet 0  . Ferrous Fumarate-Vitamin C ER (FERRO-SEQUELS) 65-25 MG TBCR Take 1 tablet by mouth daily. 90 tablet 3  . Insulin Detemir (LEVEMIR FLEXTOUCH) 100 UNIT/ML Pen Inject 52 Units into the skin at bedtime.     Marland Kitchen losartan (COZAAR) 25 MG tablet Take 1 tablet (25 mg total) by mouth every evening. 90 tablet 1  . memantine (NAMENDA) 10 MG tablet Take 10 mg by mouth 2 (two) times daily.  3  . metFORMIN (GLUCOPHAGE) 1000 MG tablet Take 1,000 mg by mouth 2 (two) times daily.  3  . Multiple Vitamins-Minerals (CENTRUM SILVER 50+WOMEN) TABS Take 1 tablet by mouth daily. 90 tablet 3  . neomycin-polymyxin-hydrocortisone (CORTISPORIN) OTIC solution APPLY 1 TO 2 DROPS TO toe 2 TIMES DAILY AFTER SOAKING.    . nitrofurantoin, macrocrystal-monohydrate, (MACROBID) 100 MG capsule Take 1 capsule (100 mg total) by mouth 2 (two) times daily. 6 capsule 0  . ONE TOUCH ULTRA TEST test strip USE TO CHECK BLOOD SUGAR EVERY DAY  4  . pantoprazole (PROTONIX) 40 MG tablet Take 1 tablet (40 mg total) by mouth 2 (two) times daily before a meal. 180 tablet 3  . sertraline (ZOLOFT) 100 MG tablet TAKE 1 TABLET BY MOUTH EVERY DAY AND TAKE 1/2 TABLET AT BEDTIME  2  . sodium fluoride (PREVIDENT 5000 DRY MOUTH) 1.1 % GEL dental gel Place 1 application onto teeth at bedtime as needed (for teeth).     Marland Kitchen tiZANidine (ZANAFLEX) 2 MG tablet Take 2-4 mg by mouth 2 (two) times daily as needed.     No current facility-administered medications on file prior to visit.     There were no vitals taken for this visit.      Objective:   Physical Exam  General- No acute distress. Pleasant patient. Neck- Full range of motion,  no jvd Lungs- Clear, even and unlabored. Heart- regular rate and rhythm. Neurologic- CNII- XII grossly intact.  Rt foot- mild tender at distal mid 3rd metatarsal area. 2nd toe. It looks less red with no bruised appearance. Less tender and less warmth. Beneath toe 2nd toe. No longer tender at bottom 2nd toe.      Assessment & Plan:  Your right foot/second toe region does appear and improved after 2 days of antibiotics.  Features of infection appear to be slowly resolving.  Less tenderness and less warmth on exam.  Continue current antibiotic.  For mild pain can use Tylenol or ibuprofen as instructed.  I expect to see progressive improvement of the toe.  If signs and symptoms change or worsen let us know.  Follow-up date to be determined at this point.  Would ask if you could get your relatives to help you send a picture through my chart on Tuesday morning so I can review the area.  If it looks like it is lightening up/returning back to normal color then no further follow-up would be needed.

## 2018-03-16 ENCOUNTER — Telehealth: Payer: Self-pay | Admitting: Physical Therapy

## 2018-03-16 ENCOUNTER — Ambulatory Visit: Payer: Medicare Other | Admitting: Physical Therapy

## 2018-03-16 NOTE — Telephone Encounter (Signed)
Patient did not show for appointment.  Patient was called and PT left message to please call us back.  Gustavus Bryant, PT 03/16/18 12:23 PM

## 2018-03-17 ENCOUNTER — Encounter: Payer: Self-pay | Admitting: Medical

## 2018-03-17 ENCOUNTER — Telehealth: Payer: Self-pay | Admitting: Medical

## 2018-03-17 MED ORDER — DOXYCYCLINE HYCLATE 100 MG PO TABS: 100.0000 mg | ORAL_TABLET | Freq: Two times a day (BID) | ORAL | 0 refills | Status: DC

## 2018-03-17 NOTE — Telephone Encounter (Signed)
3 more days of doxycycline sent to pt pharmacy.

## 2018-03-18 ENCOUNTER — Other Ambulatory Visit: Payer: Self-pay

## 2018-03-18 ENCOUNTER — Ambulatory Visit: Payer: Medicare Other | Admitting: Physical Therapy

## 2018-03-18 DIAGNOSIS — M6281 Muscle weakness (generalized): Secondary | ICD-10-CM

## 2018-03-18 DIAGNOSIS — R279 Unspecified lack of coordination: Secondary | ICD-10-CM

## 2018-03-18 NOTE — Therapy (Signed)
[facility] [address] [place], [place], [address] Phone: [phone]   Fax:  [phone]  Physical Therapy Treatment  Patient Details  Name: [name] MRN: [id number] Date of Birth: 08-10-1945 Referring Provider (PT): R32,R15.9 (ICD-10-CM) - Bowel and bladder incontinence   Encounter Date: 03/18/2018  PT End of Session - 03/18/18 1533    Visit Number  3    Date for PT Re-Evaluation  04/20/18    PT Start Time  1408    PT Stop Time  1447    PT Time Calculation (min)  39 min    Activity Tolerance  Patient tolerated treatment well    Behavior During Therapy  [facility] for tasks assessed/performed       Past Medical History:  Diagnosis Date  . Adenomatous colon polyp 03/1988  . Anemia    borderline  . Arthritis    R shoulder - degenerative   . Cataract    beginning stages  . Chest pain     Sept 2011: stress test neg  . Depression    sees Dr.Cotle  . Diabetes mellitus    dr Buddy Duty  . Diverticulosis   . Fatty liver    Increased LFTs, saw GI 06/2011, likely from fatty liver   . GERD (gastroesophageal reflux disease)   . History of hiatal hernia   . Hyperlipemia   . Hypertension   . IBS (irritable bowel syndrome)    and dyspepsia  . Memory impairment 08/2014   mild cognitive impairment vs. mild dementia, reommended reinstating of cholinesterase inhibitor   . Osteopenia    dexa 06/2007 and 10/11 Rx CA vitamin d  . RLS (restless legs syndrome)    h/o- no longer using Requip    Past Surgical History:  Procedure Laterality Date  . BREAST BIOPSY Left 10/11/2015   fibroadenoma w/ calcifications, no evidence of malignancy, recommend mammogram repeat-6 mnths  . POLYPECTOMY    . REVERSE SHOULDER ARTHROPLASTY Right 04/21/2014   Procedure: REVERSE SHOULDER ARTHROPLASTY;  Surgeon: Tania Ade, MD;  Location: [facility];  Service: Orthopedics;  Laterality: Right;  Right reverse total shoulder replacement  .  TONSILLECTOMY    . UTERINE FIBROID EMBOLIZATION     90s    There were no vitals filed for this visit.                 Pelvic Floor Special Questions - 03/18/18 0001    Biofeedback  supine, standing and sitting, stepping one foot forwards, squat        [facility] Adult PT Treatment/Exercise - 03/18/18 0001      Neuro Re-ed    Neuro Re-ed Details   biofeedback used throughout      Exercises   Exercises  Lumbar      Lumbar Exercises: Standing   Functional Squats  15 reps;2 seconds   mini squat with kegel    Other Standing Lumbar Exercises  staggared step             PT Education - 03/18/18 1448    Education Details   Access Code: [email or web address]     Person(s) Educated  Patient    Methods  Explanation;Demonstration;Verbal cues;Handout    Comprehension  Verbalized understanding;Returned demonstration          PT Long Term Goals - 03/09/18 1401      PT LONG TERM GOAL #1   Title  Pt will report at least 50% less urinary and fecal leakage  Baseline  urinary all day, fecal 1x/day    Time  8    Period  Weeks    Status  New    Target Date  04/20/18      PT LONG TERM GOAL #2   Title  pt will be ind with advanced HEP    Time  8    Period  Weeks    Status  New    Target Date  04/20/18      PT LONG TERM GOAL #3   Title  Pt will demonstrate 4+/5 MMT bil hip adduction for improved activation of pelvic floor in standing    Time  8    Period  Weeks    Status  New    Target Date  04/20/18      PT LONG TERM GOAL #4   Title  Pt will demonstrate 5x sit to stand in <13 seconds for reduced risk of falls    Time  8    Period  Weeks    Status  New    Target Date  04/20/18            Plan - 03/18/18 1536    Clinical Impression Statement  Pt improved and able to do pelvic contraction around 15-57mV in all positions. Pt was able to hold contraction for 6 seconds and was. Continue with POC at this time    PT Treatment/Interventions  ADLs/Self Care Home  Management;Biofeedback;Dry needling;Manual techniques;Neuromuscular re-education;Therapeutic exercise;Therapeutic activities;Patient/family education    PT Next Visit Plan  biofeedback, coordinating breathing with kegel,     PT Home Exercise Plan  breathing with kegel and urge to void    Consulted and Agree with Plan of Care  Patient       Patient will benefit from skilled therapeutic intervention in order to improve the following deficits and impairments:  Abnormal gait, Postural dysfunction, Decreased strength, Decreased coordination  Visit Diagnosis: Muscle weakness (generalized)  Unspecified lack of coordination     Problem List Patient Active Problem List   Diagnosis Date Noted  . Dark stools 03/17/2017  . SIADH (syndrome of inappropriate ADH production) ([place]) 10/10/2016  . Gastroesophageal reflux disease 08/06/2016  . PCP Notes >>>>>>>>>>>>>>>>> 09/21/2014  . Rotator cuff tear arthropathy 04/21/2014  . Abnormal MRI, shoulder 12/01/2013  . 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 Cervical radiculitis 11/10/2013  . Bursitis of right shoulder 08/19/2013  . Primary localized osteoarthrosis, lower leg 08/19/2013  . Vitamin B 12 deficiency 05/20/2013  . OSA (obstructive sleep apnea) 02/13/2012  . Pulmonary nodule, last  CT 2016, stable, no f/u 01/28/2012  . Dementia ([place]) 05/29/2011  . Fatty liver 02/27/2011  . Annual physical exam 11/26/2010  . Osteopenia 10/09/2007  . COLONIC POLYPS 08/22/2006  . DM II (diabetes mellitus, type II), controlled ([place]) 05/19/2006  . Hyperlipidemia 05/19/2006  . Depression 05/19/2006  . SYNDROME, RESTLESS LEGS 05/19/2006  . Essential hypertension 05/19/2006  . IBS -- constipation 05/19/2006    Jule Ser, PT 03/18/2018, 3:46 PM  [facility] [address] [place] [address] [place], [place], [address] Phone: [phone]   Fax:  [phone]  Name: [name] MRN:  [id number] Date of Birth: 1945/02/05

## 2018-03-18 NOTE — Patient Instructions (Signed)
Access Code: [email or web address]  URL: [email or web address]  Date: 03/18/2018  Prepared by: Jari Favre   Exercises  Seated Pelvic Floor Contraction - 10 reps - 3 sets - 5 sec hold hold - 1x daily - 7x weekly  Seated Pelvic Floor Contraction with Isometric Hip Adduction - 10 reps - 1 sets - 5 sechold, 5 sec rest hold - 3x daily - 7x weekly  Mini Squat with Pelvic Floor Contraction - 10 reps - 3 sets - 1x daily - 7x weekly

## 2018-03-20 ENCOUNTER — Other Ambulatory Visit: Payer: Self-pay

## 2018-03-20 ENCOUNTER — Ambulatory Visit (INDEPENDENT_AMBULATORY_CARE_PROVIDER_SITE_OTHER): Payer: Medicare Other | Admitting: Medical

## 2018-03-20 ENCOUNTER — Encounter: Payer: Self-pay | Admitting: Medical

## 2018-03-20 VITALS — BP 118/59 | HR 57 | Temp 98.5°F | Resp 16 | Ht 67.0 in | Wt 180.0 lb

## 2018-03-20 DIAGNOSIS — L089 Local infection of the skin and subcutaneous tissue, unspecified: Secondary | ICD-10-CM

## 2018-03-20 DIAGNOSIS — M79674 Pain in right toe(s): Secondary | ICD-10-CM | POA: Diagnosis not present

## 2018-03-20 MED ORDER — PREDNISONE 10 MG PO TABS: ORAL_TABLET | ORAL | 0 refills | Status: DC

## 2018-03-20 MED ORDER — CEFTRIAXONE SODIUM 500 MG IJ SOLR
500.0000 mg | Freq: Once | INTRAMUSCULAR | Status: AC
  Administered 2018-03-20: 500 mg via INTRAMUSCULAR

## 2018-03-20 NOTE — Progress Notes (Signed)
Subjective:    Patient ID: [name], female    DOB: 06-19-1945, 73 y.o.   MRN: [id number]  HPI  Pt in for follow up.  Her rt second toe pain is recently about the same as it was before. Pt has had faint pink appearance since she sent me picture on my chart. Since then she reports having persisting pain in toe. Pain is moderate. She is taking tramadol for the pain I had ordered uric acid level and that was normal. Her cbc showed normal wbc.  I saw pt on 03-10-2018 intitially and gave doxycycline. She is still on doxycycline but improvement has stalled per pt.  Xray of foot was negative.    Review of Systems  Constitutional: Negative for chills, fatigue and fever.  Respiratory: Negative for cough, chest tightness, shortness of breath and wheezing.   Cardiovascular: Negative for chest pain and palpitations.  Musculoskeletal:       Rt second toe pain.  Skin: Negative for rash.  Hematological: Negative for adenopathy. Does not bruise/bleed easily.  Psychiatric/Behavioral: The patient is not nervous/anxious.     Past Medical History:  Diagnosis Date  . Adenomatous colon polyp 03/1988  . Anemia    borderline  . Arthritis    R shoulder - degenerative   . Cataract    beginning stages  . Chest pain     Sept 2011: stress test neg  . Depression    sees Dr.Cotle  . Diabetes mellitus    dr Buddy Duty  . Diverticulosis   . Fatty liver    Increased LFTs, saw GI 06/2011, likely from fatty liver   . GERD (gastroesophageal reflux disease)   . History of hiatal hernia   . Hyperlipemia   . Hypertension   . IBS (irritable bowel syndrome)    and dyspepsia  . Memory impairment 08/2014   mild cognitive impairment vs. mild dementia, reommended reinstating of cholinesterase inhibitor   . Osteopenia    dexa 06/2007 and 10/11 Rx CA vitamin d  . RLS (restless legs syndrome)    h/o- no longer using Requip     Social History   Socioeconomic History  . Marital status: Widowed    Spouse  name: [name]  . Number of children: 3  . Years of education: Not on file  . Highest education level: Not on file  Occupational History  . Occupation: retired, was a Leisure centre manager)    Comment: RN  Social Needs  . Financial resource strain: Not on file  . Food insecurity:    Worry: Not on file    Inability: Not on file  . Transportation needs:    Medical: Not on file    Non-medical: Not on file  Tobacco Use  . Smoking status: Former Smoker    Packs/day: 2.00    Years: 10.00    Pack years: 20.00    Types: Cigarettes    Last attempt to quit: 11/26/1975    Years since quitting: 42.3  . Smokeless tobacco: Never Used  . Tobacco comment: used to smoke 2 ppd  Substance and Sexual Activity  . Alcohol use: Yes    Alcohol/week: 0.0 standard drinks    Comment: rarely  . Drug use: No  . Sexual activity: Not Currently  Lifestyle  . Physical activity:    Days per week: Not on file    Minutes per session: Not on file  . Stress: Not on file  Relationships  . Social connections:  Talks on phone: Not on file    Gets together: Not on file    Attends religious service: Not on file    Active member of club or organization: Not on file    Attends meetings of clubs or organizations: Not on file    Relationship status: Not on file  . Intimate partner violence:    Fear of current or ex partner: Not on file    Emotionally abused: Not on file    Physically abused: Not on file    Forced sexual activity: Not on file  Other Topics Concern  . Not on file  Social History Narrative   Widow , lives by herself at [facility] in a 1 bedroom apt;  still drives, daughters [name]) live  in [place] in [place]     5 g-children    Past Surgical History:  Procedure Laterality Date  . BREAST BIOPSY Left 10/11/2015   fibroadenoma w/ calcifications, no evidence of malignancy, recommend mammogram repeat-6 mnths  . POLYPECTOMY    . REVERSE SHOULDER ARTHROPLASTY Right 04/21/2014    Procedure: REVERSE SHOULDER ARTHROPLASTY;  Surgeon: Tania Ade, MD;  Location: [facility];  Service: Orthopedics;  Laterality: Right;  Right reverse total shoulder replacement  . TONSILLECTOMY    . UTERINE FIBROID EMBOLIZATION     90s    Family History  Problem Relation Age of Onset  . Lung cancer Mother        smoker  . Colon cancer Father        F dx in his 59s  . Ovarian cancer Paternal Aunt        ?  . Diabetes Other        aunts-uncles   . Stroke Other        aunts-uncles   . Heart attack Maternal Grandmother        60s  . Other Other        niece- glioblastoma  . Breast cancer Neg Hx   . Rectal cancer Neg Hx   . Stomach cancer Neg Hx   . Esophageal cancer Neg Hx     Allergies  Allergen Reactions  . Sulfa Antibiotics Nausea Only  . Trulicity [Dulaglutide] Nausea Only  . Penicillins Rash and Other (See Comments)    Has patient had a PCN reaction causing immediate rash, facial/tongue/throat swelling, SOB or lightheadedness with hypotension: No Has patient had a PCN reaction causing severe rash involving mucus membranes or skin necrosis: No Has patient had a PCN reaction that required hospitalization No Has patient had a PCN reaction occurring within the last 10 years: No If all of the above answers are "NO", then may proceed with Cephalosporin use.    Current Outpatient Medications on File Prior to Visit  Medication Sig Dispense Refill  . ACCU-CHEK FASTCLIX LANCETS MISC daily. use as directed  4  . acetaminophen (TYLENOL) 325 MG tablet Take 650 mg by mouth every 6 (six) hours as needed for mild pain or headache.    Marland Kitchen aspirin (ASPIRIN LOW DOSE) 81 MG EC tablet Take 1 tablet (81 mg total) by mouth daily. 90 tablet 3  . atorvastatin (LIPITOR) 40 MG tablet Take 1 tablet (40 mg total) by mouth daily. 90 tablet 1  . azelastine (ASTELIN) 0.1 % nasal spray Place 2 sprays into both nostrils at bedtime as needed for rhinitis. Use in each nostril as directed 30 mL 3  . BD VEO  INSULIN SYRINGE U/F 31G X 15/64" 1  ML MISC USE TO INJECT insulin EVERY DAY  11  . Cyanocobalamin (B-12) 1000 MCG TBCR Take 1 tablet by mouth daily. 90 tablet 3  . donepezil (ARICEPT) 10 MG tablet Take 2 tablets (20 mg total) by mouth at bedtime.    Marland Kitchen doxycycline (VIBRA-TABS) 100 MG tablet Take 1 tablet (100 mg total) by mouth 2 (two) times daily. Can give caps or generic 6 tablet 0  . Ferrous Fumarate-Vitamin C ER (FERRO-SEQUELS) 65-25 MG TBCR Take 1 tablet by mouth daily. 90 tablet 3  . Insulin Detemir (LEVEMIR FLEXTOUCH) 100 UNIT/ML Pen Inject 52 Units into the skin at bedtime.     Marland Kitchen losartan (COZAAR) 25 MG tablet Take 1 tablet (25 mg total) by mouth every evening. 90 tablet 1  . memantine (NAMENDA) 10 MG tablet Take 10 mg by mouth 2 (two) times daily.  3  . metFORMIN (GLUCOPHAGE) 1000 MG tablet Take 1,000 mg by mouth 2 (two) times daily.  3  . Multiple Vitamins-Minerals (CENTRUM SILVER 50+WOMEN) TABS Take 1 tablet by mouth daily. 90 tablet 3  . neomycin-polymyxin-hydrocortisone (CORTISPORIN) OTIC solution APPLY 1 TO 2 DROPS TO toe 2 TIMES DAILY AFTER SOAKING.    . nitrofurantoin, macrocrystal-monohydrate, (MACROBID) 100 MG capsule Take 1 capsule (100 mg total) by mouth 2 (two) times daily. 6 capsule 0  . ONE TOUCH ULTRA TEST test strip USE TO CHECK BLOOD SUGAR EVERY DAY  4  . pantoprazole (PROTONIX) 40 MG tablet Take 1 tablet (40 mg total) by mouth 2 (two) times daily before a meal. 180 tablet 3  . sertraline (ZOLOFT) 100 MG tablet TAKE 1 TABLET BY MOUTH EVERY DAY AND TAKE 1/2 TABLET AT BEDTIME  2  . sodium fluoride (PREVIDENT 5000 DRY MOUTH) 1.1 % GEL dental gel Place 1 application onto teeth at bedtime as needed (for teeth).     Marland Kitchen tiZANidine (ZANAFLEX) 2 MG tablet Take 2-4 mg by mouth 2 (two) times daily as needed.     No current facility-administered medications on file prior to visit.     BP (!) 118/59   Pulse (!) 57   Temp 98.5 F (36.9 C) (Oral)   Resp 16   Ht 5\' 7"  (1.702 m)   Wt  180 lb (81.6 kg)   SpO2 99%   BMI 28.19 kg/m       Objective:   Physical Exam  General- No acute distress. Pleasant patient. Neck- Full range of motion, no jvd Lungs- Clear, even and unlabored. Heart- regular rate and rhythm. Neurologic- CNII- XII grossly intact.  Rt foot- second toe mild pinkish red appearance. Today pt points out faint brown thin line on top of foot(she states it looks like stinger to her)       Assessment & Plan:  The redness of your toe still persists but I do not think it is to the same degree as initially.  The doxycycline may have helped.  However the fact that your symptoms are still persisting makes me consider further differential diagnosis.  Your uric acid was normal but that does not exclude gout completely.  Since you still are having moderate level pain.  I do think it would be a good idea to give you 4-day taper dose of low-dose prednisone.  We will see if this has impact on your pain level.  Would recommend that you continue the final 2 days of doxycycline.  In the event persisting infection decided to give you 500 mg of Rocephin today.  You did  point out that the same on top of your second toe might be a foreign body/stinger.  On your initial presentation remember that you had a slight bruise appears to the toe.  I did go ahead and place referral to podiatrist today.  You do also already have podiatrist that you see.  So would asked that you call them on Monday morning to get scheduled sometime early in the week.  Let them know that referral has already been placed.  Please give me update on how you are to feels on Monday as well.  Follow-up date to be determined on response above treatment or after you see podiatrist.  Mackie Pai, PA-C

## 2018-03-20 NOTE — Patient Instructions (Signed)
The redness of your toe still persists but I do not think it is to the same degree as initially.  The doxycycline may have helped.  However the fact that your symptoms are still persisting makes me consider further differential diagnosis.  Your uric acid was normal but that does not exclude gout completely.  Since you still are having moderate level pain.  I do think it would be a good idea to give you 4-day taper dose of low-dose prednisone.  We will see if this has impact on your pain level.  Would recommend that you continue the final 2 days of doxycycline.  In the event persisting infection decided to give you 500 mg of Rocephin today.  You did point out that the same on top of your second toe might be a foreign body/stinger.  On your initial presentation remember that you had a slight bruise appears to the toe.  I did go ahead and place referral to podiatrist today.  You do also already have podiatrist that you see.  So would asked that you call them on Monday morning to get scheduled sometime early in the week.  Let them know that referral has already been placed.  Please give me update on how you are to feels on Monday as well.  Follow-up date to be determined on response above treatment or after you see podiatrist.

## 2018-03-21 ENCOUNTER — Encounter: Payer: Self-pay | Admitting: Podiatry

## 2018-03-23 ENCOUNTER — Encounter: Payer: Self-pay | Admitting: Physical Therapy

## 2018-03-24 ENCOUNTER — Encounter: Payer: Self-pay | Admitting: Podiatry

## 2018-03-24 ENCOUNTER — Ambulatory Visit (INDEPENDENT_AMBULATORY_CARE_PROVIDER_SITE_OTHER): Payer: Medicare Other | Admitting: Podiatry

## 2018-03-24 ENCOUNTER — Other Ambulatory Visit: Payer: Self-pay

## 2018-03-24 DIAGNOSIS — Q828 Other specified congenital malformations of skin: Secondary | ICD-10-CM | POA: Diagnosis not present

## 2018-03-24 DIAGNOSIS — M79676 Pain in unspecified toe(s): Secondary | ICD-10-CM | POA: Diagnosis not present

## 2018-03-24 DIAGNOSIS — L02611 Cutaneous abscess of right foot: Secondary | ICD-10-CM

## 2018-03-24 DIAGNOSIS — L03031 Cellulitis of right toe: Secondary | ICD-10-CM

## 2018-03-24 DIAGNOSIS — B351 Tinea unguium: Secondary | ICD-10-CM

## 2018-03-24 NOTE — Progress Notes (Signed)
She presents today for follow-up painful nails bilaterally she also states that her second toe on her right foot got an infection and she is been on 2 different antibiotics for.  Objective: Vital signs are stable alert oriented x3 there is no open lesions or wounds to the swollen second toe of the right foot.  There is mild erythema no cellulitis drainage or odor.  Otherwise toenails are thick yellow dystrophic clinically mycotic.  Assessment: Pain in limb secondary to onychomycosis resolving cellulitis second toe right.  Plan: Debridement of toenails 1 through 5 bilateral.  Should her second toe worsen she will notify us.

## 2018-03-25 ENCOUNTER — Encounter: Payer: Self-pay | Admitting: Physical Therapy

## 2018-03-26 ENCOUNTER — Ambulatory Visit: Payer: Medicare Other | Admitting: Podiatry

## 2018-03-27 ENCOUNTER — Telehealth: Payer: Self-pay | Admitting: Gastroenterology

## 2018-03-27 NOTE — Telephone Encounter (Signed)
Dr Fuller Plan this pt is calling complaining of dark stools for the past couple of weeks.  No rectal bleeding that she can see.  Does take Iron but has done so for the past 2 years.  No pepto or change in foods.  Some bloating no nausea or vomiting.

## 2018-03-27 NOTE — Telephone Encounter (Signed)
She was seen for same complaint in our office in 03/2017 with heme negative stool. Stool again had heme negative in 08/2017. Please check with her for additional symptoms the are more indicative of melena such as frequent black and tarry stools with a distinctly different odor. If those symptoms are present she should go to ED for evaluation. If not present then CBC, stool Hemoccults and we could have a phone visit with me or APP next week given COVID-19 pandemic.

## 2018-03-31 ENCOUNTER — Other Ambulatory Visit: Payer: Medicare Other | Admitting: Orthotics

## 2018-03-31 ENCOUNTER — Encounter: Payer: Self-pay | Admitting: Physical Therapy

## 2018-03-31 NOTE — Telephone Encounter (Signed)
Left message for patient to call back  

## 2018-04-01 NOTE — Telephone Encounter (Signed)
Patient called and left a VM.  She states she is doing better and is fine.  She said she would call back if her symptoms return.

## 2018-04-06 ENCOUNTER — Encounter: Payer: Self-pay | Admitting: Physical Therapy

## 2018-04-08 ENCOUNTER — Encounter: Payer: Self-pay | Admitting: Physical Therapy

## 2018-04-09 ENCOUNTER — Ambulatory Visit: Payer: Medicare Other | Admitting: Podiatry

## 2018-04-10 ENCOUNTER — Ambulatory Visit (INDEPENDENT_AMBULATORY_CARE_PROVIDER_SITE_OTHER): Payer: Medicare Other | Admitting: Internal Medicine

## 2018-04-10 ENCOUNTER — Encounter: Payer: Self-pay | Admitting: Internal Medicine

## 2018-04-10 ENCOUNTER — Telehealth: Payer: Self-pay | Admitting: Internal Medicine

## 2018-04-10 ENCOUNTER — Other Ambulatory Visit: Payer: Self-pay

## 2018-04-10 DIAGNOSIS — Z09 Encounter for follow-up examination after completed treatment for conditions other than malignant neoplasm: Secondary | ICD-10-CM

## 2018-04-10 DIAGNOSIS — R519 Headache, unspecified: Secondary | ICD-10-CM

## 2018-04-10 DIAGNOSIS — R51 Headache: Secondary | ICD-10-CM

## 2018-04-10 MED ORDER — GABAPENTIN 100 MG PO CAPS: 100.0000 mg | ORAL_CAPSULE | Freq: Every day | ORAL | 1 refills | Status: DC

## 2018-04-10 MED ORDER — PREDNISONE 10 MG PO TABS: ORAL_TABLET | ORAL | 0 refills | Status: DC

## 2018-04-10 NOTE — Telephone Encounter (Signed)
Copied from [place] [id number]. Topic: General - Inquiry >> Apr 10, 2018 12:25 PM Margot Ables wrote: Reason for CRM: pt state she talked to Dr. Larose Kells today about pain inner cheeks. She said she has been researching and is wanting to know if Dr. Larose Kells would be comfortable prescribing gabapentin as trx for this. She states Tylenol and ibuprofen are not helping. Please advise.  [facility] - [place], [place] - [address] [phone] (Phone) [phone] (Fax)

## 2018-04-10 NOTE — Telephone Encounter (Signed)
Spoke w/ [name] informed that gabapentin has been sent to [facility]. Pt verbalized understanding.

## 2018-04-10 NOTE — Progress Notes (Addendum)
Subjective:    Patient ID: [name], female    DOB: Nov 11, 1945, 73 y.o.   MRN: [id number]  DOS:  04/10/2018 Type of visit - description: Virtual Visit via Video Note  I connected with@ on 04/13/18 at  9:00 AM EDT by a video enabled telemedicine application and verified that I am speaking with the correct person using two identifiers.   THIS ENCOUNTER IS A VIRTUAL VISIT DUE TO COVID-19 - PATIENT WAS NOT SEEN IN THE OFFICE. PATIENT HAS CONSENTED TO VIRTUAL VISIT / TELEMEDICINE VISIT   Location of patient: home  Location of provider: office  I discussed the limitations of evaluation and management by telemedicine and the availability of in person appointments. The patient expressed understanding and agreed to proceed.  History of Present Illness: Acute visit  On and off problems with pain at the cheek, left side, seems to be at the inner cheek according to the patient. The pain has been exacerbated lately. She is taking Tylenol, ibuprofen or tramadol as needed. This happens several times a day. She has not seen any ulcer, growth at the oral membranes but the area feels rough.  Coronavirus: Patient lives at [facility], food is delivered to her apartment.  Review of Systems  She denies fever chills, runny nose or sore throat No chest pain no difficulty breathing Mild headache, mostly left-sided? Denies any visual disturbances or rash. No recent weight loss No jaw claudication per se No rash at the face or neck  Past Medical History:  Diagnosis Date  . Adenomatous colon polyp 03/1988  . Anemia    borderline  . Arthritis    R shoulder - degenerative   . Cataract    beginning stages  . Chest pain     Sept 2011: stress test neg  . Depression    sees Dr.Cotle  . Diabetes mellitus    dr Buddy Duty  . Diverticulosis   . Fatty liver    Increased LFTs, saw GI 06/2011, likely from fatty liver   . GERD (gastroesophageal reflux disease)   . History of hiatal hernia   .  Hyperlipemia   . Hypertension   . IBS (irritable bowel syndrome)    and dyspepsia  . Memory impairment 08/2014   mild cognitive impairment vs. mild dementia, reommended reinstating of cholinesterase inhibitor   . Osteopenia    dexa 06/2007 and 10/11 Rx CA vitamin d  . RLS (restless legs syndrome)    h/o- no longer using Requip    Past Surgical History:  Procedure Laterality Date  . BREAST BIOPSY Left 10/11/2015   fibroadenoma w/ calcifications, no evidence of malignancy, recommend mammogram repeat-6 mnths  . POLYPECTOMY    . REVERSE SHOULDER ARTHROPLASTY Right 04/21/2014   Procedure: REVERSE SHOULDER ARTHROPLASTY;  Surgeon: Tania Ade, MD;  Location: [facility];  Service: Orthopedics;  Laterality: Right;  Right reverse total shoulder replacement  . TONSILLECTOMY    . UTERINE FIBROID EMBOLIZATION     90s    Social History   Socioeconomic History  . Marital status: Widowed    Spouse name: [name]  . Number of children: 3  . Years of education: Not on file  . Highest education level: Not on file  Occupational History  . Occupation: retired, was a Leisure centre manager)    Comment: RN  Social Needs  . Financial resource strain: Not on file  . Food insecurity:    Worry: Not on file    Inability: Not on file  .  Transportation needs:    Medical: Not on file    Non-medical: Not on file  Tobacco Use  . Smoking status: Former Smoker    Packs/day: 2.00    Years: 10.00    Pack years: 20.00    Types: Cigarettes    Last attempt to quit: 11/26/1975    Years since quitting: 42.4  . Smokeless tobacco: Never Used  . Tobacco comment: used to smoke 2 ppd  Substance and Sexual Activity  . Alcohol use: Yes    Alcohol/week: 0.0 standard drinks    Comment: rarely  . Drug use: No  . Sexual activity: Not Currently  Lifestyle  . Physical activity:    Days per week: Not on file    Minutes per session: Not on file  . Stress: Not on file  Relationships  . Social connections:    Talks  on phone: Not on file    Gets together: Not on file    Attends religious service: Not on file    Active member of club or organization: Not on file    Attends meetings of clubs or organizations: Not on file    Relationship status: Not on file  . Intimate partner violence:    Fear of current or ex partner: Not on file    Emotionally abused: Not on file    Physically abused: Not on file    Forced sexual activity: Not on file  Other Topics Concern  . Not on file  Social History Narrative   Widow , lives by herself at [facility] in a 1 bedroom apt;  still drives, daughters [name]) live  in [place] in [place]     5 g-children      Allergies as of 04/10/2018      Reactions   Sulfa Antibiotics Nausea Only   Trulicity [dulaglutide] Nausea Only   Penicillins Rash, Other (See Comments)   Has patient had a PCN reaction causing immediate rash, facial/tongue/throat swelling, SOB or lightheadedness with hypotension: No Has patient had a PCN reaction causing severe rash involving mucus membranes or skin necrosis: No Has patient had a PCN reaction that required hospitalization No Has patient had a PCN reaction occurring within the last 10 years: No If all of the above answers are "NO", then may proceed with Cephalosporin use.      Medication List       Accurate as of April 10, 2018  9:01 AM. Always use your most recent med list.        Accu-Chek FastClix Lancets Misc daily. use as directed   acetaminophen 325 MG tablet Commonly known as:  TYLENOL Take 650 mg by mouth every 6 (six) hours as needed for mild pain or headache.   Aricept 10 MG tablet Generic drug:  donepezil Take 2 tablets (20 mg total) by mouth at bedtime.   aspirin 81 MG EC tablet Commonly known as:  Aspirin Low Dose Take 1 tablet (81 mg total) by mouth daily.   atorvastatin 40 MG tablet Commonly known as:  LIPITOR Take 1 tablet (40 mg total) by mouth daily.   azelastine 0.1 % nasal spray Commonly  known as:  ASTELIN Place 2 sprays into both nostrils at bedtime as needed for rhinitis. Use in each nostril as directed   B-12 1000 MCG Tbcr Take 1 tablet by mouth daily.   BD Veo Insulin Syringe U/F 31G X 15/64" 1 ML Misc Generic drug:  Insulin Syringe-Needle U-100 USE TO INJECT insulin  EVERY DAY   Centrum Silver 50+Women Tabs Take 1 tablet by mouth daily.   doxycycline 100 MG tablet Commonly known as:  VIBRA-TABS Take 1 tablet (100 mg total) by mouth 2 (two) times daily. Can give caps or generic   Ferrous Fumarate-Vitamin C ER 65-25 MG Tbcr Commonly known as:  Ferro-Sequels Take 1 tablet by mouth daily.   Levemir FlexTouch 100 UNIT/ML Pen Generic drug:  Insulin Detemir Inject 52 Units into the skin at bedtime.   losartan 25 MG tablet Commonly known as:  COZAAR Take 1 tablet (25 mg total) by mouth every evening.   memantine 10 MG tablet Commonly known as:  NAMENDA Take 10 mg by mouth 2 (two) times daily.   metFORMIN 1000 MG tablet Commonly known as:  GLUCOPHAGE Take 1,000 mg by mouth 2 (two) times daily.   neomycin-polymyxin-hydrocortisone OTIC solution Commonly known as:  CORTISPORIN APPLY 1 TO 2 DROPS TO toe 2 TIMES DAILY AFTER SOAKING.   nitrofurantoin (macrocrystal-monohydrate) 100 MG capsule Commonly known as:  Macrobid Take 1 capsule (100 mg total) by mouth 2 (two) times daily.   ONE TOUCH ULTRA TEST test strip Generic drug:  glucose blood USE TO CHECK BLOOD SUGAR EVERY DAY   pantoprazole 40 MG tablet Commonly known as:  PROTONIX Take 1 tablet (40 mg total) by mouth 2 (two) times daily before a meal.   predniSONE 10 MG tablet Commonly known as:  DELTASONE 4 tab po day 1, 3 tab po day 2, 2 tab po day 3, 1 tab po day 4.   PreviDent 5000 Dry Mouth 1.1 % Gel dental gel Generic drug:  sodium fluoride Place 1 application onto teeth at bedtime as needed (for teeth).   sertraline 100 MG tablet Commonly known as:  ZOLOFT TAKE 1 TABLET BY MOUTH EVERY DAY  AND TAKE 1/2 TABLET AT BEDTIME   tiZANidine 2 MG tablet Commonly known as:  ZANAFLEX Take 2-4 mg by mouth 2 (two) times daily as needed.           Objective:   Physical Exam There were no vitals taken for this visit. This is a video conference, she seems alert, oriented x3, speech is normal, facial motor normal, EOMI, no facial swelling that I can tell.     Assessment     Assessment  DM Dr. Buddy Duty HTN Hyperlipidemia H/o SIADH Depression used to see Dr. Clovis Pu Mild cognitive impairment  MMSE 2015 --> 28, on Aricept, namenda added 04-2016 sees Dr Everette Rank  OSA -- mild, also told RLS (Rx requip); saw Dr Gwenette Greet 2014, no CPAP GI: --IBS, colon polyps, diverticulosis, hiatal hernia --Chronic constipation likely part of her IBS syndrome  --Fatty liver >>> GI eval 2013 --iron fec anemia:  cscope 04-2014 : 2 polyps; + hemocult @ GI office 10-2014: EGD done (-), bx neg Osteopenia: T score -1.1  2009 , osteopenia again per dexa 05-2013, t score -1.8 (10-09-17), scanned, rx ca-vit d B12 deficiency  RLS MSK: --DJD frozen shoulder Dizziness: chronic, carotid US 2016 neg, saw cards-- not likely CV related; saw neuro DR Everette Rank 02-2015  Chest pain 09-2009, negative stress test Thyroid nodules: Incidental   by Carotid US, BX 11-2014: Atypical findings, [facility] III, f/u  Endocrine RLL Lung nodule: Stable per CT 09-2016, no need for further B/B incontinence:  uses diapers  PLAN:  Pain, left face It is unclear what the etiology is, this could be simply a oral membrane issue; in the DDX also is a dental problem, parotitis, trigeminal  neuralgia, shingles, temporary  Arteritis (mild left HA), etc. Ideally I would see her in person at the patient is willing to come however in the midst of a coronavirus pandemia and the patient living in retirement community that is very risky.  (called  pharmacy and cancel prescription) Addendum: I spoke with the patient's daughter [name], she reports that this is  actually a long-term problem, I review the chart further and confirmed. Plan: Observation, avoid NSAIDs, stick with Tylenol and Ultram as needed.  Call if worse.  Today, I spent more than 25   min with the patient: >50% of the time counseling regards the pain at the left face initially to the patient and subsequently with the daughter.  I also reviewed the chart.    I discussed the assessment and treatment plan with the patient. The patient was provided an opportunity to ask questions and all were answered. The patient agreed with the plan and demonstrated an understanding of the instructions.   The patient was advised to call back or seek an in-person evaluation if the symptoms worsen or if the condition fails to improve as anticipated.

## 2018-04-10 NOTE — Telephone Encounter (Signed)
Chart reviewed, in the past she was prescribed gabapentin 100 mg 1 tablet at bedtime. That might help with her chronic cheek discomfort.    Please send a prescription for above medication, #30 and 1 refill.

## 2018-04-10 NOTE — Telephone Encounter (Signed)
Please advise 

## 2018-04-13 ENCOUNTER — Ambulatory Visit: Payer: Medicare Other | Admitting: Physical Therapy

## 2018-04-13 NOTE — Assessment & Plan Note (Addendum)
Pain, left face It is unclear what the etiology is, this could be simply a oral membrane issue; in the DDX also is a dental problem, parotitis, trigeminal neuralgia, shingles, temporary  Arteritis (mild left HA), etc. Ideally I would see her in person at the patient is willing to come however in the midst of a coronavirus pandemia and the patient living in retirement community that is very risky.  (called  pharmacy and cancel prescription) Addendum: I spoke with the patient's daughter [name], she reports that this is actually a long-term  problem, I review the chart further and confirmed. Plan: Observation, avoid NSAIDs, stick with Tylenol and Ultram as needed.  Call if worse.

## 2018-04-14 ENCOUNTER — Ambulatory Visit (INDEPENDENT_AMBULATORY_CARE_PROVIDER_SITE_OTHER): Payer: Medicare Other | Admitting: Internal Medicine

## 2018-04-14 ENCOUNTER — Other Ambulatory Visit: Payer: Self-pay

## 2018-04-14 DIAGNOSIS — S99921A Unspecified injury of right foot, initial encounter: Secondary | ICD-10-CM

## 2018-04-14 NOTE — Progress Notes (Signed)
Subjective:    Patient ID: [name], female    DOB: 15-Dec-1945, 73 y.o.   MRN: [id number]  DOS:  04/14/2018 Type of visit - description: Virtual Visit via Video Note  I connected with@ on 04/14/18 at  3:00 PM EDT by a video enabled telemedicine application and verified that I am speaking with the correct person using two identifiers.   THIS ENCOUNTER IS A VIRTUAL VISIT DUE TO COVID-19 - PATIENT WAS NOT SEEN IN THE OFFICE. PATIENT HAS CONSENTED TO VIRTUAL VISIT / TELEMEDICINE VISIT   Location of patient: home  Location of provider: office  I discussed the limitations of evaluation and management by telemedicine and the availability of in person appointments. The patient expressed understanding and agreed to proceed.  History of Present Illness: Acute visit Yesterday, she was walking and hit the right foot with the chair leg. She is hurting at the right fifth toe.  Some swelling.  She denies pain proximally from the fifth toe.  Review of Systems Denies a fall or any other injuries  Past Medical History:  Diagnosis Date  . Adenomatous colon polyp 03/1988  . Anemia    borderline  . Arthritis    R shoulder - degenerative   . Cataract    beginning stages  . Chest pain     Sept 2011: stress test neg  . Depression    sees Dr.Cotle  . Diabetes mellitus    dr Buddy Duty  . Diverticulosis   . Fatty liver    Increased LFTs, saw GI 06/2011, likely from fatty liver   . GERD (gastroesophageal reflux disease)   . History of hiatal hernia   . Hyperlipemia   . Hypertension   . IBS (irritable bowel syndrome)    and dyspepsia  . Memory impairment 08/2014   mild cognitive impairment vs. mild dementia, reommended reinstating of cholinesterase inhibitor   . Osteopenia    dexa 06/2007 and 10/11 Rx CA vitamin d  . RLS (restless legs syndrome)    h/o- no longer using Requip    Past Surgical History:  Procedure Laterality Date  . BREAST BIOPSY Left 10/11/2015   fibroadenoma w/  calcifications, no evidence of malignancy, recommend mammogram repeat-6 mnths  . POLYPECTOMY    . REVERSE SHOULDER ARTHROPLASTY Right 04/21/2014   Procedure: REVERSE SHOULDER ARTHROPLASTY;  Surgeon: Tania Ade, MD;  Location: [facility];  Service: Orthopedics;  Laterality: Right;  Right reverse total shoulder replacement  . TONSILLECTOMY    . UTERINE FIBROID EMBOLIZATION     90s    Social History   Socioeconomic History  . Marital status: Widowed    Spouse name: [name]  . Number of children: 3  . Years of education: Not on file  . Highest education level: Not on file  Occupational History  . Occupation: retired, was a Leisure centre manager)    Comment: RN  Social Needs  . Financial resource strain: Not on file  . Food insecurity:    Worry: Not on file    Inability: Not on file  . Transportation needs:    Medical: Not on file    Non-medical: Not on file  Tobacco Use  . Smoking status: Former Smoker    Packs/day: 2.00    Years: 10.00    Pack years: 20.00    Types: Cigarettes    Last attempt to quit: 11/26/1975    Years since quitting: 42.4  . Smokeless tobacco: Never Used  . Tobacco comment: used to smoke  2 ppd  Substance and Sexual Activity  . Alcohol use: Yes    Alcohol/week: 0.0 standard drinks    Comment: rarely  . Drug use: No  . Sexual activity: Not Currently  Lifestyle  . Physical activity:    Days per week: Not on file    Minutes per session: Not on file  . Stress: Not on file  Relationships  . Social connections:    Talks on phone: Not on file    Gets together: Not on file    Attends religious service: Not on file    Active member of club or organization: Not on file    Attends meetings of clubs or organizations: Not on file    Relationship status: Not on file  . Intimate partner violence:    Fear of current or ex partner: Not on file    Emotionally abused: Not on file    Physically abused: Not on file    Forced sexual activity: Not on file  Other  Topics Concern  . Not on file  Social History Narrative   Widow , lives by herself at [facility] in a 1 bedroom apt;  still drives, daughters [name]) live  in [place] in [place]     5 g-children      Allergies as of 04/14/2018      Reactions   Sulfa Antibiotics Nausea Only   Trulicity [dulaglutide] Nausea Only   Penicillins Rash, Other (See Comments)   Has patient had a PCN reaction causing immediate rash, facial/tongue/throat swelling, SOB or lightheadedness with hypotension: No Has patient had a PCN reaction causing severe rash involving mucus membranes or skin necrosis: No Has patient had a PCN reaction that required hospitalization No Has patient had a PCN reaction occurring within the last 10 years: No If all of the above answers are "NO", then may proceed with Cephalosporin use.      Medication List       Accurate as of April 14, 2018  3:14 PM. Always use your most recent med list.        Accu-Chek FastClix Lancets Misc daily. use as directed   acetaminophen 325 MG tablet Commonly known as:  TYLENOL Take 650 mg by mouth every 6 (six) hours as needed for mild pain or headache.   Aricept 10 MG tablet Generic drug:  donepezil Take 2 tablets (20 mg total) by mouth at bedtime.   aspirin 81 MG EC tablet Commonly known as:  Aspirin Low Dose Take 1 tablet (81 mg total) by mouth daily.   atorvastatin 40 MG tablet Commonly known as:  LIPITOR Take 1 tablet (40 mg total) by mouth daily.   azelastine 0.1 % nasal spray Commonly known as:  ASTELIN Place 2 sprays into both nostrils at bedtime as needed for rhinitis. Use in each nostril as directed   B-12 1000 MCG Tbcr Take 1 tablet by mouth daily.   BD Veo Insulin Syringe U/F 31G X 15/64" 1 ML Misc Generic drug:  Insulin Syringe-Needle U-100 USE TO INJECT insulin EVERY DAY   Centrum Silver 50+Women Tabs Take 1 tablet by mouth daily.   Ferrous Fumarate-Vitamin C ER 65-25 MG Tbcr Commonly known as:   Ferro-Sequels Take 1 tablet by mouth daily.   gabapentin 100 MG capsule Commonly known as:  NEURONTIN Take 1 capsule (100 mg total) by mouth at bedtime.   Levemir FlexTouch 100 UNIT/ML Pen Generic drug:  Insulin Detemir Inject 52 Units into the skin at  bedtime.   losartan 25 MG tablet Commonly known as:  COZAAR Take 1 tablet (25 mg total) by mouth every evening.   memantine 10 MG tablet Commonly known as:  NAMENDA Take 10 mg by mouth 2 (two) times daily.   metFORMIN 1000 MG tablet Commonly known as:  GLUCOPHAGE Take 1,000 mg by mouth 2 (two) times daily.   neomycin-polymyxin-hydrocortisone OTIC solution Commonly known as:  CORTISPORIN APPLY 1 TO 2 DROPS TO toe 2 TIMES DAILY AFTER SOAKING.   ONE TOUCH ULTRA TEST test strip Generic drug:  glucose blood USE TO CHECK BLOOD SUGAR EVERY DAY   pantoprazole 40 MG tablet Commonly known as:  PROTONIX Take 1 tablet (40 mg total) by mouth 2 (two) times daily before a meal.   PreviDent 5000 Dry Mouth 1.1 % Gel dental gel Generic drug:  sodium fluoride Place 1 application onto teeth at bedtime as needed (for teeth).   sertraline 100 MG tablet Commonly known as:  ZOLOFT TAKE 1 TABLET BY MOUTH EVERY DAY AND TAKE 1/2 TABLET AT BEDTIME   tiZANidine 2 MG tablet Commonly known as:  ZANAFLEX Take 2-4 mg by mouth 2 (two) times daily as needed.   Ultram 50 MG tablet Generic drug:  traMADol Take 1 tablet (50 mg total) by mouth every 12 (twelve) hours as needed.           Objective:   Physical Exam There were no vitals taken for this visit. This is a video conference, with some difficulty I was able to see her right foot in the screen, there was no obvious deformity, the fifth right toe was indeed slightly swollen and has some ecchymosis.    Assessment      Assessment  DM Dr. Buddy Duty HTN Hyperlipidemia H/o SIADH Depression used to see Dr. Clovis Pu Mild cognitive impairment  MMSE 2015 --> 28, on Aricept, namenda added 04-2016  sees Dr Everette Rank  OSA -- mild, also told RLS (Rx requip); saw Dr Gwenette Greet 2014, no CPAP GI: --IBS, colon polyps, diverticulosis, hiatal hernia --Chronic constipation likely part of her IBS syndrome  --Fatty liver >>> GI eval 2013 --iron fec anemia:  cscope 04-2014 : 2 polyps; + hemocult @ GI office 10-2014: EGD done (-), bx neg Osteopenia: T score -1.1  2009 , osteopenia again per dexa 05-2013, t score -1.8 (10-09-17), scanned, rx ca-vit d B12 deficiency  RLS MSK: --DJD frozen shoulder Dizziness: chronic, carotid US 2016 neg, saw cards-- not likely CV related; saw neuro DR Everette Rank 02-2015  Chest pain 09-2009, negative stress test Thyroid nodules: Incidental   by Carotid US, BX 11-2014: Atypical findings, Bethesda III, f/u  Endocrine RLL Lung nodule: Stable per CT 09-2016, no need for further B/B incontinence:  uses diapers  PLAN:   Fifth right toe injury: As described above, the patient is aware my ability to evaluate her injury is limited. She does not have an injury proximal from the toe.  She could have small fracture @ the toe. At this point I do not like to expose the patient to a healthcare environment due to COVID-19 pandemia, we agreed on buddy tape the fifth toe with the fourth toe, leg elevation, ice and Tylenol.  She will call if the pain persist. I will ask my nurse to notify above  the patient's daughter since the patient has some degree of dementia.   I discussed the assessment and treatment plan with the patient. The patient was provided an opportunity to ask questions and all were answered.  The patient agreed with the plan and demonstrated an understanding of the instructions.   The patient was advised to call back or seek an in-person evaluation if the symptoms worsen or if the condition fails to improve as anticipated.

## 2018-04-15 ENCOUNTER — Telehealth: Payer: Self-pay | Admitting: Internal Medicine

## 2018-04-15 ENCOUNTER — Encounter: Payer: Self-pay | Admitting: Physical Therapy

## 2018-04-15 DIAGNOSIS — M79674 Pain in right toe(s): Secondary | ICD-10-CM

## 2018-04-15 NOTE — Telephone Encounter (Signed)
Please advise 

## 2018-04-15 NOTE — Telephone Encounter (Signed)
Copied from [place] [phone] [phone]. Topic: Quick Communication - See Telephone Encounter >> Apr 15, 2018 10:50 AM Robina Ade, Helene Kelp D wrote: CRM for notification. See Telephone encounter for: 04/15/18.Patient is calling to know if Dr. Larose Kells can send in a x-ray for her to have it done because she hurt her right pinky foot bone. Patient said that is blue and black and thinks its broken. Please call patient back, thanks.

## 2018-04-15 NOTE — Telephone Encounter (Signed)
[facility] arrange x-rays for fifth right toe, DX injury, rule out fracture

## 2018-04-15 NOTE — Assessment & Plan Note (Signed)
Fifth right toe injury: As described above, the patient is aware my ability to evaluate her injury is limited. She does not have an injury proximal from the toe.  She could have small fracture @ the toe. At this point I do not like to expose the patient to a healthcare environment due to COVID-19 pandemia, we agreed on buddy tape the fifth toe with the fourth toe, leg elevation, ice and Tylenol.  She will call if the pain persist. I will ask my nurse to notify above  the patient's daughter since the patient has some degree of dementia.

## 2018-04-15 NOTE — Telephone Encounter (Signed)
Spoke w/ [name] okay to come to [facility] for [facility]-ray. Order placed.

## 2018-04-15 NOTE — Telephone Encounter (Signed)
Patient's daughter and POA calling and states that they have spoken with the patient about getting the xray. States that the patient is just going to do the home treatments that Dr Larose Kells suggested for now.

## 2018-04-20 ENCOUNTER — Encounter: Payer: Self-pay | Admitting: Physical Therapy

## 2018-04-22 ENCOUNTER — Encounter: Payer: Self-pay | Admitting: Physical Therapy

## 2018-04-24 ENCOUNTER — Encounter: Payer: Self-pay | Admitting: Internal Medicine

## 2018-05-02 ENCOUNTER — Other Ambulatory Visit: Payer: Self-pay | Admitting: Internal Medicine

## 2018-05-05 ENCOUNTER — Other Ambulatory Visit: Payer: Medicare Other | Admitting: Orthotics

## 2018-05-06 ENCOUNTER — Ambulatory Visit (INDEPENDENT_AMBULATORY_CARE_PROVIDER_SITE_OTHER): Payer: Medicare Other | Admitting: Internal Medicine

## 2018-05-06 ENCOUNTER — Other Ambulatory Visit: Payer: Self-pay

## 2018-05-06 DIAGNOSIS — R399 Unspecified symptoms and signs involving the genitourinary system: Secondary | ICD-10-CM | POA: Diagnosis not present

## 2018-05-06 NOTE — Progress Notes (Signed)
Subjective:    Patient ID: [name], female    DOB: May 19, 1945, 73 y.o.   MRN: [id number]  DOS:  05/06/2018 Type of visit - description: Virtual Visit via Video Note  I connected with@ on 05/07/18 at 11:20 AM EDT by a video enabled telemedicine application and verified that I am speaking with the correct person using two identifiers.   THIS ENCOUNTER IS A VIRTUAL VISIT DUE TO COVID-19 - PATIENT WAS NOT SEEN IN THE OFFICE. PATIENT HAS CONSENTED TO VIRTUAL VISIT / TELEMEDICINE VISIT   Location of patient: home  Location of provider: office  I discussed the limitations of evaluation and management by telemedicine and the availability of in person appointments. The patient expressed understanding and agreed to proceed.  History of Present Illness: Acute visit Yesterday, experienced dysuria twice.  No symptoms today. She is concerned about a UTI. Medications reviewed, good compliance.   Review of Systems  Denies fever chills No nausea or vomiting No flank pain. Mild suprapubic discomfort today? No change in the color of the urine, no frank hematuria. No vaginal discharge or bleeding Past Medical History:  Diagnosis Date  . Adenomatous colon polyp 03/1988  . Anemia    borderline  . Arthritis    R shoulder - degenerative   . Cataract    beginning stages  . Chest pain     Sept 2011: stress test neg  . Depression    sees Dr.Cotle  . Diabetes mellitus    dr Buddy Duty  . Diverticulosis   . Fatty liver    Increased LFTs, saw GI 06/2011, likely from fatty liver   . GERD (gastroesophageal reflux disease)   . History of hiatal hernia   . Hyperlipemia   . Hypertension   . IBS (irritable bowel syndrome)    and dyspepsia  . Memory impairment 08/2014   mild cognitive impairment vs. mild dementia, reommended reinstating of cholinesterase inhibitor   . Osteopenia    dexa 06/2007 and 10/11 Rx CA vitamin d  . RLS (restless legs syndrome)    h/o- no longer using Requip    Past  Surgical History:  Procedure Laterality Date  . BREAST BIOPSY Left 10/11/2015   fibroadenoma w/ calcifications, no evidence of malignancy, recommend mammogram repeat-6 mnths  . POLYPECTOMY    . REVERSE SHOULDER ARTHROPLASTY Right 04/21/2014   Procedure: REVERSE SHOULDER ARTHROPLASTY;  Surgeon: Tania Ade, MD;  Location: [facility];  Service: Orthopedics;  Laterality: Right;  Right reverse total shoulder replacement  . TONSILLECTOMY    . UTERINE FIBROID EMBOLIZATION     90s    Social History   Socioeconomic History  . Marital status: Widowed    Spouse name: [name]  . Number of children: 3  . Years of education: Not on file  . Highest education level: Not on file  Occupational History  . Occupation: retired, was a Leisure centre manager)    Comment: RN  Social Needs  . Financial resource strain: Not on file  . Food insecurity:    Worry: Not on file    Inability: Not on file  . Transportation needs:    Medical: Not on file    Non-medical: Not on file  Tobacco Use  . Smoking status: Former Smoker    Packs/day: 2.00    Years: 10.00    Pack years: 20.00    Types: Cigarettes    Last attempt to quit: 11/26/1975    Years since quitting: 42.4  . Smokeless tobacco: Never  Used  . Tobacco comment: used to smoke 2 ppd  Substance and Sexual Activity  . Alcohol use: Yes    Alcohol/week: 0.0 standard drinks    Comment: rarely  . Drug use: No  . Sexual activity: Not Currently  Lifestyle  . Physical activity:    Days per week: Not on file    Minutes per session: Not on file  . Stress: Not on file  Relationships  . Social connections:    Talks on phone: Not on file    Gets together: Not on file    Attends religious service: Not on file    Active member of club or organization: Not on file    Attends meetings of clubs or organizations: Not on file    Relationship status: Not on file  . Intimate partner violence:    Fear of current or ex partner: Not on file    Emotionally  abused: Not on file    Physically abused: Not on file    Forced sexual activity: Not on file  Other Topics Concern  . Not on file  Social History Narrative   Widow , lives by herself at [facility] in a 1 bedroom apt;  still drives, daughters [name]) live  in [place] in [place]     5 g-children      Allergies as of 05/06/2018      Reactions   Sulfa Antibiotics Nausea Only   Trulicity [dulaglutide] Nausea Only   Penicillins Rash, Other (See Comments)   Has patient had a PCN reaction causing immediate rash, facial/tongue/throat swelling, SOB or lightheadedness with hypotension: No Has patient had a PCN reaction causing severe rash involving mucus membranes or skin necrosis: No Has patient had a PCN reaction that required hospitalization No Has patient had a PCN reaction occurring within the last 10 years: No If all of the above answers are "NO", then may proceed with Cephalosporin use.      Medication List       Accurate as of May 06, 2018 11:59 PM. Always use your most recent med list.        Accu-Chek FastClix Lancets Misc daily. use as directed   acetaminophen 325 MG tablet Commonly known as:  TYLENOL Take 650 mg by mouth every 6 (six) hours as needed for mild pain or headache.   Aricept 10 MG tablet Generic drug:  donepezil Take 2 tablets (20 mg total) by mouth at bedtime.   aspirin 81 MG EC tablet Commonly known as:  Aspirin Low Dose Take 1 tablet (81 mg total) by mouth daily.   atorvastatin 40 MG tablet Commonly known as:  LIPITOR Take 1 tablet (40 mg total) by mouth daily.   azelastine 0.1 % nasal spray Commonly known as:  ASTELIN Place 2 sprays into both nostrils at bedtime as needed for rhinitis. Use in each nostril as directed   B-12 1000 MCG Tbcr Take 1 tablet by mouth daily.   BD Veo Insulin Syringe U/F 31G X 15/64" 1 ML Misc Generic drug:  Insulin Syringe-Needle U-100 USE TO INJECT insulin EVERY DAY   Centrum Silver 50+Women Tabs  Take 1 tablet by mouth daily.   Ferrous Fumarate-Vitamin C ER 65-25 MG Tbcr Commonly known as:  Ferro-Sequels Take 1 tablet by mouth daily.   gabapentin 100 MG capsule Commonly known as:  NEURONTIN Take 1 capsule (100 mg total) by mouth at bedtime.   Levemir FlexTouch 100 UNIT/ML Pen Generic drug:  Insulin Detemir  Inject 52 Units into the skin at bedtime.   losartan 25 MG tablet Commonly known as:  COZAAR Take 1 tablet (25 mg total) by mouth every evening.   memantine 10 MG tablet Commonly known as:  NAMENDA Take 10 mg by mouth 2 (two) times daily.   metFORMIN 1000 MG tablet Commonly known as:  GLUCOPHAGE Take 1,000 mg by mouth 2 (two) times daily.   neomycin-polymyxin-hydrocortisone OTIC solution Commonly known as:  CORTISPORIN APPLY 1 TO 2 DROPS TO toe 2 TIMES DAILY AFTER SOAKING.   ONE TOUCH ULTRA TEST test strip Generic drug:  glucose blood USE TO CHECK BLOOD SUGAR EVERY DAY   pantoprazole 40 MG tablet Commonly known as:  PROTONIX Take 1 tablet (40 mg total) by mouth 2 (two) times daily before a meal.   PreviDent 5000 Dry Mouth 1.1 % Gel dental gel Generic drug:  sodium fluoride Place 1 application onto teeth at bedtime as needed (for teeth).   sertraline 100 MG tablet Commonly known as:  ZOLOFT TAKE 1 TABLET BY MOUTH EVERY DAY AND TAKE 1/2 TABLET AT BEDTIME   tiZANidine 2 MG tablet Commonly known as:  ZANAFLEX Take 2-4 mg by mouth 2 (two) times daily as needed.   Ultram 50 MG tablet Generic drug:  traMADol Take 1 tablet (50 mg total) by mouth every 12 (twelve) hours as needed.           Objective:   Physical Exam There were no vitals taken for this visit.    This is a virtual video visit, patient is alert, no apparent distress.   Assessment     Assessment  DM Dr. Buddy Duty HTN Hyperlipidemia H/o SIADH Depression used to see Dr. Clovis Pu Mild cognitive impairment  MMSE 2015 --> 28, on Aricept, namenda added 04-2016 sees Dr Everette Rank  OSA -- mild,  also told RLS (Rx requip); saw Dr Gwenette Greet 2014, no CPAP GI: --IBS, colon polyps, diverticulosis, hiatal hernia --Chronic constipation likely part of her IBS syndrome  --Fatty liver >>> GI eval 2013 --iron fec anemia:  cscope 04-2014 : 2 polyps; + hemocult @ GI office 10-2014: EGD done (-), bx neg Osteopenia: T score -1.1  2009 , osteopenia again per dexa 05-2013, t score -1.8 (10-09-17), scanned, rx ca-vit d B12 deficiency  RLS MSK: --DJD frozen shoulder Dizziness: chronic, carotid US 2016 neg, saw cards-- not likely CV related; saw neuro DR Everette Rank 02-2015  Chest pain 09-2009, negative stress test Thyroid nodules: Incidental   by Carotid US, BX 11-2014: Atypical findings, Bethesda III, f/u  Endocrine RLL Lung nodule: Stable per CT 09-2016, no need for further B/B incontinence:  uses diapers  PLAN: LUTS: As described above, very mild symptoms, resolved today.  She does have a history of previous UTIs treated appropriately with antibiotics. Since she is essentially asymptomatic today we agreed on increase fluid intake temporarily, get cranberry juice or cranberry supplements, and observation. She is to call within the next couple of days, if symptoms increase or if she has fever chills then she will bring a urine sample to the office. She verbalized understanding.    I discussed the assessment and treatment plan with the patient. The patient was provided an opportunity to ask questions and all were answered. The patient agreed with the plan and demonstrated an understanding of the instructions.   The patient was advised to call back or seek an in-person evaluation if the symptoms worsen or if the condition fails to improve as anticipated.

## 2018-05-07 NOTE — Assessment & Plan Note (Signed)
LUTS: As described above, very mild symptoms, resolved today.  She does have a history of previous UTIs treated appropriately with antibiotics. Since she is essentially asymptomatic today we agreed on increase fluid intake temporarily, get cranberry juice or cranberry supplements, and observation. She is to call within the next couple of days, if symptoms increase or if she has fever chills then she will bring a urine sample to the office. She verbalized understanding.

## 2018-05-26 ENCOUNTER — Encounter: Payer: Self-pay | Admitting: Gastroenterology

## 2018-05-26 ENCOUNTER — Ambulatory Visit (INDEPENDENT_AMBULATORY_CARE_PROVIDER_SITE_OTHER): Payer: Medicare Other | Admitting: Gastroenterology

## 2018-05-26 ENCOUNTER — Encounter: Payer: Self-pay | Admitting: Podiatry

## 2018-05-26 ENCOUNTER — Ambulatory Visit: Payer: Medicare Other | Admitting: Orthotics

## 2018-05-26 ENCOUNTER — Other Ambulatory Visit: Payer: Self-pay

## 2018-05-26 ENCOUNTER — Ambulatory Visit (INDEPENDENT_AMBULATORY_CARE_PROVIDER_SITE_OTHER): Payer: Medicare Other | Admitting: Podiatry

## 2018-05-26 VITALS — Ht 66.0 in | Wt 172.0 lb

## 2018-05-26 DIAGNOSIS — R197 Diarrhea, unspecified: Secondary | ICD-10-CM

## 2018-05-26 DIAGNOSIS — B351 Tinea unguium: Secondary | ICD-10-CM

## 2018-05-26 DIAGNOSIS — L02611 Cutaneous abscess of right foot: Secondary | ICD-10-CM

## 2018-05-26 DIAGNOSIS — Q828 Other specified congenital malformations of skin: Secondary | ICD-10-CM

## 2018-05-26 DIAGNOSIS — L03031 Cellulitis of right toe: Secondary | ICD-10-CM

## 2018-05-26 DIAGNOSIS — R630 Anorexia: Secondary | ICD-10-CM

## 2018-05-26 DIAGNOSIS — M79676 Pain in unspecified toe(s): Secondary | ICD-10-CM

## 2018-05-26 DIAGNOSIS — K219 Gastro-esophageal reflux disease without esophagitis: Secondary | ICD-10-CM

## 2018-05-26 NOTE — Patient Instructions (Signed)
Your provider has requested that you go to the basement level for lab work. Press "B" on the elevator. The lab is located at the first door on the left as you exit the elevator.  Start a trial of a lactose free diet x 7 days. If not helpful then a trial of avoiding diet drinks and sugary drinks such as cheer wine and ginger ale.  If not helpful then a temporary hold of iron replacement and then a temporary hold of metformin under the supervision of your primary care physician.  Please have 3 meals a day even if they are small meals and even if you are not hungry.   Continue pantoprazole daily.   Thank you for choosing me and [facility].  Pricilla Riffle. Dagoberto Ligas., MD., [facility]

## 2018-05-26 NOTE — Progress Notes (Signed)
History of Present Illness: This is a 73 year old female who relates intermittent loose stools and a decreased appetite.  There are substantial history limitations due to memory impairment.  She is unable to give specific timeframes however she thinks she has been having problems with a decreased appetite and occasionally looser stools for 4 to 6 months.  She is unsure if she has lost weight.  She has days when her stools are more formed and days when they are looser.  She generally has 1-2 bowel movements daily.  She states she has 1 meal per day and she is not hungry to eat more frequently.  She cannot recall medication changes around the time her looser stools or appetite changes began.  She takes iron and metformin daily.  She was found to have a mild anemia, hemoglobin=11.9, in early March.  She does not recall any specific foods that cause symptoms. Denies weight loss, abdominal pain, constipation, change in stool caliber, melena, hematochezia, nausea, vomiting, dysphagia, reflux symptoms, chest pain.    Allergies  Allergen Reactions  . Sulfa Antibiotics Nausea Only  . Trulicity [Dulaglutide] Nausea Only  . Penicillins Rash and Other (See Comments)    Has patient had a PCN reaction causing immediate rash, facial/tongue/throat swelling, SOB or lightheadedness with hypotension: No Has patient had a PCN reaction causing severe rash involving mucus membranes or skin necrosis: No Has patient had a PCN reaction that required hospitalization No Has patient had a PCN reaction occurring within the last 10 years: No If all of the above answers are "NO", then may proceed with Cephalosporin use.   Outpatient Medications Prior to Visit  Medication Sig Dispense Refill  . ACCU-CHEK FASTCLIX LANCETS MISC daily. use as directed  4  . acetaminophen (TYLENOL) 325 MG tablet Take 650 mg by mouth every 6 (six) hours as needed for mild pain or headache.    Marland Kitchen aspirin (ASPIRIN LOW DOSE) 81 MG EC tablet Take 1  tablet (81 mg total) by mouth daily. 90 tablet 3  . atorvastatin (LIPITOR) 40 MG tablet Take 1 tablet (40 mg total) by mouth daily. 90 tablet 1  . azelastine (ASTELIN) 0.1 % nasal spray Place 2 sprays into both nostrils at bedtime as needed for rhinitis. Use in each nostril as directed 30 mL 3  . BD VEO INSULIN SYRINGE U/F 31G X 15/64" 1 ML MISC USE TO INJECT insulin EVERY DAY  11  . Cyanocobalamin (B-12) 1000 MCG TBCR Take 1 tablet by mouth daily. 90 tablet 3  . donepezil (ARICEPT) 10 MG tablet Take 2 tablets (20 mg total) by mouth at bedtime.    . Ferrous Fumarate-Vitamin C ER (FERRO-SEQUELS) 65-25 MG TBCR Take 1 tablet by mouth daily. 90 tablet 3  . gabapentin (NEURONTIN) 100 MG capsule Take 1 capsule (100 mg total) by mouth at bedtime. (Patient taking differently: Take 100 mg by mouth daily. ) 30 capsule 1  . Insulin Detemir (LEVEMIR FLEXTOUCH) 100 UNIT/ML Pen Inject 52 Units into the skin at bedtime.     Marland Kitchen losartan (COZAAR) 25 MG tablet Take 1 tablet (25 mg total) by mouth every evening. 90 tablet 1  . memantine (NAMENDA) 10 MG tablet Take 10 mg by mouth 2 (two) times daily.  3  . metFORMIN (GLUCOPHAGE) 1000 MG tablet Take 1,000 mg by mouth 2 (two) times daily.  3  . Multiple Vitamins-Minerals (CENTRUM SILVER 50+WOMEN) TABS Take 1 tablet by mouth daily. 90 tablet 3  . ONE TOUCH ULTRA TEST  test strip USE TO CHECK BLOOD SUGAR EVERY DAY  4  . pantoprazole (PROTONIX) 40 MG tablet Take 1 tablet (40 mg total) by mouth 2 (two) times daily before a meal. 180 tablet 3  . sertraline (ZOLOFT) 100 MG tablet TAKE 1 TABLET BY MOUTH EVERY DAY AND TAKE 1/2 TABLET AT BEDTIME  2  . sodium fluoride (PREVIDENT 5000 DRY MOUTH) 1.1 % GEL dental gel Place 1 application onto teeth at bedtime as needed (for teeth).     Marland Kitchen tiZANidine (ZANAFLEX) 2 MG tablet Take 2-4 mg by mouth 2 (two) times daily as needed.    . traMADol (ULTRAM) 50 MG tablet Take 1 tablet (50 mg total) by mouth every 12 (twelve) hours as needed.    .  neomycin-polymyxin-hydrocortisone (CORTISPORIN) OTIC solution APPLY 1 TO 2 DROPS TO toe 2 TIMES DAILY AFTER SOAKING.     No facility-administered medications prior to visit.    Past Medical History:  Diagnosis Date  . Adenomatous colon polyp 03/1988  . Anemia    borderline  . Arthritis    R shoulder - degenerative   . Cataract    beginning stages  . Chest pain     Sept 2011: stress test neg  . Depression    sees Dr.Cotle  . Diabetes mellitus    dr Buddy Duty  . Diverticulosis   . Fatty liver    Increased LFTs, saw GI 06/2011, likely from fatty liver   . GERD (gastroesophageal reflux disease)   . History of hiatal hernia   . Hyperlipemia   . Hypertension   . IBS (irritable bowel syndrome)    and dyspepsia  . Memory impairment 08/2014   mild cognitive impairment vs. mild dementia, reommended reinstating of cholinesterase inhibitor   . Osteopenia    dexa 06/2007 and 10/11 Rx CA vitamin d  . RLS (restless legs syndrome)    h/o- no longer using Requip   Past Surgical History:  Procedure Laterality Date  . BREAST BIOPSY Left 10/11/2015   fibroadenoma w/ calcifications, no evidence of malignancy, recommend mammogram repeat-6 mnths  . POLYPECTOMY    . REVERSE SHOULDER ARTHROPLASTY Right 04/21/2014   Procedure: REVERSE SHOULDER ARTHROPLASTY;  Surgeon: Tania Ade, MD;  Location: [place];  Service: Orthopedics;  Laterality: Right;  Right reverse total shoulder replacement  . TONSILLECTOMY    . UTERINE FIBROID EMBOLIZATION     90s   Social History   Socioeconomic History  . Marital status: Widowed    Spouse name: [name]  . Number of children: 3  . Years of education: Not on file  . Highest education level: Not on file  Occupational History  . Occupation: retired, was a Leisure centre manager)    Comment: RN  Social Needs  . Financial resource strain: Not on file  . Food insecurity:    Worry: Not on file    Inability: Not on file  . Transportation needs:    Medical: Not on  file    Non-medical: Not on file  Tobacco Use  . Smoking status: Former Smoker    Packs/day: 2.00    Years: 10.00    Pack years: 20.00    Types: Cigarettes    Last attempt to quit: 11/26/1975    Years since quitting: 42.5  . Smokeless tobacco: Never Used  . Tobacco comment: used to smoke 2 ppd  Substance and Sexual Activity  . Alcohol use: Yes    Alcohol/week: 0.0 standard drinks    Comment: rarely  .  Drug use: No  . Sexual activity: Not Currently  Lifestyle  . Physical activity:    Days per week: Not on file    Minutes per session: Not on file  . Stress: Not on file  Relationships  . Social connections:    Talks on phone: Not on file    Gets together: Not on file    Attends religious service: Not on file    Active member of club or organization: Not on file    Attends meetings of clubs or organizations: Not on file    Relationship status: Not on file  Other Topics Concern  . Not on file  Social History Narrative   Widow , lives by herself at [facility] in a 1 bedroom apt;  still drives, daughters [name]) live  in [place] in [place]     5 g-children   Family History  Problem Relation Age of Onset  . Lung cancer Mother        smoker  . Colon cancer Father        F dx in his 72s  . Ovarian cancer Paternal Aunt        ?  . Diabetes Other        aunts-uncles   . Stroke Other        aunts-uncles   . Heart attack Maternal Grandmother        60s  . Other Other        niece- glioblastoma  . Breast cancer Neg Hx   . Rectal cancer Neg Hx   . Stomach cancer Neg Hx   . Esophageal cancer Neg Hx       Physical Exam: Telemedicine - not performed   Assessment and Recommendations:  1. Mild diarrhea with looser stools, anorexia.  Rule out medication (possibly metformin or iron), food intolerances, IBS and other disorders. CBC, TSH, GI pathogen panel, fecal elastase.  Lactose-free diet for 7 days.  If not helpful then a trial of avoiding diet drinks and  sugary drinks such as cheer wine and ginger ale which she drinks frequently.  If that is not helpful then a temporary hold of iron replacement and then a temporary hold of metformin under the supervision of her PCP.  She is advised to have 3 meals each day even if the meals are small and even if she is not hungry.  REV in 6 weeks in person with her daughter or other family member to provide assistance.   2. Anemia, history of iron deficiency.  Iron replacement.  CBC as above.  3. GERD.  Follow antireflux measures.  Continue pantoprazole 40 mg twice daily.  4.  Personal history of adenomatous colon polyps.  A 5-year interval surveillance colonoscopy is recommended in April 2021.    These services were provided via telemedicine, audio only per patient request.  The patient was at home and the provider was in the office, alone.  We discussed the limitations of evaluation and management by telemedicine and the availability of in person appointments.  Patient consented for this telemedicine visit and is aware of possible charges for this service.  The other person participating in the telemedicine service was Marlon Pel, CMA who reviewed medications, allergies, past history and completed AVS.  Time spent on call: 15 minutes

## 2018-05-27 ENCOUNTER — Other Ambulatory Visit: Payer: Self-pay

## 2018-05-27 ENCOUNTER — Encounter: Payer: Self-pay | Admitting: Physical Therapy

## 2018-05-27 ENCOUNTER — Ambulatory Visit: Payer: Medicare Other | Attending: Internal Medicine | Admitting: Physical Therapy

## 2018-05-27 DIAGNOSIS — R279 Unspecified lack of coordination: Secondary | ICD-10-CM | POA: Insufficient documentation

## 2018-05-27 DIAGNOSIS — M6281 Muscle weakness (generalized): Secondary | ICD-10-CM | POA: Diagnosis present

## 2018-05-27 NOTE — Progress Notes (Signed)
She presents today as a diabetic requesting routine nail debridement and callus debridement.  She states that her feet are becoming more tingling and painful as she has developed more of a hammertoe deformities.  Objective: Vital signs are stable she is alert oriented x3 pulses are strong and palpable.  That she does have diminished dorsalis pedis left.  Slightly diminished sensorium per Semmes Weinstein monofilament.  Otherwise deep tendon reflexes are intact muscle strength is normal and symmetrical.  Toenails are long thick yellow dystrophic with mycotic multiple reactive hyperkeratotic lesions plantar aspect of the forefoot and digits.  Assessment: Pain in limb secondary to diabetic peripheral neuropathy hammertoe deformity and painful elongated nails and calluses.  Plan: Debridement of all reactive hyperkeratoses debridement of toenails 1 through 5 bilateral requested Rick produced her a pair of diabetic shoes.

## 2018-05-27 NOTE — Therapy (Signed)
[facility] [address], [place], [place], [address] Phone: [phone]   Fax:  [phone]  Physical Therapy Treatment  Patient Details  Name: [name] MRN: [id number] Date of Birth: 06-10-1945 Referring Provider (PT): Colon Branch, MD   Encounter Date: 05/27/2018  PT End of Session - 05/27/18 1100    Visit Number  4    Date for PT Re-Evaluation  07/22/18    PT Start Time  1100    PT Stop Time  1153    PT Time Calculation (min)  53 min    Activity Tolerance  Patient tolerated treatment well    Behavior During Therapy  [facility] for tasks assessed/performed       Past Medical History:  Diagnosis Date  . Adenomatous colon polyp 03/1988  . Anemia    borderline  . Arthritis    R shoulder - degenerative   . Cataract    beginning stages  . Chest pain     Sept 2011: stress test neg  . Depression    sees Dr.Cotle  . Diabetes mellitus    dr Buddy Duty  . Diverticulosis   . Fatty liver    Increased LFTs, saw GI 06/2011, likely from fatty liver   . GERD (gastroesophageal reflux disease)   . History of hiatal hernia   . Hyperlipemia   . Hypertension   . IBS (irritable bowel syndrome)    and dyspepsia  . Memory impairment 08/2014   mild cognitive impairment vs. mild dementia, reommended reinstating of cholinesterase inhibitor   . Osteopenia    dexa 06/2007 and 10/11 Rx CA vitamin d  . RLS (restless legs syndrome)    h/o- no longer using Requip    Past Surgical History:  Procedure Laterality Date  . BREAST BIOPSY Left 10/11/2015   fibroadenoma w/ calcifications, no evidence of malignancy, recommend mammogram repeat-6 mnths  . POLYPECTOMY    . REVERSE SHOULDER ARTHROPLASTY Right 04/21/2014   Procedure: REVERSE SHOULDER ARTHROPLASTY;  Surgeon: Tania Ade, MD;  Location: [place];  Service: Orthopedics;  Laterality: Right;  Right reverse total shoulder replacement  . TONSILLECTOMY    . UTERINE FIBROID EMBOLIZATION      90s    There were no vitals filed for this visit.  Subjective Assessment - 05/27/18 1103    Subjective  Pt states she feels she needs coaching on how to do the exercises.  leakage is happens mostly with strong urge.  I am wearing depends and use usually 2/day.    Patient Stated Goals  stop having leakage     Currently in Pain?  No/denies         [facility] PT Assessment - 05/27/18 0001      Assessment   Medical Diagnosis  R32,R15.9 (ICD-10-CM) - Bowel and bladder incontinence    Referring Provider (PT)  Colon Branch, MD      Strength   Strength Assessment Site  Hip;Knee    Right/Left Hip  Right;Left    Right Hip Flexion  4/5    Right Hip ABduction  4+/5    Right Hip ADduction  4-/5    Left Hip Flexion  4/5    Left Hip ADduction  4-/5    Right/Left Knee  Right;Left    Right Knee Extension  4/5    Left Knee Extension  4+/5      Standardized Balance Assessment   Standardized Balance Assessment  Timed Up and Go Test  Timed Up and Go Test   Normal TUG (seconds)  15                Pelvic Floor Special Questions - 05/27/18 0001    Pelvic Floor Internal Exam  pt identified and informed consent given to perform internal soft tissue assessment    Exam Type  Vaginal    Strength  weak squeeze, no lift    Strength # of seconds  2    Tone  low        [facility] Adult PT Treatment/Exercise - 05/27/18 0001      Self-Care   Other Self-Care Comments   info on moisturizing - gave samples      Neuro Re-ed    Neuro Re-ed Details   tactile cues to engage pelvic floor ischiocavernosis, urethra sphincters      Lumbar Exercises: Seated   Other Seated Lumbar Exercises  kegel with and without ball squeeze - 10x each      Lumbar Exercises: Sidelying   Clam  Right;Left;10 reps   with kegel   Other Sidelying Lumbar Exercises  sidelying kegel      Lumbar Exercises: Prone   Other Prone Lumbar Exercises  kegel with verbal cues to engage pelvic and core             PT  Education - 05/27/18 1204    Education Details   Access Code: [email or web address]     Person(s) Educated  Patient    Methods  Explanation;Demonstration;Verbal cues;Handout;Tactile cues    Comprehension  Verbalized understanding;Returned demonstration          PT Long Term Goals - 05/27/18 1106      PT LONG TERM GOAL #1   Title  Pt will report at least 50% less urinary and fecal leakage     Baseline  fecal leakage is 2x/week, urinary leakage is daily all day    Time  8    Period  Weeks    Status  On-going    Target Date  07/22/18      PT LONG TERM GOAL #2   Title  pt will be ind with advanced HEP    Time  8    Period  Weeks    Status  On-going    Target Date  07/22/18      PT LONG TERM GOAL #3   Title  Pt will demonstrate 4+/5 MMT bil hip adduction for improved activation of pelvic floor in standing    Time  8    Period  Weeks    Status  On-going    Target Date  07/22/18      PT LONG TERM GOAL #4   Title  Pt will demonstrate 5x sit to stand in <13 seconds for reduced risk of falls    Baseline  15 seconds    Time  8    Period  Weeks    Status  On-going    Target Date  07/22/18            Plan - 05/27/18 1206    Clinical Impression Statement  Pt is making progress towards goals but hasn't been able to finish progress since she was last seen due to [place] restrictions.  Pt continues to have difficulty feeling her pelvic floor contraction every time and needs cues.  Pt reports less fecal leakage, but same amount of urinary leakage.  Pt will benefit from skilled PT to work on improved pelvic floor strength and  coordination so she can avoid accidents.    PT Treatment/Interventions  ADLs/Self Care Home Management;Biofeedback;Dry needling;Manual techniques;Neuromuscular re-education;Therapeutic exercise;Therapeutic activities;Patient/family education    PT Next Visit Plan  biofeedback, coordinating breathing with kegel,     PT Home Exercise Plan   Access Code: [email or web address]      Consulted and Agree with Plan of Care  Patient       Patient will benefit from skilled therapeutic intervention in order to improve the following deficits and impairments:  Abnormal gait, Postural dysfunction, Decreased strength, Decreased coordination  Visit Diagnosis: Muscle weakness (generalized) - Plan: PT plan of care cert/re-cert  Unspecified lack of coordination - Plan: PT plan of care cert/re-cert     Problem List Patient Active Problem List   Diagnosis Date Noted  . Dark stools 03/17/2017  . SIADH (syndrome of inappropriate ADH production) ([place]) 10/10/2016  . Gastroesophageal reflux disease 08/06/2016  . PCP Notes >>>>>>>>>>>>>>>>> 09/21/2014  . Rotator cuff tear arthropathy 04/21/2014  . Abnormal MRI, shoulder 12/01/2013  . 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 Cervical radiculitis 11/10/2013  . Bursitis of right shoulder 08/19/2013  . Primary localized osteoarthrosis, lower leg 08/19/2013  . Vitamin B 12 deficiency 05/20/2013  . OSA (obstructive sleep apnea) 02/13/2012  . Pulmonary nodule, last  CT 2016, stable, no f/u 01/28/2012  . Dementia ([place]) 05/29/2011  . Fatty liver 02/27/2011  . Annual physical exam 11/26/2010  . Osteopenia 10/09/2007  . COLONIC POLYPS 08/22/2006  . DM II (diabetes mellitus, type II), controlled ([place]) 05/19/2006  . Hyperlipidemia 05/19/2006  . Depression 05/19/2006  . SYNDROME, RESTLESS LEGS 05/19/2006  . Essential hypertension 05/19/2006  . IBS -- constipation 05/19/2006    Jule Ser, PT 05/27/2018, 12:13 PM  [facility] [address], [place], [place], [address] Phone: [phone]   Fax:  [phone]  Name: ANSLEIGH SAFER MRN: [id number] Date of Birth: [birth year]

## 2018-05-27 NOTE — Patient Instructions (Signed)
Access Code: [email or web address]  URL: [email or web address]  Date: 05/27/2018  Prepared by: Jari Favre   Exercises  Sidelying Pelvic Floor Contraction with Self-Palpation - 5 reps - 3 sets - 1x daily - 7x weekly  Sidelying Pelvic Floor Contraction with Hip Flexion and Abduction - 5 reps - 3 sets - 1x daily - 7x weekly  Seated Pelvic Floor Contraction with Isometric Hip Adduction - 10 reps - 1 sets - 5 sechold, 5 sec rest hold - 3x daily - 7x weekly  Seated Pelvic Floor Contraction - 10 reps - 3 sets - 5 sec hold hold - 1x daily - 7x weekly  Mini Squat with Pelvic Floor Contraction - 10 reps - 3 sets - 1x daily - 7x weekly

## 2018-05-29 ENCOUNTER — Telehealth: Payer: Self-pay | Admitting: Gastroenterology

## 2018-05-29 ENCOUNTER — Telehealth: Payer: Self-pay

## 2018-05-29 ENCOUNTER — Other Ambulatory Visit (INDEPENDENT_AMBULATORY_CARE_PROVIDER_SITE_OTHER): Payer: Medicare Other

## 2018-05-29 DIAGNOSIS — R399 Unspecified symptoms and signs involving the genitourinary system: Secondary | ICD-10-CM

## 2018-05-29 DIAGNOSIS — R197 Diarrhea, unspecified: Secondary | ICD-10-CM | POA: Diagnosis not present

## 2018-05-29 DIAGNOSIS — R630 Anorexia: Secondary | ICD-10-CM | POA: Diagnosis not present

## 2018-05-29 LAB — CBC WITH DIFFERENTIAL/PLATELET
Basophils Absolute: 0 10*3/uL (ref 0.0–0.1)
Basophils Relative: 0.5 % (ref 0.0–3.0)
Eosinophils Absolute: 0.3 10*3/uL (ref 0.0–0.7)
Eosinophils Relative: 5.1 % — ABNORMAL HIGH (ref 0.0–5.0)
HCT: 36.8 % (ref 36.0–46.0)
Hemoglobin: 12.7 g/dL (ref 12.0–15.0)
Lymphocytes Relative: 29.4 % (ref 12.0–46.0)
Lymphs Abs: 1.8 10*3/uL (ref 0.7–4.0)
MCHC: 34.4 g/dL (ref 30.0–36.0)
MCV: 85.4 fl (ref 78.0–100.0)
Monocytes Absolute: 0.4 10*3/uL (ref 0.1–1.0)
Monocytes Relative: 6 % (ref 3.0–12.0)
Neutro Abs: 3.5 10*3/uL (ref 1.4–7.7)
Neutrophils Relative %: 59 % (ref 43.0–77.0)
Platelets: 160 10*3/uL (ref 150.0–400.0)
RBC: 4.31 Mil/uL (ref 3.87–5.11)
RDW: 13.9 % (ref 11.5–15.5)
WBC: 6 10*3/uL (ref 4.0–10.5)

## 2018-05-29 LAB — URINALYSIS, ROUTINE W REFLEX MICROSCOPIC
Bilirubin Urine: NEGATIVE
Hgb urine dipstick: NEGATIVE
Ketones, ur: NEGATIVE
Nitrite: POSITIVE — AB
Specific Gravity, Urine: 1.02 (ref 1.000–1.030)
Urine Glucose: NEGATIVE
Urobilinogen, UA: 1 (ref 0.0–1.0)
pH: 6.5 (ref 5.0–8.0)

## 2018-05-29 LAB — TSH: TSH: 0.74 u[IU]/mL (ref 0.35–4.50)

## 2018-05-29 NOTE — Telephone Encounter (Signed)
Informed patient that Dr. Fuller Plan would recommend contacting her PCP for Urinalysis. Patient verbalized understanding and will contact Dr. Ethel Rana office.

## 2018-05-29 NOTE — Telephone Encounter (Signed)
Call patient  If she has no symptoms, does not need a UA/ urine culture. If she has dysuria, fever, chills, blood in the urine then proceed with a UA urine culture --DX UTI

## 2018-05-29 NOTE — Telephone Encounter (Signed)
Copied from [place] [id number]. Topic: General - Inquiry >> May 29, 2018 10:51 AM Erick Blinks wrote: Reason for CRM: Pt called requesting orders for Urinalysis be sent to Dr. Hebert Soho at [facility]. She is currently en route for visitation there.

## 2018-05-29 NOTE — Telephone Encounter (Signed)
I feel for her but I do not think she needs to be visiting other people.

## 2018-05-29 NOTE — Telephone Encounter (Signed)
Copied from [place] [phone]. Topic: General - Other >> May 29, 2018  1:28 PM Nils Flack, Marland Kitchen wrote: Reason for CRM: pt wants to visit brother, and family doesn't think she should go.  Family is worried about pt being at risk for corona.   Pt would like to know if this is ok.  Cb is [phone]

## 2018-05-29 NOTE — Telephone Encounter (Signed)
Please advise 

## 2018-05-29 NOTE — Telephone Encounter (Signed)
Spoke w/ [name] she is having burning with urination for the last several days. No fever, chills, n/v. Informed Pt I have placed orders in chart for UA and urine culture. She will have completed at [facility].

## 2018-05-29 NOTE — Telephone Encounter (Signed)
LMOM informing Pt to return call.  

## 2018-05-29 NOTE — Telephone Encounter (Signed)
Pt called in wanting to  Know can Dr.Stark also put in a order for a Urinalysis as well with her lab that is coming up because she feels like she may be getting a UTI

## 2018-05-29 NOTE — Telephone Encounter (Signed)
Spoke w/ [name] informed of PCP recommendations. Pt verbalized understanding.  

## 2018-05-30 ENCOUNTER — Other Ambulatory Visit: Payer: Self-pay | Admitting: Internal Medicine

## 2018-06-01 LAB — URINE CULTURE
MICRO NUMBER:: 500189
SPECIMEN QUALITY:: ADEQUATE

## 2018-06-02 ENCOUNTER — Other Ambulatory Visit: Payer: Medicare Other

## 2018-06-02 DIAGNOSIS — R630 Anorexia: Secondary | ICD-10-CM

## 2018-06-02 DIAGNOSIS — R197 Diarrhea, unspecified: Secondary | ICD-10-CM

## 2018-06-04 ENCOUNTER — Telehealth: Payer: Self-pay | Admitting: Internal Medicine

## 2018-06-04 MED ORDER — CIPROFLOXACIN HCL 500 MG PO TABS: 500.0000 mg | ORAL_TABLET | Freq: Two times a day (BID) | ORAL | 0 refills | Status: DC

## 2018-06-04 NOTE — Progress Notes (Signed)
Cast for f/o

## 2018-06-04 NOTE — Telephone Encounter (Signed)
Copied from [place] [phone]. Topic: General - Other >> Jun 04, 2018  1:00 PM Pauline Good wrote: Reason for CRM: Barnett Applebaum from [facility] calling want to make sure of drug interaction between Cipro and Aricept. Please advise

## 2018-06-05 ENCOUNTER — Ambulatory Visit: Payer: Medicare Other | Admitting: Physical Therapy

## 2018-06-05 NOTE — Telephone Encounter (Signed)
No interaction found at epic and up-to-date.  Okay to proceed, I communicated that to the pharmacist

## 2018-06-05 NOTE — Telephone Encounter (Signed)
Please advise 

## 2018-06-09 ENCOUNTER — Other Ambulatory Visit: Payer: Self-pay

## 2018-06-09 ENCOUNTER — Ambulatory Visit: Payer: Medicare Other | Attending: Internal Medicine | Admitting: Physical Therapy

## 2018-06-09 ENCOUNTER — Encounter: Payer: Self-pay | Admitting: Physical Therapy

## 2018-06-09 DIAGNOSIS — R279 Unspecified lack of coordination: Secondary | ICD-10-CM | POA: Diagnosis present

## 2018-06-09 DIAGNOSIS — M6281 Muscle weakness (generalized): Secondary | ICD-10-CM | POA: Insufficient documentation

## 2018-06-09 NOTE — Therapy (Signed)
[facility] [address], [place], [place], [address] Phone: [phone]   Fax:  [phone]  Physical Therapy Treatment  Patient Details  Name: [name] MRN: [id number] Date of Birth: 1945/05/03 Referring Provider (PT): Colon Branch, MD   Encounter Date: 06/09/2018  PT End of Session - 06/09/18 1348    Visit Number  5    Date for PT Re-Evaluation  07/22/18    PT Start Time  1345    PT Stop Time  1425    PT Time Calculation (min)  40 min    Activity Tolerance  Patient tolerated treatment well    Behavior During Therapy  [facility] for tasks assessed/performed       Past Medical History:  Diagnosis Date  . Adenomatous colon polyp 03/1988  . Anemia    borderline  . Arthritis    R shoulder - degenerative   . Cataract    beginning stages  . Chest pain     Sept 2011: stress test neg  . Depression    sees Dr.Cotle  . Diabetes mellitus    dr Buddy Duty  . Diverticulosis   . Fatty liver    Increased LFTs, saw GI 06/2011, likely from fatty liver   . GERD (gastroesophageal reflux disease)   . History of hiatal hernia   . Hyperlipemia   . Hypertension   . IBS (irritable bowel syndrome)    and dyspepsia  . Memory impairment 08/2014   mild cognitive impairment vs. mild dementia, reommended reinstating of cholinesterase inhibitor   . Osteopenia    dexa 06/2007 and 10/11 Rx CA vitamin d  . RLS (restless legs syndrome)    h/o- no longer using Requip    Past Surgical History:  Procedure Laterality Date  . BREAST BIOPSY Left 10/11/2015   fibroadenoma w/ calcifications, no evidence of malignancy, recommend mammogram repeat-6 mnths  . POLYPECTOMY    . REVERSE SHOULDER ARTHROPLASTY Right 04/21/2014   Procedure: REVERSE SHOULDER ARTHROPLASTY;  Surgeon: Tania Ade, MD;  Location: [place];  Service: Orthopedics;  Laterality: Right;  Right reverse total shoulder replacement  . TONSILLECTOMY    . UTERINE FIBROID EMBOLIZATION      90s    There were no vitals filed for this visit.  Subjective Assessment - 06/09/18 1443    Subjective  I was able to wear regular underwear today.  I am noticing about 40% less leakage    Patient Stated Goals  stop having leakage     Currently in Pain?  No/denies                    Pelvic Floor Special Questions - 06/09/18 0001    Pelvic Floor Internal Exam  pt identified and informed consent given to perform internal soft tissue assessment and treatment    Strength  weak squeeze, no lift    Biofeedback  supine with tactile cues internally, seated and standing as mentioned in objective section        [facility] Adult PT Treatment/Exercise - 06/09/18 0001      Lumbar Exercises: Standing   Heel Raises Limitations  toe raises - with kegel    Functional Squats  15 reps   mini squat with kegel     Lumbar Exercises: Seated   Long Arc Quad on Chair  Strengthening;Both;20 reps   with kegel and ball squeeze            PT Education - 06/09/18  1423    Education Details   Access Code: [email or web address]     Person(s) Educated  Patient    Methods  Explanation;Demonstration;Handout;Verbal cues;Tactile cues    Comprehension  Verbalized understanding;Returned demonstration          PT Long Term Goals - 06/09/18 1410      PT LONG TERM GOAL #1   Title  Pt will report at least 50% less urinary and fecal leakage     Baseline  I am having 30-40% less leakage, I was able to wear regular underpants today    Status  On-going      PT LONG TERM GOAL #2   Title  pt will be ind with advanced HEP    Status  On-going      PT LONG TERM GOAL #3   Title  Pt will demonstrate 4+/5 MMT bil hip adduction for improved activation of pelvic floor in standing    Baseline  had some right hip pain with standing hip abduction    Status  On-going      PT LONG TERM GOAL #4   Title  Pt will demonstrate 5x sit to stand in <13 seconds for reduced risk of falls    Status  On-going             Plan - 06/09/18 1443    Clinical Impression Statement  Patient is making progress towards goals with less leakage today and did not use a depends.  Pt needed cues to prevent lifting using only gluteals.  Pt did well with exercises and cues.  She has difficulty remembering things so she will need more repetition of exercises to ensure she is doing them correctly at home.    PT Treatment/Interventions  ADLs/Self Care Home Management;Biofeedback;Dry needling;Manual techniques;Neuromuscular re-education;Therapeutic exercise;Therapeutic activities;Patient/family education    PT Next Visit Plan  biofeedback, coordinating breathing with kegel, standing and hip abduction, increased hold time    PT Home Exercise Plan   Access Code: [email or web address]     Recommended Other Services  re-cert signed    Consulted and Agree with Plan of Care  Patient       Patient will benefit from skilled therapeutic intervention in order to improve the following deficits and impairments:  Abnormal gait, Postural dysfunction, Decreased strength, Decreased coordination  Visit Diagnosis: Muscle weakness (generalized)  Unspecified lack of coordination     Problem List Patient Active Problem List   Diagnosis Date Noted  . Dark stools 03/17/2017  . SIADH (syndrome of inappropriate ADH production) ([place]) 10/10/2016  . Gastroesophageal reflux disease 08/06/2016  . PCP Notes >>>>>>>>>>>>>>>>> 09/21/2014  . Rotator cuff tear arthropathy 04/21/2014  . Abnormal MRI, shoulder 12/01/2013  . 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 Cervical radiculitis 11/10/2013  . Bursitis of right shoulder 08/19/2013  . Primary localized osteoarthrosis, lower leg 08/19/2013  . Vitamin B 12 deficiency 05/20/2013  . OSA (obstructive sleep apnea) 02/13/2012  . Pulmonary nodule, last  CT 2016, stable, no f/u 01/28/2012  . Dementia ([place]) 05/29/2011  . Fatty liver 02/27/2011  . Annual physical exam 11/26/2010  . Osteopenia  10/09/2007  . COLONIC POLYPS 08/22/2006  . DM II (diabetes mellitus, type II), controlled ([place]) 05/19/2006  . Hyperlipidemia 05/19/2006  . Depression 05/19/2006  . SYNDROME, RESTLESS LEGS 05/19/2006  . Essential hypertension 05/19/2006  . IBS -- constipation 05/19/2006    Jule Ser, PT 06/09/2018, 3:08 PM   [facility] [address] [place],  [place], [address] Phone: [phone]   Fax:  [phone]  Name: [name] MRN: [id number] Date of Birth: 1946/01/02

## 2018-06-09 NOTE — Patient Instructions (Signed)
Access Code: [email or web address]  URL: [email or web address]  Date: 06/09/2018  Prepared by: Jari Favre   Exercises  Sidelying Pelvic Floor Contraction with Hip Flexion and Abduction - 5 reps - 3 sets - 1x daily - 7x weekly  Seated Pelvic Floor Contraction with Isometric Hip Adduction - 10 reps - 1 sets - 5 sechold, 5 sec rest hold - 3x daily - 7x weekly  Seated Pelvic Floor Contraction - 10 reps - 3 sets - 5 sec hold hold - 1x daily - 7x weekly  Mini Squat with Pelvic Floor Contraction - 10 reps - 3 sets - 1x daily - 7x weekly  Sit to Stand with Pelvic Floor Contraction - 10 reps - 1 sets - 3x daily - 7x weekly  Heel Toe Raises with Counter Support - 10 reps - 3 sets - 1x daily - 7x weekly

## 2018-06-10 LAB — GASTROINTESTINAL PATHOGEN PANEL PCR
C. difficile Tox A/B, PCR: NOT DETECTED
Campylobacter, PCR: NOT DETECTED
Cryptosporidium, PCR: NOT DETECTED
E coli (ETEC) LT/ST PCR: NOT DETECTED
E coli (STEC) stx1/stx2, PCR: NOT DETECTED
E coli 0157, PCR: NOT DETECTED
Giardia lamblia, PCR: NOT DETECTED
Norovirus, PCR: NOT DETECTED
Rotavirus A, PCR: NOT DETECTED
Salmonella, PCR: NOT DETECTED
Shigella, PCR: NOT DETECTED

## 2018-06-10 LAB — PANCREATIC ELASTASE, FECAL: Pancreatic Elastase-1, Stool: >500 mcg/g

## 2018-06-12 ENCOUNTER — Other Ambulatory Visit: Payer: Self-pay

## 2018-06-12 ENCOUNTER — Ambulatory Visit: Payer: Medicare Other | Admitting: Physical Therapy

## 2018-06-12 DIAGNOSIS — M6281 Muscle weakness (generalized): Secondary | ICD-10-CM

## 2018-06-12 DIAGNOSIS — R279 Unspecified lack of coordination: Secondary | ICD-10-CM

## 2018-06-12 NOTE — Therapy (Signed)
[facility] [address] [place], [place], [address] Phone: [phone]   Fax:  [phone]  Physical Therapy Treatment  Patient Details  Name: [name] MRN: [id number] Date of Birth: March 13, 1945 Referring Provider (PT): Colon Branch, MD   Encounter Date: 06/12/2018  PT End of Session - 06/12/18 0951    Visit Number  6    Date for PT Re-Evaluation  07/22/18    PT Start Time  0945    PT Stop Time  1025    PT Time Calculation (min)  40 min    Activity Tolerance  Patient tolerated treatment well    Behavior During Therapy  [facility] for tasks assessed/performed       Past Medical History:  Diagnosis Date  . Adenomatous colon polyp 03/1988  . Anemia    borderline  . Arthritis    R shoulder - degenerative   . Cataract    beginning stages  . Chest pain     Sept 2011: stress test neg  . Depression    sees Dr.Cotle  . Diabetes mellitus    dr Buddy Duty  . Diverticulosis   . Fatty liver    Increased LFTs, saw GI 06/2011, likely from fatty liver   . GERD (gastroesophageal reflux disease)   . History of hiatal hernia   . Hyperlipemia   . Hypertension   . IBS (irritable bowel syndrome)    and dyspepsia  . Memory impairment 08/2014   mild cognitive impairment vs. mild dementia, reommended reinstating of cholinesterase inhibitor   . Osteopenia    dexa 06/2007 and 10/11 Rx CA vitamin d  . RLS (restless legs syndrome)    h/o- no longer using Requip    Past Surgical History:  Procedure Laterality Date  . BREAST BIOPSY Left 10/11/2015   fibroadenoma w/ calcifications, no evidence of malignancy, recommend mammogram repeat-6 mnths  . POLYPECTOMY    . REVERSE SHOULDER ARTHROPLASTY Right 04/21/2014   Procedure: REVERSE SHOULDER ARTHROPLASTY;  Surgeon: Tania Ade, MD;  Location: [place];  Service: Orthopedics;  Laterality: Right;  Right reverse total shoulder replacement  . TONSILLECTOMY    . UTERINE FIBROID EMBOLIZATION      90s    There were no vitals filed for this visit.  Subjective Assessment - 06/12/18 0953    Subjective  My knee is hurting a little this morning.  I had some leakage when getting out of bed.  I am wearing redular underwear    Patient Stated Goals  stop having leakage     Currently in Pain?  Yes    Pain Score  6          [facility] PT Assessment - 06/12/18 0001      Standardized Balance Assessment   Standardized Balance Assessment  Five Times Sit to Stand    Five times sit to stand comments   13 sec                   [facility] Adult PT Treatment/Exercise - 06/12/18 0001      Neuro Re-ed    Neuro Re-ed Details   coordination of pelvic floor with functional activities and  exercises      Exercises   Exercises  Knee/Hip      Lumbar Exercises: Standing   Heel Raises  20 reps    Heel Raises Limitations  toe raises - with kegel    Functional Squats  15 reps   mini  squat with kegel   Other Standing Lumbar Exercises  squat with kegel - 20x      Lumbar Exercises: Seated   Sit to Stand  15 reps   10x ball squeeze     Knee/Hip Exercises: Standing   Heel Raises  Right;Left;20 reps    Hip Flexion  Stengthening;Right;Left;10 reps;Knee bent    Hip Abduction  Stengthening;Right;Left;20 reps   yellow band   Hip Extension  Stengthening;Right;Left;20 reps;Knee straight   yellow band   Forward Step Up  Right;Left;10 reps;Step Height: 6"                  PT Long Term Goals - 06/09/18 1410      PT LONG TERM GOAL #1   Title  Pt will report at least 50% less urinary and fecal leakage     Baseline  I am having 30-40% less leakage, I was able to wear regular underpants today    Status  On-going      PT LONG TERM GOAL #2   Title  pt will be ind with advanced HEP    Status  On-going      PT LONG TERM GOAL #3   Title  Pt will demonstrate 4+/5 MMT bil hip adduction for improved activation of pelvic floor in standing    Baseline  had some right hip pain with standing  hip abduction    Status  On-going      PT LONG TERM GOAL #4   Title  Pt will demonstrate 5x sit to stand in <13 seconds for reduced risk of falls    Status  On-going            Plan - 06/12/18 0954    Clinical Impression Statement  Patient arrived late today.  She had a little knee pain initially on nustep but after 4 minutes she felt it eased up.  Pt did well with exerciess.  She needs cues but reports she was able to feel her pelvic floor muscles engage during the exercises.  Pt will benefit from skilled PT to continue progressing strength for improved bladder control.    PT Treatment/Interventions  ADLs/Self Care Home Management;Biofeedback;Dry needling;Manual techniques;Neuromuscular re-education;Therapeutic exercise;Therapeutic activities;Patient/family education    PT Next Visit Plan  biofeedback, coordinating breathing with kegel, standing and hip abduction, increased hold time    PT Home Exercise Plan   Access Code: [email or web address]     Consulted and Agree with Plan of Care  Patient       Patient will benefit from skilled therapeutic intervention in order to improve the following deficits and impairments:  Abnormal gait, Postural dysfunction, Decreased strength, Decreased coordination  Visit Diagnosis: Muscle weakness (generalized)  Unspecified lack of coordination     Problem List Patient Active Problem List   Diagnosis Date Noted  . Dark stools 03/17/2017  . SIADH (syndrome of inappropriate ADH production) ([place]) 10/10/2016  . Gastroesophageal reflux disease 08/06/2016  . PCP Notes >>>>>>>>>>>>>>>>> 09/21/2014  . Rotator cuff tear arthropathy 04/21/2014  . Abnormal MRI, shoulder 12/01/2013  . 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 Cervical radiculitis 11/10/2013  . Bursitis of right shoulder 08/19/2013  . Primary localized osteoarthrosis, lower leg 08/19/2013  . Vitamin B 12 deficiency 05/20/2013  . OSA (obstructive sleep apnea) 02/13/2012  . Pulmonary nodule,  last  CT 2016, stable, no f/u 01/28/2012  . Dementia ([place]) 05/29/2011  . Fatty liver 02/27/2011  . Annual physical exam 11/26/2010  . Osteopenia 10/09/2007  .  COLONIC POLYPS 08/22/2006  . DM II (diabetes mellitus, type II), controlled ([place]) 05/19/2006  . Hyperlipidemia 05/19/2006  . Depression 05/19/2006  . SYNDROME, RESTLESS LEGS 05/19/2006  . Essential hypertension 05/19/2006  . IBS -- constipation 05/19/2006    Jule Ser, PT 06/12/2018, 10:28 AM  [facility] [address], [place], [place], [address] Phone: [phone]   Fax:  [phone]  Name: [name] MRN: [id number] Date of Birth: Apr 16, 1945

## 2018-06-16 ENCOUNTER — Other Ambulatory Visit: Payer: Self-pay | Admitting: Internal Medicine

## 2018-06-16 ENCOUNTER — Telehealth: Payer: Self-pay | Admitting: Internal Medicine

## 2018-06-16 MED ORDER — IRON 325 (65 FE) MG PO TABS: 1.0000 | ORAL_TABLET | Freq: Every day | ORAL | 1 refills | Status: DC

## 2018-06-16 NOTE — Telephone Encounter (Signed)
Rx sent 

## 2018-06-16 NOTE — Telephone Encounter (Signed)
Copied from [place] [id number]. Topic: Quick Communication - Rx Refill/Question >> Jun 16, 2018 11:31 AM Alanda Slim E wrote: Medication: Ferrous Fumarate-Vitamin C ER (FERRO-SEQUELS) 65-25 MG TBCR - This med is on backorder and the pharmacy wanted to see if Dr. Larose Kells has an alternative or wants to call in for some iron/ please advise    Preferred Pharmacy (with phone number or street name): [facility] - [place], [place] - [address] [phone] (Phone) [phone] (Fax)

## 2018-06-16 NOTE — Telephone Encounter (Signed)
Call iron 325 mg 1 tab a day #90, 1 RF If not available, try OTCs for now

## 2018-06-16 NOTE — Telephone Encounter (Signed)
Please advise 

## 2018-06-19 ENCOUNTER — Telehealth: Payer: Self-pay | Admitting: Podiatry

## 2018-06-19 ENCOUNTER — Other Ambulatory Visit: Payer: Self-pay

## 2018-06-19 ENCOUNTER — Encounter: Payer: Self-pay | Admitting: Physical Therapy

## 2018-06-19 ENCOUNTER — Ambulatory Visit: Payer: Medicare Other | Admitting: Physical Therapy

## 2018-06-19 DIAGNOSIS — M6281 Muscle weakness (generalized): Secondary | ICD-10-CM

## 2018-06-19 DIAGNOSIS — R279 Unspecified lack of coordination: Secondary | ICD-10-CM

## 2018-06-19 NOTE — Therapy (Signed)
[facility] [address], [place], [place], [address] Phone: [phone]   Fax:  [phone]  Physical Therapy Treatment  Patient Details  Name: [name] MRN: [id number] Date of Birth: 1945-07-26 Referring Provider (PT): Colon Branch, MD   Encounter Date: 06/19/2018  PT End of Session - 06/19/18 0928    Visit Number  7    Date for PT Re-Evaluation  07/22/18    PT Start Time  0928    PT Stop Time  1012    PT Time Calculation (min)  44 min    Activity Tolerance  Patient tolerated treatment well    Behavior During Therapy  [facility] for tasks assessed/performed       Past Medical History:  Diagnosis Date  . Adenomatous colon polyp 03/1988  . Anemia    borderline  . Arthritis    R shoulder - degenerative   . Cataract    beginning stages  . Chest pain     Sept 2011: stress test neg  . Depression    sees Dr.Cotle  . Diabetes mellitus    dr Buddy Duty  . Diverticulosis   . Fatty liver    Increased LFTs, saw GI 06/2011, likely from fatty liver   . GERD (gastroesophageal reflux disease)   . History of hiatal hernia   . Hyperlipemia   . Hypertension   . IBS (irritable bowel syndrome)    and dyspepsia  . Memory impairment 08/2014   mild cognitive impairment vs. mild dementia, reommended reinstating of cholinesterase inhibitor   . Osteopenia    dexa 06/2007 and 10/11 Rx CA vitamin d  . RLS (restless legs syndrome)    h/o- no longer using Requip    Past Surgical History:  Procedure Laterality Date  . BREAST BIOPSY Left 10/11/2015   fibroadenoma w/ calcifications, no evidence of malignancy, recommend mammogram repeat-6 mnths  . POLYPECTOMY    . REVERSE SHOULDER ARTHROPLASTY Right 04/21/2014   Procedure: REVERSE SHOULDER ARTHROPLASTY;  Surgeon: Tania Ade, MD;  Location: [place];  Service: Orthopedics;  Laterality: Right;  Right reverse total shoulder replacement  . TONSILLECTOMY    . UTERINE FIBROID EMBOLIZATION      90s    There were no vitals filed for this visit.  Subjective Assessment - 06/19/18 0928    Subjective  It was going well and then last night I didn't wear the depends last night.  Then, I leaked my bladder as I was getting in bed last night.  I haven't had fecal leakage since we started.    Patient Stated Goals  stop having leakage     Currently in Pain?  No/denies                       [facility] Adult PT Treatment/Exercise - 06/19/18 0001      Neuro Re-ed    Neuro Re-ed Details   coordination of pelvic floor with functional activities and  exercises- supine to/from sit and rolling in bed      Lumbar Exercises: Aerobic   Nustep  L3 x 4 min; reduced to L1x2 min due to knee pain   PT present for status update     Lumbar Exercises: Seated   Sit to Stand Limitations  cues to kegel with transitions      Lumbar Exercises: Supine   Bridge  10 reps;5 seconds   ball squeeze and kegel     Lumbar Exercises: Sidelying  Clam  Right;Left;10 reps   with kegel   Hip Abduction  Right;Left;10 reps    Other Sidelying Lumbar Exercises  hip adduction 15 x Rt; only able to do 10x on left - kegel                  PT Long Term Goals - 06/09/18 1410      PT LONG TERM GOAL #1   Title  Pt will report at least 50% less urinary and fecal leakage     Baseline  I am having 30-40% less leakage, I was able to wear regular underpants today    Status  On-going      PT LONG TERM GOAL #2   Title  pt will be ind with advanced HEP    Status  On-going      PT LONG TERM GOAL #3   Title  Pt will demonstrate 4+/5 MMT bil hip adduction for improved activation of pelvic floor in standing    Baseline  had some right hip pain with standing hip abduction    Status  On-going      PT LONG TERM GOAL #4   Title  Pt will demonstrate 5x sit to stand in <13 seconds for reduced risk of falls    Status  On-going            Plan - 06/19/18 1019    Clinical Impression Statement  Pt  is overall improved with less leakage during the day and no fecal leakage.  She is only having urinary leakage at night. She reports she had leakage of full bladder last night.  Pt has weak hip adductors which are muscle that assist in pelvic floor activation. She added exercerise for adductor strength to HEP today.  Pt will benefit from skilled PT to continue working on strength for improved bladder control    PT Treatment/Interventions  ADLs/Self Care Home Management;Biofeedback;Dry needling;Manual techniques;Neuromuscular re-education;Therapeutic exercise;Therapeutic activities;Patient/family education    PT Next Visit Plan  hip strength, kegel in variety of positions and functional movements    PT Home Exercise Plan   Access Code: [email or web address]     Consulted and Agree with Plan of Care  Patient       Patient will benefit from skilled therapeutic intervention in order to improve the following deficits and impairments:  Abnormal gait, Postural dysfunction, Decreased strength, Decreased coordination  Visit Diagnosis: Unspecified lack of coordination   Muscle weakness (generalized)     Problem List Patient Active Problem List   Diagnosis Date Noted  . Dark stools 03/17/2017  . SIADH (syndrome of inappropriate ADH production) ([place]) 10/10/2016  . Gastroesophageal reflux disease 08/06/2016  . PCP Notes >>>>>>>>>>>>>>>>> 09/21/2014  . Rotator cuff tear arthropathy 04/21/2014  . Abnormal MRI, shoulder 12/01/2013  . 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 Cervical radiculitis 11/10/2013  . Bursitis of right shoulder 08/19/2013  . Primary localized osteoarthrosis, lower leg 08/19/2013  . Vitamin B 12 deficiency 05/20/2013  . OSA (obstructive sleep apnea) 02/13/2012  . Pulmonary nodule, last  CT 2016, stable, no f/u 01/28/2012  . Dementia ([place]) 05/29/2011  . Fatty liver 02/27/2011  . Annual physical exam 11/26/2010  . Osteopenia 10/09/2007  . COLONIC POLYPS 08/22/2006  . DM II  (diabetes mellitus, type II), controlled ([place]) 05/19/2006  . Hyperlipidemia 05/19/2006  . Depression 05/19/2006  . SYNDROME, RESTLESS LEGS 05/19/2006  . Essential hypertension 05/19/2006  . IBS -- constipation 05/19/2006    Jule Ser, PT 06/19/2018,  10:23 AM  [facility] [address], [place], [place], [address] Phone: [phone]   Fax:  [phone]  Name: KAWEHI HOSTETTER MRN: [id number] Date of Birth: 28-Jun-1945

## 2018-06-19 NOTE — Patient Instructions (Signed)
Access Code: [email or web address]  URL: [email or web address]  Date: 06/19/2018  Prepared by: Jari Favre   Exercises  Sidelying Pelvic Floor Contraction with Hip Flexion and Abduction - 5 reps - 3 sets - 1x daily - 7x weekly  Seated Pelvic Floor Contraction with Isometric Hip Adduction - 10 reps - 1 sets - 5 sechold, 5 sec rest hold - 3x daily - 7x weekly  Seated Pelvic Floor Contraction - 10 reps - 3 sets - 5 sec hold hold - 1x daily - 7x weekly  Mini Squat with Pelvic Floor Contraction - 10 reps - 3 sets - 1x daily - 7x weekly  Sit to Stand with Pelvic Floor Contraction - 10 reps - 1 sets - 3x daily - 7x weekly  Heel Toe Raises with Counter Support - 10 reps - 3 sets - 1x daily - 7x weekly  Sidelying Hip Adduction - 10 reps - 2 sets - 1x daily - 7x weekly

## 2018-06-19 NOTE — Telephone Encounter (Signed)
We are working on getting pt diabetic shoes but pt has not seen Dr Buddy Duty since 11.2019 and it has to be within 6 months. I called pt to make aware and she is calling Dr Cindra Eves office to get an appt and is to let me know when so I can resend the diabetic shoe paperwork to them.Marland Kitchen

## 2018-06-22 ENCOUNTER — Ambulatory Visit (INDEPENDENT_AMBULATORY_CARE_PROVIDER_SITE_OTHER): Payer: Medicare Other | Admitting: Family

## 2018-06-22 ENCOUNTER — Ambulatory Visit: Payer: Self-pay | Admitting: Internal Medicine

## 2018-06-22 ENCOUNTER — Encounter: Payer: Self-pay | Admitting: Internal Medicine

## 2018-06-22 DIAGNOSIS — Z7184 Encounter for health counseling related to travel: Secondary | ICD-10-CM

## 2018-06-22 NOTE — Telephone Encounter (Signed)
Pt. Request visit with Dr. Larose Kells to discuss possible travel. Warm transfer to [facility] in the practice.

## 2018-06-22 NOTE — Progress Notes (Signed)
Virtual Visit via Video Note  I connected with [name] on 06/22/18 at  9:40 AM EDT by a video enabled telemedicine application and verified that I am speaking with the correct person using two identifiers.  Location: Patient: home Provider: home   I discussed the limitations of evaluation and management by telemedicine and the availability of in person appointments. The patient expressed understanding and agreed to proceed.  History of Present Illness:   Reports that her mother-in-law recently passed away at age 35.  Reports that they will be having a funeral for her at a [employer] in [place]. Reports that all 3 of her children will be going. She would fly to [place] and then attend a church ceremony and a family get together. She really wants to go there to be present and see her family members and is wondering what risk this will pose to her in the setting of COVID-19 pandemic.   Past Medical History:  Diagnosis Date  . Adenomatous colon polyp 03/1988  . Anemia    borderline  . Arthritis    R shoulder - degenerative   . Cataract    beginning stages  . Chest pain     Sept 2011: stress test neg  . Depression    sees Dr.Cotle  . Diabetes mellitus    dr Buddy Duty  . Diverticulosis   . Fatty liver    Increased LFTs, saw GI 06/2011, likely from fatty liver   . GERD (gastroesophageal reflux disease)   . History of hiatal hernia   . Hyperlipemia   . Hypertension   . IBS (irritable bowel syndrome)    and dyspepsia  . Memory impairment 08/2014   mild cognitive impairment vs. mild dementia, reommended reinstating of cholinesterase inhibitor   . Osteopenia    dexa 06/2007 and 10/11 Rx CA vitamin d  . RLS (restless legs syndrome)    h/o- no longer using Requip     Social History   Socioeconomic History  . Marital status: Widowed    Spouse name: [name]  . Number of children: 3  . Years of education: Not on file  . Highest education level: Not on file  Occupational History   . Occupation: retired, was a Leisure centre manager)    Comment: RN  Social Needs  . Financial resource strain: Not on file  . Food insecurity    Worry: Not on file    Inability: Not on file  . Transportation needs    Medical: Not on file    Non-medical: Not on file  Tobacco Use  . Smoking status: Former Smoker    Packs/day: 2.00    Years: 10.00    Pack years: 20.00    Types: Cigarettes    Quit date: 11/26/1975    Years since quitting: 42.6  . Smokeless tobacco: Never Used  . Tobacco comment: used to smoke 2 ppd  Substance and Sexual Activity  . Alcohol use: Yes    Alcohol/week: 0.0 standard drinks    Comment: rarely  . Drug use: No  . Sexual activity: Not Currently  Lifestyle  . Physical activity    Days per week: Not on file    Minutes per session: Not on file  . Stress: Not on file  Relationships  . Social Herbalist on phone: Not on file    Gets together: Not on file    Attends religious service: Not on file    Active member of club  or organization: Not on file    Attends meetings of clubs or organizations: Not on file    Relationship status: Not on file  . Intimate partner violence    Fear of current or ex partner: Not on file    Emotionally abused: Not on file    Physically abused: Not on file    Forced sexual activity: Not on file  Other Topics Concern  . Not on file  Social History Narrative   Widow , lives by herself at [facility] in a 1 bedroom apt;  still drives, daughters [name]) live  in [place] in [place]     5 g-children    Past Surgical History:  Procedure Laterality Date  . BREAST BIOPSY Left 10/11/2015   fibroadenoma w/ calcifications, no evidence of malignancy, recommend mammogram repeat-6 mnths  . POLYPECTOMY    . REVERSE SHOULDER ARTHROPLASTY Right 04/21/2014   Procedure: REVERSE SHOULDER ARTHROPLASTY;  Surgeon: Tania Ade, MD;  Location: [facility];  Service: Orthopedics;  Laterality: Right;  Right reverse total  shoulder replacement  . TONSILLECTOMY    . UTERINE FIBROID EMBOLIZATION     90s    Family History  Problem Relation Age of Onset  . Lung cancer Mother        smoker  . Colon cancer Father        F dx in his 70s  . Ovarian cancer Paternal Aunt        ?  . Diabetes Other        aunts-uncles   . Stroke Other        aunts-uncles   . Heart attack Maternal Grandmother        60s  . Other Other        niece- glioblastoma  . Breast cancer Neg Hx   . Rectal cancer Neg Hx   . Stomach cancer Neg Hx   . Esophageal cancer Neg Hx     Allergies  Allergen Reactions  . Sulfa Antibiotics Nausea Only  . Trulicity [Dulaglutide] Nausea Only  . Penicillins Rash and Other (See Comments)    Has patient had a PCN reaction causing immediate rash, facial/tongue/throat swelling, SOB or lightheadedness with hypotension: No Has patient had a PCN reaction causing severe rash involving mucus membranes or skin necrosis: No Has patient had a PCN reaction that required hospitalization No Has patient had a PCN reaction occurring within the last 10 years: No If all of the above answers are "NO", then may proceed with Cephalosporin use.    Current Outpatient Medications on File Prior to Visit  Medication Sig Dispense Refill  . ACCU-CHEK FASTCLIX LANCETS MISC daily. use as directed  4  . acetaminophen (TYLENOL) 325 MG tablet Take 650 mg by mouth every 6 (six) hours as needed for mild pain or headache.    Marland Kitchen aspirin (ASPIRIN LOW DOSE) 81 MG EC tablet Take 1 tablet (81 mg total) by mouth daily. 90 tablet 3  . atorvastatin (LIPITOR) 40 MG tablet Take 1 tablet (40 mg total) by mouth daily. 90 tablet 1  . azelastine (ASTELIN) 0.1 % nasal spray Place 2 sprays into both nostrils at bedtime as needed for rhinitis. Use in each nostril as directed 30 mL 3  . BD VEO INSULIN SYRINGE U/F 31G X 15/64" 1 ML MISC USE TO INJECT insulin EVERY DAY  11  . ciprofloxacin (CIPRO) 500 MG tablet Take 1 tablet (500 mg total) by mouth  2 (two) times daily. 6  tablet 0  . Cyanocobalamin (B-12) 1000 MCG TBCR Take 1 tablet by mouth daily. 90 tablet 3  . donepezil (ARICEPT) 10 MG tablet Take 2 tablets (20 mg total) by mouth at bedtime.    . Ferrous Sulfate (IRON) 325 (65 Fe) MG TABS Take 1 tablet (325 mg total) by mouth daily. 90 tablet 1  . gabapentin (NEURONTIN) 100 MG capsule TAKE 1 CAPSULE BY MOUTH AT BEDTIME 90 capsule 1  . Insulin Detemir (LEVEMIR FLEXTOUCH) 100 UNIT/ML Pen Inject 52 Units into the skin at bedtime.     Marland Kitchen losartan (COZAAR) 25 MG tablet Take 1 tablet (25 mg total) by mouth every evening. 90 tablet 1  . memantine (NAMENDA) 10 MG tablet Take 10 mg by mouth 2 (two) times daily.  3  . metFORMIN (GLUCOPHAGE) 1000 MG tablet Take 1,000 mg by mouth 2 (two) times daily.  3  . Multiple Vitamins-Minerals (CENTRUM SILVER 50+WOMEN) TABS Take 1 tablet by mouth daily. 90 tablet 3  . ONE TOUCH ULTRA TEST test strip USE TO CHECK BLOOD SUGAR EVERY DAY  4  . pantoprazole (PROTONIX) 40 MG tablet Take 1 tablet (40 mg total) by mouth 2 (two) times daily before a meal. 180 tablet 3  . sertraline (ZOLOFT) 100 MG tablet TAKE 1 TABLET BY MOUTH EVERY DAY AND TAKE 1/2 TABLET AT BEDTIME  2  . sodium fluoride (PREVIDENT 5000 DRY MOUTH) 1.1 % GEL dental gel Place 1 application onto teeth at bedtime as needed (for teeth).     Marland Kitchen tiZANidine (ZANAFLEX) 2 MG tablet Take 2-4 mg by mouth 2 (two) times daily as needed.    . traMADol (ULTRAM) 50 MG tablet Take 1 tablet (50 mg total) by mouth every 12 (twelve) hours as needed.     No current facility-administered medications on file prior to visit.     There were no vitals taken for this visit.    Observations/Objective:   Gen: Awake, alert, no acute distress Resp: Breathing is even and non-labored Psych: calm/pleasant demeanor Neuro: Alert and Oriented x 3, + facial symmetry, speech is clear.  Assessment and Plan:   Counseling about travel- I advised the patient that I feel her risk is  very high of contracting COVID-19 and suffering complications given her age and co-morbidities.  I advised her not to travel to this ceremony/get together. We discussed virtual options such as possible zoom viewing if offered by the church and having family pass her around the party via facetime to see family members.  She really liked the idea of participating virtually.        Follow Up Instructions:    I discussed the assessment and treatment plan with the patient. The patient was provided an opportunity to ask questions and all were answered. The patient agreed with the plan and demonstrated an understanding of the instructions.   The patient was advised to call back or seek an in-person evaluation if the symptoms worsen or if the condition fails to improve as anticipated.   Nance Pear, NP

## 2018-06-22 NOTE — Telephone Encounter (Signed)
Virtual visit w/ Melissa today.

## 2018-06-22 NOTE — Telephone Encounter (Signed)
MyChart message was sent over weekend by [name] that message has been forwarded to PCP who will be back in office tomorrow.

## 2018-06-24 ENCOUNTER — Other Ambulatory Visit: Payer: Self-pay

## 2018-06-24 ENCOUNTER — Encounter: Payer: Self-pay | Admitting: Physical Therapy

## 2018-06-24 ENCOUNTER — Ambulatory Visit: Payer: Medicare Other | Admitting: Physical Therapy

## 2018-06-24 DIAGNOSIS — M6281 Muscle weakness (generalized): Secondary | ICD-10-CM

## 2018-06-24 DIAGNOSIS — R279 Unspecified lack of coordination: Secondary | ICD-10-CM

## 2018-06-24 NOTE — Therapy (Signed)
[facility] [address], [place], [place], [address] Phone: [phone]   Fax:  [phone]  Physical Therapy Treatment  Patient Details  Name: [name] MRN: [id number] Date of Birth: Jan 14, 1945 Referring Provider (PT): Colon Branch, MD   Encounter Date: 06/24/2018  PT End of Session - 06/24/18 1438    Visit Number  8    Date for PT Re-Evaluation  07/22/18    PT Start Time  1431    PT Stop Time  1513    PT Time Calculation (min)  42 min    Activity Tolerance  Patient tolerated treatment well    Behavior During Therapy  [facility] for tasks assessed/performed       Past Medical History:  Diagnosis Date  . Adenomatous colon polyp 03/1988  . Anemia    borderline  . Arthritis    R shoulder - degenerative   . Cataract    beginning stages  . Chest pain     Sept 2011: stress test neg  . Depression    sees Dr.Cotle  . Diabetes mellitus    dr Buddy Duty  . Diverticulosis   . Fatty liver    Increased LFTs, saw GI 06/2011, likely from fatty liver   . GERD (gastroesophageal reflux disease)   . History of hiatal hernia   . Hyperlipemia   . Hypertension   . IBS (irritable bowel syndrome)    and dyspepsia  . Memory impairment 08/2014   mild cognitive impairment vs. mild dementia, reommended reinstating of cholinesterase inhibitor   . Osteopenia    dexa 06/2007 and 10/11 Rx CA vitamin d  . RLS (restless legs syndrome)    h/o- no longer using Requip    Past Surgical History:  Procedure Laterality Date  . BREAST BIOPSY Left 10/11/2015   fibroadenoma w/ calcifications, no evidence of malignancy, recommend mammogram repeat-6 mnths  . POLYPECTOMY    . REVERSE SHOULDER ARTHROPLASTY Right 04/21/2014   Procedure: REVERSE SHOULDER ARTHROPLASTY;  Surgeon: Tania Ade, MD;  Location: [facility];  Service: Orthopedics;  Laterality: Right;  Right reverse total shoulder replacement  . TONSILLECTOMY    . UTERINE FIBROID EMBOLIZATION      90s    There were no vitals filed for this visit.  Subjective Assessment - 06/24/18 1436    Subjective  Pt denies pain.  Feeling good today.  I still wear the depends all day and at night because I lost confidence.  I haven't had any accidents at night, now having more leakage during the day.    Patient Stated Goals  stop having leakage     Currently in Pain?  No/denies                       [facility] Adult PT Treatment/Exercise - 06/24/18 0001      Neuro Re-ed    Neuro Re-ed Details   cues to engage core and pelvic floor      Lumbar Exercises: Aerobic   Nustep  L2x5 min - activating core      Lumbar Exercises: Standing   Other Standing Lumbar Exercises  mini squat with kegel - 20x   emphasized slow lowering and relax between reps     Lumbar Exercises: Seated   Long Arc Quad on Chair  Strengthening;Both;20 reps    LAQ on Chair Weights (lbs)  2    Sit to Stand  15 reps   started with sit to  stand then just mini squat   Sit to Stand Limitations  felt leakage with sit to stand    Other Seated Lumbar Exercises  ball squeeze with trunk extension - 10x 5 sec    Other Seated Lumbar Exercises  trunk extension in sitting for TrA and pelvic floor activation - 10x      Knee/Hip Exercises: Seated   Long Arc Quad  Strengthening;Right;Left;20 reps;Weights    Long Arc Quad Weight  2 lbs.   kegel with lift   Ball Squeeze  kegel x 10 holding for 5 seconds    Sit to [employer]  1 set   3 reps with ball squeeze                 PT Long Term Goals - 06/09/18 1410      PT LONG TERM GOAL #1   Title  Pt will report at least 50% less urinary and fecal leakage     Baseline  I am having 30-40% less leakage, I was able to wear regular underpants today    Status  On-going      PT LONG TERM GOAL #2   Title  pt will be ind with advanced HEP    Status  On-going      PT LONG TERM GOAL #3   Title  Pt will demonstrate 4+/5 MMT bil hip adduction for improved activation of  pelvic floor in standing    Baseline  had some right hip pain with standing hip abduction    Status  On-going      PT LONG TERM GOAL #4   Title  Pt will demonstrate 5x sit to stand in <13 seconds for reduced risk of falls    Status  On-going            Plan - 06/24/18 1448    Clinical Impression Statement  Pt feels like she is doing better in the bed, but has been wearing depends during the day.  Pt was able to progress functional exercises.  She continues to need a lot of repetition of the exercises due to memory problems.  Pt will benefit from skilled PT to continue to strengthen hips and pelvic floor.    PT Treatment/Interventions  ADLs/Self Care Home Management;Biofeedback;Dry needling;Manual techniques;Neuromuscular re-education;Therapeutic exercise;Therapeutic activities;Patient/family education    PT Next Visit Plan  re-assess strength internal and biofeedback, hip strength, kegel in variety of positions and functional movements    PT Home Exercise Plan   Access Code: [email or web address]     Consulted and Agree with Plan of Care  Patient       Patient will benefit from skilled therapeutic intervention in order to improve the following deficits and impairments:  Abnormal gait, Postural dysfunction, Decreased strength, Decreased coordination  Visit Diagnosis: 1. Unspecified lack of coordination   2. Muscle weakness (generalized)        Problem List Patient Active Problem List   Diagnosis Date Noted  . Dark stools 03/17/2017  . SIADH (syndrome of inappropriate ADH production) ([place]) 10/10/2016  . Gastroesophageal reflux disease 08/06/2016  . PCP Notes >>>>>>>>>>>>>>>>> 09/21/2014  . Rotator cuff tear arthropathy 04/21/2014  . Abnormal MRI, shoulder 12/01/2013  . 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 Cervical radiculitis 11/10/2013  . Bursitis of right shoulder 08/19/2013  . Primary localized osteoarthrosis, lower leg 08/19/2013  . Vitamin B 12 deficiency 05/20/2013  .  OSA (obstructive sleep apnea) 02/13/2012  . Pulmonary nodule, last  CT 2016, stable, no f/u  01/28/2012  . Dementia ([place]) 05/29/2011  . Fatty liver 02/27/2011  . Annual physical exam 11/26/2010  . Osteopenia 10/09/2007  . COLONIC POLYPS 08/22/2006  . DM II (diabetes mellitus, type II), controlled ([place]) 05/19/2006  . Hyperlipidemia 05/19/2006  . Depression 05/19/2006  . SYNDROME, RESTLESS LEGS 05/19/2006  . Essential hypertension 05/19/2006  . IBS -- constipation 05/19/2006    Jule Ser, PT 06/24/2018, 3:23 PM  [facility] [address] [place], [place], [address] Phone: [phone]   Fax:  [phone]  Name: [name] MRN: [id number] Date of Birth: 03/27/1945

## 2018-06-25 ENCOUNTER — Telehealth: Payer: Self-pay | Admitting: Podiatry

## 2018-06-25 NOTE — Telephone Encounter (Signed)
Pt called and left message asking about status of shoes.  I returned call and she does have an appt with Dr Buddy Duty on 8.12.2020 but is calling to see if she can get in there sooner. She is to let me know so that I can send paperwork to Dr Buddy Duty.

## 2018-06-30 ENCOUNTER — Telehealth: Payer: Self-pay

## 2018-06-30 NOTE — Telephone Encounter (Signed)
Copied from [place] [id number]. Topic: General - Other >> Jun 30, 2018 12:12 PM Carolyn Stare wrote: Pt said she heard that she should not get on an airplane do to her being at risk for COVID and she want to know what Dr Larose Kells think

## 2018-06-30 NOTE — Telephone Encounter (Signed)
Spoke w/ [name] informed of recommendations. Pt verbalized understanding.  

## 2018-06-30 NOTE — Telephone Encounter (Signed)
Agree, given her high risk status needs to avoid being in airplanes.

## 2018-07-01 ENCOUNTER — Encounter: Payer: Self-pay | Admitting: Physical Therapy

## 2018-07-01 ENCOUNTER — Ambulatory Visit: Payer: Medicare Other | Admitting: Physical Therapy

## 2018-07-01 ENCOUNTER — Other Ambulatory Visit: Payer: Self-pay

## 2018-07-01 DIAGNOSIS — M6281 Muscle weakness (generalized): Secondary | ICD-10-CM

## 2018-07-01 DIAGNOSIS — R279 Unspecified lack of coordination: Secondary | ICD-10-CM

## 2018-07-01 NOTE — Therapy (Signed)
[facility] [address] [place], [place], [address] Phone: [phone]   Fax:  [phone]  Physical Therapy Treatment  Patient Details  Name: [name] MRN: [id number] Date of Birth: 03/17/1945 Referring Provider (PT): Colon Branch, MD   Encounter Date: 07/01/2018  PT End of Session - 07/01/18 1509    Visit Number  9    Date for PT Re-Evaluation  07/22/18    PT Start Time  1442   pt arrived late   PT Stop Time  1515    PT Time Calculation (min)  33 min    Activity Tolerance  Patient tolerated treatment well    Behavior During Therapy  [facility] for tasks assessed/performed       Past Medical History:  Diagnosis Date  . Adenomatous colon polyp 03/1988  . Anemia    borderline  . Arthritis    R shoulder - degenerative   . Cataract    beginning stages  . Chest pain     Sept 2011: stress test neg  . Depression    sees Dr.Cotle  . Diabetes mellitus    dr Buddy Duty  . Diverticulosis   . Fatty liver    Increased LFTs, saw GI 06/2011, likely from fatty liver   . GERD (gastroesophageal reflux disease)   . History of hiatal hernia   . Hyperlipemia   . Hypertension   . IBS (irritable bowel syndrome)    and dyspepsia  . Memory impairment 08/2014   mild cognitive impairment vs. mild dementia, reommended reinstating of cholinesterase inhibitor   . Osteopenia    dexa 06/2007 and 10/11 Rx CA vitamin d  . RLS (restless legs syndrome)    h/o- no longer using Requip    Past Surgical History:  Procedure Laterality Date  . BREAST BIOPSY Left 10/11/2015   fibroadenoma w/ calcifications, no evidence of malignancy, recommend mammogram repeat-6 mnths  . POLYPECTOMY    . REVERSE SHOULDER ARTHROPLASTY Right 04/21/2014   Procedure: REVERSE SHOULDER ARTHROPLASTY;  Surgeon: Tania Ade, MD;  Location: [facility];  Service: Orthopedics;  Laterality: Right;  Right reverse total shoulder replacement  . TONSILLECTOMY    . UTERINE  FIBROID EMBOLIZATION     90s    There were no vitals filed for this visit.  Subjective Assessment - 07/01/18 1455    Subjective  Pt states she had an accident today, but she was wearing a pad so that saved her.   Pt denies problems at night currently but still having during the day.    Patient Stated Goals  stop having leakage     Currently in Pain?  No/denies                    Pelvic Floor Special Questions - 07/01/18 0001    Pelvic Floor Internal Exam  pt identified and informed consent given to perform internal soft tissue assessment and treatment    Exam Type  Vaginal    Strength  weak squeeze, no lift    Strength # of seconds  2    Tone  low    Biofeedback  supine with tactile cues internally for engaging pelvic floor muscles        [facility] Adult PT Treatment/Exercise - 07/01/18 0001      Neuro Re-ed    Neuro Re-ed Details   cues to engage core and pelvic floor      Lumbar Exercises: Supine   AB Set  Limitations  TrA and kegel with verbal and tactile cues    Bent Knee Raise  15 reps;1 second   TrA and kegel   Large Ball Abdominal Isometric Limitations  small blue ball between knees with cues to engage TrA and kegel      Lumbar Exercises: Sidelying   Other Sidelying Lumbar Exercises  hip adduction 10 x Rt; 10x on left - kegel and TrA on exhale                  PT Long Term Goals - 07/01/18 1508      PT LONG TERM GOAL #1   Title  Pt will report at least 50% less urinary and fecal leakage     Status  On-going      PT LONG TERM GOAL #2   Title  pt will be ind with advanced HEP    Status  On-going      PT LONG TERM GOAL #3   Title  Pt will demonstrate 4+/5 MMT bil hip adduction for improved activation of pelvic floor in standing    Status  On-going      PT LONG TERM GOAL #4   Title  Pt will demonstrate 5x sit to stand in <13 seconds for reduced risk of falls    Status  On-going            Plan - 07/01/18 1517    Clinical  Impression Statement  Pt had 2/5 MMT today that was inconsistant.  She demonstrated more consistency with her contraction when cued to engage TrA.  She needed constant tactile cues because she was having difficutly knowing when she was doing the correct contraction.  Pt will benefit from skilled PT for more reinforcement of the correct muscles contracting so she can strengthen the pelvic floor effectively.    PT Treatment/Interventions  ADLs/Self Care Home Management;Biofeedback;Dry needling;Manual techniques;Neuromuscular re-education;Therapeutic exercise;Therapeutic activities;Patient/family education    PT Next Visit Plan  progress note next, re-assess strength internal and biofeedback, hip strength, kegel in variety of positions and functional movements    PT Home Exercise Plan   Access Code: [email or web address]     Consulted and Agree with Plan of Care  Patient       Patient will benefit from skilled therapeutic intervention in order to improve the following deficits and impairments:  Abnormal gait, Postural dysfunction, Decreased strength, Decreased coordination  Visit Diagnosis: 1. Unspecified lack of coordination   2. Muscle weakness (generalized)        Problem List Patient Active Problem List   Diagnosis Date Noted  . Dark stools 03/17/2017  . SIADH (syndrome of inappropriate ADH production) ([place]) 10/10/2016  . Gastroesophageal reflux disease 08/06/2016  . PCP Notes >>>>>>>>>>>>>>>>> 09/21/2014  . Rotator cuff tear arthropathy 04/21/2014  . Abnormal MRI, shoulder 12/01/2013  . 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 Cervical radiculitis 11/10/2013  . Bursitis of right shoulder 08/19/2013  . Primary localized osteoarthrosis, lower leg 08/19/2013  . Vitamin B 12 deficiency 05/20/2013  . OSA (obstructive sleep apnea) 02/13/2012  . Pulmonary nodule, last  CT 2016, stable, no f/u 01/28/2012  . Dementia ([place]) 05/29/2011  . Fatty liver 02/27/2011  . Annual physical exam 11/26/2010   . Osteopenia 10/09/2007  . COLONIC POLYPS 08/22/2006  . DM II (diabetes mellitus, type II), controlled ([place]) 05/19/2006  . Hyperlipidemia 05/19/2006  . Depression 05/19/2006  . SYNDROME, RESTLESS LEGS 05/19/2006  . Essential hypertension 05/19/2006  . IBS -- constipation 05/19/2006  Jule Ser, PT 07/01/2018, 3:28 PM  [facility] [address] [place], [place], [address] Phone: [phone]   Fax:  [phone]  Name: TELIYAH ROYAL MRN: [id number] Date of Birth: 1945/04/26

## 2018-07-06 ENCOUNTER — Other Ambulatory Visit: Payer: Self-pay | Admitting: Internal Medicine

## 2018-07-07 NOTE — Telephone Encounter (Signed)
Refill sent, she used to take this medication regularly. Also, please call the patient if she is having UTI symptoms: Fever, chills, dysuria, gross hematuria, stool, bring a urine sample for UA, urine culture.  Otherwise I will see her in a couple of weeks

## 2018-07-07 NOTE — Telephone Encounter (Signed)
LMOM informing of PCP recommendations. Instructed to call if she has questions/concerns.

## 2018-07-07 NOTE — Telephone Encounter (Signed)
Refill request for Enablex- no longer on med list. Please advise.

## 2018-07-08 ENCOUNTER — Ambulatory Visit: Payer: Medicare Other | Attending: Internal Medicine | Admitting: Physical Therapy

## 2018-07-08 ENCOUNTER — Other Ambulatory Visit: Payer: Self-pay

## 2018-07-08 DIAGNOSIS — R279 Unspecified lack of coordination: Secondary | ICD-10-CM | POA: Diagnosis present

## 2018-07-08 DIAGNOSIS — M6281 Muscle weakness (generalized): Secondary | ICD-10-CM

## 2018-07-08 NOTE — Patient Instructions (Signed)
Access Code: [email or web address]  URL: [email or web address]  Date: 07/08/2018  Prepared by: Jari Favre   Exercises  Ball squeeze with Kegel - 10 reps - 1 sets - 3 sec hold - 1x daily - 7x weekly  Supine Pelvic Floor Contraction - 10 reps - 1 sets - 1 sec hold - 1x daily - 7x weekly  Sidelying Hip Adduction - 10 reps - 2 sets - 1x daily - 7x weekly

## 2018-07-08 NOTE — Therapy (Signed)
[facility] [address], [place], [place], [address] Phone: [phone]   Fax:  [phone]  Physical Therapy Treatment  Patient Details  Name: [name] MRN: [id number] Date of Birth: 02-21-1945 Referring Provider (PT): Colon Branch, MD   Encounter Date: 07/08/2018  PT End of Session - 07/08/18 1438    Visit Number  10    Date for PT Re-Evaluation  07/22/18    PT Start Time  1438    PT Stop Time  1520    PT Time Calculation (min)  42 min    Activity Tolerance  Patient tolerated treatment well    Behavior During Therapy  [facility] for tasks assessed/performed       Past Medical History:  Diagnosis Date  . Adenomatous colon polyp 03/1988  . Anemia    borderline  . Arthritis    R shoulder - degenerative   . Cataract    beginning stages  . Chest pain     Sept 2011: stress test neg  . Depression    sees Dr.Cotle  . Diabetes mellitus    dr Buddy Duty  . Diverticulosis   . Fatty liver    Increased LFTs, saw GI 06/2011, likely from fatty liver   . GERD (gastroesophageal reflux disease)   . History of hiatal hernia   . Hyperlipemia   . Hypertension   . IBS (irritable bowel syndrome)    and dyspepsia  . Memory impairment 08/2014   mild cognitive impairment vs. mild dementia, reommended reinstating of cholinesterase inhibitor   . Osteopenia    dexa 06/2007 and 10/11 Rx CA vitamin d  . RLS (restless legs syndrome)    h/o- no longer using Requip    Past Surgical History:  Procedure Laterality Date  . BREAST BIOPSY Left 10/11/2015   fibroadenoma w/ calcifications, no evidence of malignancy, recommend mammogram repeat-6 mnths  . POLYPECTOMY    . REVERSE SHOULDER ARTHROPLASTY Right 04/21/2014   Procedure: REVERSE SHOULDER ARTHROPLASTY;  Surgeon: Tania Ade, MD;  Location: [facility];  Service: Orthopedics;  Laterality: Right;  Right reverse total shoulder replacement  . TONSILLECTOMY    . UTERINE FIBROID EMBOLIZATION      90s    There were no vitals filed for this visit.  Subjective Assessment - 07/08/18 1446    Subjective  Pt had accident the other day, but states she is 75% better.  Pt states she is doing really well at night.    Patient Stated Goals  stop having leakage     Currently in Pain?  No/denies                    Pelvic Floor Special Questions - 07/08/18 0001    Pelvic Floor Internal Exam  pt identified and informed consent given to perform internal soft tissue assessment and treatment    Exam Type  Vaginal    Strength  weak squeeze, no lift    Biofeedback  supine with tactile cues internally for engaging pelvic floor muscles        [facility] Adult PT Treatment/Exercise - 07/08/18 0001      Neuro Re-ed    Neuro Re-ed Details   cues to engage core and pelvic floor      Lumbar Exercises: Standing   Other Standing Lumbar Exercises  hip flexion and adduction yellow band - 10x each side with kegel      Lumbar Exercises: Supine   Large Ball Abdominal  Isometric Limitations  small blue ball between knees with cues to engage TrA and kegel             PT Education - 07/08/18 1524    Education Details  Access Code: [email or web address]    Person(s) Educated  Patient    Methods  Explanation;Demonstration;Verbal cues;Tactile cues;Handout    Comprehension  Verbalized understanding;Returned demonstration          PT Long Term Goals - 07/08/18 1443      PT LONG TERM GOAL #1   Title  Pt will report at least 50% less urinary and fecal leakage     Baseline  75% better    Status  Achieved      PT LONG TERM GOAL #2   Title  pt will be ind with advanced HEP    Baseline  still unable to stop the flow of urine after it starts    Status  On-going      PT LONG TERM GOAL #3   Title  Pt will demonstrate 4+/5 MMT bil hip adduction for improved activation of pelvic floor in standing    Baseline  4+/5    Status  Achieved      PT LONG TERM GOAL #4   Title  Pt will demonstrate 5x sit  to stand in <13 seconds for reduced risk of falls    Status  On-going            Plan - 07/08/18 1524    Clinical Impression Statement  Pt is doing 75% better and achieved 2 of her long term goals.   She did better with contracting and not lifting her hips and not bearing down when tightening her pelvic floor.  Pt is slow to contract at times and still needs a lot of cues to know when she is contracting the pelvic floor. Pt will benefit from skilled PT to continue strengthening for greater bladder control to allow her to perform all of her normal daily activities.    PT Treatment/Interventions  ADLs/Self Care Home Management;Biofeedback;Dry needling;Manual techniques;Neuromuscular re-education;Therapeutic exercise;Therapeutic activities;Patient/family education    PT Next Visit Plan  assess pelvic floor with internal and biofeedback, hip strength, kegel in variety of positions and functional movements    PT Home Exercise Plan   Access Code: [email or web address]     Consulted and Agree with Plan of Care  Patient       Patient will benefit from skilled therapeutic intervention in order to improve the following deficits and impairments:  Abnormal gait, Postural dysfunction, Decreased strength, Decreased coordination  Visit Diagnosis: 1. Muscle weakness (generalized)   2. Unspecified lack of coordination        Problem List Patient Active Problem List   Diagnosis Date Noted  . Dark stools 03/17/2017  . SIADH (syndrome of inappropriate ADH production) ([place]) 10/10/2016  . Gastroesophageal reflux disease 08/06/2016  . PCP Notes >>>>>>>>>>>>>>>>> 09/21/2014  . Rotator cuff tear arthropathy 04/21/2014  . Abnormal MRI, shoulder 12/01/2013  . 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 Cervical radiculitis 11/10/2013  . Bursitis of right shoulder 08/19/2013  . Primary localized osteoarthrosis, lower leg 08/19/2013  . Vitamin B 12 deficiency 05/20/2013  . OSA (obstructive sleep apnea)  02/13/2012  . Pulmonary nodule, last  CT 2016, stable, no f/u 01/28/2012  . Dementia ([place]) 05/29/2011  . Fatty liver 02/27/2011  . Annual physical exam 11/26/2010  . Osteopenia 10/09/2007  . COLONIC POLYPS 08/22/2006  . DM II (diabetes mellitus, type II),  controlled ([place]) 05/19/2006  . Hyperlipidemia 05/19/2006  . Depression 05/19/2006  . SYNDROME, RESTLESS LEGS 05/19/2006  . Essential hypertension 05/19/2006  . IBS -- constipation 05/19/2006    Jule Ser, PT 07/08/2018, 3:30 PM  [facility] [address] [place], [place], [address] Phone: [phone]   Fax:  [phone]  Name: CARMYN HAMM MRN: [id number] Date of Birth: 05/28/1945

## 2018-07-15 ENCOUNTER — Ambulatory Visit: Payer: Medicare Other | Admitting: Physical Therapy

## 2018-07-15 ENCOUNTER — Telehealth: Payer: Self-pay | Admitting: Podiatry

## 2018-07-15 ENCOUNTER — Encounter: Payer: Self-pay | Admitting: Physical Therapy

## 2018-07-15 ENCOUNTER — Other Ambulatory Visit: Payer: Self-pay

## 2018-07-15 DIAGNOSIS — M6281 Muscle weakness (generalized): Secondary | ICD-10-CM

## 2018-07-15 DIAGNOSIS — R279 Unspecified lack of coordination: Secondary | ICD-10-CM

## 2018-07-15 NOTE — Telephone Encounter (Signed)
Pt left message yesterday asking if she could get her shoes when she is here on 7.21.2020 while seeing Dr Milinda Pointer.  I returned call and left a message that pt told me she was seeing Dr Buddy Duty 8.12.2020 and that we cannot get the shoes until she has that visit due to medicare compliance. But if she had the appt changed to let me know and I will send paperwork over to be filled out to Dr Buddy Duty.

## 2018-07-15 NOTE — Therapy (Signed)
[facility] [address], [place], [place], [address] Phone: [phone]   Fax:  [phone]  Physical Therapy Treatment  Patient Details  Name: [name] MRN: [id number] Date of Birth: May 11, 1945 Referring Provider (PT): Colon Branch, MD   Encounter Date: 07/15/2018  PT End of Session - 07/15/18 1518    Visit Number  11    Date for PT Re-Evaluation  07/22/18    PT Start Time  1432    PT Stop Time  1513    PT Time Calculation (min)  41 min    Activity Tolerance  Patient tolerated treatment well    Behavior During Therapy  [facility] for tasks assessed/performed       Past Medical History:  Diagnosis Date  . Adenomatous colon polyp 03/1988  . Anemia    borderline  . Arthritis    R shoulder - degenerative   . Cataract    beginning stages  . Chest pain     Sept 2011: stress test neg  . Depression    sees Dr.Cotle  . Diabetes mellitus    dr Buddy Duty  . Diverticulosis   . Fatty liver    Increased LFTs, saw GI 06/2011, likely from fatty liver   . GERD (gastroesophageal reflux disease)   . History of hiatal hernia   . Hyperlipemia   . Hypertension   . IBS (irritable bowel syndrome)    and dyspepsia  . Memory impairment 08/2014   mild cognitive impairment vs. mild dementia, reommended reinstating of cholinesterase inhibitor   . Osteopenia    dexa 06/2007 and 10/11 Rx CA vitamin d  . RLS (restless legs syndrome)    h/o- no longer using Requip    Past Surgical History:  Procedure Laterality Date  . BREAST BIOPSY Left 10/11/2015   fibroadenoma w/ calcifications, no evidence of malignancy, recommend mammogram repeat-6 mnths  . POLYPECTOMY    . REVERSE SHOULDER ARTHROPLASTY Right 04/21/2014   Procedure: REVERSE SHOULDER ARTHROPLASTY;  Surgeon: Tania Ade, MD;  Location: [facility];  Service: Orthopedics;  Laterality: Right;  Right reverse total shoulder replacement  . TONSILLECTOMY    . UTERINE FIBROID EMBOLIZATION      90s    There were no vitals filed for this visit.  Subjective Assessment - 07/15/18 1436    Subjective  I am not having so much leakage, but if I have to get to the bathroom it is very urgent. If I go to church it takes longer to get to the bathroom and I will leak.  I can stop the leaking sometimes which I couldn't do before.  i can feel the muscles contracting better now.    Patient Stated Goals  stop having leakage     Currently in Pain?  No/denies                       [facility] Adult PT Treatment/Exercise - 07/15/18 0001      Self-Care   Self-Care  Other Self-Care Comments    Other Self-Care Comments   urge to void techniques educated and performed on the way to the bathroom      Lumbar Exercises: Supine   Bent Knee Raise  15 reps;1 second   TrA and kegel with ball squeeze   Straight Leg Raise  10 reps;2 seconds   with kegel   Large Ball Abdominal Isometric Limitations  small blue ball between knees with cues to engage TrA  and kegel      Knee/Hip Exercises: Standing   Functional Squat  10 reps   mini squat with cues to hinge at hips; kegel   SLS  weight shift with kegel - 10x each side      Knee/Hip Exercises: Seated   Long Arc Quad  Strengthening;Right;Left;10 reps   with ball squeeze and kegel            PT Education - 07/15/18 1524    Education Details  Access Code: [email or web address] and urge to void    Person(s) Educated  Patient    Methods  Explanation;Demonstration;Handout;Verbal cues;Tactile cues    Comprehension  Verbalized understanding;Returned demonstration          PT Long Term Goals - 07/08/18 1443      PT LONG TERM GOAL #1   Title  Pt will report at least 50% less urinary and fecal leakage     Baseline  75% better    Status  Achieved      PT LONG TERM GOAL #2   Title  pt will be ind with advanced HEP    Baseline  still unable to stop the flow of urine after it starts    Status  On-going      PT LONG TERM GOAL #3   Title  Pt  will demonstrate 4+/5 MMT bil hip adduction for improved activation of pelvic floor in standing    Baseline  4+/5    Status  Achieved      PT LONG TERM GOAL #4   Title  Pt will demonstrate 5x sit to stand in <13 seconds for reduced risk of falls    Status  On-going            Plan - 07/15/18 1521    Clinical Impression Statement  Pt is making good progress.  She is able to feel the contraction much more and has been able to stop the flow of urine occasionally.  Sometimes she is still having accidents and unable to stop it. pt did well with education on controlling the urge to void and reports it helped as she was going to use the bathroom in the clinic today.  She will benefit from skilled PT to continue working on strength in different positions.    PT Treatment/Interventions  ADLs/Self Care Home Management;Biofeedback;Dry needling;Manual techniques;Neuromuscular re-education;Therapeutic exercise;Therapeutic activities;Patient/family education    PT Next Visit Plan  RE-EVAL next; re-assess pelvic floor strength and edit goals as needed, hip and core strength    PT Home Exercise Plan   Access Code: [email or web address]     Consulted and Agree with Plan of Care  Patient       Patient will benefit from skilled therapeutic intervention in order to improve the following deficits and impairments:  Abnormal gait, Postural dysfunction, Decreased strength, Decreased coordination  Visit Diagnosis: 1. Muscle weakness (generalized)   2. Unspecified lack of coordination        Problem List Patient Active Problem List   Diagnosis Date Noted  . Dark stools 03/17/2017  . SIADH (syndrome of inappropriate ADH production) ([place]) 10/10/2016  . Gastroesophageal reflux disease 08/06/2016  . PCP Notes >>>>>>>>>>>>>>>>> 09/21/2014  . Rotator cuff tear arthropathy 04/21/2014  . Abnormal MRI, shoulder 12/01/2013  . 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 Cervical radiculitis 11/10/2013  . Bursitis of  right shoulder 08/19/2013  . Primary localized osteoarthrosis, lower leg 08/19/2013  . Vitamin B 12 deficiency 05/20/2013  . OSA (obstructive sleep  apnea) 02/13/2012  . Pulmonary nodule, last  CT 2016, stable, no f/u 01/28/2012  . Dementia ([place]) 05/29/2011  . Fatty liver 02/27/2011  . Annual physical exam 11/26/2010  . Osteopenia 10/09/2007  . COLONIC POLYPS 08/22/2006  . DM II (diabetes mellitus, type II), controlled ([place]) 05/19/2006  . Hyperlipidemia 05/19/2006  . Depression 05/19/2006  . SYNDROME, RESTLESS LEGS 05/19/2006  . Essential hypertension 05/19/2006  . IBS -- constipation 05/19/2006    Jule Ser, PT 07/15/2018, 3:25 PM  [facility] [address], [place], [place], [address] Phone: [phone]   Fax:  [phone]  Name: [name] MRN: [id number] Date of Birth: October 31, 1945

## 2018-07-15 NOTE — Patient Instructions (Addendum)
Relaxation Exercises with the Urge to Void   When you experience an urge to void:  FIRST  Stop and stand very still    Sit down if you can    Don't move    You need to stay very still to maintain control  SECOND Squeeze your pelvic floor muscles 5 times, like a quick flick, to keep from leaking  THIRD Relax  Take a deep breath and then let it out  Try to make the urge go away by using relaxation and visualization techniques  FINALLY When you feel the urge go away somewhat, walk normally to the bathroom.   If the urge gets suddenly stronger on the way, you may stop again and relax to regain control.  [facility] [address] [place],  [address] Phone # [phone] Fax [phone]   Access Code: [email or web address]  URL: [email or web address]  Date: 07/15/2018  Prepared by: Jari Favre   Exercises  Ball squeeze with Kegel - 10 reps - 1 sets - 3 sec hold - 1x daily - 7x weekly  Supine Pelvic Floor Contraction - 10 reps - 1 sets - 1 sec hold - 1x daily - 7x weekly  Supine Straight Leg Raise with Pelvic Floor Contraction - 10 reps - 2 sets - 2x daily - 7x weekly  Seated Long Arc Quad with Hip Adduction - 10 reps - 3 sets - 1x daily - 7x weekly

## 2018-07-21 ENCOUNTER — Other Ambulatory Visit: Payer: Self-pay

## 2018-07-21 ENCOUNTER — Ambulatory Visit: Payer: Medicare Other | Admitting: Physical Therapy

## 2018-07-21 ENCOUNTER — Telehealth: Payer: Self-pay | Admitting: Internal Medicine

## 2018-07-21 DIAGNOSIS — M6281 Muscle weakness (generalized): Secondary | ICD-10-CM

## 2018-07-21 DIAGNOSIS — R279 Unspecified lack of coordination: Secondary | ICD-10-CM

## 2018-07-21 NOTE — Telephone Encounter (Signed)
daughter [name] called to cancel 7/15 appt and wants to push out 6 months, they don't want to have to bring pt in, pt has dementia and the appointments make her paranoid about her health conditions. Is it ok to push appt to Jan 2021 for a physical or will pt need to have a virtual/labs/in office sooner?

## 2018-07-21 NOTE — Telephone Encounter (Signed)
Please advise 

## 2018-07-21 NOTE — Patient Instructions (Signed)
Access Code: [email or web address]  URL: [email or web address]  Date: 07/21/2018  Prepared by: Jari Favre   Exercises  Ball squeeze with Kegel - 10 reps - 1 sets - 3 sec hold - 1x daily - 7x weekly  Supine Pelvic Floor Contraction - 10 reps - 1 sets - 1 sec hold - 1x daily - 7x weekly  Supine Straight Leg Raise with Pelvic Floor Contraction - 10 reps - 2 sets - 2x daily - 7x weekly  Seated Long Arc Quad with Hip Adduction - 10 reps - 1 sets - 5 sec hold - 1x daily - 7x weekly

## 2018-07-21 NOTE — Therapy (Addendum)
[facility] [address] [place], [place], [address] Phone: [phone]   Fax:  [phone]  Physical Therapy Treatment  Patient Details  Name: [name] MRN: [id number] Date of Birth: 23-Oct-1945 Referring Provider (PT): Colon Branch, MD   Encounter Date: 07/21/2018  PT End of Session - 07/21/18 1444    Visit Number  12    Date for PT Re-Evaluation  07/22/18    PT Start Time  1440    PT Stop Time  1520    PT Time Calculation (min)  40 min    Activity Tolerance  Patient tolerated treatment well    Behavior During Therapy  [facility] for tasks assessed/performed       Past Medical History:  Diagnosis Date  . Adenomatous colon polyp 03/1988  . Anemia    borderline  . Arthritis    R shoulder - degenerative   . Cataract    beginning stages  . Chest pain     Sept 2011: stress test neg  . Depression    sees Dr.Cotle  . Diabetes mellitus    dr Buddy Duty  . Diverticulosis   . Fatty liver    Increased LFTs, saw GI 06/2011, likely from fatty liver   . GERD (gastroesophageal reflux disease)   . History of hiatal hernia   . Hyperlipemia   . Hypertension   . IBS (irritable bowel syndrome)    and dyspepsia  . Memory impairment 08/2014   mild cognitive impairment vs. mild dementia, reommended reinstating of cholinesterase inhibitor   . Osteopenia    dexa 06/2007 and 10/11 Rx CA vitamin d  . RLS (restless legs syndrome)    h/o- no longer using Requip    Past Surgical History:  Procedure Laterality Date  . BREAST BIOPSY Left 10/11/2015   fibroadenoma w/ calcifications, no evidence of malignancy, recommend mammogram repeat-6 mnths  . POLYPECTOMY    . REVERSE SHOULDER ARTHROPLASTY Right 04/21/2014   Procedure: REVERSE SHOULDER ARTHROPLASTY;  Surgeon: Tania Ade, MD;  Location: [facility];  Service: Orthopedics;  Laterality: Right;  Right reverse total shoulder replacement  . TONSILLECTOMY    . UTERINE FIBROID  EMBOLIZATION     90s    There were no vitals filed for this visit.  Subjective Assessment - 07/21/18 1449    Subjective  I feel 75% better with less leakage. I am feeling pretty good with the exer                       [facility] Adult PT Treatment/Exercise - 07/21/18 0001      Lumbar Exercises: Aerobic   Nustep  L2x6 min - activating core   PT present for status update     Lumbar Exercises: Seated   Long Arc Quad on Chair  Strengthening;Right;Left;2 sets;10 reps;Limitations    LAQ on Chair Limitations  one set with 5 sec hold - ball squeeze with kegel    Hip Flexion on Ball Limitations  hip flexion in chair with green band around thighs - cues to brace and sit up tall- 10x      Lumbar Exercises: Supine   Bent Knee Raise  5 seconds;10 reps   TrA and kegel with ball squeeze; rest 5 sec between reps   Straight Leg Raise  10 reps;2 seconds   with kegel            PT Education - 07/21/18 1530    Education Details  Access Code: [email or web address]    Person(s) Educated  Patient    Methods  Explanation;Demonstration;Handout;Verbal cues    Comprehension  Verbalized understanding;Returned demonstration          PT Long Term Goals - 07/21/18 1451      PT LONG TERM GOAL #1   Title  Pt will report at least 50% less urinary and fecal leakage     Baseline  75% better    Status  Achieved      PT LONG TERM GOAL #2   Title  pt will be ind with advanced HEP    Status  Achieved      PT LONG TERM GOAL #3   Title  Pt will demonstrate 4+/5 MMT bil hip adduction for improved activation of pelvic floor in standing    Status  Achieved      PT LONG TERM GOAL #4   Title  Pt will demonstrate 5x sit to stand in <13 seconds for reduced risk of falls    Baseline  11 seconds    Status  Achieved            Plan - 07/21/18 1523    Clinical Impression Statement  Pt was able to perform HEP.  She is feeling confident and was able to hold kegel for 5 seconds.  She understands how  to progress her exercises with longer holds.  Overall, she has met all of her functional goals at this time.  She will discharge from skilled PT with HEP today.    PT Treatment/Interventions  ADLs/Self Care Home Management;Biofeedback;Dry needling;Manual techniques;Neuromuscular re-education;Therapeutic exercise;Therapeutic activities;Patient/family education    PT Next Visit Plan  discharge    PT Home Exercise Plan   Access Code: [email or web address]     Consulted and Agree with Plan of Care  Patient       Patient will benefit from skilled therapeutic intervention in order to improve the following deficits and impairments:  Abnormal gait, Postural dysfunction, Decreased strength, Decreased coordination  Visit Diagnosis: 1. Muscle weakness (generalized)   2. Unspecified lack of coordination        Problem List Patient Active Problem List   Diagnosis Date Noted  . Dark stools 03/17/2017  . SIADH (syndrome of inappropriate ADH production) ([place]) 10/10/2016  . Gastroesophageal reflux disease 08/06/2016  . PCP Notes >>>>>>>>>>>>>>>>> 09/21/2014  . Rotator cuff tear arthropathy 04/21/2014  . Abnormal MRI, shoulder 12/01/2013  . 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 Cervical radiculitis 11/10/2013  . Bursitis of right shoulder 08/19/2013  . Primary localized osteoarthrosis, lower leg 08/19/2013  . Vitamin B 12 deficiency 05/20/2013  . OSA (obstructive sleep apnea) 02/13/2012  . Pulmonary nodule, last  CT 2016, stable, no f/u 01/28/2012  . Dementia ([place]) 05/29/2011  . Fatty liver 02/27/2011  . Annual physical exam 11/26/2010  . Osteopenia 10/09/2007  . COLONIC POLYPS 08/22/2006  . DM II (diabetes mellitus, type II), controlled ([place]) 05/19/2006  . Hyperlipidemia 05/19/2006  . Depression 05/19/2006  . SYNDROME, RESTLESS LEGS 05/19/2006  . Essential hypertension 05/19/2006  . IBS -- constipation 05/19/2006    Jule Ser, PT 07/21/2018, 3:31 PM  [facility] [address], [place], [place], [address] Phone: [phone]   Fax:  [phone]  Name: [name] MRN: [id number] Date of Birth: [birth year]  PHYSICAL THERAPY DISCHARGE SUMMARY  Visits from Start of Care: 12  Current functional level related to goals / functional outcomes: See above goals  Remaining deficits: See above   Education / Equipment: HEP Plan: Patient agrees to discharge.  Patient goals were partially met. Patient is being discharged due to not returning since the last visit.  ?????     [employer], PT 09/07/18 8:19 AM

## 2018-07-21 NOTE — Telephone Encounter (Signed)
Advised patient's daughter, okay to cancel & reschedule a visit in 6 months. Recommend early flu shot this fall Call sooner if needed

## 2018-07-21 NOTE — Telephone Encounter (Signed)
LMOM for [place] of below.

## 2018-07-22 ENCOUNTER — Ambulatory Visit: Payer: Self-pay | Admitting: Internal Medicine

## 2018-07-28 ENCOUNTER — Other Ambulatory Visit: Payer: Self-pay

## 2018-07-28 ENCOUNTER — Ambulatory Visit (INDEPENDENT_AMBULATORY_CARE_PROVIDER_SITE_OTHER): Payer: Medicare Other | Admitting: Podiatry

## 2018-07-28 ENCOUNTER — Encounter: Payer: Self-pay | Admitting: Podiatry

## 2018-07-28 VITALS — Temp 98.0°F

## 2018-07-28 DIAGNOSIS — Q828 Other specified congenital malformations of skin: Secondary | ICD-10-CM | POA: Diagnosis not present

## 2018-07-28 DIAGNOSIS — M79676 Pain in unspecified toe(s): Secondary | ICD-10-CM

## 2018-07-28 DIAGNOSIS — B351 Tinea unguium: Secondary | ICD-10-CM | POA: Diagnosis not present

## 2018-07-29 ENCOUNTER — Encounter: Payer: Self-pay | Admitting: Podiatry

## 2018-07-29 ENCOUNTER — Ambulatory Visit: Payer: Medicare Other | Admitting: Physical Therapy

## 2018-07-29 NOTE — Progress Notes (Signed)
She presents today chief complaint of painfully elongated toenails and calluses.  Objective: Nails are long thick yellow dystrophic onychomycotic multiple reactive hyper keratomas bilaterally.  No open lesions or wounds.  Assessment: Pain limb secondary onychomycosis poor keratomas plantar aspect of the forefoot.  Plan: Debridement of multiple lesions plantar aspect of the forefoot debridement of toenails 1 through 5 bilateral.

## 2018-08-05 ENCOUNTER — Encounter: Payer: Medicare Other | Admitting: Physical Therapy

## 2018-08-06 ENCOUNTER — Other Ambulatory Visit: Payer: Self-pay | Admitting: Internal Medicine

## 2018-08-07 ENCOUNTER — Telehealth: Payer: Self-pay | Admitting: *Deleted

## 2018-08-07 NOTE — Telephone Encounter (Signed)
Pt's dtr, [name] is calling concerning the diabetic shoes. I called and told [name] I would need to transfer her to the Orthotic and Diabetic shoe Coordinator. Transferred to D. Miner.

## 2018-08-10 ENCOUNTER — Encounter: Payer: Self-pay | Admitting: Internal Medicine

## 2018-08-10 LAB — HEMOGLOBIN A1C: Hemoglobin A1C: 6

## 2018-08-17 ENCOUNTER — Ambulatory Visit: Payer: Medicare Other | Admitting: Gastroenterology

## 2018-08-18 ENCOUNTER — Emergency Department (HOSPITAL_BASED_OUTPATIENT_CLINIC_OR_DEPARTMENT_OTHER)
Admission: EM | Admit: 2018-08-18 | Discharge: 2018-08-18 | Disposition: A | Discharge status: Home / Self Care | Payer: Medicare Other | Attending: Emergency Medicine | Admitting: Emergency Medicine

## 2018-08-18 ENCOUNTER — Ambulatory Visit: Payer: Self-pay | Admitting: *Deleted

## 2018-08-18 ENCOUNTER — Emergency Department (HOSPITAL_BASED_OUTPATIENT_CLINIC_OR_DEPARTMENT_OTHER): Payer: Medicare Other

## 2018-08-18 ENCOUNTER — Encounter (HOSPITAL_BASED_OUTPATIENT_CLINIC_OR_DEPARTMENT_OTHER): Payer: Self-pay | Admitting: *Deleted

## 2018-08-18 ENCOUNTER — Other Ambulatory Visit: Payer: Self-pay

## 2018-08-18 DIAGNOSIS — Z8719 Personal history of other diseases of the digestive system: Secondary | ICD-10-CM | POA: Diagnosis not present

## 2018-08-18 DIAGNOSIS — E785 Hyperlipidemia, unspecified: Secondary | ICD-10-CM | POA: Diagnosis not present

## 2018-08-18 DIAGNOSIS — I10 Essential (primary) hypertension: Secondary | ICD-10-CM | POA: Diagnosis not present

## 2018-08-18 DIAGNOSIS — R5383 Other fatigue: Secondary | ICD-10-CM | POA: Insufficient documentation

## 2018-08-18 DIAGNOSIS — R1013 Epigastric pain: Secondary | ICD-10-CM | POA: Diagnosis not present

## 2018-08-18 DIAGNOSIS — Z79899 Other long term (current) drug therapy: Secondary | ICD-10-CM | POA: Insufficient documentation

## 2018-08-18 DIAGNOSIS — Z87891 Personal history of nicotine dependence: Secondary | ICD-10-CM | POA: Insufficient documentation

## 2018-08-18 DIAGNOSIS — Z7982 Long term (current) use of aspirin: Secondary | ICD-10-CM | POA: Insufficient documentation

## 2018-08-18 DIAGNOSIS — E119 Type 2 diabetes mellitus without complications: Secondary | ICD-10-CM | POA: Diagnosis not present

## 2018-08-18 LAB — CBC WITH DIFFERENTIAL/PLATELET
Abs Immature Granulocytes: 0.03 10*3/uL (ref 0.00–0.07)
Basophils Absolute: 0 10*3/uL (ref 0.0–0.1)
Basophils Relative: 0 %
Eosinophils Absolute: 0.3 10*3/uL (ref 0.0–0.5)
Eosinophils Relative: 4 %
HCT: 37.8 % (ref 36.0–46.0)
Hemoglobin: 12.4 g/dL (ref 12.0–15.0)
Immature Granulocytes: 0 %
Lymphocytes Relative: 32 %
Lymphs Abs: 2.2 10*3/uL (ref 0.7–4.0)
MCH: 28.9 pg (ref 26.0–34.0)
MCHC: 32.8 g/dL (ref 30.0–36.0)
MCV: 88.1 fL (ref 80.0–100.0)
Monocytes Absolute: 0.4 10*3/uL (ref 0.1–1.0)
Monocytes Relative: 6 %
Neutro Abs: 3.9 10*3/uL (ref 1.7–7.7)
Neutrophils Relative %: 58 %
Platelets: 161 10*3/uL (ref 150–400)
RBC: 4.29 MIL/uL (ref 3.87–5.11)
RDW: 13 % (ref 11.5–15.5)
WBC: 6.9 10*3/uL (ref 4.0–10.5)
nRBC: 0 % (ref 0.0–0.2)

## 2018-08-18 LAB — COMPREHENSIVE METABOLIC PANEL
ALT: 26 U/L (ref 0–44)
AST: 29 U/L (ref 15–41)
Albumin: 4.4 g/dL (ref 3.5–5.0)
Alkaline Phosphatase: 53 U/L (ref 38–126)
Anion gap: 10 (ref 5–15)
BUN: 17 mg/dL (ref 8–23)
CO2: 25 mmol/L (ref 22–32)
Calcium: 9.2 mg/dL (ref 8.9–10.3)
Chloride: 105 mmol/L (ref 98–111)
Creatinine, Ser: 0.73 mg/dL (ref 0.44–1.00)
GFR calc Af Amer: >60 mL/min (ref >60)
GFR calc non Af Amer: >60 mL/min (ref >60)
Glucose, Bld: 125 mg/dL — ABNORMAL HIGH (ref 70–99)
Potassium: 3.8 mmol/L (ref 3.5–5.1)
Sodium: 140 mmol/L (ref 135–145)
Total Bilirubin: 0.5 mg/dL (ref 0.3–1.2)
Total Protein: 7.4 g/dL (ref 6.5–8.1)

## 2018-08-18 LAB — LIPASE, BLOOD: Lipase: 27 U/L (ref 11–51)

## 2018-08-18 MED ORDER — IOHEXOL 300 MG/ML  SOLN
100.0000 mL | Freq: Once | INTRAMUSCULAR | Status: AC | PRN
  Administered 2018-08-18: 100 mL via INTRAVENOUS

## 2018-08-18 NOTE — ED Triage Notes (Signed)
Epigastric pain and dark stools for a few days. No hx of GI bleed in the past. Fatigue.

## 2018-08-18 NOTE — ED Notes (Signed)
Pt provided urine sample but cup spilled into bag. Specimen was taken to lab but insufficient. Pt made aware for the need for additional urine sample.

## 2018-08-18 NOTE — Discharge Instructions (Addendum)
Work-up labs without any significant abnormality.  No evidence of any significant anemia.  Chest x-ray negative and CT scan of the abdomen without any acute findings.  Would recommend returning for any recurrent black bowel movements occurring several times.  Return for any blood in the bowel movements.  Make an appointment to follow-up with your primary care doctor in the next few days for recheck.

## 2018-08-18 NOTE — ED Notes (Signed)
Patient transported to CT 

## 2018-08-18 NOTE — Telephone Encounter (Signed)
FYI. Pt headed to ED.  

## 2018-08-18 NOTE — ED Provider Notes (Signed)
[facility] EMERGENCY DEPARTMENT Provider Note   CSN: [id number] Arrival date & time: 08/18/18  1635     History   Chief Complaint Chief Complaint  Patient presents with   Abdominal Pain    HPI [name] is a 73 y.o. female.     Patient with a complaint of being tired and fatigued for about a week.  For the past few days noticed to intermittent episodes of bowel movements that may have been black in color.  But did not notice any red blood.  No fevers denied any upper respiratory symptoms but did note a little bit of cough.  Does states she has a dry cough.  Complain of some mild epigastric abdominal pain.  No dysuria.  Past medical history is significant for hypertension diverticulosis.  Questionable diabetes.  History of anemia.  And history of some memory impairment.  Mild dementia.  History of restless leg syndrome.     Past Medical History:  Diagnosis Date   Adenomatous colon polyp 03/1988   Anemia    borderline   Arthritis    R shoulder - degenerative    Cataract    beginning stages   Chest pain     Sept 2011: stress test neg   Depression    sees Dr.Cotle   Diabetes mellitus    dr Buddy Duty   Diverticulosis    Fatty liver    Increased LFTs, saw GI 06/2011, likely from fatty liver    GERD (gastroesophageal reflux disease)    History of hiatal hernia    Hyperlipemia    Hypertension    IBS (irritable bowel syndrome)    and dyspepsia   Memory impairment 08/2014   mild cognitive impairment vs. 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/2013   Cervical radiculitis 11/10/2013   Bursitis of right shoulder 08/19/2013   Primary localized osteoarthrosis, lower leg 08/19/2013   Vitamin B 12 deficiency 05/20/2013   OSA (obstructive sleep apnea) 02/13/2012   Pulmonary nodule, last  CT 2016, stable, no f/u 01/28/2012   Dementia ([place]) 05/29/2011   Fatty liver 02/27/2011   Annual physical exam 11/26/2010   Osteopenia 10/09/2007   COLONIC POLYPS 08/22/2006   DM II (diabetes mellitus, type II), controlled ([facility]) 05/19/2006   Hyperlipidemia 05/19/2006   Depression 05/19/2006   SYNDROME, RESTLESS LEGS 05/19/2006   Essential hypertension 05/19/2006   IBS -- constipation 05/19/2006    Past Surgical History:  Procedure Laterality Date   BREAST BIOPSY Left 10/11/2015   fibroadenoma w/ calcifications, no evidence of malignancy, recommend mammogram repeat-6 mnths   POLYPECTOMY     REVERSE SHOULDER ARTHROPLASTY Right 04/21/2014   Procedure: REVERSE SHOULDER ARTHROPLASTY;  Surgeon: Tania Ade, MD;  Location: [facility];  Service: Orthopedics;  Laterality: Right;  Right reverse total shoulder replacement   TONSILLECTOMY     UTERINE FIBROID EMBOLIZATION     90s     OB History   No obstetric history on file.  Home Medications    Prior to Admission medications   Medication Sig Start Date End Date Taking? Authorizing Provider  ACCU-CHEK FASTCLIX LANCETS MISC daily. use as directed 04/26/17   [provider]  acetaminophen (TYLENOL) 325 MG tablet Take 650 mg by mouth every 6 (six) hours as needed for mild pain or headache.    [provider]  aspirin (ASPIRIN LOW DOSE) 81 MG EC tablet Take 1 tablet (81 mg total) by mouth daily. 01/05/18   Colon Branch, MD  atenolol (TENORMIN) 50 MG tablet Take 25 mg by mouth daily. 07/15/18   [provider]  atorvastatin (LIPITOR) 40 MG tablet Take  1 tablet (40 mg total) by mouth daily. 07/07/18   Colon Branch, MD  azelastine (ASTELIN) 0.1 % nasal spray Place 2 sprays into both nostrils at bedtime as needed for rhinitis. Use in each nostril as directed 07/21/17   Colon Branch, MD  BD VEO INSULIN SYRINGE U/F 31G X 15/64" 1 ML MISC USE TO INJECT insulin EVERY DAY 05/14/17   [provider]  ciprofloxacin (CIPRO) 500 MG tablet Take 1 tablet (500 mg total) by mouth 2 (two) times daily. 06/04/18   Colon Branch, MD  Cyanocobalamin (B-12) 1000 MCG TBCR Take 1 tablet by mouth daily. 08/06/18   Colon Branch, MD  darifenacin (ENABLEX) 15 MG 24 hr tablet TAKE 1 TABLET BY MOUTH EVERY DAY 07/07/18   Colon Branch, MD  donepezil (ARICEPT) 10 MG tablet Take 2 tablets (20 mg total) by mouth at bedtime.    Colon Branch, MD  Ferrous Sulfate (IRON) 325 (65 Fe) MG TABS Take 1 tablet (325 mg total) by mouth daily. 06/16/18   Colon Branch, MD  gabapentin (NEURONTIN) 100 MG capsule TAKE 1 CAPSULE BY MOUTH AT BEDTIME 06/02/18   Colon Branch, MD  Insulin Detemir (LEVEMIR FLEXTOUCH) 100 UNIT/ML Pen Inject 52 Units into the skin at bedtime.     Delrae Rend, MD  losartan (COZAAR) 25 MG tablet Take 1 tablet (25 mg total) by mouth every evening. 08/06/18   Colon Branch, MD  memantine (NAMENDA) 10 MG tablet Take 10 mg by mouth 2 (two) times daily. 02/13/17   [provider]  metFORMIN (GLUCOPHAGE) 1000 MG tablet Take 1,000 mg by mouth 2 (two) times daily. 02/13/17   [provider]  metFORMIN (GLUCOPHAGE-XR) 500 MG 24 hr tablet TAKE 1 TABLET BY MOUTH EVERY MORNING WITH A MEAL 07/15/18   [provider]  Multiple Vitamins-Minerals (CENTRUM SILVER 50+WOMEN) TABS Take 1 tablet by mouth daily. 01/05/18   Colon Branch, MD  ONE TOUCH ULTRA TEST test strip USE TO CHECK BLOOD SUGAR EVERY DAY 10/20/17   [provider]  pantoprazole (PROTONIX) 40 MG tablet Take 1 tablet (40 mg total) by mouth 2 (two) times daily before a meal. 01/05/18   Paz, Alda Berthold, MD    sertraline (ZOLOFT) 100 MG tablet TAKE 1 TABLET BY MOUTH EVERY DAY AND TAKE 1/2 TABLET AT BEDTIME 11/06/17   [provider]  sodium fluoride (PREVIDENT 5000 DRY MOUTH) 1.1 % GEL dental gel Place 1 application onto teeth at bedtime as needed (for teeth).     [provider]  tiZANidine (ZANAFLEX) 2 MG tablet Take 2-4 mg by mouth 2 (two) times daily as needed. 02/14/17   [provider]  traMADol (ULTRAM) 50 MG tablet Take 1 tablet (50 mg total) by mouth every 12 (twelve) hours as needed. 04/10/18  Colon Branch, MD    Family History Family History  Problem Relation Age of Onset   Lung cancer Mother        smoker   Colon cancer Father        F dx in his 84s   Ovarian cancer Paternal Aunt        ?   Diabetes Other        aunts-uncles    Stroke Other        aunts-uncles    Heart attack Maternal Grandmother        67s   Other Other        niece- glioblastoma   Breast cancer Neg Hx    Rectal cancer Neg Hx    Stomach cancer Neg Hx    Esophageal cancer Neg Hx     Social History Social History   Tobacco Use   Smoking status: Former Smoker    Packs/day: 2.00    Years: 10.00    Pack years: 20.00    Types: Cigarettes    Quit date: 11/26/1975    Years since quitting: 42.7   Smokeless tobacco: Never Used   Tobacco comment: used to smoke 2 ppd  Substance Use Topics   Alcohol use: Yes    Alcohol/week: 0.0 standard drinks    Comment: rarely   Drug use: No     Allergies   Sulfa antibiotics, Trulicity [dulaglutide], and Penicillins   Review of Systems Review of Systems  Constitutional: Positive for fatigue. Negative for chills and fever.  HENT: Negative for congestion, rhinorrhea and sore throat.   Eyes: Negative for visual disturbance.  Respiratory: Positive for cough. Negative for shortness of breath.   Cardiovascular: Negative for chest pain and leg swelling.  Gastrointestinal: Positive for abdominal pain. Negative for diarrhea,  nausea and vomiting.  Genitourinary: Negative for dysuria.  Musculoskeletal: Negative for back pain and neck pain.  Skin: Negative for rash.  Neurological: Positive for weakness. Negative for dizziness, light-headedness and headaches.  Hematological: Does not bruise/bleed easily.  Psychiatric/Behavioral: Negative for confusion.     Physical Exam Updated Vital Signs BP 131/61    Pulse (!) 52    Temp 98.2 F (36.8 C) (Oral)    Resp 18    Ht 1.651 m (5\' 5" )    Wt 74.8 kg    SpO2 98%    BMI 27.46 kg/m   Physical Exam Vitals signs and nursing note reviewed.  Constitutional:      General: She is not in acute distress.    Appearance: Normal appearance. She is well-developed.  HENT:     Head: Normocephalic and atraumatic.  Eyes:     Extraocular Movements: Extraocular movements intact.     Conjunctiva/sclera: Conjunctivae normal.     Pupils: Pupils are equal, round, and reactive to light.  Neck:     Musculoskeletal: Neck supple.  Cardiovascular:     Rate and Rhythm: Normal rate and regular rhythm.     Heart sounds: No murmur.  Pulmonary:     Effort: Pulmonary effort is normal. No respiratory distress.     Breath sounds: Normal breath sounds.  Abdominal:     Palpations: Abdomen is soft.     Tenderness: There is no abdominal tenderness.  Genitourinary:    Rectum: Guaiac result negative.  Musculoskeletal: Normal range of motion.  Skin:    General: Skin is warm and dry.  Neurological:     General: No focal deficit present.  Mental Status: She is alert. Mental status is at baseline.     Cranial Nerves: No cranial nerve deficit.     Motor: No weakness.      ED Treatments / Results  Labs (all labs ordered are listed, but only abnormal results are displayed) Labs Reviewed  COMPREHENSIVE METABOLIC PANEL - Abnormal; Notable for the following components:      Result Value   Glucose, Bld 125 (*)    All other components within normal limits  CBC WITH DIFFERENTIAL/PLATELET    LIPASE, BLOOD    EKG EKG Interpretation  Date/Time:  Tuesday August 18 2018 17:29:12 EDT Ventricular Rate:  51 PR Interval:    QRS Duration: 104 QT Interval:  515 QTC Calculation: 475 R Axis:   -50 Text Interpretation:  Sinus rhythm LAD, consider left anterior fascicular block Abnormal R-wave progression, early transition No significant change since last tracing Confirmed by Fredia Sorrow [phone]) on 08/18/2018 5:31:17 PM   Radiology Ct Abdomen Pelvis W Contrast  Result Date: 08/18/2018 CLINICAL DATA:  73 year old female with epigastric pain. Dark stools for few days. EXAM: CT ABDOMEN AND PELVIS WITH CONTRAST TECHNIQUE: Multidetector CT imaging of the abdomen and pelvis was performed using the standard protocol following bolus administration of intravenous contrast. CONTRAST:  145mL OMNIPAQUE IOHEXOL 300 MG/ML  SOLN COMPARISON:  None. FINDINGS: Lower chest: The visualized lung bases are clear. No intra-abdominal free air or free fluid. Hepatobiliary: No focal liver abnormality is seen. No gallstones, gallbladder wall thickening, or biliary dilatation. Pancreas: Unremarkable. No pancreatic ductal dilatation or surrounding inflammatory changes. Spleen: Normal in size without focal abnormality. Adrenals/Urinary Tract: The adrenal glands are unremarkable. There is no hydronephrosis on either side. There is symmetric enhancement and excretion of contrast by both kidneys. Subcentimeter bilateral renal hypodensities are too small to characterize. The visualized ureters and urinary bladder appear unremarkable. Stomach/Bowel: Small sigmoid diverticula. No active inflammatory changes. There is no bowel obstruction or active inflammation. The appendix is normal. Vascular/Lymphatic: Mild atherosclerotic calcification of the abdominal aorta. The IVC is unremarkable. There is a retroaortic left renal vein anatomy. No portal venous gas. There is no adenopathy. Reproductive: The uterus is anteverted. Foci of  calcification to the left of the uterus, likely a partially calcified fibroid. The ovaries are grossly unremarkable as visualized. Other: Small fat containing umbilical hernia. Induration of the periumbilical subcutaneous fat. Correlation with clinical exam and point tenderness recommended to exclude strangulation or herniation. No drainable fluid collection or abscess. Musculoskeletal: Multilevel degenerative changes of the spine. Multilevel disc desiccation and vacuum phenomena. No acute osseous pathology. IMPRESSION: 1. No acute intra-abdominal or pelvic pathology. No bowel obstruction or active inflammation. Normal appendix. 2. Small sigmoid diverticula.  No acute inflammation. 3. Aortic Atherosclerosis (ICD10-I70.0). Electronically Signed   By: Anner Crete M.D.   On: 08/18/2018 19:46   Dg Chest Port 1 View  Result Date: 08/18/2018 CLINICAL DATA:  Cough EXAM: PORTABLE CHEST 1 VIEW COMPARISON:  02/26/2017 FINDINGS: Cardiac shadow is within normal limits. The lungs are well aerated bilaterally. No focal infiltrate or sizable effusion is seen. Right shoulder replacement is noted. IMPRESSION: No active disease. Electronically Signed   By: Inez Catalina M.D.   On: 08/18/2018 19:04    Procedures Procedures (including critical care time)  Medications Ordered in ED Medications  iohexol (OMNIPAQUE) 300 MG/ML solution 100 mL (100 mLs Intravenous Contrast Given 08/18/18 1922)     Initial Impression / Assessment and Plan / ED Course  I have reviewed the triage vital  signs and the nursing notes.  Pertinent labs & imaging results that were available during my care of the patient were reviewed by me and considered in my medical decision making (see chart for details).        Work-up for the fatigue epigastric discomfort in the history of some dark or black bowel movements without any acute findings.  Hemoglobin is at 12.  Electrolytes and liver function test without any significant abnormalities.   Did note a mild cough chest x-ray was negative.  CT scan of the abdomen without any acute abnormalities.  Vital signs without tachycardia no hypotension no fever.  Urinalysis was not obtained.  Patient denies any urinary tract type symptoms.  While patient follow back up with primary care doctor for recheck of blood counts.  Based on the patient's history of intermittent black stools.  Heme-negative here today. Final Clinical Impressions(s) / ED Diagnoses   Final diagnoses:  Fatigue, unspecified type  History of melena    ED Discharge Orders    None       Fredia Sorrow, MD 08/18/18 2016

## 2018-08-18 NOTE — Telephone Encounter (Signed)
Patient is complaining of weakness and fatigue for at least 2 weeks. Patient is reporting dark stools- 1 week. She is taking iron suppliments.  Reason for Disposition . Black or tarry bowel movements  Answer Assessment - Initial Assessment Questions 1. DESCRIPTION: "Describe how you are feeling."     Weak and fatigue 2. SEVERITY: "How bad is it?"  "Can you stand and walk?"   - MILD - Feels weak or tired, but does not interfere with work, school or normal activities   - [place] to stand and walk; weakness interferes with work, school, or normal activities   - SEVERE - Unable to stand or walk     moderate 3. ONSET:  "When did the weakness begin?"     2 weeks  4. CAUSE: "What do you think is causing the weakness?"     Not sure- patient is complaining of dark stool 5. MEDICINES: "Have you recently started a new medicine or had a change in the amount of a medicine?"     Patient states her iron brand has been changed- it has been changes back 6. OTHER SYMPTOMS: "Do you have any other symptoms?" (e.g., chest pain, fever, cough, SOB, vomiting, diarrhea, bleeding, other areas of pain)     Stomach discomfort 7. PREGNANCY: "Is there any chance you are pregnant?" "When was your last menstrual period?"     n/a  Protocols used: WEAKNESS (GENERALIZED) AND FATIGUE-A-AH

## 2018-08-25 ENCOUNTER — Telehealth: Payer: Self-pay | Admitting: Podiatry

## 2018-08-25 NOTE — Telephone Encounter (Signed)
Pt left message on 8/14 checking status of orthotics.  Upon checking she was getting diabetic shoes and inserts.I left message for pt we did get paperwork from Dr Buddy Duty and the inserts are in progress and I expect them in sometime in the next week and I would call pt when they come in to schedule an appt to pick them up.[name]

## 2018-08-26 ENCOUNTER — Ambulatory Visit (INDEPENDENT_AMBULATORY_CARE_PROVIDER_SITE_OTHER): Payer: Medicare Other | Admitting: Internal Medicine

## 2018-08-26 ENCOUNTER — Encounter: Payer: Self-pay | Admitting: Internal Medicine

## 2018-08-26 ENCOUNTER — Other Ambulatory Visit: Payer: Self-pay

## 2018-08-26 VITALS — BP 145/59 | HR 57 | Temp 97.3°F | Resp 16 | Ht 66.0 in | Wt 177.1 lb

## 2018-08-26 DIAGNOSIS — W57XXXA Bitten or stung by nonvenomous insect and other nonvenomous arthropods, initial encounter: Secondary | ICD-10-CM

## 2018-08-26 DIAGNOSIS — R399 Unspecified symptoms and signs involving the genitourinary system: Secondary | ICD-10-CM | POA: Diagnosis not present

## 2018-08-26 DIAGNOSIS — E118 Type 2 diabetes mellitus with unspecified complications: Secondary | ICD-10-CM | POA: Diagnosis not present

## 2018-08-26 DIAGNOSIS — S30861A Insect bite (nonvenomous) of abdominal wall, initial encounter: Secondary | ICD-10-CM

## 2018-08-26 NOTE — Progress Notes (Signed)
Subjective:    Patient ID: [name], female    DOB: April 17, 1945, 73 y.o.   MRN: [id number]  DOS:  08/26/2018 Type of visit - description: Acute She found a tick at her abdomen last night, she removed and has it w/ her today.  It is indeed a 2 to 3 mm tick. The area is slightly red and painful.  Also, went to the ER 08/18/2018, records reviewed: Fatigue, epigastric discomfort, dark stools; CBC, CMP unremarkable portable chest x-ray and CT abdomen with no acute findings  Review of Systems Currently reports she is doing well. Denies fever chills.  No headaches or unusual aches No LUTS Past Medical History:  Diagnosis Date  . Adenomatous colon polyp 03/1988  . Anemia    borderline  . Arthritis    R shoulder - degenerative   . Cataract    beginning stages  . Chest pain     Sept 2011: stress test neg  . Depression    sees Dr.Cotle  . Diabetes mellitus    dr Buddy Duty  . Diverticulosis   . Fatty liver    Increased LFTs, saw GI 06/2011, likely from fatty liver   . GERD (gastroesophageal reflux disease)   . History of hiatal hernia   . Hyperlipemia   . Hypertension   . IBS (irritable bowel syndrome)    and dyspepsia  . Memory impairment 08/2014   mild cognitive impairment vs. mild dementia, reommended reinstating of cholinesterase inhibitor   . Osteopenia    dexa 06/2007 and 10/11 Rx CA vitamin d  . RLS (restless legs syndrome)    h/o- no longer using Requip    Past Surgical History:  Procedure Laterality Date  . BREAST BIOPSY Left 10/11/2015   fibroadenoma w/ calcifications, no evidence of malignancy, recommend mammogram repeat-6 mnths  . POLYPECTOMY    . REVERSE SHOULDER ARTHROPLASTY Right 04/21/2014   Procedure: REVERSE SHOULDER ARTHROPLASTY;  Surgeon: Tania Ade, MD;  Location: [facility];  Service: Orthopedics;  Laterality: Right;  Right reverse total shoulder replacement  . TONSILLECTOMY    . UTERINE FIBROID EMBOLIZATION     90s    Social History    Socioeconomic History  . Marital status: Widowed    Spouse name: [name]  . Number of children: 3  . Years of education: Not on file  . Highest education level: Not on file  Occupational History  . Occupation: retired, was a Leisure centre manager)    Comment: RN  Social Needs  . Financial resource strain: Not on file  . Food insecurity    Worry: Not on file    Inability: Not on file  . Transportation needs    Medical: Not on file    Non-medical: Not on file  Tobacco Use  . Smoking status: Former Smoker    Packs/day: 2.00    Years: 10.00    Pack years: 20.00    Types: Cigarettes    Quit date: 11/26/1975    Years since quitting: 42.7  . Smokeless tobacco: Never Used  . Tobacco comment: used to smoke 2 ppd  Substance and Sexual Activity  . Alcohol use: Yes    Alcohol/week: 0.0 standard drinks    Comment: rarely  . Drug use: No  . Sexual activity: Not Currently  Lifestyle  . Physical activity    Days per week: Not on file    Minutes per session: Not on file  . Stress: Not on file  Relationships  . Social connections  Talks on phone: Not on file    Gets together: Not on file    Attends religious service: Not on file    Active member of club or organization: Not on file    Attends meetings of clubs or organizations: Not on file    Relationship status: Not on file  . Intimate partner violence    Fear of current or ex partner: Not on file    Emotionally abused: Not on file    Physically abused: Not on file    Forced sexual activity: Not on file  Other Topics Concern  . Not on file  Social History Narrative   Widow , lives by herself at [facility] in a 1 bedroom apt;  still drives, daughters [name]) live  in [place] in [place]     5 g-children      Allergies as of 08/26/2018      Reactions   Sulfa Antibiotics Nausea Only   Trulicity [dulaglutide] Nausea Only   Penicillins Rash, Other (See Comments)   Has patient had a PCN reaction causing  immediate rash, facial/tongue/throat swelling, SOB or lightheadedness with hypotension: No Has patient had a PCN reaction causing severe rash involving mucus membranes or skin necrosis: No Has patient had a PCN reaction that required hospitalization No Has patient had a PCN reaction occurring within the last 10 years: No If all of the above answers are "NO", then may proceed with Cephalosporin use.      Medication List       Accurate as of August 26, 2018 11:59 PM. If you have any questions, ask your nurse or doctor.        STOP taking these medications   ciprofloxacin 500 MG tablet Commonly known as: Cipro Stopped by: Kathlene November, MD     TAKE these medications   Accu-Chek FastClix Lancets Misc daily. use as directed   acetaminophen 325 MG tablet Commonly known as: TYLENOL Take 650 mg by mouth every 6 (six) hours as needed for mild pain or headache.   Aricept 10 MG tablet Generic drug: donepezil Take 2 tablets (20 mg total) by mouth at bedtime.   aspirin 81 MG EC tablet Commonly known as: Aspirin Low Dose Take 1 tablet (81 mg total) by mouth daily.   atenolol 50 MG tablet Commonly known as: TENORMIN Take 25 mg by mouth daily.   atorvastatin 40 MG tablet Commonly known as: LIPITOR Take 1 tablet (40 mg total) by mouth daily.   azelastine 0.1 % nasal spray Commonly known as: ASTELIN Place 2 sprays into both nostrils at bedtime as needed for rhinitis. Use in each nostril as directed   B-12 1000 MCG Tbcr Take 1 tablet by mouth daily.   BD Veo Insulin Syringe U/F 31G X 15/64" 1 ML Misc Generic drug: Insulin Syringe-Needle U-100 USE TO INJECT insulin EVERY DAY   Centrum Silver 50+Women Tabs Take 1 tablet by mouth daily.   darifenacin 15 MG 24 hr tablet Commonly known as: ENABLEX TAKE 1 TABLET BY MOUTH EVERY DAY   gabapentin 100 MG capsule Commonly known as: NEURONTIN TAKE 1 CAPSULE BY MOUTH AT BEDTIME   Iron 325 (65 Fe) MG Tabs Take 1 tablet (325 mg total) by  mouth daily.   Levemir FlexTouch 100 UNIT/ML Pen Generic drug: Insulin Detemir Inject 52 Units into the skin at bedtime.   losartan 25 MG tablet Commonly known as: COZAAR Take 1 tablet (25 mg total) by mouth every evening.  memantine 10 MG tablet Commonly known as: NAMENDA Take 10 mg by mouth 2 (two) times daily.   metFORMIN 1000 MG tablet Commonly known as: GLUCOPHAGE Take 1,000 mg by mouth 2 (two) times daily.   metFORMIN 500 MG 24 hr tablet Commonly known as: GLUCOPHAGE-XR TAKE 1 TABLET BY MOUTH EVERY MORNING WITH A MEAL   ONE TOUCH ULTRA TEST test strip Generic drug: glucose blood USE TO CHECK BLOOD SUGAR EVERY DAY   pantoprazole 40 MG tablet Commonly known as: PROTONIX Take 1 tablet (40 mg total) by mouth 2 (two) times daily before a meal.   PreviDent 5000 Dry Mouth 1.1 % Gel dental gel Generic drug: sodium fluoride Place 1 application onto teeth at bedtime as needed (for teeth).   sertraline 100 MG tablet Commonly known as: ZOLOFT TAKE 1 TABLET BY MOUTH EVERY DAY AND TAKE 1/2 TABLET AT BEDTIME   tiZANidine 2 MG tablet Commonly known as: ZANAFLEX Take 2-4 mg by mouth 2 (two) times daily as needed.   Ultram 50 MG tablet Generic drug: traMADol Take 1 tablet (50 mg total) by mouth every 12 (twelve) hours as needed.           Objective:   Physical Exam Abdominal:      BP (!) 145/59 (BP Location: Right Arm, Patient Position: Sitting, Cuff Size: Small)   Pulse (!) 57   Temp (!) 97.3 F (36.3 C) (Temporal)   Resp 16   Ht 5\' 6"  (1.676 m)   Wt 177 lb 2 oz (80.3 kg)   SpO2 98%   BMI 28.59 kg/m  General:   Well developed, NAD, BMI noted. HEENT:  Normocephalic . Face symmetric, atraumatic  Neurologic:  alert & oriented X3.  Speech normal, gait appropriate for age and unassisted Psych--  Cognition and judgment appear intact.  Cooperative with normal attention span and concentration.  Behavior appropriate. No anxious or depressed appearing.       Assessment     Assessment  DM Dr. Buddy Duty HTN Hyperlipidemia H/o SIADH Depression used to see Dr. Clovis Pu Mild cognitive impairment  MMSE 2015 --> 28, on Aricept, namenda added 04-2016 sees Dr Everette Rank  OSA -- mild, also told RLS (Rx requip); saw Dr Gwenette Greet 2014, no CPAP GI: --IBS, colon polyps, diverticulosis, hiatal hernia --Chronic constipation likely part of her IBS syndrome  --Fatty liver >>> GI eval 2013 --iron fec anemia:  cscope 04-2014 : 2 polyps; + hemocult @ GI office 10-2014: EGD done (-), bx neg Osteopenia: T score -1.1  2009 , osteopenia again per dexa 05-2013, t score -1.8 (10-09-17), scanned, rx ca-vit d B12 deficiency  RLS MSK: --DJD frozen shoulder Dizziness: chronic, carotid US 2016 neg, saw cards-- not likely CV related; saw neuro DR Everette Rank 02-2015  Chest pain 09-2009, negative stress test Thyroid nodules: Incidental   by Carotid US, BX 11-2014: Atypical findings, [facility] III, f/u  Endocrine RLL Lung nodule: Stable per CT 09-2016, no need for further B/B incontinence:  uses diapers   PLAN: Tick bite: Removed by the patient, recommend hydrocortisone OTC topically, call if not better. Fatigue, abdominal pain: Went to the ER, work-up essentially negative, has no complaints today. L UTS: Reports no symptoms today DM, thyroid disease: Last A1c and TSH satisfactory. Preventive care: Recommend flu shot next month RTC 01-2019 CPX

## 2018-08-26 NOTE — Progress Notes (Signed)
Pre visit review using our clinic review tool, if applicable. No additional management support is needed unless otherwise documented below in the visit note. 

## 2018-08-26 NOTE — Patient Instructions (Addendum)
   GO TO THE FRONT DESK Schedule your next appointment for a physical exam by 01/2019    Get a flu shot next month  Apply hydrocortisone 1% to the tick bite 2 times a day, call if no better

## 2018-08-27 NOTE — Assessment & Plan Note (Signed)
Tick bite: Removed by the patient, recommend hydrocortisone OTC topically, call if not better. Fatigue, abdominal pain: Went to the ER, work-up essentially negative, has no complaints today. L UTS: Reports no symptoms today DM, thyroid disease: Last A1c and TSH satisfactory. Preventive care: Recommend flu shot next month RTC 01-2019 CPX

## 2018-09-03 ENCOUNTER — Other Ambulatory Visit: Payer: Self-pay

## 2018-09-03 ENCOUNTER — Ambulatory Visit (INDEPENDENT_AMBULATORY_CARE_PROVIDER_SITE_OTHER): Payer: Medicare Other | Admitting: Orthotics

## 2018-09-03 DIAGNOSIS — E118 Type 2 diabetes mellitus with unspecified complications: Secondary | ICD-10-CM

## 2018-09-03 DIAGNOSIS — L03031 Cellulitis of right toe: Secondary | ICD-10-CM | POA: Diagnosis not present

## 2018-09-03 DIAGNOSIS — L02611 Cutaneous abscess of right foot: Secondary | ICD-10-CM | POA: Diagnosis not present

## 2018-09-03 DIAGNOSIS — Q828 Other specified congenital malformations of skin: Secondary | ICD-10-CM

## 2018-09-03 NOTE — Progress Notes (Signed)

## 2018-09-29 ENCOUNTER — Encounter: Payer: Self-pay | Admitting: Podiatry

## 2018-09-29 ENCOUNTER — Ambulatory Visit (INDEPENDENT_AMBULATORY_CARE_PROVIDER_SITE_OTHER): Payer: Medicare Other | Admitting: Gastroenterology

## 2018-09-29 ENCOUNTER — Other Ambulatory Visit: Payer: Self-pay

## 2018-09-29 ENCOUNTER — Ambulatory Visit (INDEPENDENT_AMBULATORY_CARE_PROVIDER_SITE_OTHER): Payer: Medicare Other | Admitting: Podiatry

## 2018-09-29 ENCOUNTER — Encounter: Payer: Self-pay | Admitting: Gastroenterology

## 2018-09-29 VITALS — BP 120/70 | HR 72 | Temp 98.1°F | Ht 66.0 in | Wt 174.0 lb

## 2018-09-29 DIAGNOSIS — R195 Other fecal abnormalities: Secondary | ICD-10-CM

## 2018-09-29 DIAGNOSIS — Z800 Family history of malignant neoplasm of digestive organs: Secondary | ICD-10-CM

## 2018-09-29 DIAGNOSIS — B351 Tinea unguium: Secondary | ICD-10-CM

## 2018-09-29 DIAGNOSIS — K219 Gastro-esophageal reflux disease without esophagitis: Secondary | ICD-10-CM

## 2018-09-29 DIAGNOSIS — E118 Type 2 diabetes mellitus with unspecified complications: Secondary | ICD-10-CM

## 2018-09-29 DIAGNOSIS — Q828 Other specified congenital malformations of skin: Secondary | ICD-10-CM

## 2018-09-29 DIAGNOSIS — M79676 Pain in unspecified toe(s): Secondary | ICD-10-CM

## 2018-09-29 NOTE — Progress Notes (Signed)
She presents today chief complaint of painfully elongated toenails with corns and calluses.  Objective: Pulses are palpable.  Toenails are long thick yellow dystrophic with mycotic sharply incurvated.  Reactive hyperkeratotic lesions along the medial aspect of the first metatarsophalangeal joint plantar aspect of the bilateral foot.  Assessment: Pain in limb secondary onychomycosis porokeratosis.  Plan: Debridement of all reactive hyperkeratotic tissue debridement of toenails 1 through 5 bilateral.

## 2018-09-29 NOTE — Patient Instructions (Signed)
Iron can be the cause of your dark stools. Please discuss with your primary care physician if you need to continue your iron supplement.   Start a food diary to see what can be causing your diarrhea. Keep track of high fat foods, dairy products and raw fruits and vegetables.  Thank you for choosing me and [facility].  Pricilla Riffle. Dagoberto Ligas., MD., Marval Regal

## 2018-09-29 NOTE — Progress Notes (Signed)
    History of Present Illness: This is a 73 year old female complaining of intermittent dark stools and intermittent loose stools.  Pts daughter accompanies her on the phone for the visit, audio only. She was evaluated here by telemedicine visit on May 19 for mild diarrhea.  GI pathogen panel and fecal elastase both normal.  She was [facility] August 11 complaining of fatigue and intermittent black stools.  ED evaluation included heme negative stool, hemoglobin=12.4, CT AP unremarkable as below. She describes 2 BMs/week with that are occasionally dark brown to black. No tarry, sticky stools. She takes iron. Most of the time her stools are soft, sometimes loose. No clear dietary stressors.   CT AP 08/18/2018 IMPRESSION: 1. No acute intra-abdominal or pelvic pathology. No bowel obstruction or active inflammation. Normal appendix. 2. Small sigmoid diverticula.  No acute inflammation. 3. Umbilical hernia containing fat.  4. Aortic Atherosclerosis (ICD10-I70.0).  EGD 08/2015 - Normal esophagus. - Normal stomach. - Normal examined duodenum. - No specimens collected.  Colonoscopy 04/2014 1. Two sessile polyps in the descending colon; polypectomies performed with a cold snare 2. Grade l internal hemorrhoids   Current Medications, Allergies, Past Medical History, Past Surgical History, Family History and Social History were reviewed in [employer] record.   Physical Exam: General: Well developed, well nourished, no acute distress Head: Normocephalic and atraumatic Eyes:  sclerae anicteric, EOMI Ears: Normal auditory acuity Mouth: No deformity or lesions Lungs: Clear throughout to auscultation Heart: Regular rate and rhythm; no murmurs, rubs or bruits Abdomen: Soft, non tender and non distended. No masses, hepatosplenomegaly or hernias noted. Normal Bowel sounds Rectal: No lesions, heme negative stool Musculoskeletal: Symmetrical with no gross deformities   Pulses:  Normal pulses noted Extremities: No clubbing, cyanosis, edema or deformities noted Neurological: Alert oriented x 4, memory deficits Psychological:  Alert and cooperative. Normal mood and affect   Assessment and Recommendations:  1.  Intermittent dark stools.  ED evaluation and exam today both showed heme-negative brown stool. Iron could be the cause.  As her anemia is corrected I suggested she contact her PCP to see if remaining on iron is necessary.    2.  Intermittent loose stools.  2 soft BMs per week and sometimes stools loose.  I asked her to keep a diet log.  Milk products, artificial sweeteners, high fat foods, raw foods, raw vegetables are potential causes. Metformin is a potential cause. Send tTG , IgA.   3. History of iron deficiency anemia. Recent hemoglobin is normal at 12.4. If iron deficiency is recurrent consider repeat endoscopic evaluation and VCE.  4. GERD.  Follow antireflux measures and continue pantoprazole 40 mg twice daily.  5.  Family history of colon cancer and personal history of adenomatous colon polyps.  A 5-year interval colonoscopy is recommended in April 2021.

## 2018-09-30 ENCOUNTER — Ambulatory Visit (INDEPENDENT_AMBULATORY_CARE_PROVIDER_SITE_OTHER): Payer: Medicare Other | Admitting: *Deleted

## 2018-09-30 ENCOUNTER — Other Ambulatory Visit: Payer: Self-pay

## 2018-09-30 DIAGNOSIS — Z23 Encounter for immunization: Secondary | ICD-10-CM | POA: Diagnosis not present

## 2018-09-30 NOTE — Progress Notes (Signed)
Patient here today for flu vaccine.  Vaccine given and patient tolerated well. 

## 2018-10-01 ENCOUNTER — Encounter: Payer: Self-pay | Admitting: Internal Medicine

## 2018-10-07 ENCOUNTER — Telehealth: Payer: Self-pay | Admitting: Gastroenterology

## 2018-10-07 NOTE — Telephone Encounter (Signed)
Pls call pt, she has some question about a stool test that she would like to have.

## 2018-10-07 NOTE — Telephone Encounter (Signed)
Spoke to the patient who reports 2 episodes of "black stools" the past week. The patient has been on a daily ferrous sulfate for a while. No Pepto-Bismol. Denies weakness, lightheadedness, or SOB but does endorse intermittent epigastric pain which she takes Tums for relief. The patient has collected her stool and refrigerated it and is requesting for it to be tested for blood. Please advise.

## 2018-10-07 NOTE — Telephone Encounter (Signed)
Spoke to patient, review hemoccult instructions. Placed lab test at front desk for the patient to pick up.

## 2018-10-07 NOTE — Telephone Encounter (Signed)
Please mail her, or have her pick up, stool hemoccult cards. I don't know if her saved sample is adequate or if a fresh sample is needed please check with the lab staff.

## 2018-10-07 NOTE — Telephone Encounter (Signed)
Pt called back to let nurse know that she gave some incorrect information. She states she collect a stool sample that did no go underwater she used a clean spoon and clean plastic bag to hold sample. She states it is in her refrigerator and she can bring it in today if needed. Please advise

## 2018-10-09 ENCOUNTER — Telehealth: Payer: Self-pay

## 2018-10-09 NOTE — Telephone Encounter (Signed)
ST Plan of Care signed and faxed to [facility] at [phone]. Form sent for scanning.

## 2018-10-28 ENCOUNTER — Ambulatory Visit: Payer: Medicare Other | Admitting: Internal Medicine

## 2018-11-02 ENCOUNTER — Other Ambulatory Visit: Payer: Self-pay | Admitting: Internal Medicine

## 2018-11-04 NOTE — Progress Notes (Signed)
Virtual Visit via Video Note  I connected with patient on 11/05/18 at  2:30 PM EDT by audio enabled telemedicine application and verified that I am speaking with the correct person using two identifiers.   THIS ENCOUNTER IS A VIRTUAL VISIT DUE TO COVID-19 - PATIENT WAS NOT SEEN IN THE OFFICE. PATIENT HAS CONSENTED TO VIRTUAL VISIT / TELEMEDICINE VISIT   Location of patient: home  Location of provider: office  I discussed the limitations of evaluation and management by telemedicine and the availability of in person appointments. The patient expressed understanding and agreed to proceed.   Subjective:   Shameria Truby Krahn is a 73 y.o. female who presents for Medicare Annual (Subsequent) preventive examination.  Review of Systems:  Home Safety/Smoke Alarms: Feels safe in home. Smoke alarms in place.  Lives at [facility] in apt. Uses elevator. Emergency pull cords throughout apt.    Female:     Mammo- 11/05/17       Dexa scan- 11/05/17        CCS- last 04/15/14. 5 yr recall    Objective:     Vitals: Unable to assess. This visit is enabled though telemedicine due to Covid 19.   Advanced Directives 11/05/2018 03/09/2018 06/23/2017 01/13/2017 08/11/2016 02/29/2016 01/12/2016  Does Patient Have a Medical Advance Directive? Yes Yes No Yes No Yes Yes  Type of Paramedic of [place];Living will [place];Living will - [place] will [place];Living will  Does patient want to make changes to medical advance directive? No - Patient declined - - No - Patient declined - - No - Patient declined  Copy of [place] in Chart? Yes - validated most recent copy scanned in chart (See row information) No - copy requested - Yes - - No - copy requested  Would patient like information on creating a medical advance directive? - - No - Patient declined - - No - Patient declined -    Tobacco Social History    Tobacco Use  Smoking Status Former Smoker  . Packs/day: 2.00  . Years: 10.00  . Pack years: 20.00  . Types: Cigarettes  . Quit date: 11/26/1975  . Years since quitting: 42.9  Smokeless Tobacco Never Used  Tobacco Comment   used to smoke 2 ppd     Counseling given: Not Answered Comment: used to smoke 2 ppd   Clinical Intake:  Pain : No/denies pain     Past Medical History:  Diagnosis Date  . Adenomatous colon polyp 03/1988  . Anemia    borderline  . Arthritis    R shoulder - degenerative   . Cataract    beginning stages  . Chest pain     Sept 2011: stress test neg  . Depression    sees Dr.Cotle  . Diabetes mellitus    dr Buddy Duty  . Diverticulosis   . Fatty liver    Increased LFTs, saw GI 06/2011, likely from fatty liver   . GERD (gastroesophageal reflux disease)   . History of hiatal hernia   . Hyperlipemia   . Hypertension   . IBS (irritable bowel syndrome)    and dyspepsia  . Memory impairment 08/2014   mild cognitive impairment vs. mild dementia, reommended reinstating of cholinesterase inhibitor   . Osteopenia    dexa 06/2007 and 10/11 Rx CA vitamin d  . RLS (restless legs syndrome)    h/o- no longer using Requip  Past Surgical History:  Procedure Laterality Date  . BREAST BIOPSY Left 10/11/2015   fibroadenoma w/ calcifications, no evidence of malignancy, recommend mammogram repeat-6 mnths  . POLYPECTOMY    . REVERSE SHOULDER ARTHROPLASTY Right 04/21/2014   Procedure: REVERSE SHOULDER ARTHROPLASTY;  Surgeon: Tania Ade, MD;  Location: [facility];  Service: Orthopedics;  Laterality: Right;  Right reverse total shoulder replacement  . TONSILLECTOMY    . UTERINE FIBROID EMBOLIZATION     90s   Family History  Problem Relation Age of Onset  . Lung cancer Mother        smoker  . Colon cancer Father        F dx in his 52s  . Ovarian cancer Paternal Aunt        ?  . Diabetes Other        aunts-uncles   . Stroke Other        aunts-uncles   . Heart  attack Maternal Grandmother        60s  . Other Other        niece- glioblastoma  . Breast cancer Neg Hx   . Rectal cancer Neg Hx   . Stomach cancer Neg Hx   . Esophageal cancer Neg Hx    Social History   Socioeconomic History  . Marital status: Widowed    Spouse name: [name]  . Number of children: 3  . Years of education: Not on file  . Highest education level: Not on file  Occupational History  . Occupation: retired, was a Leisure centre manager)    Comment: RN  Social Needs  . Financial resource strain: Not on file  . Food insecurity    Worry: Not on file    Inability: Not on file  . Transportation needs    Medical: Not on file    Non-medical: Not on file  Tobacco Use  . Smoking status: Former Smoker    Packs/day: 2.00    Years: 10.00    Pack years: 20.00    Types: Cigarettes    Quit date: 11/26/1975    Years since quitting: 42.9  . Smokeless tobacco: Never Used  . Tobacco comment: used to smoke 2 ppd  Substance and Sexual Activity  . Alcohol use: Yes    Alcohol/week: 0.0 standard drinks    Comment: rarely  . Drug use: No  . Sexual activity: Not Currently  Lifestyle  . Physical activity    Days per week: Not on file    Minutes per session: Not on file  . Stress: Not on file  Relationships  . Social Herbalist on phone: Not on file    Gets together: Not on file    Attends religious service: Not on file    Active member of club or organization: Not on file    Attends meetings of clubs or organizations: Not on file    Relationship status: Not on file  Other Topics Concern  . Not on file  Social History Narrative   Widow , lives by herself at [facility] in a 1 bedroom apt;  still drives, daughters [name]) live  in [place] in [place]     5 g-children    Outpatient Encounter Medications as of 11/05/2018  Medication Sig  . ACCU-CHEK FASTCLIX LANCETS MISC daily. use as directed  . acetaminophen (TYLENOL) 325 MG tablet Take 650 mg by  mouth every 6 (six) hours as needed for mild pain or headache.  Marland Kitchen  aspirin (ASPIRIN LOW DOSE) 81 MG EC tablet Take 1 tablet (81 mg total) by mouth daily.  Marland Kitchen atenolol (TENORMIN) 50 MG tablet Take 25 mg by mouth daily.  Marland Kitchen atorvastatin (LIPITOR) 40 MG tablet Take 1 tablet (40 mg total) by mouth daily.  Marland Kitchen azelastine (ASTELIN) 0.1 % nasal spray Place 2 sprays into both nostrils at bedtime as needed for rhinitis. Use in each nostril as directed  . BD VEO INSULIN SYRINGE U/F 31G X 15/64" 1 ML MISC USE TO INJECT insulin EVERY DAY  . Cyanocobalamin (B-12) 1000 MCG TBCR Take 1 tablet by mouth daily.  Marland Kitchen darifenacin (ENABLEX) 15 MG 24 hr tablet Take 1 tablet (15 mg total) by mouth daily.  Marland Kitchen donepezil (ARICEPT) 10 MG tablet Take 2 tablets (20 mg total) by mouth at bedtime.  . Ferrous Sulfate (IRON) 325 (65 Fe) MG TABS Take 1 tablet (325 mg total) by mouth daily.  Marland Kitchen gabapentin (NEURONTIN) 100 MG capsule TAKE 1 CAPSULE BY MOUTH AT BEDTIME  . Insulin Detemir (LEVEMIR FLEXTOUCH) 100 UNIT/ML Pen Inject 52 Units into the skin at bedtime.   Marland Kitchen losartan (COZAAR) 25 MG tablet Take 1 tablet (25 mg total) by mouth every evening.  . memantine (NAMENDA) 10 MG tablet Take 10 mg by mouth 2 (two) times daily.  . metFORMIN (GLUCOPHAGE-XR) 500 MG 24 hr tablet TAKE 1 TABLET BY MOUTH EVERY MORNING WITH A MEAL  . Multiple Vitamins-Minerals (CENTRUM SILVER 50+WOMEN) TABS Take 1 tablet by mouth daily.  . ONE TOUCH ULTRA TEST test strip USE TO CHECK BLOOD SUGAR EVERY DAY  . pantoprazole (PROTONIX) 40 MG tablet Take 1 tablet (40 mg total) by mouth 2 (two) times daily before a meal.  . sertraline (ZOLOFT) 100 MG tablet TAKE 1 TABLET BY MOUTH EVERY DAY AND TAKE 1/2 TABLET AT BEDTIME  . sodium fluoride (PREVIDENT 5000 DRY MOUTH) 1.1 % GEL dental gel Place 1 application onto teeth at bedtime as needed (for teeth).   Marland Kitchen tiZANidine (ZANAFLEX) 2 MG tablet Take 2-4 mg by mouth 2 (two) times daily as needed.  . traMADol (ULTRAM) 50 MG tablet  Take 1 tablet (50 mg total) by mouth every 12 (twelve) hours as needed.  . [DISCONTINUED] metFORMIN (GLUCOPHAGE) 1000 MG tablet Take 1,000 mg by mouth 2 (two) times daily.   No facility-administered encounter medications on file as of 11/05/2018.     Activities of Daily Living In your present state of health, do you have any difficulty performing the following activities: 01/21/2018  Hearing? N  Vision? N  Difficulty concentrating or making decisions? Y  Walking or climbing stairs? N  Dressing or bathing? N  Doing errands, shopping? Y  Some recent data might be hidden    Patient Care Team: Colon Branch, MD as PCP - General Milinda Pointer, Max T, DPM as Consulting Physician (Podiatry) Karl Luke, MD as Referring Physician (Neurology) Delrae Rend, MD as Consulting Physician (Endocrinology) Macarthur Critchley, [place] as Referring Physician (Optometry) Norma Fredrickson, MD as Consulting Physician (Psychiatry) Aloha Gell, MD as Consulting Physician (Obstetrics and Gynecology)    Assessment:   This is a routine wellness examination for Shazia. Physical assessment deferred to PCP.  Exercise Activities and Dietary recommendations   Diet (meal preparation, eat out, water intake, caffeinated beverages, dairy products, fruits and vegetables): in general, a "healthy" diet  , well balanced   Goals    . Begin doing silver sneakers 3x/week.    . Increase physical activity    . Start bible  study group.       Fall Risk Fall Risk  11/05/2018 01/21/2018 01/13/2017 01/12/2016 09/18/2015  Falls in the past year? 1 1 Yes No No  Number falls in past yr: 0 1 1 - -  Injury with Fall? 0 0 No - -  Risk for fall due to : - - (No Data) - -  Risk for fall due to: Comment - - pt states she was dx with L foot drop - -  Follow up - - Education provided;Falls prevention discussed - -     Depression Screen PHQ 2/9 Scores 11/05/2018 01/21/2018 01/13/2017 09/11/2016  PHQ - 2 Score 0 2 0 0  PHQ- 9 Score - 8 - 0       Cognitive Function Ad8 score reviewed for issues:  Issues making decisions:no  Less interest in hobbies / activities:no  Repeats questions, stories (family complaining):no  Trouble using ordinary gadgets (microwave, computer, phone):no  Forgets the month or year: no  Mismanaging finances: no  Remembering appts:no  Daily problems with thinking and/or memory:no Ad8 score is=     MMSE - Mini Mental State Exam 01/13/2017 01/12/2016  Orientation to time 5 5  Orientation to Place 5 5  Registration 3 3  Attention/ Calculation 5 5  Recall 2 3  Language- name 2 objects 2 2  Language- repeat 1 1  Language- follow 3 step command 3 3  Language- read & follow direction 1 1  Write a sentence 1 1  Copy design 1 1  Total score 29 30        Immunization History  Administered Date(s) Administered  . Fluad Quad(high Dose 65+) 09/30/2018  . Influenza Split 11/26/2010, 11/28/2011, 10/26/2013  . Influenza, High Dose Seasonal PF 09/21/2014, 09/18/2015, 10/09/2016, 09/27/2017  . Influenza,inj,Quad PF,6+ Mos 10/27/2012  . Pneumococcal Conjugate-13 12/24/2013  . Pneumococcal Polysaccharide-23 06/09/2007, 11/30/2012  . Tetanus 11/30/2012  . Zoster 03/24/2008  . Zoster Recombinat (Shingrix) 11/07/2017    Screening Tests Health Maintenance  Topic Date Due  . OPHTHALMOLOGY EXAM  10/25/2018  . MAMMOGRAM  11/06/2018  . FOOT EXAM  11/15/2018  . HEMOGLOBIN A1C  02/10/2019  . COLONOSCOPY  04/15/2019  . TETANUS/TDAP  12/01/2022  . INFLUENZA VACCINE  Completed  . DEXA SCAN  Completed  . Hepatitis C Screening  Completed  . PNA vac Low Risk Adult  Completed       Plan:    Please schedule your next medicare wellness visit with me in 1 yr.  Continue to eat heart healthy diet (full of fruits, vegetables, whole grains, lean protein, water--limit salt, fat, and sugar intake) and increase physical activity as tolerated.  Continue doing brain stimulating activities (puzzles, reading, adult  coloring books, staying active) to keep memory sharp.      I have personally reviewed and noted the following in the patient's chart:   . Medical and social history . Use of alcohol, tobacco or illicit drugs  . Current medications and supplements . Functional ability and status . Nutritional status . Physical activity . Advanced directives . List of other physicians . Hospitalizations, surgeries, and ER visits in previous 12 months . Vitals . Screenings to include cognitive, depression, and falls . Referrals and appointments  In addition, I have reviewed and discussed with patient certain preventive protocols, quality metrics, and best practice recommendations. A written personalized care plan for preventive services as well as general preventive health recommendations were provided to patient.     Shela Nevin,  RN  11/05/2018

## 2018-11-05 ENCOUNTER — Other Ambulatory Visit: Payer: Self-pay

## 2018-11-05 ENCOUNTER — Ambulatory Visit (INDEPENDENT_AMBULATORY_CARE_PROVIDER_SITE_OTHER): Payer: Medicare Other | Admitting: *Deleted

## 2018-11-05 ENCOUNTER — Encounter: Payer: Self-pay | Admitting: *Deleted

## 2018-11-05 DIAGNOSIS — Z0000 Encounter for general adult medical examination without abnormal findings: Secondary | ICD-10-CM | POA: Diagnosis not present

## 2018-11-05 NOTE — Patient Instructions (Signed)
Please schedule your next medicare wellness visit with me in 1 yr.  Continue to eat heart healthy diet (full of fruits, vegetables, whole grains, lean protein, water--limit salt, fat, and sugar intake) and increase physical activity as tolerated.  Continue doing brain stimulating activities (puzzles, reading, adult coloring books, staying active) to keep memory sharp.    [name] , Thank you for taking time to come for your Medicare Wellness Visit. I appreciate your ongoing commitment to your health goals. Please review the following plan we discussed and let me know if I can assist you in the future.   These are the goals we discussed: Goals    . Increase physical activity    . Start bible study group.       This is a list of the screening recommended for you and due dates:  Health Maintenance  Topic Date Due  . Eye exam for diabetics  10/25/2018  . Mammogram  11/06/2018  . Complete foot exam   11/15/2018  . Hemoglobin A1C  02/10/2019  . Colon Cancer Screening  04/15/2019  . Tetanus Vaccine  12/01/2022  . Flu Shot  Completed  . DEXA scan (bone density measurement)  Completed  .  Hepatitis C: One time screening is recommended by Center for Disease Control  (CDC) for  adults born from [birth year] through 1965.   Completed  . Pneumonia vaccines  Completed    Health Maintenance After Age 26 After age 40, you are at a higher risk for certain long-term diseases and infections as well as injuries from falls. Falls are a major cause of broken bones and head injuries in people who are older than age 59. Getting regular preventive care can help to keep you healthy and well. Preventive care includes getting regular testing and making lifestyle changes as recommended by your health care provider. Talk with your health care provider about:  Which screenings and tests you should have. A screening is a test that checks for a disease when you have no symptoms.  A diet and exercise plan that is  right for you. What should I know about screenings and tests to prevent falls? Screening and testing are the best ways to find a health problem early. Early diagnosis and treatment give you the best chance of managing medical conditions that are common after age 27. Certain conditions and lifestyle choices may make you more likely to have a fall. Your health care provider may recommend:  Regular vision checks. Poor vision and conditions such as cataracts can make you more likely to have a fall. If you wear glasses, make sure to get your prescription updated if your vision changes.  Medicine review. Work with your health care provider to regularly review all of the medicines you are taking, including over-the-counter medicines. Ask your health care provider about any side effects that may make you more likely to have a fall. Tell your health care provider if any medicines that you take make you feel dizzy or sleepy.  Osteoporosis screening. Osteoporosis is a condition that causes the bones to get weaker. This can make the bones weak and cause them to break more easily.  Blood pressure screening. Blood pressure changes and medicines to control blood pressure can make you feel dizzy.  Strength and balance checks. Your health care provider may recommend certain tests to check your strength and balance while standing, walking, or changing positions.  Foot health exam. Foot pain and numbness, as well as not wearing proper  footwear, can make you more likely to have a fall.  Depression screening. You may be more likely to have a fall if you have a fear of falling, feel emotionally low, or feel unable to do activities that you used to do.  Alcohol use screening. Using too much alcohol can affect your balance and may make you more likely to have a fall. What actions can I take to lower my risk of falls? General instructions  Talk with your health care provider about your risks for falling. Tell your  health care provider if: ? You fall. Be sure to tell your health care provider about all falls, even ones that seem minor. ? You feel dizzy, sleepy, or off-balance.  Take over-the-counter and prescription medicines only as told by your health care provider. These include any supplements.  Eat a healthy diet and maintain a healthy weight. A healthy diet includes low-fat dairy products, low-fat (lean) meats, and fiber from whole grains, beans, and lots of fruits and vegetables. Home safety  Remove any tripping hazards, such as rugs, cords, and clutter.  Install safety equipment such as grab bars in bathrooms and safety rails on stairs.  Keep rooms and walkways well-lit. Activity   Follow a regular exercise program to stay fit. This will help you maintain your balance. Ask your health care provider what types of exercise are appropriate for you.  If you need a cane or walker, use it as recommended by your health care provider.  Wear supportive shoes that have nonskid soles. Lifestyle  Do not drink alcohol if your health care provider tells you not to drink.  If you drink alcohol, limit how much you have: ? 0-1 drink a day for women. ? 0-2 drinks a day for men.  Be aware of how much alcohol is in your drink. In the [place] one drink equals one typical bottle of beer (12 oz), one-half glass of wine (5 oz), or one shot of hard liquor (1 oz).  Do not use any products that contain nicotine or tobacco, such as cigarettes and e-cigarettes. If you need help quitting, ask your health care provider. Summary  Having a healthy lifestyle and getting preventive care can help to protect your health and wellness after age 18.  Screening and testing are the best way to find a health problem early and help you avoid having a fall. Early diagnosis and treatment give you the best chance for managing medical conditions that are more common for people who are older than age 58.  Falls are a major cause  of broken bones and head injuries in people who are older than age 63. Take precautions to prevent a fall at home.  Work with your health care provider to learn what changes you can make to improve your health and wellness and to prevent falls. This information is not intended to replace advice given to you by your health care provider. Make sure you discuss any questions you have with your health care provider. Document Released: 11/06/2016 Document Revised: 04/16/2018 Document Reviewed: 11/06/2016 Elsevier Patient Education  2020 [employer].

## 2018-11-11 LAB — HM MAMMOGRAPHY

## 2018-11-16 ENCOUNTER — Encounter: Payer: Self-pay | Admitting: Internal Medicine

## 2018-11-18 ENCOUNTER — Telehealth: Payer: Self-pay

## 2018-11-18 NOTE — Telephone Encounter (Signed)
Spoke w/ [name] informed of recommendations. Pt verbalized understanding.  

## 2018-11-18 NOTE — Telephone Encounter (Signed)
Okay to take Zyrtec as needed, do watch for excessive sedation (feeling sleepy)

## 2018-11-18 NOTE — Telephone Encounter (Signed)
Copied from [place] [id number]. Topic: Quick Communication - See Telephone Encounter >> Nov 18, 2018  3:54 PM Loma Boston wrote: CRM for notification. See Telephone encounter for: 11/18/18. Pt has some zyterc and is wanting to know if she can take this with her other meds, Specfically wants Dr Larose Kells opinion.... The label on the box says to check with your dr. [phone]

## 2018-11-19 ENCOUNTER — Other Ambulatory Visit: Payer: Self-pay | Admitting: Internal Medicine

## 2018-12-01 ENCOUNTER — Telehealth: Payer: Self-pay | Admitting: Podiatry

## 2018-12-01 ENCOUNTER — Ambulatory Visit: Payer: Medicare Other | Admitting: Podiatry

## 2018-12-01 NOTE — Telephone Encounter (Signed)
I'm calling to get a medical records release form e-mailed to me to fill out and sign for my mother, who I  power of attorney over. We are going to get her switched to a foot clinic that is located in the retirement home she is in. Please e-mail that form to me at [email or web address].

## 2018-12-29 ENCOUNTER — Other Ambulatory Visit: Payer: Self-pay | Admitting: Internal Medicine

## 2019-01-05 ENCOUNTER — Ambulatory Visit: Payer: Medicare Other | Admitting: Podiatry

## 2019-01-05 ENCOUNTER — Encounter: Payer: Self-pay | Admitting: Medical

## 2019-01-12 ENCOUNTER — Telehealth: Payer: Self-pay | Admitting: Internal Medicine

## 2019-01-12 NOTE — Telephone Encounter (Signed)
Copied from [place] [id number]. Topic: General - Other >> Jan 12, 2019 12:08 PM Celene Kras wrote: Reason for CRM: Pt called stating she has received the covid vaccine yesterday. Pt states she is in a lot of pain and is requesting to know if she can have something sent in. Pt states she has been taking ibprofen, tylenol, and tramadol and the pain is not going away. Please advise.    [facility] - [place], [place] - [address] [place] [address] Phone: [phone] Fax: [phone] Not a 24 hour pharmacy; exact hours not known.

## 2019-01-15 NOTE — Telephone Encounter (Signed)
Routed to wrong pool

## 2019-01-18 NOTE — Telephone Encounter (Signed)
Spoke with pt. Apologized for our delay in getting back to her but the message did not get routed to the correct office ([place]) until yesterday. She states that symptoms she previously reported were better the next day after taking tylenol. Has no concerns at this time.

## 2019-01-26 ENCOUNTER — Other Ambulatory Visit: Payer: Self-pay | Admitting: Internal Medicine

## 2019-01-27 ENCOUNTER — Ambulatory Visit (HOSPITAL_BASED_OUTPATIENT_CLINIC_OR_DEPARTMENT_OTHER)
Admission: RE | Admit: 2019-01-27 | Discharge: 2019-01-27 | Disposition: A | Discharge status: Home / Self Care | Payer: Medicare Other | Source: Ambulatory Visit | Attending: Internal Medicine | Admitting: Internal Medicine

## 2019-01-27 ENCOUNTER — Other Ambulatory Visit: Payer: Self-pay

## 2019-01-27 ENCOUNTER — Ambulatory Visit (INDEPENDENT_AMBULATORY_CARE_PROVIDER_SITE_OTHER): Payer: Medicare Other | Admitting: Internal Medicine

## 2019-01-27 ENCOUNTER — Encounter: Payer: Self-pay | Admitting: Internal Medicine

## 2019-01-27 VITALS — BP 139/70 | HR 54 | Temp 96.0°F | Resp 16 | Ht 66.0 in | Wt 182.2 lb

## 2019-01-27 DIAGNOSIS — M795 Residual foreign body in soft tissue: Secondary | ICD-10-CM | POA: Diagnosis not present

## 2019-01-27 DIAGNOSIS — Z0000 Encounter for general adult medical examination without abnormal findings: Secondary | ICD-10-CM | POA: Diagnosis not present

## 2019-01-27 LAB — COMPREHENSIVE METABOLIC PANEL
ALT: 18 U/L (ref 0–35)
AST: 20 U/L (ref 0–37)
Albumin: 4.5 g/dL (ref 3.5–5.2)
Alkaline Phosphatase: 66 U/L (ref 39–117)
BUN: 10 mg/dL (ref 6–23)
CO2: 29 mEq/L (ref 19–32)
Calcium: 9.3 mg/dL (ref 8.4–10.5)
Chloride: 100 mEq/L (ref 96–112)
Creatinine, Ser: 0.74 mg/dL (ref 0.40–1.20)
GFR: 76.73 mL/min (ref >60.00)
Glucose, Bld: 89 mg/dL (ref 70–99)
Potassium: 3.7 mEq/L (ref 3.5–5.1)
Sodium: 136 mEq/L (ref 135–145)
Total Bilirubin: 0.8 mg/dL (ref 0.2–1.2)
Total Protein: 7 g/dL (ref 6.0–8.3)

## 2019-01-27 LAB — LIPID PANEL
Cholesterol: 138 mg/dL (ref 0–200)
HDL: 49.4 mg/dL (ref >39.00)
NonHDL: 88.26
Total CHOL/HDL Ratio: 3
Triglycerides: 237 mg/dL — ABNORMAL HIGH (ref 0.0–149.0)
VLDL: 47.4 mg/dL — ABNORMAL HIGH (ref 0.0–40.0)

## 2019-01-27 LAB — LDL CHOLESTEROL, DIRECT: Direct LDL: 61 mg/dL

## 2019-01-27 NOTE — Progress Notes (Signed)
Subjective:    Patient ID: [name], female    DOB: [birth year], 74 y.o.   MRN: [id number]  DOS:  01/27/2019 Type of visit - description: CPX Many other chronic issues addressed today  In general she feels well. Wonders about a foreign body at the left thigh, the area is tender and slightly swollen for 3 to 4 weeks and it happened after she injected insulin there, wonders if the needle is still there. Occasional diarrhea but no nausea, vomiting, blood in the stools.  Question of the stools being greasy sometimes.   Wt Readings from Last 3 Encounters:  01/27/19 182 lb 4 oz (82.7 kg)  09/29/18 174 lb (78.9 kg)  08/26/18 177 lb 2 oz (80.3 kg)     Review of Systems  Other than above, a 14 point review of systems is negative      Past Medical History:  Diagnosis Date  . Adenomatous colon polyp 03/1988  . Anemia    borderline  . Arthritis    R shoulder - degenerative   . Cataract    beginning stages  . Chest pain     Sept 2011: stress test neg  . Depression    sees Dr.Cotle  . Diabetes mellitus    dr Buddy Duty  . Diverticulosis   . Fatty liver    Increased LFTs, saw GI 06/2011, likely from fatty liver   . GERD (gastroesophageal reflux disease)   . History of hiatal hernia   . Hyperlipemia   . Hypertension   . IBS (irritable bowel syndrome)    and dyspepsia  . Memory impairment 08/2014   mild cognitive impairment vs. mild dementia, reommended reinstating of cholinesterase inhibitor   . Osteopenia    dexa 06/2007 and 10/11 Rx CA vitamin d  . RLS (restless legs syndrome)    h/o- no longer using Requip    Past Surgical History:  Procedure Laterality Date  . BREAST BIOPSY Left 10/11/2015   fibroadenoma w/ calcifications, no evidence of malignancy, recommend mammogram repeat-6 mnths  . POLYPECTOMY    . REVERSE SHOULDER ARTHROPLASTY Right 04/21/2014   Procedure: REVERSE SHOULDER ARTHROPLASTY;  Surgeon: Tania Ade, MD;  Location: [place];  Service: Orthopedics;   Laterality: Right;  Right reverse total shoulder replacement  . TONSILLECTOMY    . UTERINE FIBROID EMBOLIZATION     90s       Family History  Problem Relation Age of Onset  . Lung cancer Mother        smoker  . Colon cancer Father        F dx in his 55s  . Ovarian cancer Paternal Aunt        ?  . Diabetes Other        aunts-uncles   . Stroke Other        aunts-uncles   . Heart attack Maternal Grandmother        60s  . Other Other        niece- glioblastoma  . Breast cancer Neg Hx   . Rectal cancer Neg Hx   . Stomach cancer Neg Hx   . Esophageal cancer Neg Hx       Objective:   Physical Exam Musculoskeletal:       Legs:    BP 139/70 (BP Location: Left Arm, Patient Position: Sitting, Cuff Size: Small)   Pulse (!) 54   Temp (!) 96 F (35.6 C) (Temporal)   Resp 16   Ht 5\' 6"  (  1.676 m)   Wt 182 lb 4 oz (82.7 kg)   SpO2 97%   BMI 29.42 kg/m  General: Well developed, NAD, BMI noted Neck: No  thyromegaly  HEENT:  Normocephalic . Face symmetric, atraumatic Lungs:  CTA B Normal respiratory effort, no intercostal retractions, no accessory muscle use. Heart: RRR,  no murmur.  No pretibial edema bilaterally  Abdomen:  Not distended, soft, non-tender. No rebound or rigidity.   Skin: Exposed areas without rash. Not pale. Not jaundice Neurologic:  alert & oriented self, place.  Did not recall the day.  Speech normal, gait appropriate for age and unassisted Strength symmetric and appropriate for age.  Psych: Cognition and judgment appear intact.  Cooperative with normal attention span and concentration.  Behavior appropriate. No anxious or depressed appearing.     Assessment     Assessment  DM Dr. Buddy Duty HTN Hyperlipidemia H/o SIADH Depression seen elsewhere Mild cognitive impairment  MMSE 2015 --> 28, on Aricept, namenda added 04-2016 by Dr Everette Rank  OSA -- mild, also told RLS (Rx requip); saw Dr Gwenette Greet 2014, no CPAP GI: --IBS, colon polyps,  diverticulosis, hiatal hernia --Chronic constipation likely part of her IBS syndrome  --Fatty liver >>> GI eval 2013 --iron fec anemia:  cscope 04-2014 : 2 polyps; + hemocult @ GI office 10-2014: EGD done (-), bx neg Osteopenia: T score -1.1  2009 , osteopenia again per dexa 05-2013, t score -1.8 (10-09-17), scanned, rx ca-vit d B12 deficiency  RLS MSK: --DJD frozen shoulder Dizziness: chronic, carotid US 2016 neg, saw cards-- not likely CV related; saw neuro DR Everette Rank 02-2015  Chest pain 09-2009, negative stress test Thyroid nodules: Incidental   by Carotid US, BX 11-2014: Atypical findings, Bethesda III, f/u  Endocrine RLL Lung nodule: Stable per CT 09-2016, no need for further B/B incontinence:  uses diapers   PLAN: Here for CPX DM: Per Endo, A1c 6.0 (August 2020) Thyroid nodules: Per endocrine HTN: Continue atenolol, losartan, ambulatory BPs are not frequently taken but normal, encouraged to take ambulatory BPs regularly.  Checking labs Hyperlipidemia: Continue atorvastatin, checking labs Depression: Reports is well controlled, managed elsewhere MCI: On Aricept and Namenda, lives at an assisted living facility, still drives, her daughter helps with bills and paperwork, her medications are organized for her but she self administer.  Seems a stable.  Not sure if she will see neurology again, I'll RF medications if needed. B12 deficiency: On supplements, no change. DJD: On Tylenol, hardly ever takes Ultram. FB leg?  Unlikely, and to be sure we will get x-ray RTC 6 months   This visit occurred during the SARS-CoV-2 public health emergency.  Safety protocols were in place, including screening questions prior to the visit, additional usage of staff PPE, and extensive cleaning of exam room while observing appropriate contact time as indicated for disinfecting solutions.

## 2019-01-27 NOTE — Progress Notes (Signed)
Pre visit review using our clinic review tool, if applicable. No additional management support is needed unless otherwise documented below in the visit note. 

## 2019-01-27 NOTE — Assessment & Plan Note (Signed)
--  Td:  2014 -pneumovax 2009-2014;  prevnar: 12-2013 -Shingles shot 2010; S/p Shingrix x2 -Had a flu shot -female care:   Recommend to see Gynecology, no h/o abnormal paps; last  mammogram 07-2018 -CCS: FH Colon cancer, Cscope : 04/18/2009, cscope again 04-2014: +polyps, 5 years per GI letter  -Labs: CMP, FLP -Diet and exercise: Counseled.

## 2019-01-27 NOTE — Patient Instructions (Addendum)
Per our records you are due for an eye exam. Please contact your eye doctor to schedule an appointment. Please have them send copies of your office visit notes to [place]. Our fax number is [phone].   GO TO THE LAB : Get the blood work     GO TO THE FRONT DESK Schedule your next appointment   for a checkup in 6 months   STOP BY THE FIRST FLOOR:  get the XR    Check the  blood pressure weekly BP GOAL is between 110/65 and  135/85. If it is consistently higher or lower, let me know   Apply some hydrocortisone 1% OTC to your leg, call if the area is not improving  Please see your gynecology yearly  Continue taking vitamin B12 vitamin D supplements daily

## 2019-01-28 ENCOUNTER — Ambulatory Visit: Payer: Medicare Other | Admitting: Podiatry

## 2019-01-28 ENCOUNTER — Telehealth: Payer: Self-pay

## 2019-01-28 ENCOUNTER — Other Ambulatory Visit (INDEPENDENT_AMBULATORY_CARE_PROVIDER_SITE_OTHER): Payer: Medicare Other

## 2019-01-28 DIAGNOSIS — R300 Dysuria: Secondary | ICD-10-CM | POA: Diagnosis not present

## 2019-01-28 NOTE — Telephone Encounter (Signed)
Pt seen yesterday- she called this morning reporting waking w/ burning w/ urination-requesting to drop off urine sample. Okay per Dr. Larose Kells.

## 2019-01-28 NOTE — Assessment & Plan Note (Addendum)
Here for CPX DM: Per Endo, A1c 6.0 (August 2020) Thyroid nodules: Per endocrine HTN: Continue atenolol, losartan, ambulatory BPs are not frequently taken but normal, encouraged to take ambulatory BPs regularly.  Checking labs Hyperlipidemia: Continue atorvastatin, checking labs Depression: Reports is well controlled, managed elsewhere MCI: On Aricept and Namenda, lives at an assisted living facility, still drives, her daughter helps with bills and paperwork, her medications are organized for her but she self administer.  Seems a stable.  Not sure if she will see neurology again, I'll RF medications if needed. B12 deficiency: On supplements, no change. DJD: On Tylenol, hardly ever takes Ultram.  FB leg?  Unlikely, and to be sure we will get x-ray RTC 6 months

## 2019-01-29 LAB — URINALYSIS, ROUTINE W REFLEX MICROSCOPIC
Bilirubin Urine: NEGATIVE
Ketones, ur: NEGATIVE
Nitrite: POSITIVE — AB
Specific Gravity, Urine: 1.01 (ref 1.000–1.030)
Total Protein, Urine: NEGATIVE
Urine Glucose: NEGATIVE
Urobilinogen, UA: 1 (ref 0.0–1.0)
pH: 5.5 (ref 5.0–8.0)

## 2019-01-29 LAB — URINE CULTURE
MICRO NUMBER:: 10066562
SPECIMEN QUALITY:: ADEQUATE

## 2019-02-03 LAB — HM DIABETES EYE EXAM

## 2019-02-05 ENCOUNTER — Ambulatory Visit (INDEPENDENT_AMBULATORY_CARE_PROVIDER_SITE_OTHER): Payer: Medicare Other | Admitting: Internal Medicine

## 2019-02-05 DIAGNOSIS — N390 Urinary tract infection, site not specified: Secondary | ICD-10-CM

## 2019-02-05 MED ORDER — CIPROFLOXACIN HCL 500 MG PO TABS: 500.0000 mg | ORAL_TABLET | Freq: Two times a day (BID) | ORAL | 0 refills | Status: DC

## 2019-02-05 NOTE — Progress Notes (Signed)
Subjective:    Patient ID: [name], female    DOB: [birth year], 74 y.o.   MRN: [id number]  DOS:  02/05/2019 Type of visit - description: Virtual Visit via Video Note  I connected with the above patient  by a video enabled telemedicine application and verified that I am speaking with the correct person using two identifiers.   THIS ENCOUNTER IS A VIRTUAL VISIT DUE TO COVID-19 - PATIENT WAS NOT SEEN IN THE OFFICE. PATIENT HAS CONSENTED TO VIRTUAL VISIT / TELEMEDICINE VISIT   Location of patient: home  Location of provider: office  I discussed the limitations of evaluation and management by telemedicine and the availability of in person appointments. The patient expressed understanding and agreed to proceed.  Acute For the last 9 days she is having urinary symptoms, mostly dysuria and frequency. Few days ago I order urinalysis, it showed white cells and bacteria but eventually the culture was not significant. She calls today because symptoms are ongoing.      Review of Systems No fever chills No nausea or vomiting No vaginal discharge or vaginal rash No abdominal pain or flank pain  Past Medical History:  Diagnosis Date  . Adenomatous colon polyp 03/1988  . Anemia    borderline  . Arthritis    R shoulder - degenerative   . Cataract    beginning stages  . Chest pain     Sept 2011: stress test neg  . Depression    sees Dr.Cotle  . Diabetes mellitus    dr Buddy Duty  . Diverticulosis   . Fatty liver    Increased LFTs, saw GI 06/2011, likely from fatty liver   . GERD (gastroesophageal reflux disease)   . History of hiatal hernia   . Hyperlipemia   . Hypertension   . IBS (irritable bowel syndrome)    and dyspepsia  . Memory impairment 08/2014   mild cognitive impairment vs. mild dementia, reommended reinstating of cholinesterase inhibitor   . Osteopenia    dexa 06/2007 and 10/11 Rx CA vitamin d  . RLS (restless legs syndrome)    h/o- no longer using Requip     Past Surgical History:  Procedure Laterality Date  . BREAST BIOPSY Left 10/11/2015   fibroadenoma w/ calcifications, no evidence of malignancy, recommend mammogram repeat-6 mnths  . POLYPECTOMY    . REVERSE SHOULDER ARTHROPLASTY Right 04/21/2014   Procedure: REVERSE SHOULDER ARTHROPLASTY;  Surgeon: Tania Ade, MD;  Location: [place];  Service: Orthopedics;  Laterality: Right;  Right reverse total shoulder replacement  . TONSILLECTOMY    . UTERINE FIBROID EMBOLIZATION     90s        Objective:   Physical Exam There were no vitals taken for this visit. This is a virtual video visit, she is alert oriented x3, in no apparent distress.    Assessment      Assessment  DM Dr. Buddy Duty HTN Hyperlipidemia H/o SIADH Depression seen elsewhere Mild cognitive impairment  MMSE 2015 --> 28, on Aricept, namenda added 04-2016 by Dr Everette Rank  OSA -- mild, also told RLS (Rx requip); saw Dr Gwenette Greet 2014, no CPAP GI: --IBS, colon polyps, diverticulosis, hiatal hernia --Chronic constipation likely part of her IBS syndrome  --Fatty liver >>> GI eval 2013 --iron fec anemia:  cscope 04-2014 : 2 polyps; + hemocult @ GI office 10-2014: EGD done (-), bx neg Osteopenia: T score -1.1  2009 , osteopenia again per dexa 05-2013, t score -1.8 (10-09-17), scanned, rx ca-vit d  B12 deficiency  RLS MSK: --DJD frozen shoulder Dizziness: chronic, carotid US 2016 neg, saw cards-- not likely CV related; saw neuro DR Everette Rank 02-2015  Chest pain 09-2009, negative stress test Thyroid nodules: Incidental   by Carotid US, BX 11-2014: Atypical findings, Bethesda III, f/u  Endocrine RLL Lung nodule: Stable per CT 09-2016, no need for further B/B incontinence:  uses diapers   PLAN: Recurrent UTI: The patient presents with UTI-like symptoms, she has bacteriuria and leukocyturia although the urine test showed no significant infection.  On chart review,  she has recurrent UTIs, previously not formally evaluated to my  knowledge. At this point I will treat her empirically, she is allergic to penicillin, sulfa.  Macrobid is to be avoided in the elderly, options are limited. Plan:  No history of aneurysms consequently will Rx Cipro for 5 days. Refer to gynecology for recurrent UTIs, has seen Dr. Valentino Saxon before:  Vaginal dryness?    I discussed the assessment and treatment plan with the patient. The patient was provided an opportunity to ask questions and all were answered. The patient agreed with the plan and demonstrated an understanding of the instructions.   The patient was advised to call back or seek an in-person evaluation if the symptoms worsen or if the condition fails to improve as anticipated.

## 2019-02-07 ENCOUNTER — Encounter: Payer: Self-pay | Admitting: Internal Medicine

## 2019-02-07 NOTE — Assessment & Plan Note (Signed)
Recurrent UTI: The patient presents with UTI-like symptoms, she has bacteriuria and leukocyturia although the urine test showed no significant infection.  On chart review,  she has recurrent UTIs, previously not formally evaluated to my knowledge. At this point I will treat her empirically, she is allergic to penicillin, sulfa.  Macrobid is to be avoided in the elderly, options are limited. Plan:  No history of aneurysms consequently will Rx Cipro for 5 days. Refer to gynecology for recurrent UTIs, has seen Dr. Valentino Saxon before:  Vaginal dryness?

## 2019-02-08 ENCOUNTER — Encounter: Payer: Self-pay | Admitting: Internal Medicine

## 2019-02-10 ENCOUNTER — Other Ambulatory Visit: Payer: Self-pay

## 2019-02-11 ENCOUNTER — Encounter: Payer: Self-pay | Admitting: Internal Medicine

## 2019-02-11 ENCOUNTER — Ambulatory Visit (INDEPENDENT_AMBULATORY_CARE_PROVIDER_SITE_OTHER): Payer: Medicare Other | Admitting: Internal Medicine

## 2019-02-11 ENCOUNTER — Ambulatory Visit: Payer: Medicare Other | Admitting: Podiatry

## 2019-02-11 VITALS — Ht 66.0 in

## 2019-02-11 DIAGNOSIS — S7012XA Contusion of left thigh, initial encounter: Secondary | ICD-10-CM

## 2019-02-11 DIAGNOSIS — T148XXA Other injury of unspecified body region, initial encounter: Secondary | ICD-10-CM

## 2019-02-11 NOTE — Progress Notes (Signed)
Pre visit review using our clinic review tool, if applicable. No additional management support is needed unless otherwise documented below in the visit note. 

## 2019-02-11 NOTE — Progress Notes (Signed)
Subjective:    Patient ID: [name], female    DOB: November 25, 1945, 73 y.o.   MRN: [id number]  DOS:  02/11/2019 Type of visit - description: Virtual Visit via Telephone  Attempted  to make this a video visit, due to technical difficulties from the patient side it was not possible  thus we proceeded with a Virtual Visit via Telephone    I connected with above mentioned patient  by telephone and verified that I am speaking with the correct person using two identifiers.  THIS ENCOUNTER IS A VIRTUAL VISIT DUE TO COVID-19 - PATIENT WAS NOT SEEN IN THE OFFICE. PATIENT HAS CONSENTED TO VIRTUAL VISIT / TELEMEDICINE VISIT   Location of patient: home  Location of provider: office  I discussed the limitations, risks, security and privacy concerns of performing an evaluation and management service by telephone and the availability of in person appointments. I also discussed with the patient that there may be a patient responsible charge related to this service. The patient expressed understanding and agreed to proceed.  Acute 2 days ago noted a 1.5 inch, round flat bruise on the inner aspect of the left thigh. There is not tenderness to touch, does not feel warm or hot. Color is purple. Does not recall any injury. No other lesions similar to that one that she can tell. The lower extremities are not swollen. No fever or chills   Review of Systems See above   Past Medical History:  Diagnosis Date  . Adenomatous colon polyp 03/1988  . Anemia    borderline  . Arthritis    R shoulder - degenerative   . Cataract    beginning stages  . Chest pain     Sept 2011: stress test neg  . Depression    sees Dr.Cotle  . Diabetes mellitus    dr Buddy Duty  . Diverticulosis   . Fatty liver    Increased LFTs, saw GI 06/2011, likely from fatty liver   . GERD (gastroesophageal reflux disease)   . History of hiatal hernia   . Hyperlipemia   . Hypertension   . IBS (irritable bowel syndrome)    and  dyspepsia  . Memory impairment 08/2014   mild cognitive impairment vs. mild dementia, reommended reinstating of cholinesterase inhibitor   . Osteopenia    dexa 06/2007 and 10/11 Rx CA vitamin d  . RLS (restless legs syndrome)    h/o- no longer using Requip    Past Surgical History:  Procedure Laterality Date  . BREAST BIOPSY Left 10/11/2015   fibroadenoma w/ calcifications, no evidence of malignancy, recommend mammogram repeat-6 mnths  . POLYPECTOMY    . REVERSE SHOULDER ARTHROPLASTY Right 04/21/2014   Procedure: REVERSE SHOULDER ARTHROPLASTY;  Surgeon: Tania Ade, MD;  Location: [place] [facility];  Service: Orthopedics;  Laterality: Right;  Right reverse total shoulder replacement  . TONSILLECTOMY    . UTERINE FIBROID EMBOLIZATION     90s        Objective:   Physical Exam Ht 5\' 6"  (1.676 m)   BMI 29.42 kg/m  This is a virtual telephone visit, she is alert, speech is at baseline, takes time to come up with the words but eventually she does.  In no distress.    Assessment     Assessment  DM Dr. Buddy Duty HTN Hyperlipidemia H/o SIADH Depression seen elsewhere Mild cognitive impairment  MMSE 2015 --> 28, on Aricept, namenda added 04-2016 by Dr Everette Rank  OSA -- mild, also told RLS (Rx  requip); saw Dr Gwenette Greet 2014, no CPAP GI: --IBS, colon polyps, diverticulosis, hiatal hernia --Chronic constipation likely part of her IBS syndrome  --Fatty liver >>> GI eval 2013 --iron fec anemia:  cscope 04-2014 : 2 polyps; + hemocult @ GI office 10-2014: EGD done (-), bx neg Osteopenia: T score -1.1  2009 , osteopenia again per dexa 05-2013, t score -1.8 (10-09-17), scanned, rx ca-vit d B12 deficiency  RLS MSK: --DJD frozen shoulder Dizziness: chronic, carotid US 2016 neg, saw cards-- not likely CV related; saw neuro DR Everette Rank 02-2015  Chest pain 09-2009, negative stress test Thyroid nodules: Incidental   by Carotid US, BX 11-2014: Atypical findings, Bethesda III, f/u  Endocrine RLL Lung nodule: Stable per  CT 09-2016, no need for further B/B incontinence:  uses diapers   PLAN: Bruise, left leg: The patient is aware of the limitation of this phone call, she probably has a bruise, for now recommend observation, anticipate the color might turn greenish and it might spread a little bit due to gravity. If she notice a severe increase in size with a dark color, swelling, pain, fever chills she needs to let me know. The patient has dementia consequently we contacted the patient's daughter [name] to be sure somebody else is aware of the issue.     I discussed the assessment and treatment plan with the patient. The patient was provided an opportunity to ask questions and all were answered. The patient agreed with the plan and demonstrated an understanding of the instructions.   The patient was advised to call back or seek an in-person evaluation if the symptoms worsen or if the condition fails to improve as anticipated.  I provided 15 minutes of non-face-to-face time during this encounter.  Kathlene November, MD

## 2019-02-12 NOTE — Assessment & Plan Note (Signed)
Bruise, left leg: The patient is aware of the limitation of this phone call, she probably has a bruise, for now recommend observation, anticipate the color might turn greenish and it might spread a little bit due to gravity. If she notice a severe increase in size with a dark color, swelling, pain, fever chills she needs to let me know. The patient has dementia consequently we contacted the patient's daughter [name] to be sure somebody else is aware of the issue.

## 2019-02-16 ENCOUNTER — Telehealth: Payer: Self-pay

## 2019-02-16 NOTE — Telephone Encounter (Signed)
PT plan of care signed and faxed to [facility] at [phone]. Form sent for scanning. Received fax confirmation.

## 2019-03-01 ENCOUNTER — Other Ambulatory Visit: Payer: Self-pay | Admitting: Internal Medicine

## 2019-03-09 ENCOUNTER — Ambulatory Visit
Admission: RE | Admit: 2019-03-09 | Discharge: 2019-03-09 | Disposition: A | Discharge status: Home / Self Care | Payer: Medicare Other | Source: Ambulatory Visit | Attending: Internal Medicine | Admitting: Internal Medicine

## 2019-03-09 ENCOUNTER — Other Ambulatory Visit: Payer: Self-pay | Admitting: Internal Medicine

## 2019-03-09 DIAGNOSIS — R52 Pain, unspecified: Secondary | ICD-10-CM

## 2019-03-16 ENCOUNTER — Ambulatory Visit: Payer: Medicare Other | Admitting: Gastroenterology

## 2019-03-23 ENCOUNTER — Ambulatory Visit: Payer: Medicare Other | Admitting: Physical Therapy

## 2019-03-30 ENCOUNTER — Encounter: Payer: Self-pay | Admitting: Physical Therapy

## 2019-03-30 ENCOUNTER — Ambulatory Visit: Payer: Medicare Other | Attending: Urology | Admitting: Physical Therapy

## 2019-03-30 ENCOUNTER — Other Ambulatory Visit: Payer: Self-pay

## 2019-03-30 DIAGNOSIS — M6281 Muscle weakness (generalized): Secondary | ICD-10-CM | POA: Diagnosis not present

## 2019-03-30 DIAGNOSIS — R279 Unspecified lack of coordination: Secondary | ICD-10-CM | POA: Diagnosis present

## 2019-03-30 DIAGNOSIS — R296 Repeated falls: Secondary | ICD-10-CM | POA: Insufficient documentation

## 2019-03-30 NOTE — Patient Instructions (Addendum)
Parameters for Pelvic Stimulation Strength  50 Hz Pulse width 0.2 milliesec Intensity 120 mA  Stress Incontinence 20-50 Hz, 250 milliesec, 25 min. , time on/off 4:8  normally requires strengthening of the pelvic floor muscles and will use programs in the range of 20 - 40 Hz with various work/rest times. When starting out, choose a program with a longer rest than work. As you progress you can shorten the rest time and increase the work time which will help build endurance in your pelvic floor muscles. If you find the preset program in the 30-40Hz  range too strong or uncomfortable, you can try the preset 20Hz  program and once used to that you can change to the higher frequency program of  30-40Hz .   Urge incontinence is 10-15 Hz uses lower frequencies or Hz which are aimed at calming the nerves to the bladder and rectum, making them less irritated. These programs will normally be around 10 Hz.  Pain relief is 200 Hz Pelvic Pain : pain settings are normally at the lower end around 2-5Hz  or the higher end 80-150Hz . T  Who should NOT use electrical stimulation? Some people cannot use electrical stimulation including those with:  IUD, pessary (when in situ) Check with your treating health professional Cardiac pacemaker or cardiac arrhythmia Pregnancy or planning a pregnancy Broken/irritated skin in the rectal, vaginal or perineal area Rectal bleeding or haemorrhoids Bladder or vaginal infections Seizure and dementia conditions  iSTIM EV-805 TENS EMS 4 Channel Rechargeable Combo Machine Unit - Muscle Stimulator + Back Pain Relief and Management- 24 Programs/Backlit (Including Electrodes Pads)-[employer]    iStim PR-02 Probe for Kegel Exercise, Pelvic Floor Electrical Muscle Stimulation, Incontinence - Compatible with TENS/EMS-Amazon     K-fit Kegel Toner for Women - Theatre stage manager for Automatic Kegels for [employer]   iStim Kegel Exerciser Incontinence Stimulator  with Probe - iStim V2 - for Bladder Control and Pelvic Floor Exercise - for Women and Men - Water engineer (EMS)- [employer] . They are used in the vagina to hydrate the mucous membrane that make up the vaginal canal. . Designed to keep a more normal acid balance (ph) . Once placed in the vagina, it will last between two to three days.  . Use 2-3 times per week at bedtime  . Ingredients to avoid is glycerin and fragrance, can increase chance of infection . Should not be used just before sex due to causing irritation . Most are gels administered either in a tampon-shaped applicator or as a vaginal suppository. They are non-hormonal.   Types of Moisturizers  . Vitamin E vaginal suppositories- Whole foods, Amazon . Moist Again . Coconut oil- can break down condoms . Julva- (Do no use if on Tamoxifen) amazon . Yes moisturizer- amazon . NeuEve Silk , NeuEve Silver for menopausal or over 65 (if have severe vaginal atrophy or cancer treatments use NeuEve Silk for  1 month than move to [employer])- [employer], MapleFlower.dk . Olive and Bee intimate cream- www.oliveandbee.com.au . Mae vaginal [place] . Aloe .    Creams to use externally on the Vulva area  Albertson's (good for for cancer patients that had radiation to the area)- [place] or [employer].FlyingBasics.com.br  V-magic cream - amazon  Julva-amazon  Vital "V [place] salve ( help moisturize and help with thinning vulvar area, does have [place] by Irwin Brakeman labial moisturizer ([place],  Coconut or olive oil  aloe   Things to avoid in the vaginal area . Do not use things to irritate the vulvar area . No lotions just specialized creams for the vulva area- Neogyn, V-magic, No soaps; can use Aveeno or Calendula cleanser if needed. Must be gentle . No deodorants . No douches . Good to sleep without underwear  to let the vaginal area to air out . No scrubbing: spread the lips to let warm water rinse over labias and pat dry

## 2019-03-30 NOTE — Therapy (Signed)
[facility] [address] [place], [place], [address] Phone: [phone]   Fax:  [phone]  Physical Therapy Evaluation  Patient Details  Name: [name] MRN: [id number] Date of Birth: 04-17-1945 Referring Provider (PT): Kathie Rhodes, MD   Encounter Date: 03/30/2019  PT End of Session - 03/30/19 1122    Visit Number  1    Date for PT Re-Evaluation  06/22/19    PT Start Time  1112   arrived late   PT Stop Time  1146    PT Time Calculation (min)  34 min    Activity Tolerance  Patient tolerated treatment well    Behavior During Therapy  [facility] for tasks assessed/performed       Past Medical History:  Diagnosis Date  . Adenomatous colon polyp 03/1988  . Anemia    borderline  . Arthritis    R shoulder - degenerative   . Cataract    beginning stages  . Chest pain     Sept 2011: stress test neg  . Depression    sees Dr.Cotle  . Diabetes mellitus    dr Buddy Duty  . Diverticulosis   . Fatty liver    Increased LFTs, saw GI 06/2011, likely from fatty liver   . GERD (gastroesophageal reflux disease)   . History of hiatal hernia   . Hyperlipemia   . Hypertension   . IBS (irritable bowel syndrome)    and dyspepsia  . Memory impairment 08/2014   mild cognitive impairment vs. mild dementia, reommended reinstating of cholinesterase inhibitor   . Osteopenia    dexa 06/2007 and 10/11 Rx [place] vitamin d  . RLS (restless legs syndrome)    h/o- no longer using Requip    Past Surgical History:  Procedure Laterality Date  . BREAST BIOPSY Left 10/11/2015   fibroadenoma w/ calcifications, no evidence of malignancy, recommend mammogram repeat-6 mnths  . POLYPECTOMY    . REVERSE SHOULDER ARTHROPLASTY Right 04/21/2014   Procedure: REVERSE SHOULDER ARTHROPLASTY;  Surgeon: Tania Ade, MD;  Location: [place] [facility];  Service: Orthopedics;  Laterality: Right;  Right reverse total shoulder replacement  . TONSILLECTOMY    . UTERINE  FIBROID EMBOLIZATION     90s    There were no vitals filed for this visit.   Subjective Assessment - 03/30/19 1114    Subjective  Pt states she has had increased leakage of both bowel and bladder and using 3-4 depends/day.  I was mostly using regular underwear.  She states leakage  is sometimes a little, but cannot stop the stream once it starts.  Pt states she started having more lower back pain as well.    Currently in Pain?  Yes    Pain Score  7     Pain Location  Back    Pain Orientation  Lower    Pain Descriptors / Indicators  Aching    Pain Type  Chronic pain    Pain Onset  More than a month ago    Pain Frequency  Intermittent    Aggravating Factors   not sure    Pain Relieving Factors  tylenol    Multiple Pain Sites  No         [facility] PT Assessment - 03/30/19 0001      Assessment   Medical Diagnosis  N39.41 (ICD-10-CM) - Urge incontinence    Referring Provider (PT)  Kathie Rhodes, MD    Prior Therapy  Yes, last year for same  issue      Precautions   Precautions  None      Restrictions   Weight Bearing Restrictions  No      Balance Screen   Has the patient fallen in the past 6 months  Yes    How many times?  can't remember but falls regularly    Has the patient had a decrease in activity level because of a fear of falling?   Yes    Is the patient reluctant to leave their home because of a fear of falling?   No      Home Film/video editor residence      Prior Function   Level of Independence  Independent      Cognition   Overall Cognitive Status  Within Functional Limits for tasks assessed      Posture/Postural Control   Posture/Postural Control  Postural limitations    Postural Limitations  Rounded Shoulders;Flexed trunk;Increased thoracic kyphosis      ROM / Strength   AROM / PROM / Strength  Strength      Strength   Overall Strength Comments  LE grossly 4-/5 Lt LE; 4/5 Rt LE      Palpation   Palpation comment  tight lumbar  paraspinals      Special Tests   Other special tests  unable to attempt SLS on Lt side      Transfers   Five time sit to stand comments   15 sec with heavy use of UE      Ambulation/Gait   Gait Pattern  Poor foot clearance - left;Decreased stride length;Decreased step length - right;Decreased step length - left;Trunk flexed                Objective measurements completed on examination: See above findings.    Pelvic Floor Special Questions - 03/30/19 0001    Prior Pelvic/Prostate Exam  Yes    Currently Sexually Active  No    Urinary Leakage  Yes    How often  every day several times    Pad use  3-4 depends    Activities that cause leaking  Coughing;Sneezing;Bending;Walking;With strong urge;Exercising;Other    Other activities that cause leaking  unknown; can't tell    Falling out feeling (prolapse)  No    Skin Integrity  Other    Skin Integrity other  dryness    Perineal Body/Introitus   Gaping    Pelvic Floor Internal Exam  pt identity confirmed and informed consent given to perform internal soft tissue assessment    Exam Type  Vaginal    Palpation  feel gluteals compensating for pelvic floor    Strength  Flicker    Strength # of seconds  1    Tone  gets higher with palpation then not relaxing       [facility] Adult PT Treatment/Exercise - 03/30/19 0001      Self-Care   Self-Care  Other Self-Care Comments    Other Self-Care Comments   educated in moisturizing and getting stim unit for strengthening             PT Education - 03/30/19 1543    Education Details  moisturizers and stim unit    Person(s) Educated  Patient    Methods  Explanation;Handout    Comprehension  Verbalized understanding       PT Short Term Goals - 03/30/19 1546      PT SHORT TERM GOAL #1  Title  Pt will report only needing 2 pad/depends per day    Time  6    Period  Weeks    Status  New    Target Date  05/11/19      PT SHORT TERM GOAL #2   Title  Pt will be ind with  initial HEP    Time  6    Period  Weeks    Status  New    Target Date  05/11/19      PT SHORT TERM GOAL #3   Title  Pt will be able to perform 5 x sit to stand with no UE assistance    Time  6    Period  Weeks    Status  New    Target Date  05/11/19        PT Long Term Goals - 03/30/19 1544      PT LONG TERM GOAL #1   Title  Pt will report at least 50% less urinary and fecal leakage     Time  12    Period  Weeks    Status  New    Target Date  06/22/19      PT LONG TERM GOAL #2   Title  pt will be ind with advanced HEP    Time  12    Period  Weeks    Status  New    Target Date  06/22/19      PT LONG TERM GOAL #3   Title  Pt will report needing only 1 pad/depends per day    Baseline  3-4    Time  12    Period  Weeks    Status  New    Target Date  06/22/19      PT LONG TERM GOAL #4   Title  Pt will demonstrate 5x sit to stand in <13 seconds for reduced risk of falls    Time  12    Period  Weeks    Status  New    Target Date  06/22/19             Plan - 03/30/19 1549    Clinical Impression Statement  Pt presents to clinic today due to increased urinary and bowel leakage.  She demonstrates posture abnormalities as mentioned above and reports chronic low back pain.  She has reported falls and has increased risk of falls based on 5 xSTS with age related norms.  Pt has LE weakness as well from 4-/5 to 4/5.  Pt mainly uses gluteals to engage the pelvic floor.  She demonstrates poor coordination and muscle control with 1/5 MMT after tapotement.  Pt will benefit from skilled PT to improve function and reduce risk of falls.    Personal Factors and Comorbidities  Age;Time since onset of injury/illness/exacerbation;Comorbidity 2    Comorbidities  impaired memory; knee OA    Examination-Activity Limitations  Toileting;Continence    Examination-Participation Restrictions  Community Activity    Stability/Clinical Decision Making  Evolving/Moderate complexity    Clinical  Decision Making  Moderate    Rehab Potential  Good    PT Frequency  2x / week    PT Duration  12 weeks    PT Treatment/Interventions  ADLs/Self Care Home Management;Biofeedback;Cryotherapy;Electrical Stimulation;Moist Heat;Neuromuscular re-education;Therapeutic exercise;Therapeutic activities;Taping;Dry needling;Passive range of motion;Patient/family education;Manual techniques    PT Next Visit Plan  internal STM tapping and stroking to engage circular contraction; biofeedback if needed; prone kegel with hip IR  PT Home Exercise Plan  moisture and stim unit    Consulted and Agree with Plan of Care  Patient       Patient will benefit from skilled therapeutic intervention in order to improve the following deficits and impairments:  Abnormal gait, Decreased coordination, Pain, Postural dysfunction, Decreased strength  Visit Diagnosis: Muscle weakness (generalized)  Unspecified lack of coordination  Repeated falls     Problem List Patient Active Problem List   Diagnosis Date Noted  . Dark stools 03/17/2017  . SIADH (syndrome of inappropriate ADH production) ([place]) 10/10/2016  . Gastroesophageal reflux disease 08/06/2016  . PCP Notes >>>>>>>>>>>>>>>>> 09/21/2014  . Rotator cuff tear arthropathy 04/21/2014  . Abnormal MRI, shoulder 12/01/2013  . 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 Cervical radiculitis 11/10/2013  . Bursitis of right shoulder 08/19/2013  . Primary localized osteoarthrosis, lower leg 08/19/2013  . Vitamin B 12 deficiency 05/20/2013  . OSA (obstructive sleep apnea) 02/13/2012  . Pulmonary nodule, last  CT 2016, stable, no f/u 01/28/2012  . Dementia ([place]) 05/29/2011  . Fatty liver 02/27/2011  . Annual physical exam 11/26/2010  . Osteopenia 10/09/2007  . COLONIC POLYPS 08/22/2006  . DM II (diabetes mellitus, type II), controlled ([place]) 05/19/2006  . Hyperlipidemia 05/19/2006  . Depression 05/19/2006  . SYNDROME, RESTLESS LEGS 05/19/2006  . Essential  hypertension 05/19/2006  . IBS -- constipation 05/19/2006    Jule Ser, PT 03/30/2019, 3:58 PM  [facility] [address], [place], [place], [address] Phone: [phone]   Fax:  [phone]  Name: [name] MRN: [id number] Date of Birth: [birth year]

## 2019-04-01 ENCOUNTER — Ambulatory Visit: Payer: Medicare Other | Admitting: Physical Therapy

## 2019-04-01 ENCOUNTER — Telehealth: Payer: Self-pay | Admitting: Physical Therapy

## 2019-04-01 NOTE — Telephone Encounter (Signed)
Called patient and she reports she forgot and went to an event.  She rescheduled for tomorrow.  Gustavus Bryant, PT 04/01/19 12:14 PM

## 2019-04-02 ENCOUNTER — Encounter: Payer: Self-pay | Admitting: Physical Therapy

## 2019-04-02 ENCOUNTER — Ambulatory Visit: Payer: Medicare Other | Admitting: Physical Therapy

## 2019-04-02 ENCOUNTER — Other Ambulatory Visit: Payer: Self-pay

## 2019-04-02 DIAGNOSIS — M6281 Muscle weakness (generalized): Secondary | ICD-10-CM

## 2019-04-02 DIAGNOSIS — R296 Repeated falls: Secondary | ICD-10-CM

## 2019-04-02 DIAGNOSIS — R279 Unspecified lack of coordination: Secondary | ICD-10-CM

## 2019-04-02 NOTE — Patient Instructions (Signed)
Access Code: [email or web address] URL: [email or web address] Date: 04/02/2019 Prepared by: Jari Favre  Exercises Supine Hip Adductor Squeeze with Small Ball - 3 x daily - 7 x weekly - 10 reps - 1 sets - 3 sec hold

## 2019-04-02 NOTE — Therapy (Signed)
[facility] [address] [place], [place], [address] Phone: [phone]   Fax:  [phone]  Physical Therapy Treatment  Patient Details  Name: [name] MRN: [id number] Date of Birth: [birth year] Referring Provider (PT): Kathie Rhodes, MD   Encounter Date: 04/02/2019  PT End of Session - 04/02/19 0920    Visit Number  2    Date for PT Re-Evaluation  06/22/19    PT Start Time  [id number]   pt arrived late   PT Stop Time  0925    PT Time Calculation (min)  30 min    Activity Tolerance  Patient tolerated treatment well    Behavior During Therapy  [facility] for tasks assessed/performed       Past Medical History:  Diagnosis Date  . Adenomatous colon polyp 03/1988  . Anemia    borderline  . Arthritis    R shoulder - degenerative   . Cataract    beginning stages  . Chest pain     Sept 2011: stress test neg  . Depression    sees Dr.Cotle  . Diabetes mellitus    dr Buddy Duty  . Diverticulosis   . Fatty liver    Increased LFTs, saw GI 06/2011, likely from fatty liver   . GERD (gastroesophageal reflux disease)   . History of hiatal hernia   . Hyperlipemia   . Hypertension   . IBS (irritable bowel syndrome)    and dyspepsia  . Memory impairment 08/2014   mild cognitive impairment vs. mild dementia, reommended reinstating of cholinesterase inhibitor   . Osteopenia    dexa 06/2007 and 10/11 Rx [place] vitamin d  . RLS (restless legs syndrome)    h/o- no longer using Requip    Past Surgical History:  Procedure Laterality Date  . BREAST BIOPSY Left 10/11/2015   fibroadenoma w/ calcifications, no evidence of malignancy, recommend mammogram repeat-6 mnths  . POLYPECTOMY    . REVERSE SHOULDER ARTHROPLASTY Right 04/21/2014   Procedure: REVERSE SHOULDER ARTHROPLASTY;  Surgeon: Tania Ade, MD;  Location: [place] [facility];  Service: Orthopedics;  Laterality: Right;  Right reverse total shoulder replacement  . TONSILLECTOMY    . UTERINE  FIBROID EMBOLIZATION     90s    There were no vitals filed for this visit.  Subjective Assessment - 04/02/19 0912    Subjective  Pt has not tried using any moisturizers yet.    Currently in Pain?  No/denies                    Pelvic Floor Special Questions - 04/02/19 0001    Pelvic Floor Internal Exam  pt identity confirmed and informed consent given to perform internal soft tissue cueing        [facility] Adult PT Treatment/Exercise - 04/02/19 0001      Neuro Re-ed    Neuro Re-ed Details   supine hooklying - srtoking and tapping muscles bulbocav, levators, urethra sphincters - to stimulate muscle activity; hooklying with IR hip rotation; hooklying with ball squeeze  - cues to deactivate gluteals - 3 sec hold and 5 sec rest             PT Education - 04/02/19 0933    Education Details  Access Code: [email or web address]    Person(s) Educated  Patient    Methods  Explanation;Demonstration;Tactile cues;Verbal cues;Handout    Comprehension  Verbalized understanding;Returned demonstration       PT Short Term Goals - 03/30/19 1546  PT SHORT TERM GOAL #1   Title  Pt will report only needing 2 pad/depends per day    Time  6    Period  Weeks    Status  New    Target Date  05/11/19      PT SHORT TERM GOAL #2   Title  Pt will be ind with initial HEP    Time  6    Period  Weeks    Status  New    Target Date  05/11/19      PT SHORT TERM GOAL #3   Title  Pt will be able to perform 5 x sit to stand with no UE assistance    Time  6    Period  Weeks    Status  New    Target Date  05/11/19        PT Long Term Goals - 03/30/19 1544      PT LONG TERM GOAL #1   Title  Pt will report at least 50% less urinary and fecal leakage     Time  12    Period  Weeks    Status  New    Target Date  06/22/19      PT LONG TERM GOAL #2   Title  pt will be ind with advanced HEP    Time  12    Period  Weeks    Status  New    Target Date  06/22/19      PT LONG TERM GOAL #3    Title  Pt will report needing only 1 pad/depends per day    Baseline  3-4    Time  12    Period  Weeks    Status  New    Target Date  06/22/19      PT LONG TERM GOAL #4   Title  Pt will demonstrate 5x sit to stand in <13 seconds for reduced risk of falls    Time  12    Period  Weeks    Status  New    Target Date  06/22/19            Plan - 04/02/19 0926    Clinical Impression Statement  Today's session focused on patient getting improved circular contraction and lifting of urethra.  Pt demonstrates improved strength with tactile cues.  She cannot tell when she is engaging her muscles but she did improve with adding a ball squeeze and able to reduce gluteal contraction.  Pt will benefit from skiled PT to continue working on strength and muscle coordination    Personal Factors and Comorbidities  Age;Time since onset of injury/illness/exacerbation;Comorbidity 2    Comorbidities  impaired memory; knee OA    PT Treatment/Interventions  ADLs/Self Care Home Management;Biofeedback;Cryotherapy;Electrical Stimulation;Moist Heat;Neuromuscular re-education;Therapeutic exercise;Therapeutic activities;Taping;Dry needling;Passive range of motion;Patient/family education;Manual techniques    PT Next Visit Plan  internal STM tapping and stroking to engage circular contraction; biofeedback if needed; prone kegel with hip IR    PT Home Exercise Plan  Access Code: [email or web address]    Consulted and Agree with Plan of Care  Patient       Patient will benefit from skilled therapeutic intervention in order to improve the following deficits and impairments:  Abnormal gait, Decreased coordination, Pain, Postural dysfunction, Decreased strength  Visit Diagnosis: Muscle weakness (generalized)  Unspecified lack of coordination  Repeated falls     Problem List Patient Active Problem List   Diagnosis Date Noted  .  Dark stools 03/17/2017  . SIADH (syndrome of inappropriate ADH production) ([place]) 10/10/2016   . Gastroesophageal reflux disease 08/06/2016  . PCP Notes >>>>>>>>>>>>>>>>> 09/21/2014  . Rotator cuff tear arthropathy 04/21/2014  . Abnormal MRI, shoulder 12/01/2013  . 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 Cervical radiculitis 11/10/2013  . Bursitis of right shoulder 08/19/2013  . Primary localized osteoarthrosis, lower leg 08/19/2013  . Vitamin B 12 deficiency 05/20/2013  . OSA (obstructive sleep apnea) 02/13/2012  . Pulmonary nodule, last  CT 2016, stable, no f/u 01/28/2012  . Dementia ([place]) 05/29/2011  . Fatty liver 02/27/2011  . Annual physical exam 11/26/2010  . Osteopenia 10/09/2007  . COLONIC POLYPS 08/22/2006  . DM II (diabetes mellitus, type II), controlled ([place]) 05/19/2006  . Hyperlipidemia 05/19/2006  . Depression 05/19/2006  . SYNDROME, RESTLESS LEGS 05/19/2006  . Essential hypertension 05/19/2006  . IBS -- constipation 05/19/2006    Jule Ser, PT 04/02/2019, 9:43 AM  [facility] [address] [place], [place], [address] Phone: [phone]   Fax:  [phone]  Name: [name] MRN: [id number] Date of Birth: 1945/09/24

## 2019-04-08 ENCOUNTER — Ambulatory Visit: Payer: Medicare Other | Attending: Urology | Admitting: Physical Therapy

## 2019-04-08 ENCOUNTER — Encounter: Payer: Self-pay | Admitting: Physical Therapy

## 2019-04-08 ENCOUNTER — Other Ambulatory Visit: Payer: Self-pay

## 2019-04-08 DIAGNOSIS — R296 Repeated falls: Secondary | ICD-10-CM | POA: Diagnosis not present

## 2019-04-08 DIAGNOSIS — R279 Unspecified lack of coordination: Secondary | ICD-10-CM | POA: Diagnosis present

## 2019-04-08 DIAGNOSIS — M6281 Muscle weakness (generalized): Secondary | ICD-10-CM | POA: Diagnosis present

## 2019-04-08 NOTE — Therapy (Signed)
[facility] [address] [place], [place], [address] Phone: [phone]   Fax:  [phone]  Physical Therapy Treatment  Patient Details  Name: [name] MRN: [id number] Date of Birth: [birth year] Referring Provider (PT): Kathie Rhodes, MD   Encounter Date: 04/08/2019  PT End of Session - 04/08/19 1457    Visit Number  3    Date for PT Re-Evaluation  06/22/19    PT Start Time  1453   arrived late   PT Stop Time  1530    PT Time Calculation (min)  37 min    Activity Tolerance  Patient tolerated treatment well    Behavior During Therapy  [facility] for tasks assessed/performed       Past Medical History:  Diagnosis Date  . Adenomatous colon polyp 03/1988  . Anemia    borderline  . Arthritis    R shoulder - degenerative   . Cataract    beginning stages  . Chest pain     Sept 2011: stress test neg  . Depression    sees Dr.Cotle  . Diabetes mellitus    dr Buddy Duty  . Diverticulosis   . Fatty liver    Increased LFTs, saw GI 06/2011, likely from fatty liver   . GERD (gastroesophageal reflux disease)   . History of hiatal hernia   . Hyperlipemia   . Hypertension   . IBS (irritable bowel syndrome)    and dyspepsia  . Memory impairment 08/2014   mild cognitive impairment vs. mild dementia, reommended reinstating of cholinesterase inhibitor   . Osteopenia    dexa 06/2007 and 10/11 Rx CA vitamin d  . RLS (restless legs syndrome)    h/o- no longer using Requip    Past Surgical History:  Procedure Laterality Date  . BREAST BIOPSY Left 10/11/2015   fibroadenoma w/ calcifications, no evidence of malignancy, recommend mammogram repeat-6 mnths  . POLYPECTOMY    . REVERSE SHOULDER ARTHROPLASTY Right 04/21/2014   Procedure: REVERSE SHOULDER ARTHROPLASTY;  Surgeon: Tania Ade, MD;  Location: [place];  Service: Orthopedics;  Laterality: Right;  Right reverse total shoulder replacement  . TONSILLECTOMY    . UTERINE  FIBROID EMBOLIZATION     90s    There were no vitals filed for this visit.  Subjective Assessment - 04/08/19 1457    Subjective  Maybe a little better.  Still cannot tell if I'm doing it right.    Currently in Pain?  No/denies                       [facility] Adult PT Treatment/Exercise - 04/08/19 0001      Neuro Re-ed    Neuro Re-ed Details   biofeedback in sitting and supin; ball squeeze contract and relax x 4 sec each; standing work on relaxing resting tone up to >12 mV and unable to rest initially; cues to breathe helped reduce resting tone               PT Short Term Goals - 03/30/19 1546      PT SHORT TERM GOAL #1   Title  Pt will report only needing 2 pad/depends per day    Time  6    Period  Weeks    Status  New    Target Date  05/11/19      PT SHORT TERM GOAL #2   Title  Pt will be ind with initial HEP  Time  6    Period  Weeks    Status  New    Target Date  05/11/19      PT SHORT TERM GOAL #3   Title  Pt will be able to perform 5 x sit to stand with no UE assistance    Time  6    Period  Weeks    Status  New    Target Date  05/11/19        PT Long Term Goals - 03/30/19 1544      PT LONG TERM GOAL #1   Title  Pt will report at least 50% less urinary and fecal leakage     Time  12    Period  Weeks    Status  New    Target Date  06/22/19      PT LONG TERM GOAL #2   Title  pt will be ind with advanced HEP    Time  12    Period  Weeks    Status  New    Target Date  06/22/19      PT LONG TERM GOAL #3   Title  Pt will report needing only 1 pad/depends per day    Baseline  3-4    Time  12    Period  Weeks    Status  New    Target Date  06/22/19      PT LONG TERM GOAL #4   Title  Pt will demonstrate 5x sit to stand in <13 seconds for reduced risk of falls    Time  12    Period  Weeks    Status  New    Target Date  06/22/19            Plan - 04/08/19 1649    Clinical Impression Statement  Pt got better at feeling  the muscles engaged.  Biofeedback is still necessary to help her with this.  She was able to contract and relax in seated postions.  Pt unable to relax in standing and will continue to benefit from skilled PT to address these impairments and work on improved endruance    Personal Factors and Comorbidities  Age;Time since onset of injury/illness/exacerbation;Comorbidity 2    PT Treatment/Interventions  ADLs/Self Care Home Management;Biofeedback;Cryotherapy;Electrical Stimulation;Moist Heat;Neuromuscular re-education;Therapeutic exercise;Therapeutic activities;Taping;Dry needling;Passive range of motion;Patient/family education;Manual techniques    PT Next Visit Plan  biofeedback in standing; internal STM tapping and stroking to engage circular contraction as needed; prone kegel with hip IR    PT Home Exercise Plan  Access Code: [email or web address]    Consulted and Agree with Plan of Care  Patient       Patient will benefit from skilled therapeutic intervention in order to improve the following deficits and impairments:  Abnormal gait, Decreased coordination, Pain, Postural dysfunction, Decreased strength  Visit Diagnosis: Repeated falls  Muscle weakness (generalized)  Unspecified lack of coordination     Problem List Patient Active Problem List   Diagnosis Date Noted  . Dark stools 03/17/2017  . SIADH (syndrome of inappropriate ADH production) ([place]) 10/10/2016  . Gastroesophageal reflux disease 08/06/2016  . PCP Notes >>>>>>>>>>>>>>>>> 09/21/2014  . Rotator cuff tear arthropathy 04/21/2014  . Abnormal MRI, shoulder 12/01/2013  . 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 Cervical radiculitis 11/10/2013  . Bursitis of right shoulder 08/19/2013  . Primary localized osteoarthrosis, lower leg 08/19/2013  . Vitamin B 12 deficiency 05/20/2013  . OSA (obstructive sleep apnea) 02/13/2012  .  Pulmonary nodule, last  CT 2016, stable, no f/u 01/28/2012  . Dementia ([place]) 05/29/2011  . Fatty liver  02/27/2011  . Annual physical exam 11/26/2010  . Osteopenia 10/09/2007  . COLONIC POLYPS 08/22/2006  . DM II (diabetes mellitus, type II), controlled ([place]) 05/19/2006  . Hyperlipidemia 05/19/2006  . Depression 05/19/2006  . SYNDROME, RESTLESS LEGS 05/19/2006  . Essential hypertension 05/19/2006  . IBS -- constipation 05/19/2006    Jule Ser, PT 04/08/2019, 4:54 PM  [facility] [address], [place], [place], [address] Phone: [phone]   Fax:  [phone]  Name: ERILYN SCHNURR MRN: [id number] Date of Birth: [birth year]

## 2019-04-13 ENCOUNTER — Encounter: Payer: Self-pay | Admitting: Physical Therapy

## 2019-04-13 ENCOUNTER — Ambulatory Visit: Payer: Medicare Other | Admitting: Physical Therapy

## 2019-04-13 ENCOUNTER — Other Ambulatory Visit: Payer: Self-pay

## 2019-04-13 DIAGNOSIS — R279 Unspecified lack of coordination: Secondary | ICD-10-CM

## 2019-04-13 DIAGNOSIS — R296 Repeated falls: Secondary | ICD-10-CM

## 2019-04-13 DIAGNOSIS — M6281 Muscle weakness (generalized): Secondary | ICD-10-CM

## 2019-04-13 NOTE — Therapy (Signed)
[facility] [address] [place], [place], [address] Phone: [phone]   Fax:  [phone]  Physical Therapy Treatment  Patient Details  Name: [name] MRN: [id number] Date of Birth: [birth year] Referring Provider (PT): Kathie Rhodes, MD   Encounter Date: 04/13/2019  PT End of Session - 04/13/19 1153    Visit Number  4    Date for PT Re-Evaluation  06/22/19    PT Start Time  1153   arrive late   PT Stop Time  1231    PT Time Calculation (min)  38 min    Activity Tolerance  Patient tolerated treatment well    Behavior During Therapy  [facility] for tasks assessed/performed       Past Medical History:  Diagnosis Date  . Adenomatous colon polyp 03/1988  . Anemia    borderline  . Arthritis    R shoulder - degenerative   . Cataract    beginning stages  . Chest pain     Sept 2011: stress test neg  . Depression    sees Dr.Cotle  . Diabetes mellitus    dr Buddy Duty  . Diverticulosis   . Fatty liver    Increased LFTs, saw GI 06/2011, likely from fatty liver   . GERD (gastroesophageal reflux disease)   . History of hiatal hernia   . Hyperlipemia   . Hypertension   . IBS (irritable bowel syndrome)    and dyspepsia  . Memory impairment 08/2014   mild cognitive impairment vs. mild dementia, reommended reinstating of cholinesterase inhibitor   . Osteopenia    dexa 06/2007 and 10/11 Rx [place] vitamin d  . RLS (restless legs syndrome)    h/o- no longer using Requip    Past Surgical History:  Procedure Laterality Date  . BREAST BIOPSY Left 10/11/2015   fibroadenoma w/ calcifications, no evidence of malignancy, recommend mammogram repeat-6 mnths  . POLYPECTOMY    . REVERSE SHOULDER ARTHROPLASTY Right 04/21/2014   Procedure: REVERSE SHOULDER ARTHROPLASTY;  Surgeon: Tania Ade, MD;  Location: [facility];  Service: Orthopedics;  Laterality: Right;  Right reverse total shoulder replacement  . TONSILLECTOMY    . UTERINE  FIBROID EMBOLIZATION     90s    There were no vitals filed for this visit.  Subjective Assessment - 04/13/19 1205    Subjective  I am having less leakage.  Still don't have control when standing up from the chair or sofa.  Reports a little pain in left knee when standing today                       [facility] Adult PT Treatment/Exercise - 04/13/19 0001      Exercises   Exercises  Lumbar      Lumbar Exercises: Stretches   Active Hamstring Stretch  Right;Left;2 reps;20 seconds    Gastroc Stretch  Right;Left;2 reps;20 seconds      Lumbar Exercises: Aerobic   Nustep  L3 x 10 min    PT present for status update and to encourage core bracing     Lumbar Exercises: Standing   Row  Strengthening;Both;20 reps;Theraband    Theraband Level (Row)  Level 3 (Green)    Shoulder Extension  Strengthening;Both;10 reps;Theraband    Theraband Level (Shoulder Extension)  Level 3 (Green)      Lumbar Exercises: Seated   Other Seated Lumbar Exercises  ball squeeze    Other Seated Lumbar Exercises  sit to stand  from high surface with kegel/bracing during movement - educated and performed 2 x 10 - less knee pain after doing joint mobs      Manual Therapy   Manual Therapy  Joint mobilization    Manual therapy comments  patella mobs med/lat; sup/inferior               PT Short Term Goals - 03/30/19 1546      PT SHORT TERM GOAL #1   Title  Pt will report only needing 2 pad/depends per day    Time  6    Period  Weeks    Status  New    Target Date  05/11/19      PT SHORT TERM GOAL #2   Title  Pt will be ind with initial HEP    Time  6    Period  Weeks    Status  New    Target Date  05/11/19      PT SHORT TERM GOAL #3   Title  Pt will be able to perform 5 x sit to stand with no UE assistance    Time  6    Period  Weeks    Status  New    Target Date  05/11/19        PT Long Term Goals - 03/30/19 1544      PT LONG TERM GOAL #1   Title  Pt will report at least 50%  less urinary and fecal leakage     Time  12    Period  Weeks    Status  New    Target Date  06/22/19      PT LONG TERM GOAL #2   Title  pt will be ind with advanced HEP    Time  12    Period  Weeks    Status  New    Target Date  06/22/19      PT LONG TERM GOAL #3   Title  Pt will report needing only 1 pad/depends per day    Baseline  3-4    Time  12    Period  Weeks    Status  New    Target Date  06/22/19      PT LONG TERM GOAL #4   Title  Pt will demonstrate 5x sit to stand in <13 seconds for reduced risk of falls    Time  12    Period  Weeks    Status  New    Target Date  06/22/19            Plan - 04/13/19 1209    Clinical Impression Statement  Progressing to sitting and standing exericses.  No biofeedback used today due to patient ability to feel the muscles engaging more. She has difficutly feeling muscles with some of the exercises.  Pt was able to do sit to stand better after some patella mobs which helps with more balanced core and pelvic floor activation. Pt    PT Treatment/Interventions  ADLs/Self Care Home Management;Biofeedback;Cryotherapy;Electrical Stimulation;Moist Heat;Neuromuscular re-education;Therapeutic exercise;Therapeutic activities;Taping;Dry needling;Passive range of motion;Patient/family education;Manual techniques    PT Next Visit Plan  biofeedback in standing; sit to stand; f/u on knee pain and Lt LE hip strength, thoracic ext    PT Home Exercise Plan  Access Code: [email or web address]    Consulted and Agree with Plan of Care  Patient       Patient will benefit from skilled therapeutic intervention in order to improve  the following deficits and impairments:  Abnormal gait, Decreased coordination, Pain, Postural dysfunction, Decreased strength  Visit Diagnosis: Repeated falls  Muscle weakness (generalized)  Unspecified lack of coordination     Problem List Patient Active Problem List   Diagnosis Date Noted  . Dark stools 03/17/2017  . SIADH  (syndrome of inappropriate ADH production) ([place]) 10/10/2016  . Gastroesophageal reflux disease 08/06/2016  . PCP Notes >>>>>>>>>>>>>>>>> 09/21/2014  . Rotator cuff tear arthropathy 04/21/2014  . Abnormal MRI, shoulder 12/01/2013  . 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 Cervical radiculitis 11/10/2013  . Bursitis of right shoulder 08/19/2013  . Primary localized osteoarthrosis, lower leg 08/19/2013  . Vitamin B 12 deficiency 05/20/2013  . OSA (obstructive sleep apnea) 02/13/2012  . Pulmonary nodule, last  CT 2016, stable, no f/u 01/28/2012  . Dementia ([place]) 05/29/2011  . Fatty liver 02/27/2011  . Annual physical exam 11/26/2010  . Osteopenia 10/09/2007  . COLONIC POLYPS 08/22/2006  . DM II (diabetes mellitus, type II), controlled ([place]) 05/19/2006  . Hyperlipidemia 05/19/2006  . Depression 05/19/2006  . SYNDROME, RESTLESS LEGS 05/19/2006  . Essential hypertension 05/19/2006  . IBS -- constipation 05/19/2006    Jule Ser, PT 04/13/2019, 3:29 PM  [facility] [address] [place], [place], [address] Phone: [phone]   Fax:  [phone]  Name: SYLVIANA SIRMON MRN: [id number] Date of Birth: Dec 29, 1945

## 2019-04-15 ENCOUNTER — Ambulatory Visit: Payer: Medicare Other | Admitting: Physical Therapy

## 2019-04-15 ENCOUNTER — Other Ambulatory Visit: Payer: Self-pay

## 2019-04-15 ENCOUNTER — Encounter: Payer: Self-pay | Admitting: Physical Therapy

## 2019-04-15 DIAGNOSIS — R296 Repeated falls: Secondary | ICD-10-CM | POA: Diagnosis not present

## 2019-04-15 DIAGNOSIS — R279 Unspecified lack of coordination: Secondary | ICD-10-CM

## 2019-04-15 DIAGNOSIS — M6281 Muscle weakness (generalized): Secondary | ICD-10-CM

## 2019-04-15 NOTE — Therapy (Signed)
[facility] [address], [place], [place], [address] Phone: [phone]   Fax:  [phone]  Physical Therapy Treatment  Patient Details  Name: [name] MRN: [id number] Date of Birth: 1945-03-01 Referring Provider (PT): Kathie Rhodes, MD   Encounter Date: 04/15/2019  PT End of Session - 04/15/19 1455    Visit Number  5    Date for PT Re-Evaluation  06/22/19    PT Start Time  1453   arrived late   PT Stop Time  1528    PT Time Calculation (min)  35 min    Activity Tolerance  Patient tolerated treatment well    Behavior During Therapy  [facility] for tasks assessed/performed       Past Medical History:  Diagnosis Date  . Adenomatous colon polyp 03/1988  . Anemia    borderline  . Arthritis    R shoulder - degenerative   . Cataract    beginning stages  . Chest pain     Sept 2011: stress test neg  . Depression    sees Dr.Cotle  . Diabetes mellitus    dr Buddy Duty  . Diverticulosis   . Fatty liver    Increased LFTs, saw GI 06/2011, likely from fatty liver   . GERD (gastroesophageal reflux disease)   . History of hiatal hernia   . Hyperlipemia   . Hypertension   . IBS (irritable bowel syndrome)    and dyspepsia  . Memory impairment 08/2014   mild cognitive impairment vs. mild dementia, reommended reinstating of cholinesterase inhibitor   . Osteopenia    dexa 06/2007 and 10/11 Rx CA vitamin d  . RLS (restless legs syndrome)    h/o- no longer using Requip    Past Surgical History:  Procedure Laterality Date  . BREAST BIOPSY Left 10/11/2015   fibroadenoma w/ calcifications, no evidence of malignancy, recommend mammogram repeat-6 mnths  . POLYPECTOMY    . REVERSE SHOULDER ARTHROPLASTY Right 04/21/2014   Procedure: REVERSE SHOULDER ARTHROPLASTY;  Surgeon: Tania Ade, MD;  Location: [place];  Service: Orthopedics;  Laterality: Right;  Right reverse total shoulder replacement  . TONSILLECTOMY    . UTERINE  FIBROID EMBOLIZATION     90s    There were no vitals filed for this visit.  Subjective Assessment - 04/15/19 1718    Subjective  It is better.  I only changed one time today    Currently in Pain?  No/denies                       [facility] Adult PT Treatment/Exercise - 04/15/19 0001      Lumbar Exercises: Standing   Other Standing Lumbar Exercises  hip abduction and toe taps on step  - 10x each      Lumbar Exercises: Seated   Long Arc Quad on Chair  Strengthening;Both;20 reps    LAQ on Chair Limitations  ball squeeze      Lumbar Exercises: Supine   Clam  20 reps    Clam Limitations  red    Bent Knee Raise  20 reps    Bent Knee Raise Limitations  red band    Bridge  15 reps    Bridge Limitations  mini               PT Short Term Goals - 04/15/19 1714      PT SHORT TERM GOAL #1   Title  Pt will report only  needing 2 pad/depends per day    Baseline  only changed 1x today    Status  Partially Met      PT SHORT TERM GOAL #2   Title  Pt will be ind with initial HEP    Status  Achieved      PT SHORT TERM GOAL #3   Title  Pt will be able to perform 5 x sit to stand with no UE assistance    Status  On-going        PT Long Term Goals - 03/30/19 1544      PT LONG TERM GOAL #1   Title  Pt will report at least 50% less urinary and fecal leakage     Time  12    Period  Weeks    Status  New    Target Date  06/22/19      PT LONG TERM GOAL #2   Title  pt will be ind with advanced HEP    Time  12    Period  Weeks    Status  New    Target Date  06/22/19      PT LONG TERM GOAL #3   Title  Pt will report needing only 1 pad/depends per day    Baseline  3-4    Time  12    Period  Weeks    Status  New    Target Date  06/22/19      PT LONG TERM GOAL #4   Title  Pt will demonstrate 5x sit to stand in <13 seconds for reduced risk of falls    Time  12    Period  Weeks    Status  New    Target Date  06/22/19            Plan - 04/15/19 1506     Clinical Impression Statement  Pt did not use biofeedback today but was able to do okay with exercises using TC outside of clothing.  Pt was able to progress to standing exercises.  She did have mild knee pain but was able to complete exercises.  Pt needed some cues to activate pelvic floor correctly in supine and for slowing down the exercises for allowing her muscles to engage.  She will benefit from skilled PT to continue to porgress exercises and reduce leakage.    PT Treatment/Interventions  ADLs/Self Care Home Management;Biofeedback;Cryotherapy;Electrical Stimulation;Moist Heat;Neuromuscular re-education;Therapeutic exercise;Therapeutic activities;Taping;Dry needling;Passive range of motion;Patient/family education;Manual techniques    PT Next Visit Plan  biofeedback in standing; sit to stand; f/u on knee pain and Lt LE hip strength, thoracic ext    PT Home Exercise Plan  Access Code: [email or web address]    Consulted and Agree with Plan of Care  Patient       Patient will benefit from skilled therapeutic intervention in order to improve the following deficits and impairments:  Abnormal gait, Decreased coordination, Pain, Postural dysfunction, Decreased strength  Visit Diagnosis: Repeated falls  Muscle weakness (generalized)  Unspecified lack of coordination     Problem List Patient Active Problem List   Diagnosis Date Noted  . Dark stools 03/17/2017  . SIADH (syndrome of inappropriate ADH production) ([place]) 10/10/2016  . Gastroesophageal reflux disease 08/06/2016  . PCP Notes >>>>>>>>>>>>>>>>> 09/21/2014  . Rotator cuff tear arthropathy 04/21/2014  . Abnormal MRI, shoulder 12/01/2013  . 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 Cervical radiculitis 11/10/2013  . Bursitis of right shoulder 08/19/2013  . Primary localized osteoarthrosis, lower  leg 08/19/2013  . Vitamin B 12 deficiency 05/20/2013  . OSA (obstructive sleep apnea) 02/13/2012  . Pulmonary nodule, last  CT 2016, stable,  no f/u 01/28/2012  . Dementia ([place]) 05/29/2011  . Fatty liver 02/27/2011  . Annual physical exam 11/26/2010  . Osteopenia 10/09/2007  . COLONIC POLYPS 08/22/2006  . DM II (diabetes mellitus, type II), controlled ([place]) 05/19/2006  . Hyperlipidemia 05/19/2006  . Depression 05/19/2006  . SYNDROME, RESTLESS LEGS 05/19/2006  . Essential hypertension 05/19/2006  . IBS -- constipation 05/19/2006    [name], PT 04/15/2019, 5:18 PM  [facility] [address] [place], [place], [address] Phone: [phone]   Fax:  [phone]  Name: [name] MRN: [id number] Date of Birth: 31-Mar-1945

## 2019-04-20 ENCOUNTER — Ambulatory Visit: Payer: Medicare Other | Admitting: Physical Therapy

## 2019-04-20 ENCOUNTER — Ambulatory Visit: Payer: Medicare Other | Admitting: Podiatry

## 2019-04-20 ENCOUNTER — Other Ambulatory Visit: Payer: Self-pay

## 2019-04-20 ENCOUNTER — Telehealth: Payer: Self-pay | Admitting: Physical Therapy

## 2019-04-20 NOTE — Telephone Encounter (Signed)
Pt was called due to no show and she was confused about her appointment time.  States she thought she had changed it.  Pt was reminded of her next appointment in 2 days and she states she will be able to make it to the next appointment.  Gustavus Bryant, PT 04/20/19 12:10 PM

## 2019-04-21 ENCOUNTER — Ambulatory Visit: Payer: Medicare Other | Admitting: Internal Medicine

## 2019-04-22 ENCOUNTER — Encounter: Payer: Self-pay | Admitting: Physical Therapy

## 2019-04-22 ENCOUNTER — Other Ambulatory Visit: Payer: Self-pay

## 2019-04-22 ENCOUNTER — Ambulatory Visit: Payer: Medicare Other | Admitting: Physical Therapy

## 2019-04-22 DIAGNOSIS — M6281 Muscle weakness (generalized): Secondary | ICD-10-CM

## 2019-04-22 DIAGNOSIS — R296 Repeated falls: Secondary | ICD-10-CM | POA: Diagnosis not present

## 2019-04-22 DIAGNOSIS — R279 Unspecified lack of coordination: Secondary | ICD-10-CM

## 2019-04-22 NOTE — Therapy (Signed)
[facility] [address] [place], [place], [address] Phone: [phone]   Fax:  [phone]  Physical Therapy Treatment  Patient Details  Name: [name] MRN: [id number] Date of Birth: Oct 25, 1945 Referring Provider (PT): Kathie Rhodes, MD   Encounter Date: 04/22/2019  PT End of Session - 04/22/19 1501    Visit Number  6    Date for PT Re-Evaluation  06/22/19    PT Start Time  [id number]    PT Stop Time  1529    PT Time Calculation (min)  38 min    Activity Tolerance  Patient tolerated treatment well    Behavior During Therapy  [facility] for tasks assessed/performed       Past Medical History:  Diagnosis Date  . Adenomatous colon polyp 03/1988  . Anemia    borderline  . Arthritis    R shoulder - degenerative   . Cataract    beginning stages  . Chest pain     Sept 2011: stress test neg  . Depression    sees Dr.Cotle  . Diabetes mellitus    dr Buddy Duty  . Diverticulosis   . Fatty liver    Increased LFTs, saw GI 06/2011, likely from fatty liver   . GERD (gastroesophageal reflux disease)   . History of hiatal hernia   . Hyperlipemia   . Hypertension   . IBS (irritable bowel syndrome)    and dyspepsia  . Memory impairment 08/2014   mild cognitive impairment vs. mild dementia, reommended reinstating of cholinesterase inhibitor   . Osteopenia    dexa 06/2007 and 10/11 Rx CA vitamin d  . RLS (restless legs syndrome)    h/o- no longer using Requip    Past Surgical History:  Procedure Laterality Date  . BREAST BIOPSY Left 10/11/2015   fibroadenoma w/ calcifications, no evidence of malignancy, recommend mammogram repeat-6 mnths  . POLYPECTOMY    . REVERSE SHOULDER ARTHROPLASTY Right 04/21/2014   Procedure: REVERSE SHOULDER ARTHROPLASTY;  Surgeon: Tania Ade, MD;  Location: [facility];  Service: Orthopedics;  Laterality: Right;  Right reverse total shoulder replacement  . TONSILLECTOMY    . UTERINE FIBROID  EMBOLIZATION     90s    There were no vitals filed for this visit.  Subjective Assessment - 04/22/19 1500    Subjective  Pt states no accidents of BM and a couple of times had bladder leakage but not bad and only 2x.  Pt reports she is not ready to give up the depends but maybe getting close.    Currently in Pain?  No/denies                       [facility] Adult PT Treatment/Exercise - 04/22/19 0001      Neuro Re-ed    Neuro Re-ed Details   TC with supine exercises to acitvate TrA and pelvic floor; knees together hip IR for reduced co-contraction - 3 x 10       Lumbar Exercises: Machines for Strengthening   Leg Press  bilat 70lb; single 35# - 15 x each      Lumbar Exercises: Standing   Other Standing Lumbar Exercises  single leg stand on blue pad - hip abd 5 x with one UE support               PT Short Term Goals - 04/22/19 1535      PT SHORT TERM GOAL #1   Title  Pt will report only needing 2 pad/depends per day    Status  Achieved      PT SHORT TERM GOAL #2   Title  Pt will be ind with initial HEP    Status  Achieved      PT SHORT TERM GOAL #3   Title  Pt will be able to perform 5 x sit to stand with no UE assistance    Status  On-going        PT Long Term Goals - 03/30/19 1544      PT LONG TERM GOAL #1   Title  Pt will report at least 50% less urinary and fecal leakage     Time  12    Period  Weeks    Status  New    Target Date  06/22/19      PT LONG TERM GOAL #2   Title  pt will be ind with advanced HEP    Time  12    Period  Weeks    Status  New    Target Date  06/22/19      PT LONG TERM GOAL #3   Title  Pt will report needing only 1 pad/depends per day    Baseline  3-4    Time  12    Period  Weeks    Status  New    Target Date  06/22/19      PT LONG TERM GOAL #4   Title  Pt will demonstrate 5x sit to stand in <13 seconds for reduced risk of falls    Time  12    Period  Weeks    Status  New    Target Date  06/22/19             Plan - 04/22/19 1535    Clinical Impression Statement  Pt was given tactile cues to supine externally.  She gets greater muscle activity in pelvic floor when cued to activate her TrA.  She does better when also receiving TC to abdomen for TrA activation.  Pt has little sensation to the pelvic floor, so today's session focused on activating core and gluteals to overflow into pelvic floor activity.  Pt was educated on how to use her estim machine at home.    PT Treatment/Interventions  ADLs/Self Care Home Management;Biofeedback;Cryotherapy;Electrical Stimulation;Moist Heat;Neuromuscular re-education;Therapeutic exercise;Therapeutic activities;Taping;Dry needling;Passive range of motion;Patient/family education;Manual techniques    PT Next Visit Plan  leg press; standing and posture exercises; add to HEP standing exercises; f/u on stim    PT Home Exercise Plan  Access Code: [email or web address]    Consulted and Agree with Plan of Care  Patient       Patient will benefit from skilled therapeutic intervention in order to improve the following deficits and impairments:  Abnormal gait, Decreased coordination, Pain, Postural dysfunction, Decreased strength  Visit Diagnosis: Repeated falls  Muscle weakness (generalized)  Unspecified lack of coordination     Problem List Patient Active Problem List   Diagnosis Date Noted  . Dark stools 03/17/2017  . SIADH (syndrome of inappropriate ADH production) ([place]) 10/10/2016  . Gastroesophageal reflux disease 08/06/2016  . PCP Notes >>>>>>>>>>>>>>>>> 09/21/2014  . Rotator cuff tear arthropathy 04/21/2014  . Abnormal MRI, shoulder 12/01/2013  . 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 Cervical radiculitis 11/10/2013  . Bursitis of right shoulder 08/19/2013  . Primary localized osteoarthrosis, lower leg 08/19/2013  . Vitamin B 12 deficiency 05/20/2013  . OSA (obstructive sleep apnea) 02/13/2012  .  Pulmonary nodule, last  CT 2016, stable, no  f/u 01/28/2012  . Dementia ([place]) 05/29/2011  . Fatty liver 02/27/2011  . Annual physical exam 11/26/2010  . Osteopenia 10/09/2007  . COLONIC POLYPS 08/22/2006  . DM II (diabetes mellitus, type II), controlled ([place]) 05/19/2006  . Hyperlipidemia 05/19/2006  . Depression 05/19/2006  . SYNDROME, RESTLESS LEGS 05/19/2006  . Essential hypertension 05/19/2006  . IBS -- constipation 05/19/2006    Jule Ser, PT 04/22/2019, 5:28 PM  [facility] [address] [place], [place], [address] Phone: [phone]   Fax:  [phone]  Name: JORGE LUMIA MRN: [id number] Date of Birth: 1945-06-30

## 2019-04-27 ENCOUNTER — Other Ambulatory Visit: Payer: Self-pay

## 2019-04-27 ENCOUNTER — Ambulatory Visit: Payer: Medicare Other | Admitting: Physical Therapy

## 2019-04-27 ENCOUNTER — Encounter: Payer: Self-pay | Admitting: Physical Therapy

## 2019-04-27 DIAGNOSIS — R296 Repeated falls: Secondary | ICD-10-CM

## 2019-04-27 DIAGNOSIS — R279 Unspecified lack of coordination: Secondary | ICD-10-CM

## 2019-04-27 DIAGNOSIS — M6281 Muscle weakness (generalized): Secondary | ICD-10-CM

## 2019-04-27 LAB — HEMOGLOBIN A1C: Hemoglobin A1C: 4.8

## 2019-04-27 LAB — HM DIABETES FOOT EXAM: HM Diabetic Foot Exam: NORMAL

## 2019-04-27 NOTE — Therapy (Signed)
[facility] [address], [place], [place], [address] Phone: [phone]   Fax:  [phone]  Physical Therapy Treatment  Patient Details  Name: [name] MRN: [id number] Date of Birth: 1945/05/09 Referring Provider (PT): Kathie Rhodes, MD   Encounter Date: 04/27/2019  PT End of Session - 04/27/19 1227    Visit Number  7    Date for PT Re-Evaluation  06/22/19    PT Start Time  1215    PT Stop Time  1255    PT Time Calculation (min)  40 min    Activity Tolerance  Patient tolerated treatment well    Behavior During Therapy  [facility] for tasks assessed/performed       Past Medical History:  Diagnosis Date  . Adenomatous colon polyp 03/1988  . Anemia    borderline  . Arthritis    R shoulder - degenerative   . Cataract    beginning stages  . Chest pain     Sept 2011: stress test neg  . Depression    sees Dr.Cotle  . Diabetes mellitus    dr Buddy Duty  . Diverticulosis   . Fatty liver    Increased LFTs, saw GI 06/2011, likely from fatty liver   . GERD (gastroesophageal reflux disease)   . History of hiatal hernia   . Hyperlipemia   . Hypertension   . IBS (irritable bowel syndrome)    and dyspepsia  . Memory impairment 08/2014   mild cognitive impairment vs. mild dementia, reommended reinstating of cholinesterase inhibitor   . Osteopenia    dexa 06/2007 and 10/11 Rx CA vitamin d  . RLS (restless legs syndrome)    h/o- no longer using Requip    Past Surgical History:  Procedure Laterality Date  . BREAST BIOPSY Left 10/11/2015   fibroadenoma w/ calcifications, no evidence of malignancy, recommend mammogram repeat-6 mnths  . POLYPECTOMY    . REVERSE SHOULDER ARTHROPLASTY Right 04/21/2014   Procedure: REVERSE SHOULDER ARTHROPLASTY;  Surgeon: Tania Ade, MD;  Location: [facility];  Service: Orthopedics;  Laterality: Right;  Right reverse total shoulder replacement  . TONSILLECTOMY    . UTERINE FIBROID  EMBOLIZATION     90s    There were no vitals filed for this visit.  Subjective Assessment - 04/27/19 1259    Subjective  I feel almost 50% better, not leaking at night and down to 2 depends perday.  Knees hurt when standing to sitting, forgot to take medicine that I usually take this morning    Currently in Pain?  No/denies                       [facility] Adult PT Treatment/Exercise - 04/27/19 0001      Lumbar Exercises: Aerobic   Nustep  L3 x 5 min       Lumbar Exercises: Machines for Strengthening   Leg Press  bilat 50lb 10x with eccentric pelvic floor - cue to slowly lower      Lumbar Exercises: Standing   Other Standing Lumbar Exercises  hip series 10x each side 2lb    Other Standing Lumbar Exercises  mini squat 10x bilat UE support      Lumbar Exercises: Seated   Long Arc Quad on Chair  Strengthening;Both;20 reps    LAQ on Chair Limitations  ball squeezes and 2lb    Hip Flexion on Ball  Strengthening;Both;20 reps    Hip Flexion on Ball Limitations  in chair 2lb               PT Short Term Goals - 04/27/19 1220      PT SHORT TERM GOAL #3   Title  Pt will be able to perform 5 x sit to stand with no UE assistance    Status  Achieved        PT Long Term Goals - 04/27/19 1221      PT LONG TERM GOAL #1   Title  Pt will report at least 50% less urinary and fecal leakage     Baseline  definitely better, about 50% but maybe not quite    Status  On-going      PT LONG TERM GOAL #2   Title  pt will be ind with advanced HEP    Status  On-going      PT LONG TERM GOAL #3   Title  Pt will report needing only 1 pad/depends per day    Baseline  2/day and still putting a chuck pad    Status  On-going      PT LONG TERM GOAL #4   Title  Pt will demonstrate 5x sit to stand in <13 seconds for reduced risk of falls    Baseline  30 sec            Plan - 04/27/19 1233    Clinical Impression Statement  Pt is making good progress.  She was able to  perform 5x sit to stand in 30 sec without using UE.  She was having a lot of knee pain today when doing the 5XSTS.  Pt was able to progress exercises today with added weight and standing.  Overall improved with almost 50% less leakage.  Pt will continue to benefit from skilled PT to keep progressing strength for maximum function and reduced risk of falling.    Personal Factors and Comorbidities  Age;Time since onset of injury/illness/exacerbation;Comorbidity 2    Comorbidities  impaired memory; knee OA    PT Treatment/Interventions  ADLs/Self Care Home Management;Biofeedback;Cryotherapy;Electrical Stimulation;Moist Heat;Neuromuscular re-education;Therapeutic exercise;Therapeutic activities;Taping;Dry needling;Passive range of motion;Patient/family education;Manual techniques    PT Next Visit Plan  leg press; standing and posture exercises; add to HEP; standing exercises    PT Home Exercise Plan  Access Code: [email or web address]    Consulted and Agree with Plan of Care  Patient       Patient will benefit from skilled therapeutic intervention in order to improve the following deficits and impairments:  Abnormal gait, Decreased coordination, Pain, Postural dysfunction, Decreased strength  Visit Diagnosis: Repeated falls  Muscle weakness (generalized)  Unspecified lack of coordination     Problem List Patient Active Problem List   Diagnosis Date Noted  . Dark stools 03/17/2017  . SIADH (syndrome of inappropriate ADH production) ([place]) 10/10/2016  . Gastroesophageal reflux disease 08/06/2016  . PCP Notes >>>>>>>>>>>>>>>>> 09/21/2014  . Rotator cuff tear arthropathy 04/21/2014  . Abnormal MRI, shoulder 12/01/2013  . 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 Cervical radiculitis 11/10/2013  . Bursitis of right shoulder 08/19/2013  . Primary localized osteoarthrosis, lower leg 08/19/2013  . Vitamin B 12 deficiency 05/20/2013  . OSA (obstructive sleep apnea) 02/13/2012  . Pulmonary nodule, last   CT 2016, stable, no f/u 01/28/2012  . Dementia ([place]) 05/29/2011  . Fatty liver 02/27/2011  . Annual physical exam 11/26/2010  . Osteopenia 10/09/2007  . COLONIC POLYPS 08/22/2006  . DM II (diabetes mellitus, type II), controlled ([place]) 05/19/2006  .  Hyperlipidemia 05/19/2006  . Depression 05/19/2006  . SYNDROME, RESTLESS LEGS 05/19/2006  . Essential hypertension 05/19/2006  . IBS -- constipation 05/19/2006    Jule Ser, PT 04/27/2019, 1:00 PM  [facility] [address], [place], [place], [address] Phone: [phone]   Fax:  [phone]  Name: [name] MRN: [id number] Date of Birth: 04/19/1945

## 2019-04-28 ENCOUNTER — Ambulatory Visit: Payer: Medicare Other | Admitting: Internal Medicine

## 2019-04-29 ENCOUNTER — Encounter: Payer: Self-pay | Admitting: Physical Therapy

## 2019-04-29 ENCOUNTER — Other Ambulatory Visit: Payer: Self-pay | Admitting: Internal Medicine

## 2019-04-29 ENCOUNTER — Other Ambulatory Visit: Payer: Self-pay

## 2019-04-29 ENCOUNTER — Ambulatory Visit: Payer: Medicare Other | Admitting: Physical Therapy

## 2019-04-29 DIAGNOSIS — R296 Repeated falls: Secondary | ICD-10-CM | POA: Diagnosis not present

## 2019-04-29 DIAGNOSIS — M6281 Muscle weakness (generalized): Secondary | ICD-10-CM

## 2019-04-29 DIAGNOSIS — R279 Unspecified lack of coordination: Secondary | ICD-10-CM

## 2019-04-29 NOTE — Therapy (Signed)
[facility] [address] [place], [place], [address] Phone: [phone]   Fax:  [phone]  Physical Therapy Treatment  Patient Details  Name: [name] MRN: [id number] Date of Birth: 1945/06/05 Referring Provider (PT): Kathie Rhodes, MD   Encounter Date: 04/29/2019  PT End of Session - 04/29/19 1415    Visit Number  8    Date for PT Re-Evaluation  06/22/19    PT Start Time  1411   arrived late   PT Stop Time  1449    PT Time Calculation (min)  38 min    Activity Tolerance  Patient tolerated treatment well    Behavior During Therapy  [facility] for tasks assessed/performed       Past Medical History:  Diagnosis Date  . Adenomatous colon polyp 03/1988  . Anemia    borderline  . Arthritis    R shoulder - degenerative   . Cataract    beginning stages  . Chest pain     Sept 2011: stress test neg  . Depression    sees Dr.Cotle  . Diabetes mellitus    dr Buddy Duty  . Diverticulosis   . Fatty liver    Increased LFTs, saw GI 06/2011, likely from fatty liver   . GERD (gastroesophageal reflux disease)   . History of hiatal hernia   . Hyperlipemia   . Hypertension   . IBS (irritable bowel syndrome)    and dyspepsia  . Memory impairment 08/2014   mild cognitive impairment vs. mild dementia, reommended reinstating of cholinesterase inhibitor   . Osteopenia    dexa 06/2007 and 10/11 Rx CA vitamin d  . RLS (restless legs syndrome)    h/o- no longer using Requip    Past Surgical History:  Procedure Laterality Date  . BREAST BIOPSY Left 10/11/2015   fibroadenoma w/ calcifications, no evidence of malignancy, recommend mammogram repeat-6 mnths  . POLYPECTOMY    . REVERSE SHOULDER ARTHROPLASTY Right 04/21/2014   Procedure: REVERSE SHOULDER ARTHROPLASTY;  Surgeon: Tania Ade, MD;  Location: [facility];  Service: Orthopedics;  Laterality: Right;  Right reverse total shoulder replacement  . TONSILLECTOMY    . UTERINE  FIBROID EMBOLIZATION     90s    There were no vitals filed for this visit.  Subjective Assessment - 04/29/19 1421    Subjective  My Lt knee was worse today.    Currently in Pain?  Yes    Pain Score  7     Pain Location  Knee    Pain Orientation  Right;Left    Pain Descriptors / Indicators  Aching    Pain Type  Acute pain    Pain Onset  More than a month ago    Pain Frequency  Intermittent    Pain Relieving Factors  tylenol    Multiple Pain Sites  No                       [facility] Adult PT Treatment/Exercise - 04/29/19 0001      Lumbar Exercises: Aerobic   Nustep  L1 x 2 min - stopped due to Lt knee      Lumbar Exercises: Machines for Strengthening   Leg Press  bilat 70lb 20x      Lumbar Exercises: Standing   Shoulder Extension  Strengthening;Both;20 reps;Theraband    Theraband Level (Shoulder Extension)  Level 3 (Green)    Other Standing Lumbar Exercises  rotation with green band -  12 x each way      Lumbar Exercises: Seated   Long Arc Quad on Chair  Strengthening;Both;20 reps    LAQ on Chair Limitations  ball squeezes and 2.5lb    Hip Flexion on Ball  Strengthening;Both;20 reps    Hip Flexion on Ball Limitations  in chair 2.5lb    Other Seated Lumbar Exercises  clam green band - 20x 2 sec hold               PT Short Term Goals - 04/27/19 1220      PT SHORT TERM GOAL #3   Title  Pt will be able to perform 5 x sit to stand with no UE assistance    Status  Achieved        PT Long Term Goals - 04/27/19 1221      PT LONG TERM GOAL #1   Title  Pt will report at least 50% less urinary and fecal leakage     Baseline  definitely better, about 50% but maybe not quite    Status  On-going      PT LONG TERM GOAL #2   Title  pt will be ind with advanced HEP    Status  On-going      PT LONG TERM GOAL #3   Title  Pt will report needing only 1 pad/depends per day    Baseline  2/day and still putting a chuck pad    Status  On-going      PT LONG  TERM GOAL #4   Title  Pt will demonstrate 5x sit to stand in <13 seconds for reduced risk of falls    Baseline  30 sec            Plan - 04/29/19 1434    Clinical Impression Statement  Pt did well with exercises today.  She continues to have knee pain that is noticeable with sit to stand, but felt better after several reps on the leg press.  Pt needed cues for upright posture and demonstrated increased trunk flexion.  Held back on some of the standing exercises after increased knee pain with attempt to do step up.    PT Treatment/Interventions  ADLs/Self Care Home Management;Biofeedback;Cryotherapy;Electrical Stimulation;Moist Heat;Neuromuscular re-education;Therapeutic exercise;Therapeutic activities;Taping;Dry needling;Passive range of motion;Patient/family education;Manual techniques    PT Next Visit Plan  leg press; standing and posture exercises; add to HEP; standing exercises    PT Home Exercise Plan  Access Code: [email or web address]    Consulted and Agree with Plan of Care  Patient       Patient will benefit from skilled therapeutic intervention in order to improve the following deficits and impairments:  Abnormal gait, Decreased coordination, Pain, Postural dysfunction, Decreased strength  Visit Diagnosis: Repeated falls  Muscle weakness (generalized)  Unspecified lack of coordination     Problem List Patient Active Problem List   Diagnosis Date Noted  . Dark stools 03/17/2017  . SIADH (syndrome of inappropriate ADH production) ([place]) 10/10/2016  . Gastroesophageal reflux disease 08/06/2016  . PCP Notes >>>>>>>>>>>>>>>>> 09/21/2014  . Rotator cuff tear arthropathy 04/21/2014  . Abnormal MRI, shoulder 12/01/2013  . 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 Cervical radiculitis 11/10/2013  . Bursitis of right shoulder 08/19/2013  . Primary localized osteoarthrosis, lower leg 08/19/2013  . Vitamin B 12 deficiency 05/20/2013  . OSA (obstructive sleep apnea) 02/13/2012  .  Pulmonary nodule, last  CT 2016, stable, no f/u 01/28/2012  . Dementia ([place]) 05/29/2011  .  Fatty liver 02/27/2011  . Annual physical exam 11/26/2010  . Osteopenia 10/09/2007  . COLONIC POLYPS 08/22/2006  . DM II (diabetes mellitus, type II), controlled ([place]) 05/19/2006  . Hyperlipidemia 05/19/2006  . Depression 05/19/2006  . SYNDROME, RESTLESS LEGS 05/19/2006  . Essential hypertension 05/19/2006  . IBS -- constipation 05/19/2006    Jule Ser, PT 04/29/2019, 2:57 PM  [facility] [address] [place], [place], [address] Phone: [phone]   Fax:  [phone]  Name: [name] MRN: [id number] Date of Birth: [birth year]

## 2019-05-03 ENCOUNTER — Encounter: Payer: Self-pay | Admitting: Family Medicine

## 2019-05-03 ENCOUNTER — Telehealth: Payer: Self-pay

## 2019-05-03 ENCOUNTER — Other Ambulatory Visit: Payer: Self-pay

## 2019-05-03 ENCOUNTER — Ambulatory Visit (INDEPENDENT_AMBULATORY_CARE_PROVIDER_SITE_OTHER): Payer: Medicare Other | Admitting: Family Medicine

## 2019-05-03 VITALS — BP 143/83 | HR 61 | Temp 96.8°F | Resp 17 | Ht 66.0 in | Wt 181.0 lb

## 2019-05-03 DIAGNOSIS — R310 Gross hematuria: Secondary | ICD-10-CM | POA: Diagnosis not present

## 2019-05-03 DIAGNOSIS — R300 Dysuria: Secondary | ICD-10-CM

## 2019-05-03 DIAGNOSIS — E118 Type 2 diabetes mellitus with unspecified complications: Secondary | ICD-10-CM | POA: Diagnosis not present

## 2019-05-03 LAB — POCT URINALYSIS DIP (MANUAL ENTRY)
Glucose, UA: 500 mg/dL — AB
Nitrite, UA: POSITIVE — AB
Protein Ur, POC: >=300 mg/dL — AB
Spec Grav, UA: >=1.03 — AB (ref 1.010–1.025)
Urobilinogen, UA: >=8 E.U./dL — AB
pH, UA: 5 (ref 5.0–8.0)

## 2019-05-03 LAB — BASIC METABOLIC PANEL
BUN: 13 mg/dL (ref 6–23)
CO2: 28 mEq/L (ref 19–32)
Calcium: 9.4 mg/dL (ref 8.4–10.5)
Chloride: 99 mEq/L (ref 96–112)
Creatinine, Ser: 0.75 mg/dL (ref 0.40–1.20)
GFR: 75.5 mL/min (ref >60.00)
Glucose, Bld: 101 mg/dL — ABNORMAL HIGH (ref 70–99)
Potassium: 3.6 mEq/L (ref 3.5–5.1)
Sodium: 135 mEq/L (ref 135–145)

## 2019-05-03 LAB — HEMOGLOBIN A1C: Hgb A1c MFr Bld: 6.2 % (ref 4.6–6.5)

## 2019-05-03 MED ORDER — CIPROFLOXACIN HCL 500 MG PO TABS: 500.0000 mg | ORAL_TABLET | Freq: Two times a day (BID) | ORAL | 0 refills | Status: DC

## 2019-05-03 NOTE — Patient Instructions (Addendum)
It was good to see you today, I will be in touch with your lab results as soon as possible Please start on the antibiotic today- cipro- twice a day for a week Please contact me if you are not feeling better within 1-2 days - Sooner if worse.

## 2019-05-03 NOTE — Telephone Encounter (Signed)
Pt did not see [place] today.

## 2019-05-03 NOTE — Telephone Encounter (Signed)
Called patients daughter to explain to her today's visit and the plan-I know you did mention that she will need to see urology if the culture is negative however daughter states she already has been referred to urology by Dr Larose Kells and they did not find anything. Advised her that she will be called with results once we get them back.

## 2019-05-03 NOTE — Progress Notes (Addendum)
[facility] [place], [place], [place] [address] [phone]  Date:  05/03/2019   Name:  [name]   DOB:  Jan 12, 1945   MRN:  [id number]  PCP:  Colon Branch, MD    Chief Complaint: Urinary Frequency (incontinence, urgency, blood "clot" on pad)   History of Present Illness:  [name] is a 74 y.o. very pleasant female patient who presents with the following:  Patient who typically sees my partner Dr. Larose Kells, here today with concern of urinary symptoms I have seen her once in the past, in 2017 History of hypertension, diabetes, SIADH, sleep apnea, dementia, hyperlipidemia, former smoker  Today she has concern of possible UTI She has noted sx since yesterday- her sx are urgency and frequency as well as dysuria She had some blood in her urine this am; there is a small spot of blood on her incontinence pad Most recent urine culture from may 2020 did grow any significant bacteria  Lab Results  Component Value Date   HGBA1C 6.2 05/03/2019   Can do blood work today she would like-we will catch up on A1c and BMP  She has noted possibly some lower back pain, but no fever, no nausea or vomiting No significant abdominal discomfort She is using Pyridium OTC so her urine is orange in color  Patient Active Problem List   Diagnosis Date Noted  . Dark stools 03/17/2017  . SIADH (syndrome of inappropriate ADH production) ([place]) 10/10/2016  . Gastroesophageal reflux disease 08/06/2016  . PCP Notes >>>>>>>>>>>>>>>>> 09/21/2014  . Rotator cuff tear arthropathy 04/21/2014  . Abnormal MRI, shoulder 12/01/2013  . 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 Cervical radiculitis 11/10/2013  . Bursitis of right shoulder 08/19/2013  . Primary localized osteoarthrosis, lower leg 08/19/2013  . Vitamin B 12 deficiency 05/20/2013  . OSA (obstructive sleep apnea) 02/13/2012  . Pulmonary nodule, last  CT 2016, stable, no f/u  01/28/2012  . Dementia ([place]) 05/29/2011  . Fatty liver 02/27/2011  . Annual physical exam 11/26/2010  . Osteopenia 10/09/2007  . COLONIC POLYPS 08/22/2006  . DM II (diabetes mellitus, type II), controlled ([place]) 05/19/2006  . Hyperlipidemia 05/19/2006  . Depression 05/19/2006  . SYNDROME, RESTLESS LEGS 05/19/2006  . Essential hypertension 05/19/2006  . IBS -- constipation 05/19/2006    Past Medical History:  Diagnosis Date  . Adenomatous colon polyp 03/1988  . Anemia    borderline  . Arthritis    R shoulder - degenerative   . Cataract    beginning stages  . Chest pain     Sept 2011: stress test neg  . Depression    sees Dr.Cotle  . Diabetes mellitus    dr Buddy Duty  . Diverticulosis   . Fatty liver    Increased LFTs, saw GI 06/2011, likely from fatty liver   . GERD (gastroesophageal reflux disease)   . History of hiatal hernia   . Hyperlipemia   . Hypertension   . IBS (irritable bowel syndrome)    and dyspepsia  . Memory impairment 08/2014   mild cognitive impairment vs. mild dementia, reommended reinstating of cholinesterase inhibitor   . Osteopenia    dexa 06/2007 and 10/11 Rx CA vitamin d  . RLS (restless legs syndrome)    h/o- no longer using Requip    Past Surgical History:  Procedure Laterality Date  . BREAST BIOPSY Left 10/11/2015   fibroadenoma w/ calcifications, no evidence  of malignancy, recommend mammogram repeat-6 mnths  . POLYPECTOMY    . REVERSE SHOULDER ARTHROPLASTY Right 04/21/2014   Procedure: REVERSE SHOULDER ARTHROPLASTY;  Surgeon: Tania Ade, MD;  Location: [facility];  Service: Orthopedics;  Laterality: Right;  Right reverse total shoulder replacement  . TONSILLECTOMY    . UTERINE FIBROID EMBOLIZATION     90s    Social History   Tobacco Use  . Smoking status: Former Smoker    Packs/day: 2.00    Years: 10.00    Pack years: 20.00    Types: Cigarettes    Quit date: 11/26/1975    Years since quitting: 43.4  . Smokeless tobacco: Never Used  .  Tobacco comment: used to smoke 2 ppd  Substance Use Topics  . Alcohol use: Yes    Alcohol/week: 0.0 standard drinks    Comment: rarely  . Drug use: No    Family History  Problem Relation Age of Onset  . Lung cancer Mother        smoker  . Colon cancer Father        F dx in his 5s  . Ovarian cancer Paternal Aunt        ?  . Diabetes Other        aunts-uncles   . Stroke Other        aunts-uncles   . Heart attack Maternal Grandmother        60s  . Other Other        niece- glioblastoma  . Breast cancer Neg Hx   . Rectal cancer Neg Hx   . Stomach cancer Neg Hx   . Esophageal cancer Neg Hx     Allergies  Allergen Reactions  . Sulfa Antibiotics Nausea Only  . Trulicity [Dulaglutide] Nausea Only  . Penicillins Rash and Other (See Comments)    Has patient had a PCN reaction causing immediate rash, facial/tongue/throat swelling, SOB or lightheadedness with hypotension: No Has patient had a PCN reaction causing severe rash involving mucus membranes or skin necrosis: No Has patient had a PCN reaction that required hospitalization No Has patient had a PCN reaction occurring within the last 10 years: No If all of the above answers are "NO", then may proceed with Cephalosporin use.    Medication list has been reviewed and updated.  Current Outpatient Medications on File Prior to Visit  Medication Sig Dispense Refill  . ACCU-CHEK FASTCLIX LANCETS MISC daily. use as directed  4  . acetaminophen (TYLENOL) 325 MG tablet Take 650 mg by mouth every 6 (six) hours as needed for mild pain or headache.    Marland Kitchen aspirin (QC LO-DOSE ASPIRIN) 81 MG EC tablet Take 1 tablet (81 mg total) by mouth daily. 90 tablet 3  . atenolol (TENORMIN) 50 MG tablet Take 25 mg by mouth daily.    Marland Kitchen atorvastatin (LIPITOR) 40 MG tablet Take 1 tablet (40 mg total) by mouth daily. 90 tablet 3  . azelastine (ASTELIN) 0.1 % nasal spray Place 2 sprays into both nostrils at bedtime as needed for rhinitis. Use in each  nostril as directed 30 mL 3  . BD VEO INSULIN SYRINGE U/F 31G X 15/64" 1 ML MISC USE TO INJECT insulin EVERY DAY  11  . Cyanocobalamin (B-12) 1000 MCG TBCR Take 1 tablet by mouth daily. 90 tablet 3  . darifenacin (ENABLEX) 15 MG 24 hr tablet Take 1 tablet (15 mg total) by mouth daily. 90 tablet 3  . donepezil (ARICEPT) 10 MG tablet Take  2 tablets (20 mg total) by mouth at bedtime.    . Ferrous Fumarate-Vitamin C ER (FERRO-SEQUELS) 65-25 MG TBCR Take 1 tablet by mouth daily. 90 tablet 3  . gabapentin (NEURONTIN) 100 MG capsule Take 1 capsule (100 mg total) by mouth at bedtime. 90 capsule 1  . Insulin Detemir (LEVEMIR FLEXTOUCH) 100 UNIT/ML Pen Inject 42 Units into the skin at bedtime.     Marland Kitchen losartan (COZAAR) 25 MG tablet Take 1 tablet (25 mg total) by mouth every evening. 90 tablet 1  . memantine (NAMENDA) 10 MG tablet Take 10 mg by mouth 2 (two) times daily.  3  . metFORMIN (GLUCOPHAGE-XR) 500 MG 24 hr tablet TAKE 1 TABLET BY MOUTH EVERY MORNING WITH A MEAL    . Multiple Vitamins-Minerals (CENTRUM SILVER 50+WOMEN) TABS Take 1 tablet by mouth daily. 90 tablet 3  . ONE TOUCH ULTRA TEST test strip USE TO CHECK BLOOD SUGAR EVERY DAY  4  . pantoprazole (PROTONIX) 40 MG tablet Take 1 tablet (40 mg total) by mouth 2 (two) times daily before a meal. 180 tablet 3  . sertraline (ZOLOFT) 100 MG tablet TAKE 1 TABLET BY MOUTH EVERY DAY AND TAKE 1/2 TABLET AT BEDTIME  2  . sodium fluoride (PREVIDENT 5000 DRY MOUTH) 1.1 % GEL dental gel Place 1 application onto teeth at bedtime as needed (for teeth).     . traMADol (ULTRAM) 50 MG tablet Take 1 tablet (50 mg total) by mouth every 12 (twelve) hours as needed.     No current facility-administered medications on file prior to visit.    Review of Systems:  As per HPI- otherwise negative.   Physical Examination: Vitals:   05/03/19 1306  BP: (!) 143/83  Pulse: 61  Resp: 17  Temp: (!) 96.8 F (36 C)  SpO2: 96%   Vitals:   05/03/19 1306  Weight: 181 lb  (82.1 kg)  Height: 5\' 6"  (1.676 m)   Body mass index is 29.21 kg/m. Ideal Body Weight: Weight in (lb) to have BMI = 25: 154.6  GEN: no acute distress.  Overweight, looks well HEENT: Atraumatic, Normocephalic.  Ears and Nose: No external deformity. CV: RRR, No M/G/R. No JVD. No thrill. No extra heart sounds. PULM: CTA B, no wheezes, crackles, rhonchi. No retractions. No resp. distress. No accessory muscle use. ABD: S, NT, ND, +BS. No rebound. No HSM.  Belly is benign She notes mild left CVA tenderness EXTR: No c/c/e PSYCH: Normally interactive. Conversant.  Some forgetfulness is evident, she has taken notes to remind herself about her symptoms.  She does repeat herself on occasion  Results for orders placed or performed in visit on 05/03/19  Urine Culture   Specimen: Blood  Result Value Ref Range   MICRO NUMBER: [id number]    SPECIMEN QUALITY: Adequate    Sample Source NOT GIVEN    STATUS: FINAL    Result: No Growth   Basic metabolic panel  Result Value Ref Range   Sodium 135 135 - 145 mEq/L   Potassium 3.6 3.5 - 5.1 mEq/L   Chloride 99 96 - 112 mEq/L   CO2 28 19 - 32 mEq/L   Glucose, Bld 101 (H) 70 - 99 mg/dL   BUN 13 6 - 23 mg/dL   Creatinine, Ser 0.75 0.40 - 1.20 mg/dL   GFR 75.50 >60.00 mL/min   Calcium 9.4 8.4 - 10.5 mg/dL  Hemoglobin A1c  Result Value Ref Range   Hgb A1c MFr Bld 6.2 4.6 - 6.5 %  POCT urinalysis dipstick  Result Value Ref Range   Color, UA orange (A) yellow   Clarity, UA cloudy (A) clear   Glucose, UA =500 (A) negative mg/dL   Bilirubin, UA large (A) negative   Ketones, POC UA moderate (40) (A) negative mg/dL   Spec Grav, UA >=1.030 (A) 1.010 - 1.025   Blood, UA large (A) negative   pH, UA 5.0 5.0 - 8.0   Protein Ur, POC >=300 (A) negative mg/dL   Urobilinogen, UA >=8.0 (A) 0.2 or 1.0 E.U./dL   Nitrite, UA Positive (A) Negative   Leukocytes, UA Large (3+) (A) Negative    We did not have enough urine for micro today Urine culture is  sent Glucose in urine is likely due to Pyridium  Assessment and Plan: Dysuria - Plan: Urine Culture, POCT urinalysis dipstick, Basic metabolic panel, ciprofloxacin (CIPRO) 500 MG tablet  Controlled diabetes mellitus type 2 with complications, unspecified whether long term insulin use ([place]) - Plan: Basic metabolic panel, Hemoglobin A1c  Here today with dysuria, I am concerned that she may develop pyelonephritis due to mild backache.  We will treat her with ciprofloxacin.  I have explained to her why we have chosen this particular agent. Urine culture is pending, she is asked to let me know if not feeling better in the next 1 to 2 days-sooner if worse She does have blood in her urine, if urine culture is negative will need urology referral as she is a former smoker We will follow up on A1c today Moderate medical decision making today This visit occurred during the SARS-CoV-2 public health emergency.  Safety protocols were in place, including screening questions prior to the visit, additional usage of staff PPE, and extensive cleaning of exam room while observing appropriate contact time as indicated for disinfecting solutions.    Signed Lamar Blinks, MD  Received her labs as below, message to patient  Results for orders placed or performed in visit on 05/03/19  Urine Culture   Specimen: Blood  Result Value Ref Range   MICRO NUMBER: [id number]    SPECIMEN QUALITY: Adequate    Sample Source NOT GIVEN    STATUS: FINAL    Result: No Growth   Basic metabolic panel  Result Value Ref Range   Sodium 135 135 - 145 mEq/L   Potassium 3.6 3.5 - 5.1 mEq/L   Chloride 99 96 - 112 mEq/L   CO2 28 19 - 32 mEq/L   Glucose, Bld 101 (H) 70 - 99 mg/dL   BUN 13 6 - 23 mg/dL   Creatinine, Ser 0.75 0.40 - 1.20 mg/dL   GFR 75.50 >60.00 mL/min   Calcium 9.4 8.4 - 10.5 mg/dL  Hemoglobin A1c  Result Value Ref Range   Hgb A1c MFr Bld 6.2 4.6 - 6.5 %  POCT urinalysis dipstick  Result Value Ref Range    Color, UA orange (A) yellow   Clarity, UA cloudy (A) clear   Glucose, UA =500 (A) negative mg/dL   Bilirubin, UA large (A) negative   Ketones, POC UA moderate (40) (A) negative mg/dL   Spec Grav, UA >=1.030 (A) 1.010 - 1.025   Blood, UA large (A) negative   pH, UA 5.0 5.0 - 8.0   Protein Ur, POC >=300 (A) negative mg/dL   Urobilinogen, UA >=8.0 (A) 0.2 or 1.0 E.U./dL   Nitrite, UA Positive (A) Negative   Leukocytes, UA Large (3+) (A) Negative    Received her urine culture 4/28-  Patient has  gross hematuria without infection. I will need to refer her to urology  I called her daughter [name], left a detailed message on her phone with this information We will also contact patient via MyChart, will order urology consultation

## 2019-05-03 NOTE — Telephone Encounter (Signed)
Patient daughter called in to see if DR. Paz or the nurse could give her a call back to discuss the patient test results.

## 2019-05-04 ENCOUNTER — Ambulatory Visit: Payer: Medicare Other | Admitting: Physical Therapy

## 2019-05-04 ENCOUNTER — Encounter: Payer: Self-pay | Admitting: Physical Therapy

## 2019-05-04 ENCOUNTER — Encounter: Payer: Medicare Other | Admitting: Physical Therapy

## 2019-05-04 DIAGNOSIS — R296 Repeated falls: Secondary | ICD-10-CM | POA: Diagnosis not present

## 2019-05-04 DIAGNOSIS — R279 Unspecified lack of coordination: Secondary | ICD-10-CM

## 2019-05-04 DIAGNOSIS — M6281 Muscle weakness (generalized): Secondary | ICD-10-CM

## 2019-05-04 NOTE — Therapy (Signed)
[facility] [address], [place], [place], [address] Phone: [phone]   Fax:  [phone]  Physical Therapy Treatment  Patient Details  Name: [name] MRN: [id number] Date of Birth: 1945-12-10 Referring Provider (PT): Kathie Rhodes, MD   Encounter Date: 05/04/2019  PT End of Session - 05/04/19 1046    Visit Number  9    Date for PT Re-Evaluation  06/22/19    Authorization Type  medicare    PT Start Time  1018    PT Stop Time  1058    PT Time Calculation (min)  40 min    Activity Tolerance  Patient tolerated treatment well    Behavior During Therapy  [facility] for tasks assessed/performed       Past Medical History:  Diagnosis Date  . Adenomatous colon polyp 03/1988  . Anemia    borderline  . Arthritis    R shoulder - degenerative   . Cataract    beginning stages  . Chest pain     Sept 2011: stress test neg  . Depression    sees Dr.Cotle  . Diabetes mellitus    dr Buddy Duty  . Diverticulosis   . Fatty liver    Increased LFTs, saw GI 06/2011, likely from fatty liver   . GERD (gastroesophageal reflux disease)   . History of hiatal hernia   . Hyperlipemia   . Hypertension   . IBS (irritable bowel syndrome)    and dyspepsia  . Memory impairment 08/2014   mild cognitive impairment vs. mild dementia, reommended reinstating of cholinesterase inhibitor   . Osteopenia    dexa 06/2007 and 10/11 Rx CA vitamin d  . RLS (restless legs syndrome)    h/o- no longer using Requip    Past Surgical History:  Procedure Laterality Date  . BREAST BIOPSY Left 10/11/2015   fibroadenoma w/ calcifications, no evidence of malignancy, recommend mammogram repeat-6 mnths  . POLYPECTOMY    . REVERSE SHOULDER ARTHROPLASTY Right 04/21/2014   Procedure: REVERSE SHOULDER ARTHROPLASTY;  Surgeon: Tania Ade, MD;  Location: [facility];  Service: Orthopedics;  Laterality: Right;  Right reverse total shoulder replacement  . TONSILLECTOMY     . UTERINE FIBROID EMBOLIZATION     90s    There were no vitals filed for this visit.  Subjective Assessment - 05/04/19 1043    Subjective  My Lt knee is hurting when I stand and walk.  Denies pain currently, but observed difficulty standing from the waiting area.    Currently in Pain?  No/denies                       [facility] Adult PT Treatment/Exercise - 05/04/19 0001      Lumbar Exercises: Aerobic   Nustep  L1 x 4 min - Able to tolerate better today; PT present to monitor pain      Lumbar Exercises: Seated   Sit to Stand  5 reps   assessing from elevated surface before and after manaull   Sit to Stand Limitations  some improvement after STM      Knee/Hip Exercises: Seated   Long Arc Quad  Strengthening;Right;Left;2 sets;15 reps    Long Arc Quad Weight  3 lbs.    Long [employer] Limitations  ball squeeze with kegel      Knee/Hip Exercises: Supine   Short Arc [employer]  Strengthening;Right;Left;2 sets;10 reps    Technical brewer Limitations  3  lb    Straight Leg Raise with External Rotation  Strengthening;Right;Left;2 sets;10 reps    Straight Leg Raise with External Rotation Limitations  cues to brace       Manual Therapy   Manual Therapy  Soft tissue mobilization    Soft tissue mobilization  TFL, IT band, lateral quad, lateral quad tendon;                PT Short Term Goals - 04/27/19 1220      PT SHORT TERM GOAL #3   Title  Pt will be able to perform 5 x sit to stand with no UE assistance    Status  Achieved        PT Long Term Goals - 04/27/19 1221      PT LONG TERM GOAL #1   Title  Pt will report at least 50% less urinary and fecal leakage     Baseline  definitely better, about 50% but maybe not quite    Status  On-going      PT LONG TERM GOAL #2   Title  pt will be ind with advanced HEP    Status  On-going      PT LONG TERM GOAL #3   Title  Pt will report needing only 1 pad/depends per day    Baseline  2/day and still putting  a chuck pad    Status  On-going      PT LONG TERM GOAL #4   Title  Pt will demonstrate 5x sit to stand in <13 seconds for reduced risk of falls    Baseline  30 sec            Plan - 05/04/19 1052    Clinical Impression Statement  Today's session focused on core and pelvic floor with LE and quad strengthening.  Pt needs to incorporate quad strength due to knee pain that is altering her movements and reducing her pelvic floor activation especially noted during sit to stand today.  pt will continue to benefit from skilled PT to work on core and LE strength.    PT Treatment/Interventions  ADLs/Self Care Home Management;Biofeedback;Cryotherapy;Electrical Stimulation;Moist Heat;Neuromuscular re-education;Therapeutic exercise;Therapeutic activities;Taping;Dry needling;Passive range of motion;Patient/family education;Manual techniques    PT Next Visit Plan  add quad and TFL/IT band stretches, VMO strengthening with core and pelvic floor bracing; small squats and sit to stand from elevated surface    PT Home Exercise Plan  Access Code: [email or web address]    Consulted and Agree with Plan of Care  Patient       Patient will benefit from skilled therapeutic intervention in order to improve the following deficits and impairments:  Abnormal gait, Decreased coordination, Pain, Postural dysfunction, Decreased strength  Visit Diagnosis: Repeated falls  Muscle weakness (generalized)  Unspecified lack of coordination     Problem List Patient Active Problem List   Diagnosis Date Noted  . Dark stools 03/17/2017  . SIADH (syndrome of inappropriate ADH production) ([place]) 10/10/2016  . Gastroesophageal reflux disease 08/06/2016  . PCP Notes >>>>>>>>>>>>>>>>> 09/21/2014  . Rotator cuff tear arthropathy 04/21/2014  . Abnormal MRI, shoulder 12/01/2013  . 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 Cervical radiculitis 11/10/2013  . Bursitis of right shoulder 08/19/2013  . Primary localized  osteoarthrosis, lower leg 08/19/2013  . Vitamin B 12 deficiency 05/20/2013  . OSA (obstructive sleep apnea) 02/13/2012  . Pulmonary nodule, last  CT 2016, stable, no f/u 01/28/2012  . Dementia ([place]) 05/29/2011  . Fatty liver 02/27/2011  .  Annual physical exam 11/26/2010  . Osteopenia 10/09/2007  . COLONIC POLYPS 08/22/2006  . DM II (diabetes mellitus, type II), controlled ([place]) 05/19/2006  . Hyperlipidemia 05/19/2006  . Depression 05/19/2006  . SYNDROME, RESTLESS LEGS 05/19/2006  . Essential hypertension 05/19/2006  . IBS -- constipation 05/19/2006    Jule Ser, PT 05/04/2019, 11:03 AM  [facility] [address] [place], [place], [address] Phone: [phone]   Fax:  [phone]  Name: [name] MRN: [id number] Date of Birth: 1945-11-12

## 2019-05-05 ENCOUNTER — Encounter: Payer: Self-pay | Admitting: Family Medicine

## 2019-05-05 LAB — URINE CULTURE
MICRO NUMBER:: 10405259
Result:: NO GROWTH
SPECIMEN QUALITY:: ADEQUATE

## 2019-05-05 NOTE — Addendum Note (Signed)
Addended by: Darreld Mclean on: 05/05/2019 06:32 PM   Modules accepted: Orders

## 2019-05-06 ENCOUNTER — Other Ambulatory Visit: Payer: Self-pay

## 2019-05-06 ENCOUNTER — Encounter: Payer: Self-pay | Admitting: Physical Therapy

## 2019-05-06 ENCOUNTER — Ambulatory Visit: Payer: Medicare Other | Admitting: Physical Therapy

## 2019-05-06 DIAGNOSIS — R296 Repeated falls: Secondary | ICD-10-CM

## 2019-05-06 DIAGNOSIS — M6281 Muscle weakness (generalized): Secondary | ICD-10-CM

## 2019-05-06 DIAGNOSIS — R279 Unspecified lack of coordination: Secondary | ICD-10-CM

## 2019-05-06 NOTE — Addendum Note (Signed)
Addended by: Darreld Mclean on: 05/06/2019 06:54 PM   Modules accepted: Orders

## 2019-05-06 NOTE — Telephone Encounter (Signed)
Called and spoke with daughter- she relates that pt has already been seen by [facility] or hematuria w/u recently. I will request these records- assuming taken care of no need for further eval.  Will cancel new referral  She saw Dr Karsten Ro we think.  I don't have notes but will take care of this

## 2019-05-06 NOTE — Telephone Encounter (Signed)
Caller: [name]   Call Back # [phone]  Requesting a call back from provider in regards to lab results.

## 2019-05-06 NOTE — Therapy (Signed)
[facility] [address], [place], [place], [address] Phone: [phone]   Fax:  [phone]  Physical Therapy Treatment  Patient Details  Name: [name] MRN: [id number] Date of Birth: 1945-05-12 Referring Provider (PT): Kathie Rhodes, MD   Encounter Date: 05/06/2019  PT End of Session - 05/06/19 1406    Visit Number  10    Number of Visits  20    Date for PT Re-Evaluation  06/22/19    Authorization Type  medicare    PT Start Time  1400    PT Stop Time  1438    PT Time Calculation (min)  38 min    Activity Tolerance  Patient tolerated treatment well    Behavior During Therapy  [facility] for tasks assessed/performed       Past Medical History:  Diagnosis Date  . Adenomatous colon polyp 03/1988  . Anemia    borderline  . Arthritis    R shoulder - degenerative   . Cataract    beginning stages  . Chest pain     Sept 2011: stress test neg  . Depression    sees Dr.Cotle  . Diabetes mellitus    dr Buddy Duty  . Diverticulosis   . Fatty liver    Increased LFTs, saw GI 06/2011, likely from fatty liver   . GERD (gastroesophageal reflux disease)   . History of hiatal hernia   . Hyperlipemia   . Hypertension   . IBS (irritable bowel syndrome)    and dyspepsia  . Memory impairment 08/2014   mild cognitive impairment vs. mild dementia, reommended reinstating of cholinesterase inhibitor   . Osteopenia    dexa 06/2007 and 10/11 Rx CA vitamin d  . RLS (restless legs syndrome)    h/o- no longer using Requip    Past Surgical History:  Procedure Laterality Date  . BREAST BIOPSY Left 10/11/2015   fibroadenoma w/ calcifications, no evidence of malignancy, recommend mammogram repeat-6 mnths  . POLYPECTOMY    . REVERSE SHOULDER ARTHROPLASTY Right 04/21/2014   Procedure: REVERSE SHOULDER ARTHROPLASTY;  Surgeon: Tania Ade, MD;  Location: [employer];  Service: Orthopedics;  Laterality: Right;  Right reverse total shoulder  replacement  . TONSILLECTOMY    . UTERINE FIBROID EMBOLIZATION     90s    There were no vitals filed for this visit.  Subjective Assessment - 05/06/19 1405    Subjective  My knees are okay today.  The leakage is much better.  I am using 1 or 2 depends at the most.    Patient Stated Goals  wear regular underwear again    Currently in Pain?  No/denies                       [facility] Adult PT Treatment/Exercise - 05/06/19 0001      Lumbar Exercises: Machines for Strengthening   Leg Press  bilat 70lb 20x   VC to do eccentric pelvic floor strengthening     Lumbar Exercises: Standing   Other Standing Lumbar Exercises  wall push on red ball - 10x kegel    Other Standing Lumbar Exercises  tandem stand with kegel - needed to focus more on balance in this position      Lumbar Exercises: Seated   Other Seated Lumbar Exercises  seated ball squeeze and hip abduction green band with kegel 8 sec hold - 10x      Knee/Hip Exercises: Seated   Sit  to Sand  10 reps;with UE support   with kegel               PT Short Term Goals - 04/27/19 1220      PT SHORT TERM GOAL #3   Title  Pt will be able to perform 5 x sit to stand with no UE assistance    Status  Achieved        PT Long Term Goals - 04/27/19 1221      PT LONG TERM GOAL #1   Title  Pt will report at least 50% less urinary and fecal leakage     Baseline  definitely better, about 50% but maybe not quite    Status  On-going      PT LONG TERM GOAL #2   Title  pt will be ind with advanced HEP    Status  On-going      PT LONG TERM GOAL #3   Title  Pt will report needing only 1 pad/depends per day    Baseline  2/day and still putting a chuck pad    Status  On-going      PT LONG TERM GOAL #4   Title  Pt will demonstrate 5x sit to stand in <13 seconds for reduced risk of falls    Baseline  30 sec            Plan - 05/06/19 1442    Clinical Impression Statement  Pt was able to tolerate exercises more  easily today due to less knee pain.  Overall pt is making progress.  It was noted that her balance was not good when doing tandem stance exercises.  unable to step one foot forward without UE support and had several LOB that she was able to correct with UE support.  Pt will benefit from skilled PT to progress strength and follow up on balance in various positions.    Personal Factors and Comorbidities  Age;Time since onset of injury/illness/exacerbation;Comorbidity 2    Comorbidities  impaired memory; knee OA    PT Treatment/Interventions  ADLs/Self Care Home Management;Biofeedback;Cryotherapy;Electrical Stimulation;Moist Heat;Neuromuscular re-education;Therapeutic exercise;Therapeutic activities;Taping;Dry needling;Passive range of motion;Patient/family education;Manual techniques    PT Next Visit Plan  tandem standing; add quad and TFL/IT band stretches, VMO strengthening with core and pelvic floor bracing; small squats and sit to stand from elevated surface    PT Home Exercise Plan  Access Code: [email or web address]    Consulted and Agree with Plan of Care  Patient       Patient will benefit from skilled therapeutic intervention in order to improve the following deficits and impairments:  Abnormal gait, Decreased coordination, Pain, Postural dysfunction, Decreased strength  Visit Diagnosis: Repeated falls  Muscle weakness (generalized)  Unspecified lack of coordination     Problem List Patient Active Problem List   Diagnosis Date Noted  . Dark stools 03/17/2017  . SIADH (syndrome of inappropriate ADH production) ([place]) 10/10/2016  . Gastroesophageal reflux disease 08/06/2016  . PCP Notes >>>>>>>>>>>>>>>>> 09/21/2014  . Rotator cuff tear arthropathy 04/21/2014  . Abnormal MRI, shoulder 12/01/2013  . 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 Cervical radiculitis 11/10/2013  . Bursitis of right shoulder 08/19/2013  . Primary localized osteoarthrosis, lower leg 08/19/2013  . Vitamin B 12  deficiency 05/20/2013  . OSA (obstructive sleep apnea) 02/13/2012  . Pulmonary nodule, last  CT 2016, stable, no f/u 01/28/2012  . Dementia ([place]) 05/29/2011  . Fatty liver 02/27/2011  . Annual physical exam 11/26/2010  .  Osteopenia 10/09/2007  . COLONIC POLYPS 08/22/2006  . DM II (diabetes mellitus, type II), controlled ([place]) 05/19/2006  . Hyperlipidemia 05/19/2006  . Depression 05/19/2006  . SYNDROME, RESTLESS LEGS 05/19/2006  . Essential hypertension 05/19/2006  . IBS -- constipation 05/19/2006    Jule Ser, PT 05/06/2019, 3:11 PM  [facility] [address], [place], [place], [address] Phone: [phone]   Fax:  [phone]  Name: NUSAIBAH HEIDEL MRN: [id number] Date of Birth: [birth year]

## 2019-05-10 NOTE — Telephone Encounter (Signed)
Records release sent to [facility] urology

## 2019-05-11 ENCOUNTER — Ambulatory Visit: Payer: Medicare Other | Attending: Urology | Admitting: Physical Therapy

## 2019-05-11 ENCOUNTER — Other Ambulatory Visit: Payer: Self-pay

## 2019-05-11 ENCOUNTER — Ambulatory Visit (INDEPENDENT_AMBULATORY_CARE_PROVIDER_SITE_OTHER): Payer: Medicare Other | Admitting: Internal Medicine

## 2019-05-11 ENCOUNTER — Encounter: Payer: Self-pay | Admitting: Internal Medicine

## 2019-05-11 ENCOUNTER — Encounter: Payer: Self-pay | Admitting: Physical Therapy

## 2019-05-11 VITALS — BP 132/90 | HR 58 | Temp 97.2°F | Resp 18 | Ht 66.0 in | Wt 175.5 lb

## 2019-05-11 DIAGNOSIS — R296 Repeated falls: Secondary | ICD-10-CM | POA: Insufficient documentation

## 2019-05-11 DIAGNOSIS — W57XXXA Bitten or stung by nonvenomous insect and other nonvenomous arthropods, initial encounter: Secondary | ICD-10-CM | POA: Diagnosis not present

## 2019-05-11 DIAGNOSIS — E785 Hyperlipidemia, unspecified: Secondary | ICD-10-CM | POA: Diagnosis not present

## 2019-05-11 DIAGNOSIS — S30860A Insect bite (nonvenomous) of lower back and pelvis, initial encounter: Secondary | ICD-10-CM

## 2019-05-11 DIAGNOSIS — M6281 Muscle weakness (generalized): Secondary | ICD-10-CM | POA: Insufficient documentation

## 2019-05-11 DIAGNOSIS — R279 Unspecified lack of coordination: Secondary | ICD-10-CM | POA: Diagnosis present

## 2019-05-11 NOTE — Progress Notes (Signed)
Subjective:    Patient ID: [name], female    DOB: [birth year], 74 y.o.   MRN: [id number]  DOS:  05/11/2019 Type of visit - description: Acute Found the tick yesterday located on her back. She frequently take care of her plants on her balcony and she thinks that is where she got it.  Med compliance: Not taking atorvastatin.  Review of Systems Denies fever chills No headaches No rash No unusual aches or pains  Past Medical History:  Diagnosis Date  . Adenomatous colon polyp 03/1988  . Anemia    borderline  . Arthritis    R shoulder - degenerative   . Cataract    beginning stages  . Chest pain     Sept 2011: stress test neg  . Depression    sees Dr.Cotle  . Diabetes mellitus    dr Buddy Duty  . Diverticulosis   . Fatty liver    Increased LFTs, saw GI 06/2011, likely from fatty liver   . GERD (gastroesophageal reflux disease)   . History of hiatal hernia   . Hyperlipemia   . Hypertension   . IBS (irritable bowel syndrome)    and dyspepsia  . Memory impairment 08/2014   mild cognitive impairment vs. mild dementia, reommended reinstating of cholinesterase inhibitor   . Osteopenia    dexa 06/2007 and 10/11 Rx CA vitamin d  . RLS (restless legs syndrome)    h/o- no longer using Requip    Past Surgical History:  Procedure Laterality Date  . BREAST BIOPSY Left 10/11/2015   fibroadenoma w/ calcifications, no evidence of malignancy, recommend mammogram repeat-6 mnths  . POLYPECTOMY    . REVERSE SHOULDER ARTHROPLASTY Right 04/21/2014   Procedure: REVERSE SHOULDER ARTHROPLASTY;  Surgeon: Tania Ade, MD;  Location: [facility];  Service: Orthopedics;  Laterality: Right;  Right reverse total shoulder replacement  . TONSILLECTOMY    . UTERINE FIBROID EMBOLIZATION     90s    Allergies as of 05/11/2019      Reactions   Sulfa Antibiotics Nausea Only   Trulicity [dulaglutide] Nausea Only   Penicillins Rash, Other (See Comments)   Has patient had a PCN reaction causing  immediate rash, facial/tongue/throat swelling, SOB or lightheadedness with hypotension: No Has patient had a PCN reaction causing severe rash involving mucus membranes or skin necrosis: No Has patient had a PCN reaction that required hospitalization No Has patient had a PCN reaction occurring within the last 10 years: No If all of the above answers are "NO", then may proceed with Cephalosporin use.      Medication List       Accurate as of May 11, 2019 11:59 PM. If you have any questions, ask your nurse or doctor.        STOP taking these medications   ciprofloxacin 500 MG tablet Commonly known as: Cipro Stopped by: Kathlene November, MD     TAKE these medications   Accu-Chek FastClix Lancets Misc daily. use as directed   acetaminophen 325 MG tablet Commonly known as: TYLENOL Take 650 mg by mouth every 6 (six) hours as needed for mild pain or headache.   Aricept 10 MG tablet Generic drug: donepezil Take 2 tablets (20 mg total) by mouth at bedtime.   aspirin 81 MG EC tablet Commonly known as: QC LO-DOSE ASPIRIN Take 1 tablet (81 mg total) by mouth daily.   atenolol 50 MG tablet Commonly known as: TENORMIN Take 25 mg by mouth daily.   atorvastatin 40  MG tablet Commonly known as: LIPITOR Take 1 tablet (40 mg total) by mouth daily.   azelastine 0.1 % nasal spray Commonly known as: ASTELIN Place 2 sprays into both nostrils at bedtime as needed for rhinitis. Use in each nostril as directed   B-12 1000 MCG Tbcr Take 1 tablet by mouth daily.   BD Veo Insulin Syringe U/F 31G X 15/64" 1 ML Misc Generic drug: Insulin Syringe-Needle U-100 USE TO INJECT insulin EVERY DAY   Centrum Silver 50+Women Tabs Take 1 tablet by mouth daily.   darifenacin 15 MG 24 hr tablet Commonly known as: ENABLEX Take 1 tablet (15 mg total) by mouth daily.   Ferro-Sequels 65-25 MG Tbcr Generic drug: Ferrous Fumarate-Vitamin C ER Take 1 tablet by mouth daily.   gabapentin 100 MG capsule Commonly  known as: NEURONTIN Take 1 capsule (100 mg total) by mouth at bedtime.   Levemir FlexTouch 100 UNIT/ML FlexPen Generic drug: insulin detemir Inject 42 Units into the skin at bedtime.   losartan 25 MG tablet Commonly known as: COZAAR Take 1 tablet (25 mg total) by mouth every evening.   memantine 10 MG tablet Commonly known as: NAMENDA Take 10 mg by mouth 2 (two) times daily.   metFORMIN 500 MG 24 hr tablet Commonly known as: GLUCOPHAGE-XR TAKE 1 TABLET BY MOUTH EVERY MORNING WITH A MEAL   ONE TOUCH ULTRA TEST test strip Generic drug: glucose blood USE TO CHECK BLOOD SUGAR EVERY DAY   pantoprazole 40 MG tablet Commonly known as: PROTONIX Take 1 tablet (40 mg total) by mouth 2 (two) times daily before a meal.   PreviDent 5000 Dry Mouth 1.1 % Gel dental gel Generic drug: sodium fluoride Place 1 application onto teeth at bedtime as needed (for teeth).   sertraline 100 MG tablet Commonly known as: ZOLOFT TAKE 1 TABLET BY MOUTH EVERY DAY AND TAKE 1/2 TABLET AT BEDTIME   Ultram 50 MG tablet Generic drug: traMADol Take 1 tablet (50 mg total) by mouth every 12 (twelve) hours as needed.          Objective:   Physical Exam BP 132/90 (BP Location: Left Arm, Patient Position: Sitting, Cuff Size: Normal)   Pulse (!) 58   Temp (!) 97.2 F (36.2 C) (Temporal)   Resp 18   Ht 5\' 6"  (1.676 m)   Wt 175 lb 8 oz (79.6 kg)   SpO2 99%   BMI 28.33 kg/m  General:   Well developed, NAD, BMI noted. HEENT:  Normocephalic . Face symmetric, atraumatic Skin: Has a non engorge brown tick at the left lower back.  No rash. Neurologic:  alert & oriented X3.  Speech normal, gait appropriate for age and unassisted Psych--  Cognition and judgment appear intact.  Cooperative with normal attention span and concentration.  Behavior appropriate. No anxious or depressed appearing.      Assessment       Assessment  DM Dr. Buddy Duty HTN Hyperlipidemia H/o SIADH Depression seen  elsewhere Mild cognitive impairment  MMSE 2015 --> 28, on Aricept, namenda added 04-2016 by Dr Everette Rank  OSA -- mild, also told RLS (Rx requip); saw Dr Gwenette Greet 2014, no CPAP GI: --IBS, colon polyps, diverticulosis, hiatal hernia --Chronic constipation likely part of her IBS syndrome  --Fatty liver >>> GI eval 2013 --iron fec anemia:  cscope 04-2014 : 2 polyps; + hemocult @ GI office 10-2014: EGD done (-), bx neg Osteopenia: T score -1.1  2009 , osteopenia again per dexa 05-2013, t score -1.8 (10-09-17),  scanned, rx ca-vit d B12 deficiency  RLS MSK: --DJD frozen shoulder Dizziness: chronic, carotid US 2016 neg, saw cards-- not likely CV related; saw neuro DR Everette Rank 02-2015  Chest pain 09-2009, negative stress test Thyroid nodules: Incidental   by Carotid US, BX 11-2014: Atypical findings, [facility], f/u  Endocrine RLL Lung nodule: Stable per CT 09-2016, no need for further B/B incontinence:  uses diapers   PLAN: Tick bite: The tick was completely removed using appropriate technique with forceps, no rash.  Recommend to watch for unusual symptoms in the next few days and let me know. High cholesterol: Patient reports he is not taking atorvastatin because she was told by one of her doctors.  I reviewed the chart, I could not find anything related to stopping atorvastatin, last LFTs normal, recommend to go back on it. RTC already scheduled for 07-2019  This visit occurred during the SARS-CoV-2 public health emergency.  Safety protocols were in place, including screening questions prior to the visit, additional usage of staff PPE, and extensive cleaning of exam room while observing appropriate contact time as indicated for disinfecting solutions.

## 2019-05-11 NOTE — Therapy (Signed)
[facility] [address] [place], [place], [address] Phone: [phone]   Fax:  [phone]  Physical Therapy Treatment  Patient Details  Name: [name] MRN: [id number] Date of Birth: December 04, 1945 Referring Provider (PT): Kathie Rhodes, MD   Encounter Date: 05/11/2019  PT End of Session - 05/11/19 1020    Visit Number  11    Number of Visits  20    Date for PT Re-Evaluation  06/22/19    Authorization Type  medicare    PT Start Time  1015    PT Stop Time  1055    PT Time Calculation (min)  40 min    Activity Tolerance  Patient tolerated treatment well    Behavior During Therapy  [facility] for tasks assessed/performed       Past Medical History:  Diagnosis Date  . Adenomatous colon polyp 03/1988  . Anemia    borderline  . Arthritis    R shoulder - degenerative   . Cataract    beginning stages  . Chest pain     Sept 2011: stress test neg  . Depression    sees Dr.Cotle  . Diabetes mellitus    dr Buddy Duty  . Diverticulosis   . Fatty liver    Increased LFTs, saw GI 06/2011, likely from fatty liver   . GERD (gastroesophageal reflux disease)   . History of hiatal hernia   . Hyperlipemia   . Hypertension   . IBS (irritable bowel syndrome)    and dyspepsia  . Memory impairment 08/2014   mild cognitive impairment vs. mild dementia, reommended reinstating of cholinesterase inhibitor   . Osteopenia    dexa 06/2007 and 10/11 Rx CA vitamin d  . RLS (restless legs syndrome)    h/o- no longer using Requip    Past Surgical History:  Procedure Laterality Date  . BREAST BIOPSY Left 10/11/2015   fibroadenoma w/ calcifications, no evidence of malignancy, recommend mammogram repeat-6 mnths  . POLYPECTOMY    . REVERSE SHOULDER ARTHROPLASTY Right 04/21/2014   Procedure: REVERSE SHOULDER ARTHROPLASTY;  Surgeon: Tania Ade, MD;  Location: [facility];  Service: Orthopedics;  Laterality: Right;  Right reverse total shoulder  replacement  . TONSILLECTOMY    . UTERINE FIBROID EMBOLIZATION     90s    There were no vitals filed for this visit.  Subjective Assessment - 05/11/19 1023    Subjective  My knees are much better.  Denies pain today.    Currently in Pain?  No/denies                       [facility] Adult PT Treatment/Exercise - 05/11/19 0001      Lumbar Exercises: Aerobic   Nustep  L1 x 6 min - PT present for status update      Lumbar Exercises: Seated   Long Arc Quad on Chair  Strengthening;Both;20 reps    LAQ on Chair Limitations  ball squeeze added    Other Seated Lumbar Exercises  seated ball squeeze and hip abduction green band with kegel 8 sec hold - 10x      Lumbar Exercises: Supine   Ab Set  20 reps    AB Set Limitations  with SAQ 2lb weight    Bent Knee Raise  15 reps   2 lb weight   Dead Bug Limitations  UE only 2lb weight    Straight Leg Raise  10 reps   2lb  PT Short Term Goals - 04/27/19 1220      PT SHORT TERM GOAL #3   Title  Pt will be able to perform 5 x sit to stand with no UE assistance    Status  Achieved        PT Long Term Goals - 04/27/19 1221      PT LONG TERM GOAL #1   Title  Pt will report at least 50% less urinary and fecal leakage     Baseline  definitely better, about 50% but maybe not quite    Status  On-going      PT LONG TERM GOAL #2   Title  pt will be ind with advanced HEP    Status  On-going      PT LONG TERM GOAL #3   Title  Pt will report needing only 1 pad/depends per day    Baseline  2/day and still putting a chuck pad    Status  On-going      PT LONG TERM GOAL #4   Title  Pt will demonstrate 5x sit to stand in <13 seconds for reduced risk of falls    Baseline  30 sec            Plan - 05/11/19 1057    Clinical Impression Statement  Pt reports overall less knee pain.  She continues to have some difficutly with sit to stand today due to knee pain.  Pt added exercises that assist in both knee  strength and pelvic control in order to address both issues.  pt did well with fatigue and able to add resistance.  She will benefit from skilled PT to continue to progress core and knee strength.    PT Treatment/Interventions  ADLs/Self Care Home Management;Biofeedback;Cryotherapy;Electrical Stimulation;Moist Heat;Neuromuscular re-education;Therapeutic exercise;Therapeutic activities;Taping;Dry needling;Passive range of motion;Patient/family education;Manual techniques    PT Next Visit Plan  tandem standing; add quad and TFL/IT band stretches, VMO strengthening with core and pelvic floor bracing; small squats and sit to stand from elevated surface    PT Home Exercise Plan  Access Code: [email or web address]    Consulted and Agree with Plan of Care  Patient       Patient will benefit from skilled therapeutic intervention in order to improve the following deficits and impairments:  Abnormal gait, Decreased coordination, Pain, Postural dysfunction, Decreased strength  Visit Diagnosis: Repeated falls  Muscle weakness (generalized)  Unspecified lack of coordination     Problem List Patient Active Problem List   Diagnosis Date Noted  . Dark stools 03/17/2017  . SIADH (syndrome of inappropriate ADH production) ([place]) 10/10/2016  . Gastroesophageal reflux disease 08/06/2016  . PCP Notes >>>>>>>>>>>>>>>>> 09/21/2014  . Rotator cuff tear arthropathy 04/21/2014  . Abnormal MRI, shoulder 12/01/2013  . 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 Cervical radiculitis 11/10/2013  . Bursitis of right shoulder 08/19/2013  . Primary localized osteoarthrosis, lower leg 08/19/2013  . Vitamin B 12 deficiency 05/20/2013  . OSA (obstructive sleep apnea) 02/13/2012  . Pulmonary nodule, last  CT 2016, stable, no f/u 01/28/2012  . Dementia ([place]) 05/29/2011  . Fatty liver 02/27/2011  . Annual physical exam 11/26/2010  . Osteopenia 10/09/2007  . COLONIC POLYPS 08/22/2006  . DM II (diabetes mellitus, type II),  controlled ([place]) 05/19/2006  . Hyperlipidemia 05/19/2006  . Depression 05/19/2006  . SYNDROME, RESTLESS LEGS 05/19/2006  . Essential hypertension 05/19/2006  . IBS -- constipation 05/19/2006    Jule Ser, PT 05/11/2019, 10:59 AM  [facility] [address] [place], [place], [address] Phone: [phone]   Fax:  [phone]  Name: [name] MRN: [id number] Date of Birth: 02/26/1945

## 2019-05-11 NOTE — Progress Notes (Signed)
Pre visit review using our clinic review tool, if applicable. No additional management support is needed unless otherwise documented below in the visit note. 

## 2019-05-12 NOTE — Telephone Encounter (Signed)
Received recent office visit notes from [facility].  Patient was seen in February of this year.  She has had microhematuria and has undergone evaluation including upper tract and cystoscopy At this point I do not think anything further is needed

## 2019-05-12 NOTE — Assessment & Plan Note (Signed)
Tick bite: The tick was completely removed using appropriate technique with forceps, no rash.  Recommend to watch for unusual symptoms in the next few days and let me know. High cholesterol: Patient reports he is not taking atorvastatin because she was told by one of her doctors.  I reviewed the chart, I could not find anything related to stopping atorvastatin, last LFTs normal, recommend to go back on it. RTC already scheduled for 07-2019

## 2019-05-13 ENCOUNTER — Ambulatory Visit: Payer: Medicare Other | Admitting: Physical Therapy

## 2019-05-13 ENCOUNTER — Encounter: Payer: Self-pay | Admitting: Physical Therapy

## 2019-05-13 ENCOUNTER — Other Ambulatory Visit: Payer: Self-pay

## 2019-05-13 DIAGNOSIS — R296 Repeated falls: Secondary | ICD-10-CM

## 2019-05-13 DIAGNOSIS — R279 Unspecified lack of coordination: Secondary | ICD-10-CM

## 2019-05-13 DIAGNOSIS — M6281 Muscle weakness (generalized): Secondary | ICD-10-CM

## 2019-05-13 NOTE — Therapy (Signed)
[facility] [address], [place], [place], [address] Phone: [phone]   Fax:  [phone]  Physical Therapy Treatment  Patient Details  Name: [name] MRN: [id number] Date of Birth: 11-01-1945 Referring Provider (PT): Kathie Rhodes, MD   Encounter Date: 05/13/2019  PT End of Session - 05/13/19 1408    Visit Number  12    Number of Visits  20    Date for PT Re-Evaluation  06/22/19    Authorization Type  medicare    PT Start Time  1402    PT Stop Time  1443    PT Time Calculation (min)  41 min    Activity Tolerance  Patient tolerated treatment well    Behavior During Therapy  [facility] for tasks assessed/performed       Past Medical History:  Diagnosis Date  . Adenomatous colon polyp 03/1988  . Anemia    borderline  . Arthritis    R shoulder - degenerative   . Cataract    beginning stages  . Chest pain     Sept 2011: stress test neg  . Depression    sees Dr.Cotle  . Diabetes mellitus    dr Buddy Duty  . Diverticulosis   . Fatty liver    Increased LFTs, saw GI 06/2011, likely from fatty liver   . GERD (gastroesophageal reflux disease)   . History of hiatal hernia   . Hyperlipemia   . Hypertension   . IBS (irritable bowel syndrome)    and dyspepsia  . Memory impairment 08/2014   mild cognitive impairment vs. mild dementia, reommended reinstating of cholinesterase inhibitor   . Osteopenia    dexa 06/2007 and 10/11 Rx CA vitamin d  . RLS (restless legs syndrome)    h/o- no longer using Requip    Past Surgical History:  Procedure Laterality Date  . BREAST BIOPSY Left 10/11/2015   fibroadenoma w/ calcifications, no evidence of malignancy, recommend mammogram repeat-6 mnths  . POLYPECTOMY    . REVERSE SHOULDER ARTHROPLASTY Right 04/21/2014   Procedure: REVERSE SHOULDER ARTHROPLASTY;  Surgeon: Tania Ade, MD;  Location: [place];  Service: Orthopedics;  Laterality: Right;  Right reverse total shoulder  replacement  . TONSILLECTOMY    . UTERINE FIBROID EMBOLIZATION     90s    There were no vitals filed for this visit.  Subjective Assessment - 05/13/19 1405    Subjective  Pt states she is doing well today.  No new complaints. Leakage is still much less, but still can't wear regular underwear.  I am at least 50% better    Patient Stated Goals  wear regular underwear again    Currently in Pain?  No/denies                       [facility] Adult PT Treatment/Exercise - 05/13/19 0001      Lumbar Exercises: Aerobic   UBE (Upper Arm Bike)  L1 2/2 PT present to cue to engage core and for status update      Lumbar Exercises: Machines for Strengthening   Leg Press  bilat 80lb 10x; reduced to 60x10 due to knee pain   VC to do eccentric pelvic floor strengthening     Lumbar Exercises: Seated   Long Arc Quad on Chair  Strengthening;Both;20 reps    LAQ on Chair Limitations  ball squeeze added    Sit to Stand  10 reps   from elevated surface  Lumbar Exercises: Supine   Clam  20 reps    Clam Limitations  green    Large Ball Abdominal Isometric Limitations  ball presses and overhead lift    Other Supine Lumbar Exercises  bent knee fallout               PT Short Term Goals - 04/27/19 1220      PT SHORT TERM GOAL #3   Title  Pt will be able to perform 5 x sit to stand with no UE assistance    Status  Achieved        PT Long Term Goals - 04/27/19 1221      PT LONG TERM GOAL #1   Title  Pt will report at least 50% less urinary and fecal leakage     Baseline  definitely better, about 50% but maybe not quite    Status  On-going      PT LONG TERM GOAL #2   Title  pt will be ind with advanced HEP    Status  On-going      PT LONG TERM GOAL #3   Title  Pt will report needing only 1 pad/depends per day    Baseline  2/day and still putting a chuck pad    Status  On-going      PT LONG TERM GOAL #4   Title  Pt will demonstrate 5x sit to stand in <13 seconds for  reduced risk of falls    Baseline  30 sec            Plan - 05/13/19 1500    Clinical Impression Statement  Pt still has some difficulty with more intense load bearing exercises due to knee pain.  She was able to do exercises with increased resistance such as leg press and is doing well with keeping her core engaged.  Pt is making progress and reports 50% reduction in her symptoms.    PT Treatment/Interventions  ADLs/Self Care Home Management;Biofeedback;Cryotherapy;Electrical Stimulation;Moist Heat;Neuromuscular re-education;Therapeutic exercise;Therapeutic activities;Taping;Dry needling;Passive range of motion;Patient/family education;Manual techniques    PT Next Visit Plan  tandem standing; add quad and TFL/IT band stretches, VMO strengthening with core and pelvic floor bracing; small squats and sit to stand from elevated surface    PT Home Exercise Plan  Access Code: [email or web address]    Consulted and Agree with Plan of Care  Patient       Patient will benefit from skilled therapeutic intervention in order to improve the following deficits and impairments:  Abnormal gait, Decreased coordination, Pain, Postural dysfunction, Decreased strength  Visit Diagnosis: Repeated falls  Muscle weakness (generalized)  Unspecified lack of coordination     Problem List Patient Active Problem List   Diagnosis Date Noted  . Dark stools 03/17/2017  . SIADH (syndrome of inappropriate ADH production) ([place]) 10/10/2016  . Gastroesophageal reflux disease 08/06/2016  . PCP Notes >>>>>>>>>>>>>>>>> 09/21/2014  . Rotator cuff tear arthropathy 04/21/2014  . Abnormal MRI, shoulder 12/01/2013  . 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 Cervical radiculitis 11/10/2013  . Bursitis of right shoulder 08/19/2013  . Primary localized osteoarthrosis, lower leg 08/19/2013  . Vitamin B 12 deficiency 05/20/2013  . OSA (obstructive sleep apnea) 02/13/2012  . Pulmonary nodule, last  CT 2016, stable, no f/u  01/28/2012  . Dementia ([place]) 05/29/2011  . Fatty liver 02/27/2011  . Annual physical exam 11/26/2010  . Osteopenia 10/09/2007  . COLONIC POLYPS 08/22/2006  . DM II (diabetes mellitus, type II), controlled ([place])  05/19/2006  . Hyperlipidemia 05/19/2006  . Depression 05/19/2006  . SYNDROME, RESTLESS LEGS 05/19/2006  . Essential hypertension 05/19/2006  . IBS -- constipation 05/19/2006    Jule Ser, PT 05/13/2019, 3:06 PM  [facility] [address], [place], [place], [address] Phone: [phone]   Fax:  [phone]  Name: [name] MRN: [id number] Date of Birth: 1945/02/19

## 2019-05-17 ENCOUNTER — Encounter: Payer: Self-pay | Admitting: Internal Medicine

## 2019-05-18 ENCOUNTER — Encounter: Payer: Medicare Other | Admitting: Physical Therapy

## 2019-05-20 ENCOUNTER — Other Ambulatory Visit: Payer: Self-pay

## 2019-05-20 ENCOUNTER — Ambulatory Visit: Payer: Medicare Other | Admitting: Physical Therapy

## 2019-05-20 DIAGNOSIS — M6281 Muscle weakness (generalized): Secondary | ICD-10-CM

## 2019-05-20 DIAGNOSIS — R279 Unspecified lack of coordination: Secondary | ICD-10-CM

## 2019-05-20 DIAGNOSIS — R296 Repeated falls: Secondary | ICD-10-CM | POA: Diagnosis not present

## 2019-05-20 NOTE — Therapy (Signed)
[facility] [address], [place], [place], [address] Phone: [phone]   Fax:  [phone]  Physical Therapy Treatment  Patient Details  Name: [name] MRN: [id number] Date of Birth: 1945-08-12 Referring Provider (PT): Kathie Rhodes, MD   Encounter Date: 05/20/2019  PT End of Session - 05/20/19 1446    Visit Number  13    Number of Visits  20    Date for PT Re-Evaluation  06/22/19    Authorization Type  medicare    PT Start Time  1359    PT Stop Time  1445    PT Time Calculation (min)  46 min    Activity Tolerance  Patient tolerated treatment well    Behavior During Therapy  [facility] for tasks assessed/performed       Past Medical History:  Diagnosis Date  . Adenomatous colon polyp 03/1988  . Anemia    borderline  . Arthritis    R shoulder - degenerative   . Cataract    beginning stages  . Chest pain     Sept 2011: stress test neg  . Depression    sees Dr.Cotle  . Diabetes mellitus    dr Buddy Duty  . Diverticulosis   . Fatty liver    Increased LFTs, saw GI 06/2011, likely from fatty liver   . GERD (gastroesophageal reflux disease)   . History of hiatal hernia   . Hyperlipemia   . Hypertension   . IBS (irritable bowel syndrome)    and dyspepsia  . Memory impairment 08/2014   mild cognitive impairment vs. mild dementia, reommended reinstating of cholinesterase inhibitor   . Osteopenia    dexa 06/2007 and 10/11 Rx [place] vitamin d  . RLS (restless legs syndrome)    h/o- no longer using Requip    Past Surgical History:  Procedure Laterality Date  . BREAST BIOPSY Left 10/11/2015   fibroadenoma w/ calcifications, no evidence of malignancy, recommend mammogram repeat-6 mnths  . POLYPECTOMY    . REVERSE SHOULDER ARTHROPLASTY Right 04/21/2014   Procedure: REVERSE SHOULDER ARTHROPLASTY;  Surgeon: Tania Ade, MD;  Location: [place];  Service: Orthopedics;  Laterality: Right;  Right reverse total shoulder  replacement  . TONSILLECTOMY    . UTERINE FIBROID EMBOLIZATION     90s    There were no vitals filed for this visit.  Subjective Assessment - 05/20/19 1403    Subjective  Pt states she is doing better and able to wear regular underwear 1x at home.    Patient Stated Goals  wear regular underwear again    Currently in Pain?  No/denies                        [facility] Adult PT Treatment/Exercise - 05/20/19 0001      Lumbar Exercises: Aerobic   UBE (Upper Arm Bike)  --    Nustep  L1 x 5 min PT present to monitor and cue to engage core      Lumbar Exercises: Standing   Other Standing Lumbar Exercises  toe tapping on 6" - 20x    Other Standing Lumbar Exercises  tandem stand x 30 sec each side; feet together head turns 10x      Lumbar Exercises: Supine   Ab Set  20 reps    AB Set Limitations  with SAQ 2lb weight    Clam  20 reps    Clam Limitations  green  Bent Knee Raise  15 reps   2 lb weight   Bridge with [employer]  20 reps    Bridge with [employer] Limitations  kegel with ball squeeze    Straight Leg Raise  15 reps   2lb   Other Supine Lumbar Exercises  bent knee fallout - 10x each side               PT Short Term Goals - 04/27/19 1220      PT SHORT TERM GOAL #3   Title  Pt will be able to perform 5 x sit to stand with no UE assistance    Status  Achieved        PT Long Term Goals - 04/27/19 1221      PT LONG TERM GOAL #1   Title  Pt will report at least 50% less urinary and fecal leakage     Baseline  definitely better, about 50% but maybe not quite    Status  On-going      PT LONG TERM GOAL #2   Title  pt will be ind with advanced HEP    Status  On-going      PT LONG TERM GOAL #3   Title  Pt will report needing only 1 pad/depends per day    Baseline  2/day and still putting a chuck pad    Status  On-going      PT LONG TERM GOAL #4   Title  Pt will demonstrate 5x sit to stand in <13 seconds for reduced risk of falls     Baseline  30 sec            Plan - 05/20/19 1442    Clinical Impression Statement  Pt states she tried to wear regular underwear 1x and is getting close to wearing them more.  Overall, she continues to improve strength and was able to do the nustep without stopping due to pain.  Pt has some LOB during the toe tapping and is challenged with the balance exercises.  She will benefit from continued strengthening core and pelvic floor with more focus on balance for safety at home.    PT Treatment/Interventions  ADLs/Self Care Home Management;Biofeedback;Cryotherapy;Electrical Stimulation;Moist Heat;Neuromuscular re-education;Therapeutic exercise;Therapeutic activities;Taping;Dry needling;Passive range of motion;Patient/family education;Manual techniques    PT Next Visit Plan  tandem standing; add quad and TFL/IT band stretches, VMO strengthening with core and pelvic floor bracing; small squats and sit to stand from elevated surface    PT Home Exercise Plan  Access Code: [email or web address]       Patient will benefit from skilled therapeutic intervention in order to improve the following deficits and impairments:  Abnormal gait, Decreased coordination, Pain, Postural dysfunction, Decreased strength  Visit Diagnosis: Repeated falls  Muscle weakness (generalized)  Unspecified lack of coordination     Problem List Patient Active Problem List   Diagnosis Date Noted  . Dark stools 03/17/2017  . SIADH (syndrome of inappropriate ADH production) ([place]) 10/10/2016  . Gastroesophageal reflux disease 08/06/2016  . PCP Notes >>>>>>>>>>>>>>>>> 09/21/2014  . Rotator cuff tear arthropathy 04/21/2014  . Abnormal MRI, shoulder 12/01/2013  . 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 Cervical radiculitis 11/10/2013  . Bursitis of right shoulder 08/19/2013  . Primary localized osteoarthrosis, lower leg 08/19/2013  . Vitamin B 12 deficiency 05/20/2013  . OSA (obstructive sleep apnea) 02/13/2012  .  Pulmonary nodule, last  CT 2016, stable, no f/u 01/28/2012  . Dementia ([place]) 05/29/2011  .  Fatty liver 02/27/2011  . Annual physical exam 11/26/2010  . Osteopenia 10/09/2007  . COLONIC POLYPS 08/22/2006  . DM II (diabetes mellitus, type II), controlled ([place]) 05/19/2006  . Hyperlipidemia 05/19/2006  . Depression 05/19/2006  . SYNDROME, RESTLESS LEGS 05/19/2006  . Essential hypertension 05/19/2006  . IBS -- constipation 05/19/2006    Jule Ser, PT 05/20/2019, 2:47 PM  [facility] [address], [place], [place], [address] Phone: [phone]   Fax:  [phone]  Name: AIVREE BOYD MRN: [id number] Date of Birth: October 22, 1945

## 2019-06-01 ENCOUNTER — Encounter: Payer: Self-pay | Admitting: Physical Therapy

## 2019-06-01 ENCOUNTER — Other Ambulatory Visit: Payer: Self-pay

## 2019-06-01 ENCOUNTER — Ambulatory Visit: Payer: Medicare Other | Admitting: Physical Therapy

## 2019-06-01 DIAGNOSIS — R296 Repeated falls: Secondary | ICD-10-CM | POA: Diagnosis not present

## 2019-06-01 DIAGNOSIS — R279 Unspecified lack of coordination: Secondary | ICD-10-CM

## 2019-06-01 DIAGNOSIS — M6281 Muscle weakness (generalized): Secondary | ICD-10-CM

## 2019-06-01 NOTE — Therapy (Signed)
[facility] [address], [place], [place], [address] Phone: [phone]   Fax:  [phone]  Physical Therapy Treatment  Patient Details  Name: [name] MRN: [id number] Date of Birth: [birth year] Referring Provider (PT): Kathie Rhodes, MD   Encounter Date: 06/01/2019  PT End of Session - 06/01/19 1027    Visit Number  14    Number of Visits  20    Date for PT Re-Evaluation  06/22/19    Authorization Type  medicare    PT Start Time  1019    PT Stop Time  1057    PT Time Calculation (min)  38 min    Activity Tolerance  Patient tolerated treatment well    Behavior During Therapy  [facility] for tasks assessed/performed       Past Medical History:  Diagnosis Date  . Adenomatous colon polyp 03/1988  . Anemia    borderline  . Arthritis    R shoulder - degenerative   . Cataract    beginning stages  . Chest pain     Sept 2011: stress test neg  . Depression    sees Dr.Cotle  . Diabetes mellitus    dr Buddy Duty  . Diverticulosis   . Fatty liver    Increased LFTs, saw GI 06/2011, likely from fatty liver   . GERD (gastroesophageal reflux disease)   . History of hiatal hernia   . Hyperlipemia   . Hypertension   . IBS (irritable bowel syndrome)    and dyspepsia  . Memory impairment 08/2014   mild cognitive impairment vs. mild dementia, reommended reinstating of cholinesterase inhibitor   . Osteopenia    dexa 06/2007 and 10/11 Rx CA vitamin d  . RLS (restless legs syndrome)    h/o- no longer using Requip    Past Surgical History:  Procedure Laterality Date  . BREAST BIOPSY Left 10/11/2015   fibroadenoma w/ calcifications, no evidence of malignancy, recommend mammogram repeat-6 mnths  . POLYPECTOMY    . REVERSE SHOULDER ARTHROPLASTY Right 04/21/2014   Procedure: REVERSE SHOULDER ARTHROPLASTY;  Surgeon: Tania Ade, MD;  Location: [place];  Service: Orthopedics;  Laterality: Right;  Right reverse total shoulder  replacement  . TONSILLECTOMY    . UTERINE FIBROID EMBOLIZATION     90s    There were no vitals filed for this visit.  Subjective Assessment - 06/01/19 1026    Subjective  Wear regular underwear when I know I will be around the house.    Patient Stated Goals  wear regular underwear again    Currently in Pain?  No/denies                        [facility] Adult PT Treatment/Exercise - 06/01/19 0001      Lumbar Exercises: Aerobic   Nustep  L1 x 5 min PT present to monitor and cue to engage core      Lumbar Exercises: Standing   Heel Raises  20 reps   on foam mat   Heel Raises Limitations  core braced and hip series in standing 3 ways - 2.5 lb 10x each    Other Standing Lumbar Exercises  toe tapping on 6" - 20x no UE support; close sup    Other Standing Lumbar Exercises  tandem standing with head turns 10x each side;  no UE support close sup-CGA      Lumbar Exercises: Seated   Long Arc Quad on Chair  Strengthening;Both;20 reps    LAQ on Chair Limitations  2.5 lb    Sit to Stand  10 reps   on foam mat on chair     single leg stand x 1-2 sec 10x each side         PT Short Term Goals - 04/27/19 1220      PT SHORT TERM GOAL #3   Title  Pt will be able to perform 5 x sit to stand with no UE assistance    Status  Achieved        PT Long Term Goals - 04/27/19 1221      PT LONG TERM GOAL #1   Title  Pt will report at least 50% less urinary and fecal leakage     Baseline  definitely better, about 50% but maybe not quite    Status  On-going      PT LONG TERM GOAL #2   Title  pt will be ind with advanced HEP    Status  On-going      PT LONG TERM GOAL #3   Title  Pt will report needing only 1 pad/depends per day    Baseline  2/day and still putting a chuck pad    Status  On-going      PT LONG TERM GOAL #4   Title  Pt will demonstrate 5x sit to stand in <13 seconds for reduced risk of falls    Baseline  30 sec            Plan - 06/01/19 1051     Clinical Impression Statement  Pt responded well to treatment.  Today's session with more balance related exercises incoorporated into the core and pelvic floor ex.  Pt did well on nustep today without pain . Overall, she was monitored for pain throughout and had less complaint of knee pain.  Pt will continue to benefit from skilled PT to work towards improved bladder function and safety at home.    PT Treatment/Interventions  ADLs/Self Care Home Management;Biofeedback;Cryotherapy;Electrical Stimulation;Moist Heat;Neuromuscular re-education;Therapeutic exercise;Therapeutic activities;Taping;Dry needling;Passive range of motion;Patient/family education;Manual techniques    PT Next Visit Plan  single leg exercises; LE strength and core strength.  sit to stand and squat progression as tolerated    PT Home Exercise Plan  Access Code: [email or web address]    Consulted and Agree with Plan of Care  Patient       Patient will benefit from skilled therapeutic intervention in order to improve the following deficits and impairments:  Abnormal gait, Decreased coordination, Pain, Postural dysfunction, Decreased strength  Visit Diagnosis: Repeated falls  Muscle weakness (generalized)  Unspecified lack of coordination     Problem List Patient Active Problem List   Diagnosis Date Noted  . Dark stools 03/17/2017  . SIADH (syndrome of inappropriate ADH production) ([place]) 10/10/2016  . Gastroesophageal reflux disease 08/06/2016  . PCP Notes >>>>>>>>>>>>>>>>> 09/21/2014  . Rotator cuff tear arthropathy 04/21/2014  . Abnormal MRI, shoulder 12/01/2013  . 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 Cervical radiculitis 11/10/2013  . Bursitis of right shoulder 08/19/2013  . Primary localized osteoarthrosis, lower leg 08/19/2013  . Vitamin B 12 deficiency 05/20/2013  . OSA (obstructive sleep apnea) 02/13/2012  . Pulmonary nodule, last  CT 2016, stable, no f/u 01/28/2012  . Dementia ([place]) 05/29/2011  . Fatty liver  02/27/2011  . Annual physical exam 11/26/2010  . Osteopenia 10/09/2007  . COLONIC POLYPS 08/22/2006  . DM II (diabetes mellitus, type II), controlled ([place])  05/19/2006  . Hyperlipidemia 05/19/2006  . Depression 05/19/2006  . SYNDROME, RESTLESS LEGS 05/19/2006  . Essential hypertension 05/19/2006  . IBS -- constipation 05/19/2006    Jule Ser, PT 06/01/2019, 10:56 AM  [facility] [address], [place], [place], [address] Phone: [phone]   Fax:  [phone]  Name: LASONJA SPEHAR MRN: [id number] Date of Birth: Oct 05, 1945

## 2019-06-03 ENCOUNTER — Other Ambulatory Visit: Payer: Self-pay

## 2019-06-03 ENCOUNTER — Ambulatory Visit: Payer: Medicare Other | Admitting: Physical Therapy

## 2019-06-03 DIAGNOSIS — R296 Repeated falls: Secondary | ICD-10-CM | POA: Diagnosis not present

## 2019-06-03 DIAGNOSIS — M6281 Muscle weakness (generalized): Secondary | ICD-10-CM

## 2019-06-03 DIAGNOSIS — R279 Unspecified lack of coordination: Secondary | ICD-10-CM

## 2019-06-03 NOTE — Therapy (Addendum)
[facility] [address] [place], [place], [address] Phone: [phone]   Fax:  [phone]  Physical Therapy Treatment  Patient Details  Name: [name] MRN: [id number] Date of Birth: 06/27/1945 Referring Provider (PT): Kathie Rhodes, MD   Encounter Date: 06/03/2019  PT End of Session - 06/03/19 1418    Visit Number  15    Number of Visits  20    Date for PT Re-Evaluation  06/22/19    Authorization Type  medicare    PT Start Time  1406    PT Stop Time  1444    PT Time Calculation (min)  38 min    Activity Tolerance  Patient tolerated treatment well    Behavior During Therapy  [facility] for tasks assessed/performed       Past Medical History:  Diagnosis Date  . Adenomatous colon polyp 03/1988  . Anemia    borderline  . Arthritis    R shoulder - degenerative   . Cataract    beginning stages  . Chest pain     Sept 2011: stress test neg  . Depression    sees Dr.Cotle  . Diabetes mellitus    dr Buddy Duty  . Diverticulosis   . Fatty liver    Increased LFTs, saw GI 06/2011, likely from fatty liver   . GERD (gastroesophageal reflux disease)   . History of hiatal hernia   . Hyperlipemia   . Hypertension   . IBS (irritable bowel syndrome)    and dyspepsia  . Memory impairment 08/2014   mild cognitive impairment vs. mild dementia, reommended reinstating of cholinesterase inhibitor   . Osteopenia    dexa 06/2007 and 10/11 Rx CA vitamin d  . RLS (restless legs syndrome)    h/o- no longer using Requip    Past Surgical History:  Procedure Laterality Date  . BREAST BIOPSY Left 10/11/2015   fibroadenoma w/ calcifications, no evidence of malignancy, recommend mammogram repeat-6 mnths  . POLYPECTOMY    . REVERSE SHOULDER ARTHROPLASTY Right 04/21/2014   Procedure: REVERSE SHOULDER ARTHROPLASTY;  Surgeon: Tania Ade, MD;  Location: [place];  Service: Orthopedics;  Laterality: Right;  Right reverse total shoulder  replacement  . TONSILLECTOMY    . UTERINE FIBROID EMBOLIZATION     90s    There were no vitals filed for this visit.  Subjective Assessment - 06/03/19 1414    Subjective  I had an accident with fecal leakage today.  Pt states she is 60% better and wearing 2 depends/day. Pt is still having knee pain but that is also somewhat better.    Patient Stated Goals  wear regular underwear again    Currently in Pain?  Yes    Pain Score  5     Pain Location  Knee    Pain Orientation  Right;Left    Pain Descriptors / Indicators  Aching    Pain Type  Chronic pain    Pain Onset  More than a month ago    Pain Frequency  Intermittent    Pain Relieving Factors  knee brace for patella control    Multiple Pain Sites  No         [facility] PT Assessment - 06/06/19 0001      Assessment   Medical Diagnosis  N39.41 (ICD-10-CM) - Urge incontinence    Referring Provider (PT)  Kathie Rhodes, MD  [place] Adult PT Treatment/Exercise - 06/06/19 0001      Transfers   Five time sit to stand comments   22 sec without UE support      Neuro Re-ed    Neuro Re-ed Details   TC to assess lifting and holding during exercises in supine and seated      Lumbar Exercises: Aerobic   Nustep  L1 x 8 min PT present to monitor and cue to engage core      Lumbar Exercises: Standing   Heel Raises  20 reps   on foam mat   Heel Raises Limitations  standing on foam mat      Lumbar Exercises: Seated   Long Arc Quad on Chair  Strengthening;Both;20 reps    LAQ on Chair Limitations  2.5 lb    Sit to Stand  5 reps   from chair x 2 sets     Lumbar Exercises: Supine   Bridge with [employer]  20 reps    Bridge with [employer] Limitations  kegel with ball squeeze    Other Supine Lumbar Exercises  bent knee fallout - 10x each side               PT Short Term Goals - 04/27/19 1220      PT SHORT TERM GOAL #3   Title  Pt will be able to perform 5 x sit to stand with no UE assistance     Status  Achieved        PT Long Term Goals - 06/03/19 1412      PT LONG TERM GOAL #1   Title  Pt will report at least 50% less urinary and fecal leakage     Baseline  at least 60% improved    Status  Achieved      PT LONG TERM GOAL #2   Title  pt will be ind with advanced HEP    Status  Achieved      PT LONG TERM GOAL #3   Title  Pt will report needing only 1 pad/depends per day    Baseline  2/day and still using a chuck pad at night    Time  8    Period  Weeks    Status  Partially Met    Target Date  07/29/19      PT LONG TERM GOAL #4   Title  Pt will demonstrate 5x sit to stand in <13 seconds for reduced risk of falls    Baseline  22 sec no use of UE    Time  8    Period  Weeks    Status  Partially Met    Target Date  07/29/19            Plan - 06/06/19 1056    Clinical Impression Statement  Pt has been making steady progress towards goals and benefitting from working on knee strength.  It has helped with her balance and sit to stand results are improved to 22 sec and able to perform without UE support.  Pt is able to porgress strengthening exercises for her core and pelvic floor as her knee allows her to do more today.  Pt will benefit from continued skilled PT to work towards goals.  She is still using pads and demonstrates increased risk of falls and is expected to continue to porgress at this time.    Personal Factors and Comorbidities  Age;Time since onset of injury/illness/exacerbation;Comorbidity 2  Comorbidities  impaired memory; knee OA    Examination-Activity Limitations  Toileting;Continence    Examination-Participation Restrictions  Community Activity    Rehab Potential  Excellent    PT Frequency  2x / week    PT Duration  8 weeks    PT Treatment/Interventions  ADLs/Self Care Home Management;Biofeedback;Cryotherapy;Electrical Stimulation;Moist Heat;Neuromuscular re-education;Therapeutic exercise;Therapeutic activities;Taping;Dry needling;Passive  range of motion;Patient/family education;Manual techniques    PT Next Visit Plan  single leg exercises; LE strength and core strength.  sit to stand and squat progression as tolerated    PT Home Exercise Plan  Access Code: [email or web address]    Consulted and Agree with Plan of Care  Patient       Patient will benefit from skilled therapeutic intervention in order to improve the following deficits and impairments:  Abnormal gait, Decreased coordination, Pain, Postural dysfunction, Decreased strength  Visit Diagnosis: Repeated falls  Muscle weakness (generalized)  Unspecified lack of coordination     Problem List Patient Active Problem List   Diagnosis Date Noted  . Dark stools 03/17/2017  . SIADH (syndrome of inappropriate ADH production) ([place]) 10/10/2016  . Gastroesophageal reflux disease 08/06/2016  . PCP Notes >>>>>>>>>>>>>>>>> 09/21/2014  . Rotator cuff tear arthropathy 04/21/2014  . Abnormal MRI, shoulder 12/01/2013  . 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 Cervical radiculitis 11/10/2013  . Bursitis of right shoulder 08/19/2013  . Primary localized osteoarthrosis, lower leg 08/19/2013  . Vitamin B 12 deficiency 05/20/2013  . OSA (obstructive sleep apnea) 02/13/2012  . Pulmonary nodule, last  CT 2016, stable, no f/u 01/28/2012  . Dementia ([place]) 05/29/2011  . Fatty liver 02/27/2011  . Annual physical exam 11/26/2010  . Osteopenia 10/09/2007  . COLONIC POLYPS 08/22/2006  . DM II (diabetes mellitus, type II), controlled ([place]) 05/19/2006  . Hyperlipidemia 05/19/2006  . Depression 05/19/2006  . SYNDROME, RESTLESS LEGS 05/19/2006  . Essential hypertension 05/19/2006  . IBS -- constipation 05/19/2006    Jule Ser, PT 06/06/2019, 10:59 AM  [facility] [address] [place], [place], [address] Phone: [phone]   Fax:  [phone]  Name: [name] MRN: [id number] Date of Birth:  1945/11/17

## 2019-06-06 NOTE — Addendum Note (Signed)
Addended by: Jari Favre L on: 06/06/2019 11:01 AM   Modules accepted: Orders

## 2019-06-09 ENCOUNTER — Encounter: Payer: Medicare Other | Admitting: Physical Therapy

## 2019-06-10 ENCOUNTER — Other Ambulatory Visit: Payer: Self-pay

## 2019-06-10 ENCOUNTER — Encounter: Payer: Self-pay | Admitting: Physical Therapy

## 2019-06-10 ENCOUNTER — Ambulatory Visit: Payer: Medicare Other | Attending: Urology | Admitting: Physical Therapy

## 2019-06-10 DIAGNOSIS — M6281 Muscle weakness (generalized): Secondary | ICD-10-CM | POA: Insufficient documentation

## 2019-06-10 DIAGNOSIS — R279 Unspecified lack of coordination: Secondary | ICD-10-CM | POA: Diagnosis present

## 2019-06-10 DIAGNOSIS — R296 Repeated falls: Secondary | ICD-10-CM | POA: Diagnosis not present

## 2019-06-10 NOTE — Patient Instructions (Signed)
Access Code: [email or web address] URL: [email or web address] Date: 04/15/2019 Prepared by: Jari Favre  Exercises Supine Hip Adductor Squeeze with Small Ball - 3 x daily - 7 x weekly - 10 reps - 1 sets - 5 sec hold Seated Pelvic Floor Contraction with Isometric Hip Adduction - 3 x daily - 7 x weekly - 10 reps - 1 sets - 3 sechold, 3 sec rest hold Sit to stand pelvic - blow as you go - 3 x daily - 7 x weekly - 10 reps - 1 sets Seated Long Arc Quad with Hip Adduction - 1 x daily - 7 x weekly - 10 reps - 3 sets Hooklying Clamshells with Resistance - 1 x daily - 7 x weekly - 3 sets - 10 reps

## 2019-06-10 NOTE — Therapy (Signed)
[facility] [address] [place], [place], [address] Phone: [phone]   Fax:  [phone]  Physical Therapy Treatment  Patient Details  Name: [name] MRN: [id number] Date of Birth: 06-05-1945 Referring Provider (PT): Kathie Rhodes, MD   Encounter Date: 06/10/2019  PT End of Session - 06/10/19 1420    Visit Number  16    Number of Visits  20    Date for PT Re-Evaluation  07/29/19    Authorization Type  medicare    PT Start Time  1416    PT Stop Time  1456    PT Time Calculation (min)  40 min    Activity Tolerance  Patient tolerated treatment well    Behavior During Therapy  [facility] for tasks assessed/performed       Past Medical History:  Diagnosis Date  . Adenomatous colon polyp 03/1988  . Anemia    borderline  . Arthritis    R shoulder - degenerative   . Cataract    beginning stages  . Chest pain     Sept 2011: stress test neg  . Depression    sees Dr.Cotle  . Diabetes mellitus    dr Buddy Duty  . Diverticulosis   . Fatty liver    Increased LFTs, saw GI 06/2011, likely from fatty liver   . GERD (gastroesophageal reflux disease)   . History of hiatal hernia   . Hyperlipemia   . Hypertension   . IBS (irritable bowel syndrome)    and dyspepsia  . Memory impairment 08/2014   mild cognitive impairment vs. mild dementia, reommended reinstating of cholinesterase inhibitor   . Osteopenia    dexa 06/2007 and 10/11 Rx CA vitamin d  . RLS (restless legs syndrome)    h/o- no longer using Requip    Past Surgical History:  Procedure Laterality Date  . BREAST BIOPSY Left 10/11/2015   fibroadenoma w/ calcifications, no evidence of malignancy, recommend mammogram repeat-6 mnths  . POLYPECTOMY    . REVERSE SHOULDER ARTHROPLASTY Right 04/21/2014   Procedure: REVERSE SHOULDER ARTHROPLASTY;  Surgeon: Tania Ade, MD;  Location: [place];  Service: Orthopedics;  Laterality: Right;  Right reverse total shoulder  replacement  . TONSILLECTOMY    . UTERINE FIBROID EMBOLIZATION     90s    There were no vitals filed for this visit.  Subjective Assessment - 06/10/19 1429    Subjective  Pt reports she is feeling about the same as far as leakage.  Knee pain is noticeably worse today.    Patient Stated Goals  wear regular underwear again    Currently in Pain?  Yes    Pain Score  2     Pain Location  Knee    Pain Orientation  Right;Left    Pain Descriptors / Indicators  Aching    Pain Type  Chronic pain    Pain Onset  More than a month ago    Aggravating Factors   going from sit to stand    Multiple Pain Sites  No                     Pelvic Floor Special Questions - 06/10/19 0001    Perineal Body/Introitus   Gaping    Pelvic Floor Internal Exam  pt identity confirmed and informed consent given to perform internal soft tissue cueing    Palpation  improved coordination - quick stretch, tapping and stroking to help activation of pelvic  floor    Strength  weak squeeze, no lift    Strength # of seconds  5        [facility] Adult PT Treatment/Exercise - 06/10/19 0001      Lumbar Exercises: Aerobic   Nustep  L1 x 3 min PT present to monitor and cue to engage core   had increased pain today     Lumbar Exercises: Standing   Heel Raises  20 reps   on foam mat   Heel Raises Limitations  standing on foam mat    Other Standing Lumbar Exercises  hip abduction and knee flexion standing on foam mat -10x each side      Lumbar Exercises: Supine   Bent Knee Raise  5 reps    Bent Knee Raise Limitations  kegel    Bridge with Ball Squeeze Limitations  kegel with ball squeeze no bridge - cues to make sure fully relaxed before engaging muscles             PT Education - 06/10/19 1450    Education Details  reviewed and added time to ball squeeze kegel    Person(s) Educated  Patient    Methods  Explanation;Demonstration;Verbal cues;Tactile cues;Handout    Comprehension  Verbalized  understanding;Returned demonstration       PT Short Term Goals - 04/27/19 1220      PT SHORT TERM GOAL #3   Title  Pt will be able to perform 5 x sit to stand with no UE assistance    Status  Achieved        PT Long Term Goals - 06/03/19 1412      PT LONG TERM GOAL #1   Title  Pt will report at least 50% less urinary and fecal leakage     Baseline  at least 60% improved    Status  Achieved      PT LONG TERM GOAL #2   Title  pt will be ind with advanced HEP    Status  Achieved      PT LONG TERM GOAL #3   Title  Pt will report needing only 1 pad/depends per day    Baseline  2/day and still using a chuck pad at night    Time  8    Period  Weeks    Status  Partially Met    Target Date  07/29/19      PT LONG TERM GOAL #4   Title  Pt will demonstrate 5x sit to stand in <13 seconds for reduced risk of falls    Baseline  22 sec no use of UE    Time  8    Period  Weeks    Status  Partially Met    Target Date  07/29/19            Plan - 06/10/19 1518    Clinical Impression Statement  Improving pelvic floor contraction to 2/5 and holding for 5 seconds.  Pt reports she had not been doing the ball squeeze exercise but she was getting the best pelvic floor contraction with using ball.  pt educated on doing this exercise more regularly throughout this next week.  Pt will benefit from skilled PT to continue working on strength and coordination for return to greater continence for ability to participate and improve quality of life.    PT Treatment/Interventions  ADLs/Self Care Home Management;Biofeedback;Cryotherapy;Electrical Stimulation;Moist Heat;Neuromuscular re-education;Therapeutic exercise;Therapeutic activities;Taping;Dry needling;Passive range of motion;Patient/family education;Manual techniques    PT  Next Visit Plan  f/u on supine with ball squeeze; single leg exercises; LE strength and core strength.  sit to stand and squat progression as tolerated    PT Home Exercise  Plan  Access Code: [email or web address]    Consulted and Agree with Plan of Care  Patient       Patient will benefit from skilled therapeutic intervention in order to improve the following deficits and impairments:  Abnormal gait, Decreased coordination, Pain, Postural dysfunction, Decreased strength  Visit Diagnosis: Repeated falls  Muscle weakness (generalized)  Unspecified lack of coordination     Problem List Patient Active Problem List   Diagnosis Date Noted  . Dark stools 03/17/2017  . SIADH (syndrome of inappropriate ADH production) ([place]) 10/10/2016  . Gastroesophageal reflux disease 08/06/2016  . PCP Notes >>>>>>>>>>>>>>>>> 09/21/2014  . Rotator cuff tear arthropathy 04/21/2014  . Abnormal MRI, shoulder 12/01/2013  . 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 Cervical radiculitis 11/10/2013  . Bursitis of right shoulder 08/19/2013  . Primary localized osteoarthrosis, lower leg 08/19/2013  . Vitamin B 12 deficiency 05/20/2013  . OSA (obstructive sleep apnea) 02/13/2012  . Pulmonary nodule, last  CT 2016, stable, no f/u 01/28/2012  . Dementia ([place]) 05/29/2011  . Fatty liver 02/27/2011  . Annual physical exam 11/26/2010  . Osteopenia 10/09/2007  . COLONIC POLYPS 08/22/2006  . DM II (diabetes mellitus, type II), controlled ([place]) 05/19/2006  . Hyperlipidemia 05/19/2006  . Depression 05/19/2006  . SYNDROME, RESTLESS LEGS 05/19/2006  . Essential hypertension 05/19/2006  . IBS -- constipation 05/19/2006    Jule Ser, PT 06/10/2019, 3:23 PM  [facility] [address], [place], [place], [address] Phone: [phone]   Fax:  [phone]  Name: [name] MRN: [id number] Date of Birth: 06-Jun-1945

## 2019-06-15 ENCOUNTER — Ambulatory Visit: Payer: Medicare Other | Admitting: Physical Therapy

## 2019-06-15 ENCOUNTER — Encounter: Payer: Self-pay | Admitting: Physical Therapy

## 2019-06-15 ENCOUNTER — Other Ambulatory Visit: Payer: Self-pay

## 2019-06-15 DIAGNOSIS — R296 Repeated falls: Secondary | ICD-10-CM | POA: Diagnosis not present

## 2019-06-15 DIAGNOSIS — M6281 Muscle weakness (generalized): Secondary | ICD-10-CM

## 2019-06-15 DIAGNOSIS — R279 Unspecified lack of coordination: Secondary | ICD-10-CM

## 2019-06-15 NOTE — Patient Instructions (Signed)
[email or web address]

## 2019-06-15 NOTE — Therapy (Signed)
[facility] [address] [place], [place], [address] Phone: [phone]   Fax:  [phone]  Physical Therapy Treatment  Patient Details  Name: [name] MRN: [id number] Date of Birth: Oct 05, 1945 Referring Provider (PT): Kathie Rhodes, MD   Encounter Date: 06/15/2019  PT End of Session - 06/15/19 0809    Visit Number  17    Number of Visits  20    Date for PT Re-Evaluation  07/29/19    Authorization Type  medicare    PT Start Time  0803    PT Stop Time  0843    PT Time Calculation (min)  40 min    Activity Tolerance  Patient tolerated treatment well    Behavior During Therapy  [facility] for tasks assessed/performed       Past Medical History:  Diagnosis Date  . Adenomatous colon polyp 03/1988  . Anemia    borderline  . Arthritis    R shoulder - degenerative   . Cataract    beginning stages  . Chest pain     Sept 2011: stress test neg  . Depression    sees Dr.Cotle  . Diabetes mellitus    dr Buddy Duty  . Diverticulosis   . Fatty liver    Increased LFTs, saw GI 06/2011, likely from fatty liver   . GERD (gastroesophageal reflux disease)   . History of hiatal hernia   . Hyperlipemia   . Hypertension   . IBS (irritable bowel syndrome)    and dyspepsia  . Memory impairment 08/2014   mild cognitive impairment vs. mild dementia, reommended reinstating of cholinesterase inhibitor   . Osteopenia    dexa 06/2007 and 10/11 Rx CA vitamin d  . RLS (restless legs syndrome)    h/o- no longer using Requip    Past Surgical History:  Procedure Laterality Date  . BREAST BIOPSY Left 10/11/2015   fibroadenoma w/ calcifications, no evidence of malignancy, recommend mammogram repeat-6 mnths  . POLYPECTOMY    . REVERSE SHOULDER ARTHROPLASTY Right 04/21/2014   Procedure: REVERSE SHOULDER ARTHROPLASTY;  Surgeon: Tania Ade, MD;  Location: [place];  Service: Orthopedics;  Laterality: Right;  Right reverse total shoulder  replacement  . TONSILLECTOMY    . UTERINE FIBROID EMBOLIZATION     90s    There were no vitals filed for this visit.  Subjective Assessment - 06/15/19 0814    Subjective  I think the ball squeeze helped.  I am almost ready to go back to regular underwear. Pt reports knees are feeling good this morning    Patient Stated Goals  wear regular underwear again    Currently in Pain?  No/denies                        [facility] Adult PT Treatment/Exercise - 06/15/19 0001      Lumbar Exercises: Aerobic   UBE (Upper Arm Bike)  L1 3/3 fwd/back - PT present to get status update      Lumbar Exercises: Machines for Strengthening   Leg Press  bilat 75# with ball squeeze - seat 8 - 20 x      Lumbar Exercises: Standing   Other Standing Lumbar Exercises  hip flexion toe tap and extension - 2.5lb - 10x each side with CGA and min UE used      Lumbar Exercises: Seated   Long Arc Quad on Chair  Strengthening;Both;20 reps    LAQ on Chair  Limitations  2.5 lb      Lumbar Exercises: Supine   Bridge with Ball Squeeze Limitations  kegel with ball squeeze no bridge - cues to make sure fully relaxed before engaging muscles      Lumbar Exercises: Sidelying   Clam  Right;Left;15 reps   pelvic floor engaged   Other Sidelying Lumbar Exercises  hip adduction - 15 x both sides with kegel             PT Education - 06/15/19 0844    Education Details  [email or web address]    Person(s) Educated  Patient    Methods  Explanation;Demonstration;Tactile cues;Verbal cues;Handout    Comprehension  Verbalized understanding;Returned demonstration       PT Short Term Goals - 04/27/19 1220      PT SHORT TERM GOAL #3   Title  Pt will be able to perform 5 x sit to stand with no UE assistance    Status  Achieved        PT Long Term Goals - 06/03/19 1412      PT LONG TERM GOAL #1   Title  Pt will report at least 50% less urinary and fecal leakage     Baseline  at least 60% improved    Status  Achieved       PT LONG TERM GOAL #2   Title  pt will be ind with advanced HEP    Status  Achieved      PT LONG TERM GOAL #3   Title  Pt will report needing only 1 pad/depends per day    Baseline  2/day and still using a chuck pad at night    Time  8    Period  Weeks    Status  Partially Met    Target Date  07/29/19      PT LONG TERM GOAL #4   Title  Pt will demonstrate 5x sit to stand in <13 seconds for reduced risk of falls    Baseline  22 sec no use of UE    Time  8    Period  Weeks    Status  Partially Met    Target Date  07/29/19            Plan - 06/15/19 0847    Clinical Impression Statement  Pt did well with exercises and did not experience any knee pain today.  Pt did exercises focused on pelvic floor contraction during functional movements and coordinating with LE movements.  She was also able to do pelvic floor activation while doing balance related activities.  Pt has LOB with lifting one leg and cannot stand on single leg longer than 1 second or less. Pt needed some CGA and close supervision during these exercises.  She was able to correct LOB with min UE support x2.  Pt will benefit from skilled PT to work on balance and pelvic floor strength.    PT Treatment/Interventions  ADLs/Self Care Home Management;Biofeedback;Cryotherapy;Electrical Stimulation;Moist Heat;Neuromuscular re-education;Therapeutic exercise;Therapeutic activities;Taping;Dry needling;Passive range of motion;Patient/family education;Manual techniques    PT Next Visit Plan  f/u on supine with ball squeeze; single leg exercises; hip adduction in sidelying; LE strength and core strength.  sit to stand and squat progression as tolerated    PT Home Exercise Plan  Access Code: [email or web address]    Consulted and Agree with Plan of Care  Patient       Patient will benefit from skilled therapeutic intervention in order to improve the following  deficits and impairments:  Abnormal gait, Decreased coordination, Pain, Postural  dysfunction, Decreased strength  Visit Diagnosis: Repeated falls  Muscle weakness (generalized)  Unspecified lack of coordination     Problem List Patient Active Problem List   Diagnosis Date Noted  . Dark stools 03/17/2017  . SIADH (syndrome of inappropriate ADH production) ([place]) 10/10/2016  . Gastroesophageal reflux disease 08/06/2016  . PCP Notes >>>>>>>>>>>>>>>>> 09/21/2014  . Rotator cuff tear arthropathy 04/21/2014  . Abnormal MRI, shoulder 12/01/2013  . 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 Cervical radiculitis 11/10/2013  . Bursitis of right shoulder 08/19/2013  . Primary localized osteoarthrosis, lower leg 08/19/2013  . Vitamin B 12 deficiency 05/20/2013  . OSA (obstructive sleep apnea) 02/13/2012  . Pulmonary nodule, last  CT 2016, stable, no f/u 01/28/2012  . Dementia ([place]) 05/29/2011  . Fatty liver 02/27/2011  . Annual physical exam 11/26/2010  . Osteopenia 10/09/2007  . COLONIC POLYPS 08/22/2006  . DM II (diabetes mellitus, type II), controlled ([place]) 05/19/2006  . Hyperlipidemia 05/19/2006  . Depression 05/19/2006  . SYNDROME, RESTLESS LEGS 05/19/2006  . Essential hypertension 05/19/2006  . IBS -- constipation 05/19/2006    Jule Ser, PT 06/15/2019, 8:58 AM  [facility] [address] [place], [place], [address] Phone: [phone]   Fax:  [phone]  Name: BRIELYN BOSAK MRN: [id number] Date of Birth: Mar 08, 1945

## 2019-06-18 ENCOUNTER — Ambulatory Visit: Payer: Medicare Other | Admitting: Internal Medicine

## 2019-06-24 ENCOUNTER — Ambulatory Visit: Payer: Medicare Other | Admitting: Podiatrist

## 2019-06-29 ENCOUNTER — Ambulatory Visit: Payer: Medicare Other | Admitting: Physical Therapy

## 2019-06-29 ENCOUNTER — Other Ambulatory Visit: Payer: Self-pay

## 2019-06-29 DIAGNOSIS — M6281 Muscle weakness (generalized): Secondary | ICD-10-CM

## 2019-06-29 DIAGNOSIS — R296 Repeated falls: Secondary | ICD-10-CM | POA: Diagnosis not present

## 2019-06-29 DIAGNOSIS — R279 Unspecified lack of coordination: Secondary | ICD-10-CM

## 2019-06-29 NOTE — Therapy (Signed)
[facility] [address], [place], [place], [address] Phone: [phone]   Fax:  [phone]  Physical Therapy Treatment  Patient Details  Name: [name] MRN: [id number] Date of Birth: Oct 22, 1945 Referring Provider (PT): Kathie Rhodes, MD   Encounter Date: 06/29/2019   PT End of Session - 06/29/19 1240    Visit Number 18    Date for PT Re-Evaluation 07/29/19    PT Start Time 1233    PT Stop Time 1313    PT Time Calculation (min) 40 min    Activity Tolerance Patient tolerated treatment well    Behavior During Therapy [facility] for tasks assessed/performed           Past Medical History:  Diagnosis Date  . Adenomatous colon polyp 03/1988  . Anemia    borderline  . Arthritis    R shoulder - degenerative   . Cataract    beginning stages  . Chest pain     Sept 2011: stress test neg  . Depression    sees Dr.Cotle  . Diabetes mellitus    dr Buddy Duty  . Diverticulosis   . Fatty liver    Increased LFTs, saw GI 06/2011, likely from fatty liver   . GERD (gastroesophageal reflux disease)   . History of hiatal hernia   . Hyperlipemia   . Hypertension   . IBS (irritable bowel syndrome)    and dyspepsia  . Memory impairment 08/2014   mild cognitive impairment vs. mild dementia, reommended reinstating of cholinesterase inhibitor   . Osteopenia    dexa 06/2007 and 10/11 Rx CA vitamin d  . RLS (restless legs syndrome)    h/o- no longer using Requip    Past Surgical History:  Procedure Laterality Date  . BREAST BIOPSY Left 10/11/2015   fibroadenoma w/ calcifications, no evidence of malignancy, recommend mammogram repeat-6 mnths  . POLYPECTOMY    . REVERSE SHOULDER ARTHROPLASTY Right 04/21/2014   Procedure: REVERSE SHOULDER ARTHROPLASTY;  Surgeon: Tania Ade, MD;  Location: [facility];  Service: Orthopedics;  Laterality: Right;  Right reverse total shoulder replacement  . TONSILLECTOMY    . UTERINE FIBROID  EMBOLIZATION     90s    There were no vitals filed for this visit.   Subjective Assessment - 06/29/19 1247    Subjective I maybe had leakage a couple of times.  I put regular underwear on yesterday, but sometimes still wearing a pad.    Patient Stated Goals wear regular underwear again    Currently in Pain? No/denies                             [facility] Adult PT Treatment/Exercise - 06/29/19 0001      Lumbar Exercises: Standing   Shoulder Extension Strengthening;Both;20 reps;Theraband    Theraband Level (Shoulder Extension) Level 2 (Red)    Other Standing Lumbar Exercises hip hinges with thigh agains mat table      Lumbar Exercises: Supine   AB Set Limitations ball squeeze; kegel and small roll on foam noodle    Clam 15 reps    Clam Limitations blue Tband    Bent Knee Raise 10 reps    Bent Knee Raise Limitations 2lb ankle weights    Bridge 10 reps;5 seconds                    PT Short Term Goals - 04/27/19 1220  PT SHORT TERM GOAL #3   Title Pt will be able to perform 5 x sit to stand with no UE assistance    Status Achieved             PT Long Term Goals - 06/03/19 1412      PT LONG TERM GOAL #1   Title Pt will report at least 50% less urinary and fecal leakage     Baseline at least 60% improved    Status Achieved      PT LONG TERM GOAL #2   Title pt will be ind with advanced HEP    Status Achieved      PT LONG TERM GOAL #3   Title Pt will report needing only 1 pad/depends per day    Baseline 2/day and still using a chuck pad at night    Time 8    Period Weeks    Status Partially Met    Target Date 07/29/19      PT LONG TERM GOAL #4   Title Pt will demonstrate 5x sit to stand in <13 seconds for reduced risk of falls    Baseline 22 sec no use of UE    Time 8    Period Weeks    Status Partially Met    Target Date 07/29/19                 Plan - 06/29/19 1359    Clinical Impression Statement Pt continues to have  pain in knees with standing up from seated position noted when she stands up in the waiting room.  Pt tolerated standing exercises and fatigues with standing exercises that she did at the first part of the session . pt did well with pelvic floor exercises today and able to progress.  Pt was encouraged to schedule times to do the exercises in her planner so she can remember to do them at home.    PT Treatment/Interventions ADLs/Self Care Home Management;Biofeedback;Cryotherapy;Electrical Stimulation;Moist Heat;Neuromuscular re-education;Therapeutic exercise;Therapeutic activities;Taping;Dry needling;Passive range of motion;Patient/family education;Manual techniques    PT Next Visit Plan f/u with scheduling exercises in her planner; single leg exercises for balance; hip adduction sidelying, hip hinges    PT Home Exercise Plan Access Code: [email or web address]    Consulted and Agree with Plan of Care Patient           Patient will benefit from skilled therapeutic intervention in order to improve the following deficits and impairments:  Abnormal gait, Decreased coordination, Pain, Postural dysfunction, Decreased strength  Visit Diagnosis: Repeated falls  Muscle weakness (generalized)  Unspecified lack of coordination     Problem List Patient Active Problem List   Diagnosis Date Noted  . Dark stools 03/17/2017  . SIADH (syndrome of inappropriate ADH production) ([place]) 10/10/2016  . Gastroesophageal reflux disease 08/06/2016  . PCP Notes >>>>>>>>>>>>>>>>> 09/21/2014  . Rotator cuff tear arthropathy 04/21/2014  . Abnormal MRI, shoulder 12/01/2013  . 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 Cervical radiculitis 11/10/2013  . Bursitis of right shoulder 08/19/2013  . Primary localized osteoarthrosis, lower leg 08/19/2013  . Vitamin B 12 deficiency 05/20/2013  . OSA (obstructive sleep apnea) 02/13/2012  . Pulmonary nodule, last  CT 2016, stable, no f/u 01/28/2012  . Dementia ([place]) 05/29/2011  .  Fatty liver 02/27/2011  . Annual physical exam 11/26/2010  . Osteopenia 10/09/2007  . COLONIC POLYPS 08/22/2006  . DM II (diabetes mellitus, type II), controlled ([place]) 05/19/2006  . Hyperlipidemia 05/19/2006  . Depression 05/19/2006  .  SYNDROME, RESTLESS LEGS 05/19/2006  . Essential hypertension 05/19/2006  . IBS -- constipation 05/19/2006    Jule Ser, PT 06/29/2019, 2:41 PM  [facility] [address] [place], [place], [address] Phone: [phone]   Fax:  [phone]  Name: ZAREA DIESING MRN: [id number] Date of Birth: Nov 01, 1945

## 2019-07-06 ENCOUNTER — Ambulatory Visit: Payer: Medicare Other | Admitting: Podiatry

## 2019-07-06 ENCOUNTER — Other Ambulatory Visit: Payer: Self-pay | Admitting: Internal Medicine

## 2019-07-06 ENCOUNTER — Other Ambulatory Visit: Payer: Self-pay

## 2019-07-06 ENCOUNTER — Ambulatory Visit: Payer: Medicare Other | Admitting: Physical Therapy

## 2019-07-06 DIAGNOSIS — R279 Unspecified lack of coordination: Secondary | ICD-10-CM

## 2019-07-06 DIAGNOSIS — M6281 Muscle weakness (generalized): Secondary | ICD-10-CM

## 2019-07-06 DIAGNOSIS — R296 Repeated falls: Secondary | ICD-10-CM

## 2019-07-06 NOTE — Therapy (Signed)
[facility] [address], [place], [place], [address] Phone: [phone]   Fax:  [phone]  Physical Therapy Treatment  Patient Details  Name: [name] MRN: [id number] Date of Birth: Jul 31, 1945 Referring Provider (PT): Kathie Rhodes, MD   Encounter Date: 07/06/2019   PT End of Session - 07/06/19 1248    Visit Number 19    Number of Visits 20    Date for PT Re-Evaluation 07/29/19    Authorization Type medicare    PT Start Time 1233    PT Stop Time 1313    PT Time Calculation (min) 40 min    Activity Tolerance Patient tolerated treatment well    Behavior During Therapy [facility] for tasks assessed/performed           Past Medical History:  Diagnosis Date  . Adenomatous colon polyp 03/1988  . Anemia    borderline  . Arthritis    R shoulder - degenerative   . Cataract    beginning stages  . Chest pain     Sept 2011: stress test neg  . Depression    sees Dr.Cotle  . Diabetes mellitus    dr Buddy Duty  . Diverticulosis   . Fatty liver    Increased LFTs, saw GI 06/2011, likely from fatty liver   . GERD (gastroesophageal reflux disease)   . History of hiatal hernia   . Hyperlipemia   . Hypertension   . IBS (irritable bowel syndrome)    and dyspepsia  . Memory impairment 08/2014   mild cognitive impairment vs. mild dementia, reommended reinstating of cholinesterase inhibitor   . Osteopenia    dexa 06/2007 and 10/11 Rx CA vitamin d  . RLS (restless legs syndrome)    h/o- no longer using Requip    Past Surgical History:  Procedure Laterality Date  . BREAST BIOPSY Left 10/11/2015   fibroadenoma w/ calcifications, no evidence of malignancy, recommend mammogram repeat-6 mnths  . POLYPECTOMY    . REVERSE SHOULDER ARTHROPLASTY Right 04/21/2014   Procedure: REVERSE SHOULDER ARTHROPLASTY;  Surgeon: Tania Ade, MD;  Location: [place];  Service: Orthopedics;  Laterality: Right;  Right reverse total shoulder  replacement  . TONSILLECTOMY    . UTERINE FIBROID EMBOLIZATION     90s    There were no vitals filed for this visit.   Subjective Assessment - 07/06/19 1407    Subjective Pt states she is feeling good today and the leakage was better. Denies knee pain today.  Pt reports she did not do the exercises at home very much    Currently in Pain? No/denies                             [facility] Adult PT Treatment/Exercise - 07/06/19 0001      Lumbar Exercises: Aerobic   UBE (Upper Arm Bike) L1 - 2 fwd/ 2 back      Lumbar Exercises: Standing   Heel Raises 20 reps   on foam mat   Heel Raises Limitations standing on foam mat    Shoulder Extension Strengthening;Both;20 reps;Theraband    Theraband Level (Shoulder Extension) Level 2 (Red)    Other Standing Lumbar Exercises hip abduction 10x each side - standing on foam pad      Lumbar Exercises: Seated   Sit to Stand 5 reps   from mat sitting on foam pad   Other Seated Lumbar Exercises clam with red band -  20x    Other Seated Lumbar Exercises hip ER red band; lifting 2lb weights 10x lifting to shoulder height      Knee/Hip Exercises: Seated   Long Arc Quad Strengthening;Right;Left;2 sets;15 reps   with ball squeeze                   PT Short Term Goals - 04/27/19 1220      PT SHORT TERM GOAL #3   Title Pt will be able to perform 5 x sit to stand with no UE assistance    Status Achieved             PT Long Term Goals - 06/03/19 1412      PT LONG TERM GOAL #1   Title Pt will report at least 50% less urinary and fecal leakage     Baseline at least 60% improved    Status Achieved      PT LONG TERM GOAL #2   Title pt will be ind with advanced HEP    Status Achieved      PT LONG TERM GOAL #3   Title Pt will report needing only 1 pad/depends per day    Baseline 2/day and still using a chuck pad at night    Time 8    Period Weeks    Status Partially Met    Target Date 07/29/19      PT LONG TERM GOAL  #4   Title Pt will demonstrate 5x sit to stand in <13 seconds for reduced risk of falls    Baseline 22 sec no use of UE    Time 8    Period Weeks    Status Partially Met    Target Date 07/29/19                 Plan - 07/06/19 1411    Clinical Impression Statement Pt did well with more standing exercises.  She continues to have a hard time with sit to stand and struggled on the last of 5 reps due to knee pain.  She tolerates single leg exercises well as long as not incorporating closed chain knee ext.  Pt was educated on importance of being able to remain consistent with her HEP and was recommended to mark in her planner for 1x/week as intial goal.  Pt needs 1-2 UE support with all standing exercises and close supervision due to balance deficits.  Pt will benefit from skilled PT to continue working on strength and balance.    PT Treatment/Interventions ADLs/Self Care Home Management;Biofeedback;Cryotherapy;Electrical Stimulation;Moist Heat;Neuromuscular re-education;Therapeutic exercise;Therapeutic activities;Taping;Dry needling;Passive range of motion;Patient/family education;Manual techniques    PT Next Visit Plan hip hinge; hip adduction and ER; f/u with her HEP scheduled in planner    PT Home Exercise Plan Access Code: [email or web address]    Consulted and Agree with Plan of Care Patient           Patient will benefit from skilled therapeutic intervention in order to improve the following deficits and impairments:  Abnormal gait, Decreased coordination, Pain, Postural dysfunction, Decreased strength  Visit Diagnosis: Repeated falls  Muscle weakness (generalized)  Unspecified lack of coordination     Problem List Patient Active Problem List   Diagnosis Date Noted  . Dark stools 03/17/2017  . SIADH (syndrome of inappropriate ADH production) ([place]) 10/10/2016  . Gastroesophageal reflux disease 08/06/2016  . PCP Notes >>>>>>>>>>>>>>>>> 09/21/2014  . Rotator cuff tear arthropathy  04/21/2014  . Abnormal MRI, shoulder 12/01/2013  .  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 Cervical radiculitis 11/10/2013  . Bursitis of right shoulder 08/19/2013  . Primary localized osteoarthrosis, lower leg 08/19/2013  . Vitamin B 12 deficiency 05/20/2013  . OSA (obstructive sleep apnea) 02/13/2012  . Pulmonary nodule, last  CT 2016, stable, no f/u 01/28/2012  . Dementia ([place]) 05/29/2011  . Fatty liver 02/27/2011  . Annual physical exam 11/26/2010  . Osteopenia 10/09/2007  . COLONIC POLYPS 08/22/2006  . DM II (diabetes mellitus, type II), controlled ([place]) 05/19/2006  . Hyperlipidemia 05/19/2006  . Depression 05/19/2006  . SYNDROME, RESTLESS LEGS 05/19/2006  . Essential hypertension 05/19/2006  . IBS -- constipation 05/19/2006    Jule Ser, PT 07/06/2019, 2:15 PM  [facility] [address] [place], [place], [address] Phone: [phone]   Fax:  [phone]  Name: [name] MRN: [id number] Date of Birth: [birth year]

## 2019-07-14 ENCOUNTER — Other Ambulatory Visit: Payer: Self-pay

## 2019-07-14 ENCOUNTER — Ambulatory Visit: Payer: Medicare Other | Attending: Urology | Admitting: Physical Therapy

## 2019-07-14 DIAGNOSIS — M6281 Muscle weakness (generalized): Secondary | ICD-10-CM | POA: Diagnosis present

## 2019-07-14 DIAGNOSIS — R296 Repeated falls: Secondary | ICD-10-CM

## 2019-07-14 DIAGNOSIS — R279 Unspecified lack of coordination: Secondary | ICD-10-CM | POA: Diagnosis present

## 2019-07-14 NOTE — Therapy (Signed)
[facility] [address], [place], [place], [address] Phone: [phone]   Fax:  [phone]  Physical Therapy Treatment Progress Note Reporting Period 05/06/19 to 07/14/19   See note below for Objective Data and Assessment of Progress/Goals.      Patient Details  Name: [name] MRN: [id number] Date of Birth: December 13, 1945 Referring Provider (PT): Kathie Rhodes, MD   Encounter Date: 07/14/2019   PT End of Session - 07/14/19 1620    Visit Number 20    Number of Visits 30    Date for PT Re-Evaluation 07/29/19    Authorization Type medicare    PT Start Time 1615    PT Stop Time 1655    PT Time Calculation (min) 40 min    Activity Tolerance Patient tolerated treatment well    Behavior During Therapy [facility] for tasks assessed/performed           Past Medical History:  Diagnosis Date  . Adenomatous colon polyp 03/1988  . Anemia    borderline  . Arthritis    R shoulder - degenerative   . Cataract    beginning stages  . Chest pain     Sept 2011: stress test neg  . Depression    sees Dr.Cotle  . Diabetes mellitus    dr Buddy Duty  . Diverticulosis   . Fatty liver    Increased LFTs, saw GI 06/2011, likely from fatty liver   . GERD (gastroesophageal reflux disease)   . History of hiatal hernia   . Hyperlipemia   . Hypertension   . IBS (irritable bowel syndrome)    and dyspepsia  . Memory impairment 08/2014   mild cognitive impairment vs. mild dementia, reommended reinstating of cholinesterase inhibitor   . Osteopenia    dexa 06/2007 and 10/11 Rx CA vitamin d  . RLS (restless legs syndrome)    h/o- no longer using Requip    Past Surgical History:  Procedure Laterality Date  . BREAST BIOPSY Left 10/11/2015   fibroadenoma w/ calcifications, no evidence of malignancy, recommend mammogram repeat-6 mnths  . POLYPECTOMY    . REVERSE SHOULDER ARTHROPLASTY Right 04/21/2014   Procedure: REVERSE SHOULDER  ARTHROPLASTY;  Surgeon: Tania Ade, MD;  Location: [place] [facility];  Service: Orthopedics;  Laterality: Right;  Right reverse total shoulder replacement  . TONSILLECTOMY    . UTERINE FIBROID EMBOLIZATION     90s    There were no vitals filed for this visit.   Subjective Assessment - 07/14/19 1652    Subjective Pt reports she is doing well and has only needed one depends per day because of no leaks.  Pt reports about 70% improvement overall.    Patient Stated Goals wear regular underwear again    Currently in Pain? No/denies                             [facility] Adult PT Treatment/Exercise - 07/14/19 0001      Lumbar Exercises: Aerobic   Nustep L3 x 5 min PT present to cue for core activation      Lumbar Exercises: Standing   Heel Raises 20 reps   on foam mat   Heel Raises Limitations standing on foam mat    Other Standing Lumbar Exercises hip flexion tap 6" and h/s curl 3lb ankle weights - 10x each side    Other Standing Lumbar Exercises tandem standing 60 sec both sides; NBOS head  turns and look up 10x each      Lumbar Exercises: Seated   Long Arc Quad on Chair Strengthening;Both;20 reps    LAQ on Chair Weights (lbs) 3    LAQ on Chair Limitations ball squeeze    Hip Flexion on Ball Strengthening;Both;10 reps    Hip Flexion on Ball Limitations 3 lb sitting on chair    Sit to Stand 10 reps   from mat sitting on foam pad   Sit to Stand Limitations 5 x sit to stand from chair no UE support 22 sec                    PT Short Term Goals - 04/27/19 1220      PT SHORT TERM GOAL #3   Title Pt will be able to perform 5 x sit to stand with no UE assistance    Status Achieved             PT Long Term Goals - 07/14/19 1636      PT LONG TERM GOAL #1   Title Pt will report at least 50% less urinary and fecal leakage     Baseline at least 60-70% improved; I am ready to switch to regular underwear    Status Achieved      PT LONG TERM GOAL #2   Title pt will  be ind with advanced HEP    Status On-going      PT LONG TERM GOAL #3   Title Pt will report needing only 1 pad/depends per day    Baseline haven't had to use any pads; using a chuck at night but haven't needed it much    Status Partially Met      PT LONG TERM GOAL #4   Title Pt will demonstrate 5x sit to stand in <13 seconds for reduced risk of falls    Baseline 22 sec no use of UE and from chair - knees start hurting Lt>Rt    Status On-going                 Plan - 07/14/19 1655    Clinical Impression Statement Pt remains the same with her 5 x sit to stand.  She appears to be limited with some knee pain but looks better with her movement being more smooth.  Pt was able to porgress strength with more resistance and added more balance exercises today.  Pt did exercise on her own this week and reports steady reduction of symptoms.  Pt will benefit from 2 more visits to ensure successful transition to HEP    Personal Factors and Comorbidities Age;Time since onset of injury/illness/exacerbation;Comorbidity 2    Comorbidities impaired memory; knee OA    PT Treatment/Interventions ADLs/Self Care Home Management;Biofeedback;Cryotherapy;Electrical Stimulation;Moist Heat;Neuromuscular re-education;Therapeutic exercise;Therapeutic activities;Taping;Dry needling;Passive range of motion;Patient/family education;Manual techniques    PT Next Visit Plan tandem and NBOS with head turns; hip hinge; hip adduction and ER; f/u with her HEP scheduled in planner    PT Home Exercise Plan Access Code: [email or web address]    Consulted and Agree with Plan of Care Patient           Patient will benefit from skilled therapeutic intervention in order to improve the following deficits and impairments:  Abnormal gait, Decreased coordination, Pain, Postural dysfunction, Decreased strength  Visit Diagnosis: Repeated falls  Muscle weakness (generalized)  Unspecified lack of coordination     Problem List Patient  Active Problem List   Diagnosis Date  Noted  . Dark stools 03/17/2017  . SIADH (syndrome of inappropriate ADH production) ([place]) 10/10/2016  . Gastroesophageal reflux disease 08/06/2016  . PCP Notes >>>>>>>>>>>>>>>>> 09/21/2014  . Rotator cuff tear arthropathy 04/21/2014  . Abnormal MRI, shoulder 12/01/2013  . 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 Cervical radiculitis 11/10/2013  . Bursitis of right shoulder 08/19/2013  . Primary localized osteoarthrosis, lower leg 08/19/2013  . Vitamin B 12 deficiency 05/20/2013  . OSA (obstructive sleep apnea) 02/13/2012  . Pulmonary nodule, last  CT 2016, stable, no f/u 01/28/2012  . Dementia ([place]) 05/29/2011  . Fatty liver 02/27/2011  . Annual physical exam 11/26/2010  . Osteopenia 10/09/2007  . COLONIC POLYPS 08/22/2006  . DM II (diabetes mellitus, type II), controlled ([place]) 05/19/2006  . Hyperlipidemia 05/19/2006  . Depression 05/19/2006  . SYNDROME, RESTLESS LEGS 05/19/2006  . Essential hypertension 05/19/2006  . IBS -- constipation 05/19/2006    Jule Ser, PT 07/14/2019, 5:19 PM  [facility] [address], [place], [place], [address] Phone: [phone]   Fax:  [phone]  Name: [name] MRN: [id number] Date of Birth: Oct 13, 1945

## 2019-07-20 ENCOUNTER — Encounter: Payer: Self-pay | Admitting: Physical Therapy

## 2019-07-20 ENCOUNTER — Ambulatory Visit (INDEPENDENT_AMBULATORY_CARE_PROVIDER_SITE_OTHER): Payer: Medicare Other | Admitting: Medical

## 2019-07-20 ENCOUNTER — Ambulatory Visit: Payer: Medicare Other | Admitting: Physical Therapy

## 2019-07-20 ENCOUNTER — Other Ambulatory Visit: Payer: Self-pay

## 2019-07-20 ENCOUNTER — Telehealth: Payer: Self-pay | Admitting: Internal Medicine

## 2019-07-20 ENCOUNTER — Telehealth: Payer: Self-pay | Admitting: Medical

## 2019-07-20 VITALS — BP 128/64 | HR 65 | Resp 18 | Ht 66.0 in | Wt 186.0 lb

## 2019-07-20 DIAGNOSIS — T148XXA Other injury of unspecified body region, initial encounter: Secondary | ICD-10-CM | POA: Diagnosis not present

## 2019-07-20 DIAGNOSIS — F0390 Unspecified dementia without behavioral disturbance: Secondary | ICD-10-CM | POA: Diagnosis not present

## 2019-07-20 DIAGNOSIS — J3489 Other specified disorders of nose and nasal sinuses: Secondary | ICD-10-CM

## 2019-07-20 DIAGNOSIS — R279 Unspecified lack of coordination: Secondary | ICD-10-CM

## 2019-07-20 DIAGNOSIS — R296 Repeated falls: Secondary | ICD-10-CM

## 2019-07-20 DIAGNOSIS — G500 Trigeminal neuralgia: Secondary | ICD-10-CM | POA: Diagnosis not present

## 2019-07-20 DIAGNOSIS — M6281 Muscle weakness (generalized): Secondary | ICD-10-CM

## 2019-07-20 LAB — CBC WITH DIFFERENTIAL/PLATELET
Basophils Absolute: 0 10*3/uL (ref 0.0–0.1)
Basophils Relative: 0.5 % (ref 0.0–3.0)
Eosinophils Absolute: 0.3 10*3/uL (ref 0.0–0.7)
Eosinophils Relative: 4.3 % (ref 0.0–5.0)
HCT: 37 % (ref 36.0–46.0)
Hemoglobin: 12.7 g/dL (ref 12.0–15.0)
Lymphocytes Relative: 31.1 % (ref 12.0–46.0)
Lymphs Abs: 1.8 10*3/uL (ref 0.7–4.0)
MCHC: 34.2 g/dL (ref 30.0–36.0)
MCV: 85.4 fl (ref 78.0–100.0)
Monocytes Absolute: 0.4 10*3/uL (ref 0.1–1.0)
Monocytes Relative: 5.9 % (ref 3.0–12.0)
Neutro Abs: 3.4 10*3/uL (ref 1.4–7.7)
Neutrophils Relative %: 58.2 % (ref 43.0–77.0)
Platelets: 150 10*3/uL (ref 150.0–400.0)
RBC: 4.33 Mil/uL (ref 3.87–5.11)
RDW: 14 % (ref 11.5–15.5)
WBC: 5.9 10*3/uL (ref 4.0–10.5)

## 2019-07-20 MED ORDER — AZITHROMYCIN 250 MG PO TABS
ORAL_TABLET | ORAL | 0 refills | Status: DC
Start: 2019-07-20 — End: 2019-07-30

## 2019-07-20 NOTE — Telephone Encounter (Signed)
Caller:[name] Call back phone [phone]  Would like to speak to you in regards to her mom care.

## 2019-07-20 NOTE — Telephone Encounter (Signed)
LMOM informing [name] to return call.

## 2019-07-20 NOTE — Patient Instructions (Signed)
Suspect pain more in line with trigeminal neuralgia. Titrate up on gabapentin to twice a day. Rx advisement on side effects. Refer to neurologist.  For faint sinus pressure, rx azithromycin. Start if sinus pressure worsens, ear pain or left side swollen lymph node.  If pain not getting better consider seeing dentist to evaluate teeth.  For bruising will get cbc to evaluate platelets.  Follow up 7 days or as needed

## 2019-07-20 NOTE — Progress Notes (Signed)
Subjective:    Patient ID: [name], female    DOB: [birth year], 74 y.o.   MRN: [id number]  HPI  Pt in with left forearm bruising present for 2-3 days. No known trauma. No nose bleeds or other bruise areas. Last cbc was normal in  08-2018.   Pt has left side facial pain which she states feel like trigeminal neuralgia. She only takes medicine gabapentin at night. Describes sharp stabbing pain in distribution of trigeminal nerve. Dx years ago and she states controlled. 7-8 level pain.   No ear pain. Pt states when chews/eats no teeth pain. Sees dentist every 6 months. No obvious nasal congestion. Does not left side maxillary sinus infections. Hx of occasional sinus infections.    Review of Systems  Constitutional: Negative for chills, fatigue and fever.  HENT: Positive for sinus pressure. Negative for congestion.   Respiratory: Negative for cough, chest tightness, wheezing and stridor.   Cardiovascular: Negative for chest pain and palpitations.  Gastrointestinal: Negative for abdominal pain.  Musculoskeletal: Negative for back pain.  Neurological: Negative for dizziness, seizures, weakness and light-headedness.       Facial pain.  Hematological: Negative for adenopathy. Does not bruise/bleed easily.  Psychiatric/Behavioral: Negative for behavioral problems and confusion.   Past Medical History:  Diagnosis Date  . Adenomatous colon polyp 03/1988  . Anemia    borderline  . Arthritis    R shoulder - degenerative   . Cataract    beginning stages  . Chest pain     Sept 2011: stress test neg  . Depression    sees Dr.Cotle  . Diabetes mellitus    dr Buddy Duty  . Diverticulosis   . Fatty liver    Increased LFTs, saw GI 06/2011, likely from fatty liver   . GERD (gastroesophageal reflux disease)   . History of hiatal hernia   . Hyperlipemia   . Hypertension   . IBS (irritable bowel syndrome)    and dyspepsia  . Memory impairment 08/2014   mild cognitive impairment vs. mild  dementia, reommended reinstating of cholinesterase inhibitor   . Osteopenia    dexa 06/2007 and 10/11 Rx CA vitamin d  . RLS (restless legs syndrome)    h/o- no longer using Requip     Social History   Socioeconomic History  . Marital status: Widowed    Spouse name: [name]  . Number of children: 3  . Years of education: Not on file  . Highest education level: Not on file  Occupational History  . Occupation: retired, was a Leisure centre manager)    Comment: RN  Tobacco Use  . Smoking status: Former Smoker    Packs/day: 2.00    Years: 10.00    Pack years: 20.00    Types: Cigarettes    Quit date: 11/26/1975    Years since quitting: 43.6  . Smokeless tobacco: Never Used  . Tobacco comment: used to smoke 2 ppd  Substance and Sexual Activity  . Alcohol use: Yes    Alcohol/week: 0.0 standard drinks    Comment: rarely  . Drug use: No  . Sexual activity: Not Currently  Other Topics Concern  . Not on file  Social History Narrative   Widow , lives by herself at [facility] in a 1 bedroom apt;  still drives (limited) , daughters [name]) live  in [place] in [place]     5 g-children   Social Determinants of Health   Financial Resource Strain:   .  Difficulty of Paying Living Expenses:   Food Insecurity:   . Worried About Charity fundraiser in the Last Year:   . Arboriculturist in the Last Year:   Transportation Needs:   . Film/video editor (Medical):   Marland Kitchen Lack of Transportation (Non-Medical):   Physical Activity:   . Days of Exercise per Week:   . Minutes of Exercise per Session:   Stress:   . Feeling of Stress :   Social Connections:   . Frequency of Communication with Friends and Family:   . Frequency of Social Gatherings with Friends and Family:   . Attends Religious Services:   . Active Member of Clubs or Organizations:   . Attends Archivist Meetings:   Marland Kitchen Marital Status:   Intimate Partner Violence:   . Fear of Current or Ex-Partner:     . Emotionally Abused:   Marland Kitchen Physically Abused:   . Sexually Abused:     Past Surgical History:  Procedure Laterality Date  . BREAST BIOPSY Left 10/11/2015   fibroadenoma w/ calcifications, no evidence of malignancy, recommend mammogram repeat-6 mnths  . POLYPECTOMY    . REVERSE SHOULDER ARTHROPLASTY Right 04/21/2014   Procedure: REVERSE SHOULDER ARTHROPLASTY;  Surgeon: Tania Ade, MD;  Location: [facility];  Service: Orthopedics;  Laterality: Right;  Right reverse total shoulder replacement  . TONSILLECTOMY    . UTERINE FIBROID EMBOLIZATION     90s    Family History  Problem Relation Age of Onset  . Lung cancer Mother        smoker  . Colon cancer Father        F dx in his 68s  . Ovarian cancer Paternal Aunt        ?  . Diabetes Other        aunts-uncles   . Stroke Other        aunts-uncles   . Heart attack Maternal Grandmother        60s  . Other Other        niece- glioblastoma  . Breast cancer Neg Hx   . Rectal cancer Neg Hx   . Stomach cancer Neg Hx   . Esophageal cancer Neg Hx     Allergies  Allergen Reactions  . Sulfa Antibiotics Nausea Only  . Trulicity [Dulaglutide] Nausea Only  . Penicillins Rash and Other (See Comments)    Has patient had a PCN reaction causing immediate rash, facial/tongue/throat swelling, SOB or lightheadedness with hypotension: No Has patient had a PCN reaction causing severe rash involving mucus membranes or skin necrosis: No Has patient had a PCN reaction that required hospitalization No Has patient had a PCN reaction occurring within the last 10 years: No If all of the above answers are "NO", then may proceed with Cephalosporin use.    Current Outpatient Medications on File Prior to Visit  Medication Sig Dispense Refill  . ACCU-CHEK FASTCLIX LANCETS MISC daily. use as directed  4  . acetaminophen (TYLENOL) 325 MG tablet Take 650 mg by mouth every 6 (six) hours as needed for mild pain or headache.    Marland Kitchen aspirin (QC LO-DOSE ASPIRIN)  81 MG EC tablet Take 1 tablet (81 mg total) by mouth daily. 90 tablet 3  . atenolol (TENORMIN) 50 MG tablet Take 25 mg by mouth daily.    Marland Kitchen atorvastatin (LIPITOR) 40 MG tablet Take 1 tablet (40 mg total) by mouth daily. 90 tablet 3  . azelastine (ASTELIN) 0.1 %  nasal spray Place 2 sprays into both nostrils at bedtime as needed for rhinitis. Use in each nostril as directed 30 mL 3  . BD VEO INSULIN SYRINGE U/F 31G X 15/64" 1 ML MISC USE TO INJECT insulin EVERY DAY  11  . Cyanocobalamin (VITAMIN B12 TR) 1000 MCG TBCR Take 1 tablet (1,000 mcg total) by mouth daily. 90 tablet 3  . darifenacin (ENABLEX) 15 MG 24 hr tablet Take 1 tablet (15 mg total) by mouth daily. 90 tablet 3  . donepezil (ARICEPT) 10 MG tablet Take 2 tablets (20 mg total) by mouth at bedtime.    . Ferrous Fumarate-Vitamin C ER (FERRO-SEQUELS) 65-25 MG TBCR Take 1 tablet by mouth daily. 90 tablet 3  . gabapentin (NEURONTIN) 100 MG capsule Take 1 capsule (100 mg total) by mouth at bedtime. 90 capsule 1  . losartan (COZAAR) 25 MG tablet Take 1 tablet (25 mg total) by mouth every evening. 90 tablet 1  . memantine (NAMENDA) 10 MG tablet Take 10 mg by mouth 2 (two) times daily.  3  . metFORMIN (GLUCOPHAGE-XR) 500 MG 24 hr tablet TAKE 1 TABLET BY MOUTH EVERY MORNING WITH A MEAL    . Multiple Vitamins-Minerals (CENTRUM SILVER 50+WOMEN) TABS Take 1 tablet by mouth daily. 90 tablet 3  . ONE TOUCH ULTRA TEST test strip USE TO CHECK BLOOD SUGAR EVERY DAY  4  . pantoprazole (PROTONIX) 40 MG tablet Take 1 tablet (40 mg total) by mouth 2 (two) times daily before a meal. 180 tablet 3  . sertraline (ZOLOFT) 100 MG tablet TAKE 1 TABLET BY MOUTH EVERY DAY AND TAKE 1/2 TABLET AT BEDTIME  2  . sodium fluoride (PREVIDENT 5000 DRY MOUTH) 1.1 % GEL dental gel Place 1 application onto teeth at bedtime as needed (for teeth).     . traMADol (ULTRAM) 50 MG tablet Take 1 tablet (50 mg total) by mouth every 12 (twelve) hours as needed.    . Insulin Detemir  (LEVEMIR FLEXTOUCH) 100 UNIT/ML Pen Inject 42 Units into the skin at bedtime.      No current facility-administered medications on file prior to visit.    BP 128/64   Pulse 65   Resp 18   Ht 5\' 6"  (1.676 m)   Wt 186 lb (84.4 kg)   SpO2 98%   BMI 30.02 kg/m   .     Objective:   Physical Exam  General Mental Status- Alert. General Appearance- Not in acute distress.   Skin General: Color- Normal Color. Moisture- Normal Moisture. 2 small bruises to left mid forearm.   Neck Carotid Arteries- Normal color. Moisture- Normal Moisture. No carotid bruits. No JVD.  Chest and Lung Exam Auscultation: Breath Sounds:-Normal.  Cardiovascular Auscultation:Rythm- Regular. Murmurs & Other Heart Sounds:Auscultation of the heart reveals- No Murmurs.  Abdomen Inspection:-Inspeection Normal. Palpation/Percussion:Note:No mass. Palpation and Percussion of the abdomen reveal- Non Tender, Non Distended + BS, no rebound or guarding.    Neurologic Cranial Nerve exam:- CN III-XII intact(No nystagmus), symmetric smile. Strength:- 5/5 equal and symmetric strength both upper and lower extremities.  Heent- left maxillary sinus faint pressure on palpation. Left side of face light tough senstive. Left submandibular area mild tender but not obvious swollen. Left side teeth not tender to palpation. Left ear- normal tm.      Assessment & Plan:  Suspect pain more in line with trigeminal neuralgia. Titrate up on gabapentin to twice a day. Rx advisement on side effects. Refer to neurologist.  For faint sinus  pressure, rx azithromycin. Start if sinus pressure worsens, ear pain or left side swollen lymph node.  If pain not getting better consider seeing dentist to evaluate teeth.  For bruising will get cbc to evaluate platelets.  Follow up 7 days or as needed  Mackie Pai, PA-C   Time spent with patient today was 30  minutes which consisted of chart review, discussing diagnoses, work up,  treatment, answering questions  and documentation.

## 2019-07-20 NOTE — Therapy (Signed)
[facility] [address] [place], [place], [address] Phone: [phone]   Fax:  [phone]  Physical Therapy Treatment  Patient Details  Name: [name] MRN: [id number] Date of Birth: Sep 10, 1945 Referring Provider (PT): Kathie Rhodes, MD   Encounter Date: 07/20/2019   PT End of Session - 07/20/19 1433    Visit Number 21    Date for PT Re-Evaluation 07/29/19    Authorization Type medicare    PT Start Time 1400    PT Stop Time 1440    PT Time Calculation (min) 40 min    Activity Tolerance Patient tolerated treatment well    Behavior During Therapy [facility] for tasks assessed/performed           Past Medical History:  Diagnosis Date  . Adenomatous colon polyp 03/1988  . Anemia    borderline  . Arthritis    R shoulder - degenerative   . Cataract    beginning stages  . Chest pain     Sept 2011: stress test neg  . Depression    sees Dr.Cotle  . Diabetes mellitus    dr Buddy Duty  . Diverticulosis   . Fatty liver    Increased LFTs, saw GI 06/2011, likely from fatty liver   . GERD (gastroesophageal reflux disease)   . History of hiatal hernia   . Hyperlipemia   . Hypertension   . IBS (irritable bowel syndrome)    and dyspepsia  . Memory impairment 08/2014   mild cognitive impairment vs. mild dementia, reommended reinstating of cholinesterase inhibitor   . Osteopenia    dexa 06/2007 and 10/11 Rx CA vitamin d  . RLS (restless legs syndrome)    h/o- no longer using Requip    Past Surgical History:  Procedure Laterality Date  . BREAST BIOPSY Left 10/11/2015   fibroadenoma w/ calcifications, no evidence of malignancy, recommend mammogram repeat-6 mnths  . POLYPECTOMY    . REVERSE SHOULDER ARTHROPLASTY Right 04/21/2014   Procedure: REVERSE SHOULDER ARTHROPLASTY;  Surgeon: Tania Ade, MD;  Location: [facility];  Service: Orthopedics;  Laterality: Right;  Right reverse total shoulder replacement  . TONSILLECTOMY     . UTERINE FIBROID EMBOLIZATION     90s    There were no vitals filed for this visit.   Subjective Assessment - 07/20/19 1448    Subjective My knees are okay today.  I have been doing the exercises most day.    Currently in Pain? No/denies                             [facility] Adult PT Treatment/Exercise - 07/20/19 0001      Lumbar Exercises: Stretches   Active Hamstring Stretch Right;Left;2 reps;20 seconds      Lumbar Exercises: Aerobic   Nustep L3 x 8 min PT present to cue for core activation      Lumbar Exercises: Standing   Heel Raises 20 reps   on foam mat   Heel Raises Limitations standing on foam mat    Other Standing Lumbar Exercises 3lb ankle weights abduction and ext - 2x 10 each side      Lumbar Exercises: Seated   Long Arc Quad on Chair Strengthening;Both;20 reps    LAQ on Chair Weights (lbs) 3    LAQ on Chair Limitations ball squeeze    Hip Flexion on Ball Strengthening;Both;10 reps    Hip Flexion on Ball Limitations 3  lb sitting on chair    Sit to Stand Limitations 5 x sit to stand from chair no UE support    Other Seated Lumbar Exercises UE flexion and scaption - 10x each with kegel    Other Seated Lumbar Exercises clam blue band - 20x                    PT Short Term Goals - 04/27/19 1220      PT SHORT TERM GOAL #3   Title Pt will be able to perform 5 x sit to stand with no UE assistance    Status Achieved             PT Long Term Goals - 07/14/19 1636      PT LONG TERM GOAL #1   Title Pt will report at least 50% less urinary and fecal leakage     Baseline at least 60-70% improved; I am ready to switch to regular underwear    Status Achieved      PT LONG TERM GOAL #2   Title pt will be ind with advanced HEP    Status On-going      PT LONG TERM GOAL #3   Title Pt will report needing only 1 pad/depends per day    Baseline haven't had to use any pads; using a chuck at night but haven't needed it much    Status  Partially Met      PT LONG TERM GOAL #4   Title Pt will demonstrate 5x sit to stand in <13 seconds for reduced risk of falls    Baseline 22 sec no use of UE and from chair - knees start hurting Lt>Rt    Status On-going                 Plan - 07/20/19 1443    Clinical Impression Statement Pt did a lot today.  She needed some seated exercises scattered between standing due to fatigue.  No increased knee pain today.  Pt reports she has been doing her exercises at least every other day so improved since previously when she was not doing them between PT sessions. Pt will benefit from skilled PT for another session in order to ensure she has reached goals and to have successful transition home with HEP.  It is not likely she will meet 5x sit to stand as shehas remained the same for several visitis.    PT Treatment/Interventions ADLs/Self Care Home Management;Biofeedback;Cryotherapy;Electrical Stimulation;Moist Heat;Neuromuscular re-education;Therapeutic exercise;Therapeutic activities;Taping;Dry needling;Passive range of motion;Patient/family education;Manual techniques    PT Next Visit Plan 5x sit to stand; tandem and NBOS with head turns; hip hinge; hip adduction and ER;    PT Home Exercise Plan Access Code: [email or web address]    Consulted and Agree with Plan of Care Patient           Patient will benefit from skilled therapeutic intervention in order to improve the following deficits and impairments:  Abnormal gait, Decreased coordination, Pain, Postural dysfunction, Decreased strength  Visit Diagnosis: Repeated falls  Muscle weakness (generalized)  Unspecified lack of coordination     Problem List Patient Active Problem List   Diagnosis Date Noted  . Dark stools 03/17/2017  . SIADH (syndrome of inappropriate ADH production) ([place]) 10/10/2016  . Gastroesophageal reflux disease 08/06/2016  . PCP Notes >>>>>>>>>>>>>>>>> 09/21/2014  . Rotator cuff tear arthropathy 04/21/2014  . Abnormal  MRI, shoulder 12/01/2013  . 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 Cervical radiculitis  11/10/2013  . Bursitis of right shoulder 08/19/2013  . Primary localized osteoarthrosis, lower leg 08/19/2013  . Vitamin B 12 deficiency 05/20/2013  . OSA (obstructive sleep apnea) 02/13/2012  . Pulmonary nodule, last  CT 2016, stable, no f/u 01/28/2012  . Dementia ([place]) 05/29/2011  . Fatty liver 02/27/2011  . Annual physical exam 11/26/2010  . Osteopenia 10/09/2007  . COLONIC POLYPS 08/22/2006  . DM II (diabetes mellitus, type II), controlled ([place]) 05/19/2006  . Hyperlipidemia 05/19/2006  . Depression 05/19/2006  . SYNDROME, RESTLESS LEGS 05/19/2006  . Essential hypertension 05/19/2006  . IBS -- constipation 05/19/2006    Jule Ser, PT 07/20/2019, 2:49 PM  [facility] [address] [place], [place], [address] Phone: [phone]   Fax:  [phone]  Name: LAILIE SMEAD MRN: [id number] Date of Birth: 01-Aug-1945

## 2019-07-20 NOTE — Telephone Encounter (Signed)
Future cbc order placed.

## 2019-07-21 ENCOUNTER — Ambulatory Visit (AMBULATORY_SURGERY_CENTER): Payer: Self-pay | Admitting: *Deleted

## 2019-07-21 VITALS — Ht 66.0 in | Wt 188.0 lb

## 2019-07-21 DIAGNOSIS — Z86010 Personal history of colonic polyps: Secondary | ICD-10-CM

## 2019-07-21 DIAGNOSIS — Z800 Family history of malignant neoplasm of digestive organs: Secondary | ICD-10-CM

## 2019-07-21 MED ORDER — SUTAB 1479-225-188 MG PO TABS: 1.0000 | ORAL_TABLET | ORAL | 0 refills | Status: DC

## 2019-07-21 NOTE — Progress Notes (Signed)
Patient and daughter is here in-person for PV. Patient denies any allergies to eggs or soy. Patient denies any problems with anesthesia/sedation. Patient denies any oxygen use at home. Patient denies taking any diet/weight loss medications or blood thinners. Patient is not being treated for MRSA or C-diff. Patient is aware of our care-partner policy and [id number] safety protocol. EMMI education assisgned to the patient for the procedure, this was explained and instructions given to patient.   COVID-19 vaccines completed. Prep Prescription coupon given to the patient.

## 2019-07-22 ENCOUNTER — Encounter: Payer: Self-pay | Admitting: Gastroenterology

## 2019-07-27 ENCOUNTER — Ambulatory Visit: Payer: Medicare Other | Admitting: Physical Therapy

## 2019-07-30 ENCOUNTER — Other Ambulatory Visit: Payer: Self-pay

## 2019-07-30 ENCOUNTER — Ambulatory Visit (INDEPENDENT_AMBULATORY_CARE_PROVIDER_SITE_OTHER): Payer: Medicare Other | Admitting: Internal Medicine

## 2019-07-30 ENCOUNTER — Encounter: Payer: Self-pay | Admitting: Internal Medicine

## 2019-07-30 VITALS — BP 147/84 | HR 53 | Temp 98.1°F | Resp 18 | Ht 66.0 in | Wt 185.1 lb

## 2019-07-30 DIAGNOSIS — F329 Major depressive disorder, single episode, unspecified: Secondary | ICD-10-CM | POA: Diagnosis not present

## 2019-07-30 DIAGNOSIS — G3184 Mild cognitive impairment, so stated: Secondary | ICD-10-CM | POA: Diagnosis not present

## 2019-07-30 DIAGNOSIS — M159 Polyosteoarthritis, unspecified: Secondary | ICD-10-CM

## 2019-07-30 DIAGNOSIS — I10 Essential (primary) hypertension: Secondary | ICD-10-CM | POA: Diagnosis not present

## 2019-07-30 DIAGNOSIS — M8949 Other hypertrophic osteoarthropathy, multiple sites: Secondary | ICD-10-CM

## 2019-07-30 DIAGNOSIS — F32A Depression, unspecified: Secondary | ICD-10-CM

## 2019-07-30 MED ORDER — TRAMADOL HCL 50 MG PO TABS: 50.0000 mg | ORAL_TABLET | Freq: Two times a day (BID) | ORAL | 0 refills | Status: DC | PRN

## 2019-07-30 NOTE — Progress Notes (Signed)
Pre visit review using our clinic review tool, if applicable. No additional management support is needed unless otherwise documented below in the visit note. 

## 2019-07-30 NOTE — Patient Instructions (Signed)
Continue taking the same medications  GO TO THE FRONT DESK, PLEASE SCHEDULE YOUR APPOINTMENTS Come back for a checkup in 4 months

## 2019-07-30 NOTE — Progress Notes (Signed)
Subjective:    Patient ID: [name], female    DOB: [birth year], 74 y.o.   MRN: [id number]  DOS:  07/30/2019 Type of visit - description: Follow-up Since the last office visit, she has been doing physical therapy for decreased balance, it helped, wonders if she could do more. Also was seen by another provider in this office with possibly trigeminal neuralgia, gabapentin helped. Request a refill on tramadol, very rarely takes it.  Review of Systems See above   Past Medical History:  Diagnosis Date  . Adenomatous colon polyp 03/1988  . Anemia    borderline  . Arthritis    R shoulder - degenerative   . Cataract    beginning stages  . Chest pain     Sept 2011: stress test neg  . Depression    sees Dr.Cotle  . Diabetes mellitus    dr Buddy Duty  . Diverticulosis   . Fatty liver    Increased LFTs, saw GI 06/2011, likely from fatty liver   . GERD (gastroesophageal reflux disease)   . History of hiatal hernia   . Hyperlipemia   . Hypertension   . IBS (irritable bowel syndrome)    and dyspepsia  . Memory impairment 08/2014   mild cognitive impairment vs. mild dementia, reommended reinstating of cholinesterase inhibitor   . Osteopenia    dexa 06/2007 and 10/11 Rx CA vitamin d  . RLS (restless legs syndrome)    h/o- no longer using Requip  . Sleep apnea    Mouth guard use at home  . Thyroid nodule     Past Surgical History:  Procedure Laterality Date  . BREAST BIOPSY Left 10/11/2015   fibroadenoma w/ calcifications, no evidence of malignancy, recommend mammogram repeat-6 mnths  . COLONOSCOPY  04/15/2014  . POLYPECTOMY    . REVERSE SHOULDER ARTHROPLASTY Right 04/21/2014   Procedure: REVERSE SHOULDER ARTHROPLASTY;  Surgeon: Tania Ade, MD;  Location: [place];  Service: Orthopedics;  Laterality: Right;  Right reverse total shoulder replacement  . TONSILLECTOMY    . UTERINE FIBROID EMBOLIZATION     90s    Allergies as of 07/30/2019      Reactions   Sulfa Antibiotics  Nausea Only   Trulicity [dulaglutide] Nausea Only   Penicillins Rash, Other (See Comments)   Has patient had a PCN reaction causing immediate rash, facial/tongue/throat swelling, SOB or lightheadedness with hypotension: No Has patient had a PCN reaction causing severe rash involving mucus membranes or skin necrosis: No Has patient had a PCN reaction that required hospitalization No Has patient had a PCN reaction occurring within the last 10 years: No If all of the above answers are "NO", then may proceed with Cephalosporin use.      Medication List       Accurate as of July 30, 2019 11:59 PM. If you have any questions, ask your nurse or doctor.        STOP taking these medications   azithromycin 250 MG tablet Commonly known as: ZITHROMAX Stopped by: Kathlene November, MD     TAKE these medications   Accu-Chek FastClix Lancets Misc daily. use as directed   acetaminophen 325 MG tablet Commonly known as: TYLENOL Take 650 mg by mouth every 6 (six) hours as needed for mild pain or headache.   Aricept 10 MG tablet Generic drug: donepezil Take 2 tablets (20 mg total) by mouth at bedtime.   aspirin 81 MG EC tablet Commonly known as: QC LO-DOSE ASPIRIN Take 1 tablet (  81 mg total) by mouth daily.   atenolol 50 MG tablet Commonly known as: TENORMIN Take 25 mg by mouth daily.   atorvastatin 40 MG tablet Commonly known as: LIPITOR Take 1 tablet (40 mg total) by mouth daily.   azelastine 0.1 % nasal spray Commonly known as: ASTELIN Place 2 sprays into both nostrils at bedtime as needed for rhinitis. Use in each nostril as directed   BD Veo Insulin Syringe U/F 31G X 15/64" 1 ML Misc Generic drug: Insulin Syringe-Needle U-100 USE TO INJECT insulin EVERY DAY   Centrum Silver 50+Women Tabs Take 1 tablet by mouth daily.   darifenacin 15 MG 24 hr tablet Commonly known as: ENABLEX Take 1 tablet (15 mg total) by mouth daily.   Ferro-Sequels 65-25 MG Tbcr Generic drug: Ferrous  Fumarate-Vitamin C ER Take 1 tablet by mouth daily.   gabapentin 100 MG capsule Commonly known as: NEURONTIN Take 1 capsule (100 mg total) by mouth at bedtime.   Levemir 100 UNIT/ML injection Generic drug: insulin detemir Inject into the skin.   losartan 25 MG tablet Commonly known as: COZAAR Take 1 tablet (25 mg total) by mouth every evening.   memantine 10 MG tablet Commonly known as: NAMENDA Take 10 mg by mouth 2 (two) times daily.   metFORMIN 500 MG 24 hr tablet Commonly known as: GLUCOPHAGE-XR TAKE 1 TABLET BY MOUTH EVERY MORNING WITH A MEAL   ONE TOUCH ULTRA TEST test strip Generic drug: glucose blood USE TO CHECK BLOOD SUGAR EVERY DAY   pantoprazole 40 MG tablet Commonly known as: PROTONIX Take 1 tablet (40 mg total) by mouth 2 (two) times daily before a meal.   PreviDent 5000 Dry Mouth 1.1 % Gel dental gel Generic drug: sodium fluoride Place 1 application onto teeth at bedtime as needed (for teeth).   sertraline 100 MG tablet Commonly known as: ZOLOFT TAKE 1 TABLET BY MOUTH EVERY DAY AND TAKE 1/2 TABLET AT BEDTIME   Sutab 210 020 8378 MG Tabs Generic drug: Sodium Sulfate-Mag Sulfate-KCl Take 1 kit by mouth as directed.   traMADol 50 MG tablet Commonly known as: Ultram Take 1 tablet (50 mg total) by mouth every 12 (twelve) hours as needed.   Vitamin B12 TR 1000 MCG Tbcr Generic drug: Cyanocobalamin Take 1 tablet (1,000 mcg total) by mouth daily.          Objective:   Physical Exam BP (!) 147/84 (BP Location: Left Arm, Patient Position: Sitting, Cuff Size: Normal)   Pulse 53   Temp 98.1 F (36.7 C) (Oral)   Resp 18   Ht '5\' 6"'  (1.676 m)   Wt 185 lb 2 oz (84 kg)   SpO2 97%   BMI 29.88 kg/m  General:   Well developed, NAD, BMI noted. HEENT:  Normocephalic . Face symmetric, atraumatic Lungs:  CTA B Normal respiratory effort, no intercostal retractions, no accessory muscle use. Heart: RRR, trace systolic murmur.  Lower extremities: no  pretibial edema bilaterally  Skin: Not pale. Not jaundice Neurologic:  alert & oriented X to self, space.  Partially oriented on time, did not recall the date. Speech normal, gait appropriate for age and unassisted.  Needs help transferring Psych--  Cognition and judgment appear intact.  Cooperative with normal attention span and concentration.  Behavior appropriate. No anxious or depressed appearing.      Assessment    ASSESSMENT DM Dr. Buddy Duty HTN Hyperlipidemia H/o SIADH Depression seen elsewhere Mild cognitive impairment  MMSE 2015 --> 28, on Aricept, namenda added 04-2016  by Dr Everette Rank  OSA -- mild, also told RLS (Rx requip); saw Dr Gwenette Greet 2014, no CPAP GI: --IBS, colon polyps, diverticulosis, hiatal hernia --Chronic constipation likely part of her IBS syndrome  --Fatty liver >>> GI eval 2013 --iron fec anemia:  cscope 04-2014 : 2 polyps; + hemocult @ GI office 10-2014: EGD done (-), bx neg Osteopenia: T score -1.1  2009 , osteopenia again per dexa 05-2013, t score -1.8 (10-09-17), scanned, rx ca-vit d B12 deficiency  RLS MSK: --DJD frozen shoulder Dizziness: chronic, carotid US 2016 neg, saw cards-- not likely CV related; saw neuro DR Everette Rank 02-2015  Chest pain 09-2009, negative stress test Thyroid nodules: Incidental   by Carotid US, BX 11-2014: Atypical findings, [facility] III, f/u  Endocrine RLL Lung nodule: Stable per CT 09-2016, no need for further B/B incontinence:  uses diapers   PLAN: DM, thyroid disease: Per Endo HTN: No recent ambulatory BPs, BP today is satisfactory, continue atenolol, losartan. Trace systolic murmur?  New finding today, reassess on RTC Depression: Seems to be doing very well.  On Zoloft. MCI: Continue living independently, driving, managing her own medicines.  On Aricept and Namenda. B12 deficiency: Last labs satisfactory DJD: Request refill for tramadol, hardly ever uses it, I agreed to prescribe it, watch for drowsiness.   Gait disorder: Has done  PT to help with balance, wonders if she could do more.  We will try, referral sent. RTC 4 months  This visit occurred during the SARS-CoV-2 public health emergency.  Safety protocols were in place, including screening questions prior to the visit, additional usage of staff PPE, and extensive cleaning of exam room while observing appropriate contact time as indicated for disinfecting solutions.

## 2019-08-01 NOTE — Assessment & Plan Note (Signed)
DM, thyroid disease: Per Endo HTN: No recent ambulatory BPs, BP today is satisfactory, continue atenolol, losartan. Trace systolic murmur?  New finding today, reassess on RTC Depression: Seems to be doing very well.  On Zoloft. MCI: Continue living independently, driving, managing her own medicines.  On Aricept and Namenda. B12 deficiency: Last labs satisfactory DJD: Request refill for tramadol, hardly ever uses it, I agreed to prescribe it, watch for drowsiness.   Gait disorder: Has done PT to help with balance, wonders if she could do more.  We will try, referral sent. RTC 4 months

## 2019-08-03 ENCOUNTER — Other Ambulatory Visit: Payer: Self-pay

## 2019-08-03 ENCOUNTER — Ambulatory Visit: Payer: Medicare Other | Admitting: Physical Therapy

## 2019-08-03 DIAGNOSIS — R296 Repeated falls: Secondary | ICD-10-CM | POA: Diagnosis not present

## 2019-08-03 DIAGNOSIS — R279 Unspecified lack of coordination: Secondary | ICD-10-CM

## 2019-08-03 DIAGNOSIS — M6281 Muscle weakness (generalized): Secondary | ICD-10-CM

## 2019-08-03 NOTE — Therapy (Signed)
[facility] [address], [place], [place], [address] Phone: [phone]   Fax:  [phone]  Physical Therapy Treatment  Patient Details  Name: [name] MRN: [id number] Date of Birth: 11-Jul-1945 Referring Provider (PT): Kathie Rhodes, MD   Encounter Date: 08/03/2019   PT End of Session - 08/03/19 1414    Visit Number 22    Date for PT Re-Evaluation 08/03/19    Authorization Type medicare    PT Start Time 1404    PT Stop Time 1444    PT Time Calculation (min) 40 min    Activity Tolerance Patient tolerated treatment well    Behavior During Therapy [facility] for tasks assessed/performed           Past Medical History:  Diagnosis Date  . Adenomatous colon polyp 03/1988  . Anemia    borderline  . Arthritis    R shoulder - degenerative   . Cataract    beginning stages  . Chest pain     Sept 2011: stress test neg  . Depression    sees Dr.Cotle  . Diabetes mellitus    dr Buddy Duty  . Diverticulosis   . Fatty liver    Increased LFTs, saw GI 06/2011, likely from fatty liver   . GERD (gastroesophageal reflux disease)   . History of hiatal hernia   . Hyperlipemia   . Hypertension   . IBS (irritable bowel syndrome)    and dyspepsia  . Memory impairment 08/2014   mild cognitive impairment vs. mild dementia, reommended reinstating of cholinesterase inhibitor   . Osteopenia    dexa 06/2007 and 10/11 Rx CA vitamin d  . RLS (restless legs syndrome)    h/o- no longer using Requip  . Sleep apnea    Mouth guard use at home  . Thyroid nodule     Past Surgical History:  Procedure Laterality Date  . BREAST BIOPSY Left 10/11/2015   fibroadenoma w/ calcifications, no evidence of malignancy, recommend mammogram repeat-6 mnths  . COLONOSCOPY  04/15/2014  . POLYPECTOMY    . REVERSE SHOULDER ARTHROPLASTY Right 04/21/2014   Procedure: REVERSE SHOULDER ARTHROPLASTY;  Surgeon: Tania Ade, MD;  Location: [place];  Service:  Orthopedics;  Laterality: Right;  Right reverse total shoulder replacement  . TONSILLECTOMY    . UTERINE FIBROID EMBOLIZATION     90s    There were no vitals filed for this visit.       [facility] PT Assessment - 08/03/19 0001      Assessment   Medical Diagnosis N39.41 (ICD-10-CM) - Urge incontinence    Referring Provider (PT) Kathie Rhodes, MD      Transfers   Five time sit to stand comments  16 sec without UE support                         [facility] Adult PT Treatment/Exercise - 08/03/19 0001      Lumbar Exercises: Stretches   Active Hamstring Stretch Right;Left;2 reps;20 seconds      Lumbar Exercises: Aerobic   Nustep L3 x 6 min PT present to cue for core activation      Lumbar Exercises: Standing   Other Standing Lumbar Exercises post pelvic tilt against the wall - UE flex and bicep curl - 10x each      Lumbar Exercises: Seated   Long Arc Quad on Chair Strengthening;Both;20 reps    LAQ on Chair Weights (lbs) 3  LAQ on Chair Limitations ball squeeze    Sit to Stand Limitations 5 x sit to stand from chair no UE support   16 sec   Other Seated Lumbar Exercises clam blue band - 20x      Lumbar Exercises: Sidelying   Hip Abduction Limitations abduction 10x each side; adduction 10x each side   add to HEP                   PT Short Term Goals - 04/27/19 1220      PT SHORT TERM GOAL #3   Title Pt will be able to perform 5 x sit to stand with no UE assistance    Status Achieved             PT Long Term Goals - 08/03/19 1416      PT LONG TERM GOAL #1   Title Pt will report at least 50% less urinary and fecal leakage     Baseline at least 70% improved; I am ready to switch to regular underwear    Status Achieved      PT LONG TERM GOAL #2   Title pt will be ind with advanced HEP    Status Achieved      PT LONG TERM GOAL #3   Title Pt will report needing only 1 pad/depends per day    Baseline 1/day is how much I am currently using     Status Achieved      PT LONG TERM GOAL #4   Title Pt will demonstrate 5x sit to stand in <13 seconds for reduced risk of falls    Baseline 16 sec down 30 sec    Status Partially Met                 Plan - 08/03/19 1455    Clinical Impression Statement Pt arrived today after 2 weeks of working on HEP on her own.  She looks much better standing from chair in waiting room and was able to demosntrate progress with 5x sit to stand 16 sec down from 30 sec.  Pt is currently ind with HEP and HEP was updated to reflect current strength and impairments that need continued work on her own . She is recommended to discharge with HEP today.    PT Frequency One time visit    PT Treatment/Interventions ADLs/Self Care Home Management;Biofeedback;Cryotherapy;Electrical Stimulation;Moist Heat;Neuromuscular re-education;Therapeutic exercise;Therapeutic activities;Taping;Dry needling;Passive range of motion;Patient/family education;Manual techniques    PT Next Visit Plan d/c today    PT Home Exercise Plan Access Code: [email or web address]    Consulted and Agree with Plan of Care Patient           Patient will benefit from skilled therapeutic intervention in order to improve the following deficits and impairments:  Abnormal gait, Decreased coordination, Pain, Postural dysfunction, Decreased strength  Visit Diagnosis: Repeated falls  Muscle weakness (generalized)  Unspecified lack of coordination     Problem List Patient Active Problem List   Diagnosis Date Noted  . Dark stools 03/17/2017  . SIADH (syndrome of inappropriate ADH production) ([place]) 10/10/2016  . Gastroesophageal reflux disease 08/06/2016  . PCP Notes >>>>>>>>>>>>>>>>> 09/21/2014  . Rotator cuff tear arthropathy 04/21/2014  . Abnormal MRI, shoulder 12/01/2013  . Cervical radiculitis 11/10/2013  . DJD (degenerative joint disease) 08/19/2013  . Vitamin B 12 deficiency 05/20/2013  . OSA (obstructive sleep apnea) 02/13/2012  . Pulmonary  nodule, last  CT 2016, stable, no f/u 01/28/2012  . MCI (  mild cognitive impairment) 05/29/2011  . Fatty liver 02/27/2011  . Annual physical exam 11/26/2010  . Osteopenia 10/09/2007  . COLONIC POLYPS 08/22/2006  . DM II (diabetes mellitus, type II), controlled ([place]) 05/19/2006  . Hyperlipidemia 05/19/2006  . Depression 05/19/2006  . SYNDROME, RESTLESS LEGS 05/19/2006  . Hypertension 05/19/2006  . IBS -- constipation 05/19/2006    Jule Ser, PT 08/03/2019, 3:11 PM  [facility] [address] [place], [place], [address] Phone: [phone]   Fax:  [phone]  Name: [name] MRN: [id number] Date of Birth: [birth year]  PHYSICAL THERAPY DISCHARGE SUMMARY  Visits from Start of Care: 22  Current functional level related to goals / functional outcomes: See above details   Remaining deficits: See above details   Education / Equipment: HEP Plan: Patient agrees to discharge.  Patient goals were met. Patient is being discharged due to being pleased with the current functional level.  ?????    Most goals met  Gustavus Bryant, PT 08/03/19 3:14 PM

## 2019-08-04 ENCOUNTER — Ambulatory Visit (AMBULATORY_SURGERY_CENTER): Payer: Medicare Other | Admitting: Gastroenterology

## 2019-08-04 ENCOUNTER — Encounter: Payer: Self-pay | Admitting: Gastroenterology

## 2019-08-04 VITALS — BP 129/62 | HR 45 | Temp 96.2°F | Resp 12 | Ht 66.0 in | Wt 188.0 lb

## 2019-08-04 DIAGNOSIS — D124 Benign neoplasm of descending colon: Secondary | ICD-10-CM

## 2019-08-04 DIAGNOSIS — D122 Benign neoplasm of ascending colon: Secondary | ICD-10-CM

## 2019-08-04 DIAGNOSIS — D125 Benign neoplasm of sigmoid colon: Secondary | ICD-10-CM

## 2019-08-04 DIAGNOSIS — D123 Benign neoplasm of transverse colon: Secondary | ICD-10-CM | POA: Diagnosis not present

## 2019-08-04 DIAGNOSIS — Z86010 Personal history of colonic polyps: Secondary | ICD-10-CM

## 2019-08-04 MED ORDER — SODIUM CHLORIDE 0.9 % IV SOLN: 500.0000 mL | Freq: Once | INTRAVENOUS | Status: DC

## 2019-08-04 NOTE — Progress Notes (Signed)
Called to room to assist during endoscopic procedure.  Patient ID and intended procedure confirmed with present staff. Received instructions for my participation in the procedure from the performing physician.  

## 2019-08-04 NOTE — Progress Notes (Signed)
Patient had pre visit state no change in history

## 2019-08-04 NOTE — Op Note (Signed)
[facility] Patient Name: [name] Procedure Date: 08/04/2019 9:27 AM MRN: [id number] Endoscopist: Ladene Artist , MD Age: 74 Referring MD:  Date of Birth: Aug 29, 1945 Gender: Female Account #: [id number] Procedure:                Colonoscopy Indications:              Surveillance: Personal history of adenomatous                            polyps on last colonoscopy 5 years ago Medicines:                Monitored Anesthesia Care Procedure:                Pre-Anesthesia Assessment:                           - Prior to the procedure, a History and Physical                            was performed, and patient medications and                            allergies were reviewed. The patient's tolerance of                            previous anesthesia was also reviewed. The risks                            and benefits of the procedure and the sedation                            options and risks were discussed with the patient.                            All questions were answered, and informed consent                            was obtained. Prior Anticoagulants: The patient has                            taken no previous anticoagulant or antiplatelet                            agents. ASA Grade Assessment: II - A patient with                            mild systemic disease. After reviewing the risks                            and benefits, the patient was deemed in                            satisfactory condition to undergo the procedure.  After obtaining informed consent, the colonoscope                            was passed under direct vision. Throughout the                            procedure, the patient's blood pressure, pulse, and                            oxygen saturations were monitored continuously. The                            Colonoscope was introduced through the anus and                            advanced to the the cecum,  identified by                            appendiceal orifice and ileocecal valve. The                            ileocecal valve, appendiceal orifice, and rectum                            were photographed. The quality of the bowel                            preparation was good. The colonoscopy was performed                            without difficulty. The patient tolerated the                            procedure well. Scope In: 9:49:03 AM Scope Out: 10:12:18 AM Scope Withdrawal Time: 0 hours 17 minutes 19 seconds  Total Procedure Duration: 0 hours 23 minutes 15 seconds  Findings:                 The perianal and digital rectal examinations were                            normal.                           Nine sessile polyps were found in the sigmoid                            colon, descending colon, transverse colon and                            ascending colon. The polyps were 6 to 8 mm in size.                            These polyps were removed with a cold snare.  Resection and retrieval were complete.                           Internal hemorrhoids were found during                            retroflexion. The hemorrhoids were small and Grade                            I (internal hemorrhoids that do not prolapse).                           The exam was otherwise without abnormality on                            direct and retroflexion views. Complications:            No immediate complications. Estimated blood loss:                            None. Estimated Blood Loss:     Estimated blood loss: none. Impression:               - Nine 6 to 8 mm polyps in the sigmoid colon, in                            the descending colon, in the transverse colon and                            in the ascending colon, removed with a cold snare.                            Resected and retrieved.                           - Internal hemorrhoids.                            - The examination was otherwise normal on direct                            and retroflexion views. Recommendation:           - Repeat colonoscopy after studies are complete,                            likely 2-3 years, for surveillance based on                            pathology results.                           - Patient has a contact number available for                            emergencies. The signs and symptoms of potential  delayed complications were discussed with the                            patient. Return to normal activities tomorrow.                            Written discharge instructions were provided to the                            patient.                           - Resume previous diet.                           - Continue present medications.                           - Await pathology results. Ladene Artist, MD 08/04/2019 10:15:58 AM This report has been signed electronically.

## 2019-08-04 NOTE — Patient Instructions (Signed)
Handouts given:  Polyps, Hemorrhoids Resume previous diet Continue present medications  await pathology results   YOU HAD AN ENDOSCOPIC PROCEDURE TODAY AT [facility]:   Refer to the procedure report that was given to you for any specific questions about what was found during the examination.  If the procedure report does not answer your questions, please call your gastroenterologist to clarify.  If you requested that your care partner not be given the details of your procedure findings, then the procedure report has been included in a sealed envelope for you to review at your convenience later.  YOU SHOULD EXPECT: Some feelings of bloating in the abdomen. Passage of more gas than usual.  Walking can help get rid of the air that was put into your GI tract during the procedure and reduce the bloating. If you had a lower endoscopy (such as a colonoscopy or flexible sigmoidoscopy) you may notice spotting of blood in your stool or on the toilet paper. If you underwent a bowel prep for your procedure, you may not have a normal bowel movement for a few days.  Please Note:  You might notice some irritation and congestion in your nose or some drainage.  This is from the oxygen used during your procedure.  There is no need for concern and it should clear up in a day or so.  SYMPTOMS TO REPORT IMMEDIATELY:   Following lower endoscopy (colonoscopy or flexible sigmoidoscopy):  Excessive amounts of blood in the stool  Significant tenderness or worsening of abdominal pains  Swelling of the abdomen that is new, acute  Fever of 100F or higher   For urgent or emergent issues, a gastroenterologist can be reached at any hour by calling [phone]. Do not use MyChart messaging for urgent concerns.    DIET:  We do recommend a small meal at first, but then you may proceed to your regular diet.  Drink plenty of fluids but you should avoid alcoholic beverages for 24 hours.  ACTIVITY:  You  should plan to take it easy for the rest of today and you should NOT DRIVE or use heavy machinery until tomorrow (because of the sedation medicines used during the test).    FOLLOW UP: Our staff will call the number listed on your records 48-72 hours following your procedure to check on you and address any questions or concerns that you may have regarding the information given to you following your procedure. If we do not reach you, we will leave a message.  We will attempt to reach you two times.  During this call, we will ask if you have developed any symptoms of COVID 19. If you develop any symptoms (ie: fever, flu-like symptoms, shortness of breath, cough etc.) before then, please call [phone].  If you test positive for Covid 19 in the 2 weeks post procedure, please call and report this information to [place].    If any biopsies were taken you will be contacted by phone or by letter within the next 1-3 weeks.  Please call us at [phone] if you have not heard about the biopsies in 3 weeks.    SIGNATURES/CONFIDENTIALITY: You and/or your care partner have signed paperwork which will be entered into your electronic medical record.  These signatures attest to the fact that that the information above on your After Visit Summary has been reviewed and is understood.  Full responsibility of the confidentiality of this discharge information lies with you and/or your care-partner.

## 2019-08-04 NOTE — Progress Notes (Signed)
A/ox3, pleased with MAC, report to RN 

## 2019-08-06 ENCOUNTER — Telehealth: Payer: Self-pay

## 2019-08-06 NOTE — Telephone Encounter (Signed)
Attempted to reach patient for post-procedure f/u call. No answer, unable to leave voicemail as mailbox is full.

## 2019-08-06 NOTE — Telephone Encounter (Signed)
Patient returning the call is doing well no questions at this time

## 2019-08-06 NOTE — Telephone Encounter (Signed)
Called [phone] and her mailbox was full, was unable to leave a message we tried to reach pt for a follow up call. maw

## 2019-08-15 ENCOUNTER — Encounter: Payer: Self-pay | Admitting: Gastroenterology

## 2019-08-18 ENCOUNTER — Other Ambulatory Visit: Payer: Self-pay | Admitting: Internal Medicine

## 2019-08-26 ENCOUNTER — Telehealth: Payer: Self-pay | Admitting: Internal Medicine

## 2019-08-26 NOTE — Telephone Encounter (Signed)
Spoke w/ [name], Pt's daughter- informed of recommendations. [name] verbalized understanding.

## 2019-08-26 NOTE — Telephone Encounter (Signed)
Please advise 

## 2019-08-26 NOTE — Telephone Encounter (Signed)
Caller: Brooklynne Call back # [phone]   Patient [place] the beach she has blister on toe, applying neosporin. Patient states it looks good but she was wondering if she needs to be in some antibiotics for preventing infection.

## 2019-08-26 NOTE — Telephone Encounter (Signed)
Recommend to keep the area clean and dry.  Okay to use topical antibiotics. Do not try to open the blister. If the area gets red, swollen, tender: Recommend go to a local urgent care.

## 2019-09-10 ENCOUNTER — Other Ambulatory Visit: Payer: Self-pay

## 2019-09-10 ENCOUNTER — Ambulatory Visit (INDEPENDENT_AMBULATORY_CARE_PROVIDER_SITE_OTHER): Payer: Medicare Other | Admitting: Family Medicine

## 2019-09-10 ENCOUNTER — Encounter: Payer: Self-pay | Admitting: Family Medicine

## 2019-09-10 ENCOUNTER — Telehealth: Payer: Self-pay | Admitting: Internal Medicine

## 2019-09-10 VITALS — BP 140/84 | HR 66 | Temp 98.3°F | Ht 66.0 in | Wt 191.4 lb

## 2019-09-10 DIAGNOSIS — M25512 Pain in left shoulder: Secondary | ICD-10-CM

## 2019-09-10 DIAGNOSIS — R0789 Other chest pain: Secondary | ICD-10-CM | POA: Diagnosis not present

## 2019-09-10 MED ORDER — TRAMADOL HCL 50 MG PO TABS: 50.0000 mg | ORAL_TABLET | Freq: Two times a day (BID) | ORAL | 0 refills | Status: DC | PRN

## 2019-09-10 NOTE — Telephone Encounter (Signed)
Patient states she thinks she forgot to mention when she was here earlier that her provider Dr. Andris Baumann at [facility] wanted her to have her pcp call them or send a referral over to get a faster appointment.

## 2019-09-10 NOTE — Patient Instructions (Addendum)
Ice/cold pack over area for 10-15 min twice daily.  Heat (pad or rice pillow in microwave) over affected area, 10-15 minutes twice daily.   This feels more like the muscles/bones of your chest. Send me a message in 10-14 days if no better or if something new arises.   OK to take Tylenol 1000 mg (2 extra strength tabs) or 975 mg (3 regular strength tabs) every 6 hours as needed.   Pectoralis Major Rehab Ask your health care provider which exercises are safe for you. Do exercises exactly as told by your health care provider and adjust them as directed. It is normal to feel mild stretching, pulling, tightness, or discomfort as you do these exercises, but you should stop right away if you feel sudden pain or your pain gets worse.Do not begin these exercises until told by your health care provider. Stretching and range of motion exercises These exercises warm up your muscles and joints and improve the movement and flexibility of your shoulder. These exercises can also help to relieve pain, numbness, and tingling. Exercise A: Pendulum  1. Stand near a wall or a surface that you can hold onto for balance. 2. Bend at the waist and let your left / right arm hang straight down. Use your other arm to keep your balance. 3. Relax your arm and shoulder muscles, and move your hips and your trunk so your left / right arm swings freely. Your arm should swing because of the motion of your body, not because you are using your arm or shoulder muscles. 4. Keep moving so your arm swings in the following directions, as told by your health care provider: ? Side to side. ? Forward and backward. ? In clockwise and counterclockwise circles. 5. Slowly return to the starting position. Repeat 2 times. Complete this exercise 3 times per week. Exercise B: Abduction, standing 1. Stand and hold a broomstick, a cane, or a similar object. Place your hands a little more than shoulder-width apart on the object. Your left /  right hand should be palm-up, and your other hand should be palm-down. 2. While keeping your elbow straight and your shoulder muscles relaxed, push the stick across your body toward your left / right side. Raise your left / right arm to the side of your body and then over your head until you feel a stretch in your shoulder. ? Stop when you reach the angle that is recommended by your health care provider. ? Avoid shrugging your shoulder while you raise your arm. Keep your shoulder blade tucked down toward the middle of your spine. 3. Hold for 10 seconds. 4. Slowly return to the starting position. Repeat 2 times. Complete this exercise 3 times per week. Exercise C: Wand flexion, supine  1. Lie on your back. You [name] bend your knees for comfort. 2. Hold a broomstick, a cane, or a similar object so that your hands are about shoulder-width apart on the object. Your palms should face toward your feet. 3. Raise your left / right arm in front of your face, then behind your head (toward the floor). Use your other hand to help you do this. Stop when you feel a gentle stretch in your shoulder, or when you reach the angle that is recommended by your health care provider. 4. Hold for 3 seconds. 5. Use the broomstick and your other arm to help you return your left / right arm to the starting position. Repeat 2 times. Complete this exercise 3 times per  week. Exercise D: Wand shoulder external rotation 1. Stand and hold a broomstick, a cane, or a similar object so your handsare about shoulder-width apart on the object. 2. Start with your arms hanging down, then bend both elbows to an "L" shape (90 degrees). 3. Keep your left / right elbow at your side. Use your other hand to push the stick so your left / right forearm moves away from your body, out to your side. ? Keep your left / right elbow bent to 90 degrees and keep it against your side. ? Stop when you feel a gentle stretch in your shoulder, or when you  reach the angle recommended by your health care provider. 4. Hold for 10 seconds. 5. Use the stick to help you return your left / right arm to the starting position. Repeat 2 times. Complete this exercise 3 times per week. Strengthening exercises These exercises build strength and endurance in your shoulder. Endurance is the ability to use your muscles for a long time, even after your muscles get tired. Exercise E: Scapular protraction, standing 1. Stand so you are facing a wall. Place your feet about one arm-length away from the wall. 2. Place your hands on the wall and straighten your elbows. 3. Keep your hands on the wall as you push your upper back away from the wall. You should feel your shoulder blades sliding forward.Keep your elbows and your head still. ? If you are not sure that you are doing this exercise correctly, ask your health care provider for more instructions. 4. Hold for 3 seconds. 5. Slowly return to the starting position. Let your muscles relax completely before you repeat this exercise. Repeat 2 times. Complete this exercise 3 times per week. Exercise F: Shoulder blade squeezes  (scapular retraction) 1. Sit with good posture in a stable chair. Do not let your back touch the back of the chair. 2. Your arms should be at your sides with your elbows bent. You [name] rest your forearms on a pillow if that is more comfortable. 3. Squeeze your shoulder blades together. Bring them down and back. ? Keep your shoulders level. ? Do not lift your shoulders up toward your ears. 4. Hold for 3 seconds. 5. Return to the starting position. Repeat 2 times. Complete this exercise 3 times per week. This information is not intended to replace advice given to you by your health care provider. Make sure you discuss any questions you have with your health care provider. Document Released: 12/24/2004 Document Revised: 10/05/2015 Document Reviewed: 09/11/2014 Elsevier Interactive Patient  Education  2018 [place] (ROM) AND STRETCHING EXERCISES These exercises [name] help you when beginning to rehabilitate your injury. While completing these exercises, remember:   Restoring tissue flexibility helps normal motion to return to the joints. This allows healthier, less painful movement and activity.  An effective stretch should be held for at least 30 seconds.  A stretch should never be painful. You should only feel a gentle lengthening or release in the stretched tissue.  ROM - Pendulum  Bend at the waist so that your right / left arm falls away from your body. Support yourself with your opposite hand on a solid surface, such as a table or a countertop.  Your right / left arm should be perpendicular to the ground. If it is not perpendicular, you need to lean over farther. Relax the muscles in your right / left arm and shoulder as much as possible.  Gently sway your hips and trunk so they move your right / left arm without any use of your right / left shoulder muscles.  Progress your movements so that your right / left arm moves side to side, then forward and backward, and finally, both clockwise and counterclockwise.  Complete 10-15 repetitions in each direction. Many people use this exercise to relieve discomfort in their shoulder as well as to gain range of motion. Repeat 2 times. Complete this exercise 3 times per week.  STRETCH - Flexion, Standing  Stand with good posture. With an underhand grip on your right / left hand and an overhand grip on the opposite hand, grasp a broomstick or cane so that your hands are a little more than shoulder-width apart.  Keeping your right / left elbow straight and shoulder muscles relaxed, push the stick with your opposite hand to raise your right / left arm in front of your body and then overhead. Raise your arm until you feel a stretch in your right / left shoulder, but before you have increased shoulder  pain.  Try to avoid shrugging your right / left shoulder as your arm rises by keeping your shoulder blade tucked down and toward your mid-back spine. Hold 30 seconds.  Slowly return to the starting position. Repeat 2 times. Complete this exercise 3 times per week.  STRETCH - Internal Rotation  Place your right / left hand behind your back, palm-up.  Throw a towel or belt over your opposite shoulder. Grasp the towel/belt with your right / left hand.  While keeping an upright posture, gently pull up on the towel/belt until you feel a stretch in the front of your right / left shoulder.  Avoid shrugging your right / left shoulder as your arm rises by keeping your shoulder blade tucked down and toward your mid-back spine.  Hold 30. Release the stretch by lowering your opposite hand. Repeat 2 times. Complete this exercise 3 times per week.  STRETCH - External Rotation and Abduction  Stagger your stance through a doorframe. It does not matter which foot is forward.  As instructed by your physician, physical therapist or athletic trainer, place your hands: ? And forearms above your head and on the door frame. ? And forearms at head-height and on the door frame. ? At elbow-height and on the door frame.  Keeping your head and chest upright and your stomach muscles tight to prevent over-extending your low-back, slowly shift your weight onto your front foot until you feel a stretch across your chest and/or in the front of your shoulders.  Hold 30 seconds. Shift your weight to your back foot to release the stretch. Repeat 2 times. Complete this stretch 3 times per week.   STRENGTHENING EXERCISES  These exercises [name] help you when beginning to rehabilitate your injury. They [name] resolve your symptoms with or without further involvement from your physician, physical therapist or athletic trainer. While completing these exercises, remember:   Muscles can gain both the endurance and the strength  needed for everyday activities through controlled exercises.  Complete these exercises as instructed by your physician, physical therapist or athletic trainer. Progress the resistance and repetitions only as guided.  You [name] experience muscle soreness or fatigue, but the pain or discomfort you are trying to eliminate should never worsen during these exercises. If this pain does worsen, stop and make certain you are following the directions exactly. If the pain is still present after adjustments, discontinue the exercise until you can  discuss the trouble with your clinician.  If advised by your physician, during your recovery, avoid activity or exercises which involve actions that place your right / left hand or elbow above your head or behind your back or head. These positions stress the tissues which are trying to heal.  STRENGTH - Scapular Depression and Adduction  With good posture, sit on a firm chair. Supported your arms in front of you with pillows, arm rests or a table top. Have your elbows in line with the sides of your body.  Gently draw your shoulder blades down and toward your mid-back spine. Gradually increase the tension without tensing the muscles along the top of your shoulders and the back of your neck.  Hold for 3 seconds. Slowly release the tension and relax your muscles completely before completing the next repetition.  After you have practiced this exercise, remove the arm support and complete it in standing as well as sitting. Repeat 2 times. Complete this exercise 3 times per week.   STRENGTH - External Rotators  Secure a rubber exercise band/tubing to a fixed object so that it is at the same height as your right / left elbow when you are standing or sitting on a firm surface.  Stand or sit so that the secured exercise band/tubing is at your side that is not injured.  Bend your elbow 90 degrees. Place a folded towel or small pillow under your right / left arm so that  your elbow is a few inches away from your side.  Keeping the tension on the exercise band/tubing, pull it away from your body, as if pivoting on your elbow. Be sure to keep your body steady so that the movement is only coming from your shoulder rotating.  Hold 3 seconds. Release the tension in a controlled manner as you return to the starting position. Repeat 2 times. Complete this exercise 3 times per week.   STRENGTH - Supraspinatus  Stand or sit with good posture. Grasp a 2-3 lb weight or an exercise band/tubing so that your hand is "thumbs-up," like when you shake hands.  Slowly lift your right / left hand from your thigh into the air, traveling about 30 degrees from straight out at your side. Lift your hand to shoulder height or as far as you can without increasing any shoulder pain. Initially, many people do not lift their hands above shoulder height.  Avoid shrugging your right / left shoulder as your arm rises by keeping your shoulder blade tucked down and toward your mid-back spine.  Hold for 3 seconds. Control the descent of your hand as you slowly return to your starting position. Repeat 2 times. Complete this exercise 3 times per week.   STRENGTH - Shoulder Extensors  Secure a rubber exercise band/tubing so that it is at the height of your shoulders when you are either standing or sitting on a firm arm-less chair.  With a thumbs-up grip, grasp an end of the band/tubing in each hand. Straighten your elbows and lift your hands straight in front of you at shoulder height. Step back away from the secured end of band/tubing until it becomes tense.  Squeezing your shoulder blades together, pull your hands down to the sides of your thighs. Do not allow your hands to go behind you.  Hold for 3 seconds. Slowly ease the tension on the band/tubing as you reverse the directions and return to the starting position. Repeat 2 times. Complete this exercise 3 times per week.  STRENGTH -  Scapular Retractors  Secure a rubber exercise band/tubing so that it is at the height of your shoulders when you are either standing or sitting on a firm arm-less chair.  With a palm-down grip, grasp an end of the band/tubing in each hand. Straighten your elbows and lift your hands straight in front of you at shoulder height. Step back away from the secured end of band/tubing until it becomes tense.  Squeezing your shoulder blades together, draw your elbows back as you bend them. Keep your upper arm lifted away from your body throughout the exercise.  Hold 3 seconds. Slowly ease the tension on the band/tubing as you reverse the directions and return to the starting position. Repeat 2 times. Complete this exercise 3 times per week.  STRENGTH - Scapular Depressors  Find a sturdy chair without wheels, such as a from a dining room table.  Keeping your feet on the floor, lift your bottom from the seat and lock your elbows.  Keeping your elbows straight, allow gravity to pull your body weight down. Your shoulders will rise toward your ears.  Raise your body against gravity by drawing your shoulder blades down your back, shortening the distance between your shoulders and ears. Although your feet should always maintain contact with the floor, your feet should progressively support less body weight as you get stronger.  Hold 3 seconds. In a controlled and slow manner, lower your body weight to begin the next repetition. Repeat 2 times. Complete this exercise 3 times per week.   This information is not intended to replace advice given to you by your health care provider. Make sure you discuss any questions you have with your health care provider.  Document Released: 11/07/2004 Document Revised: 01/14/2014 Document Reviewed: 04/07/2008 Elsevier Interactive Patient Education [employer].

## 2019-09-10 NOTE — Progress Notes (Signed)
Chief Complaint  Patient presents with  . Breast Pain    left. Nipple tender  . Arm Pain    left arm pain    Subjective: Patient is a 74 y.o. female here for breast pain. Here w her daughter.   For the past week, has been having a sharp pain in L breast. No inj or change in activity. No radiation. Nml mm in 11/2018. No lumps, nipple discharge, redness, scaling/rash.   L shoulder pain she noticed this AM. No inj or change in activity. No redness, bruising, swelling. ROM is slightly decreased 2/2 pain. No neurologic s/s's.   Past Medical History:  Diagnosis Date  . Adenomatous colon polyp 03/1988  . Anemia    borderline  . Arthritis    R shoulder - degenerative   . Cataract    beginning stages  . Chest pain     Sept 2011: stress test neg  . Depression    sees Dr.Cotle  . Diabetes mellitus    dr Buddy Duty  . Diverticulosis   . Fatty liver    Increased LFTs, saw GI 06/2011, likely from fatty liver   . GERD (gastroesophageal reflux disease)   . History of hiatal hernia   . Hyperlipemia   . Hypertension   . IBS (irritable bowel syndrome)    and dyspepsia  . Memory impairment 08/2014   mild cognitive impairment vs. mild dementia, reommended reinstating of cholinesterase inhibitor   . Osteopenia    dexa 06/2007 and 10/11 Rx CA vitamin d  . RLS (restless legs syndrome)    h/o- no longer using Requip  . Sleep apnea    Mouth guard use at home  . Thyroid nodule     Objective: BP 140/84 (BP Location: Left Arm, Patient Position: Sitting, Cuff Size: Normal)   Pulse 66   Temp 98.3 F (36.8 C) (Oral)   Ht 5\' 6"  (1.676 m)   Wt 191 lb 6 oz (86.8 kg)   SpO2 97%   BMI 30.89 kg/m  General: Awake, appears stated age Lungs: No accessory muscle use MSK: +Hawkins, Neer's, Empty can on L; neg cross over, Speed's, lift off; nml ROM, no ttp or deformity Breast: Examined in presence of female chaperone Robin Ewing; no external lesion or gross deformity of breast. I appreciate no nodularity  or masses in breast or axillae on L. There is tenderness along the chest wall. When moving the breast tissue, the same part of the breast was not similarly tender, rather an area along mid clav line at approx R 4-6 was quite ttp. No crepitus or fluctuance noted.  Psych:  normal affect and mood  Assessment and Plan: Chest wall pain  Left shoulder pain, unspecified chronicity  1. Ice, heat, Tylenol. Tramadol for breakthrough pain. With exam, I think chest wall more likely affected. Pec stretches/exercises. If no improvement over next few weeks, will ck [place] and diag mammogram.  2. Stretches/exercises. I want her to f/u in 1 mo to reck these things. She may need PT vs sports med vs injection The patient and her daughter voiced understanding and agreement to the plan.  Coral, DO 09/10/19  11:03 AM

## 2019-09-10 NOTE — Telephone Encounter (Signed)
Called informed of response and verbalized understanding.

## 2019-09-10 NOTE — Telephone Encounter (Signed)
For what? I am happy to sign off on a referral. For today, we decided to hold off on breast imaging as her breast tenderness fluctuated, but the chest wall pain was constant. Ty.

## 2019-09-22 ENCOUNTER — Ambulatory Visit: Payer: Medicare Other | Admitting: Internal Medicine

## 2019-10-02 ENCOUNTER — Other Ambulatory Visit (HOSPITAL_COMMUNITY): Payer: Self-pay | Admitting: Nurse Practitioner

## 2019-10-02 ENCOUNTER — Other Ambulatory Visit (HOSPITAL_COMMUNITY): Payer: Self-pay

## 2019-10-02 DIAGNOSIS — I10 Essential (primary) hypertension: Secondary | ICD-10-CM

## 2019-10-02 DIAGNOSIS — E118 Type 2 diabetes mellitus with unspecified complications: Secondary | ICD-10-CM

## 2019-10-02 DIAGNOSIS — U071 COVID-19: Secondary | ICD-10-CM

## 2019-10-02 NOTE — Progress Notes (Signed)
I connected by phone with [name] on 10/02/2019 at 5:22 PM to discuss the potential use of a new treatment for mild to moderate COVID-19 viral infection in non-hospitalized patients.  This patient is a 74 y.o. female that meets the FDA criteria for Emergency Use Authorization of COVID monoclonal antibody casirivimab/imdevimab or bamlanivimab/eteseviamb.  Has a (+) direct SARS-CoV-2 viral test result  Has mild or moderate COVID-19   Is NOT hospitalized due to COVID-19  Is within 10 days of symptom onset  Has at least one of the high risk factor(s) for progression to severe COVID-19 and/or hospitalization as defined in EUA.  Specific high risk criteria : Older age (>/= 74 yo), Diabetes and Cardiovascular disease or hypertension   I have spoken and communicated the following to the patient or parent/caregiver regarding COVID monoclonal antibody treatment:  1. FDA has authorized the emergency use for the treatment of mild to moderate COVID-19 in adults and pediatric patients with positive results of direct SARS-CoV-2 viral testing who are 63 years of age and older weighing at least 40 kg, and who are at high risk for progressing to severe COVID-19 and/or hospitalization.  2. The significant known and potential risks and benefits of COVID monoclonal antibody, and the extent to which such potential risks and benefits are unknown.  3. Information on available alternative treatments and the risks and benefits of those alternatives, including clinical trials.  4. Patients treated with COVID monoclonal antibody should continue to self-isolate and use infection control measures (e.g., wear mask, isolate, social distance, avoid sharing personal items, clean and disinfect "high touch" surfaces, and frequent handwashing) according to CDC guidelines.   5. The patient or parent/caregiver has the option to accept or refuse COVID monoclonal antibody treatment.  After reviewing this information with  the patient, the patient has agreed to receive one of the available covid 19 monoclonal antibodies and will be provided an appropriate fact sheet prior to infusion. Sahithi Ordoyne Heide Scales, NP 10/02/2019 5:22 PM

## 2019-10-03 ENCOUNTER — Ambulatory Visit (HOSPITAL_COMMUNITY)
Admission: RE | Admit: 2019-10-03 | Discharge: 2019-10-03 | Disposition: A | Discharge status: Home / Self Care | Payer: Medicare Other | Source: Ambulatory Visit | Attending: Pulmonary Disease | Admitting: Pulmonary Disease

## 2019-10-03 ENCOUNTER — Other Ambulatory Visit: Payer: Self-pay | Admitting: Physician Assistant

## 2019-10-03 DIAGNOSIS — U071 COVID-19: Secondary | ICD-10-CM | POA: Insufficient documentation

## 2019-10-03 DIAGNOSIS — E118 Type 2 diabetes mellitus with unspecified complications: Secondary | ICD-10-CM | POA: Diagnosis present

## 2019-10-03 DIAGNOSIS — I10 Essential (primary) hypertension: Secondary | ICD-10-CM | POA: Diagnosis present

## 2019-10-03 DIAGNOSIS — Z23 Encounter for immunization: Secondary | ICD-10-CM | POA: Diagnosis not present

## 2019-10-03 MED ORDER — EPINEPHRINE 0.3 MG/0.3ML IJ SOAJ: 0.3000 mg | Freq: Once | INTRAMUSCULAR | Status: DC | PRN

## 2019-10-03 MED ORDER — ONDANSETRON HCL 4 MG/2ML IJ SOLN
4.0000 mg | Freq: Once | INTRAMUSCULAR | Status: AC
  Administered 2019-10-03: 4 mg via INTRAVENOUS
  Filled 2019-10-03: qty 2

## 2019-10-03 MED ORDER — PROMETHAZINE-DM 6.25-15 MG/5ML PO SYRP: 5.0000 mL | ORAL_SOLUTION | Freq: Four times a day (QID) | ORAL | 0 refills | Status: DC | PRN

## 2019-10-03 MED ORDER — SODIUM CHLORIDE 0.9 % IV SOLN: INTRAVENOUS | Status: DC | PRN

## 2019-10-03 MED ORDER — FAMOTIDINE IN NACL 20-0.9 MG/50ML-% IV SOLN: 20.0000 mg | Freq: Once | INTRAVENOUS | Status: DC | PRN

## 2019-10-03 MED ORDER — METHYLPREDNISOLONE SODIUM SUCC 125 MG IJ SOLR: 125.0000 mg | Freq: Once | INTRAMUSCULAR | Status: DC | PRN

## 2019-10-03 MED ORDER — DIPHENHYDRAMINE HCL 50 MG/ML IJ SOLN: 50.0000 mg | Freq: Once | INTRAMUSCULAR | Status: DC | PRN

## 2019-10-03 MED ORDER — SODIUM CHLORIDE 0.9 % IV SOLN
1200.0000 mg | Freq: Once | INTRAVENOUS | Status: AC
  Administered 2019-10-03: 1200 mg via INTRAVENOUS

## 2019-10-03 MED ORDER — ALBUTEROL SULFATE HFA 108 (90 BASE) MCG/ACT IN AERS: 2.0000 | INHALATION_SPRAY | Freq: Once | RESPIRATORY_TRACT | Status: DC | PRN

## 2019-10-03 NOTE — Discharge Instructions (Signed)

## 2019-10-03 NOTE — Progress Notes (Signed)
  Diagnosis: COVID-19  Physician: Dr Joya Gaskins  Procedure: [facility] Med: casirivimab\imdevimab infusion - Provided patient with casirivimab\imdevimab fact sheet for patients, parents and caregivers prior to infusion.  Complications: No immediate complications noted. Zofran given for nausea.  Tolerated well  Discharge: Discharged home   [name] 10/03/2019

## 2019-10-04 ENCOUNTER — Telehealth: Payer: Self-pay

## 2019-10-04 NOTE — Telephone Encounter (Signed)
Nurse Assessment Nurse: Caryn Section, RN, Butch Penny Date/Time (Eastern Time): 10/02/2019 1:17:17 PM Confirm and document reason for call. If symptomatic, describe symptoms. ---Caller states that her mother tested pos for COVID on 09/30/2019. She is having nasal congestion, fever and coughing. No new or worsening symptoms since she was last seen. Wanting to get antibody infusion referral. Does the patient have any new or worsening symptoms? ---No Please document clinical information provided and list any resource used. ---Caller instructed to call the office on Monday AM for referral and to call back before then for new or worsening symptoms. Pt's daughter verbalized understanding. Disp. Time Eilene Ghazi Time) Disposition Final User 10/02/2019 1:20:15 PM Clinical Call Yes Caryn Section, RN, Butch Penny  Pt had infusion yesterday.

## 2019-10-07 ENCOUNTER — Other Ambulatory Visit: Payer: Medicare Other

## 2019-10-12 ENCOUNTER — Telehealth: Payer: Self-pay

## 2019-10-12 NOTE — Telephone Encounter (Signed)
Pt's daughter called stating pt is complaining of chest tightness.  Pt still has fatigue although congestion from Covid has cleared up.  Caller transferred to Triage.

## 2019-10-13 NOTE — Telephone Encounter (Signed)
Nurse Assessment Nurse: Lindsay Christen, RN, [name] Date/Time [name] Time): 10/12/2019 2:57:36 PM Confirm and document reason for call. If symptomatic, describe symptoms. ---Caller states she is calling on her mother which has COVID sx started 2 weeks ago has had a mild case. Feeling kind of tight in her chest and fatigue at this time that started today. Is in bed right now. Lives in a facility for independent living. Will conference her mother in the call. Has been trying to do deep breaths. Last temp 98.3. Does the patient have any new or worsening symptoms? ---Yes Will a triage be completed? ---Yes Related visit to physician within the last 2 weeks? ---No Does the PT have any chronic conditions? (i.e. diabetes, asthma, this includes High risk factors for pregnancy, etc.) ---Yes List chronic conditions. ---HTN. DM2. Is this a behavioral health or substance abuse call? ---No Guidelines Guideline Title Affirmed Question Affirmed Notes Nurse Date/Time [name] Time) Chest Pain [1] Chest pain lasts > 5 minutes AND [2] age > 65 [name] 10/12/2019 3:01:15 PM Disp. Time [name] Time) Disposition Final User 10/12/2019 2:53:50 PM Send to Urgent Queue [name], [name] PLEASE NOTE: All timestamps contained within this report are represented as [place] Standard Time. CONFIDENTIALTY NOTICE: This fax transmission is intended only for the addressee. It contains information that is legally privileged, confidential or otherwise protected from use or disclosure. If you are not the intended recipient, you are strictly prohibited from reviewing, disclosing, copying using or disseminating any of this information or taking any action in reliance on or regarding this information. If you have received this fax in error, please notify us immediately by telephone so that we can arrange for its return to [place]. Phone: [phone], Toll-Free: [phone], Fax: [phone] Page: 2 of 2 Call Id: [id number] [name].  Time [name] Time) Disposition Final User 10/12/2019 3:13:25 PM 911 Outcome Documentation Lindsay Christen, RN, [name] Reason: Caller states that she spoke with her sister and plan on calling for ambulance. Urged caller to call back if unable to reach EMS or need [place] for anything else. Verbalized understanding. 10/12/2019 3:05:15 PM Call EMS 911 Now Yes Lindsay Christen, RN, [name] Disagree/Comply Comply Caller Understands Yes PreDisposition InappropriateToAsk Care Advice Given Per Guideline CALL EMS 911 NOW: CARE ADVICE given per Chest Pain (Adult) guideline. * Triager Discretion: I'll call you back in a few minutes to be sure you were able to reach them. Comments User: Lindsay Moore, RN Date/Time [name] Time): 10/12/2019 3:00:44 PM Did get antibody infusion on Sunday after symptoms on 9/26

## 2019-10-18 ENCOUNTER — Ambulatory Visit: Payer: Medicare Other | Admitting: Internal Medicine

## 2019-10-26 ENCOUNTER — Ambulatory Visit: Payer: Medicare Other | Admitting: Medical

## 2019-10-27 ENCOUNTER — Ambulatory Visit (INDEPENDENT_AMBULATORY_CARE_PROVIDER_SITE_OTHER): Payer: Medicare Other | Admitting: Medical

## 2019-10-27 ENCOUNTER — Ambulatory Visit: Payer: Self-pay

## 2019-10-27 ENCOUNTER — Ambulatory Visit (INDEPENDENT_AMBULATORY_CARE_PROVIDER_SITE_OTHER): Payer: Medicare Other | Admitting: Family Medicine

## 2019-10-27 ENCOUNTER — Other Ambulatory Visit: Payer: Self-pay

## 2019-10-27 ENCOUNTER — Encounter: Payer: Self-pay | Admitting: Family Medicine

## 2019-10-27 VITALS — BP 120/70 | HR 60 | Temp 97.9°F | Resp 18 | Ht 66.0 in | Wt 190.4 lb

## 2019-10-27 VITALS — BP 122/78 | HR 76 | Ht 66.0 in | Wt 190.0 lb

## 2019-10-27 DIAGNOSIS — M65311 Trigger thumb, right thumb: Secondary | ICD-10-CM

## 2019-10-27 DIAGNOSIS — M79644 Pain in right finger(s): Secondary | ICD-10-CM | POA: Diagnosis not present

## 2019-10-27 MED ORDER — TRIAMCINOLONE ACETONIDE 40 MG/ML IJ SUSP
40.0000 mg | Freq: Once | INTRAMUSCULAR | Status: AC
  Administered 2019-10-27: 40 mg via INTRA_ARTICULAR

## 2019-10-27 NOTE — Progress Notes (Signed)
Medication Samples have been provided to the patient.  Drug name: Pennsaid       Strength: 2%        Qty: 1 Box  LOT: [id number]  Exp.Date: 03/2020  Dosing instructions: Use a pea size amount and rub gently.  The patient has been instructed regarding the correct time, dose, and frequency of taking this medication, including desired effects and most common side effects.   Sherrie George, MA 4:15 PM 10/27/2019

## 2019-10-27 NOTE — Progress Notes (Signed)
Subjective:    Patient ID: [name], female    DOB: [birth year], 74 y.o.   MRN: [id number]  HPI  Pt in with 7 days of rt thumb pain. Pt states she was bringing in potted plants today made pain more noticed. No trauma but noted pain around that time.  Pt has splint that appears to be version of thumb spica. Wearing the splint does help.  Some pain at distal joint and and base.  Pt is rt handed.    Review of Systems  Constitutional: Negative for chills and fatigue.  Respiratory: Negative for cough.   Cardiovascular: Negative for chest pain.  Musculoskeletal:       Rt thumb pain.  Skin: Negative for rash.    Past Medical History:  Diagnosis Date  . Adenomatous colon polyp 03/1988  . Anemia    borderline  . Arthritis    R shoulder - degenerative   . Cataract    beginning stages  . Chest pain     Sept 2011: stress test neg  . Depression    sees Dr.Cotle  . Diabetes mellitus    dr Buddy Duty  . Diverticulosis   . Fatty liver    Increased LFTs, saw GI 06/2011, likely from fatty liver   . GERD (gastroesophageal reflux disease)   . History of hiatal hernia   . Hyperlipemia   . Hypertension   . IBS (irritable bowel syndrome)    and dyspepsia  . Memory impairment 08/2014   mild cognitive impairment vs. mild dementia, reommended reinstating of cholinesterase inhibitor   . Osteopenia    dexa 06/2007 and 10/11 Rx CA vitamin d  . RLS (restless legs syndrome)    h/o- no longer using Requip  . Sleep apnea    Mouth guard use at home  . Thyroid nodule      Social History   Socioeconomic History  . Marital status: Widowed    Spouse name: [name]  . Number of children: 3  . Years of education: Not on file  . Highest education level: Not on file  Occupational History  . Occupation: retired, was a Leisure centre manager)    Comment: RN  Tobacco Use  . Smoking status: Former Smoker    Packs/day: 2.00    Years: 10.00    Pack years: 20.00    Types: Cigarettes    Quit  date: 11/26/1975    Years since quitting: 43.9  . Smokeless tobacco: Never Used  . Tobacco comment: used to smoke 2 ppd  Vaping Use  . Vaping Use: Never used  Substance and Sexual Activity  . Alcohol use: Yes    Alcohol/week: 0.0 standard drinks    Comment: rarely wine  . Drug use: No  . Sexual activity: Not Currently  Other Topics Concern  . Not on file  Social History Narrative   Widow , lives by herself at [facility] in a 1 bedroom apt;  still drives (limited) , daughters [name]) live  in [place] in [place]     5 g-children   Social Determinants of Health   Financial Resource Strain:   . Difficulty of Paying Living Expenses: Not on file  Food Insecurity:   . Worried About Charity fundraiser in the Last Year: Not on file  . Ran Out of Food in the Last Year: Not on file  Transportation Needs:   . Lack of Transportation (Medical): Not on file  . Lack of Transportation (  Non-Medical): Not on file  Physical Activity:   . Days of Exercise per Week: Not on file  . Minutes of Exercise per Session: Not on file  Stress:   . Feeling of Stress : Not on file  Social Connections:   . Frequency of Communication with Friends and Family: Not on file  . Frequency of Social Gatherings with Friends and Family: Not on file  . Attends Religious Services: Not on file  . Active Member of Clubs or Organizations: Not on file  . Attends Archivist Meetings: Not on file  . Marital Status: Not on file  Intimate Partner Violence:   . Fear of Current or Ex-Partner: Not on file  . Emotionally Abused: Not on file  . Physically Abused: Not on file  . Sexually Abused: Not on file    Past Surgical History:  Procedure Laterality Date  . BREAST BIOPSY Left 10/11/2015   fibroadenoma w/ calcifications, no evidence of malignancy, recommend mammogram repeat-6 mnths  . COLONOSCOPY  04/15/2014  . POLYPECTOMY    . REVERSE SHOULDER ARTHROPLASTY Right 04/21/2014   Procedure: REVERSE  SHOULDER ARTHROPLASTY;  Surgeon: Tania Ade, MD;  Location: [facility];  Service: Orthopedics;  Laterality: Right;  Right reverse total shoulder replacement  . TONSILLECTOMY    . UTERINE FIBROID EMBOLIZATION     90s    Family History  Problem Relation Age of Onset  . Lung cancer Mother        smoker  . Colon cancer Father        F dx in his 64s  . Ovarian cancer Paternal Aunt        ?  . Diabetes Other        aunts-uncles   . Stroke Other        aunts-uncles   . Heart attack Maternal Grandmother        60s  . Other Other        niece- glioblastoma  . Breast cancer Neg Hx   . Rectal cancer Neg Hx   . Stomach cancer Neg Hx   . Esophageal cancer Neg Hx   . Colon polyps Neg Hx     Allergies  Allergen Reactions  . Sulfa Antibiotics Nausea Only  . Trulicity [Dulaglutide] Nausea Only  . Penicillins Rash and Other (See Comments)    Has patient had a PCN reaction causing immediate rash, facial/tongue/throat swelling, SOB or lightheadedness with hypotension: No Has patient had a PCN reaction causing severe rash involving mucus membranes or skin necrosis: No Has patient had a PCN reaction that required hospitalization No Has patient had a PCN reaction occurring within the last 10 years: No If all of the above answers are "NO", then may proceed with Cephalosporin use.    Current Outpatient Medications on File Prior to Visit  Medication Sig Dispense Refill  . ACCU-CHEK FASTCLIX LANCETS MISC daily. use as directed  4  . acetaminophen (TYLENOL) 325 MG tablet Take 650 mg by mouth every 6 (six) hours as needed for mild pain or headache.    Marland Kitchen aspirin (QC LO-DOSE ASPIRIN) 81 MG EC tablet Take 1 tablet (81 mg total) by mouth daily. 90 tablet 3  . atenolol (TENORMIN) 50 MG tablet Take 25 mg by mouth daily.    Marland Kitchen atorvastatin (LIPITOR) 40 MG tablet Take 1 tablet (40 mg total) by mouth daily. 90 tablet 3  . azelastine (ASTELIN) 0.1 % nasal spray Place 2 sprays into both nostrils at bedtime as  needed for rhinitis. Use in each nostril as directed 30 mL 3  . BD VEO INSULIN SYRINGE U/F 31G X 15/64" 1 ML MISC USE TO INJECT insulin EVERY DAY  11  . Cyanocobalamin (VITAMIN B12 TR) 1000 MCG TBCR Take 1 tablet (1,000 mcg total) by mouth daily. 90 tablet 3  . donepezil (ARICEPT) 10 MG tablet Take 2 tablets (20 mg total) by mouth at bedtime.    . Ferrous Fumarate-Vitamin C ER (FERRO-SEQUELS) 65-25 MG TBCR Take 1 tablet by mouth daily. 90 tablet 3  . gabapentin (NEURONTIN) 100 MG capsule Take 1 capsule (100 mg total) by mouth at bedtime. 90 capsule 1  . LEVEMIR 100 UNIT/ML injection Inject into the skin.    Marland Kitchen losartan (COZAAR) 25 MG tablet TAKE 1 TABLET BY MOUTH EVERY EVENING 90 tablet 1  . memantine (NAMENDA) 10 MG tablet Take 10 mg by mouth 2 (two) times daily.  3  . metFORMIN (GLUCOPHAGE-XR) 500 MG 24 hr tablet TAKE 1 TABLET BY MOUTH EVERY MORNING WITH A MEAL    . Multiple Vitamins-Minerals (CENTRUM SILVER 50+WOMEN) TABS Take 1 tablet by mouth daily. 90 tablet 3  . ONE TOUCH ULTRA TEST test strip USE TO CHECK BLOOD SUGAR EVERY DAY  4  . pantoprazole (PROTONIX) 40 MG tablet Take 1 tablet (40 mg total) by mouth 2 (two) times daily before a meal. 180 tablet 3  . sertraline (ZOLOFT) 100 MG tablet TAKE 1 TABLET BY MOUTH EVERY DAY AND TAKE 1/2 TABLET AT BEDTIME  2  . sodium fluoride (PREVIDENT 5000 DRY MOUTH) 1.1 % GEL dental gel Place 1 application onto teeth at bedtime as needed (for teeth).     . traMADol (ULTRAM) 50 MG tablet Take 1 tablet (50 mg total) by mouth every 12 (twelve) hours as needed. 20 tablet 0  . darifenacin (ENABLEX) 15 MG 24 hr tablet Take 1 tablet (15 mg total) by mouth daily. (Patient not taking: Reported on 10/27/2019) 90 tablet 3  . promethazine-dextromethorphan (PROMETHAZINE-DM) 6.25-15 MG/5ML syrup Take 5 mLs by mouth 4 (four) times daily as needed for cough. (Patient not taking: Reported on 10/27/2019) 118 mL 0   No current facility-administered medications on file prior  to visit.    BP (!) 139/114   Pulse 60   Temp 97.9 F (36.6 C) (Oral)   Resp 18   Ht 5\' 6"  (1.676 m)   Wt 190 lb 6.4 oz (86.4 kg)   SpO2 98%   BMI 30.73 kg/m       Objective:   Physical Exam  General- No acute distress. Pleasant patient. Neck- Full range of motion, no jvd Lungs- Clear, even and unlabored. Heart- regular rate and rhythm. Neurologic- CNII- XII grossly intact.  Rt thumb- no swelling, no redness. +finklestein test. Also distal joint gets stuck flexed and she has to try hard to extend.      Assessment & Plan:  You do have rt thumb pain with features of tenosynovitis of rt thumb and possible trigger finger. Continue splint you are using.  I put xray of thumb order to be done today.  Also placed referral for sports medicine. You can go over now and ask them if they can see you today or later this week.  I think low dose tylenol and low dose ibuprofen best options for pain control. Follow advise of sport medicine  Follow up with pcp as regullarly scheduled or with our office as needed/particularly if unexpected  Appointment delay to sport med

## 2019-10-27 NOTE — Patient Instructions (Signed)
You do have rt thumb pain with features of tenosynovitis of rt thumb and possible trigger finger. Continue splint you are using.  I put xray of thumb order to be done today.  Also placed referral for sports medicine. You can go over now and ask them if they can see you today or later this week.  I think low dose tylenol and low dose ibuprofen best options for pain control. Follow advise of sport medicine  Follow up with pcp as regullarly scheduled or with our office as needed/particularly if unexpected  Appointment delay to sport med

## 2019-10-27 NOTE — Progress Notes (Signed)
[name] - 74 y.o. female MRN [id number]  Date of birth: [birth year]  SUBJECTIVE:  Including CC & ROS.  Chief Complaint  Patient presents with  . Hand Pain    right thumb x 1-2 weeks    [name] is a 74 y.o. female that is presenting with right thumb pain.  She is actually doing intermittent.  Is occurring over the flexor surface of the thumb.  No history of similar pain.  No history of surgery..   Review of Systems See HPI   HISTORY: Past Medical, Surgical, Social, and Family History Reviewed & Updated per EMR.   Pertinent Historical Findings include:  Past Medical History:  Diagnosis Date  . Adenomatous colon polyp 03/1988  . Anemia    borderline  . Arthritis    R shoulder - degenerative   . Cataract    beginning stages  . Chest pain     Sept 2011: stress test neg  . Depression    sees Dr.Cotle  . Diabetes mellitus    dr Buddy Duty  . Diverticulosis   . Fatty liver    Increased LFTs, saw GI 06/2011, likely from fatty liver   . GERD (gastroesophageal reflux disease)   . History of hiatal hernia   . Hyperlipemia   . Hypertension   . IBS (irritable bowel syndrome)    and dyspepsia  . Memory impairment 08/2014   mild cognitive impairment vs. mild dementia, reommended reinstating of cholinesterase inhibitor   . Osteopenia    dexa 06/2007 and 10/11 Rx CA vitamin d  . RLS (restless legs syndrome)    h/o- no longer using Requip  . Sleep apnea    Mouth guard use at home  . Thyroid nodule     Past Surgical History:  Procedure Laterality Date  . BREAST BIOPSY Left 10/11/2015   fibroadenoma w/ calcifications, no evidence of malignancy, recommend mammogram repeat-6 mnths  . COLONOSCOPY  04/15/2014  . POLYPECTOMY    . REVERSE SHOULDER ARTHROPLASTY Right 04/21/2014   Procedure: REVERSE SHOULDER ARTHROPLASTY;  Surgeon: Tania Ade, MD;  Location: [facility];  Service: Orthopedics;  Laterality: Right;  Right reverse total shoulder replacement  . TONSILLECTOMY    .  UTERINE FIBROID EMBOLIZATION     90s    Family History  Problem Relation Age of Onset  . Lung cancer Mother        smoker  . Colon cancer Father        F dx in his 86s  . Ovarian cancer Paternal Aunt        ?  . Diabetes Other        aunts-uncles   . Stroke Other        aunts-uncles   . Heart attack Maternal Grandmother        60s  . Other Other        niece- glioblastoma  . Breast cancer Neg Hx   . Rectal cancer Neg Hx   . Stomach cancer Neg Hx   . Esophageal cancer Neg Hx   . Colon polyps Neg Hx     Social History   Socioeconomic History  . Marital status: Widowed    Spouse name: [name]  . Number of children: 3  . Years of education: Not on file  . Highest education level: Not on file  Occupational History  . Occupation: retired, was a Leisure centre manager)    Comment: RN  Tobacco Use  . Smoking status: Former Smoker  Packs/day: 2.00    Years: 10.00    Pack years: 20.00    Types: Cigarettes    Quit date: 11/26/1975    Years since quitting: 43.9  . Smokeless tobacco: Never Used  . Tobacco comment: used to smoke 2 ppd  Vaping Use  . Vaping Use: Never used  Substance and Sexual Activity  . Alcohol use: Yes    Alcohol/week: 0.0 standard drinks    Comment: rarely wine  . Drug use: No  . Sexual activity: Not Currently  Other Topics Concern  . Not on file  Social History Narrative   Widow , lives by herself at [facility] in a 1 bedroom apt;  still drives (limited) , daughters [name]) live  in [place] in [place]     5 g-children   Social Determinants of Health   Financial Resource Strain:   . Difficulty of Paying Living Expenses: Not on file  Food Insecurity:   . Worried About Charity fundraiser in the Last Year: Not on file  . Ran Out of Food in the Last Year: Not on file  Transportation Needs:   . Lack of Transportation (Medical): Not on file  . Lack of Transportation (Non-Medical): Not on file  Physical Activity:   . Days of  Exercise per Week: Not on file  . Minutes of Exercise per Session: Not on file  Stress:   . Feeling of Stress : Not on file  Social Connections:   . Frequency of Communication with Friends and Family: Not on file  . Frequency of Social Gatherings with Friends and Family: Not on file  . Attends Religious Services: Not on file  . Active Member of Clubs or Organizations: Not on file  . Attends Archivist Meetings: Not on file  . Marital Status: Not on file  Intimate Partner Violence:   . Fear of Current or Ex-Partner: Not on file  . Emotionally Abused: Not on file  . Physically Abused: Not on file  . Sexually Abused: Not on file     PHYSICAL EXAM:  VS: BP 122/78   Pulse 76   Ht 5\' 6"  (1.676 m)   Wt 190 lb (86.2 kg)   BMI 30.67 kg/m  Physical Exam Gen: NAD, alert, cooperative with exam, well-appearing MSK:  Right thumb: Degenerative changes in the [facility] joint. Treatment evident on flexion. Tenderness palpation over the flexor tendon. Neurovascular intact  Limited ultrasound: Right thumb:  Degenerative changes of the IP joint as well as MCP joint. Triggering evident on dynamic testing.  Summary: Trigger thumb  Ultrasound and interpretation by Clearance Coots, MD   Aspiration/Injection Procedure Note [facility] Jul 11, 1945  Procedure: Injection Indications: Right thumb pain  Procedure Details Consent: Risks of procedure as well as the alternatives and risks of each were explained to the (patient/caregiver).  Consent for procedure obtained. Time Out: Verified patient identification, verified procedure, site/side was marked, verified correct patient position, special equipment/implants available, medications/allergies/relevent history reviewed, required imaging and test results available.  Performed.  The area was cleaned with iodine and alcohol swabs.    The  right trigger thumb was injected using 1 cc's of 40 mg Kenalog and 1 cc's of 0.25% bupivacaine with  a 25 1 1/2" needle.  Ultrasound was used. Images were obtained in long views showing the injection.     A sterile dressing was applied.  Patient did tolerate procedure well.    ASSESSMENT & PLAN:   Trigger finger  of right thumb Triggering evident on exam. -Counseled supportive care. -Counseled on splint. -Injection. -Could consider reinjection.

## 2019-10-27 NOTE — Patient Instructions (Signed)
Nice to meet you  Please try the splint at night or during the day if it is bothering you  Please try the rub on medicine  Please send me a message in [place] with any questions or updates.  Please see me back in 3 weeks.   --Dr. Raeford Razor

## 2019-10-27 NOTE — Assessment & Plan Note (Signed)
Triggering evident on exam. -Counseled supportive care. -Counseled on splint. -Injection. -Could consider reinjection.

## 2019-10-28 ENCOUNTER — Ambulatory Visit (HOSPITAL_BASED_OUTPATIENT_CLINIC_OR_DEPARTMENT_OTHER)
Admission: RE | Admit: 2019-10-28 | Discharge: 2019-10-28 | Disposition: A | Discharge status: Home / Self Care | Payer: Medicare Other | Source: Ambulatory Visit | Attending: Medical | Admitting: Medical

## 2019-10-28 DIAGNOSIS — M79644 Pain in right finger(s): Secondary | ICD-10-CM | POA: Insufficient documentation

## 2019-10-28 LAB — HM MAMMOGRAPHY

## 2019-10-29 ENCOUNTER — Encounter: Payer: Self-pay | Admitting: Internal Medicine

## 2019-10-30 ENCOUNTER — Other Ambulatory Visit: Payer: Self-pay | Admitting: Internal Medicine

## 2019-11-15 ENCOUNTER — Ambulatory Visit: Payer: Self-pay | Admitting: *Deleted

## 2019-11-17 ENCOUNTER — Ambulatory Visit: Payer: Medicare Other | Admitting: Family Medicine

## 2019-11-18 ENCOUNTER — Ambulatory Visit: Payer: Medicare Other | Admitting: Family Medicine

## 2019-12-01 ENCOUNTER — Ambulatory Visit: Payer: Medicare Other | Admitting: Internal Medicine

## 2019-12-01 ENCOUNTER — Telehealth: Payer: Self-pay | Admitting: Internal Medicine

## 2019-12-01 NOTE — Telephone Encounter (Signed)
Medication: phenazopyridine (PYRIDIUM) 200 MG tablet [[id number]]    Has the patient contacted their pharmacy? No. (If no, request that the patient contact the pharmacy for the refill.) (If yes, when and what did the pharmacy advise?)  Preferred Pharmacy (with phone number or street name): [facility] - [place], [place] - [address]  [address], [place] [address]  Phone:  [phone] Fax:  [phone]  DEA #:  --  Agent: Please be advised that RX refills may take up to 3 business days. We ask that you follow-up with your pharmacy.

## 2019-12-01 NOTE — Telephone Encounter (Signed)
Needs to call urology please.

## 2019-12-01 NOTE — Telephone Encounter (Signed)
Patient scheduled for office visit today.

## 2019-12-07 ENCOUNTER — Ambulatory Visit: Payer: Medicare Other | Admitting: Internal Medicine

## 2019-12-08 ENCOUNTER — Ambulatory Visit: Payer: Medicare Other | Admitting: Podiatry

## 2019-12-13 ENCOUNTER — Other Ambulatory Visit: Payer: Self-pay | Admitting: Internal Medicine

## 2019-12-28 ENCOUNTER — Other Ambulatory Visit: Payer: Self-pay

## 2019-12-28 ENCOUNTER — Encounter: Payer: Self-pay | Admitting: Family Medicine

## 2019-12-28 ENCOUNTER — Telehealth (INDEPENDENT_AMBULATORY_CARE_PROVIDER_SITE_OTHER): Payer: Medicare Other | Admitting: Family Medicine

## 2019-12-28 ENCOUNTER — Telehealth: Payer: Self-pay | Admitting: Family Medicine

## 2019-12-28 DIAGNOSIS — J40 Bronchitis, not specified as acute or chronic: Secondary | ICD-10-CM | POA: Insufficient documentation

## 2019-12-28 MED ORDER — AZITHROMYCIN 250 MG PO TABS: ORAL_TABLET | ORAL | 0 refills | Status: DC

## 2019-12-28 NOTE — Telephone Encounter (Signed)
Patient states she saw Dr Etter Sjogren, today via [place]. Patient states her congestion just started and patient did have covid sometime ago and patient would just like to know that congestion just started. Patient would like to know if she should get and xray   Please advise

## 2019-12-28 NOTE — Telephone Encounter (Signed)
I spoke to her about this and put order in for xray while we were talking --- we discussed this

## 2019-12-28 NOTE — Telephone Encounter (Signed)
FYI

## 2019-12-28 NOTE — Progress Notes (Signed)
Virtual Visit via Video Note  I connected with [name] on 12/28/19 at  3:40 PM EST by a video enabled telemedicine application and verified that I am speaking with the correct person using two identifiers.  Location: Patient: home alone-- friends home  Provider: office    I discussed the limitations of evaluation and management by telemedicine and the availability of in person appointments. The patient expressed understanding and agreed to proceed.  History of Present Illness: Pt is home alone c/o cough, it is productive , no fever , scratchy throat  Symptoms started 12/ 7 and has not improved-- she has tried mucinex with little relief and she has tried robitussin as well  She had covid in the fall and has had the vaccine   mucus is green/ yellow ant thick   Observations/Objective: There were no vitals filed for this visit. Pt is in nad  Assessment and Plan: 1. Bronchitis cxr  abx per orders Pt has phenergan dm  Call with questions / concerns covid test - azithromycin (ZITHROMAX Z-PAK) 250 MG tablet; As directed  Dispense: 6 each; Refill: 0 - DG Chest 2 View; Future  Follow Up Instructions:    I discussed the assessment and treatment plan with the patient. The patient was provided an opportunity to ask questions and all were answered. The patient agreed with the plan and demonstrated an understanding of the instructions.   The patient was advised to call back or seek an in-person evaluation if the symptoms worsen or if the condition fails to improve as anticipated.  I provided 25 minutes of non-face-to-face time during this encounter.   Lindsay Held, DO

## 2019-12-29 ENCOUNTER — Ambulatory Visit: Payer: Medicare Other | Admitting: Internal Medicine

## 2019-12-29 ENCOUNTER — Ambulatory Visit (HOSPITAL_BASED_OUTPATIENT_CLINIC_OR_DEPARTMENT_OTHER)
Admission: RE | Admit: 2019-12-29 | Discharge: 2019-12-29 | Disposition: A | Discharge status: Home / Self Care | Payer: Medicare Other | Source: Ambulatory Visit | Attending: Family Medicine | Admitting: Family Medicine

## 2019-12-29 ENCOUNTER — Other Ambulatory Visit: Payer: Self-pay

## 2019-12-29 DIAGNOSIS — J40 Bronchitis, not specified as acute or chronic: Secondary | ICD-10-CM | POA: Diagnosis present

## 2019-12-29 NOTE — Telephone Encounter (Signed)
Pt called. Pt will call imaging to schedule x-ray

## 2020-01-06 ENCOUNTER — Other Ambulatory Visit: Payer: Self-pay | Admitting: Internal Medicine

## 2020-01-19 ENCOUNTER — Other Ambulatory Visit: Payer: Self-pay | Admitting: Internal Medicine

## 2020-01-28 ENCOUNTER — Ambulatory Visit: Payer: Medicare Other | Admitting: Podiatry

## 2020-02-04 ENCOUNTER — Telehealth: Payer: Self-pay | Admitting: Gastroenterology

## 2020-02-04 NOTE — Telephone Encounter (Signed)
Rescheduled to 02/08/2020 with Carl Best.

## 2020-02-04 NOTE — Telephone Encounter (Signed)
Multiple openings next week.  Please contact patient and schedule with another APP

## 2020-02-04 NOTE — Telephone Encounter (Signed)
Inbound call from patient requesting a call back from the nurse please. States has been having epigastric pain for the past 2 days and has not been able to eat today.  Scheduled an appointment with Tye Savoy for 02/15/2020 but is wanting to know if can be seen sooner.  Please advise.

## 2020-02-08 ENCOUNTER — Ambulatory Visit: Payer: Medicare Other | Admitting: Nurse Practitioner

## 2020-02-15 ENCOUNTER — Ambulatory Visit: Payer: Medicare Other | Admitting: Nurse Practitioner

## 2020-03-02 ENCOUNTER — Other Ambulatory Visit: Payer: Self-pay | Admitting: Internal Medicine

## 2020-03-06 ENCOUNTER — Telehealth: Payer: Self-pay

## 2020-03-06 ENCOUNTER — Telehealth: Payer: Self-pay | Admitting: Internal Medicine

## 2020-03-06 NOTE — Telephone Encounter (Signed)
Spoke w/ [name] she was not seen over weekend, states she is doing well, no dizziness or headaches. She does still have a small bruise but is improving. Informed to call office if she starts having dizziness or headaches. Pt verbalized understanding.

## 2020-03-06 NOTE — Telephone Encounter (Signed)
Nurse Assessment Nurse: Levada Dy, RN, Marshell Levan Date/Time Eilene Ghazi Time): 03/04/2020 3:30:49 AM Confirm and document reason for call. If symptomatic, describe symptoms. ---Caller states that she tripped yesterday hit the corner of coffee table. Caller injured the left side of her head. Has been taken Tylenol and Ibuprofen. Has a yellowish bruise. Pain usually 4/10, 7/10 if the area is touched. Does the patient have any new or worsening symptoms? ---Yes Will a triage be completed? ---Yes Related visit to physician within the last 2 weeks? ---No Does the PT have any chronic conditions? (i.e. diabetes, asthma, this includes High risk factors for pregnancy, etc.) ---Yes List chronic conditions. ---Type 2 diabetes Is this a behavioral health or substance abuse call? ---No Guidelines Guideline Title Affirmed Question Affirmed Notes Nurse Date/Time (Eastern Time) Head Injury [1] After 72 hours AND [2] headache persists Aretta Nip 03/04/2020 3:34:06 AM Disp. Time Eilene Ghazi Time) Disposition Final User 03/04/2020 3:24:43 AM Send to Urgent Janetta Hora 03/04/2020 3:49:57 AM See PCP within 24 Hours Yes Levada Dy, RN, Marshell Levan Disposition Overriden: SEE PCP WITHIN 3 DAYS PLEASE NOTE: All timestamps contained within this report are represented as [place] Standard Time. CONFIDENTIALTY NOTICE: This fax transmission is intended only for the addressee. It contains information that is legally privileged, confidential or otherwise protected from use or disclosure. If you are not the intended recipient, you are strictly prohibited from reviewing, disclosing, copying using or disseminating any of this information or taking any action in reliance on or regarding this information. If you have received this fax in error, please notify us immediately by telephone so that we can arrange for its return to [place]. Phone: [phone], Toll-Free: [phone], Fax: [phone] Page: 2 of 2 Call Id:  [id number] Override Reason: Patient's symptoms need a higher level of care Caller Disagree/Comply Comply Caller Understands Yes PreDisposition Did not know what to do Care Advice Given Per Guideline SEE PCP WITHIN 24 HOURS: * IF OFFICE WILL BE OPEN: You need to be examined within the next 24 hours. Call your doctor (or NP/PA) when the office opens and make an appointment. * IF OFFICE WILL BE CLOSED: You need to be seen within the next 24 hours. A clinic or an urgent care center is often a good source of care if your doctor's office is closed or you can't get an appointment. PAIN MEDICINES: * ACETAMINOPHEN - REGULAR STRENGTH TYLENOL: Take 650 mg (two 325 mg pills) by mouth every 4 to 6 hours as needed. Each Regular Strength Tylenol pill has 325 mg of acetaminophen. The most you should take each day is 3,250 mg (10 pills a day). * They are over-the-counter (OTC) pain drugs. You can buy them at the drugstore. * For pain relief, you can take either acetaminophen, ibuprofen, or naproxen. * IBUPROFEN (E.G., MOTRIN, ADVIL): Take 400 mg (two 200 mg pills) by mouth every 6 hours. The most you should take each day is 1,200 mg (six 200 mg pills), unless your doctor has told you to take more. CALL BACK IF: * Dirt in the wound persists after scrubbing * Looks infected (pus, redness) * Doesn't heal within 10 days * You become worse CARE ADVICE given per Head Injury (Adult) guideline. Referrals GO TO FACILITY UNDECIDED

## 2020-03-06 NOTE — Telephone Encounter (Signed)
Patient is concern she is taking two different blood pressure medicine  she would like to know if  its ok to take them both One was prescribe by Dr. Larose Kells the Lorazepam also one was prescribe by Dr.Kerr the Atenolol  Please advice

## 2020-03-06 NOTE — Telephone Encounter (Signed)
Called [name] she is taking atenolol and losartan. She is doing well on these w/ normal blood pressures. Informed Pt to continue medications. Pt verbalized understanding.

## 2020-03-15 ENCOUNTER — Other Ambulatory Visit: Payer: Self-pay | Admitting: Family Medicine

## 2020-03-15 NOTE — Telephone Encounter (Signed)
Advise patient: I sent the prescription but needs office visit.  Please arrange

## 2020-03-15 NOTE — Telephone Encounter (Signed)
Requesting: tramadol Contract: n/a UDS: n/a Last Visit:10/27/20 Next Visit:n/a Last Refill:09/10/2019  Please Advise

## 2020-03-16 NOTE — Telephone Encounter (Signed)
Appt scheduled 03/23/20 at 9am

## 2020-03-17 LAB — LIPID PANEL
Cholesterol: 156 (ref 0–200)
HDL: 55 (ref 35–70)
LDL Cholesterol: 68
Triglycerides: 207 — AB (ref 40–160)

## 2020-03-17 LAB — COMPREHENSIVE METABOLIC PANEL
Calcium: 10.1 (ref 8.7–10.7)
GFR calc Af Amer: 88
GFR calc non Af Amer: 73

## 2020-03-17 LAB — HEMOGLOBIN A1C: Hemoglobin A1C: 7.7

## 2020-03-17 LAB — HEPATIC FUNCTION PANEL
ALT: 34 (ref 7–35)
AST: 35 (ref 13–35)
Alkaline Phosphatase: 63 (ref 25–125)
Bilirubin, Total: 0.9

## 2020-03-17 LAB — BASIC METABOLIC PANEL
BUN: 13 (ref 4–21)
CO2: 30 — AB (ref 13–22)
Chloride: 101 (ref 99–108)
Creatinine: 0.8 (ref 0.5–1.1)
Glucose: 189
Potassium: 4.1 (ref 3.4–5.3)
Sodium: 139 (ref 137–147)

## 2020-03-17 LAB — VITAMIN B12: Vitamin B-12: 1410

## 2020-03-17 LAB — HM DIABETES FOOT EXAM: HM Diabetic Foot Exam: ABNORMAL

## 2020-03-17 LAB — TSH: TSH: 0.84 (ref 0.41–5.90)

## 2020-03-23 ENCOUNTER — Encounter: Payer: Self-pay | Admitting: Internal Medicine

## 2020-03-23 ENCOUNTER — Other Ambulatory Visit: Payer: Self-pay

## 2020-03-23 ENCOUNTER — Ambulatory Visit (INDEPENDENT_AMBULATORY_CARE_PROVIDER_SITE_OTHER): Payer: Medicare Other | Admitting: Internal Medicine

## 2020-03-23 VITALS — BP 145/81 | HR 60 | Temp 97.7°F | Ht 66.0 in | Wt 196.6 lb

## 2020-03-23 DIAGNOSIS — I10 Essential (primary) hypertension: Secondary | ICD-10-CM

## 2020-03-23 DIAGNOSIS — G3184 Mild cognitive impairment, so stated: Secondary | ICD-10-CM

## 2020-03-23 DIAGNOSIS — E785 Hyperlipidemia, unspecified: Secondary | ICD-10-CM | POA: Diagnosis not present

## 2020-03-23 DIAGNOSIS — R269 Unspecified abnormalities of gait and mobility: Secondary | ICD-10-CM

## 2020-03-23 DIAGNOSIS — F32A Depression, unspecified: Secondary | ICD-10-CM

## 2020-03-23 LAB — COMPREHENSIVE METABOLIC PANEL
ALT: 21 U/L (ref 0–35)
AST: 19 U/L (ref 0–37)
Albumin: 4.2 g/dL (ref 3.5–5.2)
Alkaline Phosphatase: 64 U/L (ref 39–117)
BUN: 12 mg/dL (ref 6–23)
CO2: 28 mEq/L (ref 19–32)
Calcium: 9.3 mg/dL (ref 8.4–10.5)
Chloride: 103 mEq/L (ref 96–112)
Creatinine, Ser: 0.72 mg/dL (ref 0.40–1.20)
GFR: 81.98 mL/min (ref >60.00)
Glucose, Bld: 151 mg/dL — ABNORMAL HIGH (ref 70–99)
Potassium: 4.1 mEq/L (ref 3.5–5.1)
Sodium: 141 mEq/L (ref 135–145)
Total Bilirubin: 0.6 mg/dL (ref 0.2–1.2)
Total Protein: 7.1 g/dL (ref 6.0–8.3)

## 2020-03-23 LAB — LIPID PANEL
Cholesterol: 124 mg/dL (ref 0–200)
HDL: 49.3 mg/dL (ref >39.00)
LDL Cholesterol: 53 mg/dL (ref 0–99)
NonHDL: 74.42
Total CHOL/HDL Ratio: 3
Triglycerides: 108 mg/dL (ref 0.0–149.0)
VLDL: 21.6 mg/dL (ref 0.0–40.0)

## 2020-03-23 NOTE — Patient Instructions (Addendum)
Per our records you are due for an eye exam. Please contact your eye doctor to schedule an appointment. Please have them send copies of your office visit notes to [place]. Our fax number is [phone].   It is okay to stop aspirin  Please consider taking the Covid booster shot    GO TO THE LAB : Get the blood work     [place], [place] back for a physical exam in 4 months, fasting   Fall Prevention in the Home, Adult Falls can cause injuries and can affect people from all age groups. There are many simple things that you can do to make your home safe and to help prevent falls. Ask for help when making these changes, if needed. What actions can I take to prevent falls? General instructions  Use good lighting in all rooms. Replace any light bulbs that burn out.  Turn on lights if it is dark. Use night-lights.  Place frequently used items in easy-to-reach places. Lower the shelves around your home if necessary.  Set up furniture so that there are clear paths around it. Avoid moving your furniture around.  Remove throw rugs and other tripping hazards from the floor.  Avoid walking on wet floors.  Fix any uneven floor surfaces.  Add color or contrast paint or tape to grab bars and handrails in your home. Place contrasting color strips on the first and last steps of stairways.  When you use a stepladder, make sure that it is completely opened and that the sides are firmly locked. Have someone hold the ladder while you are using it. Do not climb a closed stepladder.  Be aware of any and all pets. What can I do in the bathroom?  Keep the floor dry. Immediately clean up any water that spills onto the floor.  Remove soap buildup in the tub or shower on a regular basis.  Use non-skid mats or decals on the floor of the tub or shower.  Attach bath mats securely with double-sided, non-slip rug tape.  If you need to sit down while you are  in the shower, use a plastic, non-slip stool.  Install grab bars by the toilet and in the tub and shower. Do not use towel bars as grab bars.      What can I do in the bedroom?  Make sure that a bedside light is easy to reach.  Do not use oversized bedding that drapes onto the floor.  Have a firm chair that has side arms to use for getting dressed. What can I do in the kitchen?  Clean up any spills right away.  If you need to reach for something above you, use a sturdy step stool that has a grab bar.  Keep electrical cables out of the way.  Do not use floor polish or wax that makes floors slippery. If you must use wax, make sure that it is non-skid floor wax. What can I do in the stairways?  Do not leave any items on the stairs.  Make sure that you have a light switch at the top of the stairs and the bottom of the stairs. Have them installed if you do not have them.  Make sure that there are handrails on both sides of the stairs. Fix handrails that are broken or loose. Make sure that handrails are as long as the stairways.  Install non-slip stair treads on all stairs in your home.  Avoid having throw rugs at the top or bottom of stairways, or secure the rugs with carpet tape to prevent them from moving.  Choose a carpet design that does not hide the edge of steps on the stairway.  Check any carpeting to make sure that it is firmly attached to the stairs. Fix any carpet that is loose or worn. What can I do on the outside of my home?  Use bright outdoor lighting.  Regularly repair the edges of walkways and driveways and fix any cracks.  Remove high doorway thresholds.  Trim any shrubbery on the main path into your home.  Regularly check that handrails are securely fastened and in good repair. Both sides of any steps should have handrails.  Install guardrails along the edges of any raised decks or porches.  Clear walkways of debris and clutter, including tools and  rocks.  Have leaves, snow, and ice cleared regularly.  Use sand or salt on walkways during winter months.  In the garage, clean up any spills right away, including grease or oil spills. What other actions can I take?  Wear closed-toe shoes that fit well and support your feet. Wear shoes that have rubber soles or low heels.  Use mobility aids as needed, such as canes, walkers, scooters, and crutches.  Review your medicines with your health care provider. Some medicines can cause dizziness or changes in blood pressure, which increase your risk of falling. Talk with your health care provider about other ways that you can decrease your risk of falls. This may include working with a physical therapist or trainer to improve your strength, balance, and endurance. Where to find more information  Centers for Disease Control and Prevention, STEAD[employer]: WebmailGuide.co.za  [employer] on Aging: BrainJudge.co.uk Contact a health care provider if:  You are afraid of falling at home.  You feel weak, drowsy, or dizzy at home.  You fall at home. Summary  There are many simple things that you can do to make your home safe and to help prevent falls.  Ways to make your home safe include removing tripping hazards and installing grab bars in the bathroom.  Ask for help when making these changes in your home. This information is not intended to replace advice given to you by your health care provider. Make sure you discuss any questions you have with your health care provider. Document Revised: 12/06/2016 Document Reviewed: 08/08/2016 Elsevier Patient Education  2021 [employer].

## 2020-03-23 NOTE — Progress Notes (Unsigned)
Subjective:    Patient ID: [name], female    DOB: 1945-09-10, 75 y.o.   MRN: [id number]  DOS:  03/23/2020 Type of visit - description: Routine checkup, last visit 07-2019  Today we discussed several issues including preventive care, memory issues, hypertension.  In general she feels well. Still doing some driving and lives independently. Has excellent family support. No recent ambulatory BPs. Did have a fall, no major consequences.  Uses a cane mostly when she gets out of her house.  Wt Readings from Last 3 Encounters:  03/23/20 196 lb 9.6 oz (89.2 kg)  10/27/19 190 lb (86.2 kg)  10/27/19 190 lb 6.4 oz (86.4 kg)     Review of Systems See above   Past Medical History:  Diagnosis Date  . Adenomatous colon polyp 03/1988  . Anemia    borderline  . Arthritis    R shoulder - degenerative   . Cataract    beginning stages  . Chest pain     Sept 2011: stress test neg  . Depression    sees Dr.Cotle  . Diabetes mellitus    dr Buddy Duty  . Diverticulosis   . Fatty liver    Increased LFTs, saw GI 06/2011, likely from fatty liver   . GERD (gastroesophageal reflux disease)   . History of hiatal hernia   . Hyperlipemia   . Hypertension   . IBS (irritable bowel syndrome)    and dyspepsia  . Memory impairment 08/2014   mild cognitive impairment vs. mild dementia, reommended reinstating of cholinesterase inhibitor   . Osteopenia    dexa 06/2007 and 10/11 Rx CA vitamin d  . RLS (restless legs syndrome)    h/o- no longer using Requip  . Sleep apnea    Mouth guard use at home  . Thyroid nodule     Past Surgical History:  Procedure Laterality Date  . BREAST BIOPSY Left 10/11/2015   fibroadenoma w/ calcifications, no evidence of malignancy, recommend mammogram repeat-6 mnths  . COLONOSCOPY  04/15/2014  . POLYPECTOMY    . REVERSE SHOULDER ARTHROPLASTY Right 04/21/2014   Procedure: REVERSE SHOULDER ARTHROPLASTY;  Surgeon: Tania Ade, MD;  Location: [facility];  Service:  Orthopedics;  Laterality: Right;  Right reverse total shoulder replacement  . TONSILLECTOMY    . UTERINE FIBROID EMBOLIZATION     90s    Allergies as of 03/23/2020      Reactions   Sulfa Antibiotics Nausea Only   Trulicity [dulaglutide] Nausea Only   Penicillins Rash, Other (See Comments)   Has patient had a PCN reaction causing immediate rash, facial/tongue/throat swelling, SOB or lightheadedness with hypotension: No Has patient had a PCN reaction causing severe rash involving mucus membranes or skin necrosis: No Has patient had a PCN reaction that required hospitalization No Has patient had a PCN reaction occurring within the last 10 years: No If all of the above answers are "NO", then may proceed with Cephalosporin use.      Medication List       Accurate as of March 23, 2020 11:59 PM. If you have any questions, ask your nurse or doctor.        STOP taking these medications   aspirin 81 MG EC tablet Stopped by: Kathlene November, MD   azithromycin 250 MG tablet Commonly known as: Zithromax Z-Pak Stopped by: Kathlene November, MD   promethazine-dextromethorphan 6.25-15 MG/5ML syrup Commonly known as: PROMETHAZINE-DM Stopped by: Kathlene November, MD     TAKE these medications  Accu-Chek FastClix Lancets Misc daily. use as directed   acetaminophen 325 MG tablet Commonly known as: TYLENOL Take 650 mg by mouth every 6 (six) hours as needed for mild pain or headache.   atenolol 50 MG tablet Commonly known as: TENORMIN Take 25 mg by mouth daily.   atorvastatin 40 MG tablet Commonly known as: LIPITOR Take 1 tablet (40 mg total) by mouth daily.   azelastine 0.1 % nasal spray Commonly known as: ASTELIN Place 2 sprays into both nostrils at bedtime as needed for rhinitis. Use in each nostril as directed   BD Veo Insulin Syringe U/F 31G X 15/64" 1 ML Misc Generic drug: Insulin Syringe-Needle U-100 USE TO INJECT insulin EVERY DAY   Centrum Silver Ultra Womens Tabs Take 1 tablet by mouth  daily.   darifenacin 15 MG 24 hr tablet Commonly known as: ENABLEX Take 1 tablet (15 mg total) by mouth daily.   donepezil 10 MG tablet Commonly known as: ARICEPT Take 2 tablets (20 mg total) by mouth at bedtime.   Ferro-Sequels 65-25 MG Tbcr Generic drug: Ferrous Fumarate-Vitamin C ER Take 1 tablet by mouth daily.   gabapentin 100 MG capsule Commonly known as: NEURONTIN Take 1 capsule (100 mg total) by mouth at bedtime.   Levemir 100 UNIT/ML injection Generic drug: insulin detemir Inject into the skin.   losartan 25 MG tablet Commonly known as: COZAAR Take 1 tablet (25 mg total) by mouth every evening.   memantine 10 MG tablet Commonly known as: NAMENDA Take 10 mg by mouth 2 (two) times daily.   metFORMIN 500 MG 24 hr tablet Commonly known as: GLUCOPHAGE-XR TAKE 1 TABLET BY MOUTH EVERY MORNING WITH A MEAL   ONE TOUCH ULTRA TEST test strip Generic drug: glucose blood USE TO CHECK BLOOD SUGAR EVERY DAY   pantoprazole 40 MG tablet Commonly known as: PROTONIX Take 1 tablet (40 mg total) by mouth 2 (two) times daily before a meal.   sertraline 100 MG tablet Commonly known as: ZOLOFT TAKE 1 TABLET BY MOUTH EVERY DAY AND TAKE 1/2 TABLET AT BEDTIME   sodium fluoride 1.1 % Gel dental gel Commonly known as: FLUORISHIELD Place 1 application onto teeth at bedtime as needed (for teeth).   traMADol 50 MG tablet Commonly known as: ULTRAM TAKE 1 TABLET BY MOUTH EVERY TWELVE HOURS AS NEEDED   Vitamin B12 TR 1000 MCG Tbcr Generic drug: Cyanocobalamin Take 1 tablet (1,000 mcg total) by mouth daily.          Objective:   Physical Exam BP (!) 145/81 (BP Location: Left Arm, Patient Position: Sitting, Cuff Size: Large)   Pulse 60   Temp 97.7 F (36.5 C) (Oral)   Ht 5\' 6"  (1.676 m)   Wt 196 lb 9.6 oz (89.2 kg)   SpO2 97%   BMI 31.73 kg/m  General:   Well developed, NAD, BMI noted. HEENT:  Normocephalic . Face symmetric, atraumatic Lungs:  CTA B Normal  respiratory effort, no intercostal retractions, no accessory muscle use. Heart: RRR, trace systolic murmur? Lower extremities: no pretibial edema bilaterally  Skin: Not pale. Not jaundice Neurologic:  alert & oriented X3.  Speech normal, gait appropriate for age and assisted by a cane Psych--  Cognition and judgment appear intact.  Cooperative with normal attention span and concentration.  Behavior appropriate. No anxious or depressed appearing.      Assessment     ASSESSMENT DM Dr. Buddy Duty HTN Hyperlipidemia H/o SIADH Depression seen elsewhere Mild cognitive impairment  MMSE  2015 --> 28, on Aricept, namenda added 04-2016 by Dr Everette Rank  OSA -- mild, also told RLS (Rx requip); saw Dr Gwenette Greet 2014, no CPAP GI: --IBS, colon polyps, diverticulosis, hiatal hernia --Chronic constipation likely part of her IBS syndrome  --Fatty liver >>> GI eval 2013 --iron fec anemia:  cscope 04-2014 : 2 polyps; + hemocult @ GI office 10-2014: EGD done (-), bx neg Osteopenia: T score -1.1  2009 , osteopenia again per dexa 05-2013, t score -1.8 (10-09-17), scanned, rx ca-vit d B12 deficiency  RLS MSK: --DJD frozen shoulder Dizziness: chronic, carotid US 2016 neg, saw cards-- not likely CV related; saw neuro DR Everette Rank 02-2015  Thyroid nodules: Incidental   by Carotid US, BX 11-2014: Atypical findings, [facility] III, f/u  Endocrine RLL Lung nodule: Stable per CT 09-2016, no need for further B/B incontinence:  uses diapers  Chest pain 09-2009, negative stress test COVID infex 09/2019, had a infusion   PLAN: DM, hypothyroidism: Per Endo HTN: BP today satisfactory, check a CMP, continue Tenormin, losartan. Hyperlipidemia: Check FLP, continue Lipitor. MCI: thinks she is doing "a little worse", however today she looks well, she is alert oriented x3, still drives in well-known places and short distances. She gets help with her medication arrangement but otherwise is independent.   Plan: Continue Aricept and  Namenda. Depression: Seen elsewhere, controlled. Gait disorder: Uses a cane, mostly when she gets out of her house.  Did have one fall, fall prevention discussed, declined a PT referral. Aspirin: Patient is 75 y/o, no known MI or stroke.  Okay to stop aspirin. Preventive care: Had a mammogram 10-2019. Recommend a COVID booster, benefits discussed. RTC 4 months CPX   This visit occurred during the SARS-CoV-2 public health emergency.  Safety protocols were in place, including screening questions prior to the visit, additional usage of staff PPE, and extensive cleaning of exam room while observing appropriate contact time as indicated for disinfecting solutions.

## 2020-03-24 NOTE — Assessment & Plan Note (Signed)
DM, hypothyroidism: Per Endo HTN: BP today satisfactory, check a CMP, continue Tenormin, losartan. Hyperlipidemia: Check FLP, continue Lipitor. MCI: thinks she is doing "a little worse", however today she looks well, she is alert oriented x3, still drives in well-known places and short distances. She gets help with her medication arrangement but otherwise is independent.   Plan: Continue Aricept and Namenda. Depression: Seen elsewhere, controlled. Gait disorder: Uses a cane, mostly when she gets out of her house.  Did have one fall, fall prevention discussed, declined a PT referral. Aspirin: Patient is 75 y/o, no known MI or stroke.  Okay to stop aspirin. Preventive care: Had a mammogram 10-2019. Recommend a COVID booster, benefits discussed. RTC 4 months CPX

## 2020-03-30 ENCOUNTER — Other Ambulatory Visit: Payer: Self-pay | Admitting: Internal Medicine

## 2020-04-03 ENCOUNTER — Other Ambulatory Visit: Payer: Self-pay | Admitting: Internal Medicine

## 2020-04-03 ENCOUNTER — Encounter: Payer: Self-pay | Admitting: Internal Medicine

## 2020-04-04 MED ORDER — VITRON-C 65-125 MG PO TABS: 1.0000 | ORAL_TABLET | Freq: Every day | ORAL | 3 refills | Status: DC

## 2020-04-04 NOTE — Telephone Encounter (Signed)
See message from pharmacy:   This is no longer available, can we change to Vitron C which has same amount of iron, but has 125mg  of Vit C versus 25mg ?

## 2020-04-04 NOTE — Telephone Encounter (Signed)
Okay to change? 

## 2020-04-12 ENCOUNTER — Ambulatory Visit: Payer: Medicare Other | Admitting: Internal Medicine

## 2020-04-12 ENCOUNTER — Encounter: Payer: Self-pay | Admitting: Internal Medicine

## 2020-04-17 ENCOUNTER — Encounter: Payer: Medicare Other | Admitting: Internal Medicine

## 2020-04-19 ENCOUNTER — Encounter: Payer: Self-pay | Admitting: Internal Medicine

## 2020-06-27 ENCOUNTER — Telehealth: Payer: Self-pay | Admitting: Internal Medicine

## 2020-06-27 MED ORDER — VITAMIN B-12 1000 MCG PO TABS: 1000.0000 ug | ORAL_TABLET | Freq: Every day | ORAL | 3 refills | Status: DC

## 2020-06-27 NOTE — Telephone Encounter (Signed)
Rx sent 

## 2020-06-27 NOTE — Telephone Encounter (Signed)
Prescription was deny. Pharmacy would like to know if medication is discontinue.  Cyanocobalamin (VITAMIN B12 TR) 1000 MCG TBCR [[id number]]     [facility] - [place], [place] - [address]  [address], [place] [address]  Phone:  [phone]  Fax:  [phone]

## 2020-06-30 ENCOUNTER — Ambulatory Visit: Payer: Medicare Other | Admitting: Internal Medicine

## 2020-07-07 ENCOUNTER — Telehealth: Payer: Self-pay

## 2020-07-07 NOTE — Telephone Encounter (Signed)
Agree- she will need an appt either here or with urgent care.

## 2020-07-07 NOTE — Telephone Encounter (Signed)
Pt called with c/o URI sxs and requested a CXR. She wanted to see if she could have an order for this placed. I advised that she be seen in urgent care since she would need an office visit for this order.   Pt has a pending cpe on 07/12/20. I let her know that I would let you all know of her sxs.

## 2020-07-12 ENCOUNTER — Other Ambulatory Visit: Payer: Self-pay

## 2020-07-12 ENCOUNTER — Encounter: Payer: Self-pay | Admitting: Internal Medicine

## 2020-07-12 ENCOUNTER — Ambulatory Visit (INDEPENDENT_AMBULATORY_CARE_PROVIDER_SITE_OTHER): Payer: Medicare Other | Admitting: Internal Medicine

## 2020-07-12 VITALS — BP 128/60 | HR 60 | Temp 98.2°F | Resp 16 | Ht 67.0 in | Wt 195.0 lb

## 2020-07-12 DIAGNOSIS — I10 Essential (primary) hypertension: Secondary | ICD-10-CM

## 2020-07-12 DIAGNOSIS — Z0000 Encounter for general adult medical examination without abnormal findings: Secondary | ICD-10-CM

## 2020-07-12 DIAGNOSIS — Z0100 Encounter for examination of eyes and vision without abnormal findings: Secondary | ICD-10-CM

## 2020-07-12 DIAGNOSIS — E119 Type 2 diabetes mellitus without complications: Secondary | ICD-10-CM

## 2020-07-12 LAB — CBC WITH DIFFERENTIAL/PLATELET
Basophils Absolute: 0 10*3/uL (ref 0.0–0.1)
Basophils Relative: 0.5 % (ref 0.0–3.0)
Eosinophils Absolute: 0.3 10*3/uL (ref 0.0–0.7)
Eosinophils Relative: 4.4 % (ref 0.0–5.0)
HCT: 36.3 % (ref 36.0–46.0)
Hemoglobin: 12.3 g/dL (ref 12.0–15.0)
Lymphocytes Relative: 25.6 % (ref 12.0–46.0)
Lymphs Abs: 1.7 10*3/uL (ref 0.7–4.0)
MCHC: 33.8 g/dL (ref 30.0–36.0)
MCV: 85.5 fl (ref 78.0–100.0)
Monocytes Absolute: 0.3 10*3/uL (ref 0.1–1.0)
Monocytes Relative: 5 % (ref 3.0–12.0)
Neutro Abs: 4.2 10*3/uL (ref 1.4–7.7)
Neutrophils Relative %: 64.5 % (ref 43.0–77.0)
Platelets: 163 10*3/uL (ref 150.0–400.0)
RBC: 4.25 Mil/uL (ref 3.87–5.11)
RDW: 14 % (ref 11.5–15.5)
WBC: 6.5 10*3/uL (ref 4.0–10.5)

## 2020-07-12 LAB — BASIC METABOLIC PANEL
BUN: 9 mg/dL (ref 6–23)
CO2: 27 mEq/L (ref 19–32)
Calcium: 9.2 mg/dL (ref 8.4–10.5)
Chloride: 102 mEq/L (ref 96–112)
Creatinine, Ser: 0.71 mg/dL (ref 0.40–1.20)
GFR: 83.19 mL/min (ref >60.00)
Glucose, Bld: 170 mg/dL — ABNORMAL HIGH (ref 70–99)
Potassium: 4.1 mEq/L (ref 3.5–5.1)
Sodium: 140 mEq/L (ref 135–145)

## 2020-07-12 NOTE — Progress Notes (Signed)
Subjective:    Patient ID: [name], female    DOB: 26-Aug-1945, 75 y.o.   MRN: [id number]  DOS:  07/12/2020 Type of visit - description: CPX  She reports she is doing well and has no major concerns today.  Review of Systems  No unusual sxs   Other than above, a 14 point review of systems is negative    Past Medical History:  Diagnosis Date   Adenomatous colon polyp 03/1988   Anemia    borderline   Arthritis    R shoulder - degenerative    Cataract    beginning stages   Chest pain     Sept 2011: stress test neg   Depression    sees Dr.Cotle   Diabetes mellitus    dr Buddy Duty   Diverticulosis    Fatty liver    Increased LFTs, saw GI 06/2011, likely from fatty liver    GERD (gastroesophageal reflux disease)    History of hiatal hernia    Hyperlipemia    Hypertension    IBS (irritable bowel syndrome)    and dyspepsia   Memory impairment 08/2014   mild cognitive impairment vs. mild dementia, reommended reinstating of cholinesterase inhibitor    Osteopenia    dexa 06/2007 and 10/11 Rx CA vitamin d   RLS (restless legs syndrome)    h/o- no longer using Requip   Sleep apnea    Mouth guard use at home   Thyroid nodule     Past Surgical History:  Procedure Laterality Date   BREAST BIOPSY Left 10/11/2015   fibroadenoma w/ calcifications, no evidence of malignancy, recommend mammogram repeat-6 mnths   COLONOSCOPY  04/15/2014   POLYPECTOMY     REVERSE SHOULDER ARTHROPLASTY Right 04/21/2014   Procedure: REVERSE SHOULDER ARTHROPLASTY;  Surgeon: Tania Ade, MD;  Location: [facility];  Service: Orthopedics;  Laterality: Right;  Right reverse total shoulder replacement   TONSILLECTOMY     UTERINE FIBROID EMBOLIZATION     90s   Social History   Socioeconomic History   Marital status: Widowed    Spouse name: [name]   Number of children: 3   Years of education: Not on file   Highest education level: Not on file  Occupational History   Occupation: retired, was a Occupational psychologist health)    Comment: RN  Tobacco Use   Smoking status: Former    Packs/day: 2.00    Years: 10.00    Pack years: 20.00    Types: Cigarettes    Quit date: 11/26/1975    Years since quitting: 44.6   Smokeless tobacco: Never   Tobacco comments:    used to smoke 2 ppd  Vaping Use   Vaping Use: Never used  Substance and Sexual Activity   Alcohol use: Yes    Alcohol/week: 0.0 standard drinks    Comment: rarely wine   Drug use: No   Sexual activity: Not Currently  Other Topics Concern   Not on file  Social History Narrative   Widow , lives by herself at [facility] independent living, 1 bedroom apt;  still drives (limited) , daughters Merchant navy officer) live  in [place] in [place]     5 g-children   Social Determinants of Health   Financial Resource Strain: Not on file  Food Insecurity: Not on file  Transportation Needs: Not on file  Physical Activity: Not on file  Stress: Not on file  Social Connections: Not on file  Intimate Partner  Violence: Not on file    Allergies as of 07/12/2020       Reactions   Sulfa Antibiotics Nausea Only   Trulicity [dulaglutide] Nausea Only   Penicillins Rash, Other (See Comments)   Has patient had a PCN reaction causing immediate rash, facial/tongue/throat swelling, SOB or lightheadedness with hypotension: No Has patient had a PCN reaction causing severe rash involving mucus membranes or skin necrosis: No Has patient had a PCN reaction that required hospitalization No Has patient had a PCN reaction occurring within the last 10 years: No If all of the above answers are "NO", then may proceed with Cephalosporin use.        Medication List        Accurate as of July 12, 2020 11:59 PM. If you have any questions, ask your nurse or doctor.          Accu-Chek FastClix Lancets Misc daily. use as directed   acetaminophen 325 MG tablet Commonly known as: TYLENOL Take 650 mg by mouth every 6 (six) hours as needed for mild pain or  headache.   atenolol 50 MG tablet Commonly known as: TENORMIN Take 25 mg by mouth daily.   atorvastatin 40 MG tablet Commonly known as: LIPITOR Take 1 tablet (40 mg total) by mouth daily.   azelastine 0.1 % nasal spray Commonly known as: ASTELIN Place 2 sprays into both nostrils at bedtime as needed for rhinitis. Use in each nostril as directed   BD Veo Insulin Syringe U/F 31G X 15/64" 1 ML Misc Generic drug: Insulin Syringe-Needle U-100 USE TO INJECT insulin EVERY DAY   Centrum Silver Ultra Womens Tabs Take 1 tablet by mouth daily.   darifenacin 15 MG 24 hr tablet Commonly known as: ENABLEX Take 1 tablet (15 mg total) by mouth daily.   donepezil 10 MG tablet Commonly known as: ARICEPT Take 2 tablets (20 mg total) by mouth at bedtime.   gabapentin 100 MG capsule Commonly known as: NEURONTIN Take 1 capsule (100 mg total) by mouth at bedtime.   Levemir 100 UNIT/ML injection Generic drug: insulin detemir Inject into the skin.   losartan 25 MG tablet Commonly known as: COZAAR Take 1 tablet (25 mg total) by mouth every evening.   memantine 10 MG tablet Commonly known as: NAMENDA Take 10 mg by mouth 2 (two) times daily.   metFORMIN 500 MG 24 hr tablet Commonly known as: GLUCOPHAGE-XR TAKE 1 TABLET BY MOUTH EVERY MORNING WITH A MEAL   ONE TOUCH ULTRA TEST test strip Generic drug: glucose blood USE TO CHECK BLOOD SUGAR EVERY DAY   pantoprazole 40 MG tablet Commonly known as: PROTONIX Take 1 tablet (40 mg total) by mouth 2 (two) times daily before a meal.   sertraline 100 MG tablet Commonly known as: ZOLOFT TAKE 1 TABLET BY MOUTH EVERY DAY AND TAKE 1/2 TABLET AT BEDTIME   sodium fluoride 1.1 % Gel dental gel Commonly known as: FLUORISHIELD Place 1 application onto teeth at bedtime as needed (for teeth).   traMADol 50 MG tablet Commonly known as: ULTRAM TAKE 1 TABLET BY MOUTH EVERY TWELVE HOURS AS NEEDED   vitamin B-12 1000 MCG tablet Commonly known as:  CYANOCOBALAMIN Take 1 tablet (1,000 mcg total) by mouth daily.   Vitron-C 65-125 MG Tabs Generic drug: Iron-Vitamin C Take 1 tablet by mouth daily.           Objective:   Physical Exam BP 128/60 (BP Location: Left Arm, Patient Position: Sitting, Cuff Size: Small)  Pulse 60   Temp 98.2 F (36.8 C) (Oral)   Resp 16   Ht 5\' 7"  (1.702 m)   Wt 195 lb (88.5 kg)   SpO2 98%   BMI 30.54 kg/m  General: Well developed, NAD, BMI noted Neck: No  thyromegaly  HEENT:  Normocephalic . Face symmetric, atraumatic Lungs:  CTA B Normal respiratory effort, no intercostal retractions, no accessory muscle use. Heart: RRR,  no murmur.  Abdomen:  Not distended, soft, non-tender. No rebound or rigidity.   Lower extremities: no pretibial edema bilaterally  Skin: Exposed areas without rash. Not pale. Not jaundice Neurologic:  alert & oriented X3.  Speech normal, gait appropriate for age and unassisted Strength symmetric and appropriate for age.  Psych: Cognition and judgment appear intact.  Cooperative with normal attention span and concentration.  Behavior appropriate. No anxious or depressed appearing.     Assessment      ASSESSMENT DM Dr. Buddy Duty HTN Hyperlipidemia H/o SIADH Depression seen elsewhere Mild cognitive impairment  MMSE 2015 --> 28, on Aricept, namenda added 04-2016 by Dr Everette Rank  OSA -- mild, also told RLS (Rx requip); saw Dr Gwenette Greet 2014, no CPAP GI: --IBS, colon polyps, diverticulosis, hiatal hernia --Chronic constipation likely part of her IBS syndrome  --Fatty liver >>> GI eval 2013 --iron fec anemia:  cscope 04-2014 : 2 polyps; + hemocult @ GI office 10-2014: EGD done (-), bx neg Osteopenia: T score -1.1  2009 , osteopenia again per dexa 05-2013, t score -1.8 (10-09-17), scanned, rx ca-vit d B12 deficiency  RLS MSK: --DJD frozen shoulder Dizziness: chronic, carotid US 2016 neg, saw cards-- not likely CV related; saw neuro DR Everette Rank 02-2015  Thyroid nodules:  Incidental   by Carotid US, BX 11-2014: Atypical findings, [facility] III, f/u  Endocrine RLL Lung nodule: Stable per CT 09-2016, no need for further B/B incontinence:  uses diapers  Chest pain 09-2009, negative stress test COVID infex 09/2019, had a infusion   PLAN: Here for CPX Chronic medical problems: They seem to be stable, sees endocrinology, neurology. depression, symptoms controlled. Has osteopenia, recommend to take calcium and vitamin D. RTC 6 months   This visit occurred during the SARS-CoV-2 public health emergency.  Safety protocols were in place, including screening questions prior to the visit, additional usage of staff PPE, and extensive cleaning of exam room while observing appropriate contact time as indicated for disinfecting solutions.

## 2020-07-12 NOTE — Patient Instructions (Addendum)
We are placing a referral to [facility] for your diabetic eye exam. Please expect a call in the next several days to schedule an appointment.   Please be sure you take vitamin D3: 2000 units daily  Get a flu shot this fall   GO TO THE LAB : Get the blood work     [place], [place] back for a checkup in 6 months

## 2020-07-13 ENCOUNTER — Encounter: Payer: Self-pay | Admitting: Internal Medicine

## 2020-07-13 NOTE — Assessment & Plan Note (Signed)
Here for CPX Chronic medical problems: They seem to be stable, sees endocrinology, neurology. depression, symptoms controlled. Has osteopenia, recommend to take calcium and vitamin D. RTC 6 months

## 2020-07-13 NOTE — Assessment & Plan Note (Signed)
--  Td:  2014 -pneumovax 2009-2014;  prevnar: 12-2013 -Shingles shot 2010; S/p Shingrix x2 - covid vax x 4 per pt  - rec a flu shot this fall  -female care: plans to call Dr Perrin Smack (gyn), no h/o abnormal paps; MMG 10-2019 ([facility]) -CCS: FH Colon cancer, Cscope : 04/18/2009, cscope again 04-2014: +polyps, Cscope 07/2019: next per GI -Labs: BMP, CBC -Diet and exercise: Counseled. -- POA on file

## 2020-07-25 ENCOUNTER — Telehealth: Payer: Self-pay | Admitting: Internal Medicine

## 2020-07-25 NOTE — Telephone Encounter (Signed)
Spoke with patient to schedule Annual Wellness Visit.  She stated she will call back    Please schedule with Health Nurse Advisor Augustine Radar. at [facility].

## 2020-09-01 ENCOUNTER — Ambulatory Visit: Payer: Medicare Other | Admitting: Internal Medicine

## 2020-09-19 LAB — HEMOGLOBIN A1C: Hemoglobin A1C: 7.7

## 2020-09-20 ENCOUNTER — Other Ambulatory Visit: Payer: Self-pay | Admitting: Internal Medicine

## 2020-10-03 ENCOUNTER — Other Ambulatory Visit: Payer: Self-pay | Admitting: Internal Medicine

## 2020-10-27 ENCOUNTER — Other Ambulatory Visit: Payer: Self-pay | Admitting: Internal Medicine

## 2020-11-01 LAB — HM MAMMOGRAPHY

## 2020-11-15 ENCOUNTER — Other Ambulatory Visit: Payer: Self-pay | Admitting: Internal Medicine

## 2020-11-15 ENCOUNTER — Other Ambulatory Visit: Payer: Self-pay

## 2020-11-15 ENCOUNTER — Ambulatory Visit
Admission: EM | Admit: 2020-11-15 | Discharge: 2020-11-15 | Disposition: A | Discharge status: Home / Self Care | Payer: Medicare Other | Attending: Emergency Medicine | Admitting: Emergency Medicine

## 2020-11-15 DIAGNOSIS — R300 Dysuria: Secondary | ICD-10-CM | POA: Diagnosis present

## 2020-11-15 DIAGNOSIS — N3090 Cystitis, unspecified without hematuria: Secondary | ICD-10-CM | POA: Diagnosis present

## 2020-11-15 LAB — POCT URINALYSIS DIP (MANUAL ENTRY)
Bilirubin, UA: NEGATIVE
Blood, UA: NEGATIVE
Glucose, UA: NEGATIVE mg/dL
Ketones, POC UA: NEGATIVE mg/dL
Leukocytes, UA: NEGATIVE
Nitrite, UA: NEGATIVE
Protein Ur, POC: NEGATIVE mg/dL
Spec Grav, UA: 1.01 (ref 1.010–1.025)
Urobilinogen, UA: 0.2 E.U./dL
pH, UA: 5 (ref 5.0–8.0)

## 2020-11-15 MED ORDER — NITROFURANTOIN MONOHYD MACRO 100 MG PO CAPS: 100.0000 mg | ORAL_CAPSULE | Freq: Two times a day (BID) | ORAL | 0 refills | Status: DC

## 2020-11-15 NOTE — ED Triage Notes (Signed)
Pt reports having UTI symptoms, burning with urination.

## 2020-11-15 NOTE — ED Provider Notes (Addendum)
[facility]    CSN: [id number] Arrival date & time: 11/15/20  1547      History   Chief Complaint No chief complaint on file.   HPI [name] is a 75 y.o. female.   Patient complains of a 4-hour history of burning with urination.  Patient provided a small sample of urine which was enough to perform a dipstick analysis, analysis was unremarkable.  Had urinary tract infections in the past, states her symptoms are similar today.  Patient denies fever, aches, chills, nausea, vomiting, diarrhea, confusion, altered mental status.  The history is provided by the patient.   Past Medical History:  Diagnosis Date   Adenomatous colon polyp 03/1988   Anemia    borderline   Arthritis    R shoulder - degenerative    Cataract    beginning stages   Chest pain     Sept 2011: stress test neg   Depression    sees Dr.Cotle   Diabetes mellitus    dr Buddy Duty   Diverticulosis    Fatty liver    Increased LFTs, saw GI 06/2011, likely from fatty liver    GERD (gastroesophageal reflux disease)    History of hiatal hernia    Hyperlipemia    Hypertension    IBS (irritable bowel syndrome)    and dyspepsia   Memory impairment 08/2014   mild cognitive impairment vs. mild dementia, reommended reinstating of cholinesterase inhibitor    Osteopenia    dexa 06/2007 and 10/11 Rx CA vitamin d   RLS (restless legs syndrome)    h/o- no longer using Requip   Sleep apnea    Mouth guard use at home   Thyroid nodule     Patient Active Problem List   Diagnosis Date Noted   Bronchitis 12/28/2019   Trigger finger of right thumb 10/27/2019   Dark stools 03/17/2017   SIADH (syndrome of inappropriate ADH production) ([place]) 10/10/2016   Gastroesophageal reflux disease 08/06/2016   PCP Notes >>>>>>>>>>>>>>>>> 09/21/2014   Rotator cuff tear arthropathy 04/21/2014   Abnormal MRI, shoulder 12/01/2013   Cervical radiculitis 11/10/2013   DJD (degenerative joint disease) 08/19/2013   Vitamin B 12  deficiency 05/20/2013   OSA (obstructive sleep apnea) 02/13/2012   Pulmonary nodule, last  CT 2016, stable, no f/u 01/28/2012   MCI (mild cognitive impairment) 05/29/2011   Fatty liver 02/27/2011   Annual physical exam 11/26/2010   Osteopenia 10/09/2007   COLONIC POLYPS 08/22/2006   DM II (diabetes mellitus, type II), controlled ([place]) 05/19/2006   Hyperlipidemia 05/19/2006   Depression 05/19/2006   SYNDROME, RESTLESS LEGS 05/19/2006   Hypertension 05/19/2006   IBS -- constipation 05/19/2006    Past Surgical History:  Procedure Laterality Date   BREAST BIOPSY Left 10/11/2015   fibroadenoma w/ calcifications, no evidence of malignancy, recommend mammogram repeat-6 mnths   COLONOSCOPY  04/15/2014   POLYPECTOMY     REVERSE SHOULDER ARTHROPLASTY Right 04/21/2014   Procedure: REVERSE SHOULDER ARTHROPLASTY;  Surgeon: Tania Ade, MD;  Location: [facility];  Service: Orthopedics;  Laterality: Right;  Right reverse total shoulder replacement   TONSILLECTOMY     UTERINE FIBROID EMBOLIZATION     90s    OB History   No obstetric history on file.      Home Medications    Prior to Admission medications   Medication Sig Start Date End Date Taking? Authorizing Provider  ACCU-CHEK FASTCLIX LANCETS MISC daily. use as directed 04/26/17   [provider]  acetaminophen (TYLENOL) 325 MG tablet Take 650 mg by mouth every 6 (six) hours as needed for mild pain or headache.    [provider]  atenolol (TENORMIN) 50 MG tablet Take 25 mg by mouth daily. 07/15/18   [provider]  atorvastatin (LIPITOR) 40 MG tablet TAKE 1 TABLET BY MOUTH EVERY DAY 09/20/20   Colon Branch, MD  azelastine (ASTELIN) 0.1 % nasal spray Place 2 sprays into both nostrils at bedtime as needed for rhinitis. Use in each nostril as directed 07/21/17   Colon Branch, MD  BD VEO INSULIN SYRINGE U/F 31G X 15/64" 1 ML MISC USE TO INJECT insulin EVERY DAY 05/14/17   [provider]  darifenacin (ENABLEX)  15 MG 24 hr tablet Take 1 tablet (15 mg total) by mouth daily. 11/02/18   Colon Branch, MD  donepezil (ARICEPT) 10 MG tablet Take 2 tablets (20 mg total) by mouth at bedtime.    Colon Branch, MD  gabapentin (NEURONTIN) 100 MG capsule TAKE 1 CAPSULE BY MOUTH AT BEDTIME 10/03/20   Paz, Alda Berthold, MD  Iron-Vitamin C (VITRON-C) 65-125 MG TABS Take 1 tablet by mouth daily. 04/04/20   Colon Branch, MD  LEVEMIR 100 UNIT/ML injection Inject into the skin. 07/16/19   [provider]  losartan (COZAAR) 25 MG tablet TAKE 1 TABLET BY MOUTH EVERY EVENING 09/20/20   Colon Branch, MD  memantine (NAMENDA) 10 MG tablet Take 10 mg by mouth 2 (two) times daily. 02/13/17   [provider]  metFORMIN (GLUCOPHAGE-XR) 500 MG 24 hr tablet TAKE 1 TABLET BY MOUTH EVERY MORNING WITH A MEAL 07/15/18   [provider]  Multiple Vitamins-Minerals (CENTRUM SILVER ULTRA WOMENS) TABS TAKE 1 TABLET BY MOUTH EVERY DAY 10/27/20   Colon Branch, MD  nitrofurantoin, macrocrystal-monohydrate, (MACROBID) 100 MG capsule Take 1 capsule (100 mg total) by mouth 2 (two) times daily. 11/15/20  Yes Lynden Oxford Scales, PA-C  ONE TOUCH ULTRA TEST test strip USE TO CHECK BLOOD SUGAR EVERY DAY 10/20/17   [provider]  pantoprazole (PROTONIX) 40 MG tablet Take 1 tablet (40 mg total) by mouth 2 (two) times daily before a meal. 01/06/20   Paz, Alda Berthold, MD  sertraline (ZOLOFT) 100 MG tablet TAKE 1 TABLET BY MOUTH EVERY DAY AND TAKE 1/2 TABLET AT BEDTIME 11/06/17   [provider]  sodium fluoride (FLUORISHIELD) 1.1 % GEL dental gel Place 1 application onto teeth at bedtime as needed (for teeth).     [provider]  traMADol (ULTRAM) 50 MG tablet TAKE 1 TABLET BY MOUTH EVERY TWELVE HOURS AS NEEDED 03/15/20   Colon Branch, MD  vitamin B-12 (CYANOCOBALAMIN) 1000 MCG tablet Take 1 tablet (1,000 mcg total) by mouth daily. 06/27/20   Colon Branch, MD    Family History Family History  Problem Relation Age of Onset    Lung cancer Mother        smoker   Colon cancer Father        F dx in his 36s   Ovarian cancer Paternal Aunt        ?   Diabetes Other        aunts-uncles    Stroke Other        aunts-uncles    Heart attack Maternal Grandmother        60s   Other Other        niece- glioblastoma   Breast cancer Neg Hx  Rectal cancer Neg Hx    Stomach cancer Neg Hx    Esophageal cancer Neg Hx    Colon polyps Neg Hx     Social History Social History   Tobacco Use   Smoking status: Former    Packs/day: 2.00    Years: 10.00    Pack years: 20.00    Types: Cigarettes    Quit date: 11/26/1975    Years since quitting: 45.0   Smokeless tobacco: Never   Tobacco comments:    used to smoke 2 ppd  Vaping Use   Vaping Use: Never used  Substance Use Topics   Alcohol use: Yes    Alcohol/week: 0.0 standard drinks    Comment: rarely wine   Drug use: No     Allergies   Sulfa antibiotics, Trulicity [dulaglutide], and Penicillins   Review of Systems Review of Systems Pertinent findings noted in history of present illness.    Physical Exam Triage Vital Signs ED Triage Vitals  Enc Vitals Group     BP 11/03/20 0827 (!) 147/82     Pulse Rate 11/03/20 0827 72     Resp 11/03/20 0827 18     Temp 11/03/20 0827 98.3 F (36.8 C)     Temp Source 11/03/20 0827 Oral     SpO2 11/03/20 0827 98 %     Weight --      Height --      Head Circumference --      Peak Flow --      Pain Score 11/03/20 0826 5     Pain Loc --      Pain Edu? --      Excl. in [place]? --    No data found.  Updated Vital Signs BP 122/60 (BP Location: Right Arm)   Pulse (!) 59   Temp 99 F (37.2 C) (Oral)   Resp 18   SpO2 96%   Visual Acuity Right Eye Distance:   Left Eye Distance:   Bilateral Distance:    Right Eye Near:   Left Eye Near:    Bilateral Near:     Physical Exam Vitals and nursing note reviewed.  Constitutional:      General: She is not in acute distress.    Appearance: Normal appearance. She  is obese. She is not ill-appearing.  HENT:     Head: Normocephalic and atraumatic.  Eyes:     General: Lids are normal.        Right eye: No discharge.        Left eye: No discharge.     Extraocular Movements: Extraocular movements intact.     Conjunctiva/sclera: Conjunctivae normal.     Right eye: Right conjunctiva is not injected.     Left eye: Left conjunctiva is not injected.  Neck:     Trachea: Trachea and phonation normal.  Cardiovascular:     Rate and Rhythm: Normal rate and regular rhythm.     Pulses: Normal pulses.     Heart sounds: Normal heart sounds. No murmur heard.   No friction rub. No gallop.  Pulmonary:     Effort: Pulmonary effort is normal. No accessory muscle usage, prolonged expiration or respiratory distress.     Breath sounds: Normal breath sounds. No stridor, decreased air movement or transmitted upper airway sounds. No decreased breath sounds, wheezing, rhonchi or rales.  Chest:     Chest wall: No tenderness.  Abdominal:     General: Abdomen is flat. Bowel sounds are  normal. There is no distension.     Palpations: Abdomen is soft.     Tenderness: There is no abdominal tenderness. There is no right CVA tenderness or left CVA tenderness.     Hernia: No hernia is present.  Musculoskeletal:        General: Normal range of motion.     Cervical back: Normal range of motion and neck supple. Normal range of motion.  Lymphadenopathy:     Cervical: No cervical adenopathy.  Skin:    General: Skin is warm and dry.     Findings: No erythema or rash.  Neurological:     General: No focal deficit present.     Mental Status: She is alert and oriented to person, place, and time.  Psychiatric:        Mood and Affect: Mood normal.        Behavior: Behavior normal.     UC Treatments / Results  Labs (all labs ordered are listed, but only abnormal results are displayed) Labs Reviewed  URINE CULTURE  POCT URINALYSIS DIP (MANUAL ENTRY)    EKG   Radiology No  results found.  Procedures Procedures (including critical care time)  Medications Ordered in UC Medications - No data to display  Initial Impression / Assessment and Plan / UC Course  I have reviewed the triage vital signs and the nursing notes.  Pertinent labs & imaging results that were available during my care of the patient were reviewed by me and considered in my medical decision making (see chart for details).     Given patient's age, history of urinary tract infections and similarity of current symptoms, I feel it is reasonable to begin treating patient empirically for simple cystitis with nitrofurantoin for 5 days.  Patient has been advised that if her symptoms do not resolve that she should return for repeat urinalysis, patient advised to drink plenty of water prior to arrival onto compressing the morning instead of waiting till the afternoon for better chance of isolating offending organism.  Urine culture ordered per protocol.  Patient verbalized understanding and agreement of plan as discussed.  All questions were addressed during visit.  Please see discharge instructions below for further details of plan.  Final Clinical Impressions(s) / UC Diagnoses   Final diagnoses:  Dysuria  Cystitis     Discharge Instructions      Guidelines recommend a 5-day course of Macrobid for presumed simple cystitis.  Please take 1 tablet twice daily.  Prescription has been sent to your pharmacy.  If your symptoms do not improve significantly in the next 5 days, please return for repeat evaluation.  For evaluation of urinary tract infections, it is better if you come earlier in the morning as your urine is usually more concentrated at that time and there is a better chance of [place] isolating the bacteria that may be causing the infection.     ED Prescriptions     Medication Sig Dispense Auth. Provider   nitrofurantoin, macrocrystal-monohydrate, (MACROBID) 100 MG capsule Take 1 capsule  (100 mg total) by mouth 2 (two) times daily. 10 capsule Lynden Oxford Scales, PA-C      PDMP not reviewed this encounter.   Disposition Upon Discharge:   The patient will follow up with their current PCP if and as advised. If the patient does not currently have a PCP we will assist them in obtaining one.   Return to the [facility] or PCP in 3-5 days if no better; to PCP  or the Emergency Department if new signs and symptoms develop, or if the current signs or symptoms continue to change or worsen for further workup, evaluation and treatment as clinically indicated and appropriate  Condition: stable for discharge home Home: take medications as prescribed; routine discharge instructions as discussed; follow up as advised.    Lynden Oxford Scales, PA-C 11/15/20 1616    Lynden Oxford [place], [place] 11/16/20 [phone]

## 2020-11-15 NOTE — Discharge Instructions (Addendum)
Guidelines recommend a 5-day course of Macrobid for presumed simple cystitis.  Please take 1 tablet twice daily.  Prescription has been sent to your pharmacy.  If your symptoms do not improve significantly in the next 5 days, please return for repeat evaluation.  For evaluation of urinary tract infections, it is better if you come earlier in the morning as your urine is usually more concentrated at that time and there is a better chance of [place] isolating the bacteria that may be causing the infection.

## 2020-11-17 LAB — URINE CULTURE: Culture: NO GROWTH

## 2020-12-11 ENCOUNTER — Other Ambulatory Visit: Payer: Self-pay | Admitting: Internal Medicine

## 2020-12-20 ENCOUNTER — Telehealth: Payer: Medicare Other | Admitting: Internal Medicine

## 2021-01-17 ENCOUNTER — Ambulatory Visit (INDEPENDENT_AMBULATORY_CARE_PROVIDER_SITE_OTHER): Payer: Medicare Other | Admitting: Internal Medicine

## 2021-01-17 ENCOUNTER — Encounter: Payer: Self-pay | Admitting: Internal Medicine

## 2021-01-17 VITALS — BP 136/70 | HR 70 | Temp 98.1°F | Resp 18 | Ht 67.0 in | Wt 191.0 lb

## 2021-01-17 DIAGNOSIS — F03B0 Unspecified dementia, moderate, without behavioral disturbance, psychotic disturbance, mood disturbance, and anxiety: Secondary | ICD-10-CM

## 2021-01-17 DIAGNOSIS — I10 Essential (primary) hypertension: Secondary | ICD-10-CM

## 2021-01-17 DIAGNOSIS — E785 Hyperlipidemia, unspecified: Secondary | ICD-10-CM | POA: Diagnosis not present

## 2021-01-17 NOTE — Progress Notes (Signed)
Subjective:    Patient ID: [name], female    DOB: February 28, 1945, 76 y.o.   MRN: [id number]  DOS:  01/17/2021 Type of visit - description: Follow-up Routine checkup. No major concerns. Today with talk about hypertension, high cholesterol, dementia. Will also review her immunizations.  Review of Systems See above   Past Medical History:  Diagnosis Date   Adenomatous colon polyp 03/1988   Anemia    borderline   Arthritis    R shoulder - degenerative    Cataract    beginning stages   Chest pain     Sept 2011: stress test neg   Depression    sees Dr.Cotle   Diabetes mellitus    dr Buddy Duty   Diverticulosis    Fatty liver    Increased LFTs, saw GI 06/2011, likely from fatty liver    GERD (gastroesophageal reflux disease)    History of hiatal hernia    Hyperlipemia    Hypertension    IBS (irritable bowel syndrome)    and dyspepsia   Memory impairment 08/2014   mild cognitive impairment vs. mild dementia, reommended reinstating of cholinesterase inhibitor    Osteopenia    dexa 06/2007 and 10/11 Rx CA vitamin d   RLS (restless legs syndrome)    h/o- no longer using Requip   Sleep apnea    Mouth guard use at home   Thyroid nodule     Past Surgical History:  Procedure Laterality Date   BREAST BIOPSY Left 10/11/2015   fibroadenoma w/ calcifications, no evidence of malignancy, recommend mammogram repeat-6 mnths   COLONOSCOPY  04/15/2014   POLYPECTOMY     REVERSE SHOULDER ARTHROPLASTY Right 04/21/2014   Procedure: REVERSE SHOULDER ARTHROPLASTY;  Surgeon: Tania Ade, MD;  Location: [facility];  Service: Orthopedics;  Laterality: Right;  Right reverse total shoulder replacement   TONSILLECTOMY     UTERINE FIBROID EMBOLIZATION     90s    Current Outpatient Medications  Medication Instructions   ACCU-CHEK FASTCLIX LANCETS MISC Daily, use as directed   acetaminophen (TYLENOL) 650 mg, Oral, Every 6 hours PRN   atenolol (TENORMIN) 25 mg, Oral, Daily   atorvastatin  (LIPITOR) 40 MG tablet TAKE 1 TABLET BY MOUTH EVERY DAY   azelastine (ASTELIN) 0.1 % nasal spray 2 sprays, Each Nare, At bedtime PRN, Use in each nostril as directed   BD VEO INSULIN SYRINGE U/F 31G X 15/64" 1 ML MISC USE TO INJECT insulin EVERY DAY   darifenacin (ENABLEX) 15 mg, Oral, Daily   donepezil (ARICEPT) 20 mg, Oral, Daily at bedtime   gabapentin (NEURONTIN) 100 MG capsule TAKE 1 CAPSULE BY MOUTH AT BEDTIME   Iron-Vitamin C (VITRON-C) 65-125 MG TABS 1 tablet, Oral, Daily   LEVEMIR 100 UNIT/ML injection Subcutaneous   losartan (COZAAR) 25 MG tablet TAKE 1 TABLET BY MOUTH EVERY EVENING   memantine (NAMENDA) 10 mg, Oral, 2 times daily   metFORMIN (GLUCOPHAGE-XR) 500 MG 24 hr tablet TAKE 1 TABLET BY MOUTH EVERY MORNING WITH A MEAL   Multiple Vitamins-Minerals (CENTRUM SILVER ULTRA WOMENS) TABS TAKE 1 TABLET BY MOUTH EVERY DAY   nitrofurantoin (macrocrystal-monohydrate) (MACROBID) 100 mg, Oral, 2 times daily   ONE TOUCH ULTRA TEST test strip USE TO CHECK BLOOD SUGAR EVERY DAY   pantoprazole (PROTONIX) 40 MG tablet TAKE 1 TABLET BY MOUTH 2 TIMES DAILY BEFORE A MEAL   sertraline (ZOLOFT) 100 MG tablet TAKE 1 TABLET BY MOUTH EVERY DAY AND TAKE 1/2 TABLET AT BEDTIME  sodium fluoride (FLUORISHIELD) 1.1 % GEL dental gel 1 application, dental, At bedtime PRN   traMADol (ULTRAM) 50 MG tablet TAKE 1 TABLET BY MOUTH EVERY TWELVE HOURS AS NEEDED   vitamin B-12 (CYANOCOBALAMIN) 1,000 mcg, Oral, Daily       Objective:   Physical Exam BP 136/70 (BP Location: Left Arm, Patient Position: Sitting, Cuff Size: Small)    Pulse 70    Temp 98.1 F (36.7 C) (Oral)    Resp 18    Ht 5\' 7"  (1.702 m)    Wt 191 lb (86.6 kg)    SpO2 98%    BMI 29.91 kg/m  General:   Well developed, NAD, BMI noted. HEENT:  Normocephalic . Face symmetric, atraumatic Lungs:  CTA B Normal respiratory effort, no intercostal retractions, no accessory muscle use. Heart: RRR,  no murmur.  Lower extremities: no pretibial edema  bilaterally  Skin: Not pale. Not jaundice Neurologic:  alert & oriented to self, place.  Not oriented to time.  Did not recall that her BP was checked today.  Recognizes people without any problems.  Speech normal, gait limited , currently  using boot on the left leg Psych--  Behavior appropriate. No anxious or depressed appearing.      Assessment    ASSESSMENT DM Dr. Buddy Duty HTN Hyperlipidemia H/o SIADH Depression seen elsewhere Mild cognitive impairment  MMSE 2015 --> 28, on Aricept, namenda added 04-2016 by Dr Everette Rank  OSA -- mild, also told RLS (Rx requip); saw Dr Gwenette Greet 2014, no CPAP GI: --IBS, colon polyps, diverticulosis, hiatal hernia --Chronic constipation likely part of her IBS syndrome  --Fatty liver >>> GI eval 2013 --iron fec anemia:  cscope 04-2014 : 2 polyps; + hemocult @ GI office 10-2014: EGD done (-), bx neg Osteopenia: T score -1.1  2009 , osteopenia again per dexa 05-2013, t score -1.8 (10-09-17), scanned, rx ca-vit d B12 deficiency  RLS MSK: --DJD frozen shoulder Dizziness: chronic, carotid US 2016 neg, saw cards-- not likely CV related; saw neuro DR Everette Rank 02-2015  Thyroid nodules: Incidental   by Carotid US, BX 11-2014: Atypical findings, [facility] III, f/u  Endocrine RLL Lung nodule: Stable per CT 09-2016, no need for further B/B incontinence:  uses diapers  Chest pain 09-2009, negative stress test COVID infex 09/2019, had a infusion   PLAN: DM: Per Dr. Buddy Duty. HTN: BP today is very good, reportedly BPs check at home are okay.  Continue atenolol, losartan, check CMP. High cholesterol: Continue Lipitor 40 mg, check FLP, further advised with results. Dementia: Saw neurology 06/07/2020.  At this point she seems to be stable.  Patient requests me to refill her Namenda and Aricept and I will. Osteopenia: T score -1.8, ordered by gynecology (October 2019) was rec calcium and vitamin D Social:  lives by herself at [facility] independent living, 1 bedroom apt;  still  drives (limited), she is here with Vincente Liberty, a caregiver that visited her every Wednesday.  She still goes shopping, does not pay her bills or do any cooking. Preventive care: Had a flu shot, recommend COVID booster if not done already. RTC 6 months CPX    This visit occurred during the SARS-CoV-2 public health emergency.  Safety protocols were in place, including screening questions prior to the visit, additional usage of staff PPE, and extensive cleaning of exam room while observing appropriate contact time as indicated for disinfecting solutions.

## 2021-01-17 NOTE — Patient Instructions (Addendum)
Per our records you are due for your diabetic eye exam. Please contact your eye doctor to schedule an appointment. Please have them send copies of your office visit notes to [place]. Our fax number is [phone]. If you need a referral to an eye doctor please let us know.   Please be sure you got the latest COVID-vaccine called "bivalent".  It was released in late September   GO TO THE LAB : Get the blood work     [place], [place] back for a physical exam in 6 months

## 2021-01-18 ENCOUNTER — Other Ambulatory Visit: Payer: Self-pay

## 2021-01-18 LAB — LIPID PANEL
Cholesterol: 127 mg/dL (ref 0–200)
HDL: 50.6 mg/dL (ref >39.00)
NonHDL: 76.34
Total CHOL/HDL Ratio: 3
Triglycerides: 231 mg/dL — ABNORMAL HIGH (ref 0.0–149.0)
VLDL: 46.2 mg/dL — ABNORMAL HIGH (ref 0.0–40.0)

## 2021-01-18 LAB — COMPREHENSIVE METABOLIC PANEL
ALT: 23 U/L (ref 0–35)
AST: 25 U/L (ref 0–37)
Albumin: 4.2 g/dL (ref 3.5–5.2)
Alkaline Phosphatase: 67 U/L (ref 39–117)
BUN: 10 mg/dL (ref 6–23)
CO2: 30 mEq/L (ref 19–32)
Calcium: 9.6 mg/dL (ref 8.4–10.5)
Chloride: 102 mEq/L (ref 96–112)
Creatinine, Ser: 0.81 mg/dL (ref 0.40–1.20)
GFR: 70.76 mL/min (ref >60.00)
Glucose, Bld: 135 mg/dL — ABNORMAL HIGH (ref 70–99)
Potassium: 4.7 mEq/L (ref 3.5–5.1)
Sodium: 141 mEq/L (ref 135–145)
Total Bilirubin: 0.6 mg/dL (ref 0.2–1.2)
Total Protein: 7.1 g/dL (ref 6.0–8.3)

## 2021-01-18 LAB — LDL CHOLESTEROL, DIRECT: Direct LDL: 57 mg/dL

## 2021-01-18 MED ORDER — DONEPEZIL HCL 10 MG PO TABS: 20.0000 mg | ORAL_TABLET | Freq: Every day | ORAL | 1 refills | Status: DC

## 2021-01-18 MED ORDER — MEMANTINE HCL 10 MG PO TABS: 10.0000 mg | ORAL_TABLET | Freq: Two times a day (BID) | ORAL | 1 refills | Status: DC

## 2021-01-18 NOTE — Assessment & Plan Note (Signed)
DM: Per Dr. Buddy Duty. HTN: BP today is very good, reportedly BPs check at home are okay.  Continue atenolol, losartan, check CMP. High cholesterol: Continue Lipitor 40 mg, check FLP, further advised with results. Dementia: Saw neurology 06/07/2020.  At this point she seems to be stable.  Patient requests me to refill her Namenda and Aricept and I will. Osteopenia: T score -1.8, ordered by gynecology (October 2019) was rec calcium and vitamin D Social:  lives by herself at [facility] independent living, 1 bedroom apt;  still drives (limited), she is here with Vincente Liberty, a caregiver that visited her every Wednesday.  She still goes shopping, does not pay her bills or do any cooking. Preventive care: Had a flu shot, recommend COVID booster if not done already. RTC 6 months CPX

## 2021-01-22 ENCOUNTER — Encounter: Payer: Self-pay | Admitting: Internal Medicine

## 2021-02-20 ENCOUNTER — Encounter: Payer: Self-pay | Admitting: Internal Medicine

## 2021-03-08 ENCOUNTER — Other Ambulatory Visit: Payer: Self-pay | Admitting: Internal Medicine

## 2021-03-12 ENCOUNTER — Other Ambulatory Visit: Payer: Self-pay | Admitting: Internal Medicine

## 2021-03-13 LAB — HM DIABETES FOOT EXAM

## 2021-04-12 ENCOUNTER — Ambulatory Visit (INDEPENDENT_AMBULATORY_CARE_PROVIDER_SITE_OTHER): Payer: Medicare Other

## 2021-04-12 VITALS — Ht 66.0 in | Wt 192.6 lb

## 2021-04-12 DIAGNOSIS — Z0000 Encounter for general adult medical examination without abnormal findings: Secondary | ICD-10-CM

## 2021-04-12 NOTE — Patient Instructions (Signed)
[name] , ?Thank you for taking time to come for your Medicare Wellness Visit. I appreciate your ongoing commitment to your health goals. Please review the following plan we discussed and let me know if I can assist you in the future.  ? ?Screening recommendations/referrals: ?Colonoscopy: completed 08/04/2019, due 08/03/2021 ?Mammogram: completed 11/01/2020, due 11/02/2021 ?Bone Density: completed 11/05/2017 ?Recommended yearly ophthalmology/optometry visit for glaucoma screening and checkup ?Recommended yearly dental visit for hygiene and checkup ? ?Vaccinations: ?Influenza vaccine: due next flu season ?Pneumococcal vaccine: completed 12/24/2013 ?Tdap vaccine: completed 11/30/2012, due 12/01/2022 ?Shingles vaccine: completed   ?Covid-19: 01/07/2020, 02/08/2019, 01/11/2019 ? ?Advanced directives: copy in chart ? ?Conditions/risks identified: none ? ?Next appointment: Follow up in one year for your annual wellness visit  ? ? ?Preventive Care 65 Years and Older, Female ?Preventive care refers to lifestyle choices and visits with your health care provider that can promote health and wellness. ?What does preventive care include? ?A yearly physical exam. This is also called an annual well check. ?Dental exams once or twice a year. ?Routine eye exams. Ask your health care provider how often you should have your eyes checked. ?Personal lifestyle choices, including: ?Daily care of your teeth and gums. ?Regular physical activity. ?Eating a healthy diet. ?Avoiding tobacco and drug use. ?Limiting alcohol use. ?Practicing safe sex. ?Taking low-dose aspirin every day. ?Taking vitamin and mineral supplements as recommended by your health care provider. ?What happens during an annual well check? ?The services and screenings done by your health care provider during your annual well check will depend on your age, overall health, lifestyle risk factors, and family history of disease. ?Counseling  ?Your health care provider may ask you  questions about your: ?Alcohol use. ?Tobacco use. ?Drug use. ?Emotional well-being. ?Home and relationship well-being. ?Sexual activity. ?Eating habits. ?History of falls. ?Memory and ability to understand (cognition). ?Work and work Statistician. ?Reproductive health. ?Screening  ?You may have the following tests or measurements: ?Height, weight, and BMI. ?Blood pressure. ?Lipid and cholesterol levels. These may be checked every 5 years, or more frequently if you are over 32 years old. ?Skin check. ?Lung cancer screening. You may have this screening every year starting at age 11 if you have a 30-pack-year history of smoking and currently smoke or have quit within the past 15 years. ?Fecal occult blood test (FOBT) of the stool. You may have this test every year starting at age 59. ?Flexible sigmoidoscopy or colonoscopy. You may have a sigmoidoscopy every 5 years or a colonoscopy every 10 years starting at age 32. ?Hepatitis C blood test. ?Hepatitis B blood test. ?Sexually transmitted disease (STD) testing. ?Diabetes screening. This is done by checking your blood sugar (glucose) after you have not eaten for a while (fasting). You may have this done every 1-3 years. ?Bone density scan. This is done to screen for osteoporosis. You may have this done starting at age 16. ?Mammogram. This may be done every 1-2 years. Talk to your health care provider about how often you should have regular mammograms. ?Talk with your health care provider about your test results, treatment options, and if necessary, the need for more tests. ?Vaccines  ?Your health care provider may recommend certain vaccines, such as: ?Influenza vaccine. This is recommended every year. ?Tetanus, diphtheria, and acellular pertussis (Tdap, Td) vaccine. You may need a Td booster every 10 years. ?Zoster vaccine. You may need this after age 60. ?Pneumococcal 13-valent conjugate (PCV13) vaccine. One dose is recommended after age 70. ?Pneumococcal polysaccharide  (PPSV23)  vaccine. One dose is recommended after age 43. ?Talk to your health care provider about which screenings and vaccines you need and how often you need them. ?This information is not intended to replace advice given to you by your health care provider. Make sure you discuss any questions you have with your health care provider. ?Document Released: 01/20/2015 Document Revised: 09/13/2015 Document Reviewed: 10/25/2014 ?Elsevier Interactive Patient Education ? 2017 [place]. ? ?Fall Prevention in the Home ?Falls can cause injuries. They can happen to people of all ages. There are many things you can do to make your home safe and to help prevent falls. ?What can I do on the outside of my home? ?Regularly fix the edges of walkways and driveways and fix any cracks. ?Remove anything that might make you trip as you walk through a door, such as a raised step or threshold. ?Trim any bushes or trees on the path to your home. ?Use bright outdoor lighting. ?Clear any walking paths of anything that might make someone trip, such as rocks or tools. ?Regularly check to see if handrails are loose or broken. Make sure that both sides of any steps have handrails. ?Any raised decks and porches should have guardrails on the edges. ?Have any leaves, snow, or ice cleared regularly. ?Use sand or salt on walking paths during winter. ?Clean up any spills in your garage right away. This includes oil or grease spills. ?What can I do in the bathroom? ?Use night lights. ?Install grab bars by the toilet and in the tub and shower. Do not use towel bars as grab bars. ?Use non-skid mats or decals in the tub or shower. ?If you need to sit down in the shower, use a plastic, non-slip stool. ?Keep the floor dry. Clean up any water that spills on the floor as soon as it happens. ?Remove soap buildup in the tub or shower regularly. ?Attach bath mats securely with double-sided non-slip rug tape. ?Do not have throw rugs and other things on the  floor that can make you trip. ?What can I do in the bedroom? ?Use night lights. ?Make sure that you have a light by your bed that is easy to reach. ?Do not use any sheets or blankets that are too big for your bed. They should not hang down onto the floor. ?Have a firm chair that has side arms. You can use this for support while you get dressed. ?Do not have throw rugs and other things on the floor that can make you trip. ?What can I do in the kitchen? ?Clean up any spills right away. ?Avoid walking on wet floors. ?Keep items that you use a lot in easy-to-reach places. ?If you need to reach something above you, use a strong step stool that has a grab bar. ?Keep electrical cords out of the way. ?Do not use floor polish or wax that makes floors slippery. If you must use wax, use non-skid floor wax. ?Do not have throw rugs and other things on the floor that can make you trip. ?What can I do with my stairs? ?Do not leave any items on the stairs. ?Make sure that there are handrails on both sides of the stairs and use them. Fix handrails that are broken or loose. Make sure that handrails are as long as the stairways. ?Check any carpeting to make sure that it is firmly attached to the stairs. Fix any carpet that is loose or worn. ?Avoid having throw rugs at the top or bottom of  the stairs. If you do have throw rugs, attach them to the floor with carpet tape. ?Make sure that you have a light switch at the top of the stairs and the bottom of the stairs. If you do not have them, ask someone to add them for you. ?What else can I do to help prevent falls? ?Wear shoes that: ?Do not have high heels. ?Have rubber bottoms. ?Are comfortable and fit you well. ?Are closed at the toe. Do not wear sandals. ?If you use a stepladder: ?Make sure that it is fully opened. Do not climb a closed stepladder. ?Make sure that both sides of the stepladder are locked into place. ?Ask someone to hold it for you, if possible. ?Clearly mark and make  sure that you can see: ?Any grab bars or handrails. ?First and last steps. ?Where the edge of each step is. ?Use tools that help you move around (mobility aids) if they are needed. These include: ?Canes. ?W

## 2021-04-12 NOTE — Progress Notes (Addendum)
?I connected with [name] today by telephone and verified that I am speaking with the correct person using two identifiers. ?Location patient: home ?Location provider: work ?Persons participating in the virtual visit: Tiea Manninen, Glenna Durand LPN. ?  ?I discussed the limitations, risks, security and privacy concerns of performing an evaluation and management service by telephone and the availability of in person appointments. I also discussed with the patient that there may be a patient responsible charge related to this service. The patient expressed understanding and verbally consented to this telephonic visit.  ?  ?Interactive audio and video telecommunications were attempted between this provider and patient, however failed, due to patient having technical difficulties OR patient did not have access to video capability.  We continued and completed visit with audio only. ? ?  ? ?Vital signs may be patient reported or missing. ? ?Subjective:  ? [name] is a 76 y.o. female who presents for Medicare Annual (Subsequent) preventive examination. ? ?Review of Systems    ? ?Cardiac Risk Factors include: advanced age (>35mn, >>51women);diabetes mellitus;dyslipidemia;hypertension;obesity (BMI >30kg/m2) ? ?   ?Objective:  ?  ?Today's Vitals  ? 04/12/21 1231 04/12/21 1234  ?Weight: 192 lb 9.6 oz (87.4 kg)   ?Height: '5\' 6"'$  (1.676 m)   ?PainSc:  5   ? ?Body mass index is 31.09 kg/m?. ? ? ?  04/12/2021  ? 12:47 PM 03/30/2019  ? 11:22 AM 11/05/2018  ?  2:46 PM 03/09/2018  ? 11:10 AM 06/23/2017  ?  2:05 AM 01/13/2017  ?  9:31 AM 08/11/2016  ?  3:09 PM  ?Advanced Directives  ?Does Patient Have a Medical Advance Directive? Yes Yes Yes Yes No Yes No  ?Type of AParamedicof AByronLiving will HBernalilloLiving will HElidaLiving will HCalpineLiving will  Healthcare Power of Attorney   ?Does patient want to make changes to medical advance  directive?   No - Patient declined   No - Patient declined   ?Copy of HAmoritain Chart? Yes - validated most recent copy scanned in chart (See row information)  Yes - validated most recent copy scanned in chart (See row information) No - copy requested  Yes   ?Would patient like information on creating a medical advance directive?     No - Patient declined    ? ? ?Current Medications (verified) ?Outpatient Encounter Medications as of 04/12/2021  ?Medication Sig  ? ACCU-CHEK FASTCLIX LANCETS MISC daily. use as directed  ? acetaminophen (TYLENOL) 325 MG tablet Take 650 mg by mouth every 6 (six) hours as needed for mild pain or headache.  ? atenolol (TENORMIN) 50 MG tablet Take 25 mg by mouth daily.  ? atorvastatin (LIPITOR) 40 MG tablet TAKE 1 TABLET BY MOUTH EVERY DAY  ? azelastine (ASTELIN) 0.1 % nasal spray Place 2 sprays into both nostrils at bedtime as needed for rhinitis. Use in each nostril as directed  ? BD VEO INSULIN SYRINGE U/F 31G X 15/64" 1 ML MISC USE TO INJECT insulin EVERY DAY  ? donepezil (ARICEPT) 10 MG tablet Take 2 tablets (20 mg total) by mouth at bedtime.  ? gabapentin (NEURONTIN) 100 MG capsule TAKE 1 CAPSULE BY MOUTH AT BEDTIME  ? Iron-Vitamin C (VITRON-C) 65-125 MG TABS TAKE 1 TABLET BY MOUTH EVERY DAY  ? LEVEMIR 100 UNIT/ML injection Inject into the skin.  ? losartan (COZAAR) 25 MG tablet TAKE 1 TABLET BY MOUTH EVERY  EVENING  ? memantine (NAMENDA) 10 MG tablet Take 1 tablet (10 mg total) by mouth 2 (two) times daily.  ? metFORMIN (GLUCOPHAGE-XR) 500 MG 24 hr tablet TAKE 1 TABLET BY MOUTH EVERY MORNING WITH A MEAL  ? mirabegron ER (MYRBETRIQ) 50 MG TB24 tablet Take 1 tablet by mouth daily.  ? Multiple Vitamins-Minerals (CENTRUM SILVER ULTRA WOMENS) TABS TAKE 1 TABLET BY MOUTH EVERY DAY  ? ONE TOUCH ULTRA TEST test strip USE TO CHECK BLOOD SUGAR EVERY DAY  ? pantoprazole (PROTONIX) 40 MG tablet TAKE 1 TABLET BY MOUTH 2 TIMES DAILY BEFORE A MEAL  ? sertraline (ZOLOFT) 100 MG  tablet TAKE 1 TABLET BY MOUTH EVERY DAY AND TAKE 1/2 TABLET AT BEDTIME  ? sodium fluoride (FLUORISHIELD) 1.1 % GEL dental gel Place 1 application onto teeth at bedtime as needed (for teeth).   ? vitamin B-12 (CYANOCOBALAMIN) 1000 MCG tablet Take 1 tablet (1,000 mcg total) by mouth daily.  ? darifenacin (ENABLEX) 15 MG 24 hr tablet Take 1 tablet (15 mg total) by mouth daily. (Patient not taking: Reported on 04/12/2021)  ? traMADol (ULTRAM) 50 MG tablet TAKE 1 TABLET BY MOUTH EVERY TWELVE HOURS AS NEEDED (Patient not taking: Reported on 01/17/2021)  ? ?No facility-administered encounter medications on file as of 04/12/2021.  ? ? ?Allergies (verified) ?Sulfa antibiotics, Trulicity [dulaglutide], and Penicillins  ? ?History: ?Past Medical History:  ?Diagnosis Date  ? Adenomatous colon polyp 03/1988  ? Anemia   ? borderline  ? Arthritis   ? R shoulder - degenerative   ? Cataract   ? beginning stages  ? Chest pain   ?  Sept 2011: stress test neg  ? Depression   ? sees Dr.Cotle  ? Diabetes mellitus   ? dr Buddy Duty  ? Diverticulosis   ? Fatty liver   ? Increased LFTs, saw GI 06/2011, likely from fatty liver   ? GERD (gastroesophageal reflux disease)   ? History of hiatal hernia   ? Hyperlipemia   ? Hypertension   ? IBS (irritable bowel syndrome)   ? and dyspepsia  ? Memory impairment 08/2014  ? mild cognitive impairment vs. mild dementia, reommended reinstating of cholinesterase inhibitor   ? Osteopenia   ? dexa 06/2007 and 10/11 Rx CA vitamin d  ? RLS (restless legs syndrome)   ? h/o- no longer using Requip  ? Sleep apnea   ? Mouth guard use at home  ? Thyroid nodule   ? ?Past Surgical History:  ?Procedure Laterality Date  ? BREAST BIOPSY Left 10/11/2015  ? fibroadenoma w/ calcifications, no evidence of malignancy, recommend mammogram repeat-6 mnths  ? COLONOSCOPY  04/15/2014  ? POLYPECTOMY    ? REVERSE SHOULDER ARTHROPLASTY Right 04/21/2014  ? Procedure: REVERSE SHOULDER ARTHROPLASTY;  Surgeon: Tania Ade, MD;  Location: [place] [facility];   Service: Orthopedics;  Laterality: Right;  Right reverse total shoulder replacement  ? TONSILLECTOMY    ? UTERINE FIBROID EMBOLIZATION    ? 90s  ? ?Family History  ?Problem Relation Age of Onset  ? Lung cancer Mother   ?     smoker  ? Colon cancer Father   ?     F dx in his 26s  ? Ovarian cancer Paternal Aunt   ?     ?  ? Diabetes Other   ?     aunts-uncles   ? Stroke Other   ?     aunts-uncles   ? Heart attack Maternal Grandmother   ?  26s  ? Other Other   ?     niece- glioblastoma  ? Breast cancer Neg Hx   ? Rectal cancer Neg Hx   ? Stomach cancer Neg Hx   ? Esophageal cancer Neg Hx   ? Colon polyps Neg Hx   ? ?Social History  ? ?Socioeconomic History  ? Marital status: Widowed  ?  Spouse name: [name]  ? Number of children: 3  ? Years of education: Not on file  ? Highest education level: Not on file  ?Occupational History  ? Occupation: retired, was a Leisure centre manager)  ?  Comment: RN  ?Tobacco Use  ? Smoking status: Former  ?  Packs/day: 2.00  ?  Years: 10.00  ?  Pack years: 20.00  ?  Types: Cigarettes  ?  Quit date: 11/26/1975  ?  Years since quitting: 45.4  ? Smokeless tobacco: Never  ? Tobacco comments:  ?  used to smoke 2 ppd  ?Vaping Use  ? Vaping Use: Never used  ?Substance and Sexual Activity  ? Alcohol use: Yes  ?  Alcohol/week: 0.0 standard drinks  ?  Comment: rarely wine  ? Drug use: No  ? Sexual activity: Not Currently  ?Other Topics Concern  ? Not on file  ?Social History Narrative  ? Widow , lives by herself at [facility] independent living, 1 bedroom apt;  still drives (limited) , daughters [name]) live  in [place]  ? Son in [place]    ? 5 g-children  ? ?Social Determinants of Health  ? ?Financial Resource Strain: Low Risk   ? Difficulty of Paying Living Expenses: Not hard at all  ?Food Insecurity: No Food Insecurity  ? Worried About Charity fundraiser in the Last Year: Never true  ? Ran Out of Food in the Last Year: Never true  ?Transportation Needs: No Transportation Needs  ? Lack  of Transportation (Medical): No  ? Lack of Transportation (Non-Medical): No  ?Physical Activity: Inactive  ? Days of Exercise per Week: 0 days  ? Minutes of Exercise per Session: 0 min  ?Stress: No Str

## 2021-04-17 ENCOUNTER — Encounter: Payer: Self-pay | Admitting: Internal Medicine

## 2021-05-23 ENCOUNTER — Other Ambulatory Visit: Payer: Self-pay | Admitting: Internal Medicine

## 2021-05-25 ENCOUNTER — Ambulatory Visit: Payer: Medicare Other | Admitting: Internal Medicine

## 2021-05-29 ENCOUNTER — Ambulatory Visit: Payer: Medicare Other | Admitting: Family Medicine

## 2021-05-29 ENCOUNTER — Encounter: Payer: Self-pay | Admitting: Family Medicine

## 2021-05-29 ENCOUNTER — Ambulatory Visit (INDEPENDENT_AMBULATORY_CARE_PROVIDER_SITE_OTHER): Payer: Medicare Other | Admitting: Family Medicine

## 2021-05-29 ENCOUNTER — Ambulatory Visit (HOSPITAL_BASED_OUTPATIENT_CLINIC_OR_DEPARTMENT_OTHER)
Admission: RE | Admit: 2021-05-29 | Discharge: 2021-05-29 | Disposition: A | Discharge status: Home / Self Care | Payer: Medicare Other | Source: Ambulatory Visit | Attending: Family Medicine | Admitting: Family Medicine

## 2021-05-29 VITALS — BP 116/60 | HR 98 | Temp 98.1°F | Resp 12 | Ht 66.0 in | Wt 191.6 lb

## 2021-05-29 DIAGNOSIS — S99911A Unspecified injury of right ankle, initial encounter: Secondary | ICD-10-CM | POA: Diagnosis present

## 2021-05-29 NOTE — Progress Notes (Signed)
   Acute Office Visit  Subjective:     Patient ID: [name], female    DOB: 12-19-1945, 76 y.o.   MRN: [id number]  Chief Complaint  Patient presents with   right ankel pain    Step off of something and twisted on Sunday?    HPI Patient is in today for right ankle pain.    Onset: a few days ago, stepping on a curb and thinks [name] twisted her ankle Location: right lateral ankle Duration: 2-3 days Characteristics: pain, swelling; no bruising Aggravating factors: weight bearing, internal rotation, dorsiflexion  Alleviating factors: ice, tylenol, ibuprofen,  Radiating/associated symptoms: pain radiates slightly up lower leg, no numbness/tingling temperature changes  Timing: constant  Severity: 7/10 without any medications onboard [name] is able to walk short distances without assistive device and drove herself here today   ROS All review of systems negative except what is listed in the HPI      Objective:    BP 116/60 (BP Location: Left Arm, Cuff Size: Normal)   Pulse 98   Temp 98.1 F (36.7 C) (Oral)   Resp 12   Ht '5\' 6"'$  (1.676 m)   Wt 191 lb 9.6 oz (86.9 kg)   SpO2 (!) 52%   BMI 30.93 kg/m    Physical Exam Vitals reviewed.  Constitutional:      Appearance: Normal appearance.  Musculoskeletal:        General: Swelling and tenderness present.     Comments: Right ankle: generalized malleolar zone pain and edema with significant tenderness to light palpation of lateral malleolus  Skin:    General: Skin is warm and dry.     Findings: No bruising or erythema.  Neurological:     General: No focal deficit present.     Mental Status: [name] is alert and oriented to person, place, and time. Mental status is at baseline.  Psychiatric:        Mood and Affect: Mood normal.        Behavior: Behavior normal.        Thought Content: Thought content normal.        Judgment: Judgment normal.    No results found for any visits on 05/29/21.      Assessment & Plan:    1. Right ankle injury, initial encounter Xray today - we will update you with results once the radiologist interprets the image Rest, ice, compression, elevation, tylenol/ibuprofen as needed Wear the boot that you have whenever you are up walking around (may need for about 4-6 weeks, but let's see what the xray says) We will schedule follow-up based on xrays  - DG Ankle Complete Right; Future   Return for pending xray .  Terrilyn Saver, NP

## 2021-05-29 NOTE — Patient Instructions (Addendum)
Xray today - we will update you with results once the radiologist interprets the image Rest, ice, compression, elevation, tylenol/ibuprofen as needed Wear the boot that you have whenever you are up walking around (may need for about 4-6 weeks, but let's see what the xray says) We will schedule follow-up based on xrays

## 2021-06-15 ENCOUNTER — Ambulatory Visit: Payer: Medicare Other | Admitting: Podiatry

## 2021-07-18 ENCOUNTER — Ambulatory Visit (INDEPENDENT_AMBULATORY_CARE_PROVIDER_SITE_OTHER): Payer: Medicare Other | Admitting: Internal Medicine

## 2021-07-18 ENCOUNTER — Encounter: Payer: Self-pay | Admitting: Internal Medicine

## 2021-07-18 ENCOUNTER — Encounter: Payer: Medicare Other | Admitting: Internal Medicine

## 2021-07-18 VITALS — BP 134/84 | HR 65 | Temp 97.6°F | Resp 18 | Ht 66.0 in | Wt 192.1 lb

## 2021-07-18 DIAGNOSIS — Z0001 Encounter for general adult medical examination with abnormal findings: Secondary | ICD-10-CM

## 2021-07-18 DIAGNOSIS — Z0000 Encounter for general adult medical examination without abnormal findings: Secondary | ICD-10-CM | POA: Diagnosis not present

## 2021-07-18 DIAGNOSIS — Z8041 Family history of malignant neoplasm of ovary: Secondary | ICD-10-CM

## 2021-07-18 DIAGNOSIS — F32A Depression, unspecified: Secondary | ICD-10-CM

## 2021-07-18 DIAGNOSIS — M8580 Other specified disorders of bone density and structure, unspecified site: Secondary | ICD-10-CM

## 2021-07-18 DIAGNOSIS — I10 Essential (primary) hypertension: Secondary | ICD-10-CM | POA: Diagnosis not present

## 2021-07-18 DIAGNOSIS — E785 Hyperlipidemia, unspecified: Secondary | ICD-10-CM | POA: Diagnosis not present

## 2021-07-18 DIAGNOSIS — R296 Repeated falls: Secondary | ICD-10-CM

## 2021-07-18 DIAGNOSIS — S99911A Unspecified injury of right ankle, initial encounter: Secondary | ICD-10-CM | POA: Diagnosis not present

## 2021-07-18 DIAGNOSIS — F03B0 Unspecified dementia, moderate, without behavioral disturbance, psychotic disturbance, mood disturbance, and anxiety: Secondary | ICD-10-CM

## 2021-07-18 DIAGNOSIS — Z23 Encounter for immunization: Secondary | ICD-10-CM | POA: Diagnosis not present

## 2021-07-18 DIAGNOSIS — Z780 Asymptomatic menopausal state: Secondary | ICD-10-CM

## 2021-07-18 DIAGNOSIS — Z1211 Encounter for screening for malignant neoplasm of colon: Secondary | ICD-10-CM

## 2021-07-18 DIAGNOSIS — R269 Unspecified abnormalities of gait and mobility: Secondary | ICD-10-CM

## 2021-07-18 DIAGNOSIS — E118 Type 2 diabetes mellitus with unspecified complications: Secondary | ICD-10-CM

## 2021-07-18 MED ORDER — TETANUS-DIPHTH-ACELL PERTUSSIS 5-2.5-18.5 LF-MCG/0.5 IM SUSP: 0.5000 mL | Freq: Once | INTRAMUSCULAR | 0 refills | Status: AC

## 2021-07-18 NOTE — Progress Notes (Unsigned)
Subjective:    Patient ID: [name], female    DOB: [birth year], 76 y.o.   MRN: [id number]  DOS:  07/18/2021 Type of visit - description: cpx  Here for CPX She has a history of dementia, unclear if her history is reliable. For that reason, I called the patient's daughter and we had extended conversation about her care. Reports she is still hurting at the right ankle.  Review of Systems Unable to get a reliable history due to dementia.  Past Medical History:  Diagnosis Date   Adenomatous colon polyp 03/1988   Anemia    borderline   Arthritis    R shoulder - degenerative    Cataract    beginning stages   Chest pain     Sept 2011: stress test neg   Depression    sees Dr.Cotle   Diabetes mellitus    dr Buddy Duty   Diverticulosis    Fatty liver    Increased LFTs, saw GI 06/2011, likely from fatty liver    GERD (gastroesophageal reflux disease)    History of hiatal hernia    Hyperlipemia    Hypertension    IBS (irritable bowel syndrome)    and dyspepsia   Memory impairment 08/2014   mild cognitive impairment vs. mild dementia, reommended reinstating of cholinesterase inhibitor    Osteopenia    dexa 06/2007 and 10/11 Rx CA vitamin d   RLS (restless legs syndrome)    h/o- no longer using Requip   Sleep apnea    Mouth guard use at home   Thyroid nodule     Past Surgical History:  Procedure Laterality Date   BREAST BIOPSY Left 10/11/2015   fibroadenoma w/ calcifications, no evidence of malignancy, recommend mammogram repeat-6 mnths   COLONOSCOPY  04/15/2014   POLYPECTOMY     REVERSE SHOULDER ARTHROPLASTY Right 04/21/2014   Procedure: REVERSE SHOULDER ARTHROPLASTY;  Surgeon: Tania Ade, MD;  Location: [facility];  Service: Orthopedics;  Laterality: Right;  Right reverse total shoulder replacement   TONSILLECTOMY     UTERINE FIBROID EMBOLIZATION     90s    Social History   Socioeconomic History   Marital status: Widowed    Spouse name: [name]   Number of  children: 3   Years of education: Not on file   Highest education level: Not on file  Occupational History   Occupation: retired, was a Geophysicist/field seismologist health)    Comment: RN  Tobacco Use   Smoking status: Former    Packs/day: 2.00    Years: 10.00    Total pack years: 20.00    Types: Cigarettes    Quit date: 11/26/1975    Years since quitting: 45.6   Smokeless tobacco: Never   Tobacco comments:    used to smoke 2 ppd  Vaping Use   Vaping Use: Never used  Substance and Sexual Activity   Alcohol use: Yes    Alcohol/week: 0.0 standard drinks of alcohol    Comment: rarely wine   Drug use: No   Sexual activity: Not Currently  Other Topics Concern   Not on file  Social History Narrative   Widow , lives by herself at [facility] independent living, 1 bedroom apt;  still drives (limited) .   Daughters Merchant navy officer) live  in [place] in [place]     5 g-children   Social Determinants of Health   Financial Resource Strain: Low Risk  (04/12/2021)   Overall Financial Resource Strain (CARDIA)  Difficulty of Paying Living Expenses: Not hard at all  Food Insecurity: No Food Insecurity (04/12/2021)   Hunger Vital Sign    Worried About Running Out of Food in the Last Year: Never true    Ran Out of Food in the Last Year: Never true  Transportation Needs: No Transportation Needs (04/12/2021)   PRAPARE - Hydrologist (Medical): No    Lack of Transportation (Non-Medical): No  Physical Activity: Inactive (04/12/2021)   Exercise Vital Sign    Days of Exercise per Week: 0 days    Minutes of Exercise per Session: 0 min  Stress: No Stress Concern Present (04/12/2021)   Reform    Feeling of Stress : Not at all  Social Connections: Not on file  Intimate Partner Violence: Not on file     Current Outpatient Medications  Medication Instructions   ACCU-CHEK FASTCLIX LANCETS MISC Daily, use as  directed   acetaminophen (TYLENOL) 650 mg, Oral, Every 6 hours PRN   atenolol (TENORMIN) 25 mg, Oral, Daily   atorvastatin (LIPITOR) 40 MG tablet TAKE 1 TABLET BY MOUTH EVERY DAY   azelastine (ASTELIN) 0.1 % nasal spray 2 sprays, Each Nare, At bedtime PRN, Use in each nostril as directed   BD VEO INSULIN SYRINGE U/F 31G X 15/64" 1 ML MISC USE TO INJECT insulin EVERY DAY   donepezil (ARICEPT) 20 mg, Oral, Daily at bedtime   gabapentin (NEURONTIN) 100 MG capsule TAKE 1 CAPSULE BY MOUTH AT BEDTIME   Iron-Vitamin C (VITRON-C) 65-125 MG TABS TAKE 1 TABLET BY MOUTH EVERY DAY   LEVEMIR 100 UNIT/ML injection Subcutaneous   losartan (COZAAR) 25 MG tablet TAKE 1 TABLET BY MOUTH EVERY EVENING   memantine (NAMENDA) 10 mg, Oral, 2 times daily   metFORMIN (GLUCOPHAGE-XR) 500 MG 24 hr tablet TAKE 1 TABLET BY MOUTH EVERY MORNING WITH A MEAL   mirabegron ER (MYRBETRIQ) 50 MG TB24 tablet 1 tablet, Oral, Daily   Multiple Vitamins-Minerals (CENTRUM SILVER ULTRA WOMENS) TABS TAKE 1 TABLET BY MOUTH EVERY DAY   ONE TOUCH ULTRA TEST test strip USE TO CHECK BLOOD SUGAR EVERY DAY   pantoprazole (PROTONIX) 40 MG tablet TAKE 1 TABLET BY MOUTH 2 TIMES DAILY BEFORE A meal   sertraline (ZOLOFT) 100 MG tablet TAKE 1 TABLET BY MOUTH EVERY DAY AND TAKE 1/2 TABLET AT BEDTIME   sodium fluoride (FLUORISHIELD) 1.1 % GEL dental gel 1 application , dental, At bedtime PRN   traMADol (ULTRAM) 50 MG tablet TAKE 1 TABLET BY MOUTH EVERY TWELVE HOURS AS NEEDED   vitamin B-12 (CYANOCOBALAMIN) 1000 MCG tablet TAKE 1 TABLET BY MOUTH EVERY DAY       Objective:   Physical Exam BP 134/84   Pulse 65   Temp 97.6 F (36.4 C) (Oral)   Resp 18   Ht '5\' 6"'$  (1.676 m)   Wt 192 lb 2 oz (87.1 kg)   SpO2 96%   BMI 31.01 kg/m  General: Well developed, NAD, BMI noted Neck: No  thyromegaly  HEENT:  Normocephalic . Face symmetric, atraumatic Lungs:  CTA B Normal respiratory effort, no intercostal retractions, no accessory muscle  use. Heart: RRR,  no murmur.  Abdomen:  Not distended, soft, non-tender. No rebound or rigidity.   Lower extremities: Left ankle normal, R ankle: No deformities, no swelling.  Slightly TTP at the external malleolus.  Range of motion is normal but reports pain with flexion extension of the  foot. Skin: Exposed areas without rash. Not pale. Not jaundice Neurologic:  alert & oriented to self and place, not to time. Speech normal, gait assisted by cane, transferring very difficult, needed help Strength symmetric and appropriate for age.  Psych: Behavior appropriate. No anxious or depressed appearing.     Assessment     ASSESSMENT DM Dr. Buddy Duty HTN Hyperlipidemia H/o SIADH Depression seen elsewhere Mild cognitive impairment  MMSE 2015 --> 28, on Aricept, namenda added 04-2016 by Dr Everette Rank  OSA -- mild, also told RLS (Rx requip); saw Dr Gwenette Greet 2014, no CPAP GI: --IBS, colon polyps, diverticulosis, hiatal hernia --Chronic constipation likely part of her IBS syndrome  --Fatty liver >>> GI eval 2013 --iron fec anemia:  cscope 04-2014 : 2 polyps; + hemocult @ GI office 10-2014: EGD done (-), bx neg Osteopenia: T score -1.1  2009 , osteopenia again per dexa 05-2013, t score -1.8 (10-09-17), scanned, rx ca-vit d B12 deficiency  RLS MSK: --DJD frozen shoulder Dizziness: chronic, carotid US 2016 neg, saw cards-- not likely CV related; saw neuro DR Everette Rank 02-2015  Thyroid nodules: Incidental   by Carotid US, BX 11-2014: Atypical findings, [facility] III, f/u  Endocrine RLL Lung nodule: Stable per CT 09-2016, no need for further B/B incontinence:  uses diapers  Chest pain 09-2009, negative stress test   PLAN: Here for CPX, I spoke with her daughter [name] extensively about her her chronic medical problems and physical exam. DM: Per Endo, recommend to be seen every few months, discussed with the patient's daughter.  (Addendum, blood sugar elevated, will check A1c) HTN: BP is very good today, continue  atenolol, losartan, check labs. Hyperlipidemia: On Lipitor, well-controlled per last FLP. Depression: pt admits to episodic depression but recommend no  change for now. Dementia: On Aricept and Namenda, although I did not perform MMSE, I noted her memory has deteriorated, the patient's daughter agrees. I am concerned about driving, we agreed w/ [name]  that the family will ask her to stop driving and they will help with transportation  to church and going shopping. Osteopenia: Last T score 2019, recheck. Gait disorder: Having more difficulty transferring, we agreed to refer to physical therapy. Right ankle pain: Started few weeks ago, initial x-ray negative, will rex-ray.  Addendum: Patient did not stop for x-ray, recommend [name] to bring her back for the x-ray if pain persists RTC 4- 5   months  In addition to CPX and seen the patient, I had extended phone conversation with the patient's daughter regards chronic medical problems, and CPX.  See documentation above.

## 2021-07-18 NOTE — Patient Instructions (Addendum)
  Vaccines I recommend: Tetanus booster (Tdap) COVID booster Flu shot this fall   See Dr. Buddy Duty regularly for your diabetes   Check the  blood pressure regularly BP GOAL is between 110/65 and  135/85. If it is consistently higher or lower, let me know     GO TO THE LAB : Get the blood work     [place], [place] back for a checkup in 4 to 5 months   Per our records you are due for your diabetic eye exam. Please contact your eye doctor to schedule an appointment. Please have them send copies of your office visit notes to [place]. Our fax number is [phone]. If you need a referral to an eye doctor please let us know.

## 2021-07-19 ENCOUNTER — Other Ambulatory Visit (INDEPENDENT_AMBULATORY_CARE_PROVIDER_SITE_OTHER): Payer: Medicare Other

## 2021-07-19 ENCOUNTER — Encounter: Payer: Self-pay | Admitting: Internal Medicine

## 2021-07-19 DIAGNOSIS — E118 Type 2 diabetes mellitus with unspecified complications: Secondary | ICD-10-CM | POA: Diagnosis not present

## 2021-07-19 LAB — CBC WITH DIFFERENTIAL/PLATELET
Basophils Absolute: 0.1 10*3/uL (ref 0.0–0.1)
Basophils Relative: 0.8 % (ref 0.0–3.0)
Eosinophils Absolute: 0.3 10*3/uL (ref 0.0–0.7)
Eosinophils Relative: 3.8 % (ref 0.0–5.0)
HCT: 35.3 % — ABNORMAL LOW (ref 36.0–46.0)
Hemoglobin: 11.9 g/dL — ABNORMAL LOW (ref 12.0–15.0)
Lymphocytes Relative: 25.3 % (ref 12.0–46.0)
Lymphs Abs: 1.9 10*3/uL (ref 0.7–4.0)
MCHC: 33.8 g/dL (ref 30.0–36.0)
MCV: 87.3 fl (ref 78.0–100.0)
Monocytes Absolute: 0.4 10*3/uL (ref 0.1–1.0)
Monocytes Relative: 5.5 % (ref 3.0–12.0)
Neutro Abs: 4.9 10*3/uL (ref 1.4–7.7)
Neutrophils Relative %: 64.6 % (ref 43.0–77.0)
Platelets: 158 10*3/uL (ref 150.0–400.0)
RBC: 4.05 Mil/uL (ref 3.87–5.11)
RDW: 13.9 % (ref 11.5–15.5)
WBC: 7.6 10*3/uL (ref 4.0–10.5)

## 2021-07-19 LAB — COMPREHENSIVE METABOLIC PANEL
ALT: 17 U/L (ref 0–35)
AST: 20 U/L (ref 0–37)
Albumin: 4.5 g/dL (ref 3.5–5.2)
Alkaline Phosphatase: 64 U/L (ref 39–117)
BUN: 10 mg/dL (ref 6–23)
CO2: 27 mEq/L (ref 19–32)
Calcium: 9.6 mg/dL (ref 8.4–10.5)
Chloride: 102 mEq/L (ref 96–112)
Creatinine, Ser: 0.86 mg/dL (ref 0.40–1.20)
GFR: 65.62 mL/min (ref >60.00)
Glucose, Bld: 238 mg/dL — ABNORMAL HIGH (ref 70–99)
Potassium: 4 mEq/L (ref 3.5–5.1)
Sodium: 138 mEq/L (ref 135–145)
Total Bilirubin: 0.7 mg/dL (ref 0.2–1.2)
Total Protein: 7.2 g/dL (ref 6.0–8.3)

## 2021-07-19 LAB — HEMOGLOBIN A1C: Hgb A1c MFr Bld: 7.7 % — ABNORMAL HIGH (ref 4.6–6.5)

## 2021-07-19 NOTE — Assessment & Plan Note (Signed)
Here for CPX, I spoke with her daughter [name] extensively about her her chronic medical problems and physical exam. DM: Per Endo, recommend to be seen every few months, discussed with the patient's daughter.  (Addendum, blood sugar elevated, will check A1c) HTN: BP is very good today, continue atenolol, losartan, check labs. Hyperlipidemia: On Lipitor, well-controlled per last FLP. Depression: pt admits to episodic depression but recommend no  change for now. Dementia: On Aricept and Namenda, although I did not perform MMSE, I noted her memory has deteriorated, the patient's daughter agrees. I am concerned about driving, we agreed w/ [name]  that the family will ask her to stop driving and they will help with transportation  to church and going shopping. Osteopenia: Last T score 2019, recheck. Gait disorder: Having more difficulty transferring, we agreed to refer to physical therapy. Right ankle pain: Started few weeks ago, initial x-ray negative, will rex-ray.  Addendum: Patient did not stop for x-ray, recommend [name] to bring her back for the x-ray if pain persists RTC 4- 5   months

## 2021-07-19 NOTE — Assessment & Plan Note (Signed)
--  Td:  2014.  Prescription printed -pneumovax [phone];  prevnar: [phone].  PNM 20 today -Shingles shot 2010; S/p Shingrix x2 - covid vax: Booster recommended - rec a flu shot this fall  -female care:  no h/o abnormal paps, daughter would like her to see the gynecologist again, has a family history of ovarian cancer.  Referred to [facility]. MMG 10-2020 ([facility]) -CCS: FH Colon cancer, Cscope : 04/18/2009, cscope again 04-2014: +polyps, Cscope 07/2019: next?  Daughter would like to consider continue colonoscopies, she is aware it is a major proposition given dementia and aging.  We agreed to refer to GI for consideration. -Labs:  CMP CBC, DEXA -Diet and exercise: Counseled. -- POA on file

## 2021-07-23 ENCOUNTER — Ambulatory Visit (HOSPITAL_BASED_OUTPATIENT_CLINIC_OR_DEPARTMENT_OTHER)
Admission: RE | Admit: 2021-07-23 | Discharge: 2021-07-23 | Disposition: A | Discharge status: Home / Self Care | Payer: Medicare Other | Source: Ambulatory Visit | Attending: Internal Medicine | Admitting: Internal Medicine

## 2021-07-23 ENCOUNTER — Telehealth: Payer: Self-pay | Admitting: Internal Medicine

## 2021-07-23 DIAGNOSIS — S99911A Unspecified injury of right ankle, initial encounter: Secondary | ICD-10-CM | POA: Diagnosis present

## 2021-07-23 DIAGNOSIS — F03B0 Unspecified dementia, moderate, without behavioral disturbance, psychotic disturbance, mood disturbance, and anxiety: Secondary | ICD-10-CM

## 2021-07-23 NOTE — Telephone Encounter (Signed)
Patient's daughter states her mom is having a hard time coming to terms that she should not be driving anymore, so she would like to get a driving test evaluation done. Pt's daughter would like a call back to know if Dr. Larose Kells know what that is and maybe referred her to get one. Please advise.

## 2021-07-23 NOTE — Telephone Encounter (Signed)
We could schedule a road test, do we do through OT? Please find out

## 2021-07-24 NOTE — Telephone Encounter (Signed)
[name]- do you know if OT does this?

## 2021-07-24 NOTE — Telephone Encounter (Signed)
Information given for [facility] at [facility] given. Referral placed. I have asked that they call Pt's daughter [name] to schedule that visit.

## 2021-07-30 NOTE — Telephone Encounter (Signed)
Patient's daughter, [name], requested referral to be sent to [facility] instead of [facility]. Please Attention to : Lorinda Creed. Phone number is [phone] and fax number is [phone].

## 2021-07-30 NOTE — Addendum Note (Signed)
Addended byDamita Dunnings D on: 07/30/2021 11:30 AM   Modules accepted: Orders

## 2021-07-30 NOTE — Telephone Encounter (Signed)
Referral changed

## 2021-08-01 ENCOUNTER — Other Ambulatory Visit: Payer: Self-pay | Admitting: Internal Medicine

## 2021-08-06 ENCOUNTER — Telehealth: Payer: Self-pay

## 2021-08-06 DIAGNOSIS — N950 Postmenopausal bleeding: Secondary | ICD-10-CM

## 2021-08-06 DIAGNOSIS — K760 Fatty (change of) liver, not elsewhere classified: Secondary | ICD-10-CM

## 2021-08-06 DIAGNOSIS — Z79891 Long term (current) use of opiate analgesic: Secondary | ICD-10-CM

## 2021-08-06 DIAGNOSIS — Z7984 Long term (current) use of oral hypoglycemic drugs: Secondary | ICD-10-CM

## 2021-08-06 DIAGNOSIS — F03B4 Unspecified dementia, moderate, with anxiety: Secondary | ICD-10-CM

## 2021-08-06 DIAGNOSIS — M8580 Other specified disorders of bone density and structure, unspecified site: Secondary | ICD-10-CM

## 2021-08-06 DIAGNOSIS — F32A Depression, unspecified: Secondary | ICD-10-CM

## 2021-08-06 DIAGNOSIS — Z86010 Personal history of colonic polyps: Secondary | ICD-10-CM

## 2021-08-06 DIAGNOSIS — K219 Gastro-esophageal reflux disease without esophagitis: Secondary | ICD-10-CM

## 2021-08-06 DIAGNOSIS — G2581 Restless legs syndrome: Secondary | ICD-10-CM

## 2021-08-06 DIAGNOSIS — M19011 Primary osteoarthritis, right shoulder: Secondary | ICD-10-CM | POA: Diagnosis not present

## 2021-08-06 DIAGNOSIS — Z794 Long term (current) use of insulin: Secondary | ICD-10-CM

## 2021-08-06 DIAGNOSIS — E538 Deficiency of other specified B group vitamins: Secondary | ICD-10-CM

## 2021-08-06 DIAGNOSIS — E785 Hyperlipidemia, unspecified: Secondary | ICD-10-CM

## 2021-08-06 DIAGNOSIS — D649 Anemia, unspecified: Secondary | ICD-10-CM | POA: Diagnosis not present

## 2021-08-06 DIAGNOSIS — Z87891 Personal history of nicotine dependence: Secondary | ICD-10-CM

## 2021-08-06 DIAGNOSIS — K589 Irritable bowel syndrome without diarrhea: Secondary | ICD-10-CM

## 2021-08-06 DIAGNOSIS — G4733 Obstructive sleep apnea (adult) (pediatric): Secondary | ICD-10-CM

## 2021-08-06 DIAGNOSIS — K5731 Diverticulosis of large intestine without perforation or abscess with bleeding: Secondary | ICD-10-CM | POA: Diagnosis not present

## 2021-08-06 DIAGNOSIS — Z9181 History of falling: Secondary | ICD-10-CM

## 2021-08-06 DIAGNOSIS — E119 Type 2 diabetes mellitus without complications: Secondary | ICD-10-CM | POA: Diagnosis not present

## 2021-08-06 DIAGNOSIS — F03B3 Unspecified dementia, moderate, with mood disturbance: Secondary | ICD-10-CM

## 2021-08-06 NOTE — Telephone Encounter (Signed)
Plan of care received and faxed to [place] at [phone]. Form sent for scanning.

## 2021-08-08 ENCOUNTER — Telehealth: Payer: Self-pay

## 2021-08-08 NOTE — Telephone Encounter (Signed)
Plan of care signed and faxed back to [place] at [phone]. Form sent for scanning.

## 2021-08-15 ENCOUNTER — Encounter: Payer: Self-pay | Admitting: Gastroenterology

## 2021-08-28 ENCOUNTER — Encounter: Payer: Self-pay | Admitting: Internal Medicine

## 2021-09-12 ENCOUNTER — Other Ambulatory Visit: Payer: Self-pay | Admitting: Internal Medicine

## 2021-09-17 ENCOUNTER — Telehealth: Payer: Self-pay | Admitting: Internal Medicine

## 2021-09-17 NOTE — Telephone Encounter (Signed)
I received a report from OT regards driving abilities, exam was performed 09/04/2021: Patient was deemed unable to drive due to impaired reaction time and coordination. Poor vision. Poor memory. Report to be scanned.

## 2021-09-19 ENCOUNTER — Telehealth: Payer: Self-pay | Admitting: Internal Medicine

## 2021-09-19 NOTE — Telephone Encounter (Signed)
Patient's daughter [name] called to schedule appointment with Dr. Larose Kells to discuss the results of her driving assessment and fill out the dmv form. Daughter would like to see if Dr. Larose Kells would be willing to talk her and her sister apart from her mom. Please advise if appt should be made with two daughters or if he can just call them. Please call [name] to advise at [phone]

## 2021-09-19 NOTE — Telephone Encounter (Signed)
How would you like this scheduled?

## 2021-09-19 NOTE — Telephone Encounter (Signed)
Spoke with [name], we both have read the OT  report which is very conclusive, [place] cannot drive at this point. They will fax me a form to filled up to finalize the removal of her driver's license

## 2021-09-20 NOTE — Telephone Encounter (Signed)
Noted  

## 2021-09-21 ENCOUNTER — Telehealth: Payer: Self-pay

## 2021-09-21 ENCOUNTER — Ambulatory Visit: Payer: Medicare Other | Admitting: Internal Medicine

## 2021-09-21 NOTE — Telephone Encounter (Signed)
OT orders from [facility] received, signed and faxed back to [phone]. Form sent for scanning.

## 2021-10-08 ENCOUNTER — Ambulatory Visit: Payer: Medicare Other | Admitting: Physician Assistant

## 2021-10-11 ENCOUNTER — Other Ambulatory Visit: Payer: Self-pay | Admitting: Internal Medicine

## 2021-11-02 NOTE — Progress Notes (Unsigned)
11/05/2021 [name] [id number] 18-May-1945  Referring provider: Colon Branch, MD Primary GI doctor: Dr. Fuller Plan  ASSESSMENT AND PLAN:   Loose stools/RUQ pain/fatty liver Will check RUQ [place] Add on fiber, squatty potty, information given  Anemia 07/2021 slight anemia, will recheck with iron  History of adenomatous polyp of colon We had a long discussion about the purpose of colon cancer screening and the different methods, including FIT test, Cologuard, virtual colonoscopy, and colonoscopy, including the benefits and their inherent limitations.  With shared decision making we have decided to proceed with the colonoscopy since patient appears to be in good health at this time and with family history of colon cancer, and sheer number of TA polyps.  We have discussed the risks of bleeding, infection, perforation, medication reactions,  and remote risk of death associated with colonoscopy.  All questions were answered and the patient acknowledges these risk and wishes to proceed.  Gastroesophageal reflux disease Lifestyle changes discussed, avoid NSAIDS, ETOH Continue on the same medication, reports symptoms are well controlled.   Moderate dementia without behavioral disturbance, psychotic disturbance, mood disturbance, or anxiety, unspecified dementia type ([place]) She does her ADLS, more short term memory, continue medications  Controlled diabetes mellitus type 2 with complications, unspecified whether long term insulin use ([place]) On insulin, has been more elevated, please keep track of sugars  Patient Care Team: Colon Branch, MD as PCP - General Hyatt, Max T, DPM as Consulting Physician (Podiatry) Karl Luke, MD as Referring Physician (Neurology) Delrae Rend, MD as Consulting Physician (Endocrinology) Macarthur Critchley, [place] as Referring Physician (Optometry) Norma Fredrickson, MD as Consulting Physician (Psychiatry) Aloha Gell, MD as Consulting Physician (Obstetrics and  Gynecology) Janith Lima, MD as Consulting Physician (Urology)  HISTORY OF PRESENT ILLNESS: 76 y.o. female with a past medical history of dementia, depression, type 2 diabetes insulin-dependent, fatty liver, hypertension, hyperlipidemia, IBS, sleep apnea using mouthguard, diverticulosis GERD and personal history of adenomatous polyps and others listed below presents for evaluation for colonoscopy  08/2015 EGD unremarkable no specimens collected. 08/18/2018 CT abdomen pelvis no acute intra-abdominal or pelvic pathology, sigmoid diverticula without inflammation, umbilical hernia, aortic atherosclerosis. 08/04/2019 colonoscopy with Dr. Fuller Plan due to history of adenomatous polyps previous one 5 years ago.  Prep good.  9 tubular adenomatous polyps ranging 6 to 8 mm in size in sigmoid, descending and transverse colon.  Internal hemorrhoids.  Recall 2 years.  [name] daughter is here with her.  Father with colon cancer, mom with lung cancer. Patient with negative genetic testing recently with GYN.  She has some loose stools , can have yellow stools.  No hematochezia or melena. Occ RUQ AB pain, not constant, after eating. Will take Tums.  No trouble swallowing, nausea, vomiting or GERD.  She lives at friends home and has recently stopped driving. She has short term memory issues but still does the majority of her ADL's, goes out with friends and family. No chest pain, no shortness of breath.   She  reports that she quit smoking about 45 years ago. Her smoking use included cigarettes. She has a 20.00 pack-year smoking history. She has never used smokeless tobacco. She reports current alcohol use. She reports that she does not use drugs.  Current Medications:   Current Outpatient Medications (Endocrine & Metabolic):    LEVEMIR 235 UNIT/ML injection, Inject into the skin.   metFORMIN (GLUCOPHAGE-XR) 500 MG 24 hr tablet, TAKE 1 TABLET BY MOUTH EVERY MORNING WITH A MEAL  Current  Outpatient Medications  (Cardiovascular):    atenolol (TENORMIN) 50 MG tablet, Take 25 mg by mouth daily.   atorvastatin (LIPITOR) 40 MG tablet, TAKE 1 TABLET BY MOUTH EVERY DAY   losartan (COZAAR) 25 MG tablet, TAKE 1 TABLET BY MOUTH EVERY EVENING  Current Outpatient Medications (Respiratory):    azelastine (ASTELIN) 0.1 % nasal spray, Place 2 sprays into both nostrils at bedtime as needed for rhinitis. Use in each nostril as directed  Current Outpatient Medications (Analgesics):    acetaminophen (TYLENOL) 325 MG tablet, Take 650 mg by mouth every 6 (six) hours as needed for mild pain or headache.   traMADol (ULTRAM) 50 MG tablet, TAKE 1 TABLET BY MOUTH EVERY TWELVE HOURS AS NEEDED (Patient not taking: Reported on 01/17/2021)  Current Outpatient Medications (Hematological):    vitamin B-12 (CYANOCOBALAMIN) 1000 MCG tablet, TAKE 1 TABLET BY MOUTH EVERY DAY   VITRON-C 65-125 MG TABS, TAKE 1 TABLET BY MOUTH EVERY DAY  Current Outpatient Medications (Other):    ACCU-CHEK FASTCLIX LANCETS MISC, daily. use as directed   BD VEO INSULIN SYRINGE U/F 31G X 15/64" 1 ML MISC, USE TO INJECT insulin EVERY DAY   donepezil (ARICEPT) 10 MG tablet, Take 2 tablets (20 mg total) by mouth at bedtime.   gabapentin (NEURONTIN) 100 MG capsule, Take 1 capsule (100 mg total) by mouth at bedtime.   memantine (NAMENDA) 10 MG tablet, Take 1 tablet (10 mg total) by mouth 2 (two) times daily.   mirabegron ER (MYRBETRIQ) 50 MG TB24 tablet, Take 1 tablet by mouth daily.   Multiple Vitamins-Minerals (CENTRUM SILVER ULTRA WOMENS) TABS, TAKE 1 TABLET BY MOUTH EVERY DAY   ONE TOUCH ULTRA TEST test strip, USE TO CHECK BLOOD SUGAR EVERY DAY   pantoprazole (PROTONIX) 40 MG tablet, TAKE 1 TABLET BY MOUTH 2 TIMES DAILY BEFORE A meal   sertraline (ZOLOFT) 100 MG tablet, TAKE 1 TABLET BY MOUTH EVERY DAY   sodium fluoride (FLUORISHIELD) 1.1 % GEL dental gel, Place 1 application onto teeth at bedtime as needed (for teeth).   Medical History:  Past Medical  History:  Diagnosis Date   Adenomatous colon polyp 03/1988   Anemia    borderline   Arthritis    R shoulder - degenerative    Cataract    beginning stages   Chest pain     Sept 2011: stress test neg   Depression    sees Dr.Cotle   Diabetes mellitus    dr Buddy Duty   Diverticulosis    Fatty liver    Increased LFTs, saw GI 06/2011, likely from fatty liver    GERD (gastroesophageal reflux disease)    History of hiatal hernia    Hyperlipemia    Hypertension    IBS (irritable bowel syndrome)    and dyspepsia   Memory impairment 08/2014   mild cognitive impairment vs. mild dementia, reommended reinstating of cholinesterase inhibitor    Osteopenia    dexa 06/2007 and 10/11 Rx CA vitamin d   RLS (restless legs syndrome)    h/o- no longer using Requip   Sleep apnea    Mouth guard use at home   Thyroid nodule    Allergies:  Allergies  Allergen Reactions   Sulfa Antibiotics Nausea Only   Trulicity [Dulaglutide] Nausea Only   Penicillins Rash and Other (See Comments)    Has patient had a PCN reaction causing immediate rash, facial/tongue/throat swelling, SOB or lightheadedness with hypotension: No Has patient had a PCN reaction causing severe rash  involving mucus membranes or skin necrosis: No Has patient had a PCN reaction that required hospitalization No Has patient had a PCN reaction occurring within the last 10 years: No If all of the above answers are "NO", then may proceed with Cephalosporin use.     Surgical History:  She  has a past surgical history that includes Uterine fibroid embolization; Tonsillectomy; Polypectomy; Reverse shoulder arthroplasty (Right, 04/21/2014); Breast biopsy (Left, 10/11/2015); and Colonoscopy (04/15/2014). Family History:  Her family history includes Colon cancer in her father; Diabetes in an other family member; Heart attack in her maternal grandmother; Lung cancer in her mother; Other in an other family member; Ovarian cancer in her paternal aunt;  Stroke in an other family member.  REVIEW OF SYSTEMS  : All other systems reviewed and negative except where noted in the History of Present Illness.  PHYSICAL EXAM: BP 118/62   Pulse 66   Ht '5\' 5"'$  (1.651 m)   Wt 184 lb 2 oz (83.5 kg)   BMI 30.64 kg/m  General:   Pleasant, well developed female in no acute distress Head:   Normocephalic and atraumatic. Eyes:  sclerae anicteric,conjunctive pink  Heart:   regular rate and rhythm Pulm:  Clear anteriorly; no wheezing Abdomen:   Soft, Obese AB, Active bowel sounds. mild tenderness in the RUQ. Without guarding and Without rebound, No organomegaly appreciated. Rectal: Not evaluated Extremities:  Without edema. Msk: Symmetrical without gross deformities. Peripheral pulses intact.  Neurologic:  Alert and  oriented x4;  No focal deficits.  Skin:   Dry and intact without significant lesions or rashes. Psychiatric:  Cooperative. Normal mood and affect.    Vladimir Crofts, PA-C 10:18 AM

## 2021-11-05 ENCOUNTER — Encounter: Payer: Self-pay | Admitting: Physician Assistant

## 2021-11-05 ENCOUNTER — Other Ambulatory Visit (INDEPENDENT_AMBULATORY_CARE_PROVIDER_SITE_OTHER): Payer: Medicare Other

## 2021-11-05 ENCOUNTER — Ambulatory Visit (INDEPENDENT_AMBULATORY_CARE_PROVIDER_SITE_OTHER): Payer: Medicare Other | Admitting: Physician Assistant

## 2021-11-05 VITALS — BP 118/62 | HR 66 | Ht 65.0 in | Wt 184.1 lb

## 2021-11-05 DIAGNOSIS — Z86010 Personal history of colonic polyps: Secondary | ICD-10-CM | POA: Diagnosis not present

## 2021-11-05 DIAGNOSIS — K760 Fatty (change of) liver, not elsewhere classified: Secondary | ICD-10-CM

## 2021-11-05 DIAGNOSIS — R195 Other fecal abnormalities: Secondary | ICD-10-CM | POA: Diagnosis not present

## 2021-11-05 DIAGNOSIS — D649 Anemia, unspecified: Secondary | ICD-10-CM

## 2021-11-05 DIAGNOSIS — E118 Type 2 diabetes mellitus with unspecified complications: Secondary | ICD-10-CM

## 2021-11-05 DIAGNOSIS — F03B0 Unspecified dementia, moderate, without behavioral disturbance, psychotic disturbance, mood disturbance, and anxiety: Secondary | ICD-10-CM

## 2021-11-05 DIAGNOSIS — K219 Gastro-esophageal reflux disease without esophagitis: Secondary | ICD-10-CM | POA: Diagnosis not present

## 2021-11-05 DIAGNOSIS — R1011 Right upper quadrant pain: Secondary | ICD-10-CM

## 2021-11-05 LAB — CBC WITH DIFFERENTIAL/PLATELET
Basophils Absolute: 0 10*3/uL (ref 0.0–0.1)
Basophils Relative: 0.4 % (ref 0.0–3.0)
Eosinophils Absolute: 0.2 10*3/uL (ref 0.0–0.7)
Eosinophils Relative: 2.6 % (ref 0.0–5.0)
HCT: 39.6 % (ref 36.0–46.0)
Hemoglobin: 13.4 g/dL (ref 12.0–15.0)
Lymphocytes Relative: 26.9 % (ref 12.0–46.0)
Lymphs Abs: 1.8 10*3/uL (ref 0.7–4.0)
MCHC: 33.8 g/dL (ref 30.0–36.0)
MCV: 86.2 fl (ref 78.0–100.0)
Monocytes Absolute: 0.4 10*3/uL (ref 0.1–1.0)
Monocytes Relative: 6.2 % (ref 3.0–12.0)
Neutro Abs: 4.4 10*3/uL (ref 1.4–7.7)
Neutrophils Relative %: 63.9 % (ref 43.0–77.0)
Platelets: 163 10*3/uL (ref 150.0–400.0)
RBC: 4.6 Mil/uL (ref 3.87–5.11)
RDW: 13.6 % (ref 11.5–15.5)
WBC: 6.9 10*3/uL (ref 4.0–10.5)

## 2021-11-05 LAB — COMPREHENSIVE METABOLIC PANEL
ALT: 20 U/L (ref 0–35)
AST: 26 U/L (ref 0–37)
Albumin: 4.6 g/dL (ref 3.5–5.2)
Alkaline Phosphatase: 67 U/L (ref 39–117)
BUN: 12 mg/dL (ref 6–23)
CO2: 29 mEq/L (ref 19–32)
Calcium: 10 mg/dL (ref 8.4–10.5)
Chloride: 99 mEq/L (ref 96–112)
Creatinine, Ser: 0.78 mg/dL (ref 0.40–1.20)
GFR: 73.63 mL/min (ref >60.00)
Glucose, Bld: 171 mg/dL — ABNORMAL HIGH (ref 70–99)
Potassium: 4.4 mEq/L (ref 3.5–5.1)
Sodium: 138 mEq/L (ref 135–145)
Total Bilirubin: 1.3 mg/dL — ABNORMAL HIGH (ref 0.2–1.2)
Total Protein: 7.9 g/dL (ref 6.0–8.3)

## 2021-11-05 LAB — IBC + FERRITIN
Ferritin: 152 ng/mL (ref 10.0–291.0)
Iron: 86 ug/dL (ref 42–145)
Saturation Ratios: 23.4 % (ref 20.0–50.0)
TIBC: 368.2 ug/dL (ref 250.0–450.0)
Transferrin: 263 mg/dL (ref 212.0–360.0)

## 2021-11-05 MED ORDER — NA SULFATE-K SULFATE-MG SULF 17.5-3.13-1.6 GM/177ML PO SOLN
1.0000 | Freq: Once | ORAL | 0 refills | Status: AC
Start: 2021-11-05 — End: 2021-11-05

## 2021-11-05 NOTE — Patient Instructions (Signed)
_______________________________________________________  If you are age 76 or older, your body mass index should be between 23-30. Your Body mass index is 30.64 kg/m. If this is out of the aforementioned range listed, please consider follow up with your Primary Care Provider.  If you are age 19 or younger, your body mass index should be between 19-25. Your Body mass index is 30.64 kg/m. If this is out of the aformentioned range listed, please consider follow up with your Primary Care Provider.   ________________________________________________________  The [place] [facility] providers would like to encourage you to use [facility] to communicate with providers for non-urgent requests or questions.  Due to long hold times on the telephone, sending your provider a message by [facility] may be a faster and more efficient way to get a response.  Please allow 48 business hours for a response.  Please remember that this is for non-urgent requests.  _______________________________________________________  [name] have been scheduled for an abdominal ultrasound at [facility] (1st floor of hospital) on 11-09-2021 at 1130am. Please arrive 15 minutes prior to your appointment for registration. Make certain not to have anything to eat or drink midnight prior to your appointment. Should you need to reschedule your appointment, please contact radiology at [phone]. This test typically takes about 30 minutes to perform.  Your provider has requested that you go to the basement level for lab work before leaving today. Press "B" on the elevator. The lab is located at the first door on the left as you exit the elevator.  We have sent the following medications to your pharmacy for you to pick up at your convenience: [place] starting on a fiber supplement, can try metamucil first but if this causes gas/bloating switch to benefiber or citracel, these do not cause gas.  Take with fiber with with a full 8 oz glass  of water once a day. This can take 1 month to start helping, so try for at least one month.  Recommend increasing water and physical activity.   - Drink at least 64-80 ounces of water/liquid per day. - Establish a time to try to move your bowels every day.  For many people, this is after a cup of coffee or after a meal such as breakfast. - Sit all of the way back on the toilet keeping your back fairly straight and while sitting up, try to rest the tops of your forearms on your upper thighs.   - Raising your feet with a step stool/squatty potty can be helpful to improve the angle that allows your stool to pass through the rectum. - Relax the rectum feeling it bulge toward the toilet water.  If you feel your rectum raising toward your body, you are contracting rather than relaxing. - Breathe in and slowly exhale. "Belly breath" by expanding your belly towards your belly button. Keep belly expanded as you gently direct pressure down and back to the anus.  A low pitched GRRR sound can assist with increasing intra-abdominal pressure.  - Repeat 3-4 times. If unsuccessful, contract the pelvic floor to restore normal tone and get off the toilet.  Avoid excessive straining. - To reduce excessive wiping by teaching your anus to normally contract, place hands on outer aspect of knees and resist knee movement outward.  Hold 5-10 second then place hands just inside of knees and resist inward movement of knees.  Hold 5 seconds.  Repeat a few times each way.  ORAL DIABETIC MEDICATION INSTRUCTIONS (Metformin, Glipizide, Glimepiride, [place],  Jardiance, Rybelsus, Janumet, Januvia, Xigduo,Synjardy)   The day before your procedure:  Take your diabetic pill as you do normally  The day of your procedure:  Do not take your diabetic pill   We will check your blood sugar levels during the admission process and again in Recovery before discharging you  home  _____________________________________________________________________________  INSULIN (LONG ACTING) MEDICATION INSTRUCTIONS (Lantus, Levemir, Basaglar, NPH, 70/30, Humulin, Novolin-N, Toujeo,Tresiba, [place], [place])  The day before your procedure:  Take  your regular daily dose   The day of your procedure:  Do not take your morning dose We will check your blood sugar levels during the admission process and again in Recovery before discharging you home    INSULIN (SHORT ACTING) MEDICATION INSTRUCTIONS (Regular, Humulog, Novolog,Novolin-R)  The day before your procedure:  Do not take your evening dose  The day of your procedure:  Do not take your morning dose We will check your blood sugar levels during the admission process and again in Recovery before discharging you home     INSULIN PUMP MEDICATION INSTRUCTIONS  We will contact the physician managing your diabetic care for written dosage instructions for the day before your procedure and the day of your procedure.  Once we have received the instructions, we will contact you. Call our office 1 week prior to your exam if you have not heard from [place]. ____________________________________________________________________________  ONCE A WEEK INJECTIONS Trulicity/ Ozempic/ Mounjaro/ Wegovy - DO NOT TAKE three days prior to the procedure  _____________________________________________________________________________  ONCE A DAY INJECTIONS Byetta,Victoza,Saxenda,Adlyxin- DO NOT TAKE 2 days prior to the procedure    It was a pleasure to see you today!  Thank you for trusting me with your gastrointestinal care!

## 2021-11-07 ENCOUNTER — Other Ambulatory Visit: Payer: Self-pay | Admitting: Internal Medicine

## 2021-11-07 ENCOUNTER — Ambulatory Visit (INDEPENDENT_AMBULATORY_CARE_PROVIDER_SITE_OTHER): Payer: Medicare Other | Admitting: Internal Medicine

## 2021-11-07 ENCOUNTER — Ambulatory Visit
Admission: RE | Admit: 2021-11-07 | Discharge: 2021-11-07 | Disposition: A | Discharge status: Home / Self Care | Payer: Medicare Other | Source: Ambulatory Visit | Attending: Internal Medicine | Admitting: Internal Medicine

## 2021-11-07 ENCOUNTER — Encounter: Payer: Self-pay | Admitting: Internal Medicine

## 2021-11-07 VITALS — BP 126/82 | HR 60 | Temp 97.8°F | Resp 18 | Ht 65.0 in | Wt 187.0 lb

## 2021-11-07 DIAGNOSIS — H9319 Tinnitus, unspecified ear: Secondary | ICD-10-CM

## 2021-11-07 DIAGNOSIS — Z1231 Encounter for screening mammogram for malignant neoplasm of breast: Secondary | ICD-10-CM

## 2021-11-07 DIAGNOSIS — R600 Localized edema: Secondary | ICD-10-CM | POA: Diagnosis not present

## 2021-11-07 DIAGNOSIS — F03B0 Unspecified dementia, moderate, without behavioral disturbance, psychotic disturbance, mood disturbance, and anxiety: Secondary | ICD-10-CM

## 2021-11-07 NOTE — Patient Instructions (Addendum)
Vaccines I recommend:  Covid booster Flu shot if not done so far   Come back in 2-3 months, please make an appointment   Per our records you are due for your diabetic eye exam. Please contact your eye doctor to schedule an appointment. Please have them send copies of your office visit notes to [place]. Our fax number is [phone]. If you need a referral to an eye doctor please let us know.

## 2021-11-07 NOTE — Progress Notes (Unsigned)
Subjective:    Patient ID: [name], female    DOB: [birth year], 76 y.o.   MRN: [id number]  DOS:  11/07/2021 Type of visit - description: Follow-up, here with your daughter [name]  The patient has several concerns about not being able to drive. Also has noticed swelling in the legs. Also have chronic tinnitus on the left, would like that ear to be checked. Denies HOH, pain or discharge.  Review of Systems See above   Past Medical History:  Diagnosis Date   Adenomatous colon polyp 03/1988   Anemia    borderline   Arthritis    R shoulder - degenerative    Cataract    beginning stages   Chest pain     Sept 2011: stress test neg   Depression    sees Dr.Cotle   Diabetes mellitus    dr Buddy Duty   Diverticulosis    Fatty liver    Increased LFTs, saw GI 06/2011, likely from fatty liver    GERD (gastroesophageal reflux disease)    History of hiatal hernia    Hyperlipemia    Hypertension    IBS (irritable bowel syndrome)    and dyspepsia   Memory impairment 08/2014   mild cognitive impairment vs. mild dementia, reommended reinstating of cholinesterase inhibitor    Osteopenia    dexa 06/2007 and 10/11 Rx CA vitamin d   RLS (restless legs syndrome)    h/o- no longer using Requip   Sleep apnea    Mouth guard use at home   Thyroid nodule     Past Surgical History:  Procedure Laterality Date   BREAST BIOPSY Left 10/11/2015   fibroadenoma w/ calcifications, no evidence of malignancy, recommend mammogram repeat-6 mnths   COLONOSCOPY  04/15/2014   POLYPECTOMY     REVERSE SHOULDER ARTHROPLASTY Right 04/21/2014   Procedure: REVERSE SHOULDER ARTHROPLASTY;  Surgeon: Tania Ade, MD;  Location: [facility];  Service: Orthopedics;  Laterality: Right;  Right reverse total shoulder replacement   TONSILLECTOMY     UTERINE FIBROID EMBOLIZATION     90s    Current Outpatient Medications  Medication Instructions   ACCU-CHEK FASTCLIX LANCETS MISC Daily, use as directed   acetaminophen  (TYLENOL) 650 mg, Oral, Every 6 hours PRN   atenolol (TENORMIN) 25 mg, Oral, Daily   atorvastatin (LIPITOR) 40 MG tablet TAKE 1 TABLET BY MOUTH EVERY DAY   azelastine (ASTELIN) 0.1 % nasal spray 2 sprays, Each Nare, At bedtime PRN, Use in each nostril as directed   BD VEO INSULIN SYRINGE U/F 31G X 15/64" 1 ML MISC USE TO INJECT insulin EVERY DAY   donepezil (ARICEPT) 20 mg, Oral, Daily at bedtime   gabapentin (NEURONTIN) 100 mg, Oral, Daily at bedtime   LEVEMIR 100 UNIT/ML injection Subcutaneous   losartan (COZAAR) 25 MG tablet TAKE 1 TABLET BY MOUTH EVERY EVENING   memantine (NAMENDA) 10 mg, Oral, 2 times daily   metFORMIN (GLUCOPHAGE-XR) 500 MG 24 hr tablet TAKE 1 TABLET BY MOUTH EVERY MORNING WITH A MEAL   mirabegron ER (MYRBETRIQ) 50 MG TB24 tablet 1 tablet, Oral, Daily   Multiple Vitamins-Minerals (CENTRUM SILVER ULTRA WOMENS) TABS TAKE 1 TABLET BY MOUTH EVERY DAY   ONE TOUCH ULTRA TEST test strip USE TO CHECK BLOOD SUGAR EVERY DAY   pantoprazole (PROTONIX) 40 MG tablet TAKE 1 TABLET BY MOUTH 2 TIMES DAILY BEFORE A meal   sertraline (ZOLOFT) 100 mg, Oral, Daily   sodium fluoride (FLUORISHIELD) 1.1 % GEL dental gel  1 application , dental, At bedtime PRN   traMADol (ULTRAM) 50 MG tablet TAKE 1 TABLET BY MOUTH EVERY TWELVE HOURS AS NEEDED   vitamin B-12 (CYANOCOBALAMIN) 1000 MCG tablet TAKE 1 TABLET BY MOUTH EVERY DAY   VITRON-C 65-125 MG TABS TAKE 1 TABLET BY MOUTH EVERY DAY       Objective:   Physical Exam BP 126/82   Pulse 60   Temp 97.8 F (36.6 C) (Oral)   Resp 18   Ht '5\' 5"'$  (1.651 m)   Wt 187 lb (84.8 kg)   SpO2 98%   BMI 31.12 kg/m  General:   Well developed, NAD, BMI noted. HEENT:  Normocephalic . Face symmetric, atraumatic Ears: Normal bilaterally.  TMs normal. Lower extremities: no pretibial pitting edema on either side.  She does have a very faint mark a sock. Skin: Not pale. Not jaundice Neurologic:  alert & cooperative, pleasantly demented.   Forgetful. Speech normal, gait appropriate for age and unassisted Psych--   Behavior appropriate. No anxious or depressed appearing.      Assessment     ASSESSMENT DM Dr. Buddy Duty HTN Hyperlipidemia H/o SIADH Depression seen elsewhere Mild cognitive impairment  MMSE 2015 --> 28, on Aricept, namenda added 04-2016 by Dr Everette Rank  OSA -- mild, also told RLS (Rx requip); saw Dr Gwenette Greet 2014, no CPAP GI: --IBS, colon polyps, diverticulosis, hiatal hernia --Chronic constipation likely part of her IBS syndrome  --Fatty liver >>> GI eval 2013 --iron fec anemia:  cscope 04-2014 : 2 polyps; + hemocult @ GI office 10-2014: EGD done (-), bx neg Osteopenia: T score -1.1  2009 , osteopenia again per dexa 05-2013, t score -1.8 (10-09-17), scanned, rx ca-vit d B12 deficiency  RLS MSK: --DJD frozen shoulder Dizziness: chronic, carotid US 2016 neg, saw cards-- not likely CV related; saw neuro DR Everette Rank 02-2015  Thyroid nodules: Incidental   by Carotid US, BX 11-2014: Atypical findings, [place] III, f/u  Endocrine RLL Lung nodule: Stable per CT 09-2016, no need for further B/B incontinence:  uses diapers  Chest pain 09-2009, negative stress test   PLAN: Dementia: Had a formal OT evaluation, she was determined that unable to drive.  The patient asked my opinion, I agree with the OT evaluation, risk for herself in the community even in short drives was described to the patient, encouraged to use transportation provided by her assisted living facility.  She felt relief after she is here in my opinion. Edema?  No volume overload or pitting edema found.  She does have a very mild sock mark. Tinnitus: Has a history of tinnitus in the left ear for years, exam is negative, denies HOH.  Recommend observation Preventive care: Wonders if a COVID booster is appropriate, 78s. She is not sure if she had a flu shot, recommend to have a flu shot this season.  Needs to check with her assisted living facility. RTC 3 months  checkup   Saw GI 11/05/2021, for anemia, a CBC was repeated along with a iron panel: All normal. They had extensive conversation about history of colon polyps and they agree on a colonoscopy.    7-- Here for CPX, I spoke with her daughter [name] extensively about her her chronic medical problems and physical exam. DM: Per Endo, recommend to be seen every few months, discussed with the patient's daughter.  (Addendum, blood sugar elevated, will check A1c) HTN: BP is very good today, continue atenolol, losartan, check labs. Hyperlipidemia: On Lipitor, well-controlled per last FLP. Depression:  pt admits to episodic depression but recommend no  change for now. Dementia: On Aricept and Namenda, although I did not perform MMSE, I noted her memory has deteriorated, the patient's daughter agrees. I am concerned about driving, we agreed w/ [name]  that the family will ask her to stop driving and they will help with transportation  to church and going shopping. Osteopenia: Last T score 2019, recheck. Gait disorder: Having more difficulty transferring, we agreed to refer to physical therapy. Right ankle pain: Started few weeks ago, initial x-ray negative, will rex-ray.  Addendum: Patient did not stop for x-ray, recommend [name] to bring her back for the x-ray if pain persists RTC 4- 5   months

## 2021-11-08 NOTE — Assessment & Plan Note (Signed)
Dementia: Had a formal OT evaluation, she was determined is unable to drive.  The patient asked my opinion, I agree with the OT evaluation, risk for herself & the community even in short drives was described to the patient, encouraged to use transportation provided by her assisted living facility.  She felt relief after she heard my opinion. Edema?  No volume overload or pitting edema found.  She does have a very mild sock mark. Tinnitus: Has a history of tinnitus in the left ear for years, exam is negative, denies HOH.  Recommend observation Preventive care: Wonders if a COVID booster is appropriate >> yes . She is not sure if she had a flu shot, recommend to have a flu shot this season.  Needs to check with her assisted living facility. Saw GI 11/05/2021, for anemia, a CBC was repeated along with a iron panel: All normal. They had extensive conversation about history of colon polyps and they agree on a colonoscopy. RTC 3 months checkup

## 2021-11-09 ENCOUNTER — Ambulatory Visit (HOSPITAL_COMMUNITY): Payer: Medicare Other

## 2021-11-14 ENCOUNTER — Ambulatory Visit (HOSPITAL_COMMUNITY)
Admission: RE | Admit: 2021-11-14 | Discharge: 2021-11-14 | Disposition: A | Discharge status: Home / Self Care | Payer: Medicare Other | Source: Ambulatory Visit | Attending: Physician Assistant | Admitting: Physician Assistant

## 2021-11-14 DIAGNOSIS — R1011 Right upper quadrant pain: Secondary | ICD-10-CM | POA: Diagnosis present

## 2021-11-14 DIAGNOSIS — R195 Other fecal abnormalities: Secondary | ICD-10-CM | POA: Diagnosis present

## 2021-11-21 ENCOUNTER — Ambulatory Visit: Payer: Medicare Other | Admitting: Internal Medicine

## 2021-11-21 ENCOUNTER — Telehealth: Payer: Self-pay

## 2021-11-21 LAB — HM DIABETES EYE EXAM

## 2021-11-21 NOTE — Telephone Encounter (Signed)
OT Plan of care signed and faxed back to [place] at [phone]. Form sent for scanning.

## 2021-11-30 ENCOUNTER — Other Ambulatory Visit: Payer: Self-pay | Admitting: Internal Medicine

## 2021-12-07 ENCOUNTER — Encounter: Payer: Self-pay | Admitting: Gastroenterology

## 2021-12-17 ENCOUNTER — Encounter: Payer: Medicare Other | Admitting: Gastroenterology

## 2021-12-27 ENCOUNTER — Telehealth: Payer: Self-pay | Admitting: Internal Medicine

## 2021-12-27 NOTE — Telephone Encounter (Signed)
Pt stated she saw on mychart that she was due for her t-dap and would like to get that done.

## 2021-12-27 NOTE — Telephone Encounter (Signed)
LMOM for [name], Pt's daughter- informed that okay for Pt to have Tdap vaccine but will need to be completed at her pharmacy.

## 2021-12-28 ENCOUNTER — Other Ambulatory Visit: Payer: Self-pay | Admitting: Internal Medicine

## 2022-01-02 ENCOUNTER — Telehealth: Payer: Self-pay | Admitting: Internal Medicine

## 2022-01-02 NOTE — Telephone Encounter (Signed)
Patient requesting Tdap  Please advise

## 2022-01-02 NOTE — Telephone Encounter (Signed)
December 27, 2021 Me      12/27/21  2:56 PM Note LMOM for [name], Pt's daughter- informed that okay for Pt to have Tdap vaccine but will need to be completed at her pharmacy.

## 2022-01-14 ENCOUNTER — Encounter: Payer: Self-pay | Admitting: Gastroenterology

## 2022-01-14 ENCOUNTER — Ambulatory Visit (AMBULATORY_SURGERY_CENTER): Payer: Medicare Other | Admitting: *Deleted

## 2022-01-14 VITALS — Ht 65.0 in | Wt 185.0 lb

## 2022-01-14 DIAGNOSIS — Z86010 Personal history of colonic polyps: Secondary | ICD-10-CM

## 2022-01-14 DIAGNOSIS — Z800 Family history of malignant neoplasm of digestive organs: Secondary | ICD-10-CM

## 2022-01-14 NOTE — Progress Notes (Signed)
No egg or soy allergy known to patient  No issues known to pt with past sedation with any surgeries or procedures Patient denies ever being told they had issues or difficulty with intubation  No FH of Malignant Hyperthermia Pt is not on diet pills Pt is not on  home 02  Pt is not on blood thinners  Pt denies issues with constipation  No A fib or A flutter Have any cardiac testing pending--NO Pt instructed to use [email or web address] or GoodRx for a price reduction on prep     Daughter already has suprep at home for pt. Patient's chart reviewed by Osvaldo Angst CNRA prior to previsit and patient appropriate for the [facility].  Previsit completed and red dot placed by patient's name on their procedure day (on provider's schedule).

## 2022-01-18 ENCOUNTER — Telehealth: Payer: Self-pay

## 2022-01-18 NOTE — Telephone Encounter (Signed)
Plan of care signed and faxed back to [facility] at [phone]. Form sent for scanning.

## 2022-01-24 LAB — VITAMIN B12: Vitamin B-12: 1167

## 2022-01-24 LAB — HM DIABETES FOOT EXAM

## 2022-01-24 LAB — BASIC METABOLIC PANEL: Sodium: 138 (ref 137–147)

## 2022-01-24 LAB — HEMOGLOBIN A1C: Hemoglobin A1C: 7.3

## 2022-01-24 LAB — TSH: TSH: 0.26 — AB (ref 0.41–5.90)

## 2022-01-25 ENCOUNTER — Encounter: Payer: Self-pay | Admitting: Internal Medicine

## 2022-01-28 ENCOUNTER — Encounter: Payer: Self-pay | Admitting: Internal Medicine

## 2022-01-28 ENCOUNTER — Ambulatory Visit (INDEPENDENT_AMBULATORY_CARE_PROVIDER_SITE_OTHER): Payer: Medicare Other | Admitting: Internal Medicine

## 2022-01-28 VITALS — BP 132/84 | HR 53 | Temp 98.1°F | Resp 18 | Ht 65.0 in | Wt 186.0 lb

## 2022-01-28 DIAGNOSIS — H9319 Tinnitus, unspecified ear: Secondary | ICD-10-CM | POA: Diagnosis not present

## 2022-01-28 NOTE — Assessment & Plan Note (Signed)
Tinnitus: Chronic issue, patient thinks (is not sure) that Dr. Buddy Duty referred her to ENT.  That would be okay although I do not see any red flags. For now rec observation, she could use music background during the daytime if tinnitus is bothersome. RTC already scheduled for a routine visit next week

## 2022-01-28 NOTE — Progress Notes (Signed)
Subjective:    Patient ID: [name], female    DOB: 1945/08/20, 77 y.o.   MRN: [id number]  DOS:  01/28/2022 Type of visit - description: Acute, here with a caregiver from "[facility]".  Main concern is tinnitus, left-sided.  This is chronic, intensity fluctuates but seems more noticeable in the last few months.  She denies decreased hearing, headaches, ear pain or discharge.  Review of Systems See above   Past Medical History:  Diagnosis Date   Adenomatous colon polyp 03/1988   Anemia    borderline   Anxiety    Arthritis    R shoulder - degenerative    Chest pain     Sept 2011: stress test neg   Depression    sees Dr.Cotle   Diabetes mellitus    dr Buddy Duty   Diverticulosis    Fatty liver    Increased LFTs, saw GI 06/2011, likely from fatty liver    GERD (gastroesophageal reflux disease)    History of hiatal hernia    Hyperlipemia    Hypertension    IBS (irritable bowel syndrome)    and dyspepsia   Memory impairment 08/2014   mild cognitive impairment vs. mild dementia, reommended reinstating of cholinesterase inhibitor    Osteopenia    dexa 06/2007 and 10/11 Rx CA vitamin d   Osteopenia    RLS (restless legs syndrome)    h/o- no longer using Requip   Sleep apnea    Mouth guard use at home   Thyroid nodule     Past Surgical History:  Procedure Laterality Date   BREAST BIOPSY Left 10/11/2015   fibroadenoma w/ calcifications, no evidence of malignancy, recommend mammogram repeat-6 mnths   COLONOSCOPY  04/15/2014   POLYPECTOMY     REVERSE SHOULDER ARTHROPLASTY Right 04/21/2014   Procedure: REVERSE SHOULDER ARTHROPLASTY;  Surgeon: Tania Ade, MD;  Location: [facility];  Service: Orthopedics;  Laterality: Right;  Right reverse total shoulder replacement   TONSILLECTOMY     UTERINE FIBROID EMBOLIZATION     90s    Current Outpatient Medications  Medication Instructions   ACCU-CHEK FASTCLIX LANCETS MISC Daily, use as directed   acetaminophen (TYLENOL)  650 mg, Oral, Every 6 hours PRN   atenolol (TENORMIN) 25 mg, Oral, Daily   atorvastatin (LIPITOR) 40 MG tablet TAKE 1 TABLET BY MOUTH EVERY DAY   azelastine (ASTELIN) 0.1 % nasal spray 2 sprays, Each Nare, At bedtime PRN, Use in each nostril as directed   BD VEO INSULIN SYRINGE U/F 31G X 15/64" 1 ML MISC USE TO INJECT insulin EVERY DAY   donepezil (ARICEPT) 20 mg, Oral, Daily at bedtime   gabapentin (NEURONTIN) 100 mg, Oral, Daily at bedtime   LEVEMIR 100 UNIT/ML injection Subcutaneous   losartan (COZAAR) 25 MG tablet TAKE 1 TABLET BY MOUTH EVERY EVENING   memantine (NAMENDA) 10 mg, Oral, 2 times daily   metFORMIN (GLUCOPHAGE-XR) 500 MG 24 hr tablet TAKE 1 TABLET BY MOUTH EVERY MORNING WITH A MEAL   mirabegron ER (MYRBETRIQ) 50 MG TB24 tablet 1 tablet, Oral, Daily   Multiple Vitamins-Minerals (CENTRUM SILVER ULTRA WOMENS) TABS TAKE 1 TABLET BY MOUTH EVERY DAY   ONE TOUCH ULTRA TEST test strip USE TO CHECK BLOOD SUGAR EVERY DAY   pantoprazole (PROTONIX) 40 MG tablet TAKE 1 TABLET BY MOUTH 2 TIMES DAILY BEFORE A MEAL   sertraline (ZOLOFT) 100 mg, Oral, Daily   sodium fluoride (FLUORISHIELD) 1.1 % GEL dental gel 1 application , dental, At  bedtime PRN   traMADol (ULTRAM) 50 MG tablet TAKE 1 TABLET BY MOUTH EVERY TWELVE HOURS AS NEEDED   vitamin B-12 (CYANOCOBALAMIN) 1000 MCG tablet TAKE 1 TABLET BY MOUTH EVERY DAY   VITRON-C 65-125 MG TABS TAKE 1 TABLET BY MOUTH EVERY DAY       Objective:   Physical Exam BP 132/84   Pulse (!) 53   Temp 98.1 F (36.7 C) (Oral)   Resp 18   Ht '5\' 5"'$  (1.651 m)   Wt 186 lb (84.4 kg)   SpO2 97%   BMI 30.95 kg/m  General:   Well developed, NAD, BMI noted. HEENT:  Normocephalic . Face symmetric, atraumatic. TMs normal. No obvious HOH. Lungs:  CTA B Normal respiratory effort, no intercostal retractions, no accessory muscle use. Heart: RRR,  no murmur.  Lower extremities: no pretibial edema bilaterally  Skin: Not pale. Not jaundice Neurologic:   alert & speech normal, gait appropriate for age and unassisted Psych--  Behavior appropriate. No anxious or depressed appearing.      Assessment    ASSESSMENT DM Dr. Buddy Duty HTN Hyperlipidemia H/o SIADH Depression seen elsewhere Mild cognitive impairment  MMSE 2015 --> 28, on Aricept, namenda added 04-2016 by Dr Everette Rank  OSA -- mild, also told RLS (Rx requip); saw Dr Gwenette Greet 2014, no CPAP GI: --IBS, colon polyps, diverticulosis, hiatal hernia --Chronic constipation likely part of her IBS syndrome  --Fatty liver >>> GI eval 2013 --iron fec anemia:  cscope 04-2014 : 2 polyps; + hemocult @ GI office 10-2014: EGD done (-), bx neg Osteopenia: T score -1.1  2009 , osteopenia again per dexa 05-2013, t score -1.8 (10-09-17), scanned, rx ca-vit d B12 deficiency  RLS MSK: --DJD frozen shoulder Dizziness: chronic, carotid US 2016 neg, saw cards-- not likely CV related; saw neuro DR Everette Rank 02-2015  Thyroid nodules: Incidental   by Carotid US, BX 11-2014: Atypical findings, [facility] III, f/u  Endocrine RLL Lung nodule: Stable per CT 09-2016, no need for further B/B incontinence:  uses diapers     PLAN: Tinnitus: Chronic issue, patient thinks (is not sure) that Dr. Buddy Duty referred her to ENT.  That would be okay although I do not see any red flags. For now rec observation, she could use music background during the daytime if tinnitus is bothersome. RTC already scheduled for a routine visit next week

## 2022-01-28 NOTE — Patient Instructions (Signed)
Vaccines I recommend:  Covid booster RSV vaccine   Per our records you are due for your diabetic eye exam. Please contact your eye doctor to schedule an appointment. Please have them send copies of your office visit notes to [place]. Our fax number is [phone]. If you need a referral to an eye doctor please let us know.

## 2022-02-04 ENCOUNTER — Ambulatory Visit: Payer: Medicare Other | Admitting: Internal Medicine

## 2022-02-11 ENCOUNTER — Encounter: Payer: Self-pay | Admitting: Gastroenterology

## 2022-02-11 ENCOUNTER — Ambulatory Visit (AMBULATORY_SURGERY_CENTER): Payer: Medicare Other | Admitting: Gastroenterology

## 2022-02-11 VITALS — BP 121/64 | HR 51 | Temp 96.8°F | Resp 11 | Ht 65.0 in | Wt 185.0 lb

## 2022-02-11 DIAGNOSIS — D123 Benign neoplasm of transverse colon: Secondary | ICD-10-CM

## 2022-02-11 DIAGNOSIS — D125 Benign neoplasm of sigmoid colon: Secondary | ICD-10-CM

## 2022-02-11 DIAGNOSIS — D124 Benign neoplasm of descending colon: Secondary | ICD-10-CM

## 2022-02-11 DIAGNOSIS — Z86010 Personal history of colonic polyps: Secondary | ICD-10-CM | POA: Diagnosis not present

## 2022-02-11 DIAGNOSIS — Z800 Family history of malignant neoplasm of digestive organs: Secondary | ICD-10-CM

## 2022-02-11 DIAGNOSIS — Z09 Encounter for follow-up examination after completed treatment for conditions other than malignant neoplasm: Secondary | ICD-10-CM | POA: Diagnosis not present

## 2022-02-11 MED ORDER — SODIUM CHLORIDE 0.9 % IV SOLN: 500.0000 mL | Freq: Once | INTRAVENOUS | Status: DC

## 2022-02-11 NOTE — Op Note (Signed)
[facility] Patient Name: [name] Procedure Date: 02/11/2022 1:49 PM MRN: [id number] Endoscopist: Ladene Artist , MD, [id number] Age: 77 Referring MD:  Date of Birth: 05/22/1945 Gender: Female Account #: [id number] Procedure:                Colonoscopy Indications:              Surveillance: History of numerous adenomas on last                            colonoscopy (< 3 yrs), Family history of colon                            cancer, 1st-degree relative Medicines:                Monitored Anesthesia Care Procedure:                Pre-Anesthesia Assessment:                           - Prior to the procedure, a History and Physical                            was performed, and patient medications and                            allergies were reviewed. The patient's tolerance of                            previous anesthesia was also reviewed. The risks                            and benefits of the procedure and the sedation                            options and risks were discussed with the patient.                            All questions were answered, and informed consent                            was obtained. Prior Anticoagulants: The patient has                            taken no anticoagulant or antiplatelet agents. ASA                            Grade Assessment: III - A patient with severe                            systemic disease. After reviewing the risks and                            benefits, the patient was deemed in satisfactory  condition to undergo the procedure.                           After obtaining informed consent, the colonoscope                            was passed under direct vision. Throughout the                            procedure, the patient's blood pressure, pulse, and                            oxygen saturations were monitored continuously. The                            Olympus SN [id number] was  introduced through the anus                            and advanced to the the cecum, identified by                            appendiceal orifice and ileocecal valve. The                            ileocecal valve, appendiceal orifice, and rectum                            were photographed. The quality of the bowel                            preparation was good. The colonoscopy was performed                            without difficulty. The patient tolerated the                            procedure well. Scope In: 2:08:13 PM Scope Out: 2:25:01 PM Scope Withdrawal Time: 0 hours 14 minutes 13 seconds  Total Procedure Duration: 0 hours 16 minutes 48 seconds  Findings:                 The perianal and digital rectal examinations were                            normal.                           Six sessile polyps were found in the sigmoid colon                            (1), descending colon (3) and transverse colon (2).                            The polyps were 5 to 8 mm in size. These polyps  were removed with a cold snare. Resection and                            retrieval were complete.                           Internal hemorrhoids were found during                            retroflexion. The hemorrhoids were moderate and                            Grade I (internal hemorrhoids that do not prolapse).                           The exam was otherwise without abnormality on                            direct and retroflexion views. Complications:            No immediate complications. Estimated blood loss:                            None. Estimated Blood Loss:     Estimated blood loss: none. Impression:               - Six 5 to 8 mm polyps in the sigmoid colon, in the                            descending colon and in the transverse colon,                            removed with a cold snare. Resected and retrieved.                           - Internal  hemorrhoids.                           - The examination was otherwise normal on direct                            and retroflexion views. Recommendation:           - No repeat colonoscopy due to age vs consider                            repeat colonoscopy in 3 years after studies are                            complete for surveillance based on pathology                            results.                           - Patient has a contact number available for  emergencies. The signs and symptoms of potential                            delayed complications were discussed with the                            patient. Return to normal activities tomorrow.                            Written discharge instructions were provided to the                            patient.                           - Resume previous diet.                           - Continue present medications.                           - Await pathology results. Ladene Artist, MD 02/11/2022 2:31:11 PM This report has been signed electronically.

## 2022-02-11 NOTE — Patient Instructions (Signed)
YOU HAD AN ENDOSCOPIC PROCEDURE TODAY: Refer to the procedure report and other information in the discharge instructions given to you for any specific questions about what was found during the examination. If this information does not answer your questions, please call [facility] office at [phone] to clarify.   YOU SHOULD EXPECT: Some feelings of bloating in the abdomen. Passage of more gas than usual. Walking can help get rid of the air that was put into your GI tract during the procedure and reduce the bloating. If you had a lower endoscopy (such as a colonoscopy or flexible sigmoidoscopy) you may notice spotting of blood in your stool or on the toilet paper. Some abdominal soreness may be present for a day or two, also.  DIET: Your first meal following the procedure should be a light meal and then it is ok to progress to your normal diet. A half-sandwich or bowl of soup is an example of a good first meal. Heavy or fried foods are harder to digest and may make you feel nauseous or bloated. Drink plenty of fluids but you should avoid alcoholic beverages for 24 hours. If you had a esophageal dilation, please see attached instructions for diet.    ACTIVITY: Your care partner should take you home directly after the procedure. You should plan to take it easy, moving slowly for the rest of the day. You can resume normal activity the day after the procedure however YOU SHOULD NOT DRIVE, use power tools, machinery or perform tasks that involve climbing or major physical exertion for 24 hours (because of the sedation medicines used during the test).   SYMPTOMS TO REPORT IMMEDIATELY: A gastroenterologist can be reached at any hour. Please call [phone]  for any of the following symptoms:  Following lower endoscopy (colonoscopy, flexible sigmoidoscopy) Excessive amounts of blood in the stool  Significant tenderness, worsening of abdominal pains  Swelling of the abdomen that is new, acute  Fever of 100 or  higher  Following upper endoscopy (EGD, EUS, ERCP, esophageal dilation) Vomiting of blood or coffee ground material  New, significant abdominal pain  New, significant chest pain or pain under the shoulder blades  Painful or persistently difficult swallowing  New shortness of breath  Black, tarry-looking or red, bloody stools  FOLLOW UP:  If any biopsies were taken you will be contacted by phone or by letter within the next 1-3 weeks. Call [phone]  if you have not heard about the biopsies in 3 weeks.  Please also call with any specific questions about appointments or follow up tests.YOU HAD AN ENDOSCOPIC PROCEDURE TODAY: Refer to the procedure report and other information in the discharge instructions given to you for any specific questions about what was found during the examination. If this information does not answer your questions, please call [facility] office at [phone] to clarify.   YOU SHOULD EXPECT: Some feelings of bloating in the abdomen. Passage of more gas than usual. Walking can help get rid of the air that was put into your GI tract during the procedure and reduce the bloating. If you had a lower endoscopy (such as a colonoscopy or flexible sigmoidoscopy) you may notice spotting of blood in your stool or on the toilet paper. Some abdominal soreness may be present for a day or two, also.  DIET: Your first meal following the procedure should be a light meal and then it is ok to progress to your normal diet. A half-sandwich or bowl of soup is an example of a   good first meal. Heavy or fried foods are harder to digest and may make you feel nauseous or bloated. Drink plenty of fluids but you should avoid alcoholic beverages for 24 hours. If you had a esophageal dilation, please see attached instructions for diet.    ACTIVITY: Your care partner should take you home directly after the procedure. You should plan to take it easy, moving slowly for the rest of the day. You can resume  normal activity the day after the procedure however YOU SHOULD NOT DRIVE, use power tools, machinery or perform tasks that involve climbing or major physical exertion for 24 hours (because of the sedation medicines used during the test).   SYMPTOMS TO REPORT IMMEDIATELY: A gastroenterologist can be reached at any hour. Please call [phone]  for any of the following symptoms:  Following lower endoscopy (colonoscopy, flexible sigmoidoscopy) Excessive amounts of blood in the stool  Significant tenderness, worsening of abdominal pains  Swelling of the abdomen that is new, acute  Fever of 100 or higher  FOLLOW UP:  If any biopsies were taken you will be contacted by phone or by letter within the next 1-3 weeks. Call [phone]  if you have not heard about the biopsies in 3 weeks.  Please also call with any specific questions about appointments or follow up tests. 

## 2022-02-11 NOTE — Progress Notes (Signed)
To pacu, VSS. Report to [email or web address] 

## 2022-02-11 NOTE — Progress Notes (Signed)
History & Physical  Primary Care Physician:  Colon Branch, MD Primary Gastroenterologist: Lucio Edward, MD  Impression / Plan:  Personal history of multiple adenomatous colon polyps and family history of colon cancer, first-degree relative, for colonoscopy.  CHIEF COMPLAINT: [facility], Personal history of colon polyps   HPI: [name] is a 77 y.o. female with a personal history of multiple adenomatous colon polyps and family history of colon cancer, first-degree relative, for colonoscopy.   Past Medical History:  Diagnosis Date   Adenomatous colon polyp 03/1988   Anemia    borderline   Anxiety    Arthritis    R shoulder - degenerative    Chest pain     Sept 2011: stress test neg   Depression    sees Dr.Cotle   Diabetes mellitus    dr Buddy Duty   Diverticulosis    Fatty liver    Increased LFTs, saw GI 06/2011, likely from fatty liver    GERD (gastroesophageal reflux disease)    History of hiatal hernia    Hyperlipemia    Hypertension    IBS (irritable bowel syndrome)    and dyspepsia   Memory impairment 08/2014   mild cognitive impairment vs. mild dementia, reommended reinstating of cholinesterase inhibitor    Osteopenia    dexa 06/2007 and 10/11 Rx CA vitamin d   Osteopenia    RLS (restless legs syndrome)    h/o- no longer using Requip   Sleep apnea    Mouth guard use at home   Thyroid nodule     Past Surgical History:  Procedure Laterality Date   BREAST BIOPSY Left 10/11/2015   fibroadenoma w/ calcifications, no evidence of malignancy, recommend mammogram repeat-6 mnths   COLONOSCOPY  04/15/2014   POLYPECTOMY     REVERSE SHOULDER ARTHROPLASTY Right 04/21/2014   Procedure: REVERSE SHOULDER ARTHROPLASTY;  Surgeon: Tania Ade, MD;  Location: [place];  Service: Orthopedics;  Laterality: Right;  Right reverse total shoulder replacement   TONSILLECTOMY     UTERINE FIBROID EMBOLIZATION     90s    Prior to Admission medications   Medication Sig Start Date End  Date Taking? Authorizing Provider  ACCU-CHEK FASTCLIX LANCETS MISC daily. use as directed 04/26/17   [provider]  acetaminophen (TYLENOL) 325 MG tablet Take 650 mg by mouth every 6 (six) hours as needed for mild pain or headache.    [provider]  atenolol (TENORMIN) 50 MG tablet Take 25 mg by mouth daily. 07/15/18   [provider]  atorvastatin (LIPITOR) 40 MG tablet TAKE 1 TABLET BY MOUTH EVERY DAY 09/12/21   Colon Branch, MD  azelastine (ASTELIN) 0.1 % nasal spray Place 2 sprays into both nostrils at bedtime as needed for rhinitis. Use in each nostril as directed Patient not taking: Reported on 01/14/2022 07/21/17   Colon Branch, MD  BD VEO INSULIN SYRINGE U/F 31G X 15/64" 1 ML MISC USE TO INJECT insulin EVERY DAY 05/14/17   [provider]  donepezil (ARICEPT) 10 MG tablet Take 2 tablets (20 mg total) by mouth at bedtime. 01/18/21   Colon Branch, MD  gabapentin (NEURONTIN) 100 MG capsule Take 1 capsule (100 mg total) by mouth at bedtime. 10/11/21   Colon Branch, MD  LEVEMIR 100 UNIT/ML injection Inject into the skin. 07/16/19   [provider]  losartan (COZAAR) 25 MG tablet TAKE 1 TABLET BY MOUTH EVERY EVENING 09/12/21   Colon Branch, MD  memantine (  NAMENDA) 10 MG tablet Take 1 tablet (10 mg total) by mouth 2 (two) times daily. 01/18/21   Colon Branch, MD  metFORMIN (GLUCOPHAGE-XR) 500 MG 24 hr tablet TAKE 1 TABLET BY MOUTH EVERY MORNING WITH A MEAL 07/15/18   [provider]  mirabegron ER (MYRBETRIQ) 50 MG TB24 tablet Take 1 tablet by mouth daily. 05/29/20   [provider]  Multiple Vitamins-Minerals (CENTRUM SILVER ULTRA WOMENS) TABS TAKE 1 TABLET BY MOUTH EVERY DAY 10/27/20   Colon Branch, MD  ONE TOUCH ULTRA TEST test strip USE TO CHECK BLOOD SUGAR EVERY DAY 10/20/17   [provider]  pantoprazole (PROTONIX) 40 MG tablet TAKE 1 TABLET BY MOUTH 2 TIMES DAILY BEFORE A MEAL 12/01/21   Colon Branch, MD  sertraline (ZOLOFT) 100 MG tablet  Take 1 tablet (100 mg total) by mouth daily. 12/28/21   Colon Branch, MD  sodium fluoride (FLUORISHIELD) 1.1 % GEL dental gel Place 1 application onto teeth at bedtime as needed (for teeth).     [provider]  vitamin B-12 (CYANOCOBALAMIN) 1000 MCG tablet TAKE 1 TABLET BY MOUTH EVERY DAY 05/23/21   Colon Branch, MD  VITRON-C 65-125 MG TABS TAKE 1 TABLET BY MOUTH EVERY DAY 09/12/21   Colon Branch, MD    Current Outpatient Medications  Medication Sig Dispense Refill   ACCU-CHEK FASTCLIX LANCETS MISC daily. use as directed  4   acetaminophen (TYLENOL) 325 MG tablet Take 650 mg by mouth every 6 (six) hours as needed for mild pain or headache.     atenolol (TENORMIN) 50 MG tablet Take 25 mg by mouth daily.     atorvastatin (LIPITOR) 40 MG tablet TAKE 1 TABLET BY MOUTH EVERY DAY 90 tablet 1   azelastine (ASTELIN) 0.1 % nasal spray Place 2 sprays into both nostrils at bedtime as needed for rhinitis. Use in each nostril as directed (Patient not taking: Reported on 01/14/2022) 30 mL 3   BD VEO INSULIN SYRINGE U/F 31G X 15/64" 1 ML MISC USE TO INJECT insulin EVERY DAY  11   donepezil (ARICEPT) 10 MG tablet Take 2 tablets (20 mg total) by mouth at bedtime. 180 tablet 1   gabapentin (NEURONTIN) 100 MG capsule Take 1 capsule (100 mg total) by mouth at bedtime. 90 capsule 1   LEVEMIR 100 UNIT/ML injection Inject into the skin.     losartan (COZAAR) 25 MG tablet TAKE 1 TABLET BY MOUTH EVERY EVENING 90 tablet 1   memantine (NAMENDA) 10 MG tablet Take 1 tablet (10 mg total) by mouth 2 (two) times daily. 180 tablet 1   metFORMIN (GLUCOPHAGE-XR) 500 MG 24 hr tablet TAKE 1 TABLET BY MOUTH EVERY MORNING WITH A MEAL     mirabegron ER (MYRBETRIQ) 50 MG TB24 tablet Take 1 tablet by mouth daily.     Multiple Vitamins-Minerals (CENTRUM SILVER ULTRA WOMENS) TABS TAKE 1 TABLET BY MOUTH EVERY DAY 90 tablet 3   ONE TOUCH ULTRA TEST test strip USE TO CHECK BLOOD SUGAR EVERY DAY  4   pantoprazole (PROTONIX) 40 MG tablet  TAKE 1 TABLET BY MOUTH 2 TIMES DAILY BEFORE A MEAL 180 tablet 1   sertraline (ZOLOFT) 100 MG tablet Take 1 tablet (100 mg total) by mouth daily. 90 tablet 1   sodium fluoride (FLUORISHIELD) 1.1 % GEL dental gel Place 1 application onto teeth at bedtime as needed (for teeth).      vitamin B-12 (CYANOCOBALAMIN) 1000 MCG tablet TAKE 1 TABLET  BY MOUTH EVERY DAY 90 tablet 3   VITRON-C 65-125 MG TABS TAKE 1 TABLET BY MOUTH EVERY DAY 90 tablet 1   Current Facility-Administered Medications  Medication Dose Route Frequency Provider Last Rate Last Admin   0.9 %  sodium chloride infusion  500 mL Intravenous Once Ladene Artist, MD        Allergies as of 02/11/2022 - Review Complete 02/11/2022  Allergen Reaction Noted   Sulfa antibiotics Nausea Only 22/97/9892   Trulicity [dulaglutide] Nausea Only 04/11/2014   Penicillins Rash and Other (See Comments) 02/06/2006    Family History  Problem Relation Age of Onset   Lung cancer Mother        smoker   Colon cancer Father        F dx in his 65s   Colon polyps Sister    Colon polyps Sister    Breast cancer Maternal Aunt    Ovarian cancer Paternal Aunt        ?   Heart attack Maternal Grandmother        60s   Diabetes Other        aunts-uncles    Stroke Other        aunts-uncles    Other Other        niece- glioblastoma   Rectal cancer Neg Hx    Stomach cancer Neg Hx    Esophageal cancer Neg Hx    Crohn's disease Neg Hx    Ulcerative colitis Neg Hx     Social History   Socioeconomic History   Marital status: Widowed    Spouse name: [name]   Number of children: 3   Years of education: Not on file   Highest education level: Not on file  Occupational History   Occupation: retired, was a Geophysicist/field seismologist health)    Comment: RN  Tobacco Use   Smoking status: Former    Packs/day: 2.00    Years: 10.00    Total pack years: 20.00    Types: Cigarettes    Quit date: 11/26/1975    Years since quitting: 46.2   Smokeless tobacco: Never    Tobacco comments:    used to smoke 2 ppd  Vaping Use   Vaping Use: Never used  Substance and Sexual Activity   Alcohol use: Yes    Alcohol/week: 0.0 standard drinks of alcohol    Comment: rarely wine   Drug use: No   Sexual activity: Not Currently  Other Topics Concern   Not on file  Social History Narrative   Widow , lives by herself at [facility] independent living, 1 bedroom apt;  still drives (limited) .   Daughters Merchant navy officer) live  in [place] in [place]     5 g-children   Social Determinants of Health   Financial Resource Strain: Low Risk  (04/12/2021)   Overall Financial Resource Strain (CARDIA)    Difficulty of Paying Living Expenses: Not hard at all  Food Insecurity: No Food Insecurity (04/12/2021)   Hunger Vital Sign    Worried About Running Out of Food in the Last Year: Never true    Ran Out of Food in the Last Year: Never true  Transportation Needs: No Transportation Needs (04/12/2021)   PRAPARE - Hydrologist (Medical): No    Lack of Transportation (Non-Medical): No  Physical Activity: Inactive (04/12/2021)   Exercise Vital Sign    Days of Exercise per Week: 0 days  Minutes of Exercise per Session: 0 min  Stress: No Stress Concern Present (04/12/2021)   [place]    Feeling of Stress : Not at all  Social Connections: Not on file  Intimate Partner Violence: Not on file    Review of Systems:  All systems reviewed were negative except where noted in HPI.   Physical Exam: General:  Alert, well-developed, in NAD Head:  Normocephalic and atraumatic. Eyes:  Sclera clear, no icterus.   Conjunctiva pink. Ears:  Normal auditory acuity. Mouth:  No deformity or lesions.  Neck:  Supple; no masses. Lungs:  Clear throughout to auscultation.   No wheezes, crackles, or rhonchi.  Heart:  Regular rate and rhythm; no murmurs. Abdomen:  Soft, nondistended, nontender. No  masses, hepatomegaly. No palpable masses.  Normal bowel sounds.    Rectal:  Deferred   Msk:  Symmetrical without gross deformities. Extremities:  Without edema. Neurologic:  Alert and  oriented x 4; grossly normal neurologically. Skin:  Intact without significant lesions or rashes. Psych:  Alert and cooperative. Normal mood and affect.   Pricilla Riffle. Fuller Plan  02/11/2022, 1:49 PM See Shea Evans, [facility], to contact our on call provider

## 2022-02-11 NOTE — Progress Notes (Signed)
VS by CW  Pt's states no medical or surgical changes since previsit or office visit.  

## 2022-02-11 NOTE — Progress Notes (Signed)
Called to room to assist during endoscopic procedure.  Patient ID and intended procedure confirmed with present staff. Received instructions for my participation in the procedure from the performing physician.  

## 2022-02-12 ENCOUNTER — Other Ambulatory Visit: Payer: Self-pay | Admitting: Internal Medicine

## 2022-02-12 ENCOUNTER — Telehealth: Payer: Self-pay

## 2022-02-12 NOTE — Telephone Encounter (Signed)
Refill request for tramadol, no longer on med list. Please advise?

## 2022-02-12 NOTE — Telephone Encounter (Signed)
Initial Comment Caller states she has pain in her left foot. Translation No Disp. Time [name] Time) Disposition Final User 02/12/2022 4:10:30 AM Attempt made - message left [name] 02/12/2022 4:25:53 AM Attempt made - no message left [name] 02/12/2022 4:43:07 AM FINAL ATTEMPT MADE - message left [name] 02/12/2022 4:43:36 AM Send to [name] Final Attempt Lindsay Norman, [name], [name] 02/12/2022 5:02:37 AM Attempt made - message left [name] 02/12/2022 5:19:28 AM FINAL ATTEMPT MADE - message left Lindsay Damaris Schooner [name], [name] Final Disposition 02/12/2022 5:19:28 AM FINAL ATTEMPT MADE - message left Lindsay Damaris Schooner, [name], [name]

## 2022-02-13 ENCOUNTER — Telehealth: Payer: Self-pay | Admitting: *Deleted

## 2022-02-13 ENCOUNTER — Other Ambulatory Visit: Payer: Self-pay | Admitting: Internal Medicine

## 2022-02-13 NOTE — Telephone Encounter (Signed)
Attempted to call patient for their post-procedure follow-up call. No answer. Left voicemail.   

## 2022-02-13 NOTE — Telephone Encounter (Signed)
Although it has been on her medication list it has not been prescribed since 2022.  Decline prescription.  Office visit if she needs painkillers.

## 2022-02-18 ENCOUNTER — Telehealth: Payer: Self-pay | Admitting: Internal Medicine

## 2022-02-18 DIAGNOSIS — H9319 Tinnitus, unspecified ear: Secondary | ICD-10-CM

## 2022-02-18 NOTE — Telephone Encounter (Signed)
ENT or audiology?

## 2022-02-18 NOTE — Telephone Encounter (Signed)
ENT please

## 2022-02-18 NOTE — Telephone Encounter (Signed)
[name] (daughter [facility] [place]) called stating that pt would like Dr. Larose Kells to place a referral regarding her ringing in her ears as she believes Dr. Buddy Duty has not sent one regarding this issue.

## 2022-02-19 NOTE — Telephone Encounter (Signed)
Referral placed.

## 2022-02-21 ENCOUNTER — Encounter: Payer: Self-pay | Admitting: Gastroenterology

## 2022-03-07 ENCOUNTER — Encounter: Payer: Self-pay | Admitting: Internal Medicine

## 2022-03-13 ENCOUNTER — Other Ambulatory Visit: Payer: Self-pay | Admitting: Internal Medicine

## 2022-03-20 ENCOUNTER — Other Ambulatory Visit: Payer: Self-pay | Admitting: Internal Medicine

## 2022-03-21 ENCOUNTER — Ambulatory Visit: Payer: Medicare Other

## 2022-04-10 ENCOUNTER — Ambulatory Visit (INDEPENDENT_AMBULATORY_CARE_PROVIDER_SITE_OTHER): Payer: Medicare Other | Admitting: Podiatry

## 2022-04-10 DIAGNOSIS — Z91199 Patient's noncompliance with other medical treatment and regimen due to unspecified reason: Secondary | ICD-10-CM

## 2022-04-10 NOTE — Progress Notes (Signed)
1. No-show for appointment     

## 2022-04-16 ENCOUNTER — Ambulatory Visit (INDEPENDENT_AMBULATORY_CARE_PROVIDER_SITE_OTHER): Payer: Medicare Other | Admitting: *Deleted

## 2022-04-16 VITALS — Ht 65.0 in | Wt 183.2 lb

## 2022-04-16 DIAGNOSIS — Z0000 Encounter for general adult medical examination without abnormal findings: Secondary | ICD-10-CM | POA: Diagnosis not present

## 2022-04-16 NOTE — Progress Notes (Signed)
Subjective:   [name] is a 77 y.o. female who presents for Medicare Annual (Subsequent) preventive examination.  I connected with  [name] on 04/16/22 by a audio enabled telemedicine application and verified that I am speaking with the correct person using two identifiers.  Patient Location: Home  Provider Location: Office/Clinic  I discussed the limitations of evaluation and management by telemedicine. The patient expressed understanding and agreed to proceed.   Review of Systems     Cardiac Risk Factors include: advanced age (>77men, >16 women);diabetes mellitus;dyslipidemia;hypertension;obesity (BMI >30kg/m2)     Objective:    Today's Vitals   04/16/22 1101  Weight: 183 lb 3.2 oz (83.1 kg)  Height: 5\' 5"  (1.651 m)   Body mass index is 30.49 kg/m.     04/16/2022   10:54 AM 04/12/2021   12:47 PM 03/30/2019   11:22 AM 11/05/2018    2:46 PM 03/09/2018   11:10 AM 06/23/2017    2:05 AM 01/13/2017    9:31 AM  Advanced Directives  Does Patient Have a Medical Advance Directive? Yes Yes Yes Yes Yes No Yes  Type of Estate agent of [place];Living will Healthcare Power of [place];Living will Healthcare Power of [place];Living will Healthcare Power of [place];Living will Healthcare Power of [place];Living will  Healthcare Power of Attorney  Does patient want to make changes to medical advance directive? No - Patient declined   No - Patient declined   No - Patient declined  Copy of Healthcare Power of Attorney in Chart? Yes - validated most recent copy scanned in chart (See row information) Yes - validated most recent copy scanned in chart (See row information)  Yes - validated most recent copy scanned in chart (See row information) No - copy requested  Yes  Would patient like information on creating a medical advance directive?      No - Patient declined     Current Medications (verified) Outpatient Encounter Medications as of 04/16/2022   Medication Sig   ACCU-CHEK FASTCLIX LANCETS MISC daily. use as directed   acetaminophen (TYLENOL) 325 MG tablet Take 650 mg by mouth every 6 (six) hours as needed for mild pain or headache.   aspirin EC 81 MG tablet Take 1 tablet by mouth daily.   atenolol (TENORMIN) 50 MG tablet Take 25 mg by mouth daily.   atorvastatin (LIPITOR) 40 MG tablet TAKE 1 TABLET BY MOUTH EVERY DAY   BD VEO INSULIN SYRINGE U/F 31G X 15/64" 1 ML MISC USE TO INJECT insulin EVERY DAY   donepezil (ARICEPT) 10 MG tablet TAKE 2 TABLETS BY MOUTH EVERY DAY   gabapentin (NEURONTIN) 100 MG capsule TAKE 1 CAPSULE BY MOUTH AT BEDTIME   Insulin Pen Needle (B-D UF III MINI PEN NEEDLES) 31G X 5 MM MISC as directed SubQ use to inject insulin once a day   LEVEMIR 100 UNIT/ML injection Inject into the skin.   losartan (COZAAR) 25 MG tablet TAKE 1 TABLET BY MOUTH EVERY EVENING   memantine (NAMENDA) 10 MG tablet TAKE 1 TABLET BY MOUTH 2 TIMES DAILY   metFORMIN (GLUCOPHAGE-XR) 500 MG 24 hr tablet TAKE 1 TABLET BY MOUTH EVERY MORNING WITH A MEAL   mirabegron ER (MYRBETRIQ) 50 MG TB24 tablet Take 1 tablet by mouth daily.   Multiple Vitamins-Minerals (CENTRUM SILVER ULTRA WOMENS) TABS TAKE 1 TABLET BY MOUTH EVERY DAY   neomycin-polymyxin b-dexamethasone (MAXITROL) 3.5-10000-0.1 SUSP SMARTSIG:In Eye(s)   ONE TOUCH ULTRA TEST test strip USE TO CHECK BLOOD  SUGAR EVERY DAY   pantoprazole (PROTONIX) 40 MG tablet TAKE 1 TABLET BY MOUTH 2 TIMES DAILY BEFORE A MEAL   sertraline (ZOLOFT) 100 MG tablet Take 1 tablet (100 mg total) by mouth daily.   sodium fluoride (FLUORISHIELD) 1.1 % GEL dental gel Place 1 application onto teeth at bedtime as needed (for teeth).    vitamin B-12 (CYANOCOBALAMIN) 1000 MCG tablet TAKE 1 TABLET BY MOUTH EVERY DAY   VITRON-C 65-125 MG TABS TAKE 1 TABLET BY MOUTH EVERY DAY   [DISCONTINUED] azelastine (ASTELIN) 0.1 % nasal spray Place 2 sprays into both nostrils at bedtime as needed for rhinitis. Use in each nostril as  directed (Patient not taking: Reported on 01/14/2022)   No facility-administered encounter medications on file as of 04/16/2022.    Allergies (verified) Sulfa antibiotics, Trulicity [dulaglutide], and Penicillins   History: Past Medical History:  Diagnosis Date   Adenomatous colon polyp 03/1988   Anemia    borderline   Anxiety    Arthritis    R shoulder - degenerative    Chest pain     Sept 2011: stress test neg   Depression    sees Dr.Cotle   Diabetes mellitus    dr Sharl Ma   Diverticulosis    Fatty liver    Increased LFTs, saw GI 06/2011, likely from fatty liver    GERD (gastroesophageal reflux disease)    History of hiatal hernia    Hyperlipemia    Hypertension    IBS (irritable bowel syndrome)    and dyspepsia   Memory impairment 08/2014   mild cognitive impairment vs. mild dementia, reommended reinstating of cholinesterase inhibitor    Osteopenia    dexa 06/2007 and 10/11 Rx CA vitamin d   Osteopenia    RLS (restless legs syndrome)    h/o- no longer using Requip   Sleep apnea    Mouth guard use at home   Thyroid nodule    Past Surgical History:  Procedure Laterality Date   BREAST BIOPSY Left 10/11/2015   fibroadenoma w/ calcifications, no evidence of malignancy, recommend mammogram repeat-6 mnths   COLONOSCOPY  04/15/2014   JOINT REPLACEMENT     POLYPECTOMY     REVERSE SHOULDER ARTHROPLASTY Right 04/21/2014   Procedure: REVERSE SHOULDER ARTHROPLASTY;  Surgeon: Jones Broom, MD;  Location: [facility] OR;  Service: Orthopedics;  Laterality: Right;  Right reverse total shoulder replacement   TONSILLECTOMY     UTERINE FIBROID EMBOLIZATION     90s   Family History  Problem Relation Age of Onset   Lung cancer Mother        smoker   Colon cancer Father        F dx in his 45s   Colon polyps Sister    Colon polyps Sister    Breast cancer Maternal Aunt    Ovarian cancer Paternal Aunt        ?   Heart attack Maternal Grandmother        60s   Diabetes Other         aunts-uncles    Stroke Other        aunts-uncles    Other Other        niece- glioblastoma   Rectal cancer Neg Hx    Stomach cancer Neg Hx    Esophageal cancer Neg Hx    Crohn's disease Neg Hx    Ulcerative colitis Neg Hx    Social History   Socioeconomic History   Marital status: Widowed  Spouse name: Merton Border   Number of children: 3   Years of education: Not on file   Highest education level: Not on file  Occupational History   Occupation: retired, was a Education officer, 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  Tobacco Use   Smoking status: Former    Packs/day: 2.00    Years: 10.00    Additional pack years: 0.00    Total pack years: 20.00    Types: Cigarettes    Quit date: 11/26/1975    Years since quitting: 46.4   Smokeless tobacco: Never   Tobacco comments:    used to smoke 2 ppd  Vaping Use   Vaping Use: Never used  Substance and Sexual Activity   Alcohol use: Yes    Alcohol/week: 0.0 standard drinks of alcohol    Comment: rarely wine   Drug use: No   Sexual activity: Not Currently  Other Topics Concern   Not on file  Social History Narrative   Widow , [facility] by herself at [facility] living, 1 bedroom apt;  still drives (limited) .   Daughters Engineer, site) live  in [place] in [place]     5 g-children   Social Determinants of Health   Financial Resource Strain: Low Risk  (04/12/2021)   Overall Financial Resource Strain (CARDIA)    Difficulty of Paying Living Expenses: Not hard at all  Food Insecurity: No Food Insecurity (04/16/2022)   Hunger Vital Sign    Worried About Running Out of Food in the Last Year: Never true    Ran Out of Food in the Last Year: Never true  Transportation Needs: No Transportation Needs (04/16/2022)   PRAPARE - Administrator, Civil Service (Medical): No    Lack of Transportation (Non-Medical): No  Physical Activity: Inactive (04/12/2021)   Exercise Vital Sign    Days of Exercise per Week: 0 days    Minutes of Exercise  per Session: 0 min  Stress: No Stress Concern Present (04/12/2021)   [employer] of Occupational Health - Occupational Stress Questionnaire    Feeling of Stress : Not at all  Social Connections: Moderately Integrated (04/16/2022)   Social Connection and Isolation Panel [NHANES]    Frequency of Communication with Friends and Family: More than three times a week    Frequency of Social Gatherings with Friends and Family: More than three times a week    Attends Religious Services: More than 4 times per year    Active Member of [employer] or Organizations: Yes    Attends Banker Meetings: More than 4 times per year    Marital Status: Widowed    Tobacco Counseling Counseling given: Not Answered Tobacco comments: used to smoke 2 ppd   Clinical Intake:  Pre-visit preparation completed: Yes  Pain : No/denies pain  BMI - recorded: 30.49 Nutritional Status: BMI > 30  Obese Diabetes: Yes CBG done?: No Did pt. bring in CBG monitor from home?: No  How often do you need to have someone help you when you read instructions, pamphlets, or other written materials from your doctor or pharmacy?: 1 - Never  Activities of Daily Living    04/16/2022   11:09 AM  In your present state of health, do you have any difficulty performing the following activities:  Hearing? 0  Vision? 0  Difficulty concentrating or making decisions? 1  Comment trouble remembering  Walking or climbing stairs? 0  Dressing or bathing? 0  Doing errands,  shopping? 1  Comment Independant living center provides Wellsite geologist and eating ? N  Using the Toilet? N  In the past six months, have you accidently leaked urine? Y  Do you have problems with loss of bowel control? N  Managing your Medications? N  Managing your Finances? N  Housekeeping or managing your Housekeeping? N    Patient Care Team: Wanda Plump, MD as PCP - General Al Corpus, Max T, DPM as Consulting Physician (Podiatry) Curt Bears, MD as Referring Physician (Neurology) Talmage Coin, MD as Consulting Physician (Endocrinology) Fredrich Birks, OD as Referring Physician (Optometry) Archer Asa, MD as Consulting Physician (Psychiatry) Noland Fordyce, MD as Consulting Physician (Obstetrics and Gynecology) Jannifer Hick, MD as Consulting Physician (Urology)  Indicate any recent Medical Services you may have received from other than [facility] providers in the past year (date may be approximate).     Assessment:   This is a routine wellness examination for [name].  Hearing/Vision screen No results found.  Dietary issues and exercise activities discussed: Current Exercise Habits: The patient does not participate in regular exercise at present, Exercise limited by: None identified   Goals Addressed   None    Depression Screen    04/16/2022   11:08 AM 11/07/2021    2:55 PM 07/18/2021    3:16 PM 04/12/2021   12:49 PM 01/17/2021    2:21 PM 07/12/2020    9:31 AM 03/23/2020    9:35 AM  PHQ 2/9 Scores  PHQ - 2 Score 0 0 2 0 1 1 2   PHQ- 9 Score  3 7  4 4 4     Fall Risk    04/16/2022   11:03 AM 01/28/2022    8:57 AM 01/28/2022    8:56 AM 11/07/2021    2:25 PM 07/18/2021    2:28 PM  Fall Risk   Falls in the past year? 1 1 1 1 1   Number falls in past yr: 0 0 1 0 1  Injury with Fall? 0 1 0 0 1  Risk for fall due to : No Fall Risks History of fall(s)     Follow up Falls evaluation completed Falls evaluation completed Falls evaluation completed Falls evaluation completed Falls evaluation completed    FALL RISK PREVENTION PERTAINING TO THE HOME:  Any stairs in or around the home? Yes  If so, are there any without handrails? No  Home free of loose throw rugs in walkways, pet beds, electrical cords, etc? Yes  Adequate lighting in your home to reduce risk of falls? Yes   ASSISTIVE DEVICES UTILIZED TO PREVENT FALLS:  Life alert? No  Use of a cane, walker or w/c? No  Grab bars in the bathroom? Yes  Shower chair or bench  in shower? Yes  Elevated toilet seat or a handicapped toilet? No   TIMED UP AND GO:  Was the test performed?  No, audio visit .    Cognitive Function:    01/13/2017    9:39 AM 01/12/2016   10:02 AM  MMSE - Mini Mental State Exam  Orientation to time 5 5  Orientation to Place 5 5  Registration 3 3  Attention/ Calculation 5 5  Recall 2 3  Language- name 2 objects 2 2  Language- repeat 1 1  Language- follow 3 step command 3 3  Language- read & follow direction 1 1  Write a sentence 1 1  Copy design 1 1  Total score 29 30  04/16/2022   11:14 AM  6CIT Screen  What Year? 0 points  What month? 0 points  What time? 0 points  Count back from 20 2 points  Months in reverse 2 points  Repeat phrase 10 points  Total Score 14 points    Immunizations Immunization History  Administered Date(s) Administered   Fluad Quad(high Dose 65+) 09/30/2018   Influenza Split 11/26/2010, 11/28/2011, 10/26/2013   Influenza, High Dose Seasonal PF 09/21/2014, 09/18/2015, 10/09/2016, 09/27/2017   Influenza,inj,Quad PF,6+ Mos 10/27/2012   Influenza-Unspecified 01/06/2020, 09/02/2020, 10/07/2021   Moderna Sars-Covid-2 Vaccination 01/11/2019, 02/08/2019, 06/06/2020   PFIZER(Purple Top)SARS-COV-2 Vaccination 01/07/2020   PNEUMOCOCCAL CONJUGATE-20 07/18/2021   Pneumococcal Conjugate-13 12/24/2013   Pneumococcal Polysaccharide-23 06/09/2007, 11/30/2012   Tdap 01/11/2022   Tetanus 11/30/2012   Zoster Recombinat (Shingrix) 11/07/2017, 01/27/2018   Zoster, Live 03/24/2008    TDAP status: Up to date  Flu Vaccine status: Up to date  Pneumococcal vaccine status: Up to date  Covid-19 vaccine status: Information provided on how to obtain vaccines.   Qualifies for Shingles Vaccine? Yes   Zostavax completed Yes   Shingrix Completed?: Yes  Screening Tests Health Maintenance  Topic Date Due   Diabetic kidney evaluation - Urine ACR  09/09/2014   OPHTHALMOLOGY EXAM  02/03/2020   COVID-19  Vaccine (5 - 2023-24 season) 09/07/2021   Medicare Annual Wellness (AWV)  04/13/2022   HEMOGLOBIN A1C  07/25/2022   INFLUENZA VACCINE  08/08/2022   Diabetic kidney evaluation - eGFR measurement  01/25/2023   FOOT EXAM  01/25/2023   DTaP/Tdap/Td (2 - Td or Tdap) 01/12/2032   Pneumonia Vaccine 4265+ Years old  Completed   DEXA SCAN  Completed   Hepatitis C Screening  Completed   Zoster Vaccines- Shingrix  Completed   HPV VACCINES  Aged Out   MAMMOGRAM  Discontinued   COLONOSCOPY (Pts [id number] Insurance coverage will need to be confirmed)  Discontinued    Health Maintenance  Health Maintenance Due  Topic Date Due   Diabetic kidney evaluation - Urine ACR  09/09/2014   OPHTHALMOLOGY EXAM  02/03/2020   COVID-19 Vaccine (5 - 2023-24 season) 09/07/2021   Medicare Annual Wellness (AWV)  04/13/2022    Colorectal cancer screening: No longer required.   Mammogram status: Completed 11/07/21. Repeat every year  Bone Density status: Ordered 07/18/21. Pt provided with contact info and advised to call to schedule appt.  Lung Cancer Screening: (Low Dose CT Chest recommended if Age 29-80 years, 30 pack-year currently smoking OR have quit w/in 15years.) does not qualify.   Additional Screening:  Hepatitis C Screening: does qualify; Completed 05/29/11  Vision Screening: Recommended annual ophthalmology exams for early detection of glaucoma and other disorders of the eye. Is the patient up to date with their annual eye exam?  Yes  Who is the provider or what is the name of the office in which the patient attends annual eye exams? [facility] If pt is not established with a provider, would they like to be referred to a provider to establish care? No .   Dental Screening: Recommended annual dental exams for proper oral hygiene  Community Resource Referral / Chronic Care Management: CRR required this visit?  No   CCM required this visit?  No      Plan:     I have personally  reviewed and noted the following in the patient's chart:   Medical and social history Use of alcohol, tobacco or illicit drugs  Current medications and supplements including  opioid prescriptions. Patient is not currently taking opioid prescriptions. Functional ability and status Nutritional status Physical activity Advanced directives List of other physicians Hospitalizations, surgeries, and ER visits in previous 12 months Vitals Screenings to include cognitive, depression, and falls Referrals and appointments  In addition, I have reviewed and discussed with patient certain preventive protocols, quality metrics, and best practice recommendations. A written personalized care plan for preventive services as well as general preventive health recommendations were provided to patient.   Due to this being a telephonic visit, the after visit summary with patients personalized plan was offered to patient via mail or my-chart. Patient would like to access on my-chart.   Donne Anon, [place]   04/16/2022   Nurse Notes: None

## 2022-04-16 NOTE — Patient Instructions (Signed)
[name] , Thank you for taking time to come for your Medicare Wellness Visit. I appreciate your ongoing commitment to your health goals. Please review the following plan we discussed and let me know if I can assist you in the future.     This is a list of the screening recommended for you and due dates:  Health Maintenance  Topic Date Due   Yearly kidney health urinalysis for diabetes  09/09/2014   Eye exam for diabetics  02/03/2020   COVID-19 Vaccine (5 - 2023-24 season) 09/07/2021   Hemoglobin A1C  07/25/2022   Flu Shot  08/08/2022   Yearly kidney function blood test for diabetes  01/25/2023   Complete foot exam   01/25/2023   Medicare Annual Wellness Visit  04/16/2023   DTaP/Tdap/Td vaccine (2 - Td or Tdap) 01/12/2032   Pneumonia Vaccine  Completed   DEXA scan (bone density measurement)  Completed   Hepatitis C Screening: USPSTF Recommendation to screen - Ages 41-79 yo.  Completed   Zoster (Shingles) Vaccine  Completed   HPV Vaccine  Aged Out   Mammogram  Discontinued   Colon Cancer Screening  Discontinued     Next appointment: Follow up in one year for your annual wellness visit.   Preventive Care 37 Years and Older, Female Preventive care refers to lifestyle choices and visits with your health care provider that can promote health and wellness. What does preventive care include? A yearly physical exam. This is also called an annual well check. Dental exams once or twice a year. Routine eye exams. Ask your health care provider how often you should have your eyes checked. Personal lifestyle choices, including: Daily care of your teeth and gums. Regular physical activity. Eating a healthy diet. Avoiding tobacco and drug use. Limiting alcohol use. Practicing safe sex. Taking low-dose aspirin every day. Taking vitamin and mineral supplements as recommended by your health care provider. What happens during an annual well check? The services and screenings done by your  health care provider during your annual well check will depend on your age, overall health, lifestyle risk factors, and family history of disease. Counseling  Your health care provider may ask you questions about your: Alcohol use. Tobacco use. Drug use. Emotional well-being. Home and relationship well-being. Sexual activity. Eating habits. History of falls. Memory and ability to understand (cognition). Work and work Astronomer. Reproductive health. Screening  You may have the following tests or measurements: Height, weight, and BMI. Blood pressure. Lipid and cholesterol levels. These may be checked every 5 years, or more frequently if you are over 55 years old. Skin check. Lung cancer screening. You may have this screening every year starting at age 18 if you have a 30-pack-year history of smoking and currently smoke or have quit within the past 15 years. Fecal occult blood test (FOBT) of the stool. You may have this test every year starting at age 27. Flexible sigmoidoscopy or colonoscopy. You may have a sigmoidoscopy every 5 years or a colonoscopy every 10 years starting at age 48. Hepatitis C blood test. Hepatitis B blood test. Sexually transmitted disease (STD) testing. Diabetes screening. This is done by checking your blood sugar (glucose) after you have not eaten for a while (fasting). You may have this done every 1-3 years. Bone density scan. This is done to screen for osteoporosis. You may have this done starting at age 81. Mammogram. This may be done every 1-2 years. Talk to your health care provider about how often  you should have regular mammograms. Talk with your health care provider about your test results, treatment options, and if necessary, the need for more tests. Vaccines  Your health care provider may recommend certain vaccines, such as: Influenza vaccine. This is recommended every year. Tetanus, diphtheria, and acellular pertussis (Tdap, Td) vaccine. You may  need a Td booster every 10 years. Zoster vaccine. You may need this after age 29. Pneumococcal 13-valent conjugate (PCV13) vaccine. One dose is recommended after age 61. Pneumococcal polysaccharide (PPSV23) vaccine. One dose is recommended after age 99. Talk to your health care provider about which screenings and vaccines you need and how often you need them. This information is not intended to replace advice given to you by your health care provider. Make sure you discuss any questions you have with your health care provider. Document Released: 01/20/2015 Document Revised: 09/13/2015 Document Reviewed: 10/25/2014 Elsevier Interactive Patient Education  2017 [employer] Prevention in the Home Falls can cause injuries. They can happen to people of all ages. There are many things you can do to make your home safe and to help prevent falls. What can I do on the outside of my home? Regularly fix the edges of walkways and driveways and fix any cracks. Remove anything that might make you trip as you walk through a door, such as a raised step or threshold. Trim any bushes or trees on the path to your home. Use bright outdoor lighting. Clear any walking paths of anything that might make someone trip, such as rocks or tools. Regularly check to see if handrails are loose or broken. Make sure that both sides of any steps have handrails. Any raised decks and porches should have guardrails on the edges. Have any leaves, snow, or ice cleared regularly. Use sand or salt on walking paths during winter. Clean up any spills in your garage right away. This includes oil or grease spills. What can I do in the bathroom? Use night lights. Install grab bars by the toilet and in the tub and shower. Do not use towel bars as grab bars. Use non-skid mats or decals in the tub or shower. If you need to sit down in the shower, use a plastic, non-slip stool. Keep the floor dry. Clean up any water that spills on  the floor as soon as it happens. Remove soap buildup in the tub or shower regularly. Attach bath mats securely with double-sided non-slip rug tape. Do not have throw rugs and other things on the floor that can make you trip. What can I do in the bedroom? Use night lights. Make sure that you have a light by your bed that is easy to reach. Do not use any sheets or blankets that are too big for your bed. They should not hang down onto the floor. Have a firm chair that has side arms. You can use this for support while you get dressed. Do not have throw rugs and other things on the floor that can make you trip. What can I do in the kitchen? Clean up any spills right away. Avoid walking on wet floors. Keep items that you use a lot in easy-to-reach places. If you need to reach something above you, use a strong step stool that has a grab bar. Keep electrical cords out of the way. Do not use floor polish or wax that makes floors slippery. If you must use wax, use non-skid floor wax. Do not have throw rugs and other things on  the floor that can make you trip. What can I do with my stairs? Do not leave any items on the stairs. Make sure that there are handrails on both sides of the stairs and use them. Fix handrails that are broken or loose. Make sure that handrails are as long as the stairways. Check any carpeting to make sure that it is firmly attached to the stairs. Fix any carpet that is loose or worn. Avoid having throw rugs at the top or bottom of the stairs. If you do have throw rugs, attach them to the floor with carpet tape. Make sure that you have a light switch at the top of the stairs and the bottom of the stairs. If you do not have them, ask someone to add them for you. What else can I do to help prevent falls? Wear shoes that: Do not have high heels. Have rubber bottoms. Are comfortable and fit you well. Are closed at the toe. Do not wear sandals. If you use a stepladder: Make sure  that it is fully opened. Do not climb a closed stepladder. Make sure that both sides of the stepladder are locked into place. Ask someone to hold it for you, if possible. Clearly mark and make sure that you can see: Any grab bars or handrails. First and last steps. Where the edge of each step is. Use tools that help you move around (mobility aids) if they are needed. These include: Canes. Walkers. Scooters. Crutches. Turn on the lights when you go into a dark area. Replace any light bulbs as soon as they burn out. Set up your furniture so you have a clear path. Avoid moving your furniture around. If any of your floors are uneven, fix them. If there are any pets around you, be aware of where they are. Review your medicines with your doctor. Some medicines can make you feel dizzy. This can increase your chance of falling. Ask your doctor what other things that you can do to help prevent falls. This information is not intended to replace advice given to you by your health care provider. Make sure you discuss any questions you have with your health care provider. Document Released: 10/20/2008 Document Revised: 06/01/2015 Document Reviewed: 01/28/2014 Elsevier Interactive Patient Education  2017 [employer].

## 2022-04-22 ENCOUNTER — Encounter: Payer: Self-pay | Admitting: *Deleted

## 2022-04-25 ENCOUNTER — Ambulatory Visit (INDEPENDENT_AMBULATORY_CARE_PROVIDER_SITE_OTHER): Payer: Medicare Other | Admitting: Podiatry

## 2022-04-25 DIAGNOSIS — Z91199 Patient's noncompliance with other medical treatment and regimen due to unspecified reason: Secondary | ICD-10-CM

## 2022-04-25 NOTE — Progress Notes (Signed)
Pt was a no show for apt, charge generated 

## 2022-05-09 ENCOUNTER — Encounter: Payer: Self-pay | Admitting: Internal Medicine

## 2022-05-09 ENCOUNTER — Ambulatory Visit (INDEPENDENT_AMBULATORY_CARE_PROVIDER_SITE_OTHER): Payer: Medicare Other | Admitting: Podiatry

## 2022-05-09 DIAGNOSIS — M79675 Pain in left toe(s): Secondary | ICD-10-CM | POA: Diagnosis not present

## 2022-05-09 DIAGNOSIS — B351 Tinea unguium: Secondary | ICD-10-CM

## 2022-05-09 DIAGNOSIS — M79674 Pain in right toe(s): Secondary | ICD-10-CM

## 2022-05-09 DIAGNOSIS — L84 Corns and callosities: Secondary | ICD-10-CM

## 2022-05-09 DIAGNOSIS — E1142 Type 2 diabetes mellitus with diabetic polyneuropathy: Secondary | ICD-10-CM

## 2022-05-09 NOTE — Progress Notes (Signed)
  Subjective:  Patient ID: [name], female    DOB: [birth year],  MRN: [id number]  Chief Complaint  Patient presents with   Nail Problem    [facility]     77 y.o. female presents with the above complaint. History confirmed with patient. Patient presenting with pain related to dystrophic thickened elongated nails. Patient is unable to trim own nails related to nail dystrophy and/or mobility issues. Patient does have a history of T2DM. Patient does have callus present located at the great toes bilaterally causing pain.   Objective:  Physical Exam: warm, good capillary refill nail exam onychomycosis of the toenails, onycholysis, and dystrophic nails DP pulses palpable, PT pulses palpable, and protective sensation absent Left Foot:  Pain with palpation of nails due to elongation and dystrophic growth.  Right Foot: Pain with palpation of nails due to elongation and dystrophic growth.   Assessment:   1. Pain due to onychomycosis of toenails of both feet   2. Pre-ulcerative calluses   3. DM type 2 with diabetic peripheral neuropathy (HCC)      Plan:  Patient was evaluated and treated and all questions answered.  #DM2 with neuropathy Patient educated on diabetes. Discussed proper diabetic foot care and discussed risks and complications of disease. Educated patient in depth on reasons to return to the office immediately should he/she discover anything concerning or new on the feet. All questions answered. Discussed proper shoes as well.    #Hyperkeratotic lesions/pre ulcerative calluses present hallux medial aspect of the IPJ bilateral All symptomatic hyperkeratoses x 2 separate lesions were safely debrided with a sterile #10 blade to patient's level of comfort without incident. We discussed preventative and palliative care of these lesions including supportive and accommodative shoegear, padding, prefabricated and custom molded accommodative orthoses, use of a pumice stone and lotions/creams  daily.  #Onychomycosis with pain  -Nails palliatively debrided as below. -Educated on self-care  Procedure: Nail Debridement Rationale: Pain Type of Debridement: manual, sharp debridement. Instrumentation: Nail nipper, rotary burr. Number of Nails: 10  Return in about 3 months (around 08/09/2022) for [facility].         Corinna Gab, DPM [facility]

## 2022-05-24 ENCOUNTER — Other Ambulatory Visit: Payer: Self-pay | Admitting: Internal Medicine

## 2022-06-18 ENCOUNTER — Telehealth: Payer: Self-pay

## 2022-06-18 ENCOUNTER — Telehealth: Payer: Self-pay | Admitting: Internal Medicine

## 2022-06-18 NOTE — Patient Outreach (Signed)
  Care Coordination   06/18/2022 Name: TANNYA GONET MRN: [id number] DOB: October 16, 1945   Care Coordination Outreach Attempts:  An unsuccessful telephone outreach was attempted today to offer the patient information about available care coordination services.  Follow Up Plan:  Additional outreach attempts will be made to offer the patient care coordination information and services.   Encounter Outcome:  No Answer   Care Coordination Interventions:  No, not indicated    Kathyrn Sheriff, RN, MSN, BSN, CCM [facility] Care Coordinator [phone]

## 2022-06-18 NOTE — Telephone Encounter (Signed)
LMOM asking for Pt's daughter Cammy Copa to return call, wanted to check to make sure this is not a spam call, and informed that she is overdue for a visit w/ Dr. Drue Novel. Asked that she call me back at her earliest convenience.

## 2022-06-18 NOTE — Telephone Encounter (Signed)
Rich with [facility] called to advise that they will be patient's new primary pharmacy and she is subscribed to a med reminder program so they need all of the patient's regularly prescribed medications to be sent to them. If there are any questions you can call Rich back directly at [phone].   Pharmacy information:  [address] [place] [address] [phone]

## 2022-06-19 ENCOUNTER — Other Ambulatory Visit: Payer: Self-pay | Admitting: Internal Medicine

## 2022-06-19 NOTE — Telephone Encounter (Signed)
Appt scheduled for 06/26/22.

## 2022-06-24 NOTE — Telephone Encounter (Signed)
Received call again today for Rich at [facility]- informed that I left a voicemail for Pt's daughter Cammy Copa to confirm change to this pharmacy. Informed Rich that I never received a call back. He was wanting to three way [name] while on the phone for verification- informed that I was still in clinic with Dr. Drue Novel seeing patients. Rich verbalized understanding, he will try to reach back out to [place] and have her call our office

## 2022-06-26 ENCOUNTER — Other Ambulatory Visit: Payer: Self-pay

## 2022-06-26 ENCOUNTER — Ambulatory Visit: Payer: Medicare Other | Admitting: Internal Medicine

## 2022-06-26 MED ORDER — MEMANTINE HCL 10 MG PO TABS: 10.0000 mg | ORAL_TABLET | Freq: Two times a day (BID) | ORAL | 1 refills | Status: DC

## 2022-06-26 MED ORDER — PANTOPRAZOLE SODIUM 40 MG PO TBEC: 40.0000 mg | DELAYED_RELEASE_TABLET | Freq: Two times a day (BID) | ORAL | 1 refills | Status: DC

## 2022-06-26 MED ORDER — ATENOLOL 50 MG PO TABS: 25.0000 mg | ORAL_TABLET | Freq: Every day | ORAL | 1 refills | Status: DC

## 2022-06-26 MED ORDER — GABAPENTIN 100 MG PO CAPS: 100.0000 mg | ORAL_CAPSULE | Freq: Every day | ORAL | 1 refills | Status: DC

## 2022-06-26 MED ORDER — SERTRALINE HCL 100 MG PO TABS: 100.0000 mg | ORAL_TABLET | Freq: Every day | ORAL | 1 refills | Status: DC

## 2022-06-26 MED ORDER — CENTRUM SILVER ULTRA WOMENS PO TABS: 1.0000 | ORAL_TABLET | Freq: Every day | ORAL | 3 refills | Status: AC

## 2022-06-26 MED ORDER — VITAMIN B-12 1000 MCG PO TABS: 1000.0000 ug | ORAL_TABLET | Freq: Every day | ORAL | 3 refills | Status: AC

## 2022-06-26 MED ORDER — ATORVASTATIN CALCIUM 40 MG PO TABS: 40.0000 mg | ORAL_TABLET | Freq: Every day | ORAL | 1 refills | Status: DC

## 2022-06-26 MED ORDER — LOSARTAN POTASSIUM 25 MG PO TABS: 25.0000 mg | ORAL_TABLET | Freq: Every evening | ORAL | 1 refills | Status: DC

## 2022-06-26 MED ORDER — VITRON-C 65-125 MG PO TABS: 1.0000 | ORAL_TABLET | Freq: Every day | ORAL | 3 refills | Status: DC

## 2022-06-26 MED ORDER — DONEPEZIL HCL 10 MG PO TABS: 20.0000 mg | ORAL_TABLET | Freq: Every day | ORAL | 1 refills | Status: DC

## 2022-06-26 NOTE — Addendum Note (Signed)
Addended byConrad [place] D on: 06/26/2022 04:10 PM   Modules accepted: Orders

## 2022-06-26 NOTE — Telephone Encounter (Signed)
Spoke w/ Cammy Copa- she confirmed that they do want Pt's meds to go through [facility] now.

## 2022-06-28 ENCOUNTER — Telehealth: Payer: Self-pay | Admitting: Internal Medicine

## 2022-06-28 NOTE — Telephone Encounter (Signed)
Spoke w/ Cammy Copa- she informed that she was able to discuss concerns with Pt's facility. She thanked me for call back.

## 2022-06-28 NOTE — Telephone Encounter (Signed)
Patients daughter Cammy Copa called and had some PT concerns that she would like to address with nurse. Please call

## 2022-07-03 ENCOUNTER — Ambulatory Visit (INDEPENDENT_AMBULATORY_CARE_PROVIDER_SITE_OTHER): Payer: Medicare Other | Admitting: Internal Medicine

## 2022-07-03 ENCOUNTER — Encounter: Payer: Self-pay | Admitting: Internal Medicine

## 2022-07-03 VITALS — BP 126/60 | HR 56 | Temp 97.8°F | Resp 18 | Ht 65.0 in | Wt 185.5 lb

## 2022-07-03 DIAGNOSIS — I10 Essential (primary) hypertension: Secondary | ICD-10-CM

## 2022-07-03 DIAGNOSIS — Z794 Long term (current) use of insulin: Secondary | ICD-10-CM

## 2022-07-03 DIAGNOSIS — F341 Dysthymic disorder: Secondary | ICD-10-CM

## 2022-07-03 DIAGNOSIS — F03B0 Unspecified dementia, moderate, without behavioral disturbance, psychotic disturbance, mood disturbance, and anxiety: Secondary | ICD-10-CM

## 2022-07-03 DIAGNOSIS — E785 Hyperlipidemia, unspecified: Secondary | ICD-10-CM

## 2022-07-03 DIAGNOSIS — E118 Type 2 diabetes mellitus with unspecified complications: Secondary | ICD-10-CM | POA: Diagnosis not present

## 2022-07-03 DIAGNOSIS — Z7984 Long term (current) use of oral hypoglycemic drugs: Secondary | ICD-10-CM

## 2022-07-03 NOTE — Progress Notes (Unsigned)
Subjective:    Patient ID: [name], female    DOB: [birth year], 77 y.o.   MRN: [id number]  DOS:  07/03/2022 Type of visit - description: Routine checkup, here w/ Hansel Starling   Today we talk about her chronic medical problems. She told  me "I miss my mind". She realizes her memory is not the same and that makes her very sad.  Her companion  tells me that she seems stable.  No behavioral issues.  No aggressive behavior.  No wandering.  Review of Systems See above   Past Medical History:  Diagnosis Date   Adenomatous colon polyp 03/1988   Anemia    borderline   Anxiety    Arthritis    R shoulder - degenerative    Chest pain     Sept 2011: stress test neg   Depression    sees Dr.Cotle   Diabetes mellitus    dr Sharl Ma   Diverticulosis    Fatty liver    Increased LFTs, saw GI 06/2011, likely from fatty liver    GERD (gastroesophageal reflux disease)    History of hiatal hernia    Hyperlipemia    Hypertension    IBS (irritable bowel syndrome)    and dyspepsia   Memory impairment 08/2014   mild cognitive impairment vs. mild dementia, reommended reinstating of cholinesterase inhibitor    Osteopenia    dexa 06/2007 and 10/11 Rx CA vitamin d   Osteopenia    RLS (restless legs syndrome)    h/o- no longer using Requip   Sleep apnea    Mouth guard use at home   Thyroid nodule     Past Surgical History:  Procedure Laterality Date   BREAST BIOPSY Left 10/11/2015   fibroadenoma w/ calcifications, no evidence of malignancy, recommend mammogram repeat-6 mnths   COLONOSCOPY  04/15/2014   JOINT REPLACEMENT     POLYPECTOMY     REVERSE SHOULDER ARTHROPLASTY Right 04/21/2014   Procedure: REVERSE SHOULDER ARTHROPLASTY;  Surgeon: Jones Broom, MD;  Location: [facility] OR;  Service: Orthopedics;  Laterality: Right;  Right reverse total shoulder replacement   TONSILLECTOMY     UTERINE FIBROID EMBOLIZATION     90s    Current Outpatient Medications  Medication Instructions    ACCU-CHEK FASTCLIX LANCETS MISC Daily, use as directed   acetaminophen (TYLENOL) 650 mg, Oral, Every 6 hours PRN   aspirin EC 81 MG tablet 1 tablet, Oral, Daily   atenolol (TENORMIN) 25 mg, Oral, Daily   atorvastatin (LIPITOR) 40 mg, Oral, Daily   BD VEO INSULIN SYRINGE U/F 31G X 15/64" 1 ML MISC USE TO INJECT insulin EVERY DAY   cyanocobalamin (VITAMIN B12) 1,000 mcg, Oral, Daily   donepezil (ARICEPT) 20 mg, Oral, Daily   gabapentin (NEURONTIN) 100 mg, Oral, Daily at bedtime   Insulin Pen Needle (B-D UF III MINI PEN NEEDLES) 31G X 5 MM MISC as directed SubQ use to inject insulin once a day   Iron-Vitamin C (VITRON-C) 65-125 MG TABS 1 tablet, Oral, Daily   LEVEMIR 100 UNIT/ML injection Subcutaneous   losartan (COZAAR) 25 mg, Oral, Every evening   memantine (NAMENDA) 10 mg, Oral, 2 times daily   metFORMIN (GLUCOPHAGE-XR) 500 MG 24 hr tablet TAKE 1 TABLET BY MOUTH EVERY MORNING WITH A MEAL   mirabegron ER (MYRBETRIQ) 50 MG TB24 tablet 1 tablet, Oral, Daily   Multiple Vitamins-Minerals (CENTRUM SILVER ULTRA WOMENS) TABS 1 tablet, Oral, Daily   neomycin-polymyxin b-dexamethasone (MAXITROL) 3.5-10000-0.1 SUSP SMARTSIG:In Eye(s)  ONE TOUCH ULTRA TEST test strip USE TO CHECK BLOOD SUGAR EVERY DAY   pantoprazole (PROTONIX) 40 mg, Oral, 2 times daily   sertraline (ZOLOFT) 100 mg, Oral, Daily   sodium fluoride (FLUORISHIELD) 1.1 % GEL dental gel 1 application , dental, At bedtime PRN       Objective:   Physical Exam BP 126/60   Pulse (!) 56   Temp 97.8 F (36.6 C) (Oral)   Resp 18   Ht 5\' 5"  (1.651 m)   Wt 185 lb 8 oz (84.1 kg)   SpO2 96%   BMI 30.87 kg/m  General:   Well developed, NAD, BMI noted. HEENT:  Normocephalic . Face symmetric, atraumatic Lungs:  CTA B Normal respiratory effort, no intercostal retractions, no accessory muscle use. Heart: RRR,  no murmur.  Lower extremities: no pretibial edema bilaterally  Skin: Not pale. Not jaundice Neurologic:  alert & oriented to  self, place.  Not oriented in time. Speech normal, gait appropriate for age and unassisted Psych--  Behavior appropriate. Slightly tearful when we talk about her memory    Assessment     ASSESSMENT DM Dr. Sharl Ma HTN Hyperlipidemia H/o SIADH Depression seen elsewhere Mild cognitive impairment  MMSE 2015 --> 28, on Aricept, namenda added 04-2016 by Dr Antonietta Barcelona  OSA -- mild, also told RLS (Rx requip); saw Dr Shelle Iron 2014, no CPAP GI: --IBS, colon polyps, diverticulosis, hiatal hernia --Chronic constipation likely part of her IBS syndrome  --Fatty liver >>> GI eval 2013 --iron fec anemia:  cscope 04-2014 : 2 polyps; + hemocult @ GI office 10-2014: EGD done (-), bx neg Osteopenia: T score -1.1  2009 , osteopenia again per dexa 05-2013, t score -1.8 (10-09-17), scanned, rx ca-vit d B12 deficiency  RLS MSK: --DJD frozen shoulder Dizziness: chronic, carotid US 2016 neg, saw cards-- not likely CV related; saw neuro DR Antonietta Barcelona 02-2015  Thyroid nodules: Incidental   by Carotid US, BX 11-2014: Atypical findings, [facility] III, f/u  Endocrine RLL Lung nodule: Stable per CT 09-2016, no need for further B/B incontinence:  uses diapers     PLAN: HTN: Currently on Tenormin, losartan, checking labs. High cholesterol: On atorvastatin, check FLP. DM: No recent notes from endocrinology available but apparently seen 02/28/2022 Dementia: On Aricept, Namenda.  Seem to be stable.  Reports she feels sad because she realizes her memory is not the same.  Adrienne who assist her prn is here today, reports no worsening symptoms, no behavioral issues, no wandering. Patient is counseled the best I could regards depression. Depression: See above, continue sertraline. MSK: Occasional left shoulder pain with arm motion, request PT. Preventive care: Vaccine advice provided Colonoscopy 02/11/2022, 6 polyps.  Next per GI. Social: Lives at [facility], independent living, meds are arranged by her daughter Inda Merlin has a  companion prn (Adrianne) RTC CPX in few months

## 2022-07-03 NOTE — Patient Instructions (Addendum)
Vaccines I recommend: Covid booster RSV vaccine Flu shot this fall  Check the  blood pressure regularly BP GOAL is between 110/65 and  135/85. If it is consistently higher or lower, let me know     GO TO THE LAB : Get the blood work     GO TO THE FRONT DESK, PLEASE SCHEDULE YOUR APPOINTMENTS Come back for a physical exam in 3 months

## 2022-07-04 LAB — CBC WITH DIFFERENTIAL/PLATELET
Basophils Absolute: 0 10*3/uL (ref 0.0–0.1)
Basophils Relative: 0.5 % (ref 0.0–3.0)
Eosinophils Absolute: 0.2 10*3/uL (ref 0.0–0.7)
Eosinophils Relative: 3.1 % (ref 0.0–5.0)
HCT: 38.4 % (ref 36.0–46.0)
Hemoglobin: 12.8 g/dL (ref 12.0–15.0)
Lymphocytes Relative: 26.4 % (ref 12.0–46.0)
Lymphs Abs: 2 10*3/uL (ref 0.7–4.0)
MCHC: 33.3 g/dL (ref 30.0–36.0)
MCV: 87.8 fl (ref 78.0–100.0)
Monocytes Absolute: 0.4 10*3/uL (ref 0.1–1.0)
Monocytes Relative: 5.2 % (ref 3.0–12.0)
Neutro Abs: 5 10*3/uL (ref 1.4–7.7)
Neutrophils Relative %: 64.8 % (ref 43.0–77.0)
Platelets: 157 10*3/uL (ref 150.0–400.0)
RBC: 4.38 Mil/uL (ref 3.87–5.11)
RDW: 13.8 % (ref 11.5–15.5)
WBC: 7.7 10*3/uL (ref 4.0–10.5)

## 2022-07-04 LAB — BASIC METABOLIC PANEL
BUN: 15 mg/dL (ref 6–23)
CO2: 27 mEq/L (ref 19–32)
Calcium: 9.8 mg/dL (ref 8.4–10.5)
Chloride: 102 mEq/L (ref 96–112)
Creatinine, Ser: 0.84 mg/dL (ref 0.40–1.20)
GFR: 67.05 mL/min (ref >60.00)
Glucose, Bld: 158 mg/dL — ABNORMAL HIGH (ref 70–99)
Potassium: 4.1 mEq/L (ref 3.5–5.1)
Sodium: 138 mEq/L (ref 135–145)

## 2022-07-04 LAB — LIPID PANEL
Cholesterol: 129 mg/dL (ref 0–200)
HDL: 44.6 mg/dL (ref >39.00)
NonHDL: 84.4
Total CHOL/HDL Ratio: 3
Triglycerides: 240 mg/dL — ABNORMAL HIGH (ref 0.0–149.0)
VLDL: 48 mg/dL — ABNORMAL HIGH (ref 0.0–40.0)

## 2022-07-04 LAB — MICROALBUMIN / CREATININE URINE RATIO
Creatinine,U: 130.7 mg/dL
Microalb Creat Ratio: 0.7 mg/g (ref 0.0–30.0)
Microalb, Ur: 1 mg/dL (ref 0.0–1.9)

## 2022-07-04 LAB — ALT: ALT: 20 U/L (ref 0–35)

## 2022-07-04 LAB — LDL CHOLESTEROL, DIRECT: Direct LDL: 58 mg/dL

## 2022-07-04 LAB — AST: AST: 21 U/L (ref 0–37)

## 2022-07-04 NOTE — Assessment & Plan Note (Signed)
HTN: Currently on Tenormin, losartan, checking labs. High cholesterol: On atorvastatin, check FLP. DM: No recent notes from endocrinology available but apparently seen 02/28/2022 Dementia: On Aricept, Namenda.  Seem to be stable.  Reports she feels sad because she realizes her memory is not the same.  Adrienne who assist her prn is here today, reports no worsening symptoms, no behavioral issues, no wandering. Patient is counseled the best I could regards depression. Depression: See above, continue sertraline. MSK: Occasional left shoulder pain with arm motion, request PT. Preventive care: Vaccine advice provided Colonoscopy 02/11/2022, 6 polyps.  Next per GI. Social: Lives at [facility], independent living, meds are arranged by her daughter Inda Merlin has a companion prn (Adrianne) RTC CPX in few months

## 2022-07-04 NOTE — Telephone Encounter (Signed)
Pt called to see if she dropped off a form for Dr. Drue Novel to complete for her to receive PT at the facility she currently lives at. Advised nothing noted in chart. Pt said she may have forgotten him to sign it. Requested she have the facility to fax it to [place]. Gave pt fax number

## 2022-07-22 ENCOUNTER — Telehealth: Payer: Self-pay

## 2022-07-22 NOTE — Telephone Encounter (Signed)
SLP orders signed and faxed back to [facility] at [phone] and sent for scanning.

## 2022-08-08 ENCOUNTER — Ambulatory Visit: Payer: Medicare Other | Admitting: Podiatry

## 2022-08-22 ENCOUNTER — Encounter (INDEPENDENT_AMBULATORY_CARE_PROVIDER_SITE_OTHER): Payer: Self-pay

## 2022-08-22 LAB — HM DIABETES FOOT EXAM

## 2022-08-22 LAB — TSH: TSH: 0.2 — AB (ref 0.41–5.90)

## 2022-08-22 LAB — HEMOGLOBIN A1C: Hemoglobin A1C: 8.4

## 2022-09-25 ENCOUNTER — Ambulatory Visit (INDEPENDENT_AMBULATORY_CARE_PROVIDER_SITE_OTHER): Payer: Medicare Other | Admitting: Internal Medicine

## 2022-09-25 ENCOUNTER — Encounter: Payer: Self-pay | Admitting: Internal Medicine

## 2022-09-25 VITALS — BP 122/66 | HR 57 | Temp 98.1°F | Resp 16 | Ht 65.0 in | Wt 178.4 lb

## 2022-09-25 DIAGNOSIS — E538 Deficiency of other specified B group vitamins: Secondary | ICD-10-CM | POA: Diagnosis not present

## 2022-09-25 DIAGNOSIS — E785 Hyperlipidemia, unspecified: Secondary | ICD-10-CM

## 2022-09-25 DIAGNOSIS — E118 Type 2 diabetes mellitus with unspecified complications: Secondary | ICD-10-CM

## 2022-09-25 DIAGNOSIS — F03B0 Unspecified dementia, moderate, without behavioral disturbance, psychotic disturbance, mood disturbance, and anxiety: Secondary | ICD-10-CM

## 2022-09-25 NOTE — Patient Instructions (Addendum)
Vaccines I recommend: Covid booster- new this fall RSV vaccine Proceed with a flu shot.    Check the  blood pressure regularly Blood pressure goal:  between 110/65 and  135/85. If it is consistently higher or lower, let me know      Next visit with me in 3 to 4 months, physical exam.     Please schedule it at the front desk

## 2022-09-25 NOTE — Progress Notes (Unsigned)
Subjective:    Patient ID: [name], female    DOB: 1945/01/19, 77 y.o.   MRN: [id number]  DOS:  09/25/2022 Type of visit - description: Follow-up, here with Adrianne, her caregiver.  The patient reports that he is doing well physically and emotionally.  She has no concerns. No recent falls.   Review of Systems See above   Past Medical History:  Diagnosis Date   Adenomatous colon polyp 03/1988   Anemia    borderline   Anxiety    Arthritis    R shoulder - degenerative    Chest pain     Sept 2011: stress test neg   Depression    sees Dr.Cotle   Diabetes mellitus    dr Sharl Ma   Diverticulosis    Fatty liver    Increased LFTs, saw GI 06/2011, likely from fatty liver    GERD (gastroesophageal reflux disease)    History of hiatal hernia    Hyperlipemia    Hypertension    IBS (irritable bowel syndrome)    and dyspepsia   Memory impairment 08/2014   mild cognitive impairment vs. mild dementia, reommended reinstating of cholinesterase inhibitor    Osteopenia    dexa 06/2007 and 10/11 Rx CA vitamin d   Osteopenia    RLS (restless legs syndrome)    h/o- no longer using Requip   Sleep apnea    Mouth guard use at home   Thyroid nodule     Past Surgical History:  Procedure Laterality Date   BREAST BIOPSY Left 10/11/2015   fibroadenoma w/ calcifications, no evidence of malignancy, recommend mammogram repeat-6 mnths   COLONOSCOPY  04/15/2014   JOINT REPLACEMENT     POLYPECTOMY     REVERSE SHOULDER ARTHROPLASTY Right 04/21/2014   Procedure: REVERSE SHOULDER ARTHROPLASTY;  Surgeon: Jones Broom, MD;  Location: [facility] OR;  Service: Orthopedics;  Laterality: Right;  Right reverse total shoulder replacement   TONSILLECTOMY     UTERINE FIBROID EMBOLIZATION     90s    Current Outpatient Medications  Medication Instructions   ACCU-CHEK FASTCLIX LANCETS MISC Daily, use as directed   acetaminophen (TYLENOL) 650 mg, Oral, Every 6 hours PRN   atenolol (TENORMIN) 25 mg, Oral,  Daily   atorvastatin (LIPITOR) 40 mg, Oral, Daily   BD VEO INSULIN SYRINGE U/F 31G X 15/64" 1 ML MISC USE TO INJECT insulin EVERY DAY   cyanocobalamin (VITAMIN B12) 1,000 mcg, Oral, Daily   donepezil (ARICEPT) 20 mg, Oral, Daily   gabapentin (NEURONTIN) 100 mg, Oral, Daily at bedtime   Insulin Pen Needle (B-D UF III MINI PEN NEEDLES) 31G X 5 MM MISC as directed SubQ use to inject insulin once a day   Iron-Vitamin C (VITRON-C) 65-125 MG TABS 1 tablet, Oral, Daily   LEVEMIR 100 UNIT/ML injection Subcutaneous   losartan (COZAAR) 25 mg, Oral, Every evening   memantine (NAMENDA) 10 mg, Oral, 2 times daily   metFORMIN (GLUCOPHAGE-XR) 500 MG 24 hr tablet TAKE 1 TABLET BY MOUTH EVERY MORNING WITH A MEAL   mirabegron ER (MYRBETRIQ) 50 MG TB24 tablet 1 tablet, Oral, Daily   Multiple Vitamins-Minerals (CENTRUM SILVER ULTRA WOMENS) TABS 1 tablet, Oral, Daily   neomycin-polymyxin b-dexamethasone (MAXITROL) 3.5-10000-0.1 SUSP SMARTSIG:In Eye(s)   ONE TOUCH ULTRA TEST test strip USE TO CHECK BLOOD SUGAR EVERY DAY   pantoprazole (PROTONIX) 40 mg, Oral, 2 times daily   sertraline (ZOLOFT) 100 mg, Oral, Daily   sodium fluoride (FLUORISHIELD) 1.1 % GEL dental gel  1 application , dental, At bedtime PRN       Objective:   Physical Exam BP 122/66   Pulse (!) 57   Temp 98.1 F (36.7 C) (Oral)   Resp 16   Ht 5\' 5"  (1.651 m)   Wt 178 lb 6 oz (80.9 kg)   SpO2 96%   BMI 29.68 kg/m  General:   Well developed, NAD, BMI noted. HEENT:  Normocephalic . Face symmetric, atraumatic Lungs:  CTA B Normal respiratory effort, no intercostal retractions, no accessory muscle use. Heart: RRR,  no murmur.  Lower extremities: no pretibial edema bilaterally  Skin: Not pale. Not jaundice Neurologic:  alert , pleasently demented, cooperative, follows all commands. Speech normal, gait unassisted, needs some help transferring. Psych--  Behavior appropriate. No anxious or depressed appearing.      Assessment     ASSESSMENT DM Dr. Sharl Ma HTN Hyperlipidemia H/o SIADH Depression seen elsewhere Dementia: Dr Antonietta Barcelona  OSA -- mild, also told RLS (Rx requip); saw Dr Shelle Iron 2014, no CPAP GI: --IBS, colon polyps, diverticulosis, hiatal hernia --Chronic constipation likely part of her IBS syndrome  --Fatty liver >>> GI eval 2013 --iron fec anemia:  cscope 04-2014 : 2 polyps; + hemocult @ GI office 10-2014: EGD done (-), bx neg Osteopenia: T score -1.1  2009 , osteopenia again per dexa 05-2013, t score -1.8 (10-09-17), scanned, rx ca-vit d B12 deficiency  RLS MSK: --DJD frozen shoulder Dizziness: chronic, carotid US 2016 neg, saw cards-- not likely CV related; saw neuro DR Antonietta Barcelona 02-2015  Thyroid nodules: Incidental   by Carotid US, BX 11-2014: Atypical findings, Bethesda III, f/u  Endocrine RLL Lung nodule: Stable per CT 09-2016, no need for further B/B incontinence:  uses diapers     PLAN: DM: Per Endo.  A1c 8.4 (August 22, 2022, [facility]). HTN: On atenolol, losartan, BP is very good, last CMP okay.  Check BPs from time to time. Dyslipidemia: On atorvastatin 40 mg daily, controlled. Dementia: Seems to be stable. Osteopenia: On vitamin D, no recent falls.  Last T-score 2019, DEXA ordered 07/2021, not pursued.  Will communicate with her daughter, pursue DEXA? B12 deficiency: Last level satisfactory Low TSH: TSH 01-2022 slightly suppressed, TSH was normal on 08/22/2022 ([facility]). Aspirin: No history of large stroke or CAD.  Okay to stop aspirin per guidelines. Vaccine advice provided. Will communicate with daughter regards above. RTC 3 to 4 months CPX

## 2022-09-26 ENCOUNTER — Other Ambulatory Visit: Payer: Self-pay | Admitting: Internal Medicine

## 2022-09-26 DIAGNOSIS — Z1231 Encounter for screening mammogram for malignant neoplasm of breast: Secondary | ICD-10-CM

## 2022-09-26 NOTE — Assessment & Plan Note (Signed)
DM: Per Endo.  A1c 8.4 (August 22, 2022, [facility]). HTN: On atenolol, losartan, BP is very good, last CMP okay.  Check BPs from time to time. Dyslipidemia: On atorvastatin 40 mg daily, controlled. Dementia: Seems to be stable. Osteopenia: On vitamin D, no recent falls.  Last T-score 2019, DEXA ordered 07/2021, not pursued.  Will communicate with her daughter, pursue DEXA? B12 deficiency: Last level satisfactory Low TSH: TSH 01-2022 slightly suppressed, TSH was normal on 08/22/2022 ([facility]). Aspirin: No history of large stroke or CAD.  Okay to stop aspirin per guidelines. Vaccine advice provided. Will communicate with daughter regards above. RTC 3 to 4 months CPX

## 2022-10-14 ENCOUNTER — Telehealth: Payer: Self-pay

## 2022-10-14 NOTE — Telephone Encounter (Signed)
Plan of care signed and faxed back to [facility] at [phone]. Form sent for scanning.

## 2022-11-11 ENCOUNTER — Ambulatory Visit
Admission: RE | Admit: 2022-11-11 | Discharge: 2022-11-11 | Disposition: A | Discharge status: Home / Self Care | Payer: Medicare Other | Source: Ambulatory Visit | Attending: Internal Medicine | Admitting: Internal Medicine

## 2022-11-11 DIAGNOSIS — Z1231 Encounter for screening mammogram for malignant neoplasm of breast: Secondary | ICD-10-CM

## 2022-11-27 ENCOUNTER — Encounter: Payer: Self-pay | Admitting: Internal Medicine

## 2022-12-30 ENCOUNTER — Telehealth: Payer: Self-pay

## 2022-12-30 NOTE — Telephone Encounter (Signed)
Speech therapy plan of care signed and faxed back to [facility] at [phone]. Form sent for scanning.

## 2023-01-20 LAB — HM DIABETES EYE EXAM

## 2023-01-21 ENCOUNTER — Other Ambulatory Visit: Payer: Self-pay | Admitting: Internal Medicine

## 2023-01-29 ENCOUNTER — Ambulatory Visit: Payer: Medicare Other | Admitting: Internal Medicine

## 2023-03-04 ENCOUNTER — Telehealth: Payer: Self-pay | Admitting: Internal Medicine

## 2023-03-04 NOTE — Telephone Encounter (Signed)
 Copied from CRM [id number]. Topic: Medicare AWV >> Mar 04, 2023  2:44 PM Payton Doughty wrote: Reason for CRM: Called LVM 03/04/2023 to schedule AWV. Please schedule office or virtual visits.  Verlee Rossetti; Care Guide Ambulatory Clinical Support [place] [facility] Direct Dial: [phone]

## 2023-03-19 ENCOUNTER — Encounter: Payer: Self-pay | Admitting: Internal Medicine

## 2023-03-19 ENCOUNTER — Ambulatory Visit: Payer: Medicare Other | Admitting: Internal Medicine

## 2023-03-19 VITALS — BP 124/66 | HR 56 | Temp 97.9°F | Resp 18 | Ht 65.0 in | Wt 167.4 lb

## 2023-03-19 DIAGNOSIS — F03B0 Unspecified dementia, moderate, without behavioral disturbance, psychotic disturbance, mood disturbance, and anxiety: Secondary | ICD-10-CM

## 2023-03-19 DIAGNOSIS — Z780 Asymptomatic menopausal state: Secondary | ICD-10-CM

## 2023-03-19 DIAGNOSIS — E118 Type 2 diabetes mellitus with unspecified complications: Secondary | ICD-10-CM

## 2023-03-19 DIAGNOSIS — M8580 Other specified disorders of bone density and structure, unspecified site: Secondary | ICD-10-CM

## 2023-03-19 DIAGNOSIS — E119 Type 2 diabetes mellitus without complications: Secondary | ICD-10-CM

## 2023-03-19 DIAGNOSIS — E785 Hyperlipidemia, unspecified: Secondary | ICD-10-CM

## 2023-03-19 DIAGNOSIS — Z0000 Encounter for general adult medical examination without abnormal findings: Secondary | ICD-10-CM | POA: Diagnosis not present

## 2023-03-19 DIAGNOSIS — I10 Essential (primary) hypertension: Secondary | ICD-10-CM | POA: Diagnosis not present

## 2023-03-19 DIAGNOSIS — Z0001 Encounter for general adult medical examination with abnormal findings: Secondary | ICD-10-CM

## 2023-03-19 MED ORDER — ATORVASTATIN CALCIUM 40 MG PO TABS: 40.0000 mg | ORAL_TABLET | Freq: Every day | ORAL | 1 refills | Status: DC

## 2023-03-19 MED ORDER — SERTRALINE HCL 100 MG PO TABS: 100.0000 mg | ORAL_TABLET | Freq: Every day | ORAL | 1 refills | Status: DC

## 2023-03-19 MED ORDER — LOSARTAN POTASSIUM 25 MG PO TABS: 25.0000 mg | ORAL_TABLET | Freq: Every evening | ORAL | 1 refills | Status: DC

## 2023-03-19 MED ORDER — PANTOPRAZOLE SODIUM 40 MG PO TBEC: 40.0000 mg | DELAYED_RELEASE_TABLET | Freq: Two times a day (BID) | ORAL | 1 refills | Status: DC

## 2023-03-19 MED ORDER — DONEPEZIL HCL 10 MG PO TABS
20.0000 mg | ORAL_TABLET | Freq: Every day | ORAL | 1 refills | Status: DC
Start: 2023-03-19 — End: 2023-04-10

## 2023-03-19 MED ORDER — ATENOLOL 50 MG PO TABS: 25.0000 mg | ORAL_TABLET | Freq: Every day | ORAL | 1 refills | Status: DC

## 2023-03-19 MED ORDER — GABAPENTIN 100 MG PO CAPS: 100.0000 mg | ORAL_CAPSULE | Freq: Every day | ORAL | 1 refills | Status: DC

## 2023-03-19 MED ORDER — MEMANTINE HCL 10 MG PO TABS: 10.0000 mg | ORAL_TABLET | Freq: Two times a day (BID) | ORAL | 1 refills | Status: AC

## 2023-03-19 NOTE — Progress Notes (Unsigned)
 Subjective:    Patient ID: [name], female    DOB: 03-27-1945, 78 y.o.   MRN: [id number]  DOS:  03/19/2023 Type of visit - description: CPX, here with her caregiver Adrianne   The patient has a history of dementia, reports she is doing okay and taking all her medications. No recent falls.   Review of Systems  ROS is limited due to dementia but she denies major problems.  Past Medical History:  Diagnosis Date   Adenomatous colon polyp 03/1988   Anemia    borderline   Anxiety    Arthritis    R shoulder - degenerative    Chest pain     Sept 2011: stress test neg   Depression    sees Dr.Cotle   Diabetes mellitus    dr Sharl Ma   Diverticulosis    Fatty liver    Increased LFTs, saw GI 06/2011, likely from fatty liver    GERD (gastroesophageal reflux disease)    History of hiatal hernia    Hyperlipemia    Hypertension    IBS (irritable bowel syndrome)    and dyspepsia   Memory impairment 08/2014   mild cognitive impairment vs. mild dementia, reommended reinstating of cholinesterase inhibitor    Osteopenia    dexa 06/2007 and 10/11 Rx CA vitamin d   Osteopenia    RLS (restless legs syndrome)    h/o- no longer using Requip   Sleep apnea    Mouth guard use at home   Thyroid nodule    Social History   Socioeconomic History   Marital status: Widowed    Spouse name: Merton Border   Number of children: 3   Years of education: Not on file   Highest education level: Bachelor's degree (e.g., BA, AB, BS)  Occupational History   Occupation: retired, was a Education officer, 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  Tobacco Use   Smoking status: Former    Current packs/day: 0.00    Average packs/day: 2.0 packs/day for 10.0 years (20.0 ttl pk-yrs)    Types: Cigarettes    Start date: 11/25/1965    Quit date: 11/26/1975    Years since quitting: 47.3   Smokeless tobacco: Never   Tobacco comments:    used to smoke 2 ppd  Vaping Use   Vaping status: Never Used  Substance and Sexual Activity    Alcohol use: Yes    Alcohol/week: 0.0 standard drinks of alcohol    Comment: rarely wine   Drug use: No   Sexual activity: Not Currently  Other Topics Concern   Not on file  Social History Narrative   Widow , lives by herself at [facility] independent living, 1 bedroom apt;  still drives (limited) .   Daughters Engineer, site) live  in [place] in [place]     5 g-children   Social Drivers of Health   Financial Resource Strain: Low Risk  (03/18/2023)   Overall Financial Resource Strain (CARDIA)    Difficulty of Paying Living Expenses: Not hard at all  Food Insecurity: No Food Insecurity (03/18/2023)   Hunger Vital Sign    Worried About Running Out of Food in the Last Year: Never true    Ran Out of Food in the Last Year: Never true  Transportation Needs: No Transportation Needs (03/18/2023)   PRAPARE - Administrator, Civil Service (Medical): No    Lack of Transportation (Non-Medical): No  Physical Activity: Insufficiently Active (03/18/2023)  Exercise Vital Sign    Days of Exercise per Week: 2 days    Minutes of Exercise per Session: 20 min  Stress: No Stress Concern Present (03/18/2023)   [employer] of Occupational Health - Occupational Stress Questionnaire    Feeling of Stress : Not at all  Social Connections: Moderately Integrated (03/18/2023)   Social Connection and Isolation Panel [NHANES]    Frequency of Communication with Friends and Family: More than three times a week    Frequency of Social Gatherings with Friends and Family: Once a week    Attends Religious Services: More than 4 times per year    Active Member of [employer] or Organizations: Yes    Attends Banker Meetings: More than 4 times per year    Marital Status: Widowed  Intimate Partner Violence: Not At Risk (04/16/2022)   Humiliation, Afraid, Rape, and Kick questionnaire    Fear of Current or Ex-Partner: No    Emotionally Abused: No    Physically Abused: No    Sexually Abused: No     Past Surgical History:  Procedure Laterality Date   BREAST BIOPSY Left 10/11/2015   fibroadenoma w/ calcifications, no evidence of malignancy, recommend mammogram repeat-6 mnths   COLONOSCOPY  04/15/2014   JOINT REPLACEMENT     POLYPECTOMY     REVERSE SHOULDER ARTHROPLASTY Right 04/21/2014   Procedure: REVERSE SHOULDER ARTHROPLASTY;  Surgeon: Jones Broom, MD;  Location: [facility] OR;  Service: Orthopedics;  Laterality: Right;  Right reverse total shoulder replacement   TONSILLECTOMY     UTERINE FIBROID EMBOLIZATION     90s   Social History   Socioeconomic History   Marital status: Widowed    Spouse name: Merton Border   Number of children: 3   Years of education: Not on file   Highest education level: Bachelor's degree (e.g., BA, AB, BS)  Occupational History   Occupation: retired, was a Education officer, 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  Tobacco Use   Smoking status: Former    Current packs/day: 0.00    Average packs/day: 2.0 packs/day for 10.0 years (20.0 ttl pk-yrs)    Types: Cigarettes    Start date: 11/25/1965    Quit date: 11/26/1975    Years since quitting: 47.3   Smokeless tobacco: Never   Tobacco comments:    used to smoke 2 ppd  Vaping Use   Vaping status: Never Used  Substance and Sexual Activity   Alcohol use: Yes    Alcohol/week: 0.0 standard drinks of alcohol    Comment: rarely wine   Drug use: No   Sexual activity: Not Currently  Other Topics Concern   Not on file  Social History Narrative   Widow , lives by herself at [facility] independent living, 1 bedroom apt;  still drives (limited) .   Daughters Engineer, site) live  in [place] in [place]     5 g-children   Social Drivers of Health   Financial Resource Strain: Low Risk  (03/18/2023)   Overall Financial Resource Strain (CARDIA)    Difficulty of Paying Living Expenses: Not hard at all  Food Insecurity: No Food Insecurity (03/18/2023)   Hunger Vital Sign    Worried About Running Out of Food in the Last  Year: Never true    Ran Out of Food in the Last Year: Never true  Transportation Needs: No Transportation Needs (03/18/2023)   PRAPARE - Administrator, Civil Service (Medical): No    Lack  of Transportation (Non-Medical): No  Physical Activity: Insufficiently Active (03/18/2023)   Exercise Vital Sign    Days of Exercise per Week: 2 days    Minutes of Exercise per Session: 20 min  Stress: No Stress Concern Present (03/18/2023)   [employer] of Occupational Health - Occupational Stress Questionnaire    Feeling of Stress : Not at all  Social Connections: Moderately Integrated (03/18/2023)   Social Connection and Isolation Panel [NHANES]    Frequency of Communication with Friends and Family: More than three times a week    Frequency of Social Gatherings with Friends and Family: Once a week    Attends Religious Services: More than 4 times per year    Active Member of [employer] or Organizations: Yes    Attends Banker Meetings: More than 4 times per year    Marital Status: Widowed  Intimate Partner Violence: Not At Risk (04/16/2022)   Humiliation, Afraid, Rape, and Kick questionnaire    Fear of Current or Ex-Partner: No    Emotionally Abused: No    Physically Abused: No    Sexually Abused: No    Current Outpatient Medications  Medication Instructions   ACCU-CHEK FASTCLIX LANCETS MISC Daily   acetaminophen (TYLENOL) 650 mg, Every 6 hours PRN   atenolol (TENORMIN) 25 mg, Oral, Daily   atorvastatin (LIPITOR) 40 mg, Oral, Daily   BD VEO INSULIN SYRINGE U/F 31G X 15/64" 1 ML MISC USE TO INJECT insulin EVERY DAY   cyanocobalamin (VITAMIN B12) 1,000 mcg, Oral, Daily   donepezil (ARICEPT) 20 mg, Oral, Daily   gabapentin (NEURONTIN) 100 mg, Oral, Daily at bedtime   Insulin Pen Needle (B-D UF III MINI PEN NEEDLES) 31G X 5 MM MISC as directed SubQ use to inject insulin once a day   LEVEMIR 100 UNIT/ML injection Inject into the skin.   losartan (COZAAR) 25 mg, Oral, Every  evening   memantine (NAMENDA) 10 mg, Oral, 2 times daily   metFORMIN (GLUCOPHAGE-XR) 500 MG 24 hr tablet TAKE 1 TABLET BY MOUTH EVERY MORNING WITH A MEAL   mirabegron ER (MYRBETRIQ) 50 MG TB24 tablet 1 tablet, Daily   Multiple Vitamins-Minerals (CENTRUM SILVER ULTRA WOMENS) TABS 1 tablet, Oral, Daily   ONE TOUCH ULTRA TEST test strip USE TO CHECK BLOOD SUGAR EVERY DAY   pantoprazole (PROTONIX) 40 mg, Oral, 2 times daily before meals   sertraline (ZOLOFT) 100 mg, Oral, Daily   sodium fluoride (FLUORISHIELD) 1.1 % GEL dental gel 1 application , At bedtime PRN       Objective:   Physical Exam BP 124/66   Pulse (!) 56   Temp 97.9 F (36.6 C) (Oral)   Resp 18   Ht 5\' 5"  (1.651 m)   Wt 167 lb 6 oz (75.9 kg)   SpO2 97%   BMI 27.85 kg/m  General:   Well developed, NAD, BMI noted. HEENT:  Normocephalic . Face symmetric, atraumatic Lungs:  CTA B Normal respiratory effort, no intercostal retractions, no accessory muscle use. Heart: RRR,  no murmur.  Lower extremities: no pretibial edema bilaterally  Skin: Not pale. Not jaundice Neurologic:  alert & pleasantly demented, cooperative. Speech normal, gait appropriate for age and unassisted Psych--  Behavior appropriate. No anxious or depressed appearing.      Assessment    ASSESSMENT DM Dr. Sharl Ma HTN Hyperlipidemia H/o SIADH Depression seen elsewhere Dementia: Dr Antonietta Barcelona  OSA -- mild, also told RLS (Rx requip); saw Dr Shelle Iron 2014, no CPAP GI: --IBS, colon polyps,  diverticulosis, hiatal hernia --Chronic constipation likely part of her IBS syndrome  --Fatty liver >>> GI eval 2013 --iron fec anemia:  cscope 04-2014 : 2 polyps; + hemocult @ GI office 10-2014: EGD done (-), bx neg Osteopenia: T score -1.1  2009 , osteopenia again per dexa 05-2013, t score -1.8 (10-09-17), scanned, rx ca-vit d B12 deficiency  RLS MSK: --DJD frozen shoulder Dizziness: chronic, carotid US 2016 neg, saw cards-- not likely CV related; saw neuro DR  Antonietta Barcelona 02-2015  Thyroid nodules: Incidental   by Carotid US, BX 11-2014: Atypical findings, Bethesda III, f/u  Endocrine RLL Lung nodule: Stable per CT 09-2016, no need for further B/B incontinence:  uses diapers     PLAN: Here for CPX  -Td:  2024 -pneumovax [id number];  prevnar: [id number].  PNM 20 - 2023 -Shingles shot 2010; S/p Shingrix x2 -Had a flu shot. - Recommend a COVID booster and RSV  -female care:  no h/o abnormal paps, aged out for screening but in the past daughter requested to see gynecology, will be happy to refer her if so desired  MMG   11/2022 ([facility]) -CCS: [facility] Colon cancer, Cscope : 04/18/2009, cscope again 04-2014: +polyps, colonoscopy 2021.  Last colonoscopy 2024, next per GI. -Labs: See orders -- POA on file - Message sent to the patient's daughter via MyChart. We also discussed the following: DM: Per Endo.  Next appointment tomorrow.   HTN: Check a BMP, CBC.  Continue atenolol, losartan, High cholesterol: On atorvastatin, controlled. Osteopenia: See AVS, bone density test if so desired. Dementia: Seems stable.  Continue Aricept, Namenda. Social: Currently at the independent living area of [facility], they recommended increase level of care, they are thinking about moving to [facility] assisted living. Adrianne, caregiver, help her with medication compliance.   I communicated via message with the patient's daughter to discuss plan of care. Multiple refills sent RTC 4 months CPX.

## 2023-03-19 NOTE — Patient Instructions (Addendum)
 We can arrange a bone density test and a visit to the gynecologist if you desire.  Check the  blood pressure regularly Blood pressure goal:  between 110/65 and  135/85. If it is consistently higher or lower, let me know     GO TO THE LAB : Get the blood work     Please go to the front desk: Range for a follow-up in 4 to 5 months

## 2023-03-20 ENCOUNTER — Encounter: Payer: Self-pay | Admitting: Internal Medicine

## 2023-03-20 LAB — CBC WITH DIFFERENTIAL/PLATELET
Basophils Absolute: 0.1 10*3/uL (ref 0.0–0.1)
Basophils Relative: 1.5 % (ref 0.0–3.0)
Eosinophils Absolute: 0.1 10*3/uL (ref 0.0–0.7)
Eosinophils Relative: 1.9 % (ref 0.0–5.0)
HCT: 40.1 % (ref 36.0–46.0)
Hemoglobin: 13.4 g/dL (ref 12.0–15.0)
Lymphocytes Relative: 23.8 % (ref 12.0–46.0)
Lymphs Abs: 1.7 10*3/uL (ref 0.7–4.0)
MCHC: 33.5 g/dL (ref 30.0–36.0)
MCV: 86.7 fl (ref 78.0–100.0)
Monocytes Absolute: 0.4 10*3/uL (ref 0.1–1.0)
Monocytes Relative: 5.1 % (ref 3.0–12.0)
Neutro Abs: 4.9 10*3/uL (ref 1.4–7.7)
Neutrophils Relative %: 67.7 % (ref 43.0–77.0)
Platelets: 181 10*3/uL (ref 150.0–400.0)
RBC: 4.62 Mil/uL (ref 3.87–5.11)
RDW: 14.4 % (ref 11.5–15.5)
WBC: 7.2 10*3/uL (ref 4.0–10.5)

## 2023-03-20 LAB — BASIC METABOLIC PANEL
BUN: 15 mg/dL (ref 6–23)
CO2: 27 meq/L (ref 19–32)
Calcium: 9.2 mg/dL (ref 8.4–10.5)
Chloride: 102 meq/L (ref 96–112)
Creatinine, Ser: 0.83 mg/dL (ref 0.40–1.20)
GFR: 67.68 mL/min (ref >60.00)
Glucose, Bld: 166 mg/dL — ABNORMAL HIGH (ref 70–99)
Potassium: 4.1 meq/L (ref 3.5–5.1)
Sodium: 140 meq/L (ref 135–145)

## 2023-03-20 LAB — HM DIABETES FOOT EXAM

## 2023-03-20 LAB — HEMOGLOBIN A1C: Hemoglobin A1C: 7.7

## 2023-03-20 LAB — MICROALBUMIN, URINE: Microalb, Ur: <0.7

## 2023-03-20 LAB — PROTEIN / CREATININE RATIO, URINE: Creatinine, Urine: 57

## 2023-03-20 NOTE — Assessment & Plan Note (Signed)
 Here for CPX  We also discussed the following: DM: Per Endo.  Next appointment tomorrow.   HTN: Check a BMP, CBC.  Continue atenolol, losartan, High cholesterol: On atorvastatin, controlled. Osteopenia: See AVS, bone density test if so desired. Dementia: Seems stable.  Continue Aricept, Namenda. Social: Currently at the independent living area of [facility], they recommended increase level of care, they are thinking about moving to [facility] assisted living. Adrianne, caregiver, help her with medication compliance.   I communicated via message with the patient's daughter to discuss plan of care. Multiple refills sent RTC 4 months CPX.

## 2023-03-20 NOTE — Addendum Note (Signed)
 Addended byConrad Montrose D on: 03/20/2023 11:54 AM   Modules accepted: Orders

## 2023-03-20 NOTE — Assessment & Plan Note (Signed)
 Here for CPX  -Td:  2024 -pneumovax 2009-2014;  prevnar: [phone].  PNM 20 - 2023 -Shingles shot 2010; S/p Shingrix x2 -Had a flu shot. - Recommend a COVID booster and RSV  -female care:  no h/o abnormal paps, aged out for screening but in the past daughter requested to see gynecology, will be happy to refer her if so desired  MMG   11/2022 ([facility]) -CCS: [facility] Colon cancer, Cscope : 04/18/2009, cscope again 04-2014: +polyps, colonoscopy 2021.  Last colonoscopy 2024, next per GI. -Labs: See orders -- POA on file - Message sent to the patient's daughter via MyChart.

## 2023-03-22 ENCOUNTER — Encounter: Payer: Self-pay | Admitting: Internal Medicine

## 2023-03-24 ENCOUNTER — Encounter: Payer: Self-pay | Admitting: Internal Medicine

## 2023-04-10 ENCOUNTER — Other Ambulatory Visit: Payer: Self-pay | Admitting: Internal Medicine

## 2023-04-10 ENCOUNTER — Other Ambulatory Visit: Payer: Self-pay

## 2023-04-10 ENCOUNTER — Emergency Department (HOSPITAL_COMMUNITY)

## 2023-04-10 ENCOUNTER — Encounter (HOSPITAL_COMMUNITY): Payer: Self-pay | Admitting: Emergency Medicine

## 2023-04-10 ENCOUNTER — Emergency Department (HOSPITAL_COMMUNITY)
Admission: EM | Admit: 2023-04-10 | Discharge: 2023-04-10 | Disposition: A | Discharge status: Home / Self Care | Attending: Emergency Medicine | Admitting: Emergency Medicine

## 2023-04-10 DIAGNOSIS — F0390 Unspecified dementia without behavioral disturbance: Secondary | ICD-10-CM | POA: Insufficient documentation

## 2023-04-10 DIAGNOSIS — R1013 Epigastric pain: Secondary | ICD-10-CM | POA: Insufficient documentation

## 2023-04-10 DIAGNOSIS — Z794 Long term (current) use of insulin: Secondary | ICD-10-CM | POA: Insufficient documentation

## 2023-04-10 DIAGNOSIS — Z7984 Long term (current) use of oral hypoglycemic drugs: Secondary | ICD-10-CM | POA: Insufficient documentation

## 2023-04-10 DIAGNOSIS — I10 Essential (primary) hypertension: Secondary | ICD-10-CM | POA: Insufficient documentation

## 2023-04-10 DIAGNOSIS — R55 Syncope and collapse: Secondary | ICD-10-CM | POA: Diagnosis present

## 2023-04-10 LAB — TROPONIN I (HIGH SENSITIVITY): Troponin I (High Sensitivity): 6 ng/L (ref <18)

## 2023-04-10 LAB — CBC WITH DIFFERENTIAL/PLATELET
Abs Immature Granulocytes: 0.08 10*3/uL — ABNORMAL HIGH (ref 0.00–0.07)
Basophils Absolute: 0 10*3/uL (ref 0.0–0.1)
Basophils Relative: 0 %
Eosinophils Absolute: 0.2 10*3/uL (ref 0.0–0.5)
Eosinophils Relative: 2 %
HCT: 42.9 % (ref 36.0–46.0)
Hemoglobin: 13.7 g/dL (ref 12.0–15.0)
Immature Granulocytes: 1 %
Lymphocytes Relative: 15 %
Lymphs Abs: 1.5 10*3/uL (ref 0.7–4.0)
MCH: 28.7 pg (ref 26.0–34.0)
MCHC: 31.9 g/dL (ref 30.0–36.0)
MCV: 89.7 fL (ref 80.0–100.0)
Monocytes Absolute: 0.5 10*3/uL (ref 0.1–1.0)
Monocytes Relative: 5 %
Neutro Abs: 7.8 10*3/uL — ABNORMAL HIGH (ref 1.7–7.7)
Neutrophils Relative %: 77 %
Platelets: 147 10*3/uL — ABNORMAL LOW (ref 150–400)
RBC: 4.78 MIL/uL (ref 3.87–5.11)
RDW: 13.4 % (ref 11.5–15.5)
WBC: 10.1 10*3/uL (ref 4.0–10.5)
nRBC: 0 % (ref 0.0–0.2)

## 2023-04-10 LAB — COMPREHENSIVE METABOLIC PANEL WITH GFR
ALT: 16 U/L (ref 0–44)
AST: 21 U/L (ref 15–41)
Albumin: 3.5 g/dL (ref 3.5–5.0)
Alkaline Phosphatase: 56 U/L (ref 38–126)
Anion gap: 11 (ref 5–15)
BUN: 12 mg/dL (ref 8–23)
CO2: 21 mmol/L — ABNORMAL LOW (ref 22–32)
Calcium: 8.6 mg/dL — ABNORMAL LOW (ref 8.9–10.3)
Chloride: 107 mmol/L (ref 98–111)
Creatinine, Ser: 0.79 mg/dL (ref 0.44–1.00)
GFR, Estimated: >60 mL/min (ref >60)
Glucose, Bld: 205 mg/dL — ABNORMAL HIGH (ref 70–99)
Potassium: 3.4 mmol/L — ABNORMAL LOW (ref 3.5–5.1)
Sodium: 139 mmol/L (ref 135–145)
Total Bilirubin: 1 mg/dL (ref 0.0–1.2)
Total Protein: 6.6 g/dL (ref 6.5–8.1)

## 2023-04-10 LAB — LIPASE, BLOOD: Lipase: 29 U/L (ref 11–51)

## 2023-04-10 NOTE — ED Provider Notes (Signed)
[place] EMERGENCY DEPARTMENT AT [facility] Provider Note   CSN: [id number] Arrival date & time: 04/10/23  1558     History  Chief Complaint  Patient presents with   Near Syncope    [name] is a 78 y.o. female.  Patient is a 78 year old female with a history of hypertension, hyperlipidemia, dementia, IBS, GERD who is presenting today from her assisted living facility due to sudden onset of nausea, diaphoresis and feeling dizzy.  Patient reports she was sitting in the craft room at the craft table and started feeling nauseated.  Her daughter who is her POA and who also spoke with her caregiver who was present send she suddenly started complaining of nausea and bent over.  She then became pale and diaphoretic.  She never lost consciousness but did complain of being dizzy.  They did call 911.  When paramedics arrived patient's heart rate was in the 40s with a sinus rhythm with PACs.  Her blood pressure and blood sugar were normal at that time.  They did give the patient fluid and atropine and patient heart rate did improved to the 60s and she reports now feeling better.  Her daughter reports from Monday to Wednesday she had forgotten to take all of her medications but did take her medicines last night and did take her dose this morning.  Her daughter saw her yesterday and reports that she was her normal self.  Patient feels that she was okay this morning and her caregiver did not notice anything at 1:00 when she arrived.  Patient does report she is having some mild epigastric pain but denies any further nausea.  She has not had diarrhea, cough or fever.  She denies any urinary complaints.  The history is provided by the patient.  Near Syncope       Home Medications Prior to Admission medications   Medication Sig Start Date End Date Taking? Authorizing Provider  ACCU-CHEK FASTCLIX LANCETS MISC daily. use as directed 04/26/17   [provider]  acetaminophen  (TYLENOL) 325 MG tablet Take 650 mg by mouth every 6 (six) hours as needed for mild pain or headache.    [provider]  atenolol (TENORMIN) 50 MG tablet Take 0.5 tablets (25 mg total) by mouth daily. 03/19/23   Wanda Plump, MD  atorvastatin (LIPITOR) 40 MG tablet Take 1 tablet (40 mg total) by mouth daily. 04/10/23   Wanda Plump, MD  BD VEO INSULIN SYRINGE U/F 31G X 15/64" 1 ML MISC USE TO INJECT insulin EVERY DAY 05/14/17   [provider]  cyanocobalamin (VITAMIN B12) 1000 MCG tablet Take 1 tablet (1,000 mcg total) by mouth daily. 06/26/22   Wanda Plump, MD  donepezil (ARICEPT) 10 MG tablet Take 2 tablets (20 mg total) by mouth at bedtime. 04/10/23   Wanda Plump, MD  gabapentin (NEURONTIN) 100 MG capsule Take 1 capsule (100 mg total) by mouth at bedtime. 04/10/23   Wanda Plump, MD  Insulin Pen Needle (B-D UF III MINI PEN NEEDLES) 31G X 5 MM MISC as directed SubQ use to inject insulin once a day 12/04/15   [provider]  LEVEMIR 100 UNIT/ML injection Inject into the skin. 07/16/19   [provider]  losartan (COZAAR) 25 MG tablet Take 1 tablet (25 mg total) by mouth daily. 04/10/23   Wanda Plump, MD  memantine (NAMENDA) 10 MG tablet Take 1 tablet (10 mg total) by mouth 2 (two) times  daily. 03/19/23   Wanda Plump, MD  metFORMIN (GLUCOPHAGE-XR) 500 MG 24 hr tablet TAKE 1 TABLET BY MOUTH EVERY MORNING WITH A MEAL 07/15/18   [provider]  mirabegron ER (MYRBETRIQ) 50 MG TB24 tablet Take 1 tablet by mouth daily. 05/29/20   [provider]  Multiple Vitamins-Minerals (CENTRUM SILVER ULTRA WOMENS) TABS Take 1 tablet by mouth daily. 06/26/22   Wanda Plump, MD  ONE TOUCH ULTRA TEST test strip USE TO CHECK BLOOD SUGAR EVERY DAY 10/20/17   [provider]  pantoprazole (PROTONIX) 40 MG tablet Take 1 tablet (40 mg total) by mouth 2 (two) times daily before a meal. 03/19/23   Wanda Plump, MD  sertraline (ZOLOFT) 100 MG tablet Take 1 tablet (100 mg total) by mouth  daily. 04/10/23   Wanda Plump, MD  sodium fluoride (FLUORISHIELD) 1.1 % GEL dental gel Place 1 application onto teeth at bedtime as needed (for teeth).     [provider]      Allergies    Sulfa antibiotics, Trulicity [dulaglutide], and Penicillins    Review of Systems   Review of Systems  Cardiovascular:  Positive for near-syncope.    Physical Exam Updated Vital Signs BP (!) 109/50   Pulse (!) 56   Temp (!) 97.5 F (36.4 C) (Oral)   Resp 20   SpO2 100%  Physical Exam Vitals and nursing note reviewed.  Constitutional:      General: She is not in acute distress.    Appearance: She is well-developed.  HENT:     Head: Normocephalic and atraumatic.  Eyes:     Pupils: Pupils are equal, round, and reactive to light.  Cardiovascular:     Rate and Rhythm: Regular rhythm. Bradycardia present. Occasional Extrasystoles are present.    Heart sounds: Normal heart sounds. No murmur heard.    No friction rub.  Pulmonary:     Effort: Pulmonary effort is normal.     Breath sounds: Normal breath sounds. No wheezing or rales.  Abdominal:     General: Bowel sounds are normal. There is no distension.     Palpations: Abdomen is soft.     Tenderness: There is abdominal tenderness in the epigastric area. There is no right CVA tenderness, left CVA tenderness, guarding or rebound. Negative signs include Murphy's sign.  Musculoskeletal:        General: No tenderness. Normal range of motion.     Right lower leg: No edema.     Comments: No edema  Skin:    General: Skin is warm and dry.     Findings: No rash.  Neurological:     Mental Status: She is alert. Mental status is at baseline.     Cranial Nerves: No cranial nerve deficit.     Sensory: No sensory deficit.     Motor: No weakness.     Comments: Oriented to person and place  Psychiatric:        Mood and Affect: Mood normal.        Behavior: Behavior normal.     ED Results / Procedures / Treatments   Labs (all labs ordered  are listed, but only abnormal results are displayed) Labs Reviewed  CBC WITH DIFFERENTIAL/PLATELET - Abnormal; Notable for the following components:      Result Value   Platelets 147 (*)    Neutro Abs 7.8 (*)    Abs Immature Granulocytes 0.08 (*)    All other components within normal limits  COMPREHENSIVE METABOLIC PANEL WITH GFR - Abnormal; Notable for the following components:   Potassium 3.4 (*)    CO2 21 (*)    Glucose, Bld 205 (*)    Calcium 8.6 (*)    All other components within normal limits  LIPASE, BLOOD  TROPONIN I (HIGH SENSITIVITY)    EKG EKG Interpretation Date/Time:  Thursday April 10 2023 16:22:30 EDT Ventricular Rate:  55 PR Interval:  188 QRS Duration:  109 QT Interval:  516 QTC Calculation: 494 R Axis:   -56  Text Interpretation: Sinus rhythm Atrial premature complexes Incomplete RBBB and LAFB Abnormal R-wave progression, late transition Left ventricular hypertrophy new Borderline prolonged QT interval Confirmed by Gwyneth Sprout ([id number]) on 04/10/2023 4:24:48 PM  Radiology DG Chest Port 1 View Result Date: 04/10/2023 CLINICAL DATA:  Dizziness. EXAM: PORTABLE CHEST 1 VIEW COMPARISON:  December 29, 2019. FINDINGS: The heart size and mediastinal contours are within normal limits. Both lungs are clear. Status post right total shoulder arthroplasty. IMPRESSION: No active disease. Electronically Signed   By: Lupita Raider M.D.   On: 04/10/2023 17:12    Procedures Procedures    Medications Ordered in ED Medications - No data to display  ED Course/ Medical Decision Making/ A&P                                 Medical Decision Making Amount and/or Complexity of Data Reviewed Labs: ordered. Radiology: ordered.   Pt with multiple medical problems and comorbidities and presenting today with a complaint that caries a high risk for morbidity and mortality.  Here today with episode of nausea, diaphoresis and being pale sounds like a near syncopal event.  Upon  arrival of paramedics patient's heart rate was in the 40s with sinus rhythm and PACs within normal blood pressure and blood sugar.  Concern for possible vagal response to feeling nauseated versus reaction to restarting all of her medications after being off of them for 3-1/2 days.  Also concern for possible dysrhythmia, ACS, GI issues such as GERD, pancreatitis, PUD.  Low suspicion for PE, perforation, tamponade, dissection.  On exam here patient is well-appearing and heart rates are in the 60s.  I independently interpreted patient's labs and EKG.  EKG shows a sinus rhythm with occasional PACs but no acute changes other than a borderline prolonged QT.  Troponin, lipase, CBC, CMP all without acute findings. Patient has gotten up multiple times and walk to the bathroom without difficulty and her daughter reports she looks like her normal self.  At this time we will discharge patient home however her blood pressures have been on the lower side 100s over 50s and discussed with her holding her evening time blood pressure medication and just continuing the atenolol at this time and following up with her PCP.  Also cautioned them about any further episodes she should return to the emergency room or if she develops chest pain or shortness of breath.  She and her daughter are comfortable with this plan and at this time she appears stable for discharge does not appear to require admission.  She has been eating and drinking here and is well-appearing at this time.        Final Clinical Impression(s) / ED Diagnoses Final diagnoses:  None    Rx / DC Orders ED Discharge Orders     None         Gwyneth Sprout, MD 04/10/23  2116  

## 2023-04-10 NOTE — ED Notes (Signed)
 PT up talking to provider

## 2023-04-10 NOTE — ED Notes (Signed)
 Pts daughter is driving her home. Pt and daughter verbalized understanding of d/c instructions and follow up care.

## 2023-04-10 NOTE — Discharge Instructions (Addendum)
 Hold your evening blood pressure medication and continue to follow your blood pressure.  If it does not go above 130/80 then continue to hold that medicine until you follow-up with your doctor.  If you have any further episodes like today return to the emergency room.  All the blood work today was reassuring and EKG shows that you do have a slow heart rate with some extra beats but no signs of abnormal rhythm.

## 2023-04-10 NOTE — ED Triage Notes (Signed)
 Pt BIBA from SNF ([facility]). Pt was in the crafts room when she suddently became very dizzy, diaphoretic, and pale. Pt initial HR 46 w/ the rest of the VS were normal. Pt never loss conciousness but felt like she was going to. Hx: dementia.   EMS  CBG 163 18G L AC of NS  1mg  of atropine

## 2023-04-16 ENCOUNTER — Encounter: Payer: Self-pay | Admitting: Internal Medicine

## 2023-04-22 ENCOUNTER — Encounter: Payer: Self-pay | Admitting: Internal Medicine

## 2023-04-22 DIAGNOSIS — H18519 Endothelial corneal dystrophy, unspecified eye: Secondary | ICD-10-CM | POA: Insufficient documentation

## 2023-05-01 ENCOUNTER — Other Ambulatory Visit: Payer: Self-pay | Admitting: Internal Medicine

## 2023-05-02 ENCOUNTER — Encounter: Payer: Self-pay | Admitting: Internal Medicine

## 2023-05-02 DIAGNOSIS — E118 Type 2 diabetes mellitus with unspecified complications: Secondary | ICD-10-CM

## 2023-05-02 DIAGNOSIS — F03B0 Unspecified dementia, moderate, without behavioral disturbance, psychotic disturbance, mood disturbance, and anxiety: Secondary | ICD-10-CM

## 2023-05-06 NOTE — Addendum Note (Signed)
 Addended by: Ezell Hollow on: 05/06/2023 08:31 AM   Modules accepted: Orders

## 2023-05-08 ENCOUNTER — Telehealth: Payer: Self-pay

## 2023-05-08 NOTE — Progress Notes (Signed)
 Complex Care Management Note  Care Guide Note 05/08/2023 Name: PRYSCILLA AMANN MRN: [id number] DOB: 09-04-1945  Felecia Nard Hiemstra is a 78 y.o. year old female who sees Neomi Banks, Anitra Ket, MD for primary care. I reached out to Jacklin Mascot [place] by phone today to offer complex care management services.  Ms. Olesen was given information about Complex Care Management services today including:   The Complex Care Management services include support from the care team which includes your Nurse Care Manager, Clinical Social Worker, or Pharmacist.  The Complex Care Management team is here to help remove barriers to the health concerns and goals most important to you. Complex Care Management services are voluntary, and the patient may decline or stop services at any time by request to their care team member.   Complex Care Management Consent Status: Patient agreed to services and verbal consent obtained.   Follow up plan:  Telephone appointment with complex care management team member scheduled for:  05/19/23 at 1:00 p.m.   Encounter Outcome:  Patient Scheduled  [facility] Care Management Assistant Direct Dial: [phone]  Fax: [phone]

## 2023-05-19 ENCOUNTER — Ambulatory Visit (INDEPENDENT_AMBULATORY_CARE_PROVIDER_SITE_OTHER): Admitting: Pharmacist

## 2023-05-19 ENCOUNTER — Encounter: Payer: Self-pay | Admitting: Pharmacist

## 2023-05-19 DIAGNOSIS — Z79899 Other long term (current) drug therapy: Secondary | ICD-10-CM

## 2023-05-19 DIAGNOSIS — F03B0 Unspecified dementia, moderate, without behavioral disturbance, psychotic disturbance, mood disturbance, and anxiety: Secondary | ICD-10-CM

## 2023-05-19 MED ORDER — PANTOPRAZOLE SODIUM 40 MG PO TBEC: 40.0000 mg | DELAYED_RELEASE_TABLET | Freq: Every morning | ORAL | 1 refills | Status: AC

## 2023-05-19 NOTE — Progress Notes (Signed)
 05/19/2023 Name: [name] MRN: [id number] DOB: 10/18/1945  Chief Complaint  Patient presents with   Medication Management    [name] is a 78 y.o. year old female whose daughter [name] presented for a telephone visit.   They were referred to the pharmacist by their PCP for assistance in managing complex medication management.   Subjective:  Care Team: Primary Care Provider: Ezell Hollow, Santiago ; Next Scheduled Visit: N/A Gastroenterologist: Dr. Wayna Santiago; Next Scheduled Visit: N/A Podiatrist: Dr. Russ Santiago; Next Scheduled Visit: N/A  Medication Access/Adherence  Current Pharmacy:  [facility] [place], [place] - [address] [place] [address] Phone: [phone] Fax: [phone]  [facility] - [place], [place] - [address] [facility] [address] [place] [address] Phone: [phone] Fax: [phone]   Patient reports affordability concerns with their medications: No  Patient reports access/transportation concerns to their pharmacy: No  Patient reports adherence concerns with their medications:  Yes   Medication Management:  Current adherence strategy: [facility]  Recent fill dates: 04/25/2023 with [facility] - on a monthly cycle  Current [facility] Dosing Schedule Morning Memantine  10 mg Aspirin  EC 81 mg Atenolol  25 mg Centrum Silver  Multivitamin - 1 tablet Vitron C 65-125 mg Vitamin B12 1000 mcg Pantoprazole  40 mg  6pm Memantine  10 mg Pantoprazole  40 mg Losartan  25 mg Metformin  500 mg  Bedtime Donepezil  20 mg (2 tablets) Gabapentin  100 mg Atorvastatin  40 mg Sertraline  100 mg  *Also taking Farxiga 5mg  but daughter unsure of what time of day this is dosed. FYI - patient was no longer taking insulin , so she was switched to Farxiga.  Daughter [name] concerned for adherence due to patient's progressing dementia. She is interested in consolidating  medications from three times a day dosing to twice daily dosing as patient may require increased level of care and daughter would like to simplify the regimen before that happens.  Daughter also is interested in whether she should be holding patient's atenolol  given recent ED visit on 04/10/23 for near syncope with HR found to be in the 40s. ED provider instructed to hold atenolol , but no clear duration provided so daughter only held for 1 dose. Daughter states patient had an episode of atrial fibrillation after the ED visit, so she will be scheduling a cardiology visit soon. Patient's HR and BP is not currently being monitored at home.  Objective:  Lab Results  Component Value Date   HGBA1C 7.7 03/20/2023    Lab Results  Component Value Date   CREATININE 0.79 04/10/2023   BUN 12 04/10/2023   NA 139 04/10/2023   K 3.4 (L) 04/10/2023   CL 107 04/10/2023   CO2 21 (L) 04/10/2023    Lab Results  Component Value Date   CHOL 129 07/03/2022   HDL 44.60 07/03/2022   LDLCALC 53 03/23/2020   LDLDIRECT 58.0 07/03/2022   TRIG 240.0 (Santiago) 07/03/2022   CHOLHDL 3 07/03/2022    Medications Reviewed Today     Reviewed by [name], RPH (Pharmacist) on 05/19/23 at 1406  Med List Status: <None>   Medication Order Taking? Sig Documenting Provider Last Dose Status Informant  ACCU-CHEK FASTCLIX LANCETS MISC [id number]  daily. use as directed Provider, Historical, Santiago  Active   acetaminophen  (TYLENOL ) 325 MG tablet [id number]  Take 650 mg by mouth every 6 (six) hours as needed for mild pain or headache. Provider, Historical, Santiago  Active Self  aspirin  EC 81 MG tablet [id number] Yes Take 81 mg by mouth daily. Swallow whole. Provider, Historical, Santiago  Active   atenolol  (TENORMIN ) 25 MG tablet [id number] Yes Take 25 mg by mouth daily. Provider, Historical, Santiago  Active   atorvastatin  (LIPITOR) 40 MG tablet [id number] Yes Take 1 tablet (40 mg total) by mouth daily. Paz, [name]  Active   cyanocobalamin   (VITAMIN B12) 1000 MCG tablet [id number] Yes Take 1 tablet (1,000 mcg total) by mouth daily. Paz, [name]  Active   dapagliflozin propanediol (FARXIGA) 5 MG TABS tablet [id number] Yes Take 5 mg by mouth daily. Provider, Historical, Santiago  Active   donepezil  (ARICEPT ) 10 MG tablet [id number] Yes Take 2 tablets (20 mg total) by mouth at bedtime. Paz, [name]  Active   gabapentin  (NEURONTIN ) 100 MG capsule [id number] Yes Take 1 capsule (100 mg total) by mouth at bedtime. Paz, [name]  Active   Iron -Vitamin C  65-125 MG TABS [id number] Yes Take 1 tablet by mouth daily. Provider, Historical, Santiago  Active   losartan  (COZAAR ) 25 MG tablet [id number] Yes Take 1 tablet (25 mg total) by mouth daily. Paz, [name]  Active   memantine  (NAMENDA ) 10 MG tablet [id number] Yes Take 1 tablet (10 mg total) by mouth 2 (two) times daily. Paz, [name]  Active   metFORMIN  (GLUCOPHAGE -XR) 500 MG 24 hr tablet [id number] Yes TAKE 1 TABLET BY MOUTH EVERY MORNING WITH A MEAL Provider, Historical, Santiago  Active   mirabegron ER (MYRBETRIQ) 50 MG TB24 tablet [id number]  Take 1 tablet by mouth daily.  Patient not taking: Reported on 05/19/2023   Provider, Historical, Santiago  Active            Med Note [name], [name]   Mon May 19, 2023  1:14 PM) Has not been taking for a while, but patient having issues with incontinence so daughter to take pt back to Santiago to see if she needs it  Multiple Vitamins-Minerals (CENTRUM SILVER  ULTRA WOMENS) TABS [id number] Yes Take 1 tablet by mouth daily. Lindsay Hollow, Santiago  Active   ONE TOUCH ULTRA TEST test strip [id number]  USE TO CHECK BLOOD SUGAR EVERY DAY Provider, Historical, Santiago  Active   pantoprazole  (PROTONIX ) 40 MG tablet [id number]  Take 1 tablet (40 mg total) by mouth in the morning. Paz, [name]  Active   sertraline  (ZOLOFT ) 100 MG tablet [id number] Yes Take 1 tablet (100 mg total) by mouth daily. Paz, [name]  Active   sodium fluoride (FLUORISHIELD) 1.1 % GEL dental gel [id number]  Place 1  application onto teeth at bedtime as needed (for teeth).  Provider, Historical, Santiago  Active Self             Assessment/Plan:  Medication Management - Daughter agreeable to new dosing schedule as listed below. Also agreeable to monitoring acid reflux on once daily pantoprazole  rather than twice daily. Will send in new prescription with updated directions. - Called [facility] to coordinate changes in timing of doses. - Recommended to monitor patient's heart rate at home. Could not find BP cuff at home, so daughter will be coming by the office to pick up a cuff. Agreed with daughter that a cardiology visit would be most beneficial for determining atenolol  plans.  NEW [facility] dosing schedule Morning Memantine  10 mg Aspirin  EC 81 mg Atenolol  25 mg Centrum Silver  Multivitamin - 1 tablet Vitron C 65-125 mg Vitamin B12 1000 mcg  Pantoprazole  40 mg Metformin  500 mg Farxiga 5 mg  Bedtime Donepezil  20 mg (2 tablets) Gabapentin  100 mg Atorvastatin  40 mg Sertraline  100 mg Memantine  10 mg Losartan  25 mg  Follow Up Plan: 6/16  Abelina Abide, PharmD PGY1 Pharmacy Resident 05/19/2023 3:55 PM  Cecilie Coffee, PharmD Clinical Pharmacist [facility]

## 2023-05-20 ENCOUNTER — Telehealth: Payer: Self-pay

## 2023-05-20 MED ORDER — ASPIRIN 81 MG PO TBEC: 81.0000 mg | DELAYED_RELEASE_TABLET | Freq: Every day | ORAL | 1 refills | Status: DC

## 2023-05-20 NOTE — Telephone Encounter (Signed)
 Rx sent.

## 2023-05-20 NOTE — Telephone Encounter (Signed)
 Copied from CRM [id number]. Topic: Clinical - Medication Question >> May 20, 2023 12:33 PM Caliyah H wrote: Reason for CRM: Nyaijah from [facility] called requesting a refill for ASA 81 mg on behalf of the patient.

## 2023-05-21 ENCOUNTER — Telehealth: Payer: Self-pay | Admitting: Pharmacist

## 2023-05-21 NOTE — Telephone Encounter (Signed)
 Received call from [facility] regarding updated trays for pateint's [facility] medication system.  Michelle from [facility] called me. She has made changed to the medication trays and they will be shipped to patient. Once patient received shipment, her daughter is to contact [facility] so that the device can be programmed with the new regimen.   Moira Andrews with [employer] has left message with [name], Mrs. Waag daughter.   Cecilie Coffee, PharmD Clinical Pharmacist [facility]  Population Health [phone]

## 2023-06-04 ENCOUNTER — Telehealth: Payer: Self-pay

## 2023-06-04 NOTE — Telephone Encounter (Signed)
 Received FL2 form from [facility]- Pt moving into assisted living. FL2 form placed in PCP red folder to be signed.

## 2023-06-05 ENCOUNTER — Encounter: Payer: Self-pay | Admitting: Internal Medicine

## 2023-06-06 NOTE — Telephone Encounter (Signed)
 Copied from CRM [id number]. Topic: Medical Record Request - Other >> Jun 06, 2023  2:08 PM Dewanda Foots wrote: Reason for CRM: Glenora Laos from [facility] is calling to check on the status of the [facility] form that she is waiting on the PCP to sign. Called the CAL but was placed on hold and did not come back for 3+ minutes so I let Glenora Laos know we will have someone call her to discuss.  I did see that the handicap card was mailed out but nothing new about the FL-2 form being completed yet.  Please fax to [phone]  Glenora Laos can be reached at-[phone]

## 2023-06-06 NOTE — Telephone Encounter (Signed)
 Completed forms faxed back to [facility] at [phone]. Form sent for scanning.

## 2023-06-06 NOTE — Telephone Encounter (Signed)
 Spoke w/ Glenora Laos- informed that FL2 was faxed this morning, she confirmed that she did receive it.

## 2023-06-10 ENCOUNTER — Encounter: Payer: Self-pay | Admitting: Pharmacist

## 2023-06-10 NOTE — Progress Notes (Signed)
 Pharmacy Quality Measure Review  This patient is appearing on a report for being at risk of failing the adherence measure for cholesterol (statin), diabetes, and hypertension (ACEi/ARB) medications this calendar year.   Medication: losartan  Last fill date: 04/04/2023 for 28 day supply  Medication: Farxiga 5mg  Last fill date: 04/04/2023 for 28 day supply  Medication: metformin   Last fill date: 04/04/2023 for 28 day supply  Medication: atorvastatin   Last fill date: 04/04/2023 for 28 day supply  Reviewed Dr Anson Basta Database - patient has filled above medications twice since 04/04/2023 - 04/26/2023 and 05/21/2023.  She is currently using the [facility] system.   Insurance report was not up to date. No action needed at this time.   Cecilie Coffee, PharmD Clinical Pharmacist [facility] Primary Care  Population Health [phone]

## 2023-06-16 ENCOUNTER — Encounter: Payer: Self-pay | Admitting: Internal Medicine

## 2023-06-16 ENCOUNTER — Ambulatory Visit (INDEPENDENT_AMBULATORY_CARE_PROVIDER_SITE_OTHER): Admitting: Internal Medicine

## 2023-06-16 VITALS — BP 128/86 | HR 56 | Temp 97.9°F | Resp 18 | Ht 65.0 in | Wt 169.1 lb

## 2023-06-16 DIAGNOSIS — R7989 Other specified abnormal findings of blood chemistry: Secondary | ICD-10-CM | POA: Diagnosis not present

## 2023-06-16 DIAGNOSIS — E538 Deficiency of other specified B group vitamins: Secondary | ICD-10-CM | POA: Diagnosis not present

## 2023-06-16 DIAGNOSIS — E118 Type 2 diabetes mellitus with unspecified complications: Secondary | ICD-10-CM

## 2023-06-16 DIAGNOSIS — I10 Essential (primary) hypertension: Secondary | ICD-10-CM

## 2023-06-16 DIAGNOSIS — W19XXXD Unspecified fall, subsequent encounter: Secondary | ICD-10-CM

## 2023-06-16 DIAGNOSIS — I499 Cardiac arrhythmia, unspecified: Secondary | ICD-10-CM | POA: Diagnosis not present

## 2023-06-16 DIAGNOSIS — E785 Hyperlipidemia, unspecified: Secondary | ICD-10-CM

## 2023-06-16 NOTE — Patient Instructions (Addendum)
   Check the  blood pressure regularly Blood pressure goal:  between 110/65 and  135/85. If it is consistently higher or lower, let me know     GO TO THE LAB :  Get the blood work   Your results will be posted on MyChart with my comments  Next office visit for a checkup in 3 months Please make an appointment before you leave today

## 2023-06-16 NOTE — Progress Notes (Unsigned)
 Subjective:    Patient ID: [name], female    DOB: [birth year], 78 y.o.   MRN: [id number]  DOS:  06/16/2023 Type of visit - description: Acute, here with her daughter [name]  Here for follow-up.  Went to the ER, urgent care, extensive chart review.  ER 04/10/2023, was evaluated for a near syncopal event. EMS report heart rate in the 40s, normal BP and blood sugar. EKG then showed sinus rhythm with pacs. blood work: Platelets 147 slightly low, blood sugar 205, potassium 3.4, troponin negative  ~April 17, 2023: She was dining with her daughter, the patient suddenly reported palpitations.  She was slightly lightheaded and sweaty. One of her friends, has a history of A-fib, let her use a device (?Kardia) it showed a HR in the 170s and atrial fibrillation x 2 and eventually normal sinus rhythm.  Symptoms subsequently subsided.  Urgent care 06/09/2023: Presented after a fall, landed on her buttocks, apparently a mechanical fall. Had low back pain, sacral pain after the fall. Initially at the urgent care, A-fib was noted on the monitor. Subsequently EKG shows sinus bradycardia with PVCs. X-rays lumbar and sacral spine DJD changes, no fracture.   At this point is here for follow-up. Feeling okay. No palpitations, no shortness of breath.   Review of Systems See above   Past Medical History:  Diagnosis Date   Adenomatous colon polyp 03/1988   Anemia    borderline   Anxiety    Arthritis    R shoulder - degenerative    Chest pain     Sept 2011: stress test neg   Depression    sees Dr.Cotle   Diabetes mellitus    dr Kathyanne Parkers   Diverticulosis    Fatty liver    Increased LFTs, saw GI 06/2011, likely from fatty liver    Fuchs' corneal dystrophy    GERD (gastroesophageal reflux disease)    History of hiatal hernia    Hyperlipemia    Hypertension    IBS (irritable bowel syndrome)    and dyspepsia   Memory impairment 08/2014   mild cognitive impairment vs. mild dementia,  reommended reinstating of cholinesterase inhibitor    Osteopenia    dexa 06/2007 and 10/11 Rx CA vitamin d    Osteopenia    RLS (restless legs syndrome)    h/o- no longer using Requip   Sleep apnea    Mouth guard use at home   Thyroid  nodule     Past Surgical History:  Procedure Laterality Date   BREAST BIOPSY Left 10/11/2015   fibroadenoma w/ calcifications, no evidence of malignancy, recommend mammogram repeat-6 mnths   COLONOSCOPY  04/15/2014   JOINT REPLACEMENT     POLYPECTOMY     REVERSE SHOULDER ARTHROPLASTY Right 04/21/2014   Procedure: REVERSE SHOULDER ARTHROPLASTY;  Surgeon: Sammye Cristal, MD;  Location: [facility] OR;  Service: Orthopedics;  Laterality: Right;  Right reverse total shoulder replacement   TONSILLECTOMY     UTERINE FIBROID EMBOLIZATION     90s    Current Outpatient Medications  Medication Instructions   ACCU-CHEK FASTCLIX LANCETS MISC Daily   acetaminophen  (TYLENOL ) 650 mg, Every 6 hours PRN   aspirin  EC 81 mg, Oral, Daily, Swallow whole.   atenolol  (TENORMIN ) 25 mg, Daily   atorvastatin  (LIPITOR) 40 mg, Oral, Daily   cyanocobalamin  (VITAMIN B12) 1,000 mcg, Oral, Daily   dapagliflozin propanediol (FARXIGA) 5 mg, Daily   donepezil  (ARICEPT ) 20 mg, Oral, Daily at bedtime   gabapentin  (NEURONTIN ) 100 mg,  Oral, Daily at bedtime   Iron -Vitamin C  65-125 MG TABS 1 tablet, Daily   losartan  (COZAAR ) 25 mg, Oral, Daily   memantine  (NAMENDA ) 10 mg, Oral, 2 times daily   metFORMIN  (GLUCOPHAGE -XR) 500 MG 24 hr tablet TAKE 1 TABLET BY MOUTH EVERY MORNING WITH A MEAL   mirabegron ER (MYRBETRIQ) 50 MG TB24 tablet 1 tablet, Daily   Multiple Vitamins-Minerals (CENTRUM SILVER  ULTRA WOMENS) TABS 1 tablet, Oral, Daily   ONE TOUCH ULTRA TEST test strip USE TO CHECK BLOOD SUGAR EVERY DAY   pantoprazole  (PROTONIX ) 40 mg, Oral, Every morning   sertraline  (ZOLOFT ) 100 mg, Oral, Daily   sodium fluoride (FLUORISHIELD) 1.1 % GEL dental gel 1 application , At bedtime PRN        Objective:   Physical Exam BP 128/86   Pulse (!) 56   Temp 97.9 F (36.6 C) (Oral)   Resp 18   Ht 5\' 5"  (1.651 m)   Wt 169 lb 2 oz (76.7 kg)   SpO2 96%   BMI 28.14 kg/m  General:   Well developed, NAD, BMI noted. HEENT:  Normocephalic . Face symmetric, atraumatic Lungs:  CTA B Normal respiratory effort, no intercostal retractions, no accessory muscle use. Heart: Frequent irregular beats.   Lower extremities: no pretibial edema bilaterally  Skin: Not pale. Not jaundice Neurologic:  alert & recently demented.  Speech normal, gait appropriate for age assisted by cane psych--    Behavior appropriate. No anxious or depressed appearing.      Assessment   ASSESSMENT DM Dr. Kathyanne Parkers HTN Hyperlipidemia H/o SIADH Depression seen elsewhere Dementia: Dr Blain Bulls  OSA -- mild, also told RLS (Rx requip); saw Dr Bennetta Braun 2014, no CPAP GI: --IBS, colon polyps, diverticulosis, hiatal hernia --Chronic constipation likely part of her IBS syndrome  --Fatty liver >>> GI eval 2013 --iron  fec anemia:  cscope 04-2014 : 2 polyps; + hemocult @ GI office 10-2014: EGD done (-), bx neg Osteopenia: T score -1.1  2009 , osteopenia again per dexa 05-2013, t score -1.8 (10-09-17), scanned, rx ca-vit d B12 deficiency  RLS MSK: --DJD frozen shoulder Dizziness: chronic, carotid us  2016 neg, saw cards-- not likely CV related; saw neuro DR Blain Bulls 02-2015  Thyroid  nodules: Incidental   by Carotid us , BX 11-2014: Atypical findings, [facility] III, f/u  Endocrine RLL Lung nodule: Stable per CT 09-2016, no need for further B/B incontinence:  uses diapers     PLAN: Arrhythmia: Since April has been evaluated at the ER, by the family and subsequently at the urgent care. They have noted bradycardia, atrial fibrillation, frequent PVCs. Today she is asymptomatic, BP is satisfactory, heart rate is 56. EKG today: Sinus rhythm, frequent PACs, no obvious ischemic changes Plan: BMP, magnesium.  Referred to cardiology for  further evaluation of arrhythmias. Suppressed TSH: Check TFTs. B12 deficiency, check B12 and folic acid levels. RTC 3 months

## 2023-06-17 LAB — BASIC METABOLIC PANEL WITH GFR
BUN/Creatinine Ratio: 15 (calc) (ref 6–22)
BUN: 16 mg/dL (ref 7–25)
CO2: 28 mmol/L (ref 20–32)
Calcium: 9.2 mg/dL (ref 8.6–10.4)
Chloride: 100 mmol/L (ref 98–110)
Creat: 1.08 mg/dL — ABNORMAL HIGH (ref 0.60–1.00)
Glucose, Bld: 179 mg/dL — ABNORMAL HIGH (ref 65–99)
Potassium: 4.4 mmol/L (ref 3.5–5.3)
Sodium: 138 mmol/L (ref 135–146)
eGFR: 53 mL/min/{1.73_m2} — ABNORMAL LOW (ref >=60)

## 2023-06-17 LAB — B12 AND FOLATE PANEL
Folate: 14.9 ng/mL
Vitamin B-12: 894 pg/mL (ref 200–1100)

## 2023-06-17 LAB — T3, FREE: T3, Free: 3.1 pg/mL (ref 2.3–4.2)

## 2023-06-17 LAB — T4, FREE: Free T4: 1.2 ng/dL (ref 0.8–1.8)

## 2023-06-17 LAB — TSH: TSH: 0.17 m[IU]/L — ABNORMAL LOW (ref 0.40–4.50)

## 2023-06-17 LAB — MAGNESIUM: Magnesium: 1.9 mg/dL (ref 1.5–2.5)

## 2023-06-17 NOTE — Assessment & Plan Note (Signed)
 Arrhythmia: Since April she has been seen at the ER and urgent care.  . They have noted bradycardia, atrial fibrillation, frequent PVCs. Also a friend let her use her Kardia device and it showes atrial fibrillation, see HPI. Today she is asymptomatic, BP is satisfactory, heart rate is 56. EKG today: Sinus rhythm, frequent PACs, no obvious ischemic changes Plan: BMP, magnesium.  Refer to cardiology for further evaluation of arrhythmias. Suppressed TSH: Check TFTs. B12 deficiency, check B12 and folic acid levels. RTC 3 months

## 2023-06-18 ENCOUNTER — Other Ambulatory Visit

## 2023-06-18 DIAGNOSIS — E118 Type 2 diabetes mellitus with unspecified complications: Secondary | ICD-10-CM

## 2023-06-19 LAB — MICROALBUMIN / CREATININE URINE RATIO
Creatinine, Urine: 27 mg/dL (ref 20–275)
Microalb Creat Ratio: 7 mg/g{creat} (ref <30)
Microalb, Ur: 0.2 mg/dL

## 2023-06-20 ENCOUNTER — Telehealth: Payer: Self-pay

## 2023-06-20 ENCOUNTER — Ambulatory Visit: Payer: Self-pay | Admitting: Internal Medicine

## 2023-06-20 ENCOUNTER — Telehealth: Payer: Self-pay | Admitting: Internal Medicine

## 2023-06-20 NOTE — Telephone Encounter (Signed)
 Copied from CRM [id number]. Topic: Referral - Request for Referral >> Jun 20, 2023  9:54 AM Lajean Pike wrote: Did the patient discuss referral with their provider in the last year? Yes (If No - schedule appointment) (If Yes - send message)  Appointment offered? No  Type of order/referral and detailed reason for visit: Patient received a referral to [facility] initially. Patient would like a referral to [facility] Cardiology office.   Preference of office, provider, location: [facility] Cardiology office   If referral order, have you been seen by this specialty before? No (If Yes, this issue or another issue? When? Where?  Can we respond through MyChart? Yes

## 2023-06-20 NOTE — Telephone Encounter (Signed)
 ST and PT plans of care faxed back to [facility] at [phone]. Forms sent for scanning.

## 2023-06-20 NOTE — Telephone Encounter (Signed)
 Referral reviewed, was sent to [facility].

## 2023-06-23 ENCOUNTER — Ambulatory Visit (INDEPENDENT_AMBULATORY_CARE_PROVIDER_SITE_OTHER): Admitting: Pharmacist

## 2023-06-23 DIAGNOSIS — K219 Gastro-esophageal reflux disease without esophagitis: Secondary | ICD-10-CM

## 2023-06-23 DIAGNOSIS — Z79899 Other long term (current) drug therapy: Secondary | ICD-10-CM

## 2023-06-23 NOTE — Progress Notes (Signed)
 06/23/2023 Name: [name] MRN: [id number] DOB: 1945/06/09  Chief Complaint  Patient presents with   Medication Management    [name] is a 78 y.o. year old female whose daughter [name] presented for a telephone visit.   They were referred to the pharmacist by their PCP for assistance in managing complex medication management.   Subjective:  Care Team: Primary Care Provider: Ezell Hollow, MD ; Next Scheduled Visit: N/A Gastroenterologist: Dr. Wayna Hails; Next Scheduled Visit: N/A Podiatrist: Dr. Russ Course; Next Scheduled Visit: N/A  Medication Access/Adherence  Current Pharmacy:  [facility] - [place], [place] - [address] [place] [address] Phone: [phone] Fax: [phone]   Patient reports affordability concerns with their medications: No  Patient reports access/transportation concerns to their pharmacy: No  Patient reports adherence concerns with their medications:  Yes  but this has improved. Patient has moved into assisted living and has staff administer medications to her.  Medication Management:  Current adherence strategy:  medication technician  Morning Memantine  10 mg Aspirin  EC 81 mg Atenolol  25 mg Centrum Silver  Multivitamin - 1 tablet Vitron C 65-125 mg Vitamin B12 1000 mcg Pantoprazole  40 mg Metformin  500 mg Farxiga 5 mg Myrbetriq 50mg    Bedtime Donepezil  20 mg (2 tablets) Gabapentin  100 mg Atorvastatin  40 mg Sertraline  100 mg Memantine  10 mg Losartan  25 mg  Patient also restarted Myrbetriq. Her daughter reports she feels like bladder issues are improved. [name] will see urogynocologist in the next 1 to 2 week for assessment.   At our last visit lowered dose of pantoprazole  40mg  from twice a day to once daily. Per daughter patient has not complained of increase in reflux. Tolerating lower dose well.    Objective:  Lab Results  Component Value Date   HGBA1C 7.7  03/20/2023    Lab Results  Component Value Date   CREATININE 1.08 (H) 06/16/2023   BUN 16 06/16/2023   NA 138 06/16/2023   K 4.4 06/16/2023   CL 100 06/16/2023   CO2 28 06/16/2023    Lab Results  Component Value Date   CHOL 129 07/03/2022   HDL 44.60 07/03/2022   LDLCALC 53 03/23/2020   LDLDIRECT 58.0 07/03/2022   TRIG 240.0 (H) 07/03/2022   CHOLHDL 3 07/03/2022    Medications Reviewed Today     Reviewed by Cecilie Coffee, RPH-CPP (Pharmacist) on 06/23/23 at 1311  Med List Status: <None>   Medication Order Taking? Sig Documenting Provider Last Dose Status Informant  ACCU-CHEK FASTCLIX LANCETS MISC [id number]  daily. use as directed [provider]  Active   acetaminophen  (TYLENOL ) 325 MG tablet [id number]  Take 650 mg by mouth every 6 (six) hours as needed for mild pain or headache. [provider]  Active Self  aspirin  EC 81 MG tablet [id number] Yes Take 1 tablet (81 mg total) by mouth daily. Swallow whole. Paz, Jose E, MD  Active   atenolol  (TENORMIN ) 25 MG tablet [id number] Yes Take 25 mg by mouth daily. [provider]  Active   atorvastatin  (LIPITOR) 40 MG tablet [id number] Yes Take 1 tablet (40 mg total) by mouth daily. Paz, Jose E, MD  Active   cyanocobalamin  (VITAMIN B12) 1000 MCG tablet [id number] Yes Take 1 tablet (1,000 mcg total) by mouth daily. Paz, Jose E, MD  Active   dapagliflozin propanediol (FARXIGA) 5 MG TABS tablet [id number] Yes Take 5 mg by mouth daily. [provider]  Active   donepezil  (  ARICEPT ) 10 MG tablet [id number] Yes Take 2 tablets (20 mg total) by mouth at bedtime. Ezell Hollow, MD  Active   gabapentin  (NEURONTIN ) 100 MG capsule [id number] Yes Take 1 capsule (100 mg total) by mouth at bedtime. Paz, Jose E, MD  Active   Iron -Vitamin C  65-125 MG TABS [id number]  Take 1 tablet by mouth daily. [provider]  Active   losartan  (COZAAR ) 25 MG tablet [id number] Yes Take 1 tablet (25 mg total) by mouth daily. Paz, Jose E,  MD  Active   memantine  (NAMENDA ) 10 MG tablet [id number] Yes Take 1 tablet (10 mg total) by mouth 2 (two) times daily. Paz, Jose E, MD  Active   metFORMIN  (GLUCOPHAGE -XR) 500 MG 24 hr tablet [id number] Yes TAKE 1 TABLET BY MOUTH EVERY MORNING WITH A MEAL [provider]  Active   mirabegron ER (MYRBETRIQ) 50 MG TB24 tablet [id number] Yes Take 1 tablet by mouth daily. [provider]  Active            Med Note Vicenta Graft, JESSICA H   Mon May 19, 2023  1:14 PM) Has not been taking for a while, but patient having issues with incontinence so daughter to take pt back to MD to see if she needs it  Multiple Vitamins-Minerals (CENTRUM SILVER  ULTRA WOMENS) TABS [id number] Yes Take 1 tablet by mouth daily. Ezell Hollow, MD  Active   ONE TOUCH ULTRA TEST test strip [id number]  USE TO CHECK BLOOD SUGAR EVERY DAY [provider]  Active   pantoprazole  (PROTONIX ) 40 MG tablet [id number] Yes Take 1 tablet (40 mg total) by mouth in the morning. Paz, Jose E, MD  Active   sertraline  (ZOLOFT ) 100 MG tablet [id number] Yes Take 1 tablet (100 mg total) by mouth daily. Paz, Jose E, MD  Active   sodium chloride  (MURO 128) 5 % ophthalmic solution [id number] Yes Place 1 drop into both eyes every 4 (four) hours as needed. [provider]  Active   sodium fluoride (FLUORISHIELD) 1.1 % GEL dental gel [id number]  Place 1 application onto teeth at bedtime as needed (for teeth).  [provider]  Active Self             Assessment/Plan:  Medication Management - Continue current medication regimen. Patient now has med tech to assist with medication administration.   Follow Up Plan: No follow up needed currently but patient or her daughter will reach out to Clinical Pharmacist Practitioner if they have any questions or concerns about medications.   Cecilie Coffee, PharmD Clinical Pharmacist [facility]

## 2023-06-26 NOTE — Progress Notes (Signed)
 New Patient Evaluation and Consultation  Referring Provider: Amon Aloysius BRAVO, MD PCP: Amon Aloysius BRAVO, MD Date of Service: 06/27/2023  SUBJECTIVE Chief Complaint: New Patient (Initial Visit) [name] is a 78 y.o. female here today for urinary incontinence.)  History of Present Illness: [name] is a 78 y.o. White or Caucasian female seen in consultation at the request of Dr Amon for evaluation of urinary incontinence.    Daughter Kelia [facility]) present for history taking Urinary leakage requiring diaper use around 6 years ago.  Denies changes in medications, surgery, falls. Started mirabegron around 2019 (?) stopped around 3 years ago for unclear indication, worsened 6 months ago. Unclear if she is still using mirabegron.  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   ED evaluation on 04/10/23 for near syncope, nausea, diaphoresis, and palpitations after forgetting to take her medications. EKG with PACs and negative troponins. Discharged with PCP follow-up after symptoms improved.    Review of records significant for: Dementia on Aricept , T2DM with last HbA1C 7.7 in 03/20/23, history of hiatal hernia  Urinary Symptoms: Leaks urine with cough/ sneeze, laughing, exercise, going from sitting to standing, with a full bladder, with movement to the bathroom, with urgency, and while asleep More bothered by urgency leakage, 5 leaks/day and 1-2 leaks/night with urgency. Reports sheet changes at bedtime Reports minimal leg swelling Stops fluid intake around 5:30pm, sleeps around 10pm Leaks 5-6 time(s) per days with daytime and night time leakage Pad use: 6 adult diapers per day.   Patient is bothered by UI symptoms.  Day time voids 6.  Nocturia: 2 times per night to void with OSA and RLS.  Denies CPAP use, managed with mouth piece Voiding dysfunction:  empties bladder well.  Patient does not use a catheter to empty bladder.  When urinating, patient feels a weak stream, difficulty starting urine stream, and the need to urinate multiple times in a row Drinks: 32-48oz water  per day, 12oz arnold palmer  UTIs: 1-2 UTI's in the last year.   UTI symptoms: dysuria and hematuria, improves after antibiotics Denies history of blood in urine, kidney or bladder stones, pyelonephritis, bladder cancer, and kidney cancer No results found for the last 90 days.   Pelvic Organ Prolapse Symptoms:                  Patient Denies a feeling of a bulge the vaginal area.   Bowel Symptom: Bowel movements: 1 time(s) per day with a history of diverticulosis and IBS-C Stool consistency: soft  Straining: no.  Splinting: no.  Incomplete evacuation: no.  Patient Admits to accidental bowel leakage / fecal incontinence started around 6 months ago when urinary leakage worsened when she changed from insulin  to [place]   Occurs: 1 time(s) per month  Consistency with leakage: soft  Bowel regimen: none Last colonoscopy: Results 6 sessile polyps, internal hemorrhoids [facility] Colonoscopy          Completed or No Longer Recommended     Colonoscopy  Discontinued      Frequency changed to Never automatically (Topic No Longer Applies)   02/11/2022  COLONOSCOPY   Only the first 1 history entries have been loaded, but more history exists.                Sexual Function Sexually active: no.  Sexual orientation: Straight Pain with sex: No  Pelvic Pain Denies pelvic pain  Past Medical History:  Past Medical History:  Diagnosis Date   Adenomatous colon polyp 03/1988   Anemia    borderline   Anxiety    Arthritis    R shoulder - degenerative    Chest pain     Sept 2011: stress test neg   Depression    sees Dr.Cotle   Diabetes mellitus    dr Faythe   Diverticulosis    Fatty liver     Increased LFTs, saw GI 06/2011, likely from fatty liver    Fuchs' corneal dystrophy    GERD (gastroesophageal reflux disease)    History of hiatal hernia    Hyperlipemia    Hypertension    IBS (irritable bowel syndrome)    and dyspepsia   Memory impairment 08/2014   mild cognitive impairment vs. mild dementia, reommended reinstating of cholinesterase inhibitor    Osteopenia    dexa 06/2007 and 10/11 Rx CA vitamin d    Osteopenia    RLS (restless legs syndrome)    h/o- no longer using Requip   Sleep apnea    Mouth guard use at home   Thyroid  nodule      Past Surgical History:   Past Surgical History:  Procedure Laterality Date   BREAST BIOPSY Left 10/11/2015   fibroadenoma w/ calcifications, no evidence of malignancy, recommend mammogram repeat-6 mnths   COLONOSCOPY  04/15/2014   JOINT REPLACEMENT     POLYPECTOMY     REVERSE SHOULDER ARTHROPLASTY Right 04/21/2014   Procedure: REVERSE SHOULDER ARTHROPLASTY;  Surgeon: Eva Herring, MD;  Location: [facility] OR;  Service: Orthopedics;  Laterality: Right;  Right reverse total shoulder replacement   TONSILLECTOMY     UTERINE FIBROID EMBOLIZATION     90s     Past OB/GYN History: OB History  Gravida Para Term Preterm AB Living  3 3 3   3   SAB IAB Ectopic Multiple Live Births      3    # Outcome Date GA Lbr Len/2nd Weight Sex Type Anes PTL Lv  3 Term     M Vag-Spont   LIV  2 Term     F Vag-Spont   LIV  1 Term     F Vag-Spont   LIV    Vaginal deliveries: 3, largest infant 10lb 10oz,  Forceps/ Vacuum deliveries: 0, Cesarean section: 0 Menopausal: Yes, at age 54s, Denies vaginal bleeding since menopause Contraception: s/p menopause. Last pap smear was possibly around 2022.  Any history of abnormal pap smears: no. No results found for: DIAGPAP, HPVHIGH, ADEQPAP  Medications: Patient has a current medication list which includes the following prescription(s): accu-chek fastclix lancets, acetaminophen , aspirin  ec, atenolol ,  atorvastatin , cyanocobalamin , dapagliflozin propanediol, donepezil , gabapentin , glycerin-polysorbate 80, iron -vitamin c , losartan , memantine , metformin , centrum silver  ultra womens, one touch ultra test, pantoprazole , metamucil smooth texture, sertraline , sodium chloride , sodium fluoride, gemtesa , and gemtesa .   Allergies: Patient is allergic to sulfa  antibiotics, trulicity [dulaglutide], and penicillins.   Social History:  Social History   Tobacco Use   Smoking status: Former    Current packs/day: 0.00    Average packs/day: 2.0 packs/day for 10.0 years (20.0 ttl pk-yrs)    Types: Cigarettes    Start date: 11/25/1965    Quit date: 11/26/1975    Years since quitting: 47.6   Smokeless tobacco: Never   Tobacco comments:    used to smoke 2 ppd  Vaping Use   Vaping  status: Never Used  Substance Use Topics   Alcohol use: Yes    Alcohol/week: 0.0 standard drinks of alcohol    Comment: rarely wine   Drug use: No    Relationship status: widowed Patient lives by self in an assisted living facility.   Patient is not employed. Regular exercise: No History of abuse: No  Family History:   Family History  Problem Relation Age of Onset   Lung cancer Mother        smoker   Colon cancer Father        F dx in his 65s   Colon polyps Sister    Colon polyps Sister    Heart attack Maternal Grandmother        34s   Breast cancer Maternal Aunt    Ovarian cancer Paternal Aunt    Diabetes Other        aunts-uncles    Stroke Other        aunts-uncles    Other Other        niece- glioblastoma   Rectal cancer Neg Hx    Stomach cancer Neg Hx    Esophageal cancer Neg Hx    Crohn's disease Neg Hx    Ulcerative colitis Neg Hx    Uterine cancer Neg Hx    Bladder Cancer Neg Hx    Renal cancer Neg Hx      Review of Systems: Review of Systems  Constitutional:  Negative for fever, malaise/fatigue and weight loss.  Respiratory:  Negative for cough, shortness of breath and wheezing.    Cardiovascular:  Negative for chest pain, palpitations and leg swelling.  Gastrointestinal:  Negative for abdominal pain, blood in stool and constipation.  Genitourinary:  Positive for frequency (night time) and urgency. Negative for dysuria and hematuria.       Leakage  Skin:  Negative for rash.  Neurological:  Positive for dizziness. Negative for weakness and headaches.  Endo/Heme/Allergies:  Does not bruise/bleed easily.  Psychiatric/Behavioral:  Negative for depression. The patient is not nervous/anxious.      OBJECTIVE Physical Exam: Vitals:   06/27/23 1253  BP: 131/71  Pulse: (!) 53  Weight: 164 lb (74.4 kg)  Height: 5' 4.57 (1.64 m)    Physical Exam Constitutional:      General: She is not in acute distress.    Appearance: Normal appearance.  Genitourinary:     Bladder and urethral meatus normal.     No lesions in the vagina.     Right Labia: No rash, tenderness, lesions, skin changes or Bartholin's cyst.    Left Labia: No tenderness, lesions, skin changes, Bartholin's cyst or rash.    No vaginal discharge, erythema, tenderness, bleeding, ulceration or granulation tissue.     Moderate vaginal atrophy present.     Right Adnexa: not tender, not full and no mass present.    Left Adnexa: not tender, not full and no mass present.    No cervical motion tenderness, discharge, friability, lesion, polyp or nabothian cyst.     Uterus is not enlarged, fixed, tender, irregular or prolapsed.     No uterine mass detected.    Urethral meatus caruncle not present.    No urethral prolapse, tenderness, mass, hypermobility, discharge or stress urinary incontinence with cough stress test present.     Bladder is not tender, urgency on palpation not present and masses not present.      Pelvic Floor: Levator muscle strength is 2/5.    Levator ani  not tender, obturator internus not tender, no asymmetrical contractions present and no pelvic spasms present.    Symmetrical pelvic sensation,  anal wink present and BC reflex present.  Cardiovascular:     Rate and Rhythm: Normal rate.  Pulmonary:     Effort: Pulmonary effort is normal. No respiratory distress.  Abdominal:     General: There is no distension.     Palpations: Abdomen is soft. There is no mass.     Tenderness: There is no abdominal tenderness.     Hernia: No hernia is present.   Neurological:     Mental Status: She is alert.  Vitals reviewed. Exam conducted with a chaperone present.      POP-Q:   POP-Q  -2                                            Aa   -2                                           Ba  -6                                              C   1                                            Gh  3                                            Pb  7                                            tvl   -3                                            Ap  -3                                            Bp  -6                                              D   Post-Void Residual (PVR) by Bladder Scan: In order to evaluate bladder emptying, we discussed obtaining a postvoid residual and patient agreed to this procedure.  Procedure: The ultrasound unit was placed on the patient's abdomen in the suprapubic region after the patient had voided.    Post Void Residual - 06/27/23  1328       Post Void Residual   Post Void Residual 14 mL           Laboratory Results: Lab Results  Component Value Date   COLORU yellow 06/27/2023   CLARITYU clear 06/27/2023   GLUCOSEUR =500 (A) 06/27/2023   BILIRUBINUR negative 06/27/2023   KETONESU Negative 02/26/2018   SPECGRAV 1.015 06/27/2023   RBCUR negative 06/27/2023   PHUR 6.0 06/27/2023   PROTEINUR negative 11/15/2020   UROBILINOGEN 1.0 06/27/2023   LEUKOCYTESUR Negative 06/27/2023    Lab Results  Component Value Date   CREATININE 1.08 (H) 06/16/2023   CREATININE 0.79 04/10/2023   CREATININE 0.83 03/19/2023    Lab Results  Component Value Date    HGBA1C 7.7 03/20/2023    Lab Results  Component Value Date   HGB 13.7 04/10/2023     ASSESSMENT AND PLAN Ms. Guirguis is a 78 y.o. with:  1. Urinary incontinence, mixed   2. Incontinence of feces, unspecified fecal incontinence type   3. Nocturia   4. 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  While we do not know the exact etiology of OAB, several treatment options exist. We discussed management including behavioral therapy (decreasing bladder irritants, urge suppression strategies, timed voids, bladder retraining), physical therapy, medication; for refractory cases posterior tibial nerve stimulation, sacral neuromodulation, and intravesical botulinum toxin injection.  For anticholinergic medications, we discussed the potential side effects of anticholinergics including dry eyes, dry mouth, constipation, cognitive impairment and urinary retention. For Beta-3 agonist medication, we discussed the potential side effect of elevated blood pressure which is more likely to occur in individuals with uncontrolled hypertension. - encouraged bladder training and timed voids due to dementia - discussed bedside commode due to night time symptoms and balance issues for possible functional incontinence at night - trial of Gemtesa  with samples and Rx provided - discussed importance to maintain glycemic control due to association with urinary symptoms - encouraged Kegel exercises, however difficulty with  consistency due to memory disorder - Cr 1.08 up from 0.79 in 06/16/23. Pending referral to cardiology after recent ED evaluation for near syncope. Consider repeat Cr to trend and if clinical change.  Orders: -     POCT URINALYSIS DIP (CLINITEK) -     Gemtesa ; Take 1 tablet (75 mg total) by mouth daily.  Dispense: 28 tablet; Refill: 0 -     Gemtesa ; Take 1 tablet (75 mg total) by mouth daily.  Dispense: 30 tablet; Refill: 2  Incontinence of feces, unspecified fecal incontinence type Assessment & Plan: - started around the time of Farxiga, reports metformin  use for years. Encouraged to assess need to modify medication regimen for DM control with PCP - reviewed need to monitor stool consistency - trial of fiber supplementation vs. Dietary fiber to reassess symptoms - encouraged kegel exercises and squatting position for bowel movements - Treatment options include anti-diarrhea medication (loperamide/ Imodium OTC or prescription lomotil), fiber supplements, physical therapy, and possible sacral neuromodulation or surgery.   - consider Trospium due to anti-cholinergic property if loose  stool noted due to aricept  use - encouraged diary to monitor symptoms and triggers   Orders: -     Metamucil Smooth Texture; Take 1 packet by mouth daily. Reduce to 1/2 dose if you experience any abdominal bloating or worsening bowel leakage  Dispense: 283 g; Refill: 12  Nocturia Assessment & Plan: - continue to avoid fluid intake 3 hours before bedtime - elevated feet during the day or use compression socks to reduce lower extremity swelling - uses mouth piece for OSA, consider reassessment and possible CPAP for sleep apnea if refractory night time symptoms - trial of Gemtesa     Moderate dementia without behavioral disturbance, psychotic disturbance, mood disturbance, or anxiety, unspecified dementia type (HCC) Assessment & Plan: - history taking with assistance of daughter - encouraged diary to assess  triggers and associations with fluctuations in symptoms   Time spent: I spent 64 minutes dedicated to the care of this patient on the date of this encounter to include pre-visit review of records, face-to-face time with the patient discussing mixed urinary incontinence, fecal incontinence, nocturia, dementia, and post visit documentation and ordering medication/ testing.   Lianne ONEIDA Gillis, MD

## 2023-06-27 ENCOUNTER — Encounter: Payer: Self-pay | Admitting: Obstetrics

## 2023-06-27 ENCOUNTER — Ambulatory Visit: Admitting: Obstetrics

## 2023-06-27 VITALS — BP 131/71 | HR 53 | Ht 64.57 in | Wt 164.0 lb

## 2023-06-27 DIAGNOSIS — R351 Nocturia: Secondary | ICD-10-CM | POA: Insufficient documentation

## 2023-06-27 DIAGNOSIS — R159 Full incontinence of feces: Secondary | ICD-10-CM | POA: Diagnosis not present

## 2023-06-27 DIAGNOSIS — N3946 Mixed incontinence: Secondary | ICD-10-CM | POA: Insufficient documentation

## 2023-06-27 DIAGNOSIS — F03B0 Unspecified dementia, moderate, without behavioral disturbance, psychotic disturbance, mood disturbance, and anxiety: Secondary | ICD-10-CM | POA: Diagnosis not present

## 2023-06-27 LAB — POCT URINALYSIS DIP (CLINITEK)
Bilirubin, UA: NEGATIVE
Blood, UA: NEGATIVE
Glucose, UA: 500 mg/dL — AB
Ketones, POC UA: NEGATIVE mg/dL
Leukocytes, UA: NEGATIVE
Nitrite, UA: NEGATIVE
POC PROTEIN,UA: NEGATIVE
Spec Grav, UA: 1.015 (ref 1.010–1.025)
Urobilinogen, UA: 1 U/dL
pH, UA: 6 (ref 5.0–8.0)

## 2023-06-27 MED ORDER — GEMTESA 75 MG PO TABS: 75.0000 mg | ORAL_TABLET | Freq: Every day | ORAL | 0 refills | Status: AC

## 2023-06-27 MED ORDER — METAMUCIL SMOOTH TEXTURE 58.6 % PO POWD: 1.0000 | Freq: Every day | ORAL | 12 refills | Status: AC

## 2023-06-27 MED ORDER — GEMTESA 75 MG PO TABS: 75.0000 mg | ORAL_TABLET | Freq: Every day | ORAL | 2 refills | Status: DC

## 2023-06-27 NOTE — Assessment & Plan Note (Signed)
-   history taking with assistance of daughter - encouraged diary to assess triggers and associations with fluctuations in symptoms

## 2023-06-27 NOTE — Assessment & Plan Note (Signed)
-   continue to avoid fluid intake 3 hours before bedtime - elevated feet during the day or use compression socks to reduce lower extremity swelling - uses mouth piece for OSA, consider reassessment and possible CPAP for sleep apnea if refractory night time symptoms - trial of Gemtesa

## 2023-06-27 NOTE — Patient Instructions (Addendum)
 We discussed the symptoms of overactive bladder (OAB), which include urinary urgency, urinary frequency, night-time urination, with or without urge incontinence.  We discussed management including behavioral therapy (decreasing bladder irritants by following a bladder diet, urge suppression strategies, timed voids, bladder retraining), physical therapy, medication; and for refractory cases posterior tibial nerve stimulation, sacral neuromodulation, and intravesical botulinum toxin injection.   For Beta-3 agonist medication, we discussed the potential side effect of elevated blood pressure which is more likely to occur in individuals with uncontrolled hypertension. You were given samples for Gemtesa 75 mg.  It can take a month to start working so give it time, but if you have bothersome side effects call sooner and we can try a different medication.  Call us  if you have trouble filling the prescription or if it's not covered by your insurance.  Consider bedside commode for night time urinary frequency and leakage.  For treatment of stress urinary incontinence, which is leakage with physical activity/movement/strainging/coughing, we discussed expectant management versus nonsurgical options versus surgery. Nonsurgical options include weight loss, physical therapy, as well as a pessary.  Surgical options include a midurethral sling, which is a synthetic mesh sling that acts like a hammock under the urethra to prevent leakage of urine, a Burch urethropexy, and transurethral injection of a bulking agent.   For night time frequency: - avoid fluid intake 3 hours before bedtime - elevated your feet during the day or use compression socks to reduce lower extremity swelling - consider CPAP for sleep apnea  Accidental Bowel Leakage:  - Treatment options include anti-diarrhea medication (loperamide/ Imodium OTC or prescription lomotil), fiber supplements, physical therapy, and possible sacral neuromodulation or  surgery.     Women should try to eat at least 21 to 25 grams of fiber a day, while men should aim for 30 to 38 grams a day. You can add fiber to your diet with food or a fiber supplement such as psyllium (metamucil), benefiber, or fibercon.   Here's a look at how much dietary fiber is found in some common foods. When buying packaged foods, check the Nutrition Facts label for fiber content. It can vary among brands.  Fruits Serving size Total fiber (grams)*  Raspberries 1 cup 8.0  Pear 1 medium 5.5  Apple, with skin 1 medium 4.5  Banana 1 medium 3.0  Orange 1 medium 3.0  Strawberries 1 cup 3.0   Vegetables Serving size Total fiber (grams)*  Green peas, boiled 1 cup 9.0  Broccoli, boiled 1 cup chopped 5.0  Turnip greens, boiled 1 cup 5.0  Brussels sprouts, boiled 1 cup 4.0  Potato, with skin, baked 1 medium 4.0  Sweet corn, boiled 1 cup 3.5  Cauliflower, raw 1 cup chopped 2.0  Carrot, raw 1 medium 1.5   Grains Serving size Total fiber (grams)*  Spaghetti, whole-wheat, cooked 1 cup 6.0  Barley, pearled, cooked 1 cup 6.0  Bran flakes 3/4 cup 5.5  Quinoa, cooked 1 cup 5.0  Oat bran muffin 1 medium 5.0  Oatmeal, instant, cooked 1 cup 5.0  Popcorn, air-popped 3 cups 3.5  Brown rice, cooked 1 cup 3.5  Bread, whole-wheat 1 slice 2.0  Bread, rye 1 slice 2.0   Legumes, nuts and seeds Serving size Total fiber (grams)*  Split peas, boiled 1 cup 16.0  Lentils, boiled 1 cup 15.5  Black beans, boiled 1 cup 15.0  Baked beans, canned 1 cup 10.0  Chia seeds 1 ounce 10.0  Almonds 1 ounce (23 nuts) 3.5  Pistachios  1 ounce (49 nuts) 3.0  Sunflower kernels 1 ounce 3.0  *Rounded to nearest 0.5 gram. Source: [employer] for [employer]	[place]

## 2023-06-27 NOTE — Assessment & Plan Note (Addendum)
-   started around the time of Farxiga, reports metformin  use for years. Encouraged to assess need to modify medication regimen for DM control with PCP - reviewed need to monitor stool consistency - trial of fiber supplementation vs. Dietary fiber to reassess symptoms - encouraged kegel exercises and squatting position for bowel movements - Treatment options include anti-diarrhea medication (loperamide/ Imodium OTC or prescription lomotil), fiber supplements, physical therapy, and possible sacral neuromodulation or surgery.   - consider Trospium due to anti-cholinergic property if loose stool noted due to aricept  use - encouraged diary to monitor symptoms and triggers

## 2023-06-27 NOTE — Assessment & Plan Note (Addendum)
-   urgency > stress - POCT UA with + glucose only, on Farxiga and metformin  - h/o SIADH managed by Dr. Kathyanne Parkers without medication use - prior use of mirabegron, unclear reason for discontinuation or efficacy - We discussed the symptoms of overactive bladder (OAB), which include urinary urgency, urinary frequency, nocturia, with or without urge incontinence.  While we do not know the exact etiology of OAB, several treatment options exist. We discussed management including behavioral therapy (decreasing bladder irritants, urge suppression strategies, timed voids, bladder retraining), physical therapy, medication; for refractory cases posterior tibial nerve stimulation, sacral neuromodulation, and intravesical botulinum toxin injection.  For anticholinergic medications, we discussed the potential side effects of anticholinergics including dry eyes, dry mouth, constipation, cognitive impairment and urinary retention. For Beta-3 agonist medication, we discussed the potential side effect of elevated blood pressure which is more likely to occur in individuals with uncontrolled hypertension. - encouraged bladder training and timed voids due to dementia - discussed bedside commode due to night time symptoms and balance issues for possible functional incontinence at night - trial of Gemtesa with samples and Rx provided - discussed importance to maintain glycemic control due to association with urinary symptoms - encouraged Kegel exercises, however difficulty with consistency due to memory disorder - Cr 1.08 up from 0.79 in 06/16/23. Pending referral to cardiology after recent ED evaluation for near syncope. Consider repeat Cr to trend and if clinical change.

## 2023-07-14 ENCOUNTER — Ambulatory Visit: Attending: Cardiovascular Disease | Admitting: Cardiovascular Disease

## 2023-07-14 ENCOUNTER — Encounter (HOSPITAL_COMMUNITY): Payer: Self-pay | Admitting: Internal Medicine

## 2023-07-14 ENCOUNTER — Encounter: Payer: Self-pay | Admitting: Cardiovascular Disease

## 2023-07-14 VITALS — BP 108/70 | HR 58 | Ht 64.57 in | Wt 167.6 lb

## 2023-07-14 DIAGNOSIS — I493 Ventricular premature depolarization: Secondary | ICD-10-CM

## 2023-07-14 DIAGNOSIS — I4891 Unspecified atrial fibrillation: Secondary | ICD-10-CM | POA: Diagnosis not present

## 2023-07-14 NOTE — Progress Notes (Signed)
 Electrophysiology Office Note:    Date:  07/14/2023   ID:  Chalonda, Schlatter [birth year], MRN [id number]  PCP:  Amon Aloysius BRAVO, MD   [facility] Providers Cardiologist:  None     Referring MD: Amon Aloysius BRAVO, MD   History of Present Illness:    [name] is a 78 y.o. female with a medical history significant for atrial fibrillation, frequent PVCs hypertension, SIADH, IBS, referred for management of atrial fibrillation and PVCs.      Discussed the use of AI scribe software for clinical note transcription with the patient, who gave verbal consent to proceed.  History of Present Illness The patient, with atrial fibrillation and premature ventricular contractions, presents with multiple arrhythmia issues. She is accompanied by her daughter. She was referred by [facility] for multiple arrhythmia issues including PVCs and atrial fibrillation.  A week after the ER visit, she experienced a sudden episode of heart racing while dining out, with her heart rate reaching 179-180 bpm as measured by an Apple watch. She felt faint and lightheaded during this episode. A fellow diner with atrial fibrillation used a portable EKG to confirm AFib twice before she returned to normal sinus rhythm.  Her daughter reports that during a recent visit to her primary care physician, some abnormal rhythms were noted, but it was not atrial fibrillation. The daughter is concerned about the possibility of undetected episodes due to her memory issues.  She has not been on any blood thinners and has not worn a heart monitor previously.         Today, she has no complaints  EKGs/Labs/Other Studies Reviewed Today:      EKG:   EKG Interpretation Date/Time:  Monday July 14 2023 11:35:10 EDT Ventricular Rate:  58 PR Interval:  174 QRS Duration:  88 QT Interval:  454 QTC Calculation: 445 R Axis:   -58  Text Interpretation: Sinus bradycardia with marked sinus arrhythmia Left anterior fascicular block  When compared with ECG of 10-Apr-2023 16:22, No significant change was found Confirmed by Nancey Scotts [phone]) on 07/14/2023 11:53:46 AM     Physical Exam:    VS:  BP 108/70 (BP Location: Right Arm, Patient Position: Sitting, Cuff Size: Normal)   Pulse (!) 58   Ht 5' 4.57 (1.64 m)   Wt 167 lb 9.6 oz (76 kg)   SpO2 97%   BMI 28.26 kg/m     Wt Readings from Last 3 Encounters:  07/14/23 167 lb 9.6 oz (76 kg)  06/27/23 164 lb (74.4 kg)  06/16/23 169 lb 2 oz (76.7 kg)     GEN: Well nourished, well developed in no acute distress CARDIAC: RRR, no murmurs, rubs, gallops RESPIRATORY:  Normal work of breathing MUSCULOSKELETAL: no edema    ASSESSMENT & PLAN:     Atrial fibrillation -- reported This has not been documented on any EKGs available to us  I think loop recorder is warranted.  Episodes are apparently too rare to reliably detect with a wearable monitor I explained the loop recorder placement procedure, risk of infection, bleeding, discomfort.  I explained the monitoring process with the associated fee and she is willing to proceed. Upon documentation of abnormal rhythm, will obtain echocardiogram  PVCs Likely cause of occasional palpitations Will monitor with loop recorder  Secondary hypercoagulable state Awaiting formal documentation of atrial fibrillation prior to starting anticoagulation  I explained the indication for the loop placement.  I explained risks of infection, minor bleeding, discomfort.  I  explained the monitoring process with the associated feet.  She would like to proceed.  Signed, Eulas FORBES Furbish, MD  07/14/2023 5:45 PM    [facility]

## 2023-07-14 NOTE — Patient Instructions (Signed)
 Medication Instructions:  Your physician recommends that you continue on your current medications as directed. Please refer to the Current Medication list given to you today.  *If you need a refill on your cardiac medications before your next appointment, please call your pharmacy*  Lab Work: None ordered.  If you have labs (blood work) drawn today and your tests are completely normal, you will receive your results only by: MyChart Message (if you have MyChart) OR A paper copy in the mail If you have any lab test that is abnormal or we need to change your treatment, we will call you to review the results.  Testing/Procedures: Dr Nancey would like to implant an Internal Loop Recorder - we will contact your insurance company and once we receive approval we contact you to schedule an appointment.  Follow-Up: At [facility], you and your health needs are our priority.  As part of our continuing mission to provide you with exceptional heart care, our providers are all part of one team.  This team includes your primary Cardiologist (physician) and Advanced Practice Providers or APPs (Physician Assistants and Nurse Practitioners) who all work together to provide you with the care you need, when you need it.  Your next appointment:   To be scheduled

## 2023-07-22 ENCOUNTER — Telehealth: Payer: Self-pay

## 2023-07-22 ENCOUNTER — Other Ambulatory Visit (HOSPITAL_COMMUNITY): Payer: Self-pay | Admitting: Internal Medicine

## 2023-07-22 DIAGNOSIS — E042 Nontoxic multinodular goiter: Secondary | ICD-10-CM

## 2023-07-22 DIAGNOSIS — E0590 Thyrotoxicosis, unspecified without thyrotoxic crisis or storm: Secondary | ICD-10-CM

## 2023-07-22 NOTE — Telephone Encounter (Signed)
 Physician orders received from [facility]. Medication discrepancies- metformin  500mg  vs metformin  xr 500mg , instructed to contact Pt's endocrinologist Dr. Faythe for clarification. They also did not have Pt's sodium chloride  (Muro 128) 5% ophthalmic solution and Refresh tears on her med list. I have also added this. Forms placed in PCP red folder to be signed.

## 2023-07-23 NOTE — Telephone Encounter (Signed)
 Form faxed back to [facility] at [phone]. Form sent for scanning.

## 2023-07-23 NOTE — Telephone Encounter (Signed)
 signed

## 2023-07-28 ENCOUNTER — Telehealth: Payer: Self-pay

## 2023-07-28 MED ORDER — DONEPEZIL HCL 10 MG PO TABS: 20.0000 mg | ORAL_TABLET | Freq: Every day | ORAL | 1 refills | Status: AC

## 2023-07-28 NOTE — Telephone Encounter (Signed)
Order faxed to number as requested.

## 2023-07-28 NOTE — Telephone Encounter (Signed)
 Copied from CRM [id number]. Topic: Clinical - Prescription Issue >> Jul 28, 2023  4:21 PM Chiquita SQUIBB wrote: Reason for CRM: [facility] is calling in with questions regarding the donepezil  (ARICEPT ) 10 MG tablet [[id number]] medication. They do not have the most recent for it on file and are asking if the order for it can be faxed over to [phone]. They received an order for 10mg  once a day.

## 2023-07-29 ENCOUNTER — Ambulatory Visit (INDEPENDENT_AMBULATORY_CARE_PROVIDER_SITE_OTHER)

## 2023-07-29 VITALS — Ht 64.5 in | Wt 167.0 lb

## 2023-07-29 DIAGNOSIS — Z0000 Encounter for general adult medical examination without abnormal findings: Secondary | ICD-10-CM

## 2023-07-29 NOTE — Progress Notes (Signed)
 Subjective:   Glorimar Stroope Strandberg is a 78 y.o. who presents for a Medicare Wellness preventive visit.  As a reminder, Annual Wellness Visits don't include a physical exam, and some assessments may be limited, especially if this visit is performed virtually. We may recommend an in-person follow-up visit with your provider if needed.  Visit Complete: Virtual I connected with  Aniylah Avans Igo on 07/29/23 by a audio enabled telemedicine application and verified that I am speaking with the correct person using two identifiers.  Patient Location: Home  Provider Location: Home Office  I discussed the limitations of evaluation and management by telemedicine. The patient expressed understanding and agreed to proceed.  Vital Signs: Because this visit was a virtual/telehealth visit, some criteria may be missing or patient reported. Any vitals not documented were not able to be obtained and vitals that have been documented are patient reported.    Persons Participating in Visit: Patient assisted by Daughter.  AWV Questionnaire:   Cardiac Risk Factors include: advanced age (>82men, >76 women);diabetes mellitus;hypertension     Objective:    Today's Vitals   07/29/23 1526  Weight: 167 lb (75.8 kg)  Height: 5' 4.5 (1.638 m)   Body mass index is 28.22 kg/m.     07/29/2023    3:41 PM 04/10/2023    4:22 PM 04/16/2022   10:54 AM 04/12/2021   12:47 PM 03/30/2019   11:22 AM 11/05/2018    2:46 PM 03/09/2018   11:10 AM  Advanced Directives  Does Patient Have a Medical Advance Directive? Yes Yes Yes Yes Yes Yes Yes   Type of Estate agent of [place];Living will  Healthcare Power of [place];Living will Healthcare Power of [place];Living will Healthcare Power of [place];Living will Healthcare Power of [place];Living will Healthcare Power of [place];Living will  Does patient want to make changes to medical advance directive?  Yes (ED - Information included in AVS) No - Patient  declined   No - Patient declined   Copy of Healthcare Power of Attorney in Chart? No - copy requested  Yes - validated most recent copy scanned in chart (See row information) Yes - validated most recent copy scanned in chart (See row information)  Yes - validated most recent copy scanned in chart (See row information) No - copy requested      Data saved with a previous flowsheet row definition    Current Medications (verified) Outpatient Encounter Medications as of 07/29/2023  Medication Sig   ACCU-CHEK FASTCLIX LANCETS MISC daily. use as directed   acetaminophen  (TYLENOL ) 325 MG tablet Take 650 mg by mouth every 6 (six) hours as needed for mild pain or headache.   aspirin  EC 81 MG tablet Take 1 tablet (81 mg total) by mouth daily. Swallow whole.   atenolol  (TENORMIN ) 25 MG tablet Take 25 mg by mouth daily.   atorvastatin  (LIPITOR) 40 MG tablet Take 1 tablet (40 mg total) by mouth daily.   cyanocobalamin  (VITAMIN B12) 1000 MCG tablet Take 1 tablet (1,000 mcg total) by mouth daily.   dapagliflozin propanediol (FARXIGA) 5 MG TABS tablet Take 5 mg by mouth daily.   donepezil  (ARICEPT ) 10 MG tablet Take 2 tablets (20 mg total) by mouth at bedtime.   gabapentin  (NEURONTIN ) 100 MG capsule Take 1 capsule (100 mg total) by mouth at bedtime.   Glycerin-Polysorbate 80 (REFRESH DRY EYE THERAPY OP) Refresh Dry Eye Therapy   Iron -Vitamin C  65-125 MG TABS Take 1 tablet by mouth daily.   losartan  (COZAAR )  25 MG tablet Take 1 tablet (25 mg total) by mouth daily.   memantine  (NAMENDA ) 10 MG tablet Take 1 tablet (10 mg total) by mouth 2 (two) times daily.   metFORMIN  (GLUCOPHAGE -XR) 500 MG 24 hr tablet TAKE 1 TABLET BY MOUTH EVERY MORNING WITH A MEAL   Multiple Vitamins-Minerals (CENTRUM SILVER  ULTRA WOMENS) TABS Take 1 tablet by mouth daily.   ONE TOUCH ULTRA TEST test strip USE TO CHECK BLOOD SUGAR EVERY DAY   pantoprazole  (PROTONIX ) 40 MG tablet Take 1 tablet (40 mg total) by mouth in the morning.    psyllium (METAMUCIL SMOOTH TEXTURE) 58.6 % powder Take 1 packet by mouth daily. Reduce to 1/2 dose if you experience any abdominal bloating or worsening bowel leakage   sertraline  (ZOLOFT ) 100 MG tablet Take 1 tablet (100 mg total) by mouth daily.   sodium chloride  (MURO 128) 5 % ophthalmic solution Place 1 drop into both eyes every 4 (four) hours as needed.   sodium fluoride (FLUORISHIELD) 1.1 % GEL dental gel Place 1 application onto teeth at bedtime as needed (for teeth).    Vibegron  (GEMTESA ) 75 MG TABS Take 1 tablet (75 mg total) by mouth daily.   No facility-administered encounter medications on file as of 07/29/2023.    Allergies (verified) Sulfa  antibiotics, Trulicity [dulaglutide], and Penicillins   History: Past Medical History:  Diagnosis Date   Adenomatous colon polyp 03/1988   Anemia    borderline   Anxiety    Arthritis    R shoulder - degenerative    Chest pain     Sept 2011: stress test neg   Depression    sees Dr.Cotle   Diabetes mellitus    dr Faythe   Diverticulosis    Fatty liver    Increased LFTs, saw GI 06/2011, likely from fatty liver    Fuchs' corneal dystrophy    GERD (gastroesophageal reflux disease)    History of hiatal hernia    Hyperlipemia    Hypertension    IBS (irritable bowel syndrome)    and dyspepsia   Memory impairment 08/2014   mild cognitive impairment vs. mild dementia, reommended reinstating of cholinesterase inhibitor    Osteopenia    dexa 06/2007 and 10/11 Rx CA vitamin d    Osteopenia    RLS (restless legs syndrome)    h/o- no longer using Requip   Sleep apnea    Mouth guard use at home   Thyroid  nodule    Past Surgical History:  Procedure Laterality Date   BREAST BIOPSY Left 10/11/2015   fibroadenoma w/ calcifications, no evidence of malignancy, recommend mammogram repeat-6 mnths   COLONOSCOPY  04/15/2014   JOINT REPLACEMENT     POLYPECTOMY     REVERSE SHOULDER ARTHROPLASTY Right 04/21/2014   Procedure: REVERSE SHOULDER  ARTHROPLASTY;  Surgeon: Eva Herring, MD;  Location: [facility] OR;  Service: Orthopedics;  Laterality: Right;  Right reverse total shoulder replacement   TONSILLECTOMY     UTERINE FIBROID EMBOLIZATION     90s   Family History  Problem Relation Age of Onset   Lung cancer Mother        smoker   Colon cancer Father        F dx in his 6s   Colon polyps Sister    Colon polyps Sister    Heart attack Maternal Grandmother        50s   Breast cancer Maternal Aunt    Ovarian cancer Paternal Aunt    Diabetes Other  aunts-uncles    Stroke Other        aunts-uncles    Other Other        niece- glioblastoma   Rectal cancer Neg Hx    Stomach cancer Neg Hx    Esophageal cancer Neg Hx    Crohn's disease Neg Hx    Ulcerative colitis Neg Hx    Uterine cancer Neg Hx    Bladder Cancer Neg Hx    Renal cancer Neg Hx    Social History   Socioeconomic History   Marital status: Widowed    Spouse name: [place]   Number of children: 3   Years of education: Not on file   Highest education level: Bachelor's degree (e.g., BA, AB, BS)  Occupational History   Occupation: retired, was a Education officer, 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  Tobacco Use   Smoking status: Former    Current packs/day: 0.00    Average packs/day: 2.0 packs/day for 10.0 years (20.0 ttl pk-yrs)    Types: Cigarettes    Start date: 11/25/1965    Quit date: 11/26/1975    Years since quitting: 47.7   Smokeless tobacco: Never   Tobacco comments:    used to smoke 2 ppd  Vaping Use   Vaping status: Never Used  Substance and Sexual Activity   Alcohol use: Yes    Alcohol/week: 0.0 standard drinks of alcohol    Comment: rarely wine   Drug use: No   Sexual activity: Not Currently  Other Topics Concern   Not on file  Social History Narrative   Widow , lives by herself at [facility] independent living, 1 bedroom apt;  still drives (limited) .   Daughters Engineer, site) live  in [place] in [place]     5 g-children   Social  Drivers of Health   Financial Resource Strain: Low Risk  (07/29/2023)   Overall Financial Resource Strain (CARDIA)    Difficulty of Paying Living Expenses: Not hard at all  Food Insecurity: No Food Insecurity (07/29/2023)   Hunger Vital Sign    Worried About Running Out of Food in the Last Year: Never true    Ran Out of Food in the Last Year: Never true  Transportation Needs: No Transportation Needs (07/29/2023)   PRAPARE - Administrator, Civil Service (Medical): No    Lack of Transportation (Non-Medical): No  Physical Activity: Inactive (07/29/2023)   Exercise Vital Sign    Days of Exercise per Week: 0 days    Minutes of Exercise per Session: 0 min  Stress: No Stress Concern Present (07/29/2023)   [employer] of Occupational Health - Occupational Stress Questionnaire    Feeling of Stress: Not at all  Social Connections: Moderately Integrated (07/29/2023)   Social Connection and Isolation Panel    Frequency of Communication with Friends and Family: More than three times a week    Frequency of Social Gatherings with Friends and Family: More than three times a week    Attends Religious Services: More than 4 times per year    Active Member of [employer] or Organizations: Yes    Attends Banker Meetings: More than 4 times per year    Marital Status: Widowed    Tobacco Counseling Counseling given: Not Answered Tobacco comments: used to smoke 2 ppd    Clinical Intake:  Pre-visit preparation completed: Yes  Pain : No/denies pain     BMI - recorded: 28.22 Nutritional Status: BMI  25 -29 Overweight Nutritional Risks: None Diabetes: Yes CBG done?: No Did pt. bring in CBG monitor from home?: No  Lab Results  Component Value Date   HGBA1C 7.7 03/20/2023   HGBA1C 8.4 08/22/2022   HGBA1C 7.3 01/24/2022     How often do you need to have someone help you when you read instructions, pamphlets, or other written materials from your doctor or pharmacy?: 4 -  Often (Daughters assist)  Interpreter Needed?: No  Information entered by :: Rojelio Blush LPN   Activities of Daily Living     07/29/2023    3:34 PM  In your present state of health, do you have any difficulty performing the following activities:  Hearing? 0  Vision? 0  Difficulty concentrating or making decisions? 1  Comment Dx :Dementia  Walking or climbing stairs? 1  Comment Uses a Cane  Dressing or bathing? 1  Comment Aide assist  Doing errands, shopping? 1  Comment [employer] broker and eating ? Y  Comment Facility prepares food  Using the Toilet? Y  Comment Aide assist  In the past six months, have you accidently leaked urine? Y  Comment Wears a Breif. Incontient of Bladder  Do you have problems with loss of bowel control? N  Managing your Medications? Y  Comment Aide assist  Managing your Finances? Y  Comment Daughters Product/process development scientist or managing your Housekeeping? Y  Comment Aide assist    Patient Care Team: Amon Aloysius BRAVO, MD as PCP - General Verta, Max T, DPM as Consulting Physician (Podiatry) Tonuzi, Lirim, MD as Referring Physician (Neurology) Faythe Purchase, MD as Consulting Physician (Endocrinology) Tasia Lung, MD as Consulting Physician (Psychiatry) Kandyce Sor, MD as Consulting Physician (Obstetrics and Gynecology) Selma Donnice SAUNDERS, MD as Consulting Physician (Urology) [facility], [place], [place] Carla Milling, RPH-CPP (Pharmacist)  I have updated your Care Teams any recent Medical Services you may have received from other providers in the past year.     Assessment:   This is a routine wellness examination for [place].  Hearing/Vision screen Hearing Screening - Comments:: Denies hearing difficulties   Vision Screening - Comments:: Wears rx glasses - up to date with routine eye exams with  Dr Glendia   Goals Addressed               This Visit's Progress     Remain active (pt-stated)         Depression Screen      07/29/2023    3:33 PM 03/19/2023    2:56 PM 09/25/2022    2:51 PM 07/03/2022    2:23 PM 04/16/2022   11:08 AM 11/07/2021    2:55 PM 07/18/2021    3:16 PM  PHQ 2/9 Scores  PHQ - 2 Score 0 0 0 0 0 0 2  PHQ- 9 Score  0  0  3 7    Fall Risk     07/29/2023    3:38 PM 03/19/2023    2:55 PM 09/25/2022    2:52 PM 07/03/2022    2:23 PM 04/16/2022   11:03 AM  Fall Risk   Falls in the past year? 1 0 0 1 1  Number falls in past yr: 0 0 0 0 0  Injury with Fall? 1 0 0 0 0  Comment Bruised [place]. Followed by medical attention      Risk for fall due to : Impaired balance/gait;History of fall(s)    No Fall Risks  Follow up  Falls evaluation completed Falls evaluation completed;Education provided Falls evaluation completed;Education provided Falls evaluation completed Falls evaluation completed    MEDICARE RISK AT HOME:  Medicare Risk at Home Any stairs in or around the home?: No If so, are there any without handrails?: No Home free of loose throw rugs in walkways, pet beds, electrical cords, etc?: Yes Adequate lighting in your home to reduce risk of falls?: Yes Life alert?: Yes Use of a cane, walker or w/c?: Yes Grab bars in the bathroom?: Yes Shower chair or bench in shower?: Yes Elevated toilet seat or a handicapped toilet?: Yes  TIMED UP AND GO:  Was the test performed?  No  Cognitive Function: Impaired: Patient has current diagnosis of cognitive impairment.    01/13/2017    9:39 AM 01/12/2016   10:02 AM  MMSE - Mini Mental State Exam  Orientation to time 5  5   Orientation to Place 5  5   Registration 3  3   Attention/ Calculation 5  5   Recall 2  3   Language- name 2 objects 2  2   Language- repeat 1 1  Language- follow 3 step command 3  3   Language- read & follow direction 1  1   Write a sentence 1  1   Copy design 1  1   Total score 29  30      Data saved with a previous flowsheet row definition        07/29/2023    3:42 PM 04/16/2022   11:14 AM  6CIT Screen  What  Year? -- 0 points  What month? -- 0 points  What time? -- 0 points  Count back from 20 -- 2 points  Months in reverse -- 2 points  Repeat phrase -- 10 points  Total Score  14 points    Immunizations Immunization History  Administered Date(s) Administered   Fluad Quad(high Dose 65+) 09/30/2018   Influenza Split 11/26/2010, 11/28/2011, 10/26/2013   Influenza, High Dose Seasonal PF 09/21/2014, 09/18/2015, 10/09/2016, 09/27/2017, 11/06/2022   Influenza,inj,Quad PF,6+ Mos 10/27/2012   Influenza-Unspecified 01/06/2020, 09/02/2020, 10/07/2021   Moderna Sars-Covid-2 Vaccination 01/11/2019, 02/08/2019, 06/06/2020   PFIZER(Purple Top)SARS-COV-2 Vaccination 01/07/2020   PNEUMOCOCCAL CONJUGATE-20 07/18/2021   Pfizer(Comirnaty)Fall Seasonal Vaccine 12 years and older 03/24/2023   Pneumococcal Conjugate-13 12/24/2013   Pneumococcal Polysaccharide-23 06/09/2007, 11/30/2012   RSV,unspecified 03/24/2023   Tdap 09/26/2021, 01/11/2022   Tetanus 11/30/2012   Zoster Recombinant(Shingrix) 11/07/2017, 01/27/2018   Zoster, Live 03/24/2008    Screening Tests Health Maintenance  Topic Date Due   INFLUENZA VACCINE  08/08/2023   HEMOGLOBIN A1C  09/20/2023   COVID-19 Vaccine (6 - Mixed Product risk 2024-25 season) 09/24/2023   OPHTHALMOLOGY EXAM  01/20/2024   FOOT EXAM  03/19/2024   Diabetic kidney evaluation - eGFR measurement  06/15/2024   Diabetic kidney evaluation - Urine ACR  06/17/2024   Medicare Annual Wellness (AWV)  07/28/2024   DTaP/Tdap/Td (3 - Td or Tdap) 01/12/2032   Pneumococcal Vaccine: 50+ Years  Completed   DEXA SCAN  Completed   Hepatitis C Screening  Completed   Zoster Vaccines- Shingrix  Completed   Hepatitis B Vaccines  Aged Out   HPV VACCINES  Aged Out   Meningococcal B Vaccine  Aged Out   MAMMOGRAM  Discontinued   Colonoscopy  Discontinued    Health Maintenance  There are no preventive care reminders to display for this patient. Health Maintenance Items  Addressed:   Additional Screening:  Vision Screening: Recommended annual ophthalmology exams for early detection of glaucoma and other disorders of the eye. Would you like a referral to an eye doctor? No    Dental Screening: Recommended annual dental exams for proper oral hygiene  Community Resource Referral / Chronic Care Management: CRR required this visit?  No   CCM required this visit?  No   Plan:    I have personally reviewed and noted the following in the patient's chart:   Medical and social history Use of alcohol, tobacco or illicit drugs  Current medications and supplements including opioid prescriptions. Patient is not currently taking opioid prescriptions. Functional ability and status Nutritional status Physical activity Advanced directives List of other physicians Hospitalizations, surgeries, and ER visits in previous 12 months Vitals Screenings to include cognitive, depression, and falls Referrals and appointments  In addition, I have reviewed and discussed with patient certain preventive protocols, quality metrics, and best practice recommendations. A written personalized care plan for preventive services as well as general preventive health recommendations were provided to patient.   Rojelio LELON Blush, LPN   2/77/7974   After Visit Summary: (MyChart) Due to this being a telephonic visit, the after visit summary with patients personalized plan was offered to patient via MyChart   Notes: Nothing significant to report at this time.

## 2023-07-29 NOTE — Patient Instructions (Addendum)
 Ms. Doell , Thank you for taking time out of your busy schedule to complete your Annual Wellness Visit with me. I enjoyed our conversation and look forward to speaking with you again next year. I, as well as your care team,  appreciate your ongoing commitment to your health goals. Please review the following plan we discussed and let me know if I can assist you in the future. Your Game plan/ To Do List    Referrals: If you haven't heard from the office you've been referred to, please reach out to them at the phone provided.   Follow up Visits: Next Medicare AWV with our clinical staff: 08/03/24 @ 3:10p   Have you seen your provider in the last 6 months (3 months if uncontrolled diabetes)?  Next Office Visit with your provider: 09/19/23 @ 2:40p  Clinician Recommendations:  Aim for 30 minutes of exercise or brisk walking, 6-8 glasses of water , and 5 servings of fruits and vegetables each day.       This is a list of the screening recommended for you and due dates:  Health Maintenance  Topic Date Due   Flu Shot  08/08/2023   Hemoglobin A1C  09/20/2023   COVID-19 Vaccine (6 - Mixed Product risk 2024-25 season) 09/24/2023   Eye exam for diabetics  01/20/2024   Complete foot exam   03/19/2024   Yearly kidney function blood test for diabetes  06/15/2024   Yearly kidney health urinalysis for diabetes  06/17/2024   Medicare Annual Wellness Visit  07/28/2024   DTaP/Tdap/Td vaccine (3 - Td or Tdap) 01/12/2032   Pneumococcal Vaccine for age over 51  Completed   DEXA scan (bone density measurement)  Completed   Hepatitis C Screening  Completed   Zoster (Shingles) Vaccine  Completed   Hepatitis B Vaccine  Aged Out   HPV Vaccine  Aged Out   Meningitis B Vaccine  Aged Out   Mammogram  Discontinued   Colon Cancer Screening  Discontinued    Advanced directives: (Copy Requested) Please bring a copy of your health care power of attorney and living will to the office to be added to your chart at your  convenience. You can mail to [facility] [address] [place], [place] [address] or email to [email or web address] Advance Care Planning is important because it:  [x]  Makes sure you receive the medical care that is consistent with your values, goals, and preferences  [x]  It provides guidance to your family and loved ones and reduces their decisional burden about whether or not they are making the right decisions based on your wishes.  Follow the link provided in your after visit summary or read over the paperwork we have mailed to you to help you started getting your Advance Directives in place. If you need assistance in completing these, please reach out to us  so that we can help you!  See attachments for Preventive Care and Fall Prevention Tips.

## 2023-08-08 ENCOUNTER — Other Ambulatory Visit (HOSPITAL_COMMUNITY): Payer: Self-pay | Admitting: Internal Medicine

## 2023-08-08 DIAGNOSIS — E042 Nontoxic multinodular goiter: Secondary | ICD-10-CM

## 2023-08-08 DIAGNOSIS — E0590 Thyrotoxicosis, unspecified without thyrotoxic crisis or storm: Secondary | ICD-10-CM

## 2023-08-08 NOTE — Telephone Encounter (Signed)
 Copied from CRM [id number]. Topic: Clinical - Medical Advice >> Aug 08, 2023 11:31 AM Thersia BROCKS wrote: Reason for CRM: [facility] called in regarding order for the medication   donepezil  (ARICEPT ) 10 MG tablet ; stated they need it faxed to them at  [phone]

## 2023-08-08 NOTE — Telephone Encounter (Signed)
RX faxed to number provided.

## 2023-08-11 ENCOUNTER — Encounter (HOSPITAL_COMMUNITY): Payer: Self-pay

## 2023-08-11 ENCOUNTER — Encounter (HOSPITAL_COMMUNITY)
Admission: RE | Admit: 2023-08-11 | Discharge: 2023-08-11 | Disposition: A | Discharge status: Home / Self Care | Source: Ambulatory Visit | Attending: Internal Medicine | Admitting: Internal Medicine

## 2023-08-11 DIAGNOSIS — E0590 Thyrotoxicosis, unspecified without thyrotoxic crisis or storm: Secondary | ICD-10-CM | POA: Diagnosis present

## 2023-08-11 DIAGNOSIS — E042 Nontoxic multinodular goiter: Secondary | ICD-10-CM | POA: Diagnosis present

## 2023-08-11 MED ORDER — SODIUM IODIDE I-123 7.4 MBQ CAPS
439.3000 | ORAL_CAPSULE | Freq: Once | ORAL | Status: AC
  Administered 2023-08-11: 439.3 via ORAL

## 2023-08-12 ENCOUNTER — Encounter (HOSPITAL_COMMUNITY)
Admission: RE | Admit: 2023-08-12 | Discharge: 2023-08-12 | Disposition: A | Discharge status: Home / Self Care | Source: Ambulatory Visit | Attending: Internal Medicine | Admitting: Internal Medicine

## 2023-08-12 DIAGNOSIS — E042 Nontoxic multinodular goiter: Secondary | ICD-10-CM | POA: Insufficient documentation

## 2023-08-12 DIAGNOSIS — E0590 Thyrotoxicosis, unspecified without thyrotoxic crisis or storm: Secondary | ICD-10-CM | POA: Insufficient documentation

## 2023-08-20 ENCOUNTER — Telehealth: Payer: Self-pay

## 2023-08-20 NOTE — Telephone Encounter (Signed)
 Physician orders signed and faxed back to [facility] at [phone]. Form sent for scanning.

## 2023-08-27 ENCOUNTER — Ambulatory Visit: Admitting: Cardiovascular Disease

## 2023-09-01 ENCOUNTER — Telehealth: Payer: Self-pay | Admitting: Internal Medicine

## 2023-09-01 ENCOUNTER — Telehealth: Payer: Self-pay

## 2023-09-01 NOTE — Telephone Encounter (Signed)
 Received, will have PCP sign again tomorrow.

## 2023-09-01 NOTE — Telephone Encounter (Signed)
 Copied from CRM [id number]. Topic: General - Other >> Sep 01, 2023  2:40 PM Berneda FALCON wrote: Reason for CRM: Tameka from [facility] states that the order we sent back for the order summary report is actually missing page 1. On our end, it shows as page 2 but it is actually page 1 and it should not be blank. She is refaxing it for a third time. Could we please have someone look out for this and fax it back to her asap, please?

## 2023-09-01 NOTE — Telephone Encounter (Signed)
 Copied from CRM #[id number]. Topic: General - Other >> Sep 01, 2023 12:56 PM Thersia BROCKS wrote: Reason for CRM: [facility] called in would like for the medication review, order summary report to be re faxed  to [phone]

## 2023-09-01 NOTE — Telephone Encounter (Signed)
Form refaxed to number provided.

## 2023-09-02 NOTE — Telephone Encounter (Signed)
 Form signed and faxed back to [facility] at [phone]. Form sent for scanning.

## 2023-09-02 NOTE — Telephone Encounter (Signed)
 Received fax confirmation

## 2023-09-09 NOTE — Progress Notes (Unsigned)
 Electrophysiology Office Note:    Date:  09/10/2023   ID:  [name], DOB 1945-10-07, MRN [id number]  PCP:  Amon Aloysius BRAVO, MD   [facility] Providers Cardiologist:  None     Referring MD: Amon Aloysius BRAVO, MD   History of Present Illness:    [name] is a 78 y.o. female with a medical history significant for atrial fibrillation, frequent PVCs hypertension, SIADH, IBS, referred for management of atrial fibrillation and PVCs.      Discussed the use of AI scribe software for clinical note transcription with the patient, who gave verbal consent to proceed.  History of Present Illness The patient, with atrial fibrillation and premature ventricular contractions, presents with multiple arrhythmia issues. She is accompanied by her daughter. She was referred by [facility] for multiple arrhythmia issues including PVCs and atrial fibrillation.  A week after the ER visit, she experienced a sudden episode of heart racing while dining out, with her heart rate reaching 179-180 bpm as measured by an Apple watch. She felt faint and lightheaded during this episode. A fellow diner with atrial fibrillation used a portable EKG to confirm AFib twice before she returned to normal sinus rhythm.  Her daughter reports that during a recent visit to her primary care physician, some abnormal rhythms were noted, but it was not atrial fibrillation. The daughter is concerned about the possibility of undetected episodes due to her memory issues.  She has not been on any blood thinners and has not worn a heart monitor previously.         Today, she has no complaints. She presents for placement of a loop recorder.  EKGs/Labs/Other Studies Reviewed Today:      EKG:   EKG Interpretation Date/Time:  Wednesday September 10 2023 08:41:39 EDT Ventricular Rate:  58 PR Interval:  168 QRS Duration:  96 QT Interval:  474 QTC Calculation: 465 R Axis:   -60  Text Interpretation: Sinus bradycardia with  marked sinus arrhythmia Left anterior fascicular block Minimal voltage criteria for LVH, may be normal variant ( Cornell product ) When compared with ECG of 14-Jul-2023 11:35, No significant change was found Confirmed by Nancey Scotts [phone]) on 09/10/2023 1:00:27 PM     Physical Exam:    VS:  BP (!) 140/88 (BP Location: Left Arm, Patient Position: Sitting, Cuff Size: Normal)   Pulse (!) 58   Ht 5' 4.5 (1.638 m)   Wt 159 lb (72.1 kg)   SpO2 96%   BMI 26.87 kg/m     Wt Readings from Last 3 Encounters:  09/10/23 159 lb (72.1 kg)  07/29/23 167 lb (75.8 kg)  07/14/23 167 lb 9.6 oz (76 kg)     GEN: Well nourished, well developed in no acute distress CARDIAC: RRR, no murmurs, rubs, gallops RESPIRATORY:  Normal work of breathing MUSCULOSKELETAL: no edema    ASSESSMENT & PLAN:     Atrial fibrillation -- reported This has not been documented on any EKGs available to us  I think loop recorder is warranted.  Episodes are apparently too rare to reliably detect with a wearable monitor I explained the loop recorder placement procedure, risk of infection, bleeding, discomfort.  I explained the monitoring process with the associated fee and she is willing to proceed. Upon documentation of abnormal rhythm, will obtain echocardiogram  PVCs Likely cause of occasional palpitations Will monitor with loop recorder  Secondary hypercoagulable state Awaiting formal documentation of atrial fibrillation prior to starting anticoagulation  I  explained the indication for the loop placement.  I explained risks of infection, minor bleeding, discomfort.  I explained the monitoring process with the associated feet.  She would like to proceed.     PREPROCEDURE DIAGNOSIS:  Atrial fibrillation    POSTPROCEDURE DIAGNOSIS: Atrial fibrillation     PROCEDURES:   1. Implantable loop recorder implantation    INTRODUCTION:  Anniya Whiters Keisling presents with a history of atrial fibrillation The costs of loop  recorder monitoring have been discussed with the patient.    DESCRIPTION OF PROCEDURE:  Informed written consent was obtained.  A timeout was performed. The patient required no sedation for the procedure today. The patients left chest was prepped and draped in the usual sterile fashion. The skin overlying the left parasternal region was infiltrated with lidocaine  for local analgesia.  A 0.5-cm incision was made over the left parasternal region over the 3rd intercostal space.  A subcutaneous ILR pocket was fashioned using a combination of sharp and blunt dissection.  A Medtronic Reveal LINQ 2 implantable loop recorder ([id number]) was then placed into the pocket  R waves were very prominent and measured >0.34mV.  Steri- Strips and a sterile dressing were then applied.  There were no early apparent complications.     CONCLUSIONS:   1. Successful implantation of a implantable loop recorder for a history of possible atrial fibrillation  2. No early apparent complications.   Eulas FORBES Furbish, MD  Cardiac Electrophysiology     Signed, Eulas FORBES Furbish, MD  09/10/2023 1:00 PM    [facility]

## 2023-09-10 ENCOUNTER — Ambulatory Visit: Attending: Cardiovascular Disease | Admitting: Cardiovascular Disease

## 2023-09-10 ENCOUNTER — Encounter: Payer: Self-pay | Admitting: Cardiovascular Disease

## 2023-09-10 VITALS — BP 140/88 | HR 58 | Ht 64.5 in | Wt 159.0 lb

## 2023-09-10 DIAGNOSIS — I493 Ventricular premature depolarization: Secondary | ICD-10-CM

## 2023-09-10 DIAGNOSIS — I4891 Unspecified atrial fibrillation: Secondary | ICD-10-CM

## 2023-09-10 NOTE — Patient Instructions (Signed)
 Medication Instructions:  Your physician recommends that you continue on your current medications as directed. Please refer to the Current Medication list given to you today.  Labwork: None ordered.  Testing/Procedures: None ordered.  Follow-Up:  Your physician wants you to follow-up in: one year.  You will receive a reminder letter in the mail two months in advance. If you don't receive a letter, please call our office to schedule the follow-up appointment.    Implantable Loop Recorder Placement, Care After This sheet gives you information about how to care for yourself after your procedure. Your health care provider may also give you more specific instructions. If you have problems or questions, contact your health care provider. What can I expect after the procedure? After the procedure, it is common to have: Soreness or discomfort near the incision. Some swelling or bruising near the incision.  Follow these instructions at home: Incision care  Monitor your cardiac device site for redness, swelling, and drainage. Call the device clinic at [phone] if you experience these symptoms or fever/chills.  Keep the large square bandage on your site for 24 hours and then you may remove it yourself. Keep the steri-strips underneath in place.   You may shower after 72 hours / 3 days from your procedure with the steri-strips in place. They will usually fall off on their own, or may be removed after 10 days. Pat dry.   Avoid lotions, ointments, or perfumes over your incision until it is well-healed.  Please do not submerge in water  until your site is completely healed.   Your device is MRI compatible.   Remote monitoring is used to monitor your cardiac device from home. This monitoring is scheduled every month by our office. It allows us  to keep an eye on the function of your device to ensure it is working properly.  If your wound site starts to bleed apply pressure.    For help  with the monitor please call Medtronic Monitor Support Specialist directly at [phone].    If you have any questions/concerns please call the device clinic at [phone].  Activity  Return to your normal activities.  General instructions Follow instructions from your health care provider about how to manage your implantable loop recorder and transmit the information. Learn how to activate a recording if this is necessary for your type of device. You may go through a metal detection gate, and you may let someone hold a metal detector over your chest. Show your ID card if needed. Do not have an MRI unless you check with your health care provider first. Take over-the-counter and prescription medicines only as told by your health care provider. Keep all follow-up visits as told by your health care provider. This is important. Contact a health care provider if: You have redness, swelling, or pain around your incision. You have a fever. You have pain that is not relieved by your pain medicine. You have triggered your device because of fainting (syncope) or because of a heartbeat that feels like it is racing, slow, fluttering, or skipping (palpitations). Get help right away if you have: Chest pain. Difficulty breathing. Summary After the procedure, it is common to have soreness or discomfort near the incision. Change your dressing as told by your health care provider. Follow instructions from your health care provider about how to manage your implantable loop recorder and transmit the information. Keep all follow-up visits as told by your health care provider. This is important. This information is not intended  to replace advice given to you by your health care provider. Make sure you discuss any questions you have with your health care provider. Document Released: 12/05/2014 Document Revised: 02/08/2017 Document Reviewed: 02/08/2017 Elsevier Patient Education  2020 [employer].  explant

## 2023-09-19 ENCOUNTER — Ambulatory Visit: Admitting: Internal Medicine

## 2023-09-23 ENCOUNTER — Ambulatory Visit: Admitting: Obstetrics

## 2023-09-26 ENCOUNTER — Telehealth: Payer: Self-pay

## 2023-09-26 ENCOUNTER — Ambulatory Visit: Admitting: Obstetrics

## 2023-09-26 NOTE — Telephone Encounter (Signed)
 Nursing Home cert reviewed, signed and faxed back to [facility] [phone]. Forms sent for scanning.

## 2023-09-29 ENCOUNTER — Ambulatory Visit: Admitting: Obstetrics

## 2023-10-06 ENCOUNTER — Encounter: Payer: Self-pay | Admitting: Internal Medicine

## 2023-10-06 ENCOUNTER — Ambulatory Visit (INDEPENDENT_AMBULATORY_CARE_PROVIDER_SITE_OTHER): Admitting: Internal Medicine

## 2023-10-06 VITALS — BP 126/66 | HR 56 | Temp 97.8°F | Resp 16 | Ht 64.5 in | Wt 174.4 lb

## 2023-10-06 DIAGNOSIS — E785 Hyperlipidemia, unspecified: Secondary | ICD-10-CM | POA: Diagnosis not present

## 2023-10-06 DIAGNOSIS — F03B0 Unspecified dementia, moderate, without behavioral disturbance, psychotic disturbance, mood disturbance, and anxiety: Secondary | ICD-10-CM | POA: Diagnosis not present

## 2023-10-06 DIAGNOSIS — I10 Essential (primary) hypertension: Secondary | ICD-10-CM

## 2023-10-06 DIAGNOSIS — E538 Deficiency of other specified B group vitamins: Secondary | ICD-10-CM | POA: Diagnosis not present

## 2023-10-06 DIAGNOSIS — E1142 Type 2 diabetes mellitus with diabetic polyneuropathy: Secondary | ICD-10-CM

## 2023-10-06 NOTE — Progress Notes (Signed)
 Subjective:    Patient ID: [name], female    DOB: [birth year], 78 y.o.   MRN: [id number]  DOS:  10/06/2023 Type of visit - description: Follow-up here with her daughter Lavanda  Chronic medical problems addressed. Doing well. No recent falls. Discussed the use of AI scribe software for clinical note transcription with the patient, who gave verbal consent to proceed.  History of Present Illness [name] is a 78 year old female with dementia and atrial fibrillation who presents for follow-up of her cardiac monitor and thyroid  issues.  Cognitive decline and dementia - Progressive memory impairment - Resides in assisted living facility since June 2022 with noted benefit - No longer drives - No recent falls or aggressive behavior - Occasional wandering without dangerous behavior - Current medications for dementia include Aricept  and Namenda   Cardiac arrhythmia monitoring - Implantable cardiac monitor in place to document arrhythmia episodes due to underlying atrial fibrillation and dementia - One or two arrhythmia episodes recorded since monitor placement - Monitor expected to last five years  Thyroid  evaluation - Thyroid  imaging performed - Pending nuclear injection for further evaluation, with scheduling affected by living situation - Daughter coordinating follow-up  Blood pressure and cardiovascular risk management - Blood pressure monitored regularly at assisted living facility - Current cardiovascular medications include aspirin , losartan  25 mg, atenolol  25 mg, Lipitor 40 mg, and Farxiga  Vaccination status - Scheduled for influenza and COVID-19 vaccinations    Review of Systems See above   Past Medical History:  Diagnosis Date   Adenomatous colon polyp 03/1988   Anemia    borderline   Anxiety    Arthritis    R shoulder - degenerative    Chest pain     Sept 2011: stress test neg   Depression    sees Dr.Cotle   Diabetes mellitus    dr Faythe    Diverticulosis    Fatty liver    Increased LFTs, saw GI 06/2011, likely from fatty liver    Fuchs' corneal dystrophy    GERD (gastroesophageal reflux disease)    History of hiatal hernia    Hyperlipemia    Hypertension    IBS (irritable bowel syndrome)    and dyspepsia   Memory impairment 08/2014   mild cognitive impairment vs. mild dementia, reommended reinstating of cholinesterase inhibitor    Osteopenia    dexa 06/2007 and 10/11 Rx CA vitamin d    Osteopenia    RLS (restless legs syndrome)    h/o- no longer using Requip   Sleep apnea    Mouth guard use at home   Thyroid  nodule     Past Surgical History:  Procedure Laterality Date   BREAST BIOPSY Left 10/11/2015   fibroadenoma w/ calcifications, no evidence of malignancy, recommend mammogram repeat-6 mnths   COLONOSCOPY  04/15/2014   JOINT REPLACEMENT     POLYPECTOMY     REVERSE SHOULDER ARTHROPLASTY Right 04/21/2014   Procedure: REVERSE SHOULDER ARTHROPLASTY;  Surgeon: Eva Herring, MD;  Location: [facility] OR;  Service: Orthopedics;  Laterality: Right;  Right reverse total shoulder replacement   TONSILLECTOMY     UTERINE FIBROID EMBOLIZATION     90s    Current Outpatient Medications  Medication Instructions   ACCU-CHEK FASTCLIX LANCETS MISC Daily   acetaminophen  (TYLENOL ) 650 mg, Every 6 hours PRN   aspirin  EC 81 mg, Daily   atenolol  (TENORMIN ) 25 mg, Daily   atorvastatin  (LIPITOR) 40 mg, Oral, Daily   Carboxymethylcellulose Sod PF (REFRESH  CELLUVISC) 1 % GEL 1 drop, Daily at bedtime   cyanocobalamin  (VITAMIN B12) 1,000 mcg, Oral, Daily   dapagliflozin propanediol (FARXIGA) 5 mg, Daily   donepezil  (ARICEPT ) 20 mg, Oral, Daily at bedtime   gabapentin  (NEURONTIN ) 100 mg, Oral, Daily at bedtime   Gemtesa  75 mg, Oral, Daily   Glycerin-Polysorbate 80 (REFRESH DRY EYE THERAPY OP) Refresh Dry Eye Therapy   Iron -Vitamin C  65-125 MG TABS 1 tablet, Daily   losartan  (COZAAR ) 25 mg, Oral, Daily   memantine  (NAMENDA ) 10 mg, Oral,  2 times daily   metFORMIN  (GLUCOPHAGE -XR) 500 mg, Every evening   Multiple Vitamins-Minerals (CENTRUM SILVER  ULTRA WOMENS) TABS 1 tablet, Oral, Daily   ONE TOUCH ULTRA TEST test strip USE TO CHECK BLOOD SUGAR EVERY DAY   pantoprazole  (PROTONIX ) 40 mg, Oral, Every morning   psyllium (METAMUCIL SMOOTH TEXTURE) 58.6 % powder 1 packet, Oral, Daily, Reduce to 1/2 dose if you experience any abdominal bloating or worsening bowel leakage   sertraline  (ZOLOFT ) 100 mg, Oral, Daily   sodium chloride  (MURO 128) 5 % ophthalmic solution 1 drop, Every 4 hours PRN   sodium fluoride (FLUORISHIELD) 1.1 % GEL dental gel 1 application , At bedtime PRN       Objective:   Physical Exam BP 126/66   Pulse (!) 56   Temp 97.8 F (36.6 C) (Oral)   Resp 16   Ht 5' 4.5 (1.638 m)   Wt 174 lb 6 oz (79.1 kg)   SpO2 98%   BMI 29.47 kg/m  General:   Well developed, NAD, BMI noted. HEENT:  Normocephalic . Face symmetric, atraumatic Lungs:  CTA B Normal respiratory effort, no intercostal retractions, no accessory muscle use. Heart: RRR,  no murmur.  Lower extremities: no pretibial edema bilaterally  Skin: Not pale. Not jaundice Neurologic:  alert & pleasantly demented.  Speech normal, gait assisted by a cane  psych--  Cognition and judgment appear intact.  Cooperative with normal attention span and concentration.  Behavior appropriate. No anxious or depressed appearing.      Assessment   ASSESSMENT DM Dr. Faythe Neuropathy, preulcerative calluses (see OV at podiatry 05/09/2022) HTN Hyperlipidemia H/o SIADH Depression seen elsewhere Dementia: Dr Charlyne  OSA -- mild, also told RLS (Rx requip); saw Dr Corrie 2014, no CPAP GI: --IBS, colon polyps, diverticulosis, hiatal hernia --Chronic constipation likely part of her IBS syndrome  --Fatty liver >>> GI eval 2013 --iron  fec anemia:  cscope 04-2014 : 2 polyps; + hemocult @ GI office 10-2014: EGD done (-), bx neg Osteopenia: T score -1.1  2009 ,  osteopenia again per dexa 05-2013, t score -1.8 (10-09-17), scanned, rx ca-vit d B12 deficiency  RLS MSK: --DJD frozen shoulder Dizziness: chronic, carotid us  2016 neg, saw cards-- not likely CV related; saw neuro DR Charlyne 02-2015  Thyroid  nodules: Incidental   by Carotid us , BX 11-2014: Atypical findings, Bethesda III, f/u  Endocrine RLL Lung nodule: Stable per CT 09-2016, no need for further B/B incontinence:  uses diapers    Assessment & Plan DM with neuropathy: Per Endo Dementia Dementia with declining memory, no aggressive behavior, and some wandering likely due to unfamiliarity with the new assisted living environment. - Continue Aricept  and Namenda . - Ensure safety measures are in place at the assisted living facility. Arrhythmia under evaluation, saw cardiology, they noted no A-fib has been formally documented, they rec a loop recorder Will evaluate thyroid  function as it may be contributing to arrhythmia. TSH suppression: Last TSH 06/16/2023 is slightly  suppressed, results were sent to Dr. Faythe, endocrinology.  Has a follow-up with him October 7.   Hypertension Hypertension managed with losartan  and atenolol . Blood pressure readings from the assisted living facility are well-controlled.  No change Hyperlipidemia Hyperlipidemia managed with Lipitor. Previous cholesterol panel was good, recheck FLP.  Continue Lipitor. B12 deficiency: Last level satisfactory, on supplements. Vaccine advised: To get vaccines at the facility she lives at Social: Since LOV, moved to the assisted living side of her residency. RTC 5 months

## 2023-10-06 NOTE — Patient Instructions (Signed)
 GO TO THE LAB :  Get the blood work    Then, go to the front desk for the checkout Please make an appointment for a follow-up in 4 to 5 months   Check the  blood pressure regularly Blood pressure goal:  between 110/65 and  135/85. If it is consistently higher or lower, let me know

## 2023-10-07 DIAGNOSIS — E1142 Type 2 diabetes mellitus with diabetic polyneuropathy: Secondary | ICD-10-CM | POA: Insufficient documentation

## 2023-10-07 LAB — BASIC METABOLIC PANEL WITH GFR
BUN: 17 mg/dL (ref 6–23)
CO2: 26 meq/L (ref 19–32)
Calcium: 9.5 mg/dL (ref 8.4–10.5)
Chloride: 99 meq/L (ref 96–112)
Creatinine, Ser: 0.95 mg/dL (ref 0.40–1.20)
GFR: 57.33 mL/min — ABNORMAL LOW (ref >60.00)
Glucose, Bld: 184 mg/dL — ABNORMAL HIGH (ref 70–99)
Potassium: 3.9 meq/L (ref 3.5–5.1)
Sodium: 136 meq/L (ref 135–145)

## 2023-10-07 LAB — LIPID PANEL
Cholesterol: 119 mg/dL (ref 0–200)
HDL: 43.5 mg/dL (ref >39.00)
LDL Cholesterol: 34 mg/dL (ref 0–99)
NonHDL: 75.32
Total CHOL/HDL Ratio: 3
Triglycerides: 206 mg/dL — ABNORMAL HIGH (ref 0.0–149.0)
VLDL: 41.2 mg/dL — ABNORMAL HIGH (ref 0.0–40.0)

## 2023-10-07 NOTE — Assessment & Plan Note (Addendum)
 DM with neuropathy: Per Endo Dementia Dementia with declining memory, no aggressive behavior, and some wandering likely due to unfamiliarity with the new assisted living environment. - Continue Aricept  and Namenda . - Ensure safety measures are in place at the assisted living facility. Arrhythmia under evaluation, saw cardiology, they noted no A-fib has been formally documented, they rec a loop recorder Will evaluate thyroid  function as it may be contributing to arrhythmia. TSH suppression: Last TSH 06/16/2023 is slightly suppressed, results were sent to Dr. Faythe, endocrinology.  Has a follow-up with him October 7.   Hypertension Hypertension managed with losartan  and atenolol . Blood pressure readings from the assisted living facility are well-controlled.  No change Hyperlipidemia Hyperlipidemia managed with Lipitor. Previous cholesterol panel was good, recheck FLP.  Continue Lipitor. B12 deficiency: Last level satisfactory, on supplements. Vaccine advised: To get vaccines at the facility she lives at Social: Since LOV, moved to the assisted living side of her residency. RTC 5 months

## 2023-10-09 ENCOUNTER — Ambulatory Visit: Payer: Self-pay | Admitting: Internal Medicine

## 2023-10-13 ENCOUNTER — Ambulatory Visit

## 2023-10-13 DIAGNOSIS — I4891 Unspecified atrial fibrillation: Secondary | ICD-10-CM | POA: Diagnosis not present

## 2023-10-13 LAB — CUP PACEART REMOTE DEVICE CHECK
Date Time Interrogation Session: 20251004180909
Implantable Pulse Generator Implant Date: 20250903

## 2023-10-14 NOTE — Progress Notes (Signed)
 Remote Loop Recorder Transmission

## 2023-10-20 ENCOUNTER — Ambulatory Visit: Payer: Self-pay | Admitting: Cardiovascular Disease

## 2023-10-20 ENCOUNTER — Telehealth: Payer: Self-pay

## 2023-10-20 NOTE — Telephone Encounter (Signed)
 Nursing forms completed and faxed back to [facility] at [phone]. Form sent for scanning.

## 2023-11-13 ENCOUNTER — Ambulatory Visit: Payer: Self-pay | Admitting: Cardiovascular Disease

## 2023-11-13 ENCOUNTER — Ambulatory Visit (INDEPENDENT_AMBULATORY_CARE_PROVIDER_SITE_OTHER)

## 2023-11-13 DIAGNOSIS — I4891 Unspecified atrial fibrillation: Secondary | ICD-10-CM

## 2023-11-13 LAB — CUP PACEART REMOTE DEVICE CHECK
Date Time Interrogation Session: 20251106001028
Implantable Pulse Generator Implant Date: 20250903

## 2023-11-17 NOTE — Progress Notes (Signed)
 Remote Loop Recorder Transmission

## 2023-11-27 ENCOUNTER — Telehealth: Payer: Self-pay

## 2023-11-27 NOTE — Telephone Encounter (Signed)
 LMOM for [name]- informing her that we did receive paperwork x2, PCP out of office from 11/10/23 to mid December 2025, his covering provider Kenney Roys will be back in the office on 12/02/23 and 12/03/23- forms will be signed at that time and faxed back.

## 2023-11-27 NOTE — Telephone Encounter (Signed)
 Copied from CRM #[id number]. Topic: Clinical - Medication Question >> Nov 27, 2023  1:03 PM Tysheama G wrote: Reason for CRM: [facility] regarding patient. They sent over medication order for paper 2weeks ago and again this week. Need Dr.Paz to sign off on it and send it back to them asap please. Callback number [phone] ext [phone] fax [phone]

## 2023-12-02 NOTE — Telephone Encounter (Signed)
 Forms signed and faxed back to [facility] at [phone]. Forms sent for scanning.

## 2023-12-14 ENCOUNTER — Ambulatory Visit

## 2023-12-15 ENCOUNTER — Encounter

## 2023-12-16 LAB — CUP PACEART REMOTE DEVICE CHECK
Date Time Interrogation Session: 20251206232734
Implantable Pulse Generator Implant Date: 20250903

## 2023-12-23 NOTE — Progress Notes (Signed)
 Remote Loop Recorder Transmission

## 2023-12-26 ENCOUNTER — Ambulatory Visit: Payer: Self-pay | Admitting: Cardiovascular Disease

## 2024-01-12 ENCOUNTER — Telehealth: Payer: Self-pay

## 2024-01-12 NOTE — Telephone Encounter (Signed)
 Incident report form signed and faxed back to [facility] at [phone]. Form sent for scanning.

## 2024-01-14 ENCOUNTER — Ambulatory Visit

## 2024-01-14 DIAGNOSIS — I4891 Unspecified atrial fibrillation: Secondary | ICD-10-CM | POA: Diagnosis not present

## 2024-01-15 ENCOUNTER — Encounter

## 2024-01-15 LAB — CUP PACEART REMOTE DEVICE CHECK
Date Time Interrogation Session: 20260106233744
Implantable Pulse Generator Implant Date: 20250903

## 2024-01-17 ENCOUNTER — Ambulatory Visit: Payer: Self-pay | Admitting: Cardiovascular Disease

## 2024-01-19 ENCOUNTER — Encounter: Payer: Self-pay | Admitting: *Deleted

## 2024-01-19 NOTE — Progress Notes (Signed)
 Remote Loop Recorder Transmission

## 2024-01-20 ENCOUNTER — Telehealth: Payer: Self-pay

## 2024-01-20 NOTE — Telephone Encounter (Signed)
 Nursing home forms for [facility] signed and faxed back to [phone]. Form sent for scanning.

## 2024-02-09 ENCOUNTER — Ambulatory Visit: Admitting: Internal Medicine

## 2024-02-14 ENCOUNTER — Ambulatory Visit

## 2024-02-16 ENCOUNTER — Encounter

## 2024-03-16 ENCOUNTER — Ambulatory Visit

## 2024-03-17 ENCOUNTER — Ambulatory Visit: Admitting: Internal Medicine

## 2024-04-16 ENCOUNTER — Ambulatory Visit

## 2024-05-17 ENCOUNTER — Ambulatory Visit

## 2024-06-17 ENCOUNTER — Ambulatory Visit

## 2024-07-18 ENCOUNTER — Ambulatory Visit

## 2024-08-03 ENCOUNTER — Ambulatory Visit

## 2024-08-18 ENCOUNTER — Ambulatory Visit

## 2024-09-18 ENCOUNTER — Ambulatory Visit

## 2024-10-19 ENCOUNTER — Ambulatory Visit

## 2024-11-19 ENCOUNTER — Ambulatory Visit

## 2024-12-20 ENCOUNTER — Ambulatory Visit

## 2025-01-20 ENCOUNTER — Ambulatory Visit
